# Patient Record
Sex: Female | Born: 1955 | Race: Black or African American | Hispanic: No | Marital: Single | Smoking: Never smoker
Health system: Southern US, Community
[De-identification: ages and names within clinical notes are randomized; demographics above are authoritative.]

## PROBLEM LIST (undated history)

## (undated) DIAGNOSIS — J189 Pneumonia, unspecified organism: Secondary | ICD-10-CM

## (undated) DIAGNOSIS — D869 Sarcoidosis, unspecified: Secondary | ICD-10-CM

## (undated) DIAGNOSIS — F329 Major depressive disorder, single episode, unspecified: Secondary | ICD-10-CM

## (undated) DIAGNOSIS — G43709 Chronic migraine without aura, not intractable, without status migrainosus: Secondary | ICD-10-CM

## (undated) DIAGNOSIS — IMO0002 Reserved for concepts with insufficient information to code with codable children: Secondary | ICD-10-CM

## (undated) DIAGNOSIS — R569 Unspecified convulsions: Secondary | ICD-10-CM

## (undated) DIAGNOSIS — G4730 Sleep apnea, unspecified: Secondary | ICD-10-CM

## (undated) DIAGNOSIS — G8918 Other acute postprocedural pain: Secondary | ICD-10-CM

## (undated) DIAGNOSIS — M1990 Unspecified osteoarthritis, unspecified site: Secondary | ICD-10-CM

## (undated) DIAGNOSIS — T7840XA Allergy, unspecified, initial encounter: Secondary | ICD-10-CM

## (undated) DIAGNOSIS — G894 Chronic pain syndrome: Secondary | ICD-10-CM

## (undated) DIAGNOSIS — E7800 Pure hypercholesterolemia, unspecified: Secondary | ICD-10-CM

## (undated) DIAGNOSIS — J45909 Unspecified asthma, uncomplicated: Secondary | ICD-10-CM

## (undated) DIAGNOSIS — K5900 Constipation, unspecified: Secondary | ICD-10-CM

## (undated) DIAGNOSIS — I10 Essential (primary) hypertension: Secondary | ICD-10-CM

## (undated) DIAGNOSIS — J9610 Chronic respiratory failure, unspecified whether with hypoxia or hypercapnia: Secondary | ICD-10-CM

## (undated) DIAGNOSIS — J449 Chronic obstructive pulmonary disease, unspecified: Secondary | ICD-10-CM

## (undated) DIAGNOSIS — F32A Depression, unspecified: Secondary | ICD-10-CM

## (undated) DIAGNOSIS — I671 Cerebral aneurysm, nonruptured: Secondary | ICD-10-CM

## (undated) DIAGNOSIS — R079 Chest pain, unspecified: Secondary | ICD-10-CM

## (undated) HISTORY — DX: Unspecified osteoarthritis, unspecified site: M19.90

## (undated) HISTORY — DX: Pneumonia, unspecified organism: J18.9

## (undated) HISTORY — PX: BACK SURGERY: SHX140

## (undated) HISTORY — PX: CHOLECYSTECTOMY: SHX55

## (undated) HISTORY — DX: Chronic migraine without aura, not intractable, without status migrainosus: G43.709

## (undated) HISTORY — DX: Unspecified convulsions: R56.9

## (undated) HISTORY — DX: Chest pain, unspecified: R07.9

## (undated) HISTORY — PX: SPINAL FUSION: SHX223

## (undated) HISTORY — DX: Allergy, unspecified, initial encounter: T78.40XA

## (undated) HISTORY — DX: Essential (primary) hypertension: I10

## (undated) HISTORY — DX: Sarcoidosis, unspecified: D86.9

## (undated) HISTORY — DX: Chronic respiratory failure, unspecified whether with hypoxia or hypercapnia: J96.10

## (undated) HISTORY — DX: Reserved for concepts with insufficient information to code with codable children: IMO0002

## (undated) HISTORY — DX: Chronic obstructive pulmonary disease, unspecified: J44.9

## (undated) HISTORY — DX: Cerebral aneurysm, nonruptured: I67.1

## (undated) HISTORY — PX: ABDOMINAL HYSTERECTOMY: SHX81

---
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
## 2015-06-24 DIAGNOSIS — M170 Bilateral primary osteoarthritis of knee: Secondary | ICD-10-CM | POA: Insufficient documentation

## 2015-07-14 DIAGNOSIS — G4700 Insomnia, unspecified: Secondary | ICD-10-CM | POA: Insufficient documentation

## 2015-07-14 DIAGNOSIS — G40909 Epilepsy, unspecified, not intractable, without status epilepticus: Secondary | ICD-10-CM | POA: Insufficient documentation

## 2015-09-17 DIAGNOSIS — M25511 Pain in right shoulder: Secondary | ICD-10-CM

## 2015-09-17 DIAGNOSIS — S46009A Unspecified injury of muscle(s) and tendon(s) of the rotator cuff of unspecified shoulder, initial encounter: Secondary | ICD-10-CM | POA: Insufficient documentation

## 2015-09-17 DIAGNOSIS — G8929 Other chronic pain: Secondary | ICD-10-CM | POA: Insufficient documentation

## 2015-10-05 DIAGNOSIS — M62838 Other muscle spasm: Secondary | ICD-10-CM | POA: Insufficient documentation

## 2015-10-05 DIAGNOSIS — M47817 Spondylosis without myelopathy or radiculopathy, lumbosacral region: Secondary | ICD-10-CM | POA: Insufficient documentation

## 2016-07-19 DIAGNOSIS — G250 Essential tremor: Secondary | ICD-10-CM | POA: Insufficient documentation

## 2016-08-02 DIAGNOSIS — K449 Diaphragmatic hernia without obstruction or gangrene: Secondary | ICD-10-CM | POA: Insufficient documentation

## 2016-08-02 DIAGNOSIS — K219 Gastro-esophageal reflux disease without esophagitis: Secondary | ICD-10-CM | POA: Insufficient documentation

## 2016-11-01 DIAGNOSIS — R079 Chest pain, unspecified: Secondary | ICD-10-CM | POA: Insufficient documentation

## 2016-11-01 HISTORY — DX: Chest pain, unspecified: R07.9

## 2017-01-26 DIAGNOSIS — J439 Emphysema, unspecified: Secondary | ICD-10-CM | POA: Insufficient documentation

## 2017-01-26 DIAGNOSIS — F411 Generalized anxiety disorder: Secondary | ICD-10-CM | POA: Insufficient documentation

## 2017-01-26 DIAGNOSIS — K589 Irritable bowel syndrome without diarrhea: Secondary | ICD-10-CM | POA: Insufficient documentation

## 2017-09-11 DIAGNOSIS — Z9989 Dependence on other enabling machines and devices: Secondary | ICD-10-CM

## 2017-09-11 DIAGNOSIS — G4733 Obstructive sleep apnea (adult) (pediatric): Secondary | ICD-10-CM | POA: Insufficient documentation

## 2017-10-22 DIAGNOSIS — I671 Cerebral aneurysm, nonruptured: Secondary | ICD-10-CM | POA: Insufficient documentation

## 2018-01-17 DIAGNOSIS — D869 Sarcoidosis, unspecified: Secondary | ICD-10-CM | POA: Insufficient documentation

## 2018-01-17 DIAGNOSIS — I671 Cerebral aneurysm, nonruptured: Secondary | ICD-10-CM | POA: Insufficient documentation

## 2018-01-17 DIAGNOSIS — J449 Chronic obstructive pulmonary disease, unspecified: Secondary | ICD-10-CM | POA: Insufficient documentation

## 2018-01-17 DIAGNOSIS — G43709 Chronic migraine without aura, not intractable, without status migrainosus: Secondary | ICD-10-CM | POA: Insufficient documentation

## 2018-01-17 DIAGNOSIS — I10 Essential (primary) hypertension: Secondary | ICD-10-CM | POA: Insufficient documentation

## 2018-01-17 DIAGNOSIS — G40909 Epilepsy, unspecified, not intractable, without status epilepticus: Secondary | ICD-10-CM | POA: Insufficient documentation

## 2018-01-17 DIAGNOSIS — F325 Major depressive disorder, single episode, in full remission: Secondary | ICD-10-CM | POA: Insufficient documentation

## 2018-01-17 DIAGNOSIS — E7800 Pure hypercholesterolemia, unspecified: Secondary | ICD-10-CM | POA: Insufficient documentation

## 2018-01-17 DIAGNOSIS — J9611 Chronic respiratory failure with hypoxia: Secondary | ICD-10-CM | POA: Insufficient documentation

## 2018-01-17 DIAGNOSIS — K5900 Constipation, unspecified: Secondary | ICD-10-CM | POA: Insufficient documentation

## 2018-01-30 DIAGNOSIS — Z8679 Personal history of other diseases of the circulatory system: Secondary | ICD-10-CM | POA: Insufficient documentation

## 2018-01-30 DIAGNOSIS — Z8669 Personal history of other diseases of the nervous system and sense organs: Secondary | ICD-10-CM | POA: Insufficient documentation

## 2018-01-30 DIAGNOSIS — Z8659 Personal history of other mental and behavioral disorders: Secondary | ICD-10-CM | POA: Insufficient documentation

## 2018-01-30 DIAGNOSIS — R519 Headache, unspecified: Secondary | ICD-10-CM | POA: Insufficient documentation

## 2018-01-30 DIAGNOSIS — R51 Headache: Secondary | ICD-10-CM

## 2018-02-01 ENCOUNTER — Other Ambulatory Visit: Payer: Self-pay | Admitting: Acute Care

## 2018-02-01 DIAGNOSIS — I2541 Coronary artery aneurysm: Secondary | ICD-10-CM

## 2018-02-01 DIAGNOSIS — G379 Demyelinating disease of central nervous system, unspecified: Secondary | ICD-10-CM

## 2018-02-04 ENCOUNTER — Ambulatory Visit: Payer: Self-pay

## 2018-02-05 ENCOUNTER — Other Ambulatory Visit: Payer: Self-pay | Admitting: Specialist

## 2018-02-05 DIAGNOSIS — D869 Sarcoidosis, unspecified: Secondary | ICD-10-CM

## 2018-02-07 ENCOUNTER — Ambulatory Visit
Admission: RE | Admit: 2018-02-07 | Discharge: 2018-02-07 | Disposition: A | Discharge status: Home / Self Care | Payer: Medicare Other | Source: Ambulatory Visit | Attending: Nurse Practitioner | Admitting: Nurse Practitioner

## 2018-02-07 ENCOUNTER — Ambulatory Visit: Payer: Medicare Other | Attending: Nurse Practitioner | Admitting: Nurse Practitioner

## 2018-02-07 ENCOUNTER — Other Ambulatory Visit: Payer: Self-pay | Admitting: Nurse Practitioner

## 2018-02-07 ENCOUNTER — Encounter: Payer: Self-pay | Admitting: Nurse Practitioner

## 2018-02-07 ENCOUNTER — Other Ambulatory Visit
Admission: RE | Admit: 2018-02-07 | Discharge: 2018-02-07 | Disposition: A | Discharge status: Home / Self Care | Payer: Medicare Other | Source: Ambulatory Visit | Attending: Nurse Practitioner | Admitting: Nurse Practitioner

## 2018-02-07 ENCOUNTER — Other Ambulatory Visit: Payer: Self-pay

## 2018-02-07 VITALS — BP 147/95 | HR 106 | Temp 98.9°F | Resp 18 | Ht 64.0 in | Wt 172.0 lb

## 2018-02-07 DIAGNOSIS — M5441 Lumbago with sciatica, right side: Principal | ICD-10-CM

## 2018-02-07 DIAGNOSIS — Z789 Other specified health status: Secondary | ICD-10-CM

## 2018-02-07 DIAGNOSIS — M899 Disorder of bone, unspecified: Secondary | ICD-10-CM

## 2018-02-07 DIAGNOSIS — M47896 Other spondylosis, lumbar region: Secondary | ICD-10-CM | POA: Insufficient documentation

## 2018-02-07 DIAGNOSIS — G40909 Epilepsy, unspecified, not intractable, without status epilepticus: Secondary | ICD-10-CM | POA: Insufficient documentation

## 2018-02-07 DIAGNOSIS — Z79899 Other long term (current) drug therapy: Secondary | ICD-10-CM

## 2018-02-07 DIAGNOSIS — D869 Sarcoidosis, unspecified: Secondary | ICD-10-CM | POA: Diagnosis not present

## 2018-02-07 DIAGNOSIS — G43709 Chronic migraine without aura, not intractable, without status migrainosus: Secondary | ICD-10-CM | POA: Insufficient documentation

## 2018-02-07 DIAGNOSIS — J449 Chronic obstructive pulmonary disease, unspecified: Secondary | ICD-10-CM | POA: Diagnosis not present

## 2018-02-07 DIAGNOSIS — K219 Gastro-esophageal reflux disease without esophagitis: Secondary | ICD-10-CM | POA: Insufficient documentation

## 2018-02-07 DIAGNOSIS — M79605 Pain in left leg: Secondary | ICD-10-CM | POA: Diagnosis not present

## 2018-02-07 DIAGNOSIS — M25562 Pain in left knee: Secondary | ICD-10-CM | POA: Diagnosis not present

## 2018-02-07 DIAGNOSIS — M5481 Occipital neuralgia: Secondary | ICD-10-CM | POA: Diagnosis not present

## 2018-02-07 DIAGNOSIS — M47816 Spondylosis without myelopathy or radiculopathy, lumbar region: Secondary | ICD-10-CM | POA: Diagnosis not present

## 2018-02-07 DIAGNOSIS — Z9981 Dependence on supplemental oxygen: Secondary | ICD-10-CM | POA: Diagnosis not present

## 2018-02-07 DIAGNOSIS — E7800 Pure hypercholesterolemia, unspecified: Secondary | ICD-10-CM | POA: Diagnosis not present

## 2018-02-07 DIAGNOSIS — M79604 Pain in right leg: Secondary | ICD-10-CM | POA: Diagnosis not present

## 2018-02-07 DIAGNOSIS — M25511 Pain in right shoulder: Secondary | ICD-10-CM

## 2018-02-07 DIAGNOSIS — G8929 Other chronic pain: Secondary | ICD-10-CM | POA: Insufficient documentation

## 2018-02-07 DIAGNOSIS — M533 Sacrococcygeal disorders, not elsewhere classified: Secondary | ICD-10-CM | POA: Insufficient documentation

## 2018-02-07 DIAGNOSIS — M62838 Other muscle spasm: Secondary | ICD-10-CM | POA: Diagnosis not present

## 2018-02-07 DIAGNOSIS — M25512 Pain in left shoulder: Secondary | ICD-10-CM

## 2018-02-07 DIAGNOSIS — G4733 Obstructive sleep apnea (adult) (pediatric): Secondary | ICD-10-CM | POA: Insufficient documentation

## 2018-02-07 DIAGNOSIS — J9611 Chronic respiratory failure with hypoxia: Secondary | ICD-10-CM | POA: Insufficient documentation

## 2018-02-07 DIAGNOSIS — Z885 Allergy status to narcotic agent status: Secondary | ICD-10-CM | POA: Insufficient documentation

## 2018-02-07 DIAGNOSIS — G894 Chronic pain syndrome: Secondary | ICD-10-CM | POA: Insufficient documentation

## 2018-02-07 DIAGNOSIS — M7592 Shoulder lesion, unspecified, left shoulder: Secondary | ICD-10-CM | POA: Insufficient documentation

## 2018-02-07 DIAGNOSIS — M542 Cervicalgia: Secondary | ICD-10-CM | POA: Diagnosis not present

## 2018-02-07 DIAGNOSIS — M5136 Other intervertebral disc degeneration, lumbar region: Secondary | ICD-10-CM | POA: Diagnosis not present

## 2018-02-07 DIAGNOSIS — G4700 Insomnia, unspecified: Secondary | ICD-10-CM | POA: Diagnosis not present

## 2018-02-07 DIAGNOSIS — Z79891 Long term (current) use of opiate analgesic: Secondary | ICD-10-CM | POA: Diagnosis not present

## 2018-02-07 DIAGNOSIS — Z7982 Long term (current) use of aspirin: Secondary | ICD-10-CM | POA: Insufficient documentation

## 2018-02-07 DIAGNOSIS — Z5181 Encounter for therapeutic drug level monitoring: Secondary | ICD-10-CM | POA: Insufficient documentation

## 2018-02-07 DIAGNOSIS — G250 Essential tremor: Secondary | ICD-10-CM | POA: Insufficient documentation

## 2018-02-07 DIAGNOSIS — Z880 Allergy status to penicillin: Secondary | ICD-10-CM | POA: Insufficient documentation

## 2018-02-07 DIAGNOSIS — K589 Irritable bowel syndrome without diarrhea: Secondary | ICD-10-CM | POA: Insufficient documentation

## 2018-02-07 DIAGNOSIS — F325 Major depressive disorder, single episode, in full remission: Secondary | ICD-10-CM | POA: Insufficient documentation

## 2018-02-07 DIAGNOSIS — M25561 Pain in right knee: Secondary | ICD-10-CM | POA: Diagnosis not present

## 2018-02-07 DIAGNOSIS — M170 Bilateral primary osteoarthritis of knee: Secondary | ICD-10-CM | POA: Insufficient documentation

## 2018-02-07 DIAGNOSIS — Z7951 Long term (current) use of inhaled steroids: Secondary | ICD-10-CM | POA: Insufficient documentation

## 2018-02-07 DIAGNOSIS — F411 Generalized anxiety disorder: Secondary | ICD-10-CM | POA: Insufficient documentation

## 2018-02-07 DIAGNOSIS — Z888 Allergy status to other drugs, medicaments and biological substances status: Secondary | ICD-10-CM | POA: Insufficient documentation

## 2018-02-07 LAB — COMPREHENSIVE METABOLIC PANEL
ALBUMIN: 4.8 g/dL (ref 3.5–5.0)
ALK PHOS: 63 U/L (ref 38–126)
ALT: 18 U/L (ref 14–54)
AST: 19 U/L (ref 15–41)
Anion gap: 11 (ref 5–15)
BILIRUBIN TOTAL: 0.5 mg/dL (ref 0.3–1.2)
BUN: 15 mg/dL (ref 6–20)
CO2: 25 mmol/L (ref 22–32)
CREATININE: 0.61 mg/dL (ref 0.44–1.00)
Calcium: 9.6 mg/dL (ref 8.9–10.3)
Chloride: 103 mmol/L (ref 101–111)
GFR calc Af Amer: >60 mL/min (ref >60)
GFR calc non Af Amer: >60 mL/min (ref >60)
GLUCOSE: 87 mg/dL (ref 65–99)
Potassium: 3.6 mmol/L (ref 3.5–5.1)
SODIUM: 139 mmol/L (ref 135–145)
Total Protein: 8.8 g/dL — ABNORMAL HIGH (ref 6.5–8.1)

## 2018-02-07 LAB — VITAMIN B12: VITAMIN B 12: 268 pg/mL (ref 180–914)

## 2018-02-07 LAB — C-REACTIVE PROTEIN

## 2018-02-07 LAB — MAGNESIUM: MAGNESIUM: 2.2 mg/dL (ref 1.7–2.4)

## 2018-02-07 LAB — SEDIMENTATION RATE: SED RATE: 53 mm/h — AB (ref 0–30)

## 2018-02-07 NOTE — Progress Notes (Signed)
Patient's Name: Jean Gomez  MRN: [id number]  Referring Provider: Kirk Ruths, MD  DOB: [birth year]  PCP: Kirk Ruths, MD  DOS: 02/07/2018  Note by: Dionisio David NP  Service setting: Ambulatory outpatient  Specialty: Interventional Pain Management  Location: [facility] (AMB) Pain Management Facility    Patient type: New Patient    Primary Reason(s) for Visit: Initial Patient Evaluation CC: Back Pain (lower); Headache; Shoulder Pain (bilateral); and Knee Pain (bilateral)  HPI  [name] is a 62 y.o. year old, female patient, who comes today for an initial evaluation. She has Chronic pain syndrome; Pharmacologic therapy; Disorder of skeletal system; Problems influencing health status; Long term current use of opiate analgesic; Chronic bilateral low back pain with right-sided sciatica (Secondary Area of Pain); Chronic pain of both lower extremities  (R>L); Chronic pain of both knees (Tertiary Area of Pain) (R>L); Chronic pain of both shoulders(Fourth Area of Pain) (R>L); Occipital neuralgia of right side (Primary Area of Pain); Chronic neck pain (Bilateral) (R>L); Chronic right sacroiliac joint pain; Chest pain; Chronic migraine without aura; Chronic respiratory failure with hypoxia ([place]); Constipation; Epilepsy ([place]); HTN, goal below 140/90; Essential tremor; Generalized anxiety disorder; H/O aneurysm; GERD (gastroesophageal reflux disease); History of depression; History of seizure disorder; Hypercholesterolemia; Insomnia; Irritable bowel syndrome; Major depression in remission ([place]); Muscle spasm; Obstructive sleep apnea on CPAP; Osteoarthritis; Primary osteoarthritis of both knees; COPD (chronic obstructive pulmonary disease) ([place]); Rotator cuff injury; Sarcoid; and Spondylosis of lumbar region without myelopathy or radiculopathy on their problem list.. Her primarily concern today is the Back Pain (lower); Headache; Shoulder Pain (bilateral); and Knee Pain (bilateral)  Pain  Assessment: Location: Lower Back(both shoulders, knees, head) Radiating: back pain radiates down both legs Onset: More than a month ago Duration: Chronic pain Quality: Aching, Stabbing(pulling) Severity: 10-Worst pain ever/10 (self-reported pain score)  Note: Reported level is compatible with observation.                         When using our objective Pain Scale, levels between 6 and 10/10 are said to belong in an emergency room, as it progressively worsens from a 6/10, described as severely limiting, requiring emergency care not usually available at an outpatient pain management facility. At a 6/10 level, communication becomes difficult and requires great effort. Assistance to reach the emergency department may be required. Facial flushing and profuse sweating along with potentially dangerous increases in heart rate and blood pressure will be evident. Effect on ADL:   Timing: Constant Modifying factors: not moving  Onset and Duration: Present longer than 3 months Cause of pain: Unknown Severity: Getting worse, NAS-11 at its worse: 10/10, NAS-11 at its best: 10/10, NAS-11 now: 10/10 and NAS-11 on the average: 10/10 Timing: Morning, Afternoon, Night, During activity or exercise, After activity or exercise and After a period of immobility Aggravating Factors: Bending, Climbing, Kneeling, Lifiting, Prolonged sitting, Prolonged standing, Squatting, Stooping , Twisting, Walking, Walking uphill, Walking downhill and Working Alleviating Factors: Medications, Resting, Sitting and Warm showers or baths Associated Problems: Constipation, Day-time cramps, Night-time cramps, Depression, Dizziness, Nausea, Numbness, Sadness, Swelling, Tingling, Weakness, Pain that wakes patient up and Pain that does not allow patient to sleep Quality of Pain: Aching, Agonizing, Annoying, Burning, Constant, Intermittent, Cramping, Deep, Disabling, Distressing, Dreadful, Dull, Exhausting, Getting longer, Heavy, Hot, Itching,  Nagging, Pressure-like, Sharp, Sickening, Stabbing, Tender, Throbbing, Tingling, Tiring and Uncomfortable Previous Examinations or Tests: X-rays and Neurological evaluation Previous Treatments: Physical Therapy, Pool exercises, Relaxation therapy,  Steroid treatments by mouth and Stretching exercises  The patient comes into the clinics today for the first time for a chronic pain management evaluation. According to the patient her primary area of pain is in her head. She has been diagnosed with cerebral medial aneurysm unruptured. She has been diagnosed with seizure disorder. She has chronic headaches. She admits that his right occipital area with occasional left frontal. She denies any previous surgery. She admits that [place]. She has had a recent CT of head and neck.  Her second area of pain in her lower back. She admits that the pain is midline. She does have pain that goes down the leg. She denies any previous surgeries. She admits that she has had interventional therapy [facility] in [place] which was effective. She admits that she has also had acupuncture. She denies any recent images.  Her third area of pain is in her lower extremities. She admits that the pain radiates down the back of her right leg into her foot. She has numbness tingling and weakness.  Her fourth area of pain is in her knees. She admits the right side is greater than the left. He denies any previous surgeries. She has had steroid injections which were effective. She does home physical therapy she denies any recent images. She does wearing bilateral knee braces.  Her third area of pain is in her shoulders. She admits the right is greater than the left she denies any previous surgeries. She has had interventional therapy which is effective last being approximately 6 months ago. She denies any recent images.  Today I took the time to provide the patient with information regarding this pain  practice. The patient was informed that the practice is divided into two sections: an interventional pain management section, as well as a completely separate and distinct medication management section. I explained that there are procedure days for interventional therapies, and evaluation days for follow-ups and medication management. Because of the amount of documentation required during both, they are kept separated. This means that there is the possibility that she may be scheduled for a procedure on one day, and medication management the next. I have also informed her that because of staffing and facility limitations, this practice will no longer take patients for medication management only. To illustrate the reasons for this, I gave the patient the example of surgeons, and how inappropriate it would be to refer a patient to his/her care, just to write for the post-surgical antibiotics on a surgery done by a different surgeon.   Because interventional pain management is part of the board-certified specialty for the doctors, the patient was informed that joining this practice means that they are open to any and all interventional therapies. I made it clear that this does not mean that they will be forced to have any procedures done. What this means is that I believe interventional therapies to be essential part of the diagnosis and proper management of chronic pain conditions. Therefore, patients not interested in these interventional alternatives will be better served under the care of a different practitioner.  The patient was also made aware of my Comprehensive Pain Management Safety Guidelines where by joining this practice, they limit all of their nerve blocks and joint injections to those done by our practice, for as long as we are retained to manage their care. Historic Controlled Substance Pharmacotherapy Review  PMP and historical list of controlled substances: Hydrocodone/acetaminophen 10/325,  clonazepam 1 mg, Fioricet 50 mg Highest opioid analgesic regimen found: Hydrocodone/acetaminophen 10/325 4 times a day (fill date 05/12/2015) hydrocodone 40 mg Most recent opioid analgesic: Hydrocodone/acetaminophen 10/325 3 times daily (fill date 11/19/2017) hydrocodone 30 mg per day Current opioid analgesics: Hydrocodone/acetaminophen 10/325 3 times daily (fill date 11/19/2017) hydrocodone 30 mg per day Highest recorded MME/day: 40 mg/day MME/day: 30 mg/day Medications: The patient did not bring the medication(s) to the appointment, as requested in our "New Patient Package" Pharmacodynamics: Desired effects: Analgesia: The patient reports >50% benefit. Reported improvement in function: The patient reports medication allows her to accomplish basic ADLs. Clinically meaningful improvement in function (CMIF): Sustained CMIF goals met Perceived effectiveness: Described as relatively effective, allowing for increase in activities of daily living (ADL) Undesirable effects: Side-effects or Adverse reactions: None reported Historical Monitoring: The patient  reports that she does not use drugs. List of all UDS Test(s): No results found for: MDMA, COCAINSCRNUR, PCPSCRNUR, PCPQUANT, CANNABQUANT, THCU, [place] List of all Serum Drug Screening Test(s):  No results found for: AMPHSCRSER, BARBSCRSER, BENZOSCRSER, COCAINSCRSER, PCPSCRSER, PCPQUANT, THCSCRSER, CANNABQUANT, OPIATESCRSER, OXYSCRSER, PROPOXSCRSER Historical Background Evaluation: [place] PDMP: Six (6) year initial data search conducted.              Department of public safety, offender search: Editor, commissioning Information) Non-contributory Risk Assessment Profile: Aberrant behavior: None observed or detected today Risk factors for fatal opioid overdose: None identified today Fatal overdose hazard ratio (HR): Calculation deferred Non-fatal overdose hazard ratio (HR): Calculation deferred Risk of opioid abuse or dependence: 0.7-3.0% with doses ? 36  MME/day and 6.1-26% with doses ? 120 MME/day. Substance use disorder (SUD) risk level: Pending results of Medical Psychology Evaluation for SUD Opioid risk tool (ORT) (Total Score): 1  ORT Scoring interpretation table:  Score <3 = Low Risk for SUD  Score between 4-7 = Moderate Risk for SUD  Score >8 = High Risk for Opioid Abuse   PHQ-2 Depression Scale:  Total score: 0  PHQ-2 Scoring interpretation table: (Score and probability of major depressive disorder)  Score 0 = No depression  Score 1 = 15.4% Probability  Score 2 = 21.1% Probability  Score 3 = 38.4% Probability  Score 4 = 45.5% Probability  Score 5 = 56.4% Probability  Score 6 = 78.6% Probability   PHQ-9 Depression Scale:  Total score: 0  PHQ-9 Scoring interpretation table:  Score 0-4 = No depression  Score 5-9 = Mild depression  Score 10-14 = Moderate depression  Score 15-19 = Moderately severe depression  Score 20-27 = Severe depression (2.4 times higher risk of SUD and 2.89 times higher risk of overuse)   Pharmacologic Plan: Pending ordered tests and/or consults  Meds  The patient has a current medication list which includes the following prescription(s): albuterol, albuterol sulfate, alum hydroxide-mag trisilicate, amlodipine, aspirin ec, aspirin-calcium carbonate, atorvastatin, betamethasone (augmented), budesonide-formoterol, clotrimazole-betamethasone, diclofenac sodium, escitalopram, hydrochlorothiazide, hydrocodone-acetaminophen, hydroxyzine, ipratropium, ketoconazole, levetiracetam, losartan, lubiprostone, mirtazapine, nitroglycerin, pantoprazole, potassium chloride sa, sucralfate, sumatriptan, tizanidine, topiramate, and trazodone.  Current Outpatient Medications on File Prior to Visit  Medication Sig  . albuterol (PROVENTIL) (2.5 MG/3ML) 0.083% nebulizer solution Take 2.5 mg by nebulization every 4 (four) hours as needed for wheezing or shortness of breath.  . Albuterol Sulfate 108 (90 Base) MCG/ACT AEPB Inhale  2 puffs into the lungs every 6 (six) hours as needed.  Marland Kitchen alum hydroxide-mag trisilicate (GAVISCON) 29-51 MG CHEW chewable tablet Chew by mouth as needed for indigestion or heartburn.  Marland Kitchen amLODipine (NORVASC) 10 MG tablet Take  10 mg by mouth daily.  Marland Kitchen aspirin EC 81 MG tablet Take 81 mg by mouth daily.  . Aspirin-Calcium Carbonate 81-777 MG TABS Take by mouth as needed.  Marland Kitchen atorvastatin (LIPITOR) 10 MG tablet Take 10 mg by mouth daily.  . betamethasone, augmented, (DIPROLENE) 0.05 % lotion Apply topically 2 (two) times daily.  . budesonide-formoterol (SYMBICORT) 160-4.5 MCG/ACT inhaler Inhale 2 puffs into the lungs 2 (two) times daily.  . clotrimazole-betamethasone (LOTRISONE) cream Apply 1 application topically 2 (two) times daily.  . diclofenac sodium (VOLTAREN) 1 % GEL Apply 2 g topically 4 (four) times daily.  Marland Kitchen escitalopram (LEXAPRO) 20 MG tablet Take 20 mg by mouth daily.  . hydrochlorothiazide (HYDRODIURIL) 25 MG tablet Take 25 mg by mouth daily.  Marland Kitchen HYDROcodone-acetaminophen (NORCO) 10-325 MG tablet Take 1 tablet by mouth 3 (three) times daily as needed.  . hydrOXYzine (ATARAX/VISTARIL) 10 MG tablet Take 10 mg by mouth 3 (three) times daily as needed.  Marland Kitchen ipratropium (ATROVENT) 0.02 % nebulizer solution Take 0.5 mg by nebulization every 4 (four) hours as needed for wheezing or shortness of breath.  . ketoconazole (NIZORAL) 2 % shampoo Apply 1 application topically 2 (two) times a week.  . levETIRAcetam (KEPPRA) 750 MG tablet Take 750 mg by mouth daily.  Marland Kitchen losartan (COZAAR) 25 MG tablet Take 25 mg by mouth daily.  Marland Kitchen lubiprostone (AMITIZA) 24 MCG capsule Take 24 mcg by mouth 2 (two) times daily with a meal.  . mirtazapine (REMERON) 15 MG tablet Take 15 mg by mouth at bedtime.  . nitroGLYCERIN (NITROSTAT) 0.4 MG SL tablet Place 0.4 mg under the tongue every 5 (five) minutes as needed for chest pain.  . pantoprazole (PROTONIX) 40 MG tablet Take 40 mg by mouth daily.  . potassium chloride SA  (K-DUR,KLOR-CON) 20 MEQ tablet Take 20 mEq by mouth 2 (two) times daily.  . sucralfate (CARAFATE) 1 g tablet Take 1 g by mouth 4 (four) times daily.  . SUMAtriptan (IMITREX) 25 MG tablet Take 25 mg by mouth every 2 (two) hours as needed for migraine. May repeat in 2 hours if headache persists or recurs.  Marland Kitchen tiZANidine (ZANAFLEX) 4 MG tablet Take 4 mg by mouth 3 (three) times daily.  Marland Kitchen topiramate (TOPAMAX) 50 MG tablet Take 50 mg by mouth 2 (two) times daily.  . traZODone (DESYREL) 50 MG tablet Take 50 mg by mouth at bedtime.   No current facility-administered medications on file prior to visit.    Imaging Review   Note: No new results found.       CT of cervical spine in Care Everywhere  ROS  Cardiovascular History: Heart trouble, Daily Aspirin intake, High blood pressure and Chest pain Pulmonary or Respiratory History: Lung problems, Shortness of breath, Snoring  and Sarcoidosis Neurological History: Seizure disorder and Curved spine Review of Past Neurological Studies: No results found for this or any previous visit. Psychological-Psychiatric History: Anxiousness and Depressed Gastrointestinal History: No reported gastrointestinal signs or symptoms such as vomiting or evacuating blood, reflux, heartburn, alternating episodes of diarrhea and constipation, inflamed or scarred liver, or pancreas or irrregular and/or infrequent bowel movements Genitourinary History: No reported renal or genitourinary signs or symptoms such as difficulty voiding or producing urine, peeing blood, non-functioning kidney, kidney stones, difficulty emptying the bladder, difficulty controlling the flow of urine, or chronic kidney disease Hematological History: No reported hematological signs or symptoms such as prolonged bleeding, low or poor functioning platelets, bruising or bleeding easily, hereditary bleeding problems, low energy  levels due to low hemoglobin or being anemic Endocrine History: No reported endocrine  signs or symptoms such as high or low blood sugar, rapid heart rate due to high thyroid levels, obesity or weight gain due to slow thyroid or thyroid disease Rheumatologic History: Joint aches and or swelling due to excess weight (Osteoarthritis) Musculoskeletal History: Negative for myasthenia gravis, muscular dystrophy, multiple sclerosis or malignant hyperthermia Work History: Disabled  Allergies  Ms. Lundy is allergic to gabapentin; labetalol; sulfabenzamide; fentanyl; and penicillins.  Laboratory Chemistry  Inflammation Markers No results found for: CRP, ESRSEDRATE (CRP: Acute Phase) (ESR: Chronic Phase) Renal Function Markers No results found for: BUN, CREATININE, GFRAA, GFRNONAA Hepatic Function Markers No results found for: AST, ALT, ALBUMIN, ALKPHOS, HCVAB Electrolytes No results found for: NA, K, CL, CALCIUM, MG Neuropathy Markers No results found for: [id number] Bone Pathology Markers No results found for: Hendricks Milo, VD125OH2TOT, [id number], [id number], 25OHVITD1, 25OHVITD2, 25OHVITD3, CALCIUM, TESTOFREE, TESTOSTERONE Coagulation Parameters No results found for: INR, LABPROT, APTT, PLT Cardiovascular Markers No results found for: BNP, HGB, HCT Note: Lab results reviewed.  [place]  Drug: Ms. Sigmund  reports that she does not use drugs. Alcohol:  reports that she drinks alcohol. Tobacco:  reports that  has never smoked. she has never used smokeless tobacco. Medical:  has a past medical history of Cerebral aneurysm, Chronic migraine, Chronic respiratory failure ([place]), COPD (chronic obstructive pulmonary disease) ([place]), Hypertension, Osteoarthritis, Sarcoid, and Seizures ([place]). Family: family history is not on file.   Active Ambulatory Problems    Diagnosis Date Noted  . Chronic pain syndrome 02/07/2018  . Pharmacologic therapy 02/07/2018  . Disorder of skeletal system 02/07/2018  . Problems influencing health status 02/07/2018  . Long term current use of opiate  analgesic 02/07/2018  . Chronic bilateral low back pain with right-sided sciatica (Secondary Area of Pain) 02/07/2018  . Chronic pain of both lower extremities  (R>L) 02/07/2018  . Chronic pain of both knees [facility] Area of Pain) (R>L) 02/07/2018  . Chronic pain of both shoulders(Fourth Area of Pain) (R>L) 02/07/2018  . Occipital neuralgia of right side (Primary Area of Pain) 02/07/2018  . Chronic neck pain (Bilateral) (R>L) 02/07/2018  . Chronic right sacroiliac joint pain 02/07/2018  . Chest pain 11/01/2016  . Chronic migraine without aura 01/17/2018  . Chronic respiratory failure with hypoxia ([place]) 01/17/2018  . Constipation 01/17/2018  . Epilepsy ([place]) 07/14/2015  . HTN, goal below 140/90 01/17/2018  . Essential tremor 07/19/2016  . Generalized anxiety disorder 01/26/2017  . H/O aneurysm 01/30/2018  . GERD (gastroesophageal reflux disease) 08/02/2016  . History of depression 01/30/2018  . History of seizure disorder 01/30/2018  . Hypercholesterolemia 01/17/2018  . Insomnia 07/14/2015  . Irritable bowel syndrome 01/26/2017  . Major depression in remission ([place]) 01/17/2018  . Muscle spasm 10/05/2015  . Obstructive sleep apnea on CPAP 09/11/2017  . Osteoarthritis 05/27/2015  . Primary osteoarthritis of both knees 06/24/2015  . COPD (chronic obstructive pulmonary disease) ([place]) 01/17/2018  . Rotator cuff injury 09/17/2015  . Sarcoid 01/17/2018  . Spondylosis of lumbar region without myelopathy or radiculopathy 10/05/2015   Resolved Ambulatory Problems    Diagnosis Date Noted  . No Resolved Ambulatory Problems   Past Medical History:  Diagnosis Date  . Cerebral aneurysm   . Chronic migraine   . Chronic respiratory failure ([place])   . COPD (chronic obstructive pulmonary disease) ([place])   . Hypertension   . Osteoarthritis   . Sarcoid   . Seizures ([place])  Constitutional Exam  General appearance: Well nourished, well developed, and well hydrated. In no apparent acute  distress Vitals:   02/07/18 1108  BP: (!) 147/95  Pulse: (!) 106  Resp: 18  Temp: 98.9 F (37.2 C)  TempSrc: Oral  SpO2: 100%  Weight: 172 lb (78 kg)  Height: 5' 4" (1.626 m)   BMI Assessment: Estimated body mass index is 29.52 kg/m as calculated from the following:   Height as of this encounter: 5' 4" (1.626 m).   Weight as of this encounter: 172 lb (78 kg).  BMI interpretation table: BMI level Category Range association with higher incidence of chronic pain  <18 kg/m2 Underweight   18.5-24.9 kg/m2 Ideal body weight   25-29.9 kg/m2 Overweight Increased incidence by 20%  30-34.9 kg/m2 Obese (Class I) Increased incidence by 68%  35-39.9 kg/m2 Severe obesity (Class II) Increased incidence by 136%  >40 kg/m2 Extreme obesity (Class III) Increased incidence by 254%   BMI Readings from Last 4 Encounters:  02/07/18 29.52 kg/m   Wt Readings from Last 4 Encounters:  02/07/18 172 lb (78 kg)  Psych/Mental status: Alert, oriented x 3 (person, place, & time)       Eyes: PERLA Respiratory: Oxygen-dependent COPD  Cervical Spine Exam  Inspection: No masses, redness, or swelling Alignment: Symmetrical Functional ROM: Unrestricted ROM      Stability: No instability detected Muscle strength & Tone: Functionally intact Sensory: Unimpaired Palpation: No palpable anomalies              Upper Extremity (UE) Exam    Side: Right upper extremity  Side: Left upper extremity  Inspection: No masses, redness, swelling, or asymmetry. No contractures  Inspection: No masses, redness, swelling, or asymmetry. No contractures  Functional ROM: Decreased ROM          Functional ROM: Decreased ROM          Muscle strength & Tone: Functionally intact  Muscle strength & Tone: Functionally intact  Sensory: Unimpaired  Sensory: Unimpaired  Palpation: Complains of area being tender to palpation              Palpation: Complains of area being tender to palpation              Specialized Test(s): Deferred          Specialized Test(s): Deferred          Thoracic Spine Exam  Inspection: No masses, redness, or swelling Alignment: Symmetrical Functional ROM: Unrestricted ROM Stability: No instability detected Sensory: Unimpaired Muscle strength & Tone: No palpable anomalies  Lumbar Spine Exam  Inspection: No masses, redness, or swelling Alignment: Symmetrical Functional ROM: Restricted ROM      Stability: No instability detected Muscle strength & Tone: Functionally intact Sensory: Unimpaired Palpation: Complains of area being tender to palpation       Provocative Tests: Lumbar Hyperextension and rotation test: Positive bilaterally for facet joint pain. Patrick's Maneuver: Positive for right-sided S-I arthralgia              Gait & Posture Assessment  Ambulation: Patient ambulates using a cane Gait: Relatively normal for age and body habitus Posture: WNL   Lower Extremity Exam    Side: Right lower extremity  Side: Left lower extremity  Inspection: Edema brace worn  Inspection: Edema brace warn  Functional ROM: Decreased ROM for knee joint  Functional ROM: Decreased ROM for knee joint  Muscle strength & Tone: Movement possible against gravity, but not against resistance (3/5)  Muscle strength & Tone: Movement possible against gravity, but not against resistance (3/5)  Sensory: Unimpaired  Sensory: Unimpaired  Palpation: Tender  Palpation: Tender   Assessment  Primary Diagnosis & Pertinent Problem List: The primary encounter diagnosis was Occipital neuralgia of right side (Primary Area of Pain). Diagnoses of Chronic bilateral low back pain with right-sided sciatica (Secondary Area of Pain), Chronic pain of both knees (Fourth Area of Pain) (R>L), Chronic pain of both shoulders (R>L), Chronic pain of both lower extremities, Chronic neck pain (Bilateral) (R>L), Chronic pain syndrome, Disorder of skeletal system, Pharmacologic therapy, Problems influencing health status, Long term current use of  opiate analgesic, and Chronic right sacroiliac joint pain were also pertinent to this visit.  Visit Diagnosis: 1. Occipital neuralgia of right side (Primary Area of Pain)   2. Chronic bilateral low back pain with right-sided sciatica (Secondary Area of Pain)   3. Chronic pain of both knees (Fourth Area of Pain) (R>L)   4. Chronic pain of both shoulders (R>L)   5. Chronic pain of both lower extremities   6. Chronic neck pain (Bilateral) (R>L)   7. Chronic pain syndrome   8. Disorder of skeletal system   9. Pharmacologic therapy   10. Problems influencing health status   11. Long term current use of opiate analgesic   12. Chronic right sacroiliac joint pain    Plan of Care  Initial treatment plan:  Please be advised that as per protocol, today's visit has been an evaluation only. We have not taken over the patient's controlled substance management.  Problem-specific plan: No problem-specific Assessment & Plan notes found for this encounter.  Ordered Lab-work, Procedure(s), Referral(s), & Consult(s): Orders Placed This Encounter  Procedures  . DG Lumbar Spine Complete W/Bend  . DG Shoulder Right  . DG Shoulder Left  . DG Knee 1-2 Views Left  . DG Knee 1-2 Views Right  . DG Si Joints  . Compliance Drug Analysis, Ur  . Comp. Metabolic Panel (12)  . Magnesium  . Vitamin B12  . Sedimentation rate  . 25-Hydroxyvitamin D Lcms D2+D3  . C-reactive protein  . Ambulatory referral to Psychology   Pharmacotherapy: Medications ordered:  No orders of the defined types were placed in this encounter.  Medications administered during this visit: Angie Fava had no medications administered during this visit.   Pharmacotherapy under consideration:  Opioid Analgesics: The patient was informed that there is no guarantee that she would be a candidate for opioid analgesics. The decision will be made following CDC guidelines. This decision will be based on the results of diagnostic studies,  as well as Ms. Decoteau's risk profile.  Membrane stabilizer: To be determined at a later time Muscle relaxant: To be determined at a later time NSAID: To be determined at a later time Other analgesic(s): To be determined at a later time . She would like assistance with a back brace   Interventional therapies under consideration: Ms. Bents was informed that there is no guarantee that she would be a candidate for interventional therapies. The decision will be based on the results of diagnostic studies, as well as Ms. Sebald's risk profile.  Possible procedure(s): Possible diagnostic right sided occipital nerve block Diagnostic bilateral LESI Diagnostic bilateral lumbar facet nerve block Possible bilateral lumbar facet RFA Diagnostic bilateral intra-articular knee injection Diagnostic bilateral intra-articular shoulder injection Diagnostic bilateral suprascapular nerve block Possible bilateral suprascapular RFA Hyalgan series    Provider-requested follow-up: Return for 2nd Visit, w/ Dr. Dossie Arbour, after [facility]  eval.  Future Appointments  Date Time Provider [place]  02/11/2018  2:30 PM [facility]  02/18/2018 10:00 AM [id number]    Primary Care Physician: Kirk Ruths, MD Location: [facility] Facility Note by:  Date: 02/07/2018; Time: 2:26 PM  Pain Score Disclaimer: We use the NRS-11 scale. This is a self-reported, subjective measurement of pain severity with only modest accuracy. It is used primarily to identify changes within a particular patient. It must be understood that outpatient pain scales are significantly less accurate that those used for research, where they can be applied under ideal controlled circumstances with minimal exposure to variables. In reality, the score is likely to be a combination of pain intensity and pain affect, where pain affect describes the degree of emotional arousal or changes in action  readiness caused by the sensory experience of pain. Factors such as social and work situation, setting, emotional state, anxiety levels, expectation, and prior pain experience may influence pain perception and show large inter-individual differences that may also be affected by time variables.  Patient instructions provided during this appointment: Patient Instructions    ____________________________________________________________________________________________  Appointment Policy Summary  It is our goal and responsibility to provide the medical community with assistance in the evaluation and management of patients with chronic pain. Unfortunately our resources are limited. Because we do not have an unlimited amount of time, or available appointments, we are required to closely monitor and manage their use. The following rules exist to maximize their use:  Patient's responsibilities: 1. Punctuality:  At what time should I arrive? You should be physically present in our office 30 minutes before your scheduled appointment. Your scheduled appointment is with your assigned healthcare provider. However, it takes 5-10 minutes to be "checked-in", and another 15 minutes for the nurses to do the admission. If you arrive to our office at the time you were given for your appointment, you will end up being at least 20-25 minutes late to your appointment with the provider. 2. Tardiness:  What happens if I arrive only a few minutes after my scheduled appointment time? You will need to reschedule your appointment. The cutoff is your appointment time. This is why it is so important that you arrive at least 30 minutes before that appointment. If you have an appointment scheduled for 10:00 AM and you arrive at 10:01, you will be required to reschedule your appointment.  3. Plan ahead:  Always assume that you will encounter traffic on your way in. Plan for it. If you are dependent on a driver, make sure they  understand these rules and the need to arrive early. 4. Other appointments and responsibilities:  Avoid scheduling any other appointments before or after your pain clinic appointments.  5. Be prepared:  Write down everything that you need to discuss with your healthcare provider and give this information to the admitting nurse. Write down the medications that you will need refilled. Bring your pills and bottles (even the empty ones), to all of your appointments, except for those where a procedure is scheduled. 6. No children or pets:  Find someone to take care of them. It is not appropriate to bring them in. 7. Scheduling changes:  We request "advanced notification" of any changes or cancellations. 8. Advanced notification:  Defined as a time period of more than 24 hours prior to the originally scheduled appointment. This allows for the appointment to be offered to other patients. 9. Rescheduling:  When a  visit is rescheduled, it will require the cancellation of the original appointment. For this reason they both fall within the category of "Cancellations".  10. Cancellations:  They require advanced notification. Any cancellation less than 24 hours before the  appointment will be recorded as a "No Show". 11. No Show:  Defined as an unkept appointment where the patient failed to notify or declare to the practice their intention or inability to keep the appointment.  Corrective process for repeat offenders:  1. Tardiness: Three (3) episodes of rescheduling due to late arrivals will be recorded as one (1) "No Show". 2. Cancellation or reschedule: Three (3) cancellations or rescheduling will be recorded as one (1) "No Show". 3. "No Shows": Three (3) "No Shows" within a 12 month period will result in discharge from the  practice.  ____________________________________________________________________________________________   ____________________________________________________________________________________________  Pain Scale  Introduction: The pain score used by this practice is the Verbal Numerical Rating Scale (VNRS-11). This is an 11-point scale. It is for adults and children 10 years or older. There are significant differences in how the pain score is reported, used, and applied. Forget everything you learned in the past and learn this scoring system.  General Information: The scale should reflect your current level of pain. Unless you are specifically asked for the level of your worst pain, or your average pain. If you are asked for one of these two, then it should be understood that it is over the past 24 hours.  Basic Activities of Daily Living (ADL): Personal hygiene, dressing, eating, transferring, and using restroom.  Instructions: Most patients tend to report their level of pain as a combination of two factors, their physical pain and their psychosocial pain. This last one is also known as "suffering" and it is reflection of how physical pain affects you socially and psychologically. From now on, report them separately. From this point on, when asked to report your pain level, report only your physical pain. Use the following table for reference.  Pain Clinic Pain Levels (0-5/10)  Pain Level Score  Description  No Pain 0   Mild pain 1 Nagging, annoying, but does not interfere with basic activities of daily living (ADL). Patients are able to eat, bathe, get dressed, toileting (being able to get on and off the toilet and perform personal hygiene functions), transfer (move in and out of bed or a chair without assistance), and maintain continence (able to control bladder and bowel functions). Blood pressure and heart rate are unaffected. A normal heart rate for a healthy adult ranges from 60 to 100 bpm  (beats per minute).   Mild to moderate pain 2 Noticeable and distracting. Impossible to hide from other people. More frequent flare-ups. Still possible to adapt and function close to normal. It can be very annoying and may have occasional stronger flare-ups. With discipline, patients may get used to it and adapt.   Moderate pain 3 Interferes significantly with activities of daily living (ADL). It becomes difficult to feed, bathe, get dressed, get on and off the toilet or to perform personal hygiene functions. Difficult to get in and out of bed or a chair without assistance. Very distracting. With effort, it can be ignored when deeply involved in activities.   Moderately severe pain 4 Impossible to ignore for more than a few minutes. With effort, patients may still be able to manage work or participate in some social activities. Very difficult to concentrate. Signs of autonomic nervous system discharge are evident: dilated pupils (mydriasis);  mild sweating (diaphoresis); sleep interference. Heart rate becomes elevated (>115 bpm). Diastolic blood pressure (lower number) rises above 100 mmHg. Patients find relief in laying down and not moving.   Severe pain 5 Intense and extremely unpleasant. Associated with frowning face and frequent crying. Pain overwhelms the senses.  Ability to do any activity or maintain social relationships becomes significantly limited. Conversation becomes difficult. Pacing back and forth is common, as getting into a comfortable position is nearly impossible. Pain wakes you up from deep sleep. Physical signs will be obvious: pupillary dilation; increased sweating; goosebumps; brisk reflexes; cold, clammy hands and feet; nausea, vomiting or dry heaves; loss of appetite; significant sleep disturbance with inability to fall asleep or to remain asleep. When persistent, significant weight loss is observed due to the complete loss of appetite and sleep deprivation.  Blood pressure and heart  rate becomes significantly elevated. Caution: If elevated blood pressure triggers a pounding headache associated with blurred vision, then the patient should immediately seek attention at an urgent or emergency care unit, as these may be signs of an impending stroke.    Emergency Department Pain Levels (6-10/10)  Emergency Room Pain 6 Severely limiting. Requires emergency care and should not be seen or managed at an outpatient pain management facility. Communication becomes difficult and requires great effort. Assistance to reach the emergency department may be required. Facial flushing and profuse sweating along with potentially dangerous increases in heart rate and blood pressure will be evident.   Distressing pain 7 Self-care is very difficult. Assistance is required to transport, or use restroom. Assistance to reach the emergency department will be required. Tasks requiring coordination, such as bathing and getting dressed become very difficult.   Disabling pain 8 Self-care is no longer possible. At this level, pain is disabling. The individual is unable to do even the most "basic" activities such as walking, eating, bathing, dressing, transferring to a bed, or toileting. Fine motor skills are lost. It is difficult to think clearly.   Incapacitating pain 9 Pain becomes incapacitating. Thought processing is no longer possible. Difficult to remember your own name. Control of movement and coordination are lost.   The worst pain imaginable 10 At this level, most patients pass out from pain. When this level is reached, collapse of the autonomic nervous system occurs, leading to a sudden drop in blood pressure and heart rate. This in turn results in a temporary and dramatic drop in blood flow to the brain, leading to a loss of consciousness. Fainting is one of the body's self defense mechanisms. Passing out puts the brain in a calmed state and causes it to shut down for a while, in order to begin the  healing process.    Summary: 1. Refer to this scale when providing [place] with your pain level. 2. Be accurate and careful when reporting your pain level. This will help with your care. 3. Over-reporting your pain level will lead to loss of credibility. 4. Even a level of 1/10 means that there is pain and will be treated at our facility. 5. High, inaccurate reporting will be documented as "Symptom Exaggeration", leading to loss of credibility and suspicions of possible secondary gains such as obtaining more narcotics, or wanting to appear disabled, for fraudulent reasons. 6. Only pain levels of 5 or below will be seen at our facility. 7. Pain levels of 6 and above will be sent to the Emergency Department and the appointment cancelled. ____________________________________________________________________________________________

## 2018-02-07 NOTE — Patient Instructions (Addendum)

## 2018-02-07 NOTE — Progress Notes (Signed)
Safety precautions to be maintained throughout the outpatient stay will include: orient to surroundings, keep bed in low position, maintain call bell within reach at all times, provide assistance with transfer out of bed and ambulation.  

## 2018-02-08 ENCOUNTER — Other Ambulatory Visit: Payer: Self-pay | Admitting: Specialist

## 2018-02-08 ENCOUNTER — Other Ambulatory Visit: Payer: Self-pay | Admitting: Acute Care

## 2018-02-08 DIAGNOSIS — D869 Sarcoidosis, unspecified: Secondary | ICD-10-CM

## 2018-02-08 DIAGNOSIS — I729 Aneurysm of unspecified site: Secondary | ICD-10-CM

## 2018-02-11 ENCOUNTER — Ambulatory Visit
Admission: RE | Admit: 2018-02-11 | Discharge: 2018-02-11 | Disposition: A | Discharge status: Home / Self Care | Payer: Medicare Other | Source: Ambulatory Visit | Attending: Acute Care | Admitting: Acute Care

## 2018-02-11 DIAGNOSIS — I700 Atherosclerosis of aorta: Secondary | ICD-10-CM | POA: Insufficient documentation

## 2018-02-11 DIAGNOSIS — I729 Aneurysm of unspecified site: Secondary | ICD-10-CM

## 2018-02-11 DIAGNOSIS — D869 Sarcoidosis, unspecified: Secondary | ICD-10-CM | POA: Insufficient documentation

## 2018-02-11 DIAGNOSIS — I2510 Atherosclerotic heart disease of native coronary artery without angina pectoris: Secondary | ICD-10-CM | POA: Diagnosis not present

## 2018-02-11 DIAGNOSIS — I2721 Secondary pulmonary arterial hypertension: Secondary | ICD-10-CM | POA: Diagnosis not present

## 2018-02-11 HISTORY — DX: Unspecified asthma, uncomplicated: J45.909

## 2018-02-11 MED ORDER — IOPAMIDOL (ISOVUE-370) INJECTION 76%
75.0000 mL | Freq: Once | INTRAVENOUS | Status: AC | PRN
  Administered 2018-02-11: 75 mL via INTRAVENOUS

## 2018-02-11 NOTE — Progress Notes (Signed)
Results were reviewed and found to be: mildly abnormal  No acute injury or pathology identified  Review would suggest interventional pain management techniques may be of benefit 

## 2018-02-11 NOTE — Progress Notes (Signed)
Results were reviewed and found to be: abnormal, but not significant  No acute injury or pathology identified  Review would suggest interventional pain management techniques may be of benefit

## 2018-02-13 LAB — 25-HYDROXYVITAMIN D LCMS D2+D3: 25-HYDROXY, VITAMIN D: 14 ng/mL — AB

## 2018-02-13 LAB — 25-HYDROXY VITAMIN D LCMS D2+D3: 25-Hydroxy, Vitamin D-3: 14 ng/mL

## 2018-02-13 LAB — COMPLIANCE DRUG ANALYSIS, UR

## 2018-02-14 ENCOUNTER — Encounter: Payer: Self-pay | Admitting: Emergency Medicine

## 2018-02-14 ENCOUNTER — Encounter: Payer: Self-pay | Admitting: Nurse Practitioner

## 2018-02-14 ENCOUNTER — Emergency Department
Admission: EM | Admit: 2018-02-14 | Discharge: 2018-02-14 | Disposition: A | Discharge status: Home / Self Care | Payer: Medicare Other | Attending: Emergency Medicine | Admitting: Emergency Medicine

## 2018-02-14 ENCOUNTER — Other Ambulatory Visit: Payer: Self-pay

## 2018-02-14 DIAGNOSIS — M25552 Pain in left hip: Secondary | ICD-10-CM | POA: Insufficient documentation

## 2018-02-14 DIAGNOSIS — M5440 Lumbago with sciatica, unspecified side: Secondary | ICD-10-CM | POA: Diagnosis not present

## 2018-02-14 DIAGNOSIS — M549 Dorsalgia, unspecified: Secondary | ICD-10-CM | POA: Diagnosis present

## 2018-02-14 DIAGNOSIS — M25561 Pain in right knee: Secondary | ICD-10-CM | POA: Diagnosis not present

## 2018-02-14 DIAGNOSIS — Z79891 Long term (current) use of opiate analgesic: Secondary | ICD-10-CM | POA: Diagnosis not present

## 2018-02-14 DIAGNOSIS — R262 Difficulty in walking, not elsewhere classified: Secondary | ICD-10-CM | POA: Insufficient documentation

## 2018-02-14 DIAGNOSIS — M25562 Pain in left knee: Secondary | ICD-10-CM | POA: Insufficient documentation

## 2018-02-14 DIAGNOSIS — I10 Essential (primary) hypertension: Secondary | ICD-10-CM | POA: Diagnosis not present

## 2018-02-14 DIAGNOSIS — M25551 Pain in right hip: Secondary | ICD-10-CM | POA: Diagnosis not present

## 2018-02-14 DIAGNOSIS — E559 Vitamin D deficiency, unspecified: Secondary | ICD-10-CM | POA: Insufficient documentation

## 2018-02-14 DIAGNOSIS — F410 Panic disorder [episodic paroxysmal anxiety] without agoraphobia: Secondary | ICD-10-CM | POA: Diagnosis not present

## 2018-02-14 DIAGNOSIS — G8929 Other chronic pain: Secondary | ICD-10-CM

## 2018-02-14 DIAGNOSIS — J449 Chronic obstructive pulmonary disease, unspecified: Secondary | ICD-10-CM | POA: Insufficient documentation

## 2018-02-14 MED ORDER — HYDROMORPHONE HCL 1 MG/ML IJ SOLN
1.0000 mg | Freq: Once | INTRAMUSCULAR | Status: AC
  Administered 2018-02-14: 1 mg via INTRAMUSCULAR

## 2018-02-14 MED ORDER — LORAZEPAM 0.5 MG PO TABS
0.5000 mg | ORAL_TABLET | Freq: Once | ORAL | Status: AC
  Administered 2018-02-14: 0.5 mg via ORAL

## 2018-02-14 MED ORDER — LORAZEPAM 0.5 MG PO TABS
ORAL_TABLET | ORAL | Status: AC
  Administered 2018-02-14: 0.5 mg via ORAL
  Filled 2018-02-14: qty 1

## 2018-02-14 MED ORDER — HYDROMORPHONE HCL 1 MG/ML IJ SOLN
INTRAMUSCULAR | Status: AC
  Administered 2018-02-14: 1 mg via INTRAMUSCULAR
  Filled 2018-02-14: qty 1

## 2018-02-14 MED ORDER — HYDROCODONE-ACETAMINOPHEN 10-325 MG PO TABS: 1.0000 | ORAL_TABLET | Freq: Three times a day (TID) | ORAL | 0 refills | Status: DC | PRN

## 2018-02-14 NOTE — ED Notes (Signed)
Pt verbalizes d.c understanding and follow up. Pt in NAD at time of deaprture, PT in [place] in lobby waiting on daughter for ride

## 2018-02-14 NOTE — ED Notes (Addendum)
Pt ambulated safely with cane with this RN assist stand by.

## 2018-02-14 NOTE — ED Triage Notes (Signed)
Pt to ED via POV with c/o multiple mechanical falls at home, pt wears bilat leg braces. PT c/o bilat hip pain with pain radiating down legs. Pt hx of COPD, wears 2L [place] chronically. Pt here for pain relief for hips and legs.

## 2018-02-14 NOTE — ED Notes (Signed)
PT unable to sign due to topaz malfnx

## 2018-02-14 NOTE — ED Provider Notes (Signed)
[facility] Emergency Department Provider Note  ____________________________________________  Time seen: Approximately 3:25 PM  I have reviewed the triage vital signs and the nursing notes.   HISTORY  Chief Complaint Fall and Hip Pain    HPI Jean Gomez is a 62 y.o. 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  The patient reports that she recently moved here from [place] in November.  She has reestablished care with most of her specialist but has not yet been seen in the pain clinic.  In [place], her pain was well controlled with 10 mg Norco.  Here, her pulmonologist has given her 7.5 mg hydrocodone, but this has not helped her pain.  She has also tried a topical "pain patch" that is over-the-counter which has not helped.  Over the last 2 or 3 days, she feels that her chronic pain has worsened but is not new in character.  She describes low back and bilateral hip pain that radiates into the legs and is so severe that occasionally her legs will buckle and she has been falling at home.  She uses a cane and a walker at home for ambulation.  She has not had any injury where she has hit her head, or has lost consciousness.  She also states that she has chronic daily headaches, and today has a headache which is unchanged compared to prior.  She has no associated visual changes, speech changes, numbness tingling or weakness.  She is severely anxious and tearful on my examination but denies feeling anxious and states "the reason I am here is because of my pain."  Past Medical History:  Diagnosis Date  . Asthma   . Cerebral aneurysm   . Chronic migraine   . Chronic respiratory failure (HCC)   . COPD (chronic obstructive pulmonary disease) (HCC)   . Hypertension   . Osteoarthritis   . Sarcoid   .  Seizures [facility])     Patient Active Problem List   Diagnosis Date Noted  . Vitamin D deficiency 02/14/2018  . Chronic pain syndrome 02/07/2018  . Pharmacologic therapy 02/07/2018  . Disorder of skeletal system 02/07/2018  . Problems influencing health status 02/07/2018  . Long term current use of opiate analgesic 02/07/2018  . Chronic bilateral low back pain with right-sided sciatica (Secondary Area of Pain) 02/07/2018  . Chronic pain of both lower extremities  (R>L) 02/07/2018  . Chronic pain of both knees [facility] Area of Pain) (R>L) 02/07/2018  . Chronic pain of both shoulders(Fourth Area of Pain) (R>L) 02/07/2018  . Occipital neuralgia of right side (Primary Area of Pain) 02/07/2018  . Chronic neck pain (Bilateral) (R>L) 02/07/2018  . Chronic right sacroiliac joint pain 02/07/2018  . H/O aneurysm 01/30/2018  . History of depression 01/30/2018  . History of seizure disorder 01/30/2018  . Chronic migraine without aura 01/17/2018  . Chronic respiratory failure with hypoxia (HCC) 01/17/2018  . Constipation 01/17/2018  . HTN, goal below 140/90 01/17/2018  . Hypercholesterolemia 01/17/2018  . Major depression in remission (HCC) 01/17/2018  . COPD (chronic obstructive pulmonary disease) (HCC) 01/17/2018  . Sarcoid 01/17/2018  . Obstructive sleep apnea on CPAP 09/11/2017  . Generalized anxiety disorder 01/26/2017  . Irritable bowel syndrome 01/26/2017  . Chest pain 11/01/2016  . GERD (gastroesophageal reflux disease) 08/02/2016  . Essential tremor 07/19/2016  . Muscle spasm 10/05/2015  .  Spondylosis of lumbar region without myelopathy or radiculopathy 10/05/2015  . Rotator cuff injury 09/17/2015  . Epilepsy (HCC) 07/14/2015  . Insomnia 07/14/2015  . Primary osteoarthritis of both knees 06/24/2015  . Osteoarthritis 05/27/2015    Past Surgical History:  Procedure Laterality Date  . ABDOMINAL HYSTERECTOMY    . CHOLECYSTECTOMY    . SPINAL FUSION      Current Outpatient Rx   . Order #: [id number] Class: Historical Med  . Order #: [id number] Class: Historical Med  . Order #: [id number] Class: Historical Med  . Order #: [id number] Class: Historical Med  . Order #: [id number] Class: Historical Med  . Order #: [id number] Class: Historical Med  . Order #: [id number] Class: Historical Med  . Order #: [id number] Class: Historical Med  . Order #: [id number] Class: Historical Med  . Order #: [id number] Class: Historical Med  . Order #: [id number] Class: Historical Med  . Order #: [id number] Class: Historical Med  . Order #: [id number] Class: Historical Med  . Order #: [id number] Class: Print  . Order #: [id number] Class: Historical Med  . Order #: [id number] Class: Historical Med  . Order #: [id number] Class: Historical Med  . Order #: [id number] Class: Historical Med  . Order #: [id number] Class: Historical Med  . Order #: [id number] Class: Historical Med  . Order #: [id number] Class: Historical Med  . Order #: [id number] Class: Historical Med  . Order #: [id number] Class: Historical Med  . Order #: [id number] Class: Historical Med  . Order #: [id number] Class: Historical Med  . Order #: [id number] Class: Historical Med  . Order #: [id number] Class: Historical Med  . Order #: [id number] Class: Historical Med  . Order #: [id number] Class: Historical Med    Allergies Gabapentin; Labetalol; Sulfabenzamide; Fentanyl; and Penicillins  No family history on file.  Social History Social History   Tobacco Use  . Smoking status: Never Smoker  . Smokeless tobacco: Never Used  Substance Use Topics  . Alcohol use: Yes    Comment: occasional  . Drug use: No    Review of Systems Constitutional: No fever/chills.  Positive recurrent falls without loss of consciousness or injury.  Uses a cane and walker to ambulate. Eyes: No visual changes.  No blurred or double vision. ENT: No sore throat. No congestion or rhinorrhea. Cardiovascular: Denies chest pain. Denies palpitations. Respiratory: Denies shortness of breath.   No cough. Gastrointestinal: No abdominal pain.  No nausea, no vomiting.  No diarrhea.  No constipation. Genitourinary: Negative for dysuria. Musculoskeletal: Positive for diffuse chronic pain.  She has pain in the lower back, bilateral hips, bilateral knees.  Her knees were last injected with corticosteroids in January by a local orthopedist. Skin: Negative for rash. Neurological: Positive for chronic unchanged headaches. No focal numbness, tingling or weakness.  Positive difficulty walking due to pain but not imbalance or numbness. Psychiatric:Positive anxiety.  ____________________________________________   PHYSICAL EXAM:  VITAL SIGNS: ED Triage Vitals  Enc Vitals Group     BP 02/14/18 1104 (!) 184/85     Pulse Rate 02/14/18 1104 (!) 106     Resp 02/14/18 1104 16     Temp 02/14/18 1104 99.3 F (37.4 C)     Temp Source 02/14/18 1104 Oral     SpO2 02/14/18 1104 100 %     Weight --      Height --      Head Circumference --      Peak Flow --      Pain Score 02/14/18 1105 10     Pain Loc --      Pain Edu? --  Excl. in GC? --     Constitutional: The patient is alert, oriented and answering questions appropriately.  She is chronically ill-appearing but nontoxic.  She is tearful, tremulous, and has some mild pressured speech.  She is very anxious. Eyes: Conjunctivae are normal.  EOMI. No scleral icterus. Head: Atraumatic. Nose: No congestion/rhinnorhea. Mouth/Throat: Mucous membranes are moist.  Neck: No stridor.  Supple.  No midline C-spine tenderness to palpation, step-offs or deformities.  Full range of motion without pain. Cardiovascular: Normal rate, regular rhythm. No murmurs, rubs or gallops.  Respiratory: Normal respiratory effort.  No accessory muscle use or retractions. Lungs CTAB.  No wheezes, rales or ronchi. Gastrointestinal: Soft, nontender and nondistended.  No guarding or rebound.  No peritoneal signs. Musculoskeletal: No thoracic tenderness to palpation,  step-offs or deformities.  Diffuse lower back pain both in the midline and laterally without any focality, no palpable step-offs or deformities.  The patient has full range of motion of the bilateral hips, knees and ankles, and states that this is painful for her.  5 out of 5 hip flexion, quadricep and hamstring strength, dorsiflexion and plantar flexion.  Normal sensation to light touch in the bilateral lower extremities.  Normal DP and PT pulses bilaterally. Neurologic:  A&Ox3.  Speech is clear.  Face and smile are symmetric.  EOMI.  Moves all extremities well. Skin:  Skin is warm, dry.  Small skin tear over the left hip that is 1 x 0.5 cm and superficial that the patient reports is from the pain patch; no evidence of surrounding erythema, swelling or purulent discharge. Psychiatric: Severe anxiety with some pressured speech, tremulousness. ____________________________________________   LABS (all labs ordered are listed, but only abnormal results are displayed)  Labs Reviewed - No data to display ____________________________________________  EKG  Not indicated ____________________________________________  RADIOLOGY  No results found.  ____________________________________________   PROCEDURES  Procedure(s) performed: None  Procedures  Critical Care performed: No ____________________________________________   INITIAL IMPRESSION / ASSESSMENT AND PLAN / ED COURSE  Pertinent labs & imaging results that were available during my care of the patient were reviewed by me and considered in my medical decision making (see chart for details).  62 y.o. female with a history of sarcoidosis, COPD on 2 L nasal cannula, chronic pain syndrome, who was recently moved from [place] and has not been on her regular pain regimen which has previously well controlled her pain in the past.  Overall, the patient is significantly anxious and has vital signs of hypertension and tachycardia which are  consistent with her anxiety and panic.  I do not think she is having any ACS or MI or other acute cause for these symptoms.  Her exam is reassuring for nontraumatic pain; x-ray imaging is not indicated today.  The patient has no evidence of septic arthritis, or acute epidural abscess.  There is no history or physical exam findings that would be suggestive of spine compression, cauda equina syndrome.  At this time, we will give the patient and intramuscular shot of Dilaudid; she has deferred any oral medication and states "I have taken too many pills, I need a shot."  I will also give her oral Ativan for her anxiety.  I have talked to the patient about follow-up with Dr. Dareen Piano, her PCP, and the pain clinic for long-term pain management plan.  I will plan to make sure the patient can ambulate safely with a walker or cane, and recommend that she get assistance from her daughter with whom  she lives.  The patient will go home with 20 tablets of Norco for her pain, and follow-up closely with her PMD.  The patient does not have any evidence of an acute emergency condition today, and will be discharged safely.  Return precautions as well as follow-up instructions were discussed.  ____________________________________________  FINAL CLINICAL IMPRESSION(S) / ED DIAGNOSES  Final diagnoses:  Chronic midline low back pain with sciatica, sciatica laterality unspecified  Chronic pain of both hips  Chronic pain of both knees  Difficulty walking  Panic attack         NEW MEDICATIONS STARTED DURING THIS VISIT:  Current Discharge Medication List        Rockne MenghiniNorman, Anne-Caroline, MD 02/14/18 1537

## 2018-02-14 NOTE — Discharge Instructions (Signed)
Please make an appoint with your primary care physician for long-term pain management plan.  Please see if you are able to get into the pain clinic sooner.  Return to the emergency department if you develop severe pain, lightheadedness or fainting, inability to walk, new numbness tingling or weakness, fever, or any other symptoms concerning to you.

## 2018-02-14 NOTE — ED Notes (Signed)
Pt states she has issues with both her knees and wears a splint on both and has a lot of falls, states in the past 24hrs she has fallen about 7-8 times and is having a lot of lower back pain that radiates into both legs intermittently. Denies other injuries. Pt uses a cane at home to ambulate..Marland Kitchen

## 2018-02-18 ENCOUNTER — Ambulatory Visit
Admission: RE | Admit: 2018-02-18 | Discharge: 2018-02-18 | Disposition: A | Discharge status: Home / Self Care | Payer: Medicare Other | Source: Ambulatory Visit | Attending: Acute Care | Admitting: Acute Care

## 2018-02-18 DIAGNOSIS — G379 Demyelinating disease of central nervous system, unspecified: Secondary | ICD-10-CM | POA: Insufficient documentation

## 2018-02-18 LAB — CSF CELL COUNT WITH DIFFERENTIAL
EOS CSF: 0 %
Eosinophils, CSF: 0 %
LYMPHS CSF: 70 %
Lymphs, CSF: 83 %
MONOCYTE-MACROPHAGE-SPINAL FLUID: 30 %
MONOCYTE-MACROPHAGE-SPINAL FLUID: 17 %
OTHER CELLS CSF: 0
Other Cells, CSF: 0
RBC COUNT CSF: 0 /mm3 (ref 0–3)
RBC Count, CSF: 2 /mm3 (ref 0–3)
SEGMENTED NEUTROPHILS-CSF: 0 %
Segmented Neutrophils-CSF: 0 %
Tube #: 1
Tube #: 4
WBC CSF: 5 /mm3 (ref 0–5)
WBC, CSF: 5 /mm3 (ref 0–5)

## 2018-02-18 LAB — ALBUMIN: Albumin: 4.2 g/dL (ref 3.5–5.0)

## 2018-02-18 LAB — GLUCOSE, CSF: GLUCOSE CSF: 67 mg/dL (ref 40–70)

## 2018-02-18 LAB — PROTEIN, CSF: Total  Protein, CSF: 40 mg/dL (ref 15–45)

## 2018-02-18 MED ORDER — LIDOCAINE HCL (PF) 1 % IJ SOLN
5.0000 mL | Freq: Once | INTRAMUSCULAR | Status: AC
  Administered 2018-02-18: 5 mL
  Filled 2018-02-18: qty 5

## 2018-02-18 MED ORDER — HYDROCODONE-ACETAMINOPHEN 10-325 MG PO TABS
1.0000 | ORAL_TABLET | Freq: Once | ORAL | Status: AC
  Administered 2018-02-18: 1 via ORAL
  Filled 2018-02-18: qty 1

## 2018-02-18 NOTE — Progress Notes (Addendum)
Dr. Allena KatzPatel at bedside, [facility]. Discharge instructions reviewed with Pt and Dtr via phone.

## 2018-02-20 LAB — IGG CSF INDEX
Albumin CSF-mCnc: 27 mg/dL (ref 11–48)
Albumin: 4.5 g/dL (ref 3.6–4.8)
CSF IgG Index: 0.4 (ref 0.0–0.7)
IgG (Immunoglobin G), Serum: 983 mg/dL (ref 700–1600)
IgG, CSF: 2.6 mg/dL (ref 0.0–8.6)
IgG/Alb Ratio, CSF: 0.1 (ref 0.00–0.25)

## 2018-02-21 LAB — CSF CULTURE W GRAM STAIN: Culture: NO GROWTH

## 2018-02-21 LAB — CSF CULTURE

## 2018-02-25 ENCOUNTER — Other Ambulatory Visit: Payer: Self-pay | Admitting: Pain Medicine

## 2018-02-25 ENCOUNTER — Other Ambulatory Visit: Payer: Self-pay

## 2018-02-25 ENCOUNTER — Encounter: Payer: Self-pay | Admitting: Pain Medicine

## 2018-02-25 ENCOUNTER — Ambulatory Visit
Admission: RE | Admit: 2018-02-25 | Discharge: 2018-02-25 | Disposition: A | Discharge status: Home / Self Care | Payer: Medicare Other | Source: Ambulatory Visit | Attending: Pain Medicine | Admitting: Pain Medicine

## 2018-02-25 ENCOUNTER — Ambulatory Visit: Payer: Medicare Other | Attending: Pain Medicine | Admitting: Pain Medicine

## 2018-02-25 VITALS — BP 140/85 | HR 105 | Temp 98.6°F | Resp 18 | Ht 64.0 in | Wt 170.0 lb

## 2018-02-25 DIAGNOSIS — Z888 Allergy status to other drugs, medicaments and biological substances status: Secondary | ICD-10-CM | POA: Diagnosis not present

## 2018-02-25 DIAGNOSIS — M4802 Spinal stenosis, cervical region: Secondary | ICD-10-CM | POA: Diagnosis not present

## 2018-02-25 DIAGNOSIS — M47816 Spondylosis without myelopathy or radiculopathy, lumbar region: Secondary | ICD-10-CM | POA: Diagnosis not present

## 2018-02-25 DIAGNOSIS — E559 Vitamin D deficiency, unspecified: Secondary | ICD-10-CM

## 2018-02-25 DIAGNOSIS — M5030 Other cervical disc degeneration, unspecified cervical region: Secondary | ICD-10-CM

## 2018-02-25 DIAGNOSIS — J9611 Chronic respiratory failure with hypoxia: Secondary | ICD-10-CM | POA: Insufficient documentation

## 2018-02-25 DIAGNOSIS — G894 Chronic pain syndrome: Secondary | ICD-10-CM | POA: Diagnosis not present

## 2018-02-25 DIAGNOSIS — Z7982 Long term (current) use of aspirin: Secondary | ICD-10-CM | POA: Diagnosis not present

## 2018-02-25 DIAGNOSIS — M5441 Lumbago with sciatica, right side: Secondary | ICD-10-CM

## 2018-02-25 DIAGNOSIS — Z9889 Other specified postprocedural states: Secondary | ICD-10-CM | POA: Insufficient documentation

## 2018-02-25 DIAGNOSIS — Z880 Allergy status to penicillin: Secondary | ICD-10-CM | POA: Diagnosis not present

## 2018-02-25 DIAGNOSIS — M25511 Pain in right shoulder: Secondary | ICD-10-CM | POA: Diagnosis present

## 2018-02-25 DIAGNOSIS — R519 Headache, unspecified: Secondary | ICD-10-CM

## 2018-02-25 DIAGNOSIS — M4310 Spondylolisthesis, site unspecified: Secondary | ICD-10-CM

## 2018-02-25 DIAGNOSIS — Z79891 Long term (current) use of opiate analgesic: Secondary | ICD-10-CM

## 2018-02-25 DIAGNOSIS — M25552 Pain in left hip: Secondary | ICD-10-CM

## 2018-02-25 DIAGNOSIS — Z789 Other specified health status: Secondary | ICD-10-CM

## 2018-02-25 DIAGNOSIS — R51 Headache: Secondary | ICD-10-CM | POA: Diagnosis not present

## 2018-02-25 DIAGNOSIS — M47812 Spondylosis without myelopathy or radiculopathy, cervical region: Secondary | ICD-10-CM | POA: Diagnosis not present

## 2018-02-25 DIAGNOSIS — I671 Cerebral aneurysm, nonruptured: Secondary | ICD-10-CM

## 2018-02-25 DIAGNOSIS — M542 Cervicalgia: Secondary | ICD-10-CM | POA: Diagnosis present

## 2018-02-25 DIAGNOSIS — M9981 Other biomechanical lesions of cervical region: Secondary | ICD-10-CM | POA: Diagnosis not present

## 2018-02-25 DIAGNOSIS — G8929 Other chronic pain: Secondary | ICD-10-CM

## 2018-02-25 DIAGNOSIS — I10 Essential (primary) hypertension: Secondary | ICD-10-CM | POA: Insufficient documentation

## 2018-02-25 DIAGNOSIS — M5136 Other intervertebral disc degeneration, lumbar region: Secondary | ICD-10-CM | POA: Diagnosis not present

## 2018-02-25 DIAGNOSIS — J449 Chronic obstructive pulmonary disease, unspecified: Secondary | ICD-10-CM | POA: Insufficient documentation

## 2018-02-25 DIAGNOSIS — M25512 Pain in left shoulder: Secondary | ICD-10-CM

## 2018-02-25 DIAGNOSIS — M25562 Pain in left knee: Secondary | ICD-10-CM

## 2018-02-25 DIAGNOSIS — M5031 Other cervical disc degeneration,  high cervical region: Secondary | ICD-10-CM | POA: Diagnosis not present

## 2018-02-25 DIAGNOSIS — M7918 Myalgia, other site: Secondary | ICD-10-CM

## 2018-02-25 DIAGNOSIS — K219 Gastro-esophageal reflux disease without esophagitis: Secondary | ICD-10-CM | POA: Insufficient documentation

## 2018-02-25 DIAGNOSIS — M5116 Intervertebral disc disorders with radiculopathy, lumbar region: Secondary | ICD-10-CM | POA: Insufficient documentation

## 2018-02-25 DIAGNOSIS — M47813 Spondylosis without myelopathy or radiculopathy, cervicothoracic region: Secondary | ICD-10-CM

## 2018-02-25 DIAGNOSIS — Z79899 Other long term (current) drug therapy: Secondary | ICD-10-CM

## 2018-02-25 DIAGNOSIS — M5481 Occipital neuralgia: Secondary | ICD-10-CM

## 2018-02-25 DIAGNOSIS — M5416 Radiculopathy, lumbar region: Secondary | ICD-10-CM

## 2018-02-25 DIAGNOSIS — M170 Bilateral primary osteoarthritis of knee: Secondary | ICD-10-CM

## 2018-02-25 DIAGNOSIS — M79605 Pain in left leg: Secondary | ICD-10-CM

## 2018-02-25 DIAGNOSIS — M4726 Other spondylosis with radiculopathy, lumbar region: Secondary | ICD-10-CM | POA: Diagnosis not present

## 2018-02-25 DIAGNOSIS — M19012 Primary osteoarthritis, left shoulder: Secondary | ICD-10-CM | POA: Diagnosis not present

## 2018-02-25 DIAGNOSIS — Z90710 Acquired absence of both cervix and uterus: Secondary | ICD-10-CM | POA: Insufficient documentation

## 2018-02-25 DIAGNOSIS — M545 Low back pain: Secondary | ICD-10-CM | POA: Diagnosis present

## 2018-02-25 DIAGNOSIS — M19011 Primary osteoarthritis, right shoulder: Secondary | ICD-10-CM | POA: Diagnosis not present

## 2018-02-25 DIAGNOSIS — Z9049 Acquired absence of other specified parts of digestive tract: Secondary | ICD-10-CM | POA: Insufficient documentation

## 2018-02-25 DIAGNOSIS — M899 Disorder of bone, unspecified: Secondary | ICD-10-CM

## 2018-02-25 DIAGNOSIS — M150 Primary generalized (osteo)arthritis: Secondary | ICD-10-CM

## 2018-02-25 DIAGNOSIS — M79604 Pain in right leg: Secondary | ICD-10-CM

## 2018-02-25 DIAGNOSIS — M25561 Pain in right knee: Secondary | ICD-10-CM

## 2018-02-25 DIAGNOSIS — M25551 Pain in right hip: Secondary | ICD-10-CM

## 2018-02-25 DIAGNOSIS — M51369 Other intervertebral disc degeneration, lumbar region without mention of lumbar back pain or lower extremity pain: Secondary | ICD-10-CM

## 2018-02-25 DIAGNOSIS — M159 Polyosteoarthritis, unspecified: Secondary | ICD-10-CM | POA: Insufficient documentation

## 2018-02-25 DIAGNOSIS — Z885 Allergy status to narcotic agent status: Secondary | ICD-10-CM | POA: Insufficient documentation

## 2018-02-25 DIAGNOSIS — M533 Sacrococcygeal disorders, not elsewhere classified: Secondary | ICD-10-CM

## 2018-02-25 DIAGNOSIS — D869 Sarcoidosis, unspecified: Secondary | ICD-10-CM

## 2018-02-25 LAB — JC VIRUS, PCR CSF: JC Virus PCR, CSF: NEGATIVE

## 2018-02-25 LAB — OLIGOCLONAL BANDS, CSF + SERM

## 2018-02-25 MED ORDER — TIZANIDINE HCL 4 MG PO TABS: 4.0000 mg | ORAL_TABLET | Freq: Three times a day (TID) | ORAL | 0 refills | Status: DC

## 2018-02-25 MED ORDER — HYDROCODONE-ACETAMINOPHEN 10-325 MG PO TABS: 1.0000 | ORAL_TABLET | Freq: Three times a day (TID) | ORAL | 0 refills | Status: DC | PRN

## 2018-02-25 MED ORDER — ERGOCALCIFEROL 1.25 MG (50000 UT) PO CAPS: 50000.0000 [IU] | ORAL_CAPSULE | ORAL | 0 refills | Status: DC

## 2018-02-25 NOTE — Patient Instructions (Addendum)
____________________________________________________________________________________________  Pain Scale  Introduction: The pain score used by this practice is the Verbal Numerical Rating Scale (VNRS-11). This is an 11-point scale. It is for adults and children 10 years or older. There are significant differences in how the pain score is reported, used, and applied. Forget everything you learned in the past and learn this scoring system.  General Information: The scale should reflect your current level of pain. Unless you are specifically asked for the level of your worst pain, or your average pain. If you are asked for one of these two, then it should be understood that it is over the past 24 hours.  Basic Activities of Daily Living (ADL): Personal hygiene, dressing, eating, transferring, and using restroom.  Instructions: Most patients tend to report their level of pain as a combination of two factors, their physical pain and their psychosocial pain. This last one is also known as "suffering" and it is reflection of how physical pain affects you socially and psychologically. From now on, report them separately. From this point on, when asked to report your pain level, report only your physical pain. Use the following table for reference.  Pain Clinic Pain Levels (0-5/10)  Pain Level Score  Description  No Pain 0   Mild pain 1 Nagging, annoying, but does not interfere with basic activities of daily living (ADL). Patients are able to eat, bathe, get dressed, toileting (being able to get on and off the toilet and perform personal hygiene functions), transfer (move in and out of bed or a chair without assistance), and maintain continence (able to control bladder and bowel functions). Blood pressure and heart rate are unaffected. A normal heart rate for a healthy adult ranges from 60 to 100 bpm (beats per minute).   Mild to moderate pain 2 Noticeable and distracting. Impossible to hide from other  people. More frequent flare-ups. Still possible to adapt and function close to normal. It can be very annoying and may have occasional stronger flare-ups. With discipline, patients may get used to it and adapt.   Moderate pain 3 Interferes significantly with activities of daily living (ADL). It becomes difficult to feed, bathe, get dressed, get on and off the toilet or to perform personal hygiene functions. Difficult to get in and out of bed or a chair without assistance. Very distracting. With effort, it can be ignored when deeply involved in activities.   Moderately severe pain 4 Impossible to ignore for more than a few minutes. With effort, patients may still be able to manage work or participate in some social activities. Very difficult to concentrate. Signs of autonomic nervous system discharge are evident: dilated pupils (mydriasis); mild sweating (diaphoresis); sleep interference. Heart rate becomes elevated (>115 bpm). Diastolic blood pressure (lower number) rises above 100 mmHg. Patients find relief in laying down and not moving.   Severe pain 5 Intense and extremely unpleasant. Associated with frowning face and frequent crying. Pain overwhelms the senses.  Ability to do any activity or maintain social relationships becomes significantly limited. Conversation becomes difficult. Pacing back and forth is common, as getting into a comfortable position is nearly impossible. Pain wakes you up from deep sleep. Physical signs will be obvious: pupillary dilation; increased sweating; goosebumps; brisk reflexes; cold, clammy hands and feet; nausea, vomiting or dry heaves; loss of appetite; significant sleep disturbance with inability to fall asleep or to remain asleep. When persistent, significant weight loss is observed due to the complete loss of appetite and sleep deprivation.  Blood   pressure and heart rate becomes significantly elevated. Caution: If elevated blood pressure triggers a pounding headache  associated with blurred vision, then the patient should immediately seek attention at an urgent or emergency care unit, as these may be signs of an impending stroke.    Emergency Department Pain Levels (6-10/10)  Emergency Room Pain 6 Severely limiting. Requires emergency care and should not be seen or managed at an outpatient pain management facility. Communication becomes difficult and requires great effort. Assistance to reach the emergency department may be required. Facial flushing and profuse sweating along with potentially dangerous increases in heart rate and blood pressure will be evident.   Distressing pain 7 Self-care is very difficult. Assistance is required to transport, or use restroom. Assistance to reach the emergency department will be required. Tasks requiring coordination, such as bathing and getting dressed become very difficult.   Disabling pain 8 Self-care is no longer possible. At this level, pain is disabling. The individual is unable to do even the most "basic" activities such as walking, eating, bathing, dressing, transferring to a bed, or toileting. Fine motor skills are lost. It is difficult to think clearly.   Incapacitating pain 9 Pain becomes incapacitating. Thought processing is no longer possible. Difficult to remember your own name. Control of movement and coordination are lost.   The worst pain imaginable 10 At this level, most patients pass out from pain. When this level is reached, collapse of the autonomic nervous system occurs, leading to a sudden drop in blood pressure and heart rate. This in turn results in a temporary and dramatic drop in blood flow to the brain, leading to a loss of consciousness. Fainting is one of the body's self defense mechanisms. Passing out puts the brain in a calmed state and causes it to shut down for a while, in order to begin the healing process.    Summary: 1. Refer to this scale when providing us with your pain level. 2. Be  accurate and careful when reporting your pain level. This will help with your care. 3. Over-reporting your pain level will lead to loss of credibility. 4. Even a level of 1/10 means that there is pain and will be treated at our facility. 5. High, inaccurate reporting will be documented as "Symptom Exaggeration", leading to loss of credibility and suspicions of possible secondary gains such as obtaining more narcotics, or wanting to appear disabled, for fraudulent reasons. 6. Only pain levels of 5 or below will be seen at our facility. 7. Pain levels of 6 and above will be sent to the Emergency Department and the appointment cancelled. ____________________________________________________________________________________________   ____________________________________________________________________________________________  Preparing for Procedure with Sedation  Instructions: . Oral Intake: Do not eat or drink anything for at least 8 hours prior to your procedure. . Transportation: Public transportation is not allowed. Bring an adult driver. The driver must be physically present in our waiting room before any procedure can be started. . Physical Assistance: Bring an adult physically capable of assisting you, in the event you need help. This adult should keep you company at home for at least 6 hours after the procedure. . Blood Pressure Medicine: Take your blood pressure medicine with a sip of water the morning of the procedure. . Blood thinners:  . Diabetics on insulin: Notify the staff so that you can be scheduled 1st case in the morning. If your diabetes requires high dose insulin, take only  of your normal insulin dose the morning of the procedure and   notify the staff that you have done so. . Preventing infections: Shower with an antibacterial soap the morning of your procedure. . Build-up your immune system: Take 1000 mg of Vitamin C with every meal (3 times a day) the day prior to your  procedure. Marland Kitchen Antibiotics: Inform the staff if you have a condition or reason that requires you to take antibiotics before dental procedures. . Pregnancy: If you are pregnant, call and cancel the procedure. . Sickness: If you have a cold, fever, or any active infections, call and cancel the procedure. . Arrival: You must be in the facility at least 30 minutes prior to your scheduled procedure. . Children: Do not bring children with you. . Dress appropriately: Bring dark clothing that you would not mind if they get stained. . Valuables: Do not bring any jewelry or valuables.  Procedure appointments are reserved for interventional treatments only. Marland Kitchen No Prescription Refills. . No medication changes will be discussed during procedure appointments. . No disability issues will be discussed.  Remember:  Regular Business hours are:  Monday to Thursday 8:00 AM to 4:00 PM  Provider's Schedule: Milinda Pointer, MD:  Procedure days: Tuesday and Thursday 7:30 AM to 4:00 PM  Gillis Santa, MD:  Procedure days: Monday and Wednesday 7:30 AM to 4:00 PM ____________________________________________________________________________________________   ____________________________________________________________________________________________  Medication Rules  Applies to: All patients receiving prescriptions (written or electronic).  Pharmacy of record: Pharmacy where electronic prescriptions will be sent. If written prescriptions are taken to a different pharmacy, please inform the nursing staff. The pharmacy listed in the electronic medical record should be the one where you would like electronic prescriptions to be sent.  Prescription refills: Only during scheduled appointments. Applies to both, written and electronic prescriptions.  NOTE: The following applies primarily to controlled substances (Opioid* Pain Medications).   Patient's responsibilities: 1. Pain Pills: Bring all pain pills to  every appointment (except for procedure appointments). 2. Pill Bottles: Bring pills in original pharmacy bottle. Always bring newest bottle. Bring bottle, even if empty. 3. Medication refills: You are responsible for knowing and keeping track of what medications you need refilled. The day before your appointment, write a list of all prescriptions that need to be refilled. Bring that list to your appointment and give it to the admitting nurse. Prescriptions will be written only during appointments. If you forget a medication, it will not be "Called in", "Faxed", or "electronically sent". You will need to get another appointment to get these prescribed. 4. Prescription Accuracy: You are responsible for carefully inspecting your prescriptions before leaving our office. Have the discharge nurse carefully go over each prescription with you, before taking them home. Make sure that your name is accurately spelled, that your address is correct. Check the name and dose of your medication to make sure it is accurate. Check the number of pills, and the written instructions to make sure they are clear and accurate. Make sure that you are given enough medication to last until your next medication refill appointment. 5. Taking Medication: Take medication as prescribed. Never take more pills than instructed. Never take medication more frequently than prescribed. Taking less pills or less frequently is permitted and encouraged, when it comes to controlled substances (written prescriptions).  6. Inform other Doctors: Always inform, all of your healthcare providers, of all the medications you take. 7. Pain Medication from other Providers: You are not allowed to accept any additional pain medication from any other Doctor or Healthcare provider. There are two exceptions  to this rule. (see below) In the event that you require additional pain medication, you are responsible for notifying us, as stated below. 8. Medication  Agreement: You are responsible for carefully reading and following our Medication Agreement. This must be signed before receiving any prescriptions from our practice. Safely store a copy of your signed Agreement. Violations to the Agreement will result in no further prescriptions. (Additional copies of our Medication Agreement are available upon request.) 9. Laws, Rules, & Regulations: All patients are expected to follow all [address] and [employer], Rules, [place] [employer]. Ignorance of the Laws does not constitute a valid excuse. The use of any illegal substances is prohibited. 10. Adopted CDC guidelines & recommendations: Target dosing levels will be at or below 60 MME/day. Use of benzodiazepines** is not recommended.  Exceptions: There are only two exceptions to the rule of not receiving pain medications from other Healthcare Providers. 1. Exception #1 (Emergencies): In the event of an emergency (i.e.: accident requiring emergency care), you are allowed to receive additional pain medication. However, you are responsible for: As soon as you are able, call our office [phone], at any time of the day or night, and leave a message stating your name, the date and nature of the emergency, and the name and dose of the medication prescribed. In the event that your call is answered by a member of our staff, make sure to document and save the date, time, and the name of the person that took your information.  2. Exception #2 (Planned Surgery): In the event that you are scheduled by another doctor or dentist to have any type of surgery or procedure, you are allowed (for a period no longer than 30 days), to receive additional pain medication, for the acute post-op pain. However, in this case, you are responsible for picking up a copy of our "Post-op Pain Management for Surgeons" handout, and giving it to your surgeon or dentist. This document is available at our office, and does not require an appointment  to obtain it. Simply go to our office during business hours (Monday-Thursday from 8:00 AM to 4:00 PM) (Friday 8:00 AM to 12:00 Noon) or if you have a scheduled appointment with [place], prior to your surgery, and ask for it by name. In addition, you will need to provide [place] with your name, name of your surgeon, type of surgery, and date of procedure or surgery.  *Opioid medications include: morphine, codeine, oxycodone, oxymorphone, hydrocodone, hydromorphone, meperidine, tramadol, tapentadol, buprenorphine, fentanyl, methadone. **Benzodiazepine medications include: diazepam (Valium), alprazolam (Xanax), clonazepam (Klonopine), lorazepam (Ativan), clorazepate (Tranxene), chlordiazepoxide (Librium), estazolam (Prosom), oxazepam (Serax), temazepam (Restoril), triazolam (Halcion) (Last updated: 02/14/2018) ____________________________________________________________________________________________  ____________________________________________________________________________________________  Medication Recommendations and Reminders  Applies to: All patients receiving prescriptions (written and/or electronic).  Medication Rules & Regulations: These rules and regulations exist for your safety and that of others. They are not flexible and neither are we. Dismissing or ignoring them will be considered "non-compliance" with medication therapy, resulting in complete and irreversible termination of such therapy. (See document titled "Medication Rules" for more details.) In all conscience, because of safety reasons, we cannot continue providing a therapy where the patient does not follow instructions.  Pharmacy of record:   Definition: This is the pharmacy where your electronic prescriptions will be sent.   We do not endorse any particular pharmacy.  You are not restricted in your choice of pharmacy.  The pharmacy listed in the electronic medical record should be the one where you  want electronic prescriptions  to be sent.  If you choose to change pharmacy, simply notify our nursing staff of your choice of new pharmacy.  Recommendations:  Keep all of your pain medications in a safe place, under lock and key, even if you live alone.   After you fill your prescription, take 1 week's worth of pills and put them away in a safe place. You should keep a separate, properly labeled bottle for this purpose. The remainder should be kept in the original bottle. Use this as your primary supply, until it runs out. Once it's gone, then you know that you have 1 week's worth of medicine, and it is time to come in for a prescription refill. If you do this correctly, it is unlikely that you will ever run out of medicine.  To make sure that the above recommendation works, it is very important that you make sure your medication refill appointments are scheduled at least 1 week before you run out of medicine. To do this in an effective manner, make sure that you do not leave the office without scheduling your next medication management appointment. Always ask the nursing staff to show you in your prescription , when your medication will be running out. Then arrange for the receptionist to get you a return appointment, at least 7 days before you run out of medicine. Do not wait until you have 1 or 2 pills left, to come in. This is very poor planning and does not take into consideration that we may need to cancel appointments due to bad weather, sickness, or emergencies affecting our staff.  Prescription refills and/or changes in medication(s):   Prescription refills, and/or changes in dose or medication, will be conducted only during scheduled medication management appointments. (Applies to both, written and electronic prescriptions.)  No refills on procedure days. No medication will be changed or started on procedure days. No changes, adjustments, and/or refills will be conducted on a procedure day. Doing so will interfere with  the diagnostic portion of the procedure.  No phone refills. No medications will be "called into the pharmacy".  No Fax refills.  No weekend refills.  No Holliday refills.  No after hours refills.  Remember:  Business hours are:  Monday to Thursday 8:00 AM to 4:00 PM Provider's Schedule: Thad Ranger, NP - Appointments are:  Medication management: Monday to Thursday 8:00 AM to 4:00 PM Delano Metz, MD - Appointments are:  Medication management: Monday and Wednesday 8:00 AM to 4:00 PM Procedure day: Tuesday and Thursday 7:30 AM to 4:00 PM Edward Jolly, MD - Appointments are:  Medication management: Tuesday and Thursday 8:00 AM to 4:00 PM Procedure day: Monday and Wednesday 7:30 AM to 4:00 PM (Last update: 02/14/2018) ____________________________________________________________________________________________  Occipital Nerve Block Patient Information  Description: The occipital nerves originate in the cervical (neck) spinal cord and travel upward through muscle and tissue to supply sensation to the back of the head and top of the scalp.  In addition, the nerves control some of the muscles of the scalp.  Occipital neuralgia is an irritation of these nerves which can cause headaches, numbness of the scalp, and neck discomfort.     The occipital nerve block will interrupt nerve transmission through these nerves and can relieve pain and spasm.  The block consists of insertion of a small needle under the skin in the back of the head to deposit local anesthetic (numbing medicine) and/or steroids around the nerve.  The entire block usually lasts  less than 5 minutes.  Conditions which may be treated by occipital blocks:   Muscular pain and spasm of the scalp  Nerve irritation, back of the head  Headaches  Upper neck pain  Preparation for the injection:  11. Do not eat any solid food or dairy products within 8 hours of your appointment. 12. You may drink clear liquids up to 3  hours before appointment.  Clear liquids include water, black coffee, juice or soda.  No milk or cream please. 13. You may take your regular medication, including pain medications, with a sip of water before you appointment.  Diabetics should hold regular insulin (if taken separately) and take 1/2 normal NPH dose the morning of the procedure.  Carry some sugar containing items with you to your appointment. 14. A driver must accompany you and be prepared to drive you home after your procedure. 15. Bring all your current medications with you. 16. An IV may be inserted and sedation may be given at the discretion of the physician. 17. A blood pressure cuff, EKG, and other monitors will often be applied during the procedure.  Some patients may need to have extra oxygen administered for a short period. 18. You will be asked to provide medical information, including your allergies and medications, prior to the procedure.  We must know immediately if you are taking blood thinners (like Coumadin/Warfarin) or if you are allergic to IV iodine contrast (dye).  We must know if you could possible be pregnant.  19. Do not wear a high collared shirt or turtleneck.  Tie long hair up in the back if possible.  Possible side-effects:   Bleeding from needle site  Infection (rare, may require surgery)  Nerve injury (rare)  Hair on back of neck can be tinged with iodine scrub (this will wash out)  Light-headedness (temporary)  Pain at injection site (several days)  Decreased blood pressure (rare, temporary)  Seizure (very rare)  Call if you experience:   Hives or difficulty breathing ( go to the emergency room)  Inflammation or drainage at the injection site(s)  Please note:  Although the local anesthetic injected can often make your painful muscles or headache feel good for several hours after the injection, the pain may return.  It takes 3-7 days for steroids to work.  You may not notice any pain  relief for at least one week.  If effective, we will often do a series of injections spaced 3-6 weeks apart to maximally decrease your pain.  If you have any questions, please call [phone] [facility] GENERAL RISKS AND COMPLICATIONS  What are the risk, side effects and possible complications? Generally speaking, most procedures are safe.  However, with any procedure there are risks, side effects, and the possibility of complications.  The risks and complications are dependent upon the sites that are lesioned, or the type of nerve block to be performed.  The closer the procedure is to the spine, the more serious the risks are.  Great care is taken when placing the radio frequency needles, block needles or lesioning probes, but sometimes complications can occur. 1. Infection: Any time there is an injection through the skin, there is a risk of infection.  This is why sterile conditions are used for these blocks.  There are four possible types of infection. 1. Localized skin infection. 2. Central Nervous System Infection-This can be in the form of Meningitis, which can be deadly. 3. Epidural Infections-This can be  in the form of an epidural abscess, which can cause pressure inside of the spine, causing compression of the spinal cord with subsequent paralysis. This would require an emergency surgery to decompress, and there are no guarantees that the patient would recover from the paralysis. 4. Discitis-This is an infection of the intervertebral discs.  It occurs in about 1% of discography procedures.  It is difficult to treat and it may lead to surgery.        2. Pain: the needles have to go through skin and soft tissues, will cause soreness.       3. Damage to internal structures:  The nerves to be lesioned may be near blood vessels or    other nerves which can be potentially damaged.       4. Bleeding: Bleeding is more common if the patient is taking blood  thinners such as  aspirin, Coumadin, Ticiid, Plavix, etc., or if he/she have some genetic predisposition  such as hemophilia. Bleeding into the spinal canal can cause compression of the spinal  cord with subsequent paralysis.  This would require an emergency surgery to  decompress and there are no guarantees that the patient would recover from the  paralysis.       5. Pneumothorax:  Puncturing of a lung is a possibility, every time a needle is introduced in  the area of the chest or upper back.  Pneumothorax refers to free air around the  collapsed lung(s), inside of the thoracic cavity (chest cavity).  Another two possible  complications related to a similar event would include: Hemothorax and Chylothorax.   These are variations of the Pneumothorax, where instead of air around the collapsed  lung(s), you may have blood or chyle, respectively.       6. Spinal headaches: They may occur with any procedures in the area of the spine.       7. Persistent CSF (Cerebro-Spinal Fluid) leakage: This is a rare problem, but may occur  with prolonged intrathecal or epidural catheters either due to the formation of a fistulous  track or a dural tear.       8. Nerve damage: By working so close to the spinal cord, there is always a possibility of  nerve damage, which could be as serious as a permanent spinal cord injury with  paralysis.       9. Death:  Although rare, severe deadly allergic reactions known as "Anaphylactic  reaction" can occur to any of the medications used.      10. Worsening of the symptoms:  We can always make thing worse.  What are the chances of something like this happening? Chances of any of this occuring are extremely low.  By statistics, you have more of a chance of getting killed in a motor vehicle accident: while driving to the hospital than any of the above occurring .  Nevertheless, you should be aware that they are possibilities.  In general, it is similar to taking a shower.  Everybody knows  that you can slip, hit your head and get killed.  Does that mean that you should not shower again?  Nevertheless always keep in mind that statistics do not mean anything if you happen to be on the wrong side of them.  Even if a procedure has a 1 (one) in a 1,000,000 (million) chance of going wrong, it you happen to be that one..Also, keep in mind that by statistics, you have more of a chance of having something  go wrong when taking medications.  Who should not have this procedure? If you are on a blood thinning medication (e.g. Coumadin, Plavix, see list of "Blood Thinners"), or if you have an active infection going on, you should not have the procedure.  If you are taking any blood thinners, please inform your physician.  How should I prepare for this procedure?  Do not eat or drink anything at least six hours prior to the procedure.  Bring a driver with you .  It cannot be a taxi.  Come accompanied by an adult that can drive you back, and that is strong enough to help you if your legs get weak or numb from the local anesthetic.  Take all of your medicines the morning of the procedure with just enough water to swallow them.  If you have diabetes, make sure that you are scheduled to have your procedure done first thing in the morning, whenever possible.  If you have diabetes, take only half of your insulin dose and notify our nurse that you have done so as soon as you arrive at the clinic.  If you are diabetic, but only take blood sugar pills (oral hypoglycemic), then do not take them on the morning of your procedure.  You may take them after you have had the procedure.  Do not take aspirin or any aspirin-containing medications, at least eleven (11) days prior to the procedure.  They may prolong bleeding.  Wear loose fitting clothing that may be easy to take off and that you would not mind if it got stained with Betadine or blood.  Do not wear any jewelry or perfume  Remove any nail  coloring.  It will interfere with some of our monitoring equipment.  NOTE: Remember that this is not meant to be interpreted as a complete list of all possible complications.  Unforeseen problems may occur.  BLOOD THINNERS The following drugs contain aspirin or other products, which can cause increased bleeding during surgery and should not be taken for 2 weeks prior to and 1 week after surgery.  If you should need take something for relief of minor pain, you may take acetaminophen which is found in Tylenol,m Datril, Anacin-3 and Panadol. It is not blood thinner. The products listed below are.  Do not take any of the products listed below in addition to any listed on your instruction sheet.  A.P.C or A.P.C with Codeine Codeine Phosphate Capsules #3 Ibuprofen Ridaura  ABC compound Congesprin Imuran rimadil  Advil Cope Indocin Robaxisal  Alka-Seltzer Effervescent Pain Reliever and Antacid Coricidin or Coricidin-D  Indomethacin Rufen  Alka-Seltzer plus Cold Medicine Cosprin Ketoprofen S-A-C Tablets  Anacin Analgesic Tablets or Capsules Coumadin Korlgesic Salflex  Anacin Extra Strength Analgesic tablets or capsules CP-2 Tablets Lanoril Salicylate  Anaprox Cuprimine Capsules Levenox Salocol  Anexsia-D Dalteparin Magan Salsalate  Anodynos Darvon compound Magnesium Salicylate Sine-off  Ansaid Dasin Capsules Magsal Sodium Salicylate  Anturane Depen Capsules Marnal Soma  APF Arthritis pain formula Dewitt's Pills Measurin Stanback  Argesic Dia-Gesic Meclofenamic Sulfinpyrazone  Arthritis Bayer Timed Release Aspirin Diclofenac Meclomen Sulindac  Arthritis pain formula Anacin Dicumarol Medipren Supac  Analgesic (Safety coated) Arthralgen Diffunasal Mefanamic Suprofen  Arthritis Strength Bufferin Dihydrocodeine Mepro Compound Suprol  Arthropan liquid Dopirydamole Methcarbomol with Aspirin Synalgos  ASA tablets/Enseals Disalcid Micrainin Tagament  Ascriptin Doan's Midol Talwin  Ascriptin A/D Dolene  Mobidin Tanderil  Ascriptin Extra Strength Dolobid Moblgesic Ticlid  Ascriptin with Codeine Doloprin or Doloprin with Codeine Momentum Tolectin  Asperbuf  Duoprin Mono-gesic Trendar  Aspergum Duradyne Motrin or Motrin IB Triminicin  Aspirin plain, buffered or enteric coated Durasal Myochrisine Trigesic  Aspirin Suppositories Easprin Nalfon Trillsate  Aspirin with Codeine Ecotrin Regular or Extra Strength Naprosyn Uracel  Atromid-S Efficin Naproxen Ursinus  Auranofin Capsules Elmiron Neocylate Vanquish  Axotal Emagrin Norgesic Verin  Azathioprine Empirin or Empirin with Codeine Normiflo Vitamin E  Azolid Emprazil Nuprin Voltaren  Bayer Aspirin plain, buffered or children's or timed BC Tablets or powders Encaprin Orgaran Warfarin Sodium  Buff-a-Comp Enoxaparin Orudis Zorpin  Buff-a-Comp with Codeine Equegesic Os-Cal-Gesic   Buffaprin Excedrin plain, buffered or Extra Strength Oxalid   Bufferin Arthritis Strength Feldene Oxphenbutazone   Bufferin plain or Extra Strength Feldene Capsules Oxycodone with Aspirin   Bufferin with Codeine Fenoprofen Fenoprofen Pabalate or Pabalate-SF   Buffets II Flogesic Panagesic   Buffinol plain or Extra Strength Florinal or Florinal with Codeine Panwarfarin   Buf-Tabs Flurbiprofen Penicillamine   Butalbital Compound Four-way cold tablets Penicillin   Butazolidin Fragmin Pepto-Bismol   Carbenicillin Geminisyn Percodan   Carna Arthritis Reliever Geopen Persantine   Carprofen Gold's salt Persistin   Chloramphenicol Goody's Phenylbutazone   Chloromycetin Haltrain Piroxlcam   Clmetidine heparin Plaquenil   Cllnoril Hyco-pap Ponstel   Clofibrate Hydroxy chloroquine Propoxyphen         Before stopping any of these medications, be sure to consult the physician who ordered them.  Some, such as Coumadin (Warfarin) are ordered to prevent or treat serious conditions such as "deep thrombosis", "pumonary embolisms", and other heart problems.  The amount of time that  you may need off of the medication may also vary with the medication and the reason for which you were taking it.  If you are taking any of these medications, please make sure you notify your pain physician before you undergo any procedures.

## 2018-02-25 NOTE — Progress Notes (Signed)
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  diclofenac sodium (VOLTAREN) 1 % GEL, Apply 2 g topically 4 (four) times daily., Disp: , Rfl:  .  escitalopram (LEXAPRO) 20 MG tablet, Take 20 mg by mouth daily., Disp: , Rfl:  .  hydrochlorothiazide (HYDRODIURIL) 25 MG tablet, Take 25 mg by mouth daily., Disp: , Rfl:  .  HYDROcodone-acetaminophen (NORCO) 10-325 MG tablet, Take 1 tablet by mouth every 8 (eight) hours as needed for severe pain., Disp: 90 tablet, Rfl: 0 .  hydrOXYzine (ATARAX/VISTARIL) 10 MG tablet, Take 10 mg by mouth 3 (three) times daily as needed., Disp: , Rfl:  .  ipratropium (ATROVENT) 0.02 % nebulizer solution, Take 0.5 mg by nebulization every 4 (four) hours as needed for wheezing or shortness of breath., Disp: , Rfl:  .  ketoconazole (NIZORAL) 2 % shampoo, Apply 1 application topically 2 (two) times a week., Disp: , Rfl:  .  levETIRAcetam (KEPPRA) 750 MG tablet, Take  750 mg by mouth daily., Disp: , Rfl:  .  losartan (COZAAR) 25 MG tablet, Take 25 mg by mouth daily., Disp: , Rfl:  .  lubiprostone (AMITIZA) 24 MCG capsule, Take 24 mcg by mouth 2 (two) times daily with a meal., Disp: , Rfl:  .  mirtazapine (REMERON) 15 MG tablet, Take 15 mg by mouth at bedtime., Disp: , Rfl:  .  nitroGLYCERIN (NITROSTAT) 0.4 MG SL tablet, Place 0.4 mg under the tongue every 5 (five) minutes as needed for chest pain., Disp: , Rfl:  .  pantoprazole (PROTONIX) 40 MG tablet, Take 40 mg by mouth daily., Disp: , Rfl:  .  potassium chloride SA (K-DUR,KLOR-CON) 20 MEQ tablet, Take 20 mEq by mouth 2 (two) times daily., Disp: , Rfl:  .  sucralfate (CARAFATE) 1 g tablet, Take 1 g by mouth 4 (four) times daily., Disp: , Rfl:  .  SUMAtriptan (IMITREX) 25 MG tablet, Take 25 mg by mouth every 2 (two) hours as needed for migraine. May repeat in 2 hours if headache persists or recurs., Disp: , Rfl:  .  tiZANidine (ZANAFLEX) 4 MG tablet, Take 1 tablet (4 mg total) by mouth 3 (three) times daily., Disp: 90 tablet, Rfl: 0 .  topiramate (TOPAMAX) 50 MG tablet, Take 50 mg by mouth 2 (two) times daily., Disp: , Rfl:  .  traZODone (  DESYREL) 50 MG tablet, Take 50 mg by mouth at bedtime., Disp: , Rfl:  .  ergocalciferol (VITAMIN D2) 50000 units capsule, Take 1 capsule (50,000 Units total) by mouth 2 (two) times a week. X 6 weeks. Take with 600 mg of OTC Calcium, Disp: 12 capsule, Rfl: 0  ROS  Constitutional: Denies any fever or chills Gastrointestinal: No reported hemesis, hematochezia, vomiting, or acute GI distress Musculoskeletal: Denies any acute onset joint swelling, redness, loss of ROM, or weakness Neurological: No reported episodes of acute onset apraxia, aphasia, dysarthria, agnosia, amnesia, paralysis, loss of coordination, or loss of consciousness  Allergies  Jean Gomez is allergic to gabapentin; labetalol; sulfabenzamide; fentanyl; and penicillins.  [place]  Drug: Jean Gomez  reports that she  does not use drugs. Alcohol:  reports that she drinks alcohol. Tobacco:  reports that  has never smoked. she has never used smokeless tobacco. Medical:  has a past medical history of Asthma, Cerebral aneurysm, Chest pain (11/01/2016), Chronic migraine, Chronic respiratory failure ([place]), COPD (chronic obstructive pulmonary disease) ([place]), Hypertension, Osteoarthritis, Sarcoid, and Seizures ([place]). Surgical: Ms. Rusk  has a past surgical history that includes Cholecystectomy; Abdominal hysterectomy; and Spinal fusion. Family: family history is not on file.  Constitutional Exam  General appearance: Well nourished, well developed, and well hydrated. In no apparent acute distress Vitals:   02/25/18 0927  BP: 140/85  Pulse: (!) 105  Resp: 18  Temp: 98.6 F (37 C)  TempSrc: Oral  SpO2: 98%  Weight: 170 lb (77.1 kg)  Height: _0  (1.626 m)   BMI Assessment: Estimated body mass index is 29.18 kg/m as calculated from the following:   Height as of this encounter: _1  (1.626 m).   Weight as of this encounter: 170 lb (77.1 kg).  BMI interpretation table: BMI level Category Range association with higher incidence of chronic pain  <18 kg/m2 Underweight   18.5-24.9 kg/m2 Ideal body weight   25-29.9 kg/m2 Overweight Increased incidence by 20%  30-34.9 kg/m2 Obese (Class I) Increased incidence by 68%  35-39.9 kg/m2 Severe obesity (Class II) Increased incidence by 136%  >40 kg/m2 Extreme obesity (Class III) Increased incidence by 254%   BMI Readings from Last 4 Encounters:  02/25/18 29.18 kg/m  02/18/18 29.52 kg/m  02/07/18 29.52 kg/m   Wt Readings from Last 4 Encounters:  02/25/18 170 lb (77.1 kg)  02/18/18 172 lb (78 kg)  02/07/18 172 lb (78 kg)  Psych/Mental status: Alert, oriented x 3 (person, place, & time)       Eyes: PERLA Respiratory: Oxygen-dependent COPD  Cervical Spine Area Exam  Skin & Axial Inspection: No masses, redness, edema, swelling, or associated skin  lesions Alignment: Symmetrical Functional ROM: Decreased ROM      Stability: No instability detected Muscle Tone/Strength: Functionally intact. No obvious neuro-muscular anomalies detected. Sensory (Neurological): Movement-associated pain Palpation: Tender to palpation over both greater occipital nerves, with reproduction of headaches (Bilateral) (R>L)              Upper Extremity (UE) Exam    Side: Right upper extremity  Side: Left upper extremity  Skin & Extremity Inspection: Skin color, temperature, and hair growth are WNL. No peripheral edema or cyanosis. No masses, redness, swelling, asymmetry, or associated skin lesions. No contractures.  Skin & Extremity Inspection: Skin color, temperature, and hair growth are WNL. No peripheral edema or cyanosis. No masses, redness, swelling, asymmetry, or associated skin lesions. No contractures.  Functional ROM: Unrestricted ROM  Functional ROM: Unrestricted ROM          Muscle Tone/Strength: Functionally intact. No obvious neuro-muscular anomalies detected.  Muscle Tone/Strength: Functionally intact. No obvious neuro-muscular anomalies detected.  Sensory (Neurological): Unimpaired          Sensory (Neurological): Unimpaired          Palpation: No palpable anomalies              Palpation: No palpable anomalies              Specialized Test(s): Deferred         Specialized Test(s): Deferred          Thoracic Spine Area Exam  Skin & Axial Inspection: No masses, redness, or swelling Alignment: Symmetrical Functional ROM: Unrestricted ROM Stability: No instability detected Muscle Tone/Strength: Functionally intact. No obvious neuro-muscular anomalies detected. Sensory (Neurological): Unimpaired Muscle strength & Tone: No palpable anomalies  Lumbar Spine Area Exam  Skin & Axial Inspection: No masses, redness, or swelling Alignment: Symmetrical Functional ROM: Decreased ROM      Stability: No instability detected Muscle Tone/Strength:  Functionally intact. No obvious neuro-muscular anomalies detected. Sensory (Neurological): Movement-associated pain Palpation: Complains of area being tender to palpation       Provocative Tests: Lumbar Hyperextension and rotation test: Positive bilaterally for facet joint pain. Lumbar Lateral bending test: evaluation deferred today       Patrick's Maneuver: Positive             for bilateral hip arthralgia  Gait & Posture Assessment  Ambulation: Patient ambulates using a cane Gait: Limited. Using assistive device to ambulate Posture: Antalgic   Lower Extremity Exam    Side: Right lower extremity  Side: Left lower extremity  Skin & Extremity Inspection: Skin color, temperature, and hair growth are WNL. No peripheral edema or cyanosis. No masses, redness, swelling, asymmetry, or associated skin lesions. No contractures.  Skin & Extremity Inspection: Skin color, temperature, and hair growth are WNL. No peripheral edema or cyanosis. No masses, redness, swelling, asymmetry, or associated skin lesions. No contractures.  Functional ROM: Unrestricted ROM          Functional ROM: Unrestricted ROM          Muscle Tone/Strength: Functionally intact. No obvious neuro-muscular anomalies detected.  Muscle Tone/Strength: Functionally intact. No obvious neuro-muscular anomalies detected.  Sensory (Neurological): Unimpaired  Sensory (Neurological): Unimpaired  Palpation: No palpable anomalies  Palpation: No palpable anomalies   Assessment & Plan  Primary Diagnosis & Pertinent Problem List: The primary encounter diagnosis was Chronic pain syndrome. 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, Cervical Grade 1 Retrolisthesis of C5 over C6, Cervical facet syndrome (Bilateral) (R>L), Cervical facet arthropathy (Bilateral), Cervical foraminal stenosis (C3-4, C4-5, and C5-6) (Bilateral), Chronic low back pain (Midline) (Secondary Area of Pain), Chronic low back pain (Bilateral) with right-sided sciatica (Secondary Area of Pain) (R>L), DDD (degenerative disc disease), lumbar, Osteoarthritis of lumbar spine, Spondylosis of lumbar region without myelopathy or radiculopathy, Osteoarthritis of facet joint of lumbar spine, Chronic sacroiliac joint pain (Right), Lumbar facet joint syndrome (Bilateral) (R>L), Chronic lower extremity pain (Tertiary Area of Pain) (Bilateral) (R>L), Chronic lumbar radiculopathy (Right), Chronic hip pain (Tertiary Area of Pain) (Bilateral) (R>L), Chronic  knee pain (Fourth Area of Pain) (Bilateral) (R>L), Osteoarthritis of knee (Bilateral) (R>L), Chronic shoulder pain (Fifth Area of Pain) (Bilateral) (R>L), Osteoarthritis of shoulder (Bilateral), Chronic shoulder pain (Right), Chronic musculoskeletal pain, Disorder of skeletal system, Pharmacologic therapy, Long term current use of opiate analgesic, Problems influencing health status, Vitamin D deficiency, and Sarcoidosis were also pertinent to this visit.  Visit Diagnosis: 1. Chronic pain syndrome   2. Chronic headache disorder (Primary Area of Pain)   3. Nonruptured cerebral aneurysm   4. Occipital headache (Bilateral) (Right)   5. Occipital neuralgia (Primary Area of Pain) (Bilateral) (R>L)   6. Frontal headache (Left)   7. Chronic neck pain (Bilateral) (R>L)   8. DDD (degenerative disc disease), cervical   9. Spondylosis without myelopathy or radiculopathy, cervicothoracic region   10. 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 Cervical Grade 1 Retrolisthesis of C5 over C6   12. Cervical facet syndrome (Bilateral) (R>L)   13.  Cervical facet arthropathy (Bilateral)   14. Cervical foraminal stenosis (C3-4, C4-5, and C5-6) (Bilateral)   15. Chronic low back pain (Midline) (Secondary Area of Pain)   16. Chronic low back pain (Bilateral) with right-sided sciatica (Secondary Area of Pain) (R>L)   17. DDD (degenerative disc disease), lumbar   18. Osteoarthritis of lumbar spine   19. Spondylosis of lumbar region without myelopathy or radiculopathy   20. Osteoarthritis of facet joint of lumbar spine   21. Chronic sacroiliac joint pain (Right)   22. Lumbar facet joint syndrome (Bilateral) (R>L)   23. Chronic lower extremity pain (Tertiary Area of Pain) (Bilateral) (R>L)   24. Chronic lumbar radiculopathy (Right)   25. Chronic hip pain (Tertiary Area of Pain) (Bilateral) (R>L)   26. Chronic knee pain (Fourth Area of Pain) (Bilateral) (R>L)   27. Osteoarthritis of knee (Bilateral) (R>L)   28. Chronic shoulder pain (Fifth Area of Pain) (Bilateral) (R>L)   29. Osteoarthritis of shoulder (Bilateral)   30. Chronic shoulder pain (Right)   31. Chronic musculoskeletal pain   32. Disorder of skeletal system   33. Pharmacologic therapy   34. Long term current use of opiate analgesic   35. Problems influencing health status   36. Vitamin D deficiency   4. Sarcoidosis    Problems updated and reviewed during this visit: Problem  Chronic hip pain (Tertiary Area of Pain) (Bilateral) (R>L)  Ddd (Degenerative Disc Disease), Cervical  Cervical facet arthropathy (Bilateral)  Cervical facet syndrome (Bilateral) (R>L)  Cervical Grade 1 Anterolisthesis of C4 over C5 (Degenerative)  Cervical Grade 1 Retrolisthesis of C5 over C6  Cervical foraminal stenosis (C3-4, C4-5, and C5-6) (Bilateral)  Ddd (Degenerative Disc Disease), Lumbar  Osteoarthritis of lumbar spine  Osteoarthritis of Facet Joint of Lumbar Spine  Spondylosis Without Myelopathy Or Radiculopathy, Cervicothoracic Region   Time Note: Greater than 50% of the 40 minute(s)  of face-to-face time spent with Jean Gomez, was spent in counseling/coordination of care regarding: the appropriate use of the pain scale, Jean Gomez's primary cause of pain, the results of her recent test(s), the significance of each one oth the test(s) anomalies and it's corresponding characteristic pain pattern(s), the treatment plan, treatment alternatives, the risks and possible complications of proposed treatment, medication side effects, realistic expectations, the goals of pain management (increased in functionality), the medication agreement and the need to collect and read the AVS material.  Plan of Care  Pharmacotherapy (Medications Ordered): Meds ordered this encounter  Medications  . HYDROcodone-acetaminophen (NORCO) 10-325 MG  tablet    Sig: Take 1 tablet by mouth every 8 (eight) hours as needed for severe pain.    Dispense:  90 tablet    Refill:  0    Fill one day early if pharmacy is closed on scheduled refill date. Do not fill until: 02/25/18 To last until: 03/27/18  . tiZANidine (ZANAFLEX) 4 MG tablet    Sig: Take 1 tablet (4 mg total) by mouth 3 (three) times daily.    Dispense:  90 tablet    Refill:  0    Do not place medication on "Automatic Refill". Fill one day early if pharmacy is closed on scheduled refill date.  . ergocalciferol (VITAMIN D2) 50000 units capsule    Sig: Take 1 capsule (50,000 Units total) by mouth 2 (two) times a week. X 6 weeks. Take with 600 mg of OTC Calcium    Dispense:  12 capsule    Refill:  0    Do not add this medication to the electronic "Automatic Refill" notification system. Patient may have prescription filled one day early if pharmacy is closed on scheduled refill date.    Procedure Orders     GREATER OCCIPITAL NERVE BLOCK Lab Orders  No laboratory test(s) ordered today    Imaging Orders     DG Cervical Spine Complete  Referral Orders     Ambulatory referral to Neurosurgery     Ambulatory referral to Physical  Therapy  Pharmacological management options:  Opioid Analgesics: We'll take over management today. See above orders Membrane stabilizer: We have discussed the possibility of optimizing this mode of therapy, if tolerated Muscle relaxant: We have discussed the possibility of a trial NSAID: We have discussed the possibility of a trial Other analgesic(s): To be determined at a later time   Interventional management options: Planned, scheduled, and/or pending:    Diagnostic bilateral greater occipital nerve block #1 under fluoroscopic guidance and IV sedation (no steroids) vs. diagnostic right-sided CESI #1, under fluoroscopic guidance and IV sedation   Considering:   Diagnostic bilateral greater occipital nerve block  Possible bilateral occipital nerve RFA  Possible bilateral occipital nerve peripheral nerves stimulator trial  Diagnostic bilateral cervical facet block  Possible bilateral cervical facet RFA  Diagnostic right-sided cervical epidural steroid injection  Diagnostic bilateral intra-articular shoulder joint injection  Diagnostic bilateral suprascapular nerve block  Possible bilateral suprascapular nerve RFA  Diagnostic bilateral lumbar facet block  Possible bilateral lumbar facet RFA  Diagnostic right-sided lumbar epidural steroid injection  Diagnostic bilateral transforaminal epidural steroid injection  Diagnostic bilateral intra-articular hip joint injection  Diagnostic bilateral femoral nerve + obturator nerve block  Possible bilateral femoral nerve + obturator nerve RFA  Diagnostic bilateral intra-articular knee joint injection with local anesthetic and steroid  Possible series of 5 bilateral intra-articular Hyalgan knee injections  Diagnostic bilateral genicular nerve block  Possible bilateral genicular nerve RFA    PRN Procedures:   None at this time   Provider-requested follow-up: Return for Procedure (w/ sedation): (B) GONB (NO STEROID).  Future Appointments   Date Time Provider [place]  03/05/2018  8:30 AM Milinda Pointer, MD [facility] None  03/27/2018  8:45 AM Vevelyn Francois, NP [facility] None    Primary Care Physician: Kirk Ruths, MD Location: [facility] Note by: Gaspar Cola, MD Date: 02/25/2018; Time: 11:08 AM

## 2018-02-26 DIAGNOSIS — M25551 Pain in right hip: Secondary | ICD-10-CM

## 2018-02-26 DIAGNOSIS — M25552 Pain in left hip: Secondary | ICD-10-CM

## 2018-02-26 DIAGNOSIS — M5136 Other intervertebral disc degeneration, lumbar region: Secondary | ICD-10-CM

## 2018-02-26 DIAGNOSIS — M47813 Spondylosis without myelopathy or radiculopathy, cervicothoracic region: Secondary | ICD-10-CM | POA: Insufficient documentation

## 2018-02-26 DIAGNOSIS — M47816 Spondylosis without myelopathy or radiculopathy, lumbar region: Secondary | ICD-10-CM | POA: Insufficient documentation

## 2018-02-26 DIAGNOSIS — G8929 Other chronic pain: Secondary | ICD-10-CM | POA: Insufficient documentation

## 2018-02-26 DIAGNOSIS — M5030 Other cervical disc degeneration, unspecified cervical region: Secondary | ICD-10-CM | POA: Insufficient documentation

## 2018-02-26 DIAGNOSIS — M4802 Spinal stenosis, cervical region: Secondary | ICD-10-CM | POA: Insufficient documentation

## 2018-02-26 DIAGNOSIS — M5137 Other intervertebral disc degeneration, lumbosacral region: Secondary | ICD-10-CM | POA: Insufficient documentation

## 2018-02-26 DIAGNOSIS — M4310 Spondylolisthesis, site unspecified: Secondary | ICD-10-CM | POA: Insufficient documentation

## 2018-02-26 DIAGNOSIS — M47812 Spondylosis without myelopathy or radiculopathy, cervical region: Secondary | ICD-10-CM | POA: Insufficient documentation

## 2018-03-05 ENCOUNTER — Encounter: Payer: Self-pay | Admitting: Pain Medicine

## 2018-03-05 ENCOUNTER — Ambulatory Visit (HOSPITAL_BASED_OUTPATIENT_CLINIC_OR_DEPARTMENT_OTHER): Payer: Medicare Other | Admitting: Pain Medicine

## 2018-03-05 ENCOUNTER — Other Ambulatory Visit: Payer: Self-pay

## 2018-03-05 ENCOUNTER — Ambulatory Visit
Admission: RE | Admit: 2018-03-05 | Discharge: 2018-03-05 | Disposition: A | Discharge status: Home / Self Care | Payer: Medicare Other | Source: Ambulatory Visit | Attending: Pain Medicine | Admitting: Pain Medicine

## 2018-03-05 VITALS — BP 150/82 | HR 95 | Temp 98.8°F | Resp 18 | Ht 64.0 in | Wt 170.0 lb

## 2018-03-05 DIAGNOSIS — M5481 Occipital neuralgia: Secondary | ICD-10-CM | POA: Diagnosis present

## 2018-03-05 DIAGNOSIS — M47813 Spondylosis without myelopathy or radiculopathy, cervicothoracic region: Secondary | ICD-10-CM

## 2018-03-05 DIAGNOSIS — R51 Headache: Secondary | ICD-10-CM | POA: Insufficient documentation

## 2018-03-05 DIAGNOSIS — M5030 Other cervical disc degeneration, unspecified cervical region: Secondary | ICD-10-CM

## 2018-03-05 DIAGNOSIS — M9981 Other biomechanical lesions of cervical region: Secondary | ICD-10-CM | POA: Insufficient documentation

## 2018-03-05 DIAGNOSIS — R519 Headache, unspecified: Secondary | ICD-10-CM

## 2018-03-05 DIAGNOSIS — M4802 Spinal stenosis, cervical region: Secondary | ICD-10-CM

## 2018-03-05 MED ORDER — MIDAZOLAM HCL 5 MG/5ML IJ SOLN
1.0000 mg | INTRAMUSCULAR | Status: DC | PRN
  Administered 2018-03-05: 2 mg via INTRAVENOUS
  Filled 2018-03-05: qty 5

## 2018-03-05 MED ORDER — LACTATED RINGERS IV SOLN
1000.0000 mL | Freq: Once | INTRAVENOUS | Status: DC
  Administered 2018-03-05: 1000 mL via INTRAVENOUS

## 2018-03-05 MED ORDER — FENTANYL CITRATE (PF) 100 MCG/2ML IJ SOLN
25.0000 ug | INTRAMUSCULAR | Status: DC | PRN
  Administered 2018-03-05: 75 ug via INTRAVENOUS
  Filled 2018-03-05: qty 2

## 2018-03-05 MED ORDER — DEXAMETHASONE SODIUM PHOSPHATE 10 MG/ML IJ SOLN
10.0000 mg | Freq: Once | INTRAMUSCULAR | Status: DC
  Administered 2018-03-05: 10 mg

## 2018-03-05 MED ORDER — LIDOCAINE HCL 2 % IJ SOLN
20.0000 mL | Freq: Once | INTRAMUSCULAR | Status: DC
  Administered 2018-03-05: 400 mg
  Filled 2018-03-05: qty 20

## 2018-03-05 MED ORDER — ROPIVACAINE HCL 2 MG/ML IJ SOLN
10.0000 mL | Freq: Once | INTRAMUSCULAR | Status: DC
  Administered 2018-03-05: 10 mL
  Filled 2018-03-05: qty 10

## 2018-03-05 MED ORDER — DEXAMETHASONE SODIUM PHOSPHATE 10 MG/ML IJ SOLN
INTRAMUSCULAR | Status: AC
  Filled 2018-03-05: qty 1

## 2018-03-05 NOTE — Addendum Note (Signed)
Addended by: Delano MetzNAVEIRA, Evia Goldsmith A on: 03/05/2018 11:34 AM   Modules accepted: Orders

## 2018-03-05 NOTE — Progress Notes (Signed)
Safety precautions to be maintained throughout the outpatient stay will include: orient to surroundings, keep bed in low position, maintain call bell within reach at all times, provide assistance with transfer out of bed and ambulation.  

## 2018-03-05 NOTE — Patient Instructions (Signed)

## 2018-03-05 NOTE — Progress Notes (Addendum)
Patient's Name: Jean Gomez  MRN: [id number]  Referring Provider: Lauro Regulus, MD  DOB: 08/18/1956  PCP: Lauro Regulus, MD  DOS: 03/05/2018  Note by: Oswaldo Done, MD  Service setting: Ambulatory outpatient  Specialty: Interventional Pain Management  Patient type: Established  Location: [facility] (AMB) Pain Management Facility  Visit type: Interventional Procedure   Primary Reason for Visit: Interventional Pain Management Treatment. CC: Migraine and Headache  Procedure:       Anesthesia, Analgesia, Anxiolysis:  Type: Diagnostic, Greater, Occipital Nerve Block  Region: Posterolateral Cervical Level: Occipital Ridge   Laterality: Bilateral  Type: Moderate (Conscious) Sedation combined with Local Anesthesia Indication(s): Analgesia and Anxiety Route: Intravenous (IV) IV Access: Secured Sedation: Meaningful verbal contact was maintained at all times during the procedure  Local Anesthetic: Lidocaine 1-2%   Indications: 1. Occipital neuralgia (Primary Area of Pain) (Bilateral) (R>L)   2. Chronic headache disorder (Primary Area of Pain)   3. DDD (degenerative disc disease), cervical   4. Spondylosis without myelopathy or radiculopathy, cervicothoracic region   5. Cervical foraminal stenosis (C3-4, C4-5, and C5-6) (Bilateral)    Pain Score: Pre-procedure: 5 /10 Post-procedure: 2 /10  Pre-op Assessment:  Jean Gomez is a 62 y.o. (year old), female patient, seen today for interventional treatment. She  has a past surgical history that includes Cholecystectomy; Abdominal hysterectomy; and Spinal fusion. Jean Gomez has a current medication list which includes the following prescription(s): albuterol, albuterol sulfate, alum hydroxide-mag trisilicate, amlodipine, aspirin ec, aspirin-calcium carbonate, atorvastatin, betamethasone (augmented), budesonide-formoterol, clotrimazole-betamethasone, diclofenac sodium, ergocalciferol, escitalopram, hydrochlorothiazide,  hydrocodone-acetaminophen, hydroxyzine, ipratropium, ketoconazole, levetiracetam, losartan, lubiprostone, mirtazapine, nitroglycerin, pantoprazole, potassium chloride sa, sucralfate, sumatriptan, tizanidine, topiramate, venlafaxine, and trazodone, and the following Facility-Administered Medications: dexamethasone, fentanyl, lactated ringers, lidocaine, midazolam, and ropivacaine (pf) 2 mg/ml (0.2%). Her primarily concern today is the Migraine and Headache  Today the patient decided to have the injection done with the steroid.  Her fear was that she would be gaining weight with that but apparently she has decided not to worry about this.  Initial Vital Signs:  Pulse Rate: 95 Temp: 98.8 F (37.1 C) Resp: 18 BP: 135/85 SpO2: 95 %  BMI: Estimated body mass index is 29.18 kg/m as calculated from the following:   Height as of this encounter: 5\' 4"  (1.626 m).   Weight as of this encounter: 170 lb (77.1 kg).  Risk Assessment: Allergies: Reviewed. She is allergic to gabapentin; labetalol; sulfabenzamide; fentanyl; and penicillins.  Allergy Precautions: None required Coagulopathies: Reviewed. None identified.  Blood-thinner therapy: None at this time Active Infection(s): Reviewed. None identified. Jean Gomez is afebrile  Site Confirmation: Jean Gomez was asked to confirm the procedure and laterality before marking the site Procedure checklist: Completed Consent: Before the procedure and under the influence of no sedative(s), amnesic(s), or anxiolytics, the patient was informed of the treatment options, risks and possible complications. To fulfill our ethical and legal obligations, as recommended by the American Medical Association's Code of Ethics, I have informed the patient of my clinical impression; the nature and purpose of the treatment or procedure; the risks, benefits, and possible complications of the intervention; the alternatives, including doing nothing; the risk(s) and benefit(s) of the  alternative treatment(s) or procedure(s); and the risk(s) and benefit(s) of doing nothing. The patient was provided information about the general risks and possible complications associated with the procedure. These may include, but are not limited to: failure to achieve desired goals, infection, bleeding, organ or nerve damage, allergic reactions, paralysis, and death. In  addition, the patient was informed of those risks and complications associated to the procedure, such as failure to decrease pain; infection; bleeding; organ or nerve damage with subsequent damage to sensory, motor, and/or autonomic systems, resulting in permanent pain, numbness, and/or weakness of one or several areas of the body; allergic reactions; (i.e.: anaphylactic reaction); and/or death. Furthermore, the patient was informed of those risks and complications associated with the medications. These include, but are not limited to: allergic reactions (i.e.: anaphylactic or anaphylactoid reaction(s)); adrenal axis suppression; blood sugar elevation that in diabetics may result in ketoacidosis or comma; water retention that in patients with history of congestive heart failure may result in shortness of breath, pulmonary edema, and decompensation with resultant heart failure; weight gain; swelling or edema; medication-induced neural toxicity; particulate matter embolism and blood vessel occlusion with resultant organ, and/or nervous system infarction; and/or aseptic necrosis of one or more joints. Finally, the patient was informed that Medicine is not an exact science; therefore, there is also the possibility of unforeseen or unpredictable risks and/or possible complications that may result in a catastrophic outcome. The patient indicated having understood very clearly. We have given the patient no guarantees and we have made no promises. Enough time was given to the patient to ask questions, all of which were answered to the patient's  satisfaction. Jean Gomez has indicated that she wanted to continue with the procedure. Attestation: I, the ordering provider, attest that I have discussed with the patient the benefits, risks, side-effects, alternatives, likelihood of achieving goals, and potential problems during recovery for the procedure that I have provided informed consent. Date  Time: 03/05/2018  8:58 AM  Pre-Procedure Preparation:  Monitoring: As per clinic protocol. Respiration, ETCO2, SpO2, BP, heart rate and rhythm monitor placed and checked for adequate function Safety Precautions: Patient was assessed for positional comfort and pressure points before starting the procedure. Time-out: I initiated and conducted the "Time-out" before starting the procedure, as per protocol. The patient was asked to participate by confirming the accuracy of the "Time Out" information. Verification of the correct person, site, and procedure were performed and confirmed by me, the nursing staff, and the patient. "Time-out" conducted as per Joint Commission's Universal Protocol (UP.01.01.01). Time: 1016  Description of Procedure Process:   Position: Prone Target Area: Area medial to the occipital artery at the level of the superior nuchal ridge Approach: Posterior approach Area Prepped: Entire Posterior Occipital Region Prepping solution: ChloraPrep (2% chlorhexidine gluconate and 70% isopropyl alcohol) Safety Precautions: Aspiration looking for blood return was conducted prior to all injections. At no point did we inject any substances, as a needle was being advanced. No attempts were made at seeking any paresthesias. Safe injection practices and needle disposal techniques used. Medications properly checked for expiration dates. SDV (single dose vial) medications used. Description of the Procedure: Protocol guidelines were followed. The target area was identified and the area prepped in the usual manner. Skin & deeper tissues infiltrated with  local anesthetic. Appropriate amount of time allowed to pass for local anesthetics to take effect. The procedure needles were then advanced to the target area. Proper needle placement secured. Negative aspiration confirmed. Solution injected in intermittent fashion, asking for systemic symptoms every 0.5cc of injectate. The needles were then removed and the area cleansed, making sure to leave some of the prepping solution back to take advantage of its long term bactericidal properties.  Vitals:   03/05/18 1026 03/05/18 1036 03/05/18 1046 03/05/18 1055  BP: (!) 143/94 140/88 Marland Kitchen(!)  163/86 (!) 150/82  Pulse:      Resp: 11 15 16 18   Temp:      TempSrc:      SpO2: 100% 100% 100% 100%  Weight:      Height:        Start Time: 1016 hrs. End Time: 1025 hrs. Materials:  Needle(s) Type: Regular needle Gauge: 22G Length: 3.5-in Medication(s): Please see orders for medications and dosing details.  Imaging Guidance (Non-Spinal):  Type of Imaging Technique: Fluoroscopy Guidance (Non-Spinal) Indication(s): Assistance in needle guidance and placement for procedures requiring needle placement in or near specific anatomical locations not easily accessible without such assistance. Exposure Time: Please see nurses notes. Contrast: None used. Fluoroscopic Guidance: I was personally present during the use of fluoroscopy. "Tunnel Vision Technique" used to obtain the best possible view of the target area. Parallax error corrected before commencing the procedure. "Direction-depth-direction" technique used to introduce the needle under continuous pulsed fluoroscopy. Once target was reached, antero-posterior, oblique, and lateral fluoroscopic projection used confirm needle placement in all planes. Images permanently stored in EMR. Interpretation: No contrast injected. I personally interpreted the imaging intraoperatively. Adequate needle placement confirmed in multiple planes. Permanent images saved into the patient's  record.  Antibiotic Prophylaxis:   Anti-infectives (From admission, onward)   None     Indication(s): None identified  Post-operative Assessment:  Post-procedure Vital Signs:  Pulse Rate: 95 Temp: 98.8 F (37.1 C) Resp: 18 BP: (!) 150/82 SpO2: 100 %  EBL: None  Complications: No immediate post-treatment complications observed by team, or reported by patient.  Note: The patient tolerated the entire procedure well. A repeat set of vitals were taken after the procedure and the patient was kept under observation following institutional policy, for this type of procedure. Post-procedural neurological assessment was performed, showing return to baseline, prior to discharge. The patient was provided with post-procedure discharge instructions, including a section on how to identify potential problems. Should any problems arise concerning this procedure, the patient was given instructions to immediately contact us, at any time, without hesitation. In any case, we plan to contact the patient by telephone for a follow-up status report regarding this interventional procedure.  Comments:  No additional relevant information.  Plan of Care    Imaging Orders     DG C-Arm 1-60 Min-No Report  Procedure Orders     GREATER OCCIPITAL NERVE BLOCK  Medications ordered for procedure: Meds ordered this encounter  Medications  . lidocaine (XYLOCAINE) 2 % (with pres) injection 400 mg  . midazolam (VERSED) 5 MG/5ML injection 1-2 mg    Make sure Flumazenil is available in the pyxis when using this medication. If oversedation occurs, administer 0.2 mg IV over 15 sec. If after 45 sec no response, administer 0.2 mg again over 1 min; may repeat at 1 min intervals; not to exceed 4 doses (1 mg)  . fentaNYL (SUBLIMAZE) injection 25-50 mcg    Make sure Narcan is available in the pyxis when using this medication. In the event of respiratory depression (RR< 8/min): Titrate NARCAN (naloxone) in increments of 0.1  to 0.2 mg IV at 2-3 minute intervals, until desired degree of reversal.  . lactated ringers infusion 1,000 mL  . ropivacaine (PF) 2 mg/mL (0.2%) (NAROPIN) injection 10 mL  . dexamethasone (DECADRON) injection 10 mg   Medications administered: We administered midazolam and fentaNYL.  See the medical record for exact dosing, route, and time of administration.  New Prescriptions   No medications on file  Disposition: Discharge home  Discharge Date & Time: 03/05/2018; 1100 hrs.   Physician-requested Follow-up: Return for post-procedure eval (2 wks), w/ Dr. Laban Emperor.  Future Appointments  Date Time Provider Department Center  03/27/2018  8:45 AM Barbette Merino, NP [facility] None   Primary Care Physician: Lauro Regulus, MD Location: [facility] Outpatient Pain Management Facility Note by: Oswaldo Done, MD Date: 03/05/2018; Time: 11:33 AM  Disclaimer:  Medicine is not an exact science. The only guarantee in medicine is that nothing is guaranteed. It is important to note that the decision to proceed with this intervention was based on the information collected from the patient. The Data and conclusions were drawn from the patient's questionnaire, the interview, and the physical examination. Because the information was provided in large part by the patient, it cannot be guaranteed that it has not been purposely or unconsciously manipulated. Every effort has been made to obtain as Gomez relevant data as possible for this evaluation. It is important to note that the conclusions that lead to this procedure are derived in large part from the available data. Always take into account that the treatment will also be dependent on availability of resources and existing treatment guidelines, considered by other Pain Management Practitioners as being common knowledge and practice, at the time of the intervention. For Medico-Legal purposes, it is also important to point out that variation in procedural  techniques and pharmacological choices are the acceptable norm. The indications, contraindications, technique, and results of the above procedure should only be interpreted and judged by a Board-Certified Interventional Pain Specialist with extensive familiarity and expertise in the same exact procedure and technique.

## 2018-03-06 ENCOUNTER — Telehealth: Payer: Self-pay

## 2018-03-06 NOTE — Telephone Encounter (Signed)
Procedure phone call.  Patient states she is doing OK.

## 2018-03-27 ENCOUNTER — Other Ambulatory Visit: Payer: Self-pay

## 2018-03-27 ENCOUNTER — Other Ambulatory Visit: Payer: Self-pay | Admitting: Nurse Practitioner

## 2018-03-27 ENCOUNTER — Encounter: Payer: Self-pay | Admitting: Nurse Practitioner

## 2018-03-27 ENCOUNTER — Ambulatory Visit: Payer: Medicare Other | Attending: Nurse Practitioner | Admitting: Nurse Practitioner

## 2018-03-27 VITALS — BP 142/79 | HR 109 | Temp 98.0°F | Resp 16 | Ht 64.0 in | Wt 169.0 lb

## 2018-03-27 DIAGNOSIS — M545 Low back pain: Secondary | ICD-10-CM | POA: Diagnosis not present

## 2018-03-27 DIAGNOSIS — G894 Chronic pain syndrome: Secondary | ICD-10-CM | POA: Diagnosis not present

## 2018-03-27 DIAGNOSIS — R519 Headache, unspecified: Secondary | ICD-10-CM

## 2018-03-27 DIAGNOSIS — M5030 Other cervical disc degeneration, unspecified cervical region: Secondary | ICD-10-CM

## 2018-03-27 DIAGNOSIS — M170 Bilateral primary osteoarthritis of knee: Secondary | ICD-10-CM | POA: Diagnosis not present

## 2018-03-27 DIAGNOSIS — Z79899 Other long term (current) drug therapy: Secondary | ICD-10-CM | POA: Diagnosis not present

## 2018-03-27 DIAGNOSIS — G8929 Other chronic pain: Secondary | ICD-10-CM

## 2018-03-27 DIAGNOSIS — M7918 Myalgia, other site: Secondary | ICD-10-CM

## 2018-03-27 DIAGNOSIS — R51 Headache: Secondary | ICD-10-CM | POA: Diagnosis not present

## 2018-03-27 DIAGNOSIS — M4802 Spinal stenosis, cervical region: Secondary | ICD-10-CM | POA: Diagnosis not present

## 2018-03-27 DIAGNOSIS — M5441 Lumbago with sciatica, right side: Principal | ICD-10-CM

## 2018-03-27 DIAGNOSIS — Z7982 Long term (current) use of aspirin: Secondary | ICD-10-CM | POA: Diagnosis not present

## 2018-03-27 DIAGNOSIS — M5481 Occipital neuralgia: Secondary | ICD-10-CM | POA: Diagnosis not present

## 2018-03-27 DIAGNOSIS — M47813 Spondylosis without myelopathy or radiculopathy, cervicothoracic region: Secondary | ICD-10-CM | POA: Diagnosis not present

## 2018-03-27 DIAGNOSIS — Z5181 Encounter for therapeutic drug level monitoring: Secondary | ICD-10-CM | POA: Insufficient documentation

## 2018-03-27 DIAGNOSIS — Z79891 Long term (current) use of opiate analgesic: Secondary | ICD-10-CM

## 2018-03-27 DIAGNOSIS — M9981 Other biomechanical lesions of cervical region: Secondary | ICD-10-CM

## 2018-03-27 MED ORDER — TIZANIDINE HCL 4 MG PO TABS: 4.0000 mg | ORAL_TABLET | Freq: Three times a day (TID) | ORAL | 0 refills | Status: DC

## 2018-03-27 MED ORDER — HYDROCODONE-ACETAMINOPHEN 10-325 MG PO TABS: 1.0000 | ORAL_TABLET | Freq: Three times a day (TID) | ORAL | 0 refills | Status: DC | PRN

## 2018-03-27 NOTE — Patient Instructions (Addendum)
Patient given 1 Rx for Hydrocodone. Has a Rx to be picked up at the pharmacy. You have been given pre-procedure instructions.  ____________________________________________________________________________________________  Medication Rules  Applies to: All patients receiving prescriptions (written or electronic).  Pharmacy of record: Pharmacy where electronic prescriptions will be sent. If written prescriptions are taken to a different pharmacy, please inform the nursing staff. The pharmacy listed in the electronic medical record should be the one where you would like electronic prescriptions to be sent.  Prescription refills: Only during scheduled appointments. Applies to both, written and electronic prescriptions.  NOTE: The following applies primarily to controlled substances (Opioid* Pain Medications).   Patient's responsibilities: 1. Pain Pills: Bring all pain pills to every appointment (except for procedure appointments). 2. Pill Bottles: Bring pills in original pharmacy bottle. Always bring newest bottle. Bring bottle, even if empty. 3. Medication refills: You are responsible for knowing and keeping track of what medications you need refilled. The day before your appointment, write a list of all prescriptions that need to be refilled. Bring that list to your appointment and give it to the admitting nurse. Prescriptions will be written only during appointments. If you forget a medication, it will not be "Called in", "Faxed", or "electronically sent". You will need to get another appointment to get these prescribed. 4. Prescription Accuracy: You are responsible for carefully inspecting your prescriptions before leaving our office. Have the discharge nurse carefully go over each prescription with you, before taking them home. Make sure that your name is accurately spelled, that your address is correct. Check the name and dose of your medication to make sure it is accurate. Check the number of  pills, and the written instructions to make sure they are clear and accurate. Make sure that you are given enough medication to last until your next medication refill appointment. 5. Taking Medication: Take medication as prescribed. Never take more pills than instructed. Never take medication more frequently than prescribed. Taking less pills or less frequently is permitted and encouraged, when it comes to controlled substances (written prescriptions).  6. Inform other Doctors: Always inform, all of your healthcare providers, of all the medications you take. 7. Pain Medication from other Providers: You are not allowed to accept any additional pain medication from any other Doctor or Healthcare provider. There are two exceptions to this rule. (see below) In the event that you require additional pain medication, you are responsible for notifying us, as stated below. 8. Medication Agreement: You are responsible for carefully reading and following our Medication Agreement. This must be signed before receiving any prescriptions from our practice. Safely store a copy of your signed Agreement. Violations to the Agreement will result in no further prescriptions. (Additional copies of our Medication Agreement are available upon request.) 9. Laws, Rules, & Regulations: All patients are expected to follow all [address] and [employer], Rules, [place] [employer] [place] [employer]. Ignorance of the Laws does not constitute a valid excuse. The use of any illegal substances is prohibited. 10. Adopted CDC guidelines & recommendations: Target dosing levels will be at or below 60 MME/day. Use of benzodiazepines** is not recommended.  Exceptions: There are only two exceptions to the rule of not receiving pain medications from other Healthcare Providers. 1. Exception #1 (Emergencies): In the event of an emergency (i.e.: accident requiring emergency care), you are allowed to receive additional pain medication. However, you are responsible for:  As soon as you are able, call our office [phone], at any time of the day or  night, and leave a message stating your name, the date and nature of the emergency, and the name and dose of the medication prescribed. In the event that your call is answered by a member of our staff, make sure to document and save the date, time, and the name of the person that took your information.  2. Exception #2 (Planned Surgery): In the event that you are scheduled by another doctor or dentist to have any type of surgery or procedure, you are allowed (for a period no longer than 30 days), to receive additional pain medication, for the acute post-op pain. However, in this case, you are responsible for picking up a copy of our "Post-op Pain Management for Surgeons" handout, and giving it to your surgeon or dentist. This document is available at our office, and does not require an appointment to obtain it. Simply go to our office during business hours (Monday-Thursday from 8:00 AM to 4:00 PM) (Friday 8:00 AM to 12:00 Noon) or if you have a scheduled appointment with us, prior to your surgery, and ask for it by name. In addition, you will need to provide us with your name, name of your surgeon, type of surgery, and date of procedure or surgery.  *Opioid medications include: morphine, codeine, oxycodone, oxymorphone, hydrocodone, hydromorphone, meperidine, tramadol, tapentadol, buprenorphine, fentanyl, methadone. **Benzodiazepine medications include: diazepam (Valium), alprazolam (Xanax), clonazepam (Klonopine), lorazepam (Ativan), clorazepate (Tranxene), chlordiazepoxide (Librium), estazolam (Prosom), oxazepam (Serax), temazepam (Restoril), triazolam (Halcion) (Last updated: 02/14/2018) ____________________________________________________________________________________________    BMI Assessment: Estimated body mass index is 29.01 kg/m as calculated from the following:   Height as of this encounter: 5\' 4"  (1.626 m).    Weight as of this encounter: 169 lb (76.7 kg).  BMI interpretation table: BMI level Category Range association with higher incidence of chronic pain  <18 kg/m2 Underweight   18.5-24.9 kg/m2 Ideal body weight   25-29.9 kg/m2 Overweight Increased incidence by 20%  30-34.9 kg/m2 Obese (Class I) Increased incidence by 68%  35-39.9 kg/m2 Severe obesity (Class II) Increased incidence by 136%  >40 kg/m2 Extreme obesity (Class III) Increased incidence by 254%   BMI Readings from Last 4 Encounters:  03/27/18 29.01 kg/m  03/05/18 29.18 kg/m  02/25/18 29.18 kg/m  02/18/18 29.52 kg/m   Wt Readings from Last 4 Encounters:  03/27/18 169 lb (76.7 kg)  03/05/18 170 lb (77.1 kg)  02/25/18 170 lb (77.1 kg)  02/18/18 172 lb (78 kg)  Preparing for your procedure (without sedation) Instructions: . Oral Intake: Do not eat or drink anything for at least 3 hours prior to your procedure. . Transportation: Unless otherwise stated by your physician, you may drive yourself after the procedure. . Blood Pressure Medicine: Take your blood pressure medicine with a sip of water the morning of the procedure. . Insulin: Take only  of your normal insulin dose. . Preventing infections: Shower with an antibacterial soap the morning of your procedure. . Build-up your immune system: Take 1000 mg of Vitamin C with every meal (3 times a day) the day prior to your procedure. . Pregnancy: If you are pregnant, call and cancel the procedure. . Sickness: If you have a cold, fever, or any active infections, call and cancel the procedure. . Arrival: You must be in the facility at least 30 minutes prior to your scheduled procedure. . Children: Do not bring any children with you. . Dress appropriately: Bring dark clothing that you would not mind if they get stained. . Valuables: Do not bring any  jewelry or valuables. Procedure appointments are reserved for interventional treatments only. Marland Kitchen No Prescription Refills. . No  medication changes will be discussed during procedure appointments. . No disability issues will be discussed.

## 2018-03-27 NOTE — Progress Notes (Signed)
Nursing Pain Medication Assessment:  Safety precautions to be maintained throughout the outpatient stay will include: orient to surroundings, keep bed in low position, maintain call bell within reach at all times, provide assistance with transfer out of bed and ambulation.  Medication Inspection Compliance: Pill count conducted under aseptic conditions, in front of the patient. Neither the pills nor the bottle was removed from the patient's sight at any time. Once count was completed pills were immediately returned to the patient in their original bottle.  Medication: Hydrocodone/APAP Pill/Patch Count: 1 of 90 pills remain Pill/Patch Appearance: Markings consistent with prescribed medication Bottle Appearance: Standard pharmacy container. Clearly labeled. Filled Date:03 /11/ 2019 Last Medication intake:  Today

## 2018-03-27 NOTE — Progress Notes (Signed)
Patient's Name: Jean Gomez  MRN: [id number]  Referring Provider: Kirk Ruths, MD  DOB: February 01, 1956  PCP: Kirk Ruths, MD  DOS: 03/27/2018  Note by: Vevelyn Francois NP  Service setting: Ambulatory outpatient  Specialty: Interventional Pain Management  Location: [facility] (AMB) Pain Management Facility    Patient type: Established    Primary Reason(s) for Visit: Encounter for prescription drug management & post-procedure evaluation of chronic illness with mild to moderate exacerbation(Level of risk: moderate) CC: Headache (back of head)  HPI  Jean Gomez is a 62 y.o. year old, female patient, who comes today for a post-procedure evaluation and medication management. She has Chronic pain syndrome; Pharmacologic therapy; Disorder of skeletal system; Problems influencing health status; Long term current use of opiate analgesic; Chronic low back pain (Bilateral) with right-sided sciatica (Secondary Area of Pain) (R>L); Chronic lower extremity pain (Tertiary Area of Pain) (Bilateral) (R>L); Chronic knee pain (Fourth Area of Pain) (Bilateral) (R>L); Chronic shoulder pain (Fifth Area of Pain) (Bilateral) (R>L); Occipital neuralgia (Primary Area of Pain) (Bilateral) (R>L); Chronic neck pain (Bilateral) (R>L); Chronic sacroiliac joint pain (Right); Chronic migraine without aura; Chronic respiratory failure with hypoxia ([place]); Constipation; Epilepsy ([place]); HTN, goal below 140/90; Essential tremor; Generalized anxiety disorder; H/O aneurysm; GERD (gastroesophageal reflux disease); History of depression; History of seizure disorder; Hypercholesterolemia; Insomnia; Irritable bowel syndrome; Major depression in remission ([place]); Muscle spasm; Obstructive sleep apnea on CPAP; Osteoarthritis of knee (Bilateral) (R>L); COPD (chronic obstructive pulmonary disease) ([place]); Rotator cuff injury; Sarcoidosis; Spondylosis of lumbar region without myelopathy or radiculopathy; Vitamin D deficiency; Chronic shoulder pain  (Right); Nonruptured cerebral aneurysm; Chronic headache disorder (Primary Area of Pain); Pulmonary emphysema ([place]); Seizure disorder ([place]); Hiatal hernia; Occipital headache (Right); Frontal headache (Left); Chronic low back pain (Midline) (Secondary Area of Pain); Chronic lumbar radiculopathy (Right); Osteoarthritis involving multiple joints; Osteoarthritis of shoulder (Bilateral); Chronic musculoskeletal pain; Lumbar facet joint syndrome (Bilateral) (R>L); Chronic hip pain (Tertiary Area of Pain) (Bilateral) (R>L); DDD (degenerative disc disease), cervical; Cervical facet arthropathy (Bilateral); Cervical facet syndrome (Bilateral) (R>L); Cervical Grade 1 Anterolisthesis of C4 over C5 (Degenerative); Cervical Grade 1 Retrolisthesis of C5 over C6; Cervical foraminal stenosis (C3-4, C4-5, and C5-6) (Bilateral); DDD (degenerative disc disease), lumbar; Osteoarthritis of lumbar spine; Osteoarthritis of facet joint of lumbar spine; and Spondylosis without myelopathy or radiculopathy, cervicothoracic region on their problem list. Her primarily concern today is the Headache (back of head)  Pain Assessment: Location: Posterior Head Radiating: radiates from back of head to front of head Onset: More than a month ago Duration: Chronic pain Quality: Squeezing, Pressure, Sharp Severity: 4 /10 (self-reported pain score)  Note: Reported level is compatible with observation.                          Timing: Constant Modifying factors: resting  Jean Gomez was last seen on 02/07/2018 for a procedure. During today's appointment we reviewed Jean Gomez's post-procedure results, as well as her outpatient medication regimen. She admits that she continues to have headaches. She is doing a little better than she was. She does feel like she needs this repeated. She also admits that she feels like it is time for her knee injections. She admits that she has had gel injections in the past. She has had to change her PCP to    Further details on both, my assessment(s), as well as the proposed treatment plan, please see below.  Controlled Substance Pharmacotherapy Assessment REMS (Risk Evaluation and  Mitigation Strategy)  Analgesic: Hydrocodone/acetaminophen 10/325 3 times daily (fill date 11/19/2017) hydrocodone 30 mg per day. MME/day:68m/day  TDewayne Shorter RN  03/27/2018  9:11 AM  Signed Nursing Pain Medication Assessment:  Safety precautions to be maintained throughout the outpatient stay will include: orient to surroundings, keep bed in low position, maintain call bell within reach at all times, provide assistance with transfer out of bed and ambulation.  Medication Inspection Compliance: Pill count conducted under aseptic conditions, in front of the patient. Neither the pills nor the bottle was removed from the patient's sight at any time. Once count was completed pills were immediately returned to the patient in their original bottle.  Medication: Hydrocodone/APAP Pill/Patch Count: 1 of 90 pills remain Pill/Patch Appearance: Markings consistent with prescribed medication Bottle Appearance: Standard pharmacy container. Clearly labeled. Filled Date:03 /11/ 2019 Last Medication intake:  Today   Pharmacokinetics: Liberation and absorption (onset of action): WNL Distribution (time to peak effect): WNL Metabolism and excretion (duration of action): WNL         Pharmacodynamics: Desired effects: Analgesia: Ms. BDelanceyreports >50% benefit. Functional ability: Patient reports that medication allows her to accomplish basic ADLs Clinically meaningful improvement in function (CMIF): Sustained CMIF goals met Perceived effectiveness: Described as relatively effective, allowing for increase in activities of daily living (ADL) Undesirable effects: Side-effects or Adverse reactions: None reported Monitoring: [place] PMP: Online review of the past 160-montheriod conducted. Compliant with practice rules and  regulations Last UDS on record: Summary  Date Value Ref Range Status  02/07/2018 FINAL  Final    Comment:    ==================================================================== TOXASSURE COMP DRUG ANALYSIS,UR ==================================================================== Test                             Result       Flag       Units Drug Present and Declared for Prescription Verification   Levetiracetam                  PRESENT      EXPECTED   Topiramate                     PRESENT      EXPECTED   Tizanidine                     PRESENT      EXPECTED   Citalopram                     PRESENT      EXPECTED   Desmethylcitalopram            PRESENT      EXPECTED    Desmethylcitalopram is an expected metabolite of citalopram or    the enantiomeric form, escitalopram.   Mirtazapine                    PRESENT      EXPECTED   Trazodone                      PRESENT      EXPECTED   1,3 chlorophenyl piperazine    PRESENT      EXPECTED    1,3-chlorophenyl piperazine is an expected metabolite of    trazodone.   Acetaminophen                  PRESENT  EXPECTED Drug Present not Declared for Prescription Verification   Fluoxetine                     PRESENT      UNEXPECTED   Norfluoxetine                  PRESENT      UNEXPECTED    Norfluoxetine is an expected metabolite of fluoxetine.   Venlafaxine                    PRESENT      UNEXPECTED   Desmethylvenlafaxine           PRESENT      UNEXPECTED    Desmethylvenlafaxine is an expected metabolite of venlafaxine.   Diphenhydramine                PRESENT      UNEXPECTED Drug Absent but Declared for Prescription Verification   Hydrocodone                    Not Detected UNEXPECTED ng/mg creat   Diclofenac                     Not Detected UNEXPECTED    Diclofenac, as indicated in the declared medication list, is not    always detected even when used as directed.   Salicylate                     Not Detected UNEXPECTED    Aspirin, as  indicated in the declared medication list, is not    always detected even when used as directed.   Hydroxyzine                    Not Detected UNEXPECTED ==================================================================== Test                      Result    Flag   Units      Ref Range   Creatinine              77               mg/dL      >=20 ==================================================================== Declared Medications:  The flagging and interpretation on this report are based on the  following declared medications.  Unexpected results may arise from  inaccuracies in the declared medications.  **Note: The testing scope of this panel includes these medications:  Escitalopram (Lexapro)  Hydrocodone (Norco)  Hydroxyzine (Atarax)  Levetiracetam (Keppra)  Mirtazapine (Remeron)  Topiramate (Topamax)  Trazodone (Desyrel)  **Note: The testing scope of this panel does not include small to  moderate amounts of these reported medications:  Acetaminophen (Norco)  Aspirin  Diclofenac (Voltaren)  Tizanidine (Zanaflex)  **Note: The testing scope of this panel does not include following  reported medications:  Albuterol  Albuterol (Proventil)  Aluminum (Gaviscon)  Amlodipine (Norvasc)  Atorvastatin (Lipitor)  Betamethasone (Diprolene)  Budenoside (Symbicort)  Calcium carbonate  Clotrimazole (Lotrisone)  Formoterol (Symbicort)  Hydrochlorothiazide (Hydrodiuril)  Ipratropium (Atrovent)  Ketoconazole (Nizoral)  Losartan (Cozaar)  Lubiprostone (Amitiza)  Magnesium (Gaviscon)  Nitroglycerin (Nitrostat)  Pantoprazole (Protonix)  Potassium (K-Dur)  Sucralfate (Carafate)  Sumatriptan (Imitrex) ==================================================================== For clinical consultation, please call [phone]. ====================================================================    UDS interpretation: Compliant          Medication Assessment Form: Reviewed. Patient  indicates being compliant with therapy Treatment  compliance: Compliant Risk Assessment Profile: Aberrant behavior: See prior evaluations. None observed or detected today Comorbid factors increasing risk of overdose: See prior notes. No additional risks detected today Risk of substance use disorder (SUD): Low Opioid Risk Tool - 02/07/18 1144      Family History of Substance Abuse   Alcohol  Negative    Illegal Drugs  Negative    Rx Drugs  Negative      Personal History of Substance Abuse   Alcohol  Negative    Illegal Drugs  Negative    Rx Drugs  Negative      Age   Age between 61-45 years   No      History of Preadolescent Sexual Abuse   History of Preadolescent Sexual Abuse  Negative or Female      Psychological Disease   Psychological Disease  Negative    Depression  Positive      Total Score   Opioid Risk Tool Scoring  1    Opioid Risk Interpretation  Low Risk      ORT Scoring interpretation table:  Score <3 = Low Risk for SUD  Score between 4-7 = Moderate Risk for SUD  Score >8 = High Risk for Opioid Abuse   Risk Mitigation Strategies:  Patient Counseling: Covered Patient-Prescriber Agreement (PPA): Present and active  Notification to other healthcare providers: Done  Pharmacologic Plan: No change in therapy, at this time.             Post-Procedure Assessment  03/05/2018 Procedure: Diagnostic, Greater, Occipital Nerve Block  Pre-procedure pain score:  5/10 Post-procedure pain score: 2/10         Influential Factors: BMI: 29.01 kg/m Intra-procedural challenges: None observed.         Assessment challenges: None detected.              Reported side-effects: None.        Post-procedural adverse reactions or complications: None reported         Sedation: Please see nurses note. When no sedatives are used, the analgesic levels obtained are directly associated to the effectiveness of the local anesthetics. However, when sedation is provided, the level of analgesia  obtained during the initial 1 hour following the intervention, is believed to be the result of a combination of factors. These factors may include, but are not limited to: 1. The effectiveness of the local anesthetics used. 2. The effects of the analgesic(s) and/or anxiolytic(s) used. 3. The degree of discomfort experienced by the patient at the time of the procedure. 4. The patients ability and reliability in recalling and recording the events. 5. The presence and influence of possible secondary gains and/or psychosocial factors. Reported result: Relief experienced during the 1st hour after the procedure: 100 % (Ultra-Short Term Relief)            Interpretative annotation: Clinically appropriate result. Analgesia during this period is likely to be Local Anesthetic and/or IV Sedative (Analgesic/Anxiolytic) related.          Effects of local anesthetic: The analgesic effects attained during this period are directly associated to the localized infiltration of local anesthetics and therefore cary significant diagnostic value as to the etiological location, or anatomical origin, of the pain. Expected duration of relief is directly dependent on the pharmacodynamics of the local anesthetic used. Long-acting (4-6 hours) anesthetics used.  Reported result: Relief during the next 4 to 6 hour after the procedure: 0 % (Short-Term Relief)  Interpretative annotation: Clinically appropriate result. Analgesia during this period is likely to be Local Anesthetic-related.          Long-term benefit: Defined as the period of time past the expected duration of local anesthetics (1 hour for short-acting and 4-6 hours for long-acting). With the possible exception of prolonged sympathetic blockade from the local anesthetics, benefits during this period are typically attributed to, or associated with, other factors such as analgesic sensory neuropraxia, antiinflammatory effects, or beneficial biochemical changes  provided by agents other than the local anesthetics.  Reported result: Extended relief following procedure: 25 % (Long-Term Relief)            Interpretative annotation: Clinically appropriate result. Good relief. No permanent benefit expected. Inflammation plays a part in the etiology to the pain.          Current benefits: Defined as reported results that persistent at this point in time.   Analgesia: 25-50 %            Function: Somewhat improved ROM: Somewhat improved Interpretative annotation: Recurrence of symptoms. No permanent benefit expected. Effective diagnostic intervention.          Interpretation: Results would suggest a successful diagnostic intervention.                  Plan:  Please see "Plan of Care" for details.                Laboratory Chemistry  Inflammation Markers (CRP: Acute Phase) (ESR: Chronic Phase) Lab Results  Component Value Date   CRP <0.8 02/07/2018   ESRSEDRATE 53 (H) 02/07/2018                         Rheumatology Markers No results found for: Elayne Guerin, [facility]                      Renal Function Markers Lab Results  Component Value Date   BUN 15 02/07/2018   CREATININE 0.61 02/07/2018   GFRAA >60 02/07/2018   GFRNONAA >60 02/07/2018                              Hepatic Function Markers Lab Results  Component Value Date   AST 19 02/07/2018   ALT 18 02/07/2018   ALBUMIN 4.5 02/18/2018   ALBUMIN 4.2 02/18/2018   ALKPHOS 63 02/07/2018                        Electrolytes Lab Results  Component Value Date   NA 139 02/07/2018   K 3.6 02/07/2018   CL 103 02/07/2018   CALCIUM 9.6 02/07/2018   MG 2.2 02/07/2018                        Neuropathy Markers Lab Results  Component Value Date   VITAMINB12 268 02/07/2018                        Bone Pathology Markers Lab Results  Component Value Date   25OHVITD1 14 (L) 02/07/2018   25OHVITD2 <1.0 02/07/2018   25OHVITD3 14 02/07/2018                          Coagulation Parameters No results found for: INR, LABPROT, APTT,  PLT, DDIMER                      Cardiovascular Markers No results found for: BNP, CKTOTAL, CKMB, TROPONINI, HGB, HCT                       CA Markers No results found for: CEA, CA125, LABCA2                      Note: Lab results reviewed.  Recent Diagnostic Imaging Results  DG C-Arm 1-60 Min-No Report Fluoroscopy was utilized by the requesting physician.  No radiographic  interpretation.   Complexity Note: Imaging results reviewed. Results shared with Jean Gomez, using Layman's terms.                         Meds   Current Outpatient Medications:  .  albuterol (PROVENTIL) (2.5 MG/3ML) 0.083% nebulizer solution, Take 2.5 mg by nebulization every 4 (four) hours as needed for wheezing or shortness of breath., Disp: , Rfl:  .  Albuterol Sulfate 108 (90 Base) MCG/ACT AEPB, Inhale 2 puffs into the lungs every 6 (six) hours as needed., Disp: , Rfl:  .  amLODipine (NORVASC) 10 MG tablet, Take 10 mg by mouth daily., Disp: , Rfl:  .  aspirin EC 81 MG tablet, Take 81 mg by mouth daily., Disp: , Rfl:  .  atorvastatin (LIPITOR) 10 MG tablet, Take 10 mg by mouth daily., Disp: , Rfl:  .  betamethasone, augmented, (DIPROLENE) 0.05 % lotion, Apply topically 2 (two) times daily., Disp: , Rfl:  .  budesonide-formoterol (SYMBICORT) 160-4.5 MCG/ACT inhaler, Inhale 2 puffs into the lungs 2 (two) times daily., Disp: , Rfl:  .  clotrimazole-betamethasone (LOTRISONE) cream, Apply 1 application topically 2 (two) times daily., Disp: , Rfl:  .  diclofenac sodium (VOLTAREN) 1 % GEL, Apply 2 g topically 4 (four) times daily., Disp: , Rfl:  .  ergocalciferol (VITAMIN D2) 50000 units capsule, Take 1 capsule (50,000 Units total) by mouth 2 (two) times a week. X 6 weeks. Take with 600 mg of OTC Calcium, Disp: 12 capsule, Rfl: 0 .  escitalopram (LEXAPRO) 20 MG tablet, Take 20 mg by mouth daily., Disp: , Rfl:  .  hydrochlorothiazide  (HYDRODIURIL) 25 MG tablet, Take 25 mg by mouth daily., Disp: , Rfl:  .  HYDROcodone-acetaminophen (NORCO) 10-325 MG tablet, Take 1 tablet by mouth every 8 (eight) hours as needed for severe pain., Disp: 90 tablet, Rfl: 0 .  ipratropium (ATROVENT) 0.02 % nebulizer solution, Take 0.5 mg by nebulization every 4 (four) hours as needed for wheezing or shortness of breath., Disp: , Rfl:  .  ketoconazole (NIZORAL) 2 % shampoo, Apply 1 application topically 2 (two) times a week., Disp: , Rfl:  .  lactulose (CHRONULAC) 10 GM/15ML solution, TK 15 ML PO BID PRN, Disp: , Rfl: 5 .  levETIRAcetam (KEPPRA) 750 MG tablet, Take 750 mg by mouth daily., Disp: , Rfl:  .  losartan (COZAAR) 25 MG tablet, Take 25 mg by mouth daily., Disp: , Rfl:  .  mirtazapine (REMERON) 15 MG tablet, Take 15 mg by mouth at bedtime., Disp: , Rfl:  .  nitroGLYCERIN (NITROSTAT) 0.4 MG SL tablet, Place 0.4 mg under the tongue every 5 (five) minutes as needed for chest pain., Disp: , Rfl:  .  ondansetron (ZOFRAN) 4 MG tablet, , Disp: , Rfl: 1 .  pantoprazole (PROTONIX)  40 MG tablet, Take 40 mg by mouth daily., Disp: , Rfl:  .  potassium chloride SA (K-DUR,KLOR-CON) 20 MEQ tablet, Take 20 mEq by mouth 2 (two) times daily., Disp: , Rfl:  .  sucralfate (CARAFATE) 1 g tablet, Take 1 g by mouth 4 (four) times daily., Disp: , Rfl:  .  tiZANidine (ZANAFLEX) 4 MG tablet, Take 1 tablet (4 mg total) by mouth 3 (three) times daily., Disp: 90 tablet, Rfl: 0 .  torsemide (DEMADEX) 5 MG tablet, , Disp: , Rfl: 11 .  venlafaxine (EFFEXOR) 75 MG tablet, Take 75 mg by mouth daily., Disp: , Rfl:   ROS  Constitutional: Denies any fever or chills Gastrointestinal: No reported hemesis, hematochezia, vomiting, or acute GI distress Musculoskeletal: Denies any acute onset joint swelling, redness, loss of ROM, or weakness Neurological: No reported episodes of acute onset apraxia, aphasia, dysarthria, agnosia, amnesia, paralysis, loss of coordination, or loss of  consciousness  Allergies  Jean Gomez is allergic to gabapentin; labetalol; sulfabenzamide; fentanyl; and penicillins.  [place]  Drug: Jean Gomez  reports that she does not use drugs. Alcohol:  reports that she drinks alcohol. Tobacco:  reports that she has never smoked. She has never used smokeless tobacco. Medical:  has a past medical history of Asthma, Cerebral aneurysm, Chest pain (11/01/2016), Chronic migraine, Chronic respiratory failure ([place]), COPD (chronic obstructive pulmonary disease) ([place]), Hypertension, Osteoarthritis, Sarcoid, and Seizures ([place]). Surgical: Jean Gomez  has a past surgical history that includes Cholecystectomy; Abdominal hysterectomy; and Spinal fusion. Family: family history is not on file.  Constitutional Exam  General appearance: Well nourished, well developed, and well hydrated. In no apparent acute distress Vitals:   03/27/18 0857  BP: (!) 142/79  Pulse: (!) 109  Resp: 16  Temp: 98 F (36.7 C)  SpO2: 98%  Weight: 169 lb (76.7 kg)  Height: 5' 4" (1.626 m)  Psych/Mental status: Alert, oriented x 3 (person, place, & time)       Eyes: PERLA Respiratory: No evidence of acute respiratory distress  Cervical Spine Area Exam  Skin & Axial Inspection: No masses, redness, edema, swelling, or associated skin lesions Alignment: Symmetrical Functional ROM: Unrestricted ROM      Stability: No instability detected Muscle Tone/Strength: Functionally intact. No obvious neuro-muscular anomalies detected. Sensory (Neurological): Unimpaired Palpation: No palpable anomalies              Upper Extremity (UE) Exam    Side: Right upper extremity  Side: Left upper extremity  Skin & Extremity Inspection: Skin color, temperature, and hair growth are WNL. No peripheral edema or cyanosis. No masses, redness, swelling, asymmetry, or associated skin lesions. No contractures.  Skin & Extremity Inspection: Skin color, temperature, and hair growth are WNL. No peripheral edema or  cyanosis. No masses, redness, swelling, asymmetry, or associated skin lesions. No contractures.  Functional ROM: Unrestricted ROM          Functional ROM: Unrestricted ROM          Muscle Tone/Strength: Functionally intact. No obvious neuro-muscular anomalies detected.  Muscle Tone/Strength: Functionally intact. No obvious neuro-muscular anomalies detected.  Sensory (Neurological): Unimpaired          Sensory (Neurological): Unimpaired          Palpation: No palpable anomalies              Palpation: No palpable anomalies              Specialized Test(s): Deferred  Specialized Test(s): Deferred          Thoracic Spine Area Exam  Skin & Axial Inspection: No masses, redness, or swelling Alignment: Symmetrical Functional ROM: Unrestricted ROM Stability: No instability detected Muscle Tone/Strength: Functionally intact. No obvious neuro-muscular anomalies detected. Sensory (Neurological): Unimpaired Muscle strength & Tone: No palpable anomalies  Lumbar Spine Area Exam  Skin & Axial Inspection: No masses, redness, or swelling Alignment: Symmetrical Functional ROM: Unrestricted ROM      Stability: No instability detected Muscle Tone/Strength: Functionally intact. No obvious neuro-muscular anomalies detected. Sensory (Neurological): Unimpaired Palpation: No palpable anomalies       Provocative Tests: Lumbar Hyperextension and rotation test: evaluation deferred today       Lumbar Lateral bending test: evaluation deferred today       Patrick's Maneuver: evaluation deferred today                    Gait & Posture Assessment  Ambulation: Patient ambulates using a cane Gait: Relatively normal for age and body habitus Posture: WNL   Lower Extremity Exam    Side: Right lower extremity  Side: Left lower extremity  Skin & Extremity Inspection: Skin color, temperature, and hair growth are WNL. No peripheral edema or cyanosis. No masses, redness, swelling, asymmetry, or associated skin  lesions. No contractures.  Skin & Extremity Inspection: Skin color, temperature, and hair growth are WNL. No peripheral edema or cyanosis. No masses, redness, swelling, asymmetry, or associated skin lesions. No contractures.  Functional ROM: Decreased ROM for knee joint  Functional ROM: Decreased ROM for knee joint  Muscle Tone/Strength: Functionally intact. No obvious neuro-muscular anomalies detected.  Muscle Tone/Strength: Functionally intact. No obvious neuro-muscular anomalies detected.  Sensory (Neurological): Unimpaired  Sensory (Neurological): Unimpaired  Palpation: No palpable anomalies  Palpation: No palpable anomalies   Assessment  Primary Diagnosis & Pertinent Problem List: The primary encounter diagnosis was Occipital neuralgia (Primary Area of Pain) (Bilateral) (R>L). Diagnoses of Chronic headache disorder (Primary Area of Pain), Osteoarthritis of knee (Bilateral) (R>L), Spondylosis without myelopathy or radiculopathy, cervicothoracic region, Chronic musculoskeletal pain, Chronic low back pain (Midline) (Secondary Area of Pain), Chronic pain syndrome, Long term prescription opiate use, DDD (degenerative disc disease), cervical, and Cervical foraminal stenosis (C3-4, C4-5, and C5-6) (Bilateral) were also pertinent to this visit.  Status Diagnosis  Persistent Persistent Persistent 1. Occipital neuralgia (Primary Area of Pain) (Bilateral) (R>L)   2. Chronic headache disorder (Primary Area of Pain)   3. Osteoarthritis of knee (Bilateral) (R>L)   4. Spondylosis without myelopathy or radiculopathy, cervicothoracic region   5. Chronic musculoskeletal pain   6. Chronic low back pain (Midline) (Secondary Area of Pain)   7. Chronic pain syndrome   8. Long term prescription opiate use   9. DDD (degenerative disc disease), cervical   10. Cervical foraminal stenosis (C3-4, C4-5, and C5-6) (Bilateral)     Problems updated and reviewed during this visit: Problem  Chronic Pain Syndrome    Pain clinic is seeing  Last Assessment & Plan:  See pain clinic notes. She expressed frustration that I have not prescribed her pain meds   Chronic Respiratory Failure With Hypoxia (Hcc)   Overview:  O2 [place]  Last Assessment & Plan:  On O2  O2 [place]  Last Assessment & Plan:  On O2, seeing pulm   Htn, Goal Below 140/90   Last Assessment & Plan:  Bp up and down, swelling is noted   Major Depression in Remission (Hcc)   Overview:  lexapro  lexapro  Last Assessment & Plan:  Mood is up and down, on lexapro   Seizure Disorder (Hcc)   Overview:  topamax  Last Assessment & Plan:  Controlled on topamax  topamax  Last Assessment & Plan:  Eeg is scheduled    Plan of Care  Pharmacotherapy (Medications Ordered): Meds ordered this encounter  Medications  . tiZANidine (ZANAFLEX) 4 MG tablet    Sig: Take 1 tablet (4 mg total) by mouth 3 (three) times daily.    Dispense:  90 tablet    Refill:  0    Do not place medication on "Automatic Refill". Fill one day early if pharmacy is closed on scheduled refill date.    Order Specific Question:   Supervising Provider    Answer:   Milinda Pointer [phone]  . HYDROcodone-acetaminophen (NORCO) 10-325 MG tablet    Sig: Take 1 tablet by mouth every 8 (eight) hours as needed for severe pain.    Dispense:  90 tablet    Refill:  0    Fill one day early if pharmacy is closed on scheduled refill date. Do not fill until: 03/27/2018 To last until: 04/26/2018    Order Specific Question:   Supervising Provider    Answer:   Milinda Pointer [phone]   New Prescriptions   No medications on file   Medications administered today: Angie Fava had no medications administered during this visit. Lab-work, procedure(s), and/or referral(s): Orders Placed This Encounter  Procedures  . KNEE INJECTION  . ToxASSURE Select 13 (MW), Urine   Imaging and/or referral(s): None  Interventional management options: Planned, scheduled, and/or  pending:    series of 5 bilateral intra-articular Hyalgan knee injections  Diagnostic bilateral greater occipital nerve block    Considering:   Diagnostic bilateral greater occipital nerve block  Possible bilateral occipital nerve RFA  Possible bilateral occipital nerve peripheral nerves stimulator trial  Diagnostic bilateral cervical facet block  Possible bilateral cervical facet RFA  Diagnostic right-sided cervical epidural steroid injection  Diagnostic bilateral intra-articular shoulder joint injection  Diagnostic bilateral suprascapular nerve block  Possible bilateral suprascapular nerve RFA  Diagnostic bilateral lumbar facet block  Possible bilateral lumbar facet RFA  Diagnostic right-sided lumbar epidural steroid injection  Diagnostic bilateral transforaminal epidural steroid injection  Diagnostic bilateral intra-articular hip joint injection  Diagnostic bilateral femoral nerve + obturator nerve block  Possible bilateral femoral nerve + obturator nerve RFA  Diagnostic bilateral intra-articular knee joint injection with local anesthetic and steroid  Possible series of 5 bilateral intra-articular Hyalgan knee injections  Diagnostic bilateral genicular nerve block  Possible bilateral genicular nerve RFA    PRN Procedures:   None at this time      Provider-requested follow-up: Return in about 1 month (around 04/24/2018) for MedMgmt with Me Dionisio David), in addition, Procedure(w/Sedation), w/ Dr. Dossie Arbour.  Future Appointments  Date Time Provider [place]  04/04/2018  8:00 AM Milinda Pointer, MD [facility] None  04/24/2018  8:30 AM Vevelyn Francois, NP [facility] None   Primary Care Physician: Kirk Ruths, MD Location: [facility] Note by: Vevelyn Francois NP Date: 03/27/2018; Time: 3:28 PM  Pain Score Disclaimer: We use the NRS-11 scale. This is a self-reported, subjective measurement of pain severity with only modest accuracy.  It is used primarily to identify changes within a particular patient. It must be understood that outpatient pain scales are significantly less accurate that those used for research, where they can be applied under ideal controlled  circumstances with minimal exposure to variables. In reality, the score is likely to be a combination of pain intensity and pain affect, where pain affect describes the degree of emotional arousal or changes in action readiness caused by the sensory experience of pain. Factors such as social and work situation, setting, emotional state, anxiety levels, expectation, and prior pain experience may influence pain perception and show large inter-individual differences that may also be affected by time variables.  Patient instructions provided during this appointment: Patient Instructions   Patient given 1 Rx for Hydrocodone. Has a Rx to be picked up at the pharmacy. You have been given pre-procedure instructions.  ____________________________________________________________________________________________  Medication Rules  Applies to: All patients receiving prescriptions (written or electronic).  Pharmacy of record: Pharmacy where electronic prescriptions will be sent. If written prescriptions are taken to a different pharmacy, please inform the nursing staff. The pharmacy listed in the electronic medical record should be the one where you would like electronic prescriptions to be sent.  Prescription refills: Only during scheduled appointments. Applies to both, written and electronic prescriptions.  NOTE: The following applies primarily to controlled substances (Opioid* Pain Medications).   Patient's responsibilities: 1. Pain Pills: Bring all pain pills to every appointment (except for procedure appointments). 2. Pill Bottles: Bring pills in original pharmacy bottle. Always bring newest bottle. Bring bottle, even if empty. 3. Medication refills: You are responsible for  knowing and keeping track of what medications you need refilled. The day before your appointment, write a list of all prescriptions that need to be refilled. Bring that list to your appointment and give it to the admitting nurse. Prescriptions will be written only during appointments. If you forget a medication, it will not be "Called in", "Faxed", or "electronically sent". You will need to get another appointment to get these prescribed. 4. Prescription Accuracy: You are responsible for carefully inspecting your prescriptions before leaving our office. Have the discharge nurse carefully go over each prescription with you, before taking them home. Make sure that your name is accurately spelled, that your address is correct. Check the name and dose of your medication to make sure it is accurate. Check the number of pills, and the written instructions to make sure they are clear and accurate. Make sure that you are given enough medication to last until your next medication refill appointment. 5. Taking Medication: Take medication as prescribed. Never take more pills than instructed. Never take medication more frequently than prescribed. Taking less pills or less frequently is permitted and encouraged, when it comes to controlled substances (written prescriptions).  6. Inform other Doctors: Always inform, all of your healthcare providers, of all the medications you take. 7. Pain Medication from other Providers: You are not allowed to accept any additional pain medication from any other Doctor or Healthcare provider. There are two exceptions to this rule. (see below) In the event that you require additional pain medication, you are responsible for notifying us, as stated below. 8. Medication Agreement: You are responsible for carefully reading and following our Medication Agreement. This must be signed before receiving any prescriptions from our practice. Safely store a copy of your signed Agreement. Violations to  the Agreement will result in no further prescriptions. (Additional copies of our Medication Agreement are available upon request.) 9. Laws, Rules, & Regulations: All patients are expected to follow all Federal and [employer], [employer], Rules, [employer]. Ignorance of the Laws does not constitute a valid excuse. The use of any illegal substances is  prohibited. 10. Adopted CDC guidelines & recommendations: Target dosing levels will be at or below 60 MME/day. Use of benzodiazepines** is not recommended.  Exceptions: There are only two exceptions to the rule of not receiving pain medications from other Healthcare Providers. 1. Exception #1 (Emergencies): In the event of an emergency (i.e.: accident requiring emergency care), you are allowed to receive additional pain medication. However, you are responsible for: As soon as you are able, call our office [phone], at any time of the day or night, and leave a message stating your name, the date and nature of the emergency, and the name and dose of the medication prescribed. In the event that your call is answered by a member of our staff, make sure to document and save the date, time, and the name of the person that took your information.  2. Exception #2 (Planned Surgery): In the event that you are scheduled by another doctor or dentist to have any type of surgery or procedure, you are allowed (for a period no longer than 30 days), to receive additional pain medication, for the acute post-op pain. However, in this case, you are responsible for picking up a copy of our "Post-op Pain Management for Surgeons" handout, and giving it to your surgeon or dentist. This document is available at our office, and does not require an appointment to obtain it. Simply go to our office during business hours (Monday-Thursday from 8:00 AM to 4:00 PM) (Friday 8:00 AM to 12:00 Noon) or if you have a scheduled appointment with [place], prior to your surgery, and ask for it by name.  In addition, you will need to provide [place] with your name, name of your surgeon, type of surgery, and date of procedure or surgery.  *Opioid medications include: morphine, codeine, oxycodone, oxymorphone, hydrocodone, hydromorphone, meperidine, tramadol, tapentadol, buprenorphine, fentanyl, methadone. **Benzodiazepine medications include: diazepam (Valium), alprazolam (Xanax), clonazepam (Klonopine), lorazepam (Ativan), clorazepate (Tranxene), chlordiazepoxide (Librium), estazolam (Prosom), oxazepam (Serax), temazepam (Restoril), triazolam (Halcion) (Last updated: 02/14/2018) ____________________________________________________________________________________________    BMI Assessment: Estimated body mass index is 29.01 kg/m as calculated from the following:   Height as of this encounter: 5' 4" (1.626 m).   Weight as of this encounter: 169 lb (76.7 kg).  BMI interpretation table: BMI level Category Range association with higher incidence of chronic pain  <18 kg/m2 Underweight   18.5-24.9 kg/m2 Ideal body weight   25-29.9 kg/m2 Overweight Increased incidence by 20%  30-34.9 kg/m2 Obese (Class I) Increased incidence by 68%  35-39.9 kg/m2 Severe obesity (Class II) Increased incidence by 136%  >40 kg/m2 Extreme obesity (Class III) Increased incidence by 254%   BMI Readings from Last 4 Encounters:  03/27/18 29.01 kg/m  03/05/18 29.18 kg/m  02/25/18 29.18 kg/m  02/18/18 29.52 kg/m   Wt Readings from Last 4 Encounters:  03/27/18 169 lb (76.7 kg)  03/05/18 170 lb (77.1 kg)  02/25/18 170 lb (77.1 kg)  02/18/18 172 lb (78 kg)  Preparing for your procedure (without sedation) Instructions: . Oral Intake: Do not eat or drink anything for at least 3 hours prior to your procedure. . Transportation: Unless otherwise stated by your physician, you may drive yourself after the procedure. . Blood Pressure Medicine: Take your blood pressure medicine with a sip of water the morning of the  procedure. . Insulin: Take only  of your normal insulin dose. . Preventing infections: Shower with an antibacterial soap the morning of your procedure. . Build-up your immune system: Take 1000 mg of Vitamin  C with every meal (3 times a day) the day prior to your procedure. . Pregnancy: If you are pregnant, call and cancel the procedure. . Sickness: If you have a cold, fever, or any active infections, call and cancel the procedure. . Arrival: You must be in the facility at least 30 minutes prior to your scheduled procedure. . Children: Do not bring any children with you. . Dress appropriately: Bring dark clothing that you would not mind if they get stained. . Valuables: Do not bring any jewelry or valuables. Procedure appointments are reserved for interventional treatments only. Marland Kitchen No Prescription Refills. . No medication changes will be discussed during procedure appointments. . No disability issues will be discussed.

## 2018-04-02 LAB — TOXASSURE SELECT 13 (MW), URINE

## 2018-04-04 ENCOUNTER — Other Ambulatory Visit: Payer: Self-pay

## 2018-04-04 ENCOUNTER — Encounter: Payer: Self-pay | Admitting: Pain Medicine

## 2018-04-04 ENCOUNTER — Ambulatory Visit: Payer: Medicare Other | Attending: Pain Medicine | Admitting: Pain Medicine

## 2018-04-04 VITALS — BP 141/91 | HR 94 | Temp 98.4°F | Resp 16 | Ht 65.0 in | Wt 175.0 lb

## 2018-04-04 DIAGNOSIS — M25562 Pain in left knee: Secondary | ICD-10-CM

## 2018-04-04 DIAGNOSIS — M170 Bilateral primary osteoarthritis of knee: Secondary | ICD-10-CM

## 2018-04-04 DIAGNOSIS — G8929 Other chronic pain: Secondary | ICD-10-CM | POA: Diagnosis present

## 2018-04-04 DIAGNOSIS — M25561 Pain in right knee: Secondary | ICD-10-CM

## 2018-04-04 MED ORDER — SODIUM HYALURONATE (VISCOSUP) 20 MG/2ML IX SOSY
2.0000 mL | PREFILLED_SYRINGE | Freq: Once | INTRA_ARTICULAR | Status: AC
  Administered 2018-04-04: 2 mL via INTRA_ARTICULAR

## 2018-04-04 MED ORDER — LIDOCAINE HCL (PF) 1 % IJ SOLN
4.0000 mL | Freq: Once | INTRAMUSCULAR | Status: AC
  Administered 2018-04-04: 5 mL
  Filled 2018-04-04: qty 5

## 2018-04-04 MED ORDER — ROPIVACAINE HCL 2 MG/ML IJ SOLN
4.0000 mL | Freq: Once | INTRAMUSCULAR | Status: AC
  Administered 2018-04-04: 10 mL via INTRA_ARTICULAR
  Filled 2018-04-04: qty 10

## 2018-04-04 NOTE — Progress Notes (Addendum)
Patient's Name: Jean Gomez  MRN: [id number]  Referring Provider: Lauro Regulus, MD  DOB: Aug 11, 1956  PCP: Lauro Regulus, MD  DOS: 04/04/2018  Note by: Oswaldo Done, MD  Service setting: Ambulatory outpatient  Specialty: Interventional Pain Management  Patient type: Established  Location: [facility] (AMB) Pain Management Facility  Visit type: Interventional Procedure   Primary Reason for Visit: Interventional Pain Management Treatment. CC: Knee Pain (bilateral)  Procedure:  Anesthesia, Analgesia, Anxiolysis:  Type: Therapeutic Intra-Articular Hyalgan Knee Injection #1  Region: Lateral infrapatellar Knee Region Level: Knee Joint Laterality: Bilateral  Type: Local Anesthesia Indication(s): Analgesia         Local Anesthetic: Lidocaine 1-2% Route: Infiltration ([place]/IM) IV Access: Declined Sedation: Declined    Indications: 1. Osteoarthritis of knee (Bilateral) (R>L)   2. Chronic knee pain (Fourth Area of Pain) (Bilateral) (R>L)    Pain Score: Pre-procedure: 10-Worst pain ever/10 Post-procedure: 5 /10  Pre-op Assessment:  Jean Gomez is a 62 y.o. (year old), female patient, seen today for interventional treatment. She  has a past surgical history that includes Cholecystectomy; Abdominal hysterectomy; and Spinal fusion. Jean Gomez has a current medication list which includes the following prescription(s): albuterol, albuterol sulfate, amlodipine, aspirin ec, atorvastatin, betamethasone (augmented), budesonide-formoterol, clotrimazole-betamethasone, diclofenac sodium, ergocalciferol, escitalopram, hydrochlorothiazide, hydrocodone-acetaminophen, ipratropium, ketoconazole, lactulose, levetiracetam, losartan, mirtazapine, nitroglycerin, ondansetron, pantoprazole, potassium chloride sa, sucralfate, tizanidine, torsemide, and venlafaxine. Her primarily concern today is the Knee Pain (bilateral)  Initial Vital Signs:  Pulse/HCG Rate: 82  Temp: 98.4 F (36.9 C) Resp: 14(oxygen at 2L  per bnc) BP: 140/84 SpO2: 100 %  BMI: Estimated body mass index is 29.12 kg/m as calculated from the following:   Height as of this encounter: 5\' 5"  (1.651 m).   Weight as of this encounter: 175 lb (79.4 kg).  Risk Assessment: Allergies: Reviewed. She is allergic to gabapentin; labetalol; sulfabenzamide; fentanyl; and penicillins.  Allergy Precautions: No Fentanyl used. Coagulopathies: Reviewed. None identified.  Blood-thinner therapy: None at this time Active Infection(s): Reviewed. None identified. Jean Gomez is afebrile  Site Confirmation: Jean Gomez was asked to confirm the procedure and laterality before marking the site Procedure checklist: Completed Consent: Before the procedure and under the influence of no sedative(s), amnesic(s), or anxiolytics, the patient was informed of the treatment options, risks and possible complications. To fulfill our ethical and legal obligations, as recommended by the American Medical Association's Code of Ethics, I have informed the patient of my clinical impression; the nature and purpose of the treatment or procedure; the risks, benefits, and possible complications of the intervention; the alternatives, including doing nothing; the risk(s) and benefit(s) of the alternative treatment(s) or procedure(s); and the risk(s) and benefit(s) of doing nothing. The patient was provided information about the general risks and possible complications associated with the procedure. These may include, but are not limited to: failure to achieve desired goals, infection, bleeding, organ or nerve damage, allergic reactions, paralysis, and death. In addition, the patient was informed of those risks and complications associated to the procedure, such as failure to decrease pain; infection; bleeding; organ or nerve damage with subsequent damage to sensory, motor, and/or autonomic systems, resulting in permanent pain, numbness, and/or weakness of one or several areas of the body;  allergic reactions; (i.e.: anaphylactic reaction); and/or death. Furthermore, the patient was informed of those risks and complications associated with the medications. These include, but are not limited to: allergic reactions (i.e.: anaphylactic or anaphylactoid reaction(s)); adrenal axis suppression; blood sugar elevation that in diabetics may  result in ketoacidosis or comma; water retention that in patients with history of congestive heart failure may result in shortness of breath, pulmonary edema, and decompensation with resultant heart failure; weight gain; swelling or edema; medication-induced neural toxicity; particulate matter embolism and blood vessel occlusion with resultant organ, and/or nervous system infarction; and/or aseptic necrosis of one or more joints. Finally, the patient was informed that Medicine is not an exact science; therefore, there is also the possibility of unforeseen or unpredictable risks and/or possible complications that may result in a catastrophic outcome. The patient indicated having understood very clearly. We have given the patient no guarantees and we have made no promises. Enough time was given to the patient to ask questions, all of which were answered to the patient's satisfaction. Jean Gomez has indicated that she wanted to continue with the procedure. Attestation: I, the ordering provider, attest that I have discussed with the patient the benefits, risks, side-effects, alternatives, likelihood of achieving goals, and potential problems during recovery for the procedure that I have provided informed consent. Date  Time: 04/04/2018  8:27 AM  Pre-Procedure Preparation:  Monitoring: As per clinic protocol. Respiration, ETCO2, SpO2, BP, heart rate and rhythm monitor placed and checked for adequate function Safety Precautions: Patient was assessed for positional comfort and pressure points before starting the procedure. Time-out: I initiated and conducted the "Time-out"  before starting the procedure, as per protocol. The patient was asked to participate by confirming the accuracy of the "Time Out" information. Verification of the correct person, site, and procedure were performed and confirmed by me, the nursing staff, and the patient. "Time-out" conducted as per Joint Commission's Universal Protocol (UP.01.01.01). Time: 0852  Description of Procedure:       Position: Sitting Target Area: Knee Joint Approach: Just above the Lateral tibial plateau, lateral to the infrapatellar tendon. Area Prepped: Entire knee area, from the mid-thigh to the mid-shin. Prepping solution: ChloraPrep (2% chlorhexidine gluconate and 70% isopropyl alcohol) Safety Precautions: Aspiration looking for blood return was conducted prior to all injections. At no point did we inject any substances, as a needle was being advanced. No attempts were made at seeking any paresthesias. Safe injection practices and needle disposal techniques used. Medications properly checked for expiration dates. SDV (single dose vial) medications used. Description of the Procedure: Protocol guidelines were followed. The patient was placed in position over the fluoroscopy table. The target area was identified and the area prepped in the usual manner. Skin desensitized using vapocoolant spray. Skin & deeper tissues infiltrated with local anesthetic. Appropriate amount of time allowed to pass for local anesthetics to take effect. The procedure needles were then advanced to the target area. Proper needle placement secured. Negative aspiration confirmed. Solution injected in intermittent fashion, asking for systemic symptoms every 0.5cc of injectate. The needles were then removed and the area cleansed, making sure to leave some of the prepping solution back to take advantage of its long term bactericidal properties. Vitals:   04/04/18 0825 04/04/18 0856  BP: 140/84 (!) 141/91  Pulse: 82 94  Resp: 14 16  Temp: 98.4 F (36.9  C)   TempSrc: Oral   SpO2: 100% 98%  Weight: 175 lb (79.4 kg)   Height: 5\' 5"  (1.651 m)     Start Time: 0852 hrs. End Time: 0855 hrs. Materials:  Needle(s) Type: Regular needle Gauge: 22G Length: 3.5-in Medication(s): Please see orders for medications and dosing details.  Imaging Guidance:  Type of Imaging Technique: None used Indication(s): N/A Exposure Time:  No patient exposure Contrast: None used. Fluoroscopic Guidance: N/A Ultrasound Guidance: N/A Interpretation: N/A  Antibiotic Prophylaxis:   Anti-infectives (From admission, onward)   None     Indication(s): None identified  Post-operative Assessment:  Post-procedure Vital Signs:  Pulse/HCG Rate: 94  Temp: 98.4 F (36.9 C) Resp: 16 BP: (!) 141/91 SpO2: 98 %  EBL: None  Complications: No immediate post-treatment complications observed by team, or reported by patient.  Note: The patient tolerated the entire procedure well. A repeat set of vitals were taken after the procedure and the patient was kept under observation following institutional policy, for this type of procedure. Post-procedural neurological assessment was performed, showing return to baseline, prior to discharge. The patient was provided with post-procedure discharge instructions, including a section on how to identify potential problems. Should any problems arise concerning this procedure, the patient was given instructions to immediately contact us, at any time, without hesitation. In any case, we plan to contact the patient by telephone for a follow-up status report regarding this interventional procedure.  Comments:  No additional relevant information.  Plan of Care   Imaging Orders  No imaging studies ordered today    Procedure Orders     KNEE INJECTION     KNEE INJECTION  Medications ordered for procedure: Meds ordered this encounter  Medications  . lidocaine (PF) (XYLOCAINE) 1 % injection 4 mL  . ropivacaine (PF) 2 mg/mL (0.2%)  (NAROPIN) injection 4 mL  . Sodium Hyaluronate SOSY 2 mL  . Sodium Hyaluronate SOSY 2 mL   Medications administered: We administered lidocaine (PF), ropivacaine (PF) 2 mg/mL (0.2%), Sodium Hyaluronate, and Sodium Hyaluronate.  See the medical record for exact dosing, route, and time of administration.  New Prescriptions   No medications on file   Disposition: Discharge home  Discharge Date & Time: 04/04/2018; 0900 hrs.   Physician-requested Follow-up: Return for PPE (2 wks) + Procedure (no sedation): (B) IA Hyalgan #2.  Future Appointments  Date Time Provider Department Center  04/24/2018  8:30 AM Barbette MerinoKing, Crystal M, NP [facility] None  04/30/2018  9:00 AM Delano MetzNaveira, Trevor Duty[facility]   Primary Care Physician: Lauro RegulusAnderson, Marshall W, MD [facility] Note by: Oswaldo DoneFrancisco A Adrick Kestler, MD Date: 04/04/2018; Time: 9:20 AM  Disclaimer:  Medicine is not an Visual merchandiserexact science. The only guarantee in medicine is that nothing is guaranteed. It is important to note that the decision to proceed with this intervention was based on the information collected from the patient. The Data and conclusions were drawn from the patient's questionnaire, the interview, and the physical examination. Because the information was provided in large part by the patient, it cannot be guaranteed that it has not been purposely or unconsciously manipulated. Every effort has been made to obtain as much relevant data as possible for this evaluation. It is important to note that the conclusions that lead to this procedure are derived in large part from the available data. Always take into account that the treatment will also be dependent on availability of resources and existing treatment guidelines, considered by other Pain Management Practitioners as being common knowledge and practice, at the time of the intervention. For Medico-Legal purposes, it is also important to point out that variation in  procedural techniques and pharmacological choices are the acceptable norm. The indications, contraindications, technique, and results of the above procedure should only be interpreted and judged by a Board-Certified Interventional Pain Specialist with extensive familiarity and expertise in the same exact procedure  and technique.

## 2018-04-04 NOTE — Patient Instructions (Addendum)
____________________________________________________________________________________________  Post-Procedure Discharge Instructions  Instructions:  Apply ice: Fill a plastic sandwich bag with crushed ice. Cover it with a small towel and apply to injection site. Apply for 15 minutes then remove x 15 minutes. Repeat sequence on day of procedure, until you go to bed. The purpose is to minimize swelling and discomfort after procedure.  Apply heat: Apply heat to procedure site starting the day following the procedure. The purpose is to treat any soreness and discomfort from the procedure.  Food intake: Start with clear liquids (like water) and advance to regular food, as tolerated.   Physical activities: Keep activities to a minimum for the first 8 hours after the procedure.   Driving: If you have received any sedation, you are not allowed to drive for 24 hours after your procedure.  Blood thinner: Restart your blood thinner 6 hours after your procedure. (Only for those taking blood thinners)  Insulin: As soon as you can eat, you may resume your normal dosing schedule. (Only for those taking insulin)  Infection prevention: Keep procedure site clean and dry.  Post-procedure Pain Diary: Extremely important that this be done correctly and accurately. Recorded information will be used to determine the next step in treatment.  Pain evaluated is that of treated area only. Do not include pain from an untreated area.  Complete every hour, on the hour, for the initial 8 hours. Set an alarm to help you do this part accurately.  Do not go to sleep and have it completed later. It will not be accurate.  Follow-up appointment: Keep your follow-up appointment after the procedure. Usually 2 weeks for most procedures. (6 weeks in the case of radiofrequency.) Bring you pain diary.   Expect:  From numbing medicine (AKA: Local Anesthetics): Numbness or decrease in pain.  Onset: Full effect within 15  minutes of injected.  Duration: It will depend on the type of local anesthetic used. On the average, 1 to 8 hours.   From steroids: Decrease in swelling or inflammation. Once inflammation is improved, relief of the pain will follow.  Onset of benefits: Depends on the amount of swelling present. The more swelling, the longer it will take for the benefits to be seen. In some cases, up to 10 days.  Duration: Steroids will stay in the system x 2 weeks. Duration of benefits will depend on multiple posibilities including persistent irritating factors.  From procedure: Some discomfort is to be expected once the numbing medicine wears off. This should be minimal if ice and heat are applied as instructed.  Call if:  You experience numbness and weakness that gets worse with time, as opposed to wearing off.  New onset bowel or bladder incontinence. (This applies to Spinal procedures only)  Emergency Numbers:  Durning business hours (Monday - Thursday, 8:00 AM - 4:00 PM) (Friday, 9:00 AM - 12:00 Noon): [phone]  After hours: [phone] ____________________________________________________________________________________________   ____________________________________________________________________________________________  Preparing for your procedure (without sedation)  Instructions: . Oral Intake: Do not eat or drink anything for at least 3 hours prior to your procedure. . Transportation: Unless otherwise stated by your physician, you may drive yourself after the procedure. . Blood Pressure Medicine: Take your blood pressure medicine with a sip of water the morning of the procedure. . Blood thinners:  . Diabetics on insulin: Notify the staff so that you can be scheduled 1st case in the morning. If your diabetes requires high dose insulin, take only  of your normal insulin dose   the morning of the procedure and notify the staff that you have done so. . Preventing infections: Shower  with an antibacterial soap the morning of your procedure.  . Build-up your immune system: Take 1000 mg of Vitamin C with every meal (3 times a day) the day prior to your procedure. Marland Kitchen. Antibiotics: Inform the staff if you have a condition or reason that requires you to take antibiotics before dental procedures. . Pregnancy: If you are pregnant, call and cancel the procedure. . Sickness: If you have a cold, fever, or any active infections, call and cancel the procedure. . Arrival: You must be in the facility at least 30 minutes prior to your scheduled procedure. . Children: Do not bring any children with you. . Dress appropriately: Bring dark clothing that you would not mind if they get stained. . Valuables: Do not bring any jewelry or valuables.  Procedure appointments are reserved for interventional treatments only. Marland Kitchen. No Prescription Refills. . No medication changes will be discussed during procedure appointments. . No disability issues will be discussed.  Remember:  Regular Business hours are:  Monday to Thursday 8:00 AM to 4:00 PM  Provider's Schedule: Delano MetzFrancisco Pastor Sgro, MD:  Procedure days: Tuesday and Thursday 7:30 AM to 4:00 PM  Edward JollyBilal Lateef, MD:  Procedure days: Monday and Wednesday 7:30 AM to 4:00 PM ____________________________________________________________________________________________   ____________________________________________________________________________________________  Pain Scale  Introduction: The pain score used by this practice is the Verbal Numerical Rating Scale (VNRS-11). This is an 11-point scale. It is for adults and children 10 years or older. There are significant differences in how the pain score is reported, used, and applied. Forget everything you learned in the past and learn this scoring system.  General Information: The scale should reflect your current level of pain. Unless you are specifically asked for the level of your worst pain, or your  average pain. If you are asked for one of these two, then it should be understood that it is over the past 24 hours.  Basic Activities of Daily Living (ADL): Personal hygiene, dressing, eating, transferring, and using restroom.  Instructions: Most patients tend to report their level of pain as a combination of two factors, their physical pain and their psychosocial pain. This last one is also known as "suffering" and it is reflection of how physical pain affects you socially and psychologically. From now on, report them separately. From this point on, when asked to report your pain level, report only your physical pain. Use the following table for reference.  Pain Clinic Pain Levels (0-5/10)  Pain Level Score  Description  No Pain 0   Mild pain 1 Nagging, annoying, but does not interfere with basic activities of daily living (ADL). Patients are able to eat, bathe, get dressed, toileting (being able to get on and off the toilet and perform personal hygiene functions), transfer (move in and out of bed or a chair without assistance), and maintain continence (able to control bladder and bowel functions). Blood pressure and heart rate are unaffected. A normal heart rate for a healthy adult ranges from 60 to 100 bpm (beats per minute).   Mild to moderate pain 2 Noticeable and distracting. Impossible to hide from other people. More frequent flare-ups. Still possible to adapt and function close to normal. It can be very annoying and may have occasional stronger flare-ups. With discipline, patients may get used to it and adapt.   Moderate pain 3 Interferes significantly with activities of daily living (ADL). It becomes  difficult to feed, bathe, get dressed, get on and off the toilet or to perform personal hygiene functions. Difficult to get in and out of bed or a chair without assistance. Very distracting. With effort, it can be ignored when deeply involved in activities.   Moderately severe pain 4 Impossible  to ignore for more than a few minutes. With effort, patients may still be able to manage work or participate in some social activities. Very difficult to concentrate. Signs of autonomic nervous system discharge are evident: dilated pupils (mydriasis); mild sweating (diaphoresis); sleep interference. Heart rate becomes elevated (>115 bpm). Diastolic blood pressure (lower number) rises above 100 mmHg. Patients find relief in laying down and not moving.   Severe pain 5 Intense and extremely unpleasant. Associated with frowning face and frequent crying. Pain overwhelms the senses.  Ability to do any activity or maintain social relationships becomes significantly limited. Conversation becomes difficult. Pacing back and forth is common, as getting into a comfortable position is nearly impossible. Pain wakes you up from deep sleep. Physical signs will be obvious: pupillary dilation; increased sweating; goosebumps; brisk reflexes; cold, clammy hands and feet; nausea, vomiting or dry heaves; loss of appetite; significant sleep disturbance with inability to fall asleep or to remain asleep. When persistent, significant weight loss is observed due to the complete loss of appetite and sleep deprivation.  Blood pressure and heart rate becomes significantly elevated. Caution: If elevated blood pressure triggers a pounding headache associated with blurred vision, then the patient should immediately seek attention at an urgent or emergency care unit, as these may be signs of an impending stroke.    Emergency Department Pain Levels (6-10/10)  Emergency Room Pain 6 Severely limiting. Requires emergency care and should not be seen or managed at an outpatient pain management facility. Communication becomes difficult and requires great effort. Assistance to reach the emergency department may be required. Facial flushing and profuse sweating along with potentially dangerous increases in heart rate and blood pressure will be  evident.   Distressing pain 7 Self-care is very difficult. Assistance is required to transport, or use restroom. Assistance to reach the emergency department will be required. Tasks requiring coordination, such as bathing and getting dressed become very difficult.   Disabling pain 8 Self-care is no longer possible. At this level, pain is disabling. The individual is unable to do even the most "basic" activities such as walking, eating, bathing, dressing, transferring to a bed, or toileting. Fine motor skills are lost. It is difficult to think clearly.   Incapacitating pain 9 Pain becomes incapacitating. Thought processing is no longer possible. Difficult to remember your own name. Control of movement and coordination are lost.   The worst pain imaginable 10 At this level, most patients pass out from pain. When this level is reached, collapse of the autonomic nervous system occurs, leading to a sudden drop in blood pressure and heart rate. This in turn results in a temporary and dramatic drop in blood flow to the brain, leading to a loss of consciousness. Fainting is one of the body's self defense mechanisms. Passing out puts the brain in a calmed state and causes it to shut down for a while, in order to begin the healing process.    Summary: 1. Refer to this scale when providing [place] with your pain level. 2. Be accurate and careful when reporting your pain level. This will help with your care. 3. Over-reporting your pain level will lead to loss of credibility. 4. Even a  level of 1/10 means that there is pain and will be treated at our facility. 5. High, inaccurate reporting will be documented as "Symptom Exaggeration", leading to loss of credibility and suspicions of possible secondary gains such as obtaining more narcotics, or wanting to appear disabled, for fraudulent reasons. 6. Only pain levels of 5 or below will be seen at our facility. 7. Pain levels of 6 and above will be sent to the Emergency  Department and the appointment cancelled. ____________________________________________________________________________________________

## 2018-04-04 NOTE — Progress Notes (Signed)
Safety precautions to be maintained throughout the outpatient stay will include: orient to surroundings, keep bed in low position, maintain call bell within reach at all times, provide assistance with transfer out of bed and ambulation.  

## 2018-04-05 ENCOUNTER — Telehealth: Payer: Self-pay

## 2018-04-05 NOTE — Telephone Encounter (Signed)
Post procedure phone call.  Left message.  

## 2018-04-09 ENCOUNTER — Other Ambulatory Visit: Payer: Self-pay | Admitting: Nurse Practitioner

## 2018-04-09 DIAGNOSIS — M5441 Lumbago with sciatica, right side: Principal | ICD-10-CM

## 2018-04-09 DIAGNOSIS — M7918 Myalgia, other site: Secondary | ICD-10-CM

## 2018-04-09 DIAGNOSIS — G8929 Other chronic pain: Secondary | ICD-10-CM

## 2018-04-17 ENCOUNTER — Other Ambulatory Visit: Payer: Self-pay

## 2018-04-17 ENCOUNTER — Emergency Department
Admission: EM | Admit: 2018-04-17 | Discharge: 2018-04-17 | Disposition: A | Discharge status: Other Acute Inpatient Hospital | Payer: Medicare Other | Attending: Emergency Medicine | Admitting: Emergency Medicine

## 2018-04-17 ENCOUNTER — Inpatient Hospital Stay
Admission: AD | Admit: 2018-04-17 | Discharge: 2018-04-19 | DRG: 885 | Disposition: A | Discharge status: Home / Self Care | Payer: Medicare Other | Attending: Psychiatry | Admitting: Psychiatry

## 2018-04-17 ENCOUNTER — Encounter: Payer: Self-pay | Admitting: *Deleted

## 2018-04-17 DIAGNOSIS — Z888 Allergy status to other drugs, medicaments and biological substances status: Secondary | ICD-10-CM | POA: Diagnosis not present

## 2018-04-17 DIAGNOSIS — F312 Bipolar disorder, current episode manic severe with psychotic features: Secondary | ICD-10-CM | POA: Diagnosis not present

## 2018-04-17 DIAGNOSIS — Z79899 Other long term (current) drug therapy: Secondary | ICD-10-CM | POA: Insufficient documentation

## 2018-04-17 DIAGNOSIS — Z882 Allergy status to sulfonamides status: Secondary | ICD-10-CM

## 2018-04-17 DIAGNOSIS — I10 Essential (primary) hypertension: Secondary | ICD-10-CM | POA: Diagnosis present

## 2018-04-17 DIAGNOSIS — J449 Chronic obstructive pulmonary disease, unspecified: Secondary | ICD-10-CM | POA: Diagnosis present

## 2018-04-17 DIAGNOSIS — E7800 Pure hypercholesterolemia, unspecified: Secondary | ICD-10-CM | POA: Diagnosis present

## 2018-04-17 DIAGNOSIS — E876 Hypokalemia: Secondary | ICD-10-CM | POA: Insufficient documentation

## 2018-04-17 DIAGNOSIS — M5030 Other cervical disc degeneration, unspecified cervical region: Secondary | ICD-10-CM | POA: Diagnosis present

## 2018-04-17 DIAGNOSIS — M5136 Other intervertebral disc degeneration, lumbar region: Secondary | ICD-10-CM | POA: Diagnosis present

## 2018-04-17 DIAGNOSIS — F3113 Bipolar disorder, current episode manic without psychotic features, severe: Secondary | ICD-10-CM | POA: Diagnosis not present

## 2018-04-17 DIAGNOSIS — K589 Irritable bowel syndrome without diarrhea: Secondary | ICD-10-CM | POA: Diagnosis present

## 2018-04-17 DIAGNOSIS — F419 Anxiety disorder, unspecified: Secondary | ICD-10-CM | POA: Diagnosis present

## 2018-04-17 DIAGNOSIS — M25559 Pain in unspecified hip: Secondary | ICD-10-CM | POA: Diagnosis present

## 2018-04-17 DIAGNOSIS — D869 Sarcoidosis, unspecified: Secondary | ICD-10-CM | POA: Diagnosis present

## 2018-04-17 DIAGNOSIS — F3110 Bipolar disorder, current episode manic without psychotic features, unspecified: Secondary | ICD-10-CM | POA: Diagnosis present

## 2018-04-17 DIAGNOSIS — R4689 Other symptoms and signs involving appearance and behavior: Secondary | ICD-10-CM

## 2018-04-17 DIAGNOSIS — M170 Bilateral primary osteoarthritis of knee: Secondary | ICD-10-CM | POA: Diagnosis present

## 2018-04-17 DIAGNOSIS — K219 Gastro-esophageal reflux disease without esophagitis: Secondary | ICD-10-CM | POA: Diagnosis present

## 2018-04-17 DIAGNOSIS — G4733 Obstructive sleep apnea (adult) (pediatric): Secondary | ICD-10-CM | POA: Diagnosis present

## 2018-04-17 DIAGNOSIS — M19012 Primary osteoarthritis, left shoulder: Secondary | ICD-10-CM | POA: Diagnosis present

## 2018-04-17 DIAGNOSIS — M19011 Primary osteoarthritis, right shoulder: Secondary | ICD-10-CM | POA: Diagnosis present

## 2018-04-17 DIAGNOSIS — M25552 Pain in left hip: Secondary | ICD-10-CM

## 2018-04-17 DIAGNOSIS — G894 Chronic pain syndrome: Secondary | ICD-10-CM | POA: Diagnosis present

## 2018-04-17 DIAGNOSIS — F319 Bipolar disorder, unspecified: Secondary | ICD-10-CM | POA: Diagnosis present

## 2018-04-17 DIAGNOSIS — Z7982 Long term (current) use of aspirin: Secondary | ICD-10-CM | POA: Insufficient documentation

## 2018-04-17 DIAGNOSIS — M25551 Pain in right hip: Secondary | ICD-10-CM

## 2018-04-17 DIAGNOSIS — Z880 Allergy status to penicillin: Secondary | ICD-10-CM

## 2018-04-17 DIAGNOSIS — G40909 Epilepsy, unspecified, not intractable, without status epilepticus: Secondary | ICD-10-CM | POA: Diagnosis present

## 2018-04-17 DIAGNOSIS — Z9989 Dependence on other enabling machines and devices: Secondary | ICD-10-CM

## 2018-04-17 DIAGNOSIS — G250 Essential tremor: Secondary | ICD-10-CM | POA: Diagnosis present

## 2018-04-17 DIAGNOSIS — M5441 Lumbago with sciatica, right side: Secondary | ICD-10-CM

## 2018-04-17 DIAGNOSIS — G8929 Other chronic pain: Secondary | ICD-10-CM | POA: Diagnosis present

## 2018-04-17 DIAGNOSIS — F411 Generalized anxiety disorder: Secondary | ICD-10-CM | POA: Diagnosis present

## 2018-04-17 DIAGNOSIS — F329 Major depressive disorder, single episode, unspecified: Secondary | ICD-10-CM | POA: Insufficient documentation

## 2018-04-17 DIAGNOSIS — Z981 Arthrodesis status: Secondary | ICD-10-CM

## 2018-04-17 DIAGNOSIS — Z7951 Long term (current) use of inhaled steroids: Secondary | ICD-10-CM

## 2018-04-17 DIAGNOSIS — J9610 Chronic respiratory failure, unspecified whether with hypoxia or hypercapnia: Secondary | ICD-10-CM | POA: Diagnosis present

## 2018-04-17 DIAGNOSIS — Z8669 Personal history of other diseases of the nervous system and sense organs: Secondary | ICD-10-CM

## 2018-04-17 LAB — BASIC METABOLIC PANEL
Anion gap: 10 (ref 5–15)
BUN: 8 mg/dL (ref 6–20)
CALCIUM: 9.6 mg/dL (ref 8.9–10.3)
CO2: 31 mmol/L (ref 22–32)
Chloride: 97 mmol/L — ABNORMAL LOW (ref 101–111)
Creatinine, Ser: 0.62 mg/dL (ref 0.44–1.00)
GFR calc non Af Amer: >60 mL/min (ref >60)
GLUCOSE: 100 mg/dL — AB (ref 65–99)
Potassium: 3.1 mmol/L — ABNORMAL LOW (ref 3.5–5.1)
Sodium: 138 mmol/L (ref 135–145)

## 2018-04-17 LAB — ACETAMINOPHEN LEVEL: Acetaminophen (Tylenol), Serum: <10 ug/mL — ABNORMAL LOW (ref 10–30)

## 2018-04-17 LAB — SALICYLATE LEVEL

## 2018-04-17 LAB — COMPREHENSIVE METABOLIC PANEL
ALBUMIN: 4.9 g/dL (ref 3.5–5.0)
ALT: 22 U/L (ref 14–54)
ANION GAP: 11 (ref 5–15)
AST: 31 U/L (ref 15–41)
Alkaline Phosphatase: 65 U/L (ref 38–126)
BUN: 10 mg/dL (ref 6–20)
CHLORIDE: 96 mmol/L — AB (ref 101–111)
CO2: 31 mmol/L (ref 22–32)
Calcium: 9.9 mg/dL (ref 8.9–10.3)
Creatinine, Ser: 0.73 mg/dL (ref 0.44–1.00)
GFR calc non Af Amer: >60 mL/min (ref >60)
GLUCOSE: 146 mg/dL — AB (ref 65–99)
POTASSIUM: 2.6 mmol/L — AB (ref 3.5–5.1)
SODIUM: 138 mmol/L (ref 135–145)
Total Bilirubin: 0.3 mg/dL (ref 0.3–1.2)
Total Protein: 8.6 g/dL — ABNORMAL HIGH (ref 6.5–8.1)

## 2018-04-17 LAB — CBC
HEMATOCRIT: 33.1 % — AB (ref 35.0–47.0)
Hemoglobin: 10.8 g/dL — ABNORMAL LOW (ref 12.0–16.0)
MCH: 24.6 pg — ABNORMAL LOW (ref 26.0–34.0)
MCHC: 32.7 g/dL (ref 32.0–36.0)
MCV: 75.3 fL — ABNORMAL LOW (ref 80.0–100.0)
Platelets: 423 10*3/uL (ref 150–440)
RBC: 4.4 MIL/uL (ref 3.80–5.20)
RDW: 18.8 % — ABNORMAL HIGH (ref 11.5–14.5)
WBC: 8.1 10*3/uL (ref 3.6–11.0)

## 2018-04-17 LAB — MAGNESIUM
Magnesium: 1.3 mg/dL — ABNORMAL LOW (ref 1.7–2.4)
Magnesium: 1.8 mg/dL (ref 1.7–2.4)

## 2018-04-17 LAB — URINE DRUG SCREEN, QUALITATIVE (ARMC ONLY)
AMPHETAMINES, UR SCREEN: NOT DETECTED
BARBITURATES, UR SCREEN: NOT DETECTED
Benzodiazepine, Ur Scrn: NOT DETECTED
COCAINE METABOLITE, UR ~~LOC~~: NOT DETECTED
Cannabinoid 50 Ng, Ur ~~LOC~~: NOT DETECTED
MDMA (ECSTASY) UR SCREEN: NOT DETECTED
METHADONE SCREEN, URINE: NOT DETECTED
OPIATE, UR SCREEN: POSITIVE — AB
Phencyclidine (PCP) Ur S: NOT DETECTED
TRICYCLIC, UR SCREEN: POSITIVE — AB

## 2018-04-17 LAB — ETHANOL: Alcohol, Ethyl (B): <10 mg/dL (ref <10)

## 2018-04-17 MED ORDER — QUETIAPINE FUMARATE 25 MG PO TABS
50.0000 mg | ORAL_TABLET | Freq: Every day | ORAL | Status: DC
  Administered 2018-04-17: 50 mg via ORAL
  Filled 2018-04-17: qty 2

## 2018-04-17 MED ORDER — LEVETIRACETAM 750 MG PO TABS
750.0000 mg | ORAL_TABLET | Freq: Every day | ORAL | Status: DC
  Administered 2018-04-18 – 2018-04-19 (×2): 750 mg via ORAL
  Filled 2018-04-17 (×2): qty 1

## 2018-04-17 MED ORDER — BETAMETHASONE DIPROPIONATE AUG 0.05 % EX LOTN: TOPICAL_LOTION | Freq: Two times a day (BID) | CUTANEOUS | Status: DC

## 2018-04-17 MED ORDER — HYDROCODONE-ACETAMINOPHEN 10-325 MG PO TABS
1.0000 | ORAL_TABLET | Freq: Three times a day (TID) | ORAL | Status: DC | PRN
  Administered 2018-04-17: 1 via ORAL
  Filled 2018-04-17: qty 1

## 2018-04-17 MED ORDER — DICLOFENAC SODIUM 1 % TD GEL
2.0000 g | Freq: Four times a day (QID) | TRANSDERMAL | Status: DC
  Administered 2018-04-17 (×2): 2 g via TOPICAL
  Filled 2018-04-17: qty 100

## 2018-04-17 MED ORDER — TIZANIDINE HCL 4 MG PO TABS
4.0000 mg | ORAL_TABLET | Freq: Three times a day (TID) | ORAL | Status: DC | PRN
  Filled 2018-04-17: qty 1

## 2018-04-17 MED ORDER — AMLODIPINE BESYLATE 5 MG PO TABS
10.0000 mg | ORAL_TABLET | Freq: Every day | ORAL | Status: DC
  Administered 2018-04-17: 10 mg via ORAL
  Filled 2018-04-17: qty 2

## 2018-04-17 MED ORDER — MAGNESIUM HYDROXIDE 400 MG/5ML PO SUSP: 30.0000 mL | Freq: Every day | ORAL | Status: DC | PRN

## 2018-04-17 MED ORDER — POTASSIUM CHLORIDE CRYS ER 20 MEQ PO TBCR
40.0000 meq | EXTENDED_RELEASE_TABLET | Freq: Once | ORAL | Status: DC
  Administered 2018-04-17: 40 meq via ORAL

## 2018-04-17 MED ORDER — VENLAFAXINE HCL 37.5 MG PO TABS
75.0000 mg | ORAL_TABLET | Freq: Every day | ORAL | Status: DC
  Administered 2018-04-18 – 2018-04-19 (×2): 75 mg via ORAL
  Filled 2018-04-17 (×2): qty 2

## 2018-04-17 MED ORDER — PANTOPRAZOLE SODIUM 40 MG PO TBEC
40.0000 mg | DELAYED_RELEASE_TABLET | Freq: Every day | ORAL | Status: DC
  Administered 2018-04-18 – 2018-04-19 (×2): 40 mg via ORAL
  Filled 2018-04-17 (×2): qty 1

## 2018-04-17 MED ORDER — POTASSIUM CHLORIDE CRYS ER 20 MEQ PO TBCR
40.0000 meq | EXTENDED_RELEASE_TABLET | Freq: Two times a day (BID) | ORAL | Status: DC
  Administered 2018-04-17: 40 meq via ORAL
  Filled 2018-04-17 (×2): qty 2

## 2018-04-17 MED ORDER — FLUTICASONE FUROATE-VILANTEROL 100-25 MCG/INH IN AEPB
1.0000 | INHALATION_SPRAY | Freq: Every day | RESPIRATORY_TRACT | Status: DC
  Administered 2018-04-18 – 2018-04-19 (×2): 1 via RESPIRATORY_TRACT
  Filled 2018-04-17: qty 28

## 2018-04-17 MED ORDER — LEVETIRACETAM 750 MG PO TABS
750.0000 mg | ORAL_TABLET | Freq: Every day | ORAL | Status: DC
  Administered 2018-04-17: 750 mg via ORAL
  Filled 2018-04-17: qty 1

## 2018-04-17 MED ORDER — MIRTAZAPINE 15 MG PO TABS: 15.0000 mg | ORAL_TABLET | Freq: Every day | ORAL | Status: DC

## 2018-04-17 MED ORDER — MAGNESIUM OXIDE 400 (241.3 MG) MG PO TABS
400.0000 mg | ORAL_TABLET | Freq: Two times a day (BID) | ORAL | Status: DC
  Administered 2018-04-17: 400 mg via ORAL
  Filled 2018-04-17 (×2): qty 1

## 2018-04-17 MED ORDER — ALBUTEROL SULFATE (2.5 MG/3ML) 0.083% IN NEBU
2.5000 mg | INHALATION_SOLUTION | RESPIRATORY_TRACT | Status: DC | PRN
  Administered 2018-04-17: 2.5 mg via RESPIRATORY_TRACT
  Filled 2018-04-17: qty 3

## 2018-04-17 MED ORDER — ESCITALOPRAM OXALATE 10 MG PO TABS
20.0000 mg | ORAL_TABLET | Freq: Every day | ORAL | Status: DC
  Administered 2018-04-18 – 2018-04-19 (×2): 20 mg via ORAL
  Filled 2018-04-17 (×2): qty 2

## 2018-04-17 MED ORDER — CLOTRIMAZOLE 1 % EX CREA
TOPICAL_CREAM | Freq: Two times a day (BID) | CUTANEOUS | Status: DC
  Administered 2018-04-17: 14:00:00 via TOPICAL
  Filled 2018-04-17: qty 15

## 2018-04-17 MED ORDER — LORAZEPAM 2 MG/ML IJ SOLN: 0.0000 mg | Freq: Four times a day (QID) | INTRAMUSCULAR | Status: DC

## 2018-04-17 MED ORDER — ESCITALOPRAM OXALATE 10 MG PO TABS
20.0000 mg | ORAL_TABLET | Freq: Every day | ORAL | Status: DC
  Administered 2018-04-17: 20 mg via ORAL
  Filled 2018-04-17: qty 2

## 2018-04-17 MED ORDER — QUETIAPINE FUMARATE 25 MG PO TABS: 50.0000 mg | ORAL_TABLET | Freq: Every day | ORAL | Status: DC

## 2018-04-17 MED ORDER — IPRATROPIUM BROMIDE 0.02 % IN SOLN: 0.5000 mg | RESPIRATORY_TRACT | Status: DC | PRN

## 2018-04-17 MED ORDER — VITAMIN B-1 100 MG PO TABS
100.0000 mg | ORAL_TABLET | Freq: Every day | ORAL | Status: DC
  Administered 2018-04-17: 100 mg via ORAL
  Filled 2018-04-17: qty 1

## 2018-04-17 MED ORDER — CLOTRIMAZOLE 1 % EX CREA
1.0000 "application " | TOPICAL_CREAM | Freq: Two times a day (BID) | CUTANEOUS | Status: DC
  Administered 2018-04-17 – 2018-04-19 (×4): 1 via TOPICAL
  Filled 2018-04-17: qty 15

## 2018-04-17 MED ORDER — TIZANIDINE HCL 4 MG PO TABS
4.0000 mg | ORAL_TABLET | Freq: Once | ORAL | Status: AC
  Administered 2018-04-17: 4 mg via ORAL
  Filled 2018-04-17: qty 1

## 2018-04-17 MED ORDER — POTASSIUM CHLORIDE CRYS ER 20 MEQ PO TBCR
40.0000 meq | EXTENDED_RELEASE_TABLET | Freq: Two times a day (BID) | ORAL | Status: DC
  Administered 2018-04-17 – 2018-04-19 (×5): 40 meq via ORAL
  Filled 2018-04-17 (×5): qty 2

## 2018-04-17 MED ORDER — PANTOPRAZOLE SODIUM 40 MG PO TBEC
40.0000 mg | DELAYED_RELEASE_TABLET | Freq: Every day | ORAL | Status: DC
  Administered 2018-04-17: 40 mg via ORAL
  Filled 2018-04-17: qty 1

## 2018-04-17 MED ORDER — AMLODIPINE BESYLATE 5 MG PO TABS
10.0000 mg | ORAL_TABLET | Freq: Every day | ORAL | Status: DC
  Administered 2018-04-18 – 2018-04-19 (×2): 10 mg via ORAL
  Filled 2018-04-17 (×2): qty 2

## 2018-04-17 MED ORDER — LORAZEPAM 2 MG/ML IJ SOLN: 0.0000 mg | Freq: Two times a day (BID) | INTRAMUSCULAR | Status: DC

## 2018-04-17 MED ORDER — TORSEMIDE 10 MG PO TABS
5.0000 mg | ORAL_TABLET | Freq: Every day | ORAL | Status: DC
  Administered 2018-04-18 – 2018-04-19 (×2): 5 mg via ORAL
  Filled 2018-04-17 (×2): qty 0.5

## 2018-04-17 MED ORDER — IPRATROPIUM BROMIDE 0.02 % IN SOLN
0.5000 mg | RESPIRATORY_TRACT | Status: DC | PRN
  Filled 2018-04-17: qty 2.5

## 2018-04-17 MED ORDER — ALBUTEROL SULFATE (2.5 MG/3ML) 0.083% IN NEBU: 2.5000 mg | INHALATION_SOLUTION | RESPIRATORY_TRACT | Status: DC | PRN

## 2018-04-17 MED ORDER — VENLAFAXINE HCL 75 MG PO TABS
75.0000 mg | ORAL_TABLET | Freq: Every day | ORAL | Status: DC
  Administered 2018-04-17: 75 mg via ORAL
  Filled 2018-04-17: qty 1

## 2018-04-17 MED ORDER — SUCRALFATE 1 G PO TABS
1.0000 g | ORAL_TABLET | Freq: Four times a day (QID) | ORAL | Status: DC
  Administered 2018-04-17 – 2018-04-19 (×8): 1 g via ORAL
  Filled 2018-04-17 (×9): qty 1

## 2018-04-17 MED ORDER — THIAMINE HCL 100 MG/ML IJ SOLN: 100.0000 mg | Freq: Every day | INTRAMUSCULAR | Status: DC

## 2018-04-17 MED ORDER — TIZANIDINE HCL 4 MG PO TABS
4.0000 mg | ORAL_TABLET | Freq: Three times a day (TID) | ORAL | Status: DC | PRN
  Administered 2018-04-17: 4 mg via ORAL
  Filled 2018-04-17: qty 1

## 2018-04-17 MED ORDER — ASPIRIN EC 81 MG PO TBEC
81.0000 mg | DELAYED_RELEASE_TABLET | Freq: Every day | ORAL | Status: DC
  Administered 2018-04-17: 81 mg via ORAL
  Filled 2018-04-17: qty 1

## 2018-04-17 MED ORDER — LOSARTAN POTASSIUM 50 MG PO TABS
25.0000 mg | ORAL_TABLET | Freq: Every day | ORAL | Status: DC
  Administered 2018-04-17: 25 mg via ORAL
  Filled 2018-04-17: qty 1

## 2018-04-17 MED ORDER — ATORVASTATIN CALCIUM 20 MG PO TABS
10.0000 mg | ORAL_TABLET | Freq: Every day | ORAL | Status: DC
  Administered 2018-04-17: 10 mg via ORAL
  Filled 2018-04-17: qty 1

## 2018-04-17 MED ORDER — LOSARTAN POTASSIUM 25 MG PO TABS
25.0000 mg | ORAL_TABLET | Freq: Every day | ORAL | Status: DC
  Administered 2018-04-18 – 2018-04-19 (×2): 25 mg via ORAL
  Filled 2018-04-17 (×2): qty 1

## 2018-04-17 MED ORDER — FLUTICASONE FUROATE-VILANTEROL 100-25 MCG/INH IN AEPB
1.0000 | INHALATION_SPRAY | Freq: Every day | RESPIRATORY_TRACT | Status: DC
  Filled 2018-04-17 (×2): qty 28

## 2018-04-17 MED ORDER — LORAZEPAM 2 MG PO TABS: 0.0000 mg | ORAL_TABLET | Freq: Two times a day (BID) | ORAL | Status: DC

## 2018-04-17 MED ORDER — HYDROCHLOROTHIAZIDE 25 MG PO TABS
25.0000 mg | ORAL_TABLET | Freq: Every day | ORAL | Status: DC
  Administered 2018-04-18 – 2018-04-19 (×2): 25 mg via ORAL
  Filled 2018-04-17 (×2): qty 1

## 2018-04-17 MED ORDER — ASPIRIN EC 81 MG PO TBEC
81.0000 mg | DELAYED_RELEASE_TABLET | Freq: Every day | ORAL | Status: DC
  Administered 2018-04-18 – 2018-04-19 (×2): 81 mg via ORAL
  Filled 2018-04-17 (×2): qty 1

## 2018-04-17 MED ORDER — MAGNESIUM OXIDE 400 (241.3 MG) MG PO TABS
400.0000 mg | ORAL_TABLET | Freq: Two times a day (BID) | ORAL | Status: DC
  Administered 2018-04-17 – 2018-04-19 (×5): 400 mg via ORAL
  Filled 2018-04-17 (×5): qty 1

## 2018-04-17 MED ORDER — ATORVASTATIN CALCIUM 20 MG PO TABS
10.0000 mg | ORAL_TABLET | Freq: Every day | ORAL | Status: DC
  Administered 2018-04-18 – 2018-04-19 (×2): 10 mg via ORAL
  Filled 2018-04-17 (×2): qty 1

## 2018-04-17 MED ORDER — HYDROCODONE-ACETAMINOPHEN 5-325 MG PO TABS
2.0000 | ORAL_TABLET | Freq: Three times a day (TID) | ORAL | Status: DC | PRN
Start: 2018-04-17 — End: 2018-04-17
  Administered 2018-04-17: 2 via ORAL
  Filled 2018-04-17: qty 2

## 2018-04-17 MED ORDER — LORAZEPAM 2 MG PO TABS
0.0000 mg | ORAL_TABLET | Freq: Four times a day (QID) | ORAL | Status: DC
  Administered 2018-04-17: 1 mg via ORAL
  Filled 2018-04-17: qty 1

## 2018-04-17 MED ORDER — MOMETASONE FURO-FORMOTEROL FUM 200-5 MCG/ACT IN AERO: 2.0000 | INHALATION_SPRAY | Freq: Two times a day (BID) | RESPIRATORY_TRACT | Status: DC

## 2018-04-17 MED ORDER — TORSEMIDE 5 MG PO TABS
5.0000 mg | ORAL_TABLET | Freq: Every day | ORAL | Status: DC
  Administered 2018-04-17: 5 mg via ORAL
  Filled 2018-04-17: qty 1

## 2018-04-17 MED ORDER — ACETAMINOPHEN 325 MG PO TABS: 650.0000 mg | ORAL_TABLET | Freq: Four times a day (QID) | ORAL | Status: DC | PRN

## 2018-04-17 MED ORDER — DICLOFENAC SODIUM 1 % TD GEL
2.0000 g | Freq: Four times a day (QID) | TRANSDERMAL | Status: DC
  Administered 2018-04-17 – 2018-04-19 (×6): 2 g via TOPICAL
  Filled 2018-04-17: qty 100

## 2018-04-17 MED ORDER — ALBUTEROL SULFATE (2.5 MG/3ML) 0.083% IN NEBU
2.5000 mg | INHALATION_SOLUTION | Freq: Three times a day (TID) | RESPIRATORY_TRACT | Status: DC
  Administered 2018-04-18 (×2): 2.5 mg via RESPIRATORY_TRACT
  Filled 2018-04-17 (×2): qty 3

## 2018-04-17 MED ORDER — ALUM & MAG HYDROXIDE-SIMETH 200-200-20 MG/5ML PO SUSP: 30.0000 mL | ORAL | Status: DC | PRN

## 2018-04-17 MED ORDER — TIZANIDINE HCL 4 MG PO TABS
4.0000 mg | ORAL_TABLET | Freq: Three times a day (TID) | ORAL | Status: DC
  Administered 2018-04-17: 4 mg via ORAL
  Filled 2018-04-17 (×3): qty 1

## 2018-04-17 MED ORDER — SUCRALFATE 1 G PO TABS
1.0000 g | ORAL_TABLET | Freq: Four times a day (QID) | ORAL | Status: DC
  Administered 2018-04-17: 1 g via ORAL
  Filled 2018-04-17: qty 1

## 2018-04-17 MED ORDER — HYDROCODONE-ACETAMINOPHEN 5-325 MG PO TABS
2.0000 | ORAL_TABLET | Freq: Three times a day (TID) | ORAL | Status: DC | PRN
  Administered 2018-04-18 – 2018-04-19 (×3): 2 via ORAL
  Filled 2018-04-17 (×3): qty 2

## 2018-04-17 MED ORDER — HYDROCHLOROTHIAZIDE 25 MG PO TABS
25.0000 mg | ORAL_TABLET | Freq: Every day | ORAL | Status: DC
  Administered 2018-04-17: 25 mg via ORAL
  Filled 2018-04-17: qty 1

## 2018-04-17 NOTE — ED Notes (Signed)
BEHAVIORAL HEALTH ROUNDING Patient sleeping: Yes.   Patient alert and oriented: eyes closed  Appears to be asleep Behavior appropriate: Yes.  ; If no, describe:  Nutrition and fluids offered: Yes  Toileting and hygiene offered: sleeping Sitter present: q 15 minute observations and security monitoring Law enforcement present: yes   

## 2018-04-17 NOTE — BH Assessment (Signed)
Per Charge Nurse Gwen, patient can't come to unit until 8pm.

## 2018-04-17 NOTE — ED Notes (Signed)
BEHAVIORAL HEALTH ROUNDING Patient sleeping: No. Patient alert and oriented: yes Behavior appropriate: Yes.  ; If no, describe:  Nutrition and fluids offered: yes Toileting and hygiene offered: Yes  Sitter present: q15 minute observations and security monitoring Law enforcement present: Yes    

## 2018-04-17 NOTE — BH Assessment (Signed)
Patient is to be admitted to [facility] by Dr. Toni Amend.  Attending Physician will be Dr. Toni Amend.   Patient has been assigned to room 323, by [facility] Charge Nurse Gwen.   Intake Paper Work has been signed and placed on patient chart.  ER staff is aware of the admission:  Luann: ER Sectary   Dr. Alphonzo Lemmings: ER MD   WUX:[id number] Nurse   Mertie Clause: Patient Access.

## 2018-04-17 NOTE — ED Notes (Signed)
Jean Gomez is currently consulting with her  

## 2018-04-17 NOTE — ED Notes (Signed)
Pt. Alert and oriented, warm and dry, in no distress. Pt. Denies SI, HI, and AVH. Pt C-pap from home was set up in room with O2 attached with two liters. Pt. Encouraged to let nursing staff know of any concerns or needs.

## 2018-04-17 NOTE — ED Notes (Signed)
Patient observed lying in bed with eyes closed and her CPAP in use   Even, unlabored respirations observed   NAD pt appears to be sleeping  I will continue to monitor along with every 15 minute visual observations and ongoing monitoring

## 2018-04-17 NOTE — Progress Notes (Signed)
Patient is alert and oriented x 4 with some confusion. Patient is suspicious of everyone but herself. Very talkative and tangential. Patient warned this RN not to let anyone know she's admitted here. Patient also stated she uses 2L/[place] at all times and if she doesn't get it, she will develop chest pain. Respiratory Trey Paula) notified and he set up 02 at 2L/[place] with her CPAP with portable oxygen. Patient's medications sent to pharmacy. Requested for Zanaflex 4 mg oral PRN for muscle spasm. Will continue to monitor every 15 minutes.

## 2018-04-17 NOTE — ED Provider Notes (Signed)
-----------------------------------------   2:54 PM on 04/17/2018 -----------------------------------------  Patient has been evaluated by psychiatry, they are concerned about her, she was otherwise medically cleared prior to my arrival with the exception of letter light abnormalities which were addressed by the prior physician.  I am reassured that her potassium is much closer to the normal range and her magnesium has corrected.  We will continue supplementation, she is on CIWA protocol as there is some concern for alcohol abuse, and she will be dispositioned according to psychiatry. Signed out at the end of my shift.   Jeanmarie Plant, MD 04/17/18 1455

## 2018-04-17 NOTE — ED Notes (Signed)
Pt exhibiting paranoid behavior towards her daughter and her daughter boyfriend - stating to me   "I have some dressing from easter Sunday that I know he poisoned so I cut it up and put it in the freezer - I want my primary doctor to go get the dressing and have it tested for poison."

## 2018-04-17 NOTE — BH Assessment (Signed)
Assessment Note  Jean Gomez is an 62 y.o. female. Jean Gomez arrived to the ED by way of [facility] police department.  She reports that she called the [facility] police and stated that she needed to leave. She states that her daughter told her to get out.  She reports that she has been feeling depressed.  She states that her living situation is stressful.  She denied symptoms of anxiety.  She denied having auditory or visual hallucinations.  She denied suicidal or homicidal ideation or intent.  She reports that she has a Child psychotherapist - [facility].  She states that she sought their assistance to find housing.  She reports that family is mistreating her.  She denied the use of alcohol or drugs.   IVC paperwork reports, "Respondent is displaying irate behavior. Attempting to move out of daughter's house around midnight at 19 years of age.  Been diagnosed with depression and takes several medications including narcotics while also consuming alcohol. Previously under the care of a psychiatrist. Respondent also has clothing on backwards seemingly unaware."  IVC paperwork was taken out by the patients' daughter.  The patient stated that she did not want anyone to speak with her daughter.  Diagnosis: Depression  Past Medical History:  Past Medical History:  Diagnosis Date  . Asthma   . Cerebral aneurysm   . Chest pain 11/01/2016  . Chronic migraine   . Chronic respiratory failure (HCC)   . COPD (chronic obstructive pulmonary disease) (HCC)   . Hypertension   . Osteoarthritis   . Sarcoid   . Seizures (HCC)     Past Surgical History:  Procedure Laterality Date  . ABDOMINAL HYSTERECTOMY    . CHOLECYSTECTOMY    . SPINAL FUSION      Family History: No family history on file.  Social History:  reports that she has never smoked. She has never used smokeless tobacco. She reports that she drinks alcohol. She reports that she does not use drugs.  Additional Social History:   Alcohol / Drug Use History of alcohol / drug use?: No history of alcohol / drug abuse  CIWA: CIWA-Ar BP: (!) 162/89 Pulse Rate: (!) 117 COWS:    Allergies:  Allergies  Allergen Reactions  . Gabapentin   . Labetalol Other (See Comments)    Made hair fall out  . Sulfabenzamide Nausea Only  . Fentanyl Rash  . Penicillins Rash    Home Medications:  (Not in a hospital admission)  OB/GYN Status:  No LMP recorded. Patient is postmenopausal.  General Assessment Data Location of Assessment: [facility] ED TTS Assessment: In system Is this a Tele or Face-to-Face Assessment?: Face-to-Face Is this an Initial Assessment or a Re-assessment for this encounter?: Initial Assessment Marital status: Divorced Henriette name: Jean Gomez Is patient pregnant?: No Pregnancy Status: No Living Arrangements: Children Can pt return to current living arrangement?: Yes Admission Status: Involuntary Is patient capable of signing voluntary admission?: No Referral Source: Self/Family/Friend Insurance type: Medicare/Medicaid  Medical Screening Exam [facility] Walk-in ONLY) Medical Exam completed: Yes  Crisis Care Plan Living Arrangements: Children Legal Guardian: Other:(Self) Name of Psychiatrist: None Name of Therapist: None  Education Status Is patient currently in school?: No Is the patient employed, unemployed or receiving disability?: Unemployed  Risk to self with the past 6 months Suicidal Ideation: No Has patient been a risk to self within the past 6 months prior to admission? : No Suicidal Intent: No Has patient had any suicidal intent within the  past 6 months prior to admission? : No Is patient at risk for suicide?: No Suicidal Plan?: No Has patient had any suicidal plan within the past 6 months prior to admission? : No Access to Means: No What has been your use of drugs/alcohol within the last 12 months?: denied Previous Attempts/Gestures: No How many times?: 0 Other Self Harm Risks:  denied Triggers for Past Attempts: None known Intentional Self Injurious Behavior: None Family Suicide History: No Recent stressful life event(s): Conflict (Comment)(Family issues) Persecutory voices/beliefs?: No Depression: Yes Depression Symptoms: Fatigue Substance abuse history and/or treatment for substance abuse?: No Suicide prevention information given to non-admitted patients: Not applicable  Risk to Others within the past 6 months Homicidal Ideation: No Does patient have any lifetime risk of violence toward others beyond the six months prior to admission? : No Thoughts of Harm to Others: No Current Homicidal Intent: No Current Homicidal Plan: No Access to Homicidal Means: No Identified Victim: None identified History of harm to others?: No Assessment of Violence: None Noted Violent Behavior Description: denied Does patient have access to weapons?: No Criminal Charges Pending?: No Does patient have a court date: No Is patient on probation?: No  Psychosis Hallucinations: None noted Delusions: (UTA)  Mental Status Report Appearance/Hygiene: In scrubs Eye Contact: Good Motor Activity: Freedom of movement Speech: Tangential Level of Consciousness: Alert Mood: Pleasant Affect: Appropriate to circumstance Anxiety Level: None Thought Processes: Tangential Judgement: Partial Orientation: Appropriate for developmental age Obsessive Compulsive Thoughts/Behaviors: None  Cognitive Functioning Concentration: Fair Memory: Recent Intact Is patient IDD: No Is patient DD?: No Insight: Fair Impulse Control: Fair Appetite: Poor Have you had any weight changes? : No Change Sleep: Decreased Vegetative Symptoms: None  ADLScreening [facility] Assessment Services) Patient's cognitive ability adequate to safely complete daily activities?: Yes Patient able to express need for assistance with ADLs?: Yes Independently performs ADLs?: Yes (appropriate for developmental age)  Prior  Inpatient Therapy Prior Inpatient Therapy: No  Prior Outpatient Therapy Prior Outpatient Therapy: No Does patient have an ACCT team?: No Does patient have Intensive In-House Services?  : No Does patient have [facility] services? : No Does patient have P4CC services?: No  ADL Screening (condition at time of admission) Patient's cognitive ability adequate to safely complete daily activities?: Yes Is the patient deaf or have difficulty hearing?: No Does the patient have difficulty seeing, even when wearing glasses/contacts?: No Does the patient have difficulty concentrating, remembering, or making decisions?: No Patient able to express need for assistance with ADLs?: Yes Does the patient have difficulty dressing or bathing?: No Independently performs ADLs?: Yes (appropriate for developmental age) Weakness of Legs: Both Weakness of Arms/Hands: Both  Home Assistive Devices/Equipment Home Assistive Devices/Equipment: Cane (specify quad or straight), Brace (specify type), CPAP    Abuse/Neglect Assessment (Assessment to be complete while patient is alone) Abuse/Neglect Assessment Can Be Completed: (Patient denied history of abuse)                Disposition:  Disposition Initial Assessment Completed for this Encounter: Yes  On Site Evaluation by:   Reviewed with Physician:    Justice Deeds 04/17/2018 4:35 AM

## 2018-04-17 NOTE — ED Notes (Signed)
Pt requested no more phone calls, or information given out to any callers, with exception to primary care physicians

## 2018-04-17 NOTE — Progress Notes (Signed)
Body search and skin assessment performed.  No contraband found.  No broken areas noted to skin.

## 2018-04-17 NOTE — ED Provider Notes (Signed)
[facility] Emergency Department Provider Note  ____________________________________________   First MD Initiated Contact with Patient 04/17/18 [phone]     (approximate)  I have reviewed the triage vital signs and the nursing notes.   HISTORY  Chief Complaint Psychiatric Evaluation  Level 5 caveat:  history/ROS may limited by active psychosis / mental illness / altered mental status   HPI Jean Gomez is a 62 y.o. female with extensive chronic medical history as listed below who presents under involuntary commitment by her daughter in the custody of [facility] [employer].  Reportedly she has been acting bizarrely and aggressively at home and was trying to leave in the middle of the night.  She has been acting paranoid and her daughter also expresses a concern due to the patient's alcohol use and the fact that she takes narcotic medication chronically prescribed by a chronic pain doctor.  The patient is ambulatory with a cane and very conversant.  She is a bit tangential but complains that she is not being adequately cared for by her daughter and her daughter's boyfriend.  She seems to be perseverating about how her phone is being monitored and they are saying things that are not true.  It is difficult to get any additional history from her but she is not complaining of any acute medical issues except for chronic pain primarily in her back which is chronic.  Past Medical History:  Diagnosis Date  . Asthma   . Cerebral aneurysm   . Chest pain 11/01/2016  . Chronic migraine   . Chronic respiratory failure (HCC)   . COPD (chronic obstructive pulmonary disease) (HCC)   . Hypertension   . Osteoarthritis   . Sarcoid   . Seizures [facility])     Patient Active Problem List   Diagnosis Date Noted  . Chronic hip pain [facility] Area of Pain) (Bilateral) (R>L) 02/26/2018  . DDD (degenerative disc disease), cervical 02/26/2018  . Cervical facet arthropathy  (Bilateral) 02/26/2018  . Cervical facet syndrome (Bilateral) (R>L) 02/26/2018  . Cervical Grade 1 Anterolisthesis of C4 over C5 (Degenerative) 02/26/2018  . Cervical Grade 1 Retrolisthesis of C5 over C6 02/26/2018  . Cervical foraminal stenosis (C3-4, C4-5, and C5-6) (Bilateral) 02/26/2018  . DDD (degenerative disc disease), lumbar 02/26/2018  . Osteoarthritis of lumbar spine 02/26/2018  . Osteoarthritis of facet joint of lumbar spine 02/26/2018  . Spondylosis without myelopathy or radiculopathy, cervicothoracic region 02/26/2018  . Occipital headache (Right) 02/25/2018  . Frontal headache (Left) 02/25/2018  . Chronic low back pain (Midline) (Secondary Area of Pain) 02/25/2018  . Chronic lumbar radiculopathy (Right) 02/25/2018  . Osteoarthritis involving multiple joints 02/25/2018  . Osteoarthritis of shoulder (Bilateral) 02/25/2018  . Chronic musculoskeletal pain 02/25/2018  . Lumbar facet joint syndrome (Bilateral) (R>L) 02/25/2018  . Vitamin D deficiency 02/14/2018  . Chronic pain syndrome 02/07/2018  . Pharmacologic therapy 02/07/2018  . Disorder of skeletal system 02/07/2018  . Problems influencing health status 02/07/2018  . Long term current use of opiate analgesic 02/07/2018  . Chronic low back pain (Bilateral) with right-sided sciatica (Secondary Area of Pain) (R>L) 02/07/2018  . Chronic lower extremity pain [facility] Area of Pain) (Bilateral) (R>L) 02/07/2018  . Chronic knee pain (Fourth Area of Pain) (Bilateral) (R>L) 02/07/2018  . Chronic shoulder pain (Fifth Area of Pain) (Bilateral) (R>L) 02/07/2018  . Occipital neuralgia (Primary Area of Pain) (Bilateral) (R>L) 02/07/2018  . Chronic neck pain (Bilateral) (R>L) 02/07/2018  . Chronic sacroiliac joint pain (Right) 02/07/2018  .  H/O aneurysm 01/30/2018  . History of depression 01/30/2018  . History of seizure disorder 01/30/2018  . Chronic headache disorder (Primary Area of Pain) 01/30/2018  . Chronic migraine without  aura 01/17/2018  . Chronic respiratory failure with hypoxia (HCC) 01/17/2018  . Constipation 01/17/2018  . HTN, goal below 140/90 01/17/2018  . Hypercholesterolemia 01/17/2018  . Major depression in remission (HCC) 01/17/2018  . COPD (chronic obstructive pulmonary disease) (HCC) 01/17/2018  . Sarcoidosis 01/17/2018  . Seizure disorder (HCC) 01/17/2018  . Nonruptured cerebral aneurysm 10/22/2017  . Obstructive sleep apnea on CPAP 09/11/2017  . Generalized anxiety disorder 01/26/2017  . Irritable bowel syndrome 01/26/2017  . Pulmonary emphysema (HCC) 01/26/2017  . GERD (gastroesophageal reflux disease) 08/02/2016  . Hiatal hernia 08/02/2016  . Essential tremor 07/19/2016  . Muscle spasm 10/05/2015  . Spondylosis of lumbar region without myelopathy or radiculopathy 10/05/2015  . Rotator cuff injury 09/17/2015  . Chronic shoulder pain (Right) 09/17/2015  . Epilepsy (HCC) 07/14/2015  . Insomnia 07/14/2015  . Osteoarthritis of knee (Bilateral) (R>L) 06/24/2015    Past Surgical History:  Procedure Laterality Date  . ABDOMINAL HYSTERECTOMY    . CHOLECYSTECTOMY    . SPINAL FUSION      Prior to Admission medications   Medication Sig Start Date End Date Taking? Authorizing Provider  albuterol (PROVENTIL) (2.5 MG/3ML) 0.083% nebulizer solution Take 2.5 mg by nebulization every 4 (four) hours as needed for wheezing or shortness of breath.    [provider]  Albuterol Sulfate 108 (90 Base) MCG/ACT AEPB Inhale 2 puffs into the lungs every 6 (six) hours as needed.    [provider]  amLODipine (NORVASC) 10 MG tablet Take 10 mg by mouth daily.    [provider]  aspirin EC 81 MG tablet Take 81 mg by mouth daily.    [provider]  atorvastatin (LIPITOR) 10 MG tablet Take 10 mg by mouth daily.    [provider]  betamethasone, augmented, (DIPROLENE) 0.05 % lotion Apply topically 2 (two) times daily.    [provider]    budesonide-formoterol (SYMBICORT) 160-4.5 MCG/ACT inhaler Inhale 2 puffs into the lungs 2 (two) times daily.    [provider]  clotrimazole-betamethasone (LOTRISONE) cream Apply 1 application topically 2 (two) times daily.    [provider]  diclofenac sodium (VOLTAREN) 1 % GEL Apply 2 g topically 4 (four) times daily.    [provider]  ergocalciferol (VITAMIN D2) 50000 units capsule Take 1 capsule (50,000 Units total) by mouth 2 (two) times a week. X 6 weeks. Take with 600 mg of OTC Calcium 02/25/18 04/08/18  Delano Metz, MD  escitalopram (LEXAPRO) 20 MG tablet Take 20 mg by mouth daily.    [provider]  hydrochlorothiazide (HYDRODIURIL) 25 MG tablet Take 25 mg by mouth daily.    [provider]  HYDROcodone-acetaminophen (NORCO) 10-325 MG tablet Take 1 tablet by mouth every 8 (eight) hours as needed for severe pain. 03/27/18 04/26/18  Barbette Merino, NP  ipratropium (ATROVENT) 0.02 % nebulizer solution Take 0.5 mg by nebulization every 4 (four) hours as needed for wheezing or shortness of breath.    [provider]  ketoconazole (NIZORAL) 2 % shampoo Apply 1 application topically 2 (two) times a week.    [provider]  lactulose (CHRONULAC) 10 GM/15ML solution TK 15 ML PO BID PRN 03/12/18   [provider]  levETIRAcetam (KEPPRA) 750 MG tablet Take 750 mg by mouth daily.  [provider]  losartan (COZAAR) 25 MG tablet Take 25 mg by mouth daily.    [provider]  mirtazapine (REMERON) 15 MG tablet Take 15 mg by mouth at bedtime.    [provider]  nitroGLYCERIN (NITROSTAT) 0.4 MG SL tablet Place 0.4 mg under the tongue every 5 (five) minutes as needed for chest pain.    [provider]  ondansetron (ZOFRAN) 4 MG tablet  03/20/18   [provider]  pantoprazole (PROTONIX) 40 MG tablet Take 40 mg by mouth daily.    [provider]  potassium chloride SA  (K-DUR,KLOR-CON) 20 MEQ tablet Take 20 mEq by mouth 2 (two) times daily.    [provider]  sucralfate (CARAFATE) 1 g tablet Take 1 g by mouth 4 (four) times daily.    [provider]  tiZANidine (ZANAFLEX) 4 MG tablet Take 1 tablet (4 mg total) by mouth 3 (three) times daily. 03/27/18 04/26/18  Barbette Merino, NP  torsemide (DEMADEX) 5 MG tablet  03/14/18   [provider]  venlafaxine (EFFEXOR) 75 MG tablet Take 75 mg by mouth daily.    [provider]    Allergies Gabapentin; Labetalol; Sulfabenzamide; Fentanyl; and Penicillins  No family history on file.  Social History Social History   Tobacco Use  . Smoking status: Never Smoker  . Smokeless tobacco: Never Used  Substance Use Topics  . Alcohol use: Yes    Comment: occasional  . Drug use: No    Review of Systems Level 5 caveat:  history/ROS may limited by active psychosis / mental illness / altered mental status  Constitutional: No fever/chills Eyes: No visual changes. ENT: No sore throat. Cardiovascular: Denies chest pain. Respiratory: Denies shortness of breath. Gastrointestinal: No abdominal pain.  No nausea, no vomiting.  No diarrhea.  No constipation. Genitourinary: Negative for dysuria. Musculoskeletal: Chronic back and joint pain. Integumentary: Negative for rash. Neurological: Negative for headaches, focal weakness or numbness. Psychiatric:Reportedly with agitated behavior and paranoia  ____________________________________________   PHYSICAL EXAM:  VITAL SIGNS: ED Triage Vitals  Enc Vitals Group     BP 04/17/18 0224 (!) 162/89     Pulse Rate 04/17/18 0224 (!) 117     Resp 04/17/18 0224 16     Temp 04/17/18 0224 99.3 F (37.4 C)     Temp Source 04/17/18 0224 Oral     SpO2 04/17/18 0224 97 %     Weight --      Height --      Head Circumference --      Peak Flow --      Pain Score 04/17/18 0225 8     Pain Loc --      Pain Edu? --      Excl. in GC? --      Constitutional: Alert and oriented. Well appearing and in no acute distress. Eyes: Conjunctivae are normal.  Head: Atraumatic. Nose: No congestion/rhinnorhea. Mouth/Throat: Mucous membranes are moist. Neck: No stridor.  No meningeal signs.   Cardiovascular: Normal rate, regular rhythm. Good peripheral circulation. Grossly normal heart sounds. Respiratory: Normal respiratory effort.  No retractions. Lungs CTAB. Gastrointestinal: Soft and nontender. No distention.  Musculoskeletal: No lower extremity tenderness nor edema. No gross deformities of extremities. Neurologic:  Normal speech and language. No gross focal neurologic deficits are appreciated.  Skin:  Skin is warm, dry and intact. No rash noted. Psychiatric: Mood and affect seem somewhat paranoid and her conversation is tangential.  she seems to be  perseverating on how her daughter and her boyfriend are monitoring her phone and blocking calls from other people.  She denies SI/HI. ____________________________________________   LABS (all labs ordered are listed, but only abnormal results are displayed)  Labs Reviewed  COMPREHENSIVE METABOLIC PANEL - Abnormal; Notable for the following components:      Result Value   Potassium 2.6 (*)    Chloride 96 (*)    Glucose, Bld 146 (*)    Total Protein 8.6 (*)    All other components within normal limits  ACETAMINOPHEN LEVEL - Abnormal; Notable for the following components:   Acetaminophen (Tylenol), Serum <10 (*)    All other components within normal limits  CBC - Abnormal; Notable for the following components:   Hemoglobin 10.8 (*)    HCT 33.1 (*)    MCV 75.3 (*)    MCH 24.6 (*)    RDW 18.8 (*)    All other components within normal limits  URINE DRUG SCREEN, QUALITATIVE ([facility] ONLY) - Abnormal; Notable for the following components:   Tricyclic, Ur Screen POSITIVE (*)    Opiate, Ur Screen POSITIVE (*)    All other components within normal limits  MAGNESIUM - Abnormal; Notable  for the following components:   Magnesium 1.3 (*)    All other components within normal limits  ETHANOL  SALICYLATE LEVEL   ____________________________________________  EKG  ED ECG REPORT I, Loleta Rose, the attending physician, personally viewed and interpreted this ECG.  Date: 04/17/2018 EKG Time: 04:37 Rate: 103 Rhythm: mild sinus tachycardia QRS Axis: normal Intervals: normal ST/T Wave abnormalities: Non-specific ST segment / T-wave changes, but no evidence of acute ischemia. Narrative Interpretation: no evidence of acute ischemia   ____________________________________________  RADIOLOGY   ED MD interpretation: No indication for imaging  Official radiology report(s): No results found.  ____________________________________________   PROCEDURES  Critical Care performed: No   Procedure(s) performed:   Procedures   ____________________________________________   INITIAL IMPRESSION / ASSESSMENT AND PLAN / ED COURSE  As part of my medical decision making, I reviewed the following data within the electronic MEDICAL RECORD NUMBER Nursing notes reviewed and incorporated, Labs reviewed , EKG interpreted  and Patient signed out to Dr. Don Perking.    Differential diagnosis includes, but is not limited to, substance-induced mood disorder, bipolar disorder, schizophrenia, adjustment disorder.  The patient does seem paranoid right now but it is unclear what is actually happening at her home.  Her tangential speech patterns and habits may simply be her baseline or could represent an acute mental health issue, it is hard to tell, and given that she came in under involuntary commitment I will uphold the commitment at this time until she can be evaluated by psychiatry.  I reviewed her medication list and home meds including a CPAP.  I see that she is on a potassium supplement but her lab work is notable for a significantly low potassium at 2.6.  I have ordered 40 mEq of potassium  by mouth twice daily starting now (approximately 3:45 AM).  I have ordered an add on magnesium level as well since this is likely low to.  She is having no medical symptoms at this time other than her chronic back pain.  I see no indication for admission based solely upon the low potassium currently as this can be repleted as an outpatient.  She is under involuntary commitment currently and TTS and psych consult orders have been placed.  Clinical Course as of Apr 17 725  Wed Apr 17, 2018  0408 In addition to the hypokalemia, the patient also has hypomagnesemia.  I have ordered magnesium oxide 400 twice daily including first dose now.  I also ordered repeat lab work to be drawn on Thursday morning at 5:00 AM, for a metabolic panel as well as a magnesium level.   [CF]    Clinical Course User Index [CF] Loleta Rose, MD    ____________________________________________  FINAL CLINICAL IMPRESSION(S) / ED DIAGNOSES  Final diagnoses:  Aggressive behavior  Hypokalemia  Hypomagnesemia     MEDICATIONS GIVEN DURING THIS VISIT:  Medications  albuterol (PROVENTIL) (2.5 MG/3ML) 0.083% nebulizer solution 2.5 mg (has no administration in time range)  amLODipine (NORVASC) tablet 10 mg (has no administration in time range)  aspirin EC tablet 81 mg (has no administration in time range)  atorvastatin (LIPITOR) tablet 10 mg (has no administration in time range)  betamethasone (augmented) (DIPROLENE) 0.05 % lotion (has no administration in time range)  mometasone-formoterol (DULERA) 200-5 MCG/ACT inhaler 2 puff (has no administration in time range)  clotrimazole (LOTRIMIN) 1 % cream (has no administration in time range)  diclofenac sodium (VOLTAREN) 1 % transdermal gel 2 g (has no administration in time range)  escitalopram (LEXAPRO) tablet 20 mg (has no administration in time range)  hydrochlorothiazide (HYDRODIURIL) tablet 25 mg (has no administration in time range)  HYDROcodone-acetaminophen (NORCO)  10-325 MG per tablet 1 tablet (1 tablet Oral Given 04/17/18 0442)  ipratropium (ATROVENT) nebulizer solution 0.5 mg (has no administration in time range)  levETIRAcetam (KEPPRA) tablet 750 mg (has no administration in time range)  losartan (COZAAR) tablet 25 mg (has no administration in time range)  mirtazapine (REMERON) tablet 15 mg (has no administration in time range)  pantoprazole (PROTONIX) EC tablet 40 mg (has no administration in time range)  potassium chloride SA (K-DUR,KLOR-CON) CR tablet 40 mEq (40 mEq Oral Not Given 04/17/18 0503)  sucralfate (CARAFATE) tablet 1 g (has no administration in time range)  tiZANidine (ZANAFLEX) tablet 4 mg (has no administration in time range)  torsemide (DEMADEX) tablet 5 mg (has no administration in time range)  venlafaxine (EFFEXOR) tablet 75 mg (has no administration in time range)  magnesium oxide (MAG-OX) tablet 400 mg (400 mg Oral Given 04/17/18 0441)  LORazepam (ATIVAN) injection 0-4 mg (has no administration in time range)    Or  LORazepam (ATIVAN) tablet 0-4 mg (has no administration in time range)  LORazepam (ATIVAN) injection 0-4 mg (has no administration in time range)    Or  LORazepam (ATIVAN) tablet 0-4 mg (has no administration in time range)  thiamine (VITAMIN B-1) tablet 100 mg (has no administration in time range)    Or  thiamine (B-1) injection 100 mg (has no administration in time range)  tiZANidine (ZANAFLEX) tablet 4 mg (4 mg Oral Given 04/17/18 0442)     ED Discharge Orders    None       Note:  This document was prepared using Dragon voice recognition software and may include unintentional dictation errors.    Loleta Rose, MD 04/17/18 [phone]

## 2018-04-17 NOTE — Progress Notes (Signed)
Patient ID: Jean Gomez, female   DOB: 1956-12-18, 62 y.o.   MRN: [id number] Per State regulations 482.30 this chart was reviewed for medical necessity with respect to the patient's admission/duration of stay.    Next review date: 04/21/18  Thurman Coyer, BSN, RN-BC  Case Manager

## 2018-04-17 NOTE — ED Notes (Signed)
PT BROUGHT IN BY BPD UNDER IVC/MD FORBACH, RN KIM AND ODS SECURITY MADE AWARE.

## 2018-04-17 NOTE — ED Notes (Signed)
Key number [id number] sent with her chart to [facility]  Pt has valuable in the safe  Jean So RN aware

## 2018-04-17 NOTE — ED Notes (Signed)
Patient had $2400 dollars and a coin on a key ring. Money and key ring placed in Patient valuables Envelope. Officer Mc[employer]o placed in locker 9. Key in pixis.

## 2018-04-17 NOTE — BH Assessment (Signed)
Per Dr. Clapac's patient meets criteria for inpatient psychiatric treatment.  

## 2018-04-17 NOTE — ED Notes (Signed)
PT IVC PENDING PSYCH CONSULT. 

## 2018-04-17 NOTE — ED Notes (Signed)
ED BHU PLACEMENT JUSTIFICATION Is the patient under IVC or is there intent for IVC: Yes.   Is the patient medically cleared: Yes.   Is there vacancy in the ED BHU: Yes.   Is the population mix appropriate for patient: Yes.   Is the patient awaiting placement in inpatient or outpatient setting: Yes.   Has the patient had a psychiatric consult: Yes.   Survey of unit performed for contraband, proper placement and condition of furniture, tampering with fixtures in bathroom, shower, and each patient room: Yes.  ; Findings:  APPEARANCE/BEHAVIOR Calm and cooperative NEURO ASSESSMENT Orientation: oriented x3  Denies pain Hallucinations: No.None noted (Hallucinations) Speech: Normal Gait: normal RESPIRATORY ASSESSMENT Even  Unlabored respirations  CARDIOVASCULAR ASSESSMENT Pulses equal   regular rate  Skin warm and dry   GASTROINTESTINAL ASSESSMENT no GI complaint EXTREMITIES Full ROM  PLAN OF CARE Provide calm/safe environment. Vital signs assessed twice daily. ED BHU Assessment once each 12-hour shift. Collaborate with TTS  daily or as condition indicates. Assure the ED provider has rounded once each shift. Provide and encourage hygiene. Provide redirection as needed. Assess for escalating behavior; address immediately and inform ED provider.  Assess family dynamic and appropriateness for visitation as needed: Yes.  ; If necessary, describe findings:  Educate the patient/family about BHU procedures/visitation: Yes.  ; If necessary, describe findings:   

## 2018-04-17 NOTE — Tx Team (Signed)
PsychosisInitial Treatment Plan 04/17/2018 10:56 PM Daphnie Manson Passey JYN:[id number]    PATIENT STRESSORS: Health problems Traumatic event   PATIENT STRENGTHS: Ability for insight Average or above average intelligence Communication skills General fund of knowledge Physical Health   PATIENT IDENTIFIED PROBLEMS:   Psychosis  Anxiety  Problems with her daughter               DISCHARGE CRITERIA:  Ability to meet basic life and health needs Adequate post-discharge living arrangements Improved stabilization in mood, thinking, and/or behavior Medical problems require only outpatient monitoring Motivation to continue treatment in a less acute level of care Need for constant or close observation no longer present  PRELIMINARY DISCHARGE PLAN: Attend aftercare/continuing care group Outpatient therapy Placement in alternative living arrangements  PATIENT/FAMILY INVOLVEMENT: This treatment plan has been presented to and reviewed with the patient, Lavera Vandermeer, and/or family member, The patient and family have been given the opportunity to ask questions and make suggestions.  Crisoforo Oxford, RN 04/17/2018, 10:56 PM

## 2018-04-17 NOTE — Consult Note (Signed)
[place] [facility] Consult   Reason for Consult: Consult for 62 year old woman brought in under petition initiated by her daughter for bizarre behavior Referring Physician: Burlene Arnt Patient Identification: Jean Gomez MRN:  [id number] Principal Diagnosis: Bipolar affective disorder, current episode manic [facility]) Diagnosis:   Patient Active Problem List   Diagnosis Date Noted  . Bipolar affective disorder, current episode manic ([place]) [F31.9] 04/17/2018  . Chronic hip pain [facility] Area of Pain) (Bilateral) (R>L) [M25.551, M25.552, G89.29] 02/26/2018  . DDD (degenerative disc disease), cervical [M50.30] 02/26/2018  . Cervical facet arthropathy (Bilateral) [D62.229] 02/26/2018  . Cervical facet syndrome (Bilateral) (R>L) [N98.921] 02/26/2018  . Cervical Grade 1 Anterolisthesis of C4 over C5 (Degenerative) [M43.10] 02/26/2018  . Cervical Grade 1 Retrolisthesis of C5 over C6 [M43.10] 02/26/2018  . Cervical foraminal stenosis (C3-4, C4-5, and C5-6) (Bilateral) [M99.81] 02/26/2018  . DDD (degenerative disc disease), lumbar [M51.36] 02/26/2018  . 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 Osteoarthritis of facet joint of lumbar spine [M47.816] 02/26/2018  . Spondylosis without myelopathy or radiculopathy, cervicothoracic region [M47.813] 02/26/2018  . Occipital headache (Right) [R51] 02/25/2018  . Frontal headache (Left) [R51] 02/25/2018  . Chronic low back pain (Midline) (Secondary Area of Pain) [M54.41, G89.29] 02/25/2018  . Chronic lumbar radiculopathy (Right) [M54.16] 02/25/2018  . Osteoarthritis involving multiple joints [M15.0] 02/25/2018  . 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 Chronic musculoskeletal pain [M79.18, G89.29] 02/25/2018  . Lumbar facet joint syndrome (Bilateral) (R>L) [M47.816] 02/25/2018  . Vitamin D deficiency [E55.9] 02/14/2018  . Chronic pain syndrome [G89.4] 02/07/2018  . Pharmacologic therapy [Z79.899]  02/07/2018  . Disorder of skeletal system [M89.9] 02/07/2018  . Problems influencing health status [Z78.9] 02/07/2018  . Long term current use of opiate analgesic [Z79.891] 02/07/2018  . Chronic low back pain (Bilateral) with right-sided sciatica (Secondary Area of Pain) (R>L) [M54.41, G89.29] 02/07/2018  . Chronic lower extremity pain [facility] Area of Pain) (Bilateral) (R>L) [id number], M79.605, G89.29] 02/07/2018  . Chronic knee pain (Fourth Area of Pain) (Bilateral) (R>L) [M25.561, M25.562, G89.29] 02/07/2018  . Chronic shoulder pain (Fifth Area of Pain) (Bilateral) (R>L) [M25.511, G89.29, M25.512] 02/07/2018  . Occipital neuralgia (Primary Area of Pain) (Bilateral) (R>L) [M54.81] 02/07/2018  . Chronic neck pain (Bilateral) (R>L) [M54.2, G89.29] 02/07/2018  . Chronic sacroiliac joint pain (Right) [M53.3, G89.29] 02/07/2018  . H/O aneurysm [Z86.79] 01/30/2018  . History of depression [Z86.59] 01/30/2018  . History of seizure disorder [Z86.69] 01/30/2018  . Chronic headache disorder (Primary Area of Pain) [R51] 01/30/2018  . Chronic migraine without aura [G43.709] 01/17/2018  . Chronic respiratory failure with hypoxia ([place]) [J96.11] 01/17/2018  . Constipation [K59.00] 01/17/2018  . HTN, goal below 140/90 [I10] 01/17/2018  . Hypercholesterolemia [E78.00] 01/17/2018  . Major depression in remission ([place]) [F32.5] 01/17/2018  . COPD (chronic obstructive pulmonary disease) ([place]) [J44.9] 01/17/2018  . Sarcoidosis [D86.9] 01/17/2018  . Seizure disorder ([place]) [J94.174] 01/17/2018  . Nonruptured cerebral aneurysm [I67.1] 10/22/2017  . Obstructive sleep apnea on CPAP [G47.33, Z99.89] 09/11/2017  . Generalized anxiety disorder [F41.1] 01/26/2017  . Irritable bowel syndrome [K58.9] 01/26/2017  . Pulmonary emphysema ([place]) [J43.9] 01/26/2017  . GERD (gastroesophageal reflux disease) [K21.9] 08/02/2016  . Hiatal hernia [K44.9] 08/02/2016  . Essential tremor [G25.0] 07/19/2016  . Muscle spasm  [M62.838] 10/05/2015  . Spondylosis of lumbar region without myelopathy or radiculopathy [M47.816] 10/05/2015  . Rotator cuff injury [S46.009A] 09/17/2015  . Chronic shoulder pain (Right) [M25.511, G89.29] 09/17/2015  . Epilepsy ([place]) [Y81.448] 07/14/2015  . Insomnia [G47.00]  07/14/2015  . Osteoarthritis of knee (Bilateral) (R>L) [M17.0] 06/24/2015    Total Time spent with patient: 1 hour  Subjective:   Jean Gomez is a 62 y.o. female patient admitted with "it was all her fault".  HPI: Patient seen chart reviewed.  Also reviewed multiple old notes including from her home community in [place].  62 year old woman brought here under petition papers initiated by her daughter.  The petition papers alleged that the patient has been having bizarre behavior, has been displaying paranoia, that she tried to leave the daughter's house inappropriately late at night without a clear objective and that she has been showing bizarre behavior such as wearing her clothing backwards.  The patient gives a rambling and disorganized history that implies a belief that her daughter and her son-in-law have been spying on her.  She suggests that perhaps they were trying to poison her.  She says that she was planning to leave there.  Her description of where she was going to go is a little disorganized.  Patient denies suicidal or homicidal thoughts.  In parts of her story she says things that suggest very much that she has been having auditory hallucinations but she does not have insight into it.  Patient has not been according to her abusing drugs although the petition reports that she had been abusing alcohol.  There is no alcohol in the blood and the patient says she just had a small drink of wine a couple days ago.  Social history: Patient moved from [place] to [place] in November.  Came up here to stay with her daughter.  This appears to be the first time that her behavior has drawn attention  and that seemed inappropriate.  Patient also gives rambling information about other family members and other parts of the country but it is a little hard to follow.  Substance abuse history: Patient insists that she only had a tiny amount of wine a few days ago for her birthday and does not drink regularly.  Denies drug abuse.  Denies misuse of her medicines for pain.  Medical history: Patient has multiple medical problems.  She goes to the pain clinic and is treated for chronic pain problems.  She has hypertension, COPD, possible seizure disorder although it seems a little vaguely described.  Most of the problems listed however are related to arthritic pain.  Past Psychiatric History: Patient indicates that she saw a psychiatrist in [place] and was diagnosed with depression.  She cannot remember any names of any medicine she took other than Xanax but does not think she is probably taking anything for mental health conditions now.  He denies ever being told she had bipolar disorder denies any psychiatric hospitalization denies any suicidal or violent behavior.  Risk to Self: Suicidal Ideation: No Suicidal Intent: No Is patient at risk for suicide?: No Suicidal Plan?: No Access to Means: No What has been your use of drugs/alcohol within the last 12 months?: denied How many times?: 0 Other Self Harm Risks: denied Triggers for Past Attempts: None known Intentional Self Injurious Behavior: None Risk to Others: Homicidal Ideation: No Thoughts of Harm to Others: No Current Homicidal Intent: No Current Homicidal Plan: No Access to Homicidal Means: No Identified Victim: None identified History of harm to others?: No Assessment of Violence: None Noted Violent Behavior Description: denied Does patient have access to weapons?: No Criminal Charges Pending?: No Does patient have a court date: No Prior Inpatient Therapy: Prior Inpatient Therapy: No  Prior Outpatient Therapy: Prior Outpatient  Therapy: No Does patient have an ACCT team?: No Does patient have Intensive In-House Services?  : No Does patient have [facility] services? : No Does patient have P4CC services?: No  Past Medical History:  Past Medical History:  Diagnosis Date  . Asthma   . Cerebral aneurysm   . Chest pain 11/01/2016  . Chronic migraine   . Chronic respiratory failure ([place])   . COPD (chronic obstructive pulmonary disease) ([place])   . Hypertension   . Osteoarthritis   . Sarcoid   . Seizures ([place])     Past Surgical History:  Procedure Laterality Date  . ABDOMINAL HYSTERECTOMY    . CHOLECYSTECTOMY    . SPINAL FUSION     Family History: No family history on file. Family Psychiatric  History: She had a son with substance abuse problems Social History:  Social History   Substance and Sexual Activity  Alcohol Use Yes   Comment: occasional     Social History   Substance and Sexual Activity  Drug Use No    Social History   Socioeconomic History  . Marital status: Single    Spouse name: Not on file  . Number of children: Not on file  . Years of education: Not on file  . Highest education level: Not on file  Occupational History  . Not on file  Social Needs  . Financial resource strain: Not very hard  . Food insecurity:    Worry: Sometimes true    Inability: Sometimes true  . Transportation needs:    Medical: Yes    Non-medical: Yes  Tobacco Use  . Smoking status: Never Smoker  . Smokeless tobacco: Never Used  Substance and Sexual Activity  . Alcohol use: Yes    Comment: occasional  . Drug use: No  . Sexual activity: Not on file  Lifestyle  . Physical activity:    Days per week: Patient refused    Minutes per session: Patient refused  . Stress: Only a little  Relationships  . Social connections:    Talks on phone: Patient refused    Gets together: Patient refused    Attends religious service: Patient refused    Active member of club or organization: Patient refused     Attends meetings of clubs or organizations: Patient refused    Relationship status: Patient refused  Other Topics Concern  . Not on file  Social History Narrative  . Not on file   Additional Social History:    Allergies:   Allergies  Allergen Reactions  . Gabapentin   . Labetalol Other (See Comments)    Made hair fall out  . Sulfabenzamide Nausea Only  . Fentanyl Rash  . Penicillins Rash    Labs:  Results for orders placed or performed during the hospital encounter of 04/17/18 (from the past 48 hour(s))  Comprehensive metabolic panel     Status: Abnormal   Collection Time: 04/17/18  2:36 AM  Result Value Ref Range   Sodium 138 135 - 145 mmol/L   Potassium 2.6 (LL) 3.5 - 5.1 mmol/L    Comment: CRITICAL RESULT CALLED TO, READ BACK BY AND VERIFIED WITH KIMBERLY SUMMERS AT 0311 ON 04/17/18 RWW    Chloride 96 (L) 101 - 111 mmol/L   CO2 31 22 - 32 mmol/L   Glucose, Bld 146 (H) 65 - 99 mg/dL   BUN 10 6 - 20 mg/dL   Creatinine, Ser 0.73 0.44 - 1.00 mg/dL  Calcium 9.9 8.9 - 10.3 mg/dL   Total Protein 8.6 (H) 6.5 - 8.1 g/dL   Albumin 4.9 3.5 - 5.0 g/dL   AST 31 15 - 41 U/L   ALT 22 14 - 54 U/L   Alkaline Phosphatase 65 38 - 126 U/L   Total Bilirubin 0.3 0.3 - 1.2 mg/dL   GFR calc non Af Amer >60 >60 mL/min   GFR calc Af Amer >60 >60 mL/min    Comment: (NOTE) The eGFR has been calculated using the CKD EPI equation. This calculation has not been validated in all clinical situations. eGFR's persistently <60 mL/min signify possible Chronic Kidney Disease.    Anion gap 11 5 - 15    Comment: Performed at [facility], [address] [place], [place] [address]  Ethanol     Status: None   Collection Time: 04/17/18  2:36 AM  Result Value Ref Range   Alcohol, Ethyl (B) <10 <10 mg/dL    Comment:        LOWEST DETECTABLE LIMIT FOR SERUM ALCOHOL IS 10 mg/dL FOR MEDICAL PURPOSES ONLY Performed at [facility], [address] [place], [place] [address]    Salicylate level     Status: None   Collection Time: 04/17/18  2:36 AM  Result Value Ref Range   Salicylate Lvl <5.7 2.8 - 30.0 mg/dL    Comment: Performed at [facility], [address] [place], [place] [address]  Acetaminophen level     Status: Abnormal   Collection Time: 04/17/18  2:36 AM  Result Value Ref Range   Acetaminophen (Tylenol), Serum <10 (L) 10 - 30 ug/mL    Comment:        THERAPEUTIC CONCENTRATIONS VARY SIGNIFICANTLY. A RANGE OF 10-30 ug/mL MAY BE AN EFFECTIVE CONCENTRATION FOR MANY PATIENTS. HOWEVER, SOME ARE BEST TREATED AT CONCENTRATIONS OUTSIDE THIS RANGE. ACETAMINOPHEN CONCENTRATIONS >150 ug/mL AT 4 HOURS AFTER INGESTION AND >50 ug/mL AT 12 HOURS AFTER INGESTION ARE OFTEN ASSOCIATED WITH TOXIC REACTIONS. Performed at [facility], [address] [place], [place] [address]   cbc     Status: Abnormal   Collection Time: 04/17/18  2:36 AM  Result Value Ref Range   WBC 8.1 3.6 - 11.0 K/uL   RBC 4.40 3.80 - 5.20 MIL/uL   Hemoglobin 10.8 (L) 12.0 - 16.0 g/dL   HCT 33.1 (L) 35.0 - 47.0 %   MCV 75.3 (L) 80.0 - 100.0 fL   MCH 24.6 (L) 26.0 - 34.0 pg   MCHC 32.7 32.0 - 36.0 g/dL   RDW 18.8 (H) 11.5 - 14.5 %   Platelets 423 150 - 440 K/uL    Comment: Performed at [facility], [address] [place], [place] [address]  Urine Drug Screen, Qualitative     Status: Abnormal   Collection Time: 04/17/18  2:36 AM  Result Value Ref Range   Tricyclic, Ur Screen POSITIVE (A) NONE DETECTED   Amphetamines, Ur Screen NONE DETECTED NONE DETECTED   MDMA (Ecstasy)Ur Screen NONE DETECTED NONE DETECTED   Cocaine Metabolite,Ur [place] NONE DETECTED NONE DETECTED   Opiate, Ur Screen POSITIVE (A) NONE DETECTED   Phencyclidine (PCP) Ur S NONE DETECTED NONE DETECTED   Cannabinoid 50 Ng, Ur [place] NONE DETECTED NONE DETECTED   Barbiturates, Ur Screen NONE DETECTED NONE DETECTED   Benzodiazepine, Ur Scrn NONE DETECTED NONE DETECTED   Methadone Scn, Ur NONE  DETECTED NONE DETECTED    Comment: (NOTE) Tricyclics + metabolites, urine  Cutoff 1000 ng/mL Amphetamines + metabolites, urine  Cutoff 1000 ng/mL MDMA (Ecstasy), urine              Cutoff 500 ng/mL Cocaine Metabolite, urine          Cutoff 300 ng/mL Opiate + metabolites, urine        Cutoff 300 ng/mL Phencyclidine (PCP), urine         Cutoff 25 ng/mL Cannabinoid, urine                 Cutoff 50 ng/mL Barbiturates + metabolites, urine  Cutoff 200 ng/mL Benzodiazepine, urine              Cutoff 200 ng/mL Methadone, urine                   Cutoff 300 ng/mL The urine drug screen provides only a preliminary, unconfirmed analytical test result and should not be used for non-medical purposes. Clinical consideration and professional judgment should be applied to any positive drug screen result due to possible interfering substances. A more specific alternate chemical method must be used in order to obtain a confirmed analytical result. Gas chromatography / mass spectrometry (GC/MS) is the preferred confirmat ory method. Performed at [facility], [address] [place], [place] [address]   Magnesium     Status: Abnormal   Collection Time: 04/17/18  2:36 AM  Result Value Ref Range   Magnesium 1.3 (L) 1.7 - 2.4 mg/dL    Comment: Performed at [facility], [address] [place], [place] [address]  Basic metabolic panel     Status: Abnormal   Collection Time: 04/17/18 12:41 PM  Result Value Ref Range   Sodium 138 135 - 145 mmol/L   Potassium 3.1 (L) 3.5 - 5.1 mmol/L   Chloride 97 (L) 101 - 111 mmol/L   CO2 31 22 - 32 mmol/L   Glucose, Bld 100 (H) 65 - 99 mg/dL   BUN 8 6 - 20 mg/dL   Creatinine, Ser 0.62 0.44 - 1.00 mg/dL   Calcium 9.6 8.9 - 10.3 mg/dL   GFR calc non Af Amer >60 >60 mL/min   GFR calc Af Amer >60 >60 mL/min    Comment: (NOTE) The eGFR has been calculated using the CKD EPI equation. This calculation has not been validated in all clinical  situations. eGFR's persistently <60 mL/min signify possible Chronic Kidney Disease.    Anion gap 10 5 - 15    Comment: Performed at [facility], [address] [place], [place] [address]  Magnesium     Status: None   Collection Time: 04/17/18 12:41 PM  Result Value Ref Range   Magnesium 1.8 1.7 - 2.4 mg/dL    Comment: Performed at [facility], [address] [place], [place] [address]    Current Facility-Administered Medications  Medication Dose Route Frequency Provider Last Rate Last Dose  . albuterol (PROVENTIL) (2.5 MG/3ML) 0.083% nebulizer solution 2.5 mg  2.5 mg Nebulization Q4H PRN Hinda Kehr, MD      . amLODipine (NORVASC) tablet 10 mg  10 mg Oral Daily Hinda Kehr, MD   10 mg at 04/17/18 1109  . aspirin EC tablet 81 mg  81 mg Oral Daily Hinda Kehr, MD   81 mg at 04/17/18 1111  . atorvastatin (LIPITOR) tablet 10 mg  10 mg Oral Daily Hinda Kehr, MD   10 mg at 04/17/18 1112  . betamethasone (augmented) (DIPROLENE) 0.05 % lotion  Topical BID Hinda Kehr, MD      . clotrimazole (LOTRIMIN) 1 % cream   Topical BID Hinda Kehr, MD      . diclofenac sodium (VOLTAREN) 1 % transdermal gel 2 g  2 g Topical QID Hinda Kehr, MD   2 g at 04/17/18 1340  . escitalopram (LEXAPRO) tablet 20 mg  20 mg Oral Daily Hinda Kehr, MD   20 mg at 04/17/18 1109  . fluticasone furoate-vilanterol (BREO ELLIPTA) 100-25 MCG/INH 1 puff  1 puff Inhalation Daily McShane, James A, MD      . hydrochlorothiazide (HYDRODIURIL) tablet 25 mg  25 mg Oral Daily Hinda Kehr, MD   25 mg at 04/17/18 1111  . HYDROcodone-acetaminophen (NORCO/VICODIN) 5-325 MG per tablet 2 tablet  2 tablet Oral Q8H PRN Schuyler Amor, MD   2 tablet at 04/17/18 1339  . ipratropium (ATROVENT) nebulizer solution 0.5 mg  0.5 mg Nebulization Q4H PRN Hinda Kehr, MD      . levETIRAcetam (KEPPRA) tablet 750 mg  750 mg Oral Daily Hinda Kehr, MD   750 mg at 04/17/18 1340  . LORazepam (ATIVAN) injection  0-4 mg  0-4 mg Intravenous Q6H Schuyler Amor, MD       Or  . LORazepam (ATIVAN) tablet 0-4 mg  0-4 mg Oral Q6H Schuyler Amor, MD   1 mg at 04/17/18 1113  . [START ON 04/19/2018] LORazepam (ATIVAN) injection 0-4 mg  0-4 mg Intravenous Q12H Schuyler Amor, MD       Or  . Derrill Memo ON 04/19/2018] LORazepam (ATIVAN) tablet 0-4 mg  0-4 mg Oral Q12H Schuyler Amor, MD      . losartan (COZAAR) tablet 25 mg  25 mg Oral Daily Hinda Kehr, MD   25 mg at 04/17/18 1115  . magnesium oxide (MAG-OX) tablet 400 mg  400 mg Oral BID Hinda Kehr, MD   400 mg at 04/17/18 0441  . mirtazapine (REMERON) tablet 15 mg  15 mg Oral QHS Hinda Kehr, MD      . pantoprazole (PROTONIX) EC tablet 40 mg  40 mg Oral Daily Hinda Kehr, MD   40 mg at 04/17/18 1110  . potassium chloride SA (K-DUR,KLOR-CON) CR tablet 40 mEq  40 mEq Oral BID Hinda Kehr, MD   40 mEq at 04/17/18 1111  . sucralfate (CARAFATE) tablet 1 g  1 g Oral QID Hinda Kehr, MD   1 g at 04/17/18 1112  . thiamine (VITAMIN B-1) tablet 100 mg  100 mg Oral Daily Schuyler Amor, MD   100 mg at 04/17/18 1113   Or  . thiamine (B-1) injection 100 mg  100 mg Intravenous Daily Schuyler Amor, MD      . tiZANidine (ZANAFLEX) tablet 4 mg  4 mg Oral TID Hinda Kehr, MD   4 mg at 04/17/18 1222  . torsemide (DEMADEX) tablet 5 mg  5 mg Oral Daily Hinda Kehr, MD   5 mg at 04/17/18 1222  . venlafaxine (EFFEXOR) tablet 75 mg  75 mg Oral Daily Hinda Kehr, MD   75 mg at 04/17/18 1227   Current Outpatient Medications  Medication Sig Dispense Refill  . albuterol (PROVENTIL) (2.5 MG/3ML) 0.083% nebulizer solution Take 2.5 mg by nebulization every 4 (four) hours as needed for wheezing or shortness of breath.    . Albuterol Sulfate 108 (90 Base) MCG/ACT AEPB Inhale 2 puffs into the lungs every 6 (six) hours as needed.    Marland Kitchen amLODipine (NORVASC) 10 MG  tablet Take 10 mg by mouth daily.    Marland Kitchen aspirin EC 81 MG tablet Take 81 mg by mouth daily.    Marland Kitchen atorvastatin  (LIPITOR) 10 MG tablet Take 10 mg by mouth daily.    . betamethasone, augmented, (DIPROLENE) 0.05 % lotion Apply topically 2 (two) times daily.    . budesonide-formoterol (SYMBICORT) 160-4.5 MCG/ACT inhaler Inhale 2 puffs into the lungs 2 (two) times daily.    . clotrimazole-betamethasone (LOTRISONE) cream Apply 1 application topically 2 (two) times daily.    . diclofenac sodium (VOLTAREN) 1 % GEL Apply 2 g topically 4 (four) times daily.    . ergocalciferol (VITAMIN D2) 50000 units capsule Take 1 capsule (50,000 Units total) by mouth 2 (two) times a week. X 6 weeks. Take with 600 mg of OTC Calcium 12 capsule 0  . escitalopram (LEXAPRO) 20 MG tablet Take 20 mg by mouth daily.    . hydrochlorothiazide (HYDRODIURIL) 25 MG tablet Take 25 mg by mouth daily.    Marland Kitchen HYDROcodone-acetaminophen (NORCO) 10-325 MG tablet Take 1 tablet by mouth every 8 (eight) hours as needed for severe pain. 90 tablet 0  . ipratropium (ATROVENT) 0.02 % nebulizer solution Take 0.5 mg by nebulization every 4 (four) hours as needed for wheezing or shortness of breath.    . ketoconazole (NIZORAL) 2 % shampoo Apply 1 application topically 2 (two) times a week.    . lactulose (CHRONULAC) 10 GM/15ML solution TK 15 ML PO BID PRN  5  . levETIRAcetam (KEPPRA) 750 MG tablet Take 750 mg by mouth daily.    Marland Kitchen losartan (COZAAR) 25 MG tablet Take 25 mg by mouth daily.    . mirtazapine (REMERON) 15 MG tablet Take 15 mg by mouth at bedtime.    . nitroGLYCERIN (NITROSTAT) 0.4 MG SL tablet Place 0.4 mg under the tongue every 5 (five) minutes as needed for chest pain.    Marland Kitchen ondansetron (ZOFRAN) 4 MG tablet   1  . pantoprazole (PROTONIX) 40 MG tablet Take 40 mg by mouth daily.    . potassium chloride SA (K-DUR,KLOR-CON) 20 MEQ tablet Take 20 mEq by mouth 2 (two) times daily.    . sucralfate (CARAFATE) 1 g tablet Take 1 g by mouth 4 (four) times daily.    Marland Kitchen tiZANidine (ZANAFLEX) 4 MG tablet Take 1 tablet (4 mg total) by mouth 3 (three) times daily.  90 tablet 0  . torsemide (DEMADEX) 5 MG tablet   11  . venlafaxine (EFFEXOR) 75 MG tablet Take 75 mg by mouth daily.      Musculoskeletal: Strength & Muscle Tone: within normal limits Gait & Station: normal Patient leans: N/A  Psychiatric Specialty Exam: Physical Exam  Nursing note and vitals reviewed. Constitutional: She appears well-developed and well-nourished.  HENT:  Head: Normocephalic and atraumatic.  Eyes: Pupils are equal, round, and reactive to light. Conjunctivae are normal.  Neck: Normal range of motion.  Cardiovascular: Regular rhythm and normal heart sounds.  Respiratory: Effort normal. No respiratory distress.  GI: Soft.  Musculoskeletal: Normal range of motion.  Neurological: She is alert.  Skin: Skin is warm and dry.  Psychiatric: Her mood appears anxious. Her affect is labile and inappropriate. Her speech is rapid and/or pressured and tangential. She is agitated. She is not aggressive. Thought content is paranoid. Cognition and memory are impaired. She expresses impulsivity and inappropriate judgment. She expresses no homicidal and no suicidal ideation.    Review of Systems  Constitutional: Negative.   HENT: Negative.  Eyes: Negative.   Respiratory: Negative.   Cardiovascular: Negative.   Gastrointestinal: Negative.   Musculoskeletal: Negative.   Skin: Negative.   Neurological: Negative.   Psychiatric/Behavioral: Negative for depression, hallucinations, memory loss, substance abuse and suicidal ideas. The patient is nervous/anxious and has insomnia.     Blood pressure (!) 162/89, pulse (!) 117, temperature 99.3 F (37.4 C), temperature source Oral, resp. rate 16, SpO2 97 %.There is no height or weight on file to calculate BMI.  General Appearance: Casual  Eye Contact:  Good  Speech:  Garbled and Pressured  Volume:  Increased  Mood:  Irritable  Affect:  Inappropriate and Labile  Thought Process:  Disorganized  Orientation:  Full (Time, Place, and  Person)  Thought Content:  Illogical, Hallucinations: Auditory and Paranoid Ideation  Suicidal Thoughts:  No  Homicidal Thoughts:  No  Memory:  Immediate;   Fair Recent;   Poor Remote;   Fair  Judgement:  Impaired  Insight:  Shallow  Psychomotor Activity:  Restlessness  Concentration:  Concentration: Fair  Recall:  [employer] of Knowledge:  Fair  Language:  Fair  Akathisia:  No  Handed:  Right  AIMS (if indicated):     Assets:  Communication Skills Desire for Improvement Housing Resilience  ADL's:  Intact  Cognition:  Impaired,  Mild  Sleep:        Treatment Plan Summary: Daily contact with patient to assess and evaluate symptoms and progress in treatment, Medication management and Plan 62 year old woman with only a vaguely described past psychiatric history of depression who presents with an acute history and mental status exam that would be most consistent with mania.  During the conversation she appeared to be able to stay oriented to the situation but her thoughts are rambling with flight of ideas.  A lot of what she talks about happening at her daughter's house suggest auditory hallucinations and paranoia.  I looked through her medicines and while she is on narcotic pain medicine she is not on any steroids.  Not sure if there could be any medical cause for the psychosis.  She is not significantly demented although she had some trouble with short-term memory.  Overall patient probably would do better for safety to be admitted to the hospital.  She forbade me up front to try to call her daughter which puts me in a tough situation with clarifying the history.  We will go ahead and plan for admission here.  Patient had abnormal electrolytes in the emergency room those have been corrected but we will recheck those tomorrow as well.  Disposition: Recommend psychiatric Inpatient admission when medically cleared. Supportive therapy provided about ongoing stressors.  Alethia Berthold,  MD 04/17/2018 3:11 PM

## 2018-04-17 NOTE — ED Notes (Addendum)

## 2018-04-17 NOTE — ED Triage Notes (Signed)
Pt arrives via [facility] under IVC. Pt denies SI or HI. Pt says that she has anxiety and takes medications for the same.

## 2018-04-17 NOTE — ED Notes (Signed)
Per deputy, the patient was at her daughter's home tonight, called 911 to call her an uber or taxi to pick her up from her daughters house. Reports on arrival to the scene, the pt had all her belongings on the porch and wanted to leave. The daughter has concerns about the patients mental state and has gone to magistrate office to take out IVC papers. Pt denies SI or HI.

## 2018-04-18 DIAGNOSIS — F312 Bipolar disorder, current episode manic severe with psychotic features: Secondary | ICD-10-CM

## 2018-04-18 LAB — HEMOGLOBIN A1C
HEMOGLOBIN A1C: 5.9 % — AB (ref 4.8–5.6)
MEAN PLASMA GLUCOSE: 122.63 mg/dL

## 2018-04-18 LAB — LIPID PANEL
Cholesterol: 154 mg/dL (ref 0–200)
HDL: 64 mg/dL (ref >40)
LDL CALC: 81 mg/dL (ref 0–99)
TRIGLYCERIDES: 44 mg/dL (ref <150)
Total CHOL/HDL Ratio: 2.4 RATIO
VLDL: 9 mg/dL (ref 0–40)

## 2018-04-18 LAB — BASIC METABOLIC PANEL
Anion gap: 9 (ref 5–15)
BUN: 10 mg/dL (ref 6–20)
CHLORIDE: 98 mmol/L — AB (ref 101–111)
CO2: 29 mmol/L (ref 22–32)
CREATININE: 0.6 mg/dL (ref 0.44–1.00)
Calcium: 9.2 mg/dL (ref 8.9–10.3)
GFR calc non Af Amer: >60 mL/min (ref >60)
Glucose, Bld: 97 mg/dL (ref 65–99)
Potassium: 3.7 mmol/L (ref 3.5–5.1)
SODIUM: 136 mmol/L (ref 135–145)

## 2018-04-18 LAB — MAGNESIUM: MAGNESIUM: 1.8 mg/dL (ref 1.7–2.4)

## 2018-04-18 LAB — TSH: TSH: 0.958 u[IU]/mL (ref 0.350–4.500)

## 2018-04-18 MED ORDER — QUETIAPINE FUMARATE 100 MG PO TABS
100.0000 mg | ORAL_TABLET | Freq: Every day | ORAL | Status: DC
  Administered 2018-04-18: 100 mg via ORAL
  Filled 2018-04-18: qty 1

## 2018-04-18 MED ORDER — ADULT MULTIVITAMIN W/MINERALS CH
1.0000 | ORAL_TABLET | Freq: Every day | ORAL | Status: DC
  Administered 2018-04-18 – 2018-04-19 (×2): 1 via ORAL
  Filled 2018-04-18 (×2): qty 1

## 2018-04-18 MED ORDER — ALBUTEROL SULFATE (2.5 MG/3ML) 0.083% IN NEBU
2.5000 mg | INHALATION_SOLUTION | Freq: Two times a day (BID) | RESPIRATORY_TRACT | Status: DC
  Administered 2018-04-18 – 2018-04-19 (×2): 2.5 mg via RESPIRATORY_TRACT
  Filled 2018-04-18 (×2): qty 3

## 2018-04-18 MED ORDER — ENSURE ENLIVE PO LIQD
237.0000 mL | Freq: Two times a day (BID) | ORAL | Status: DC
  Administered 2018-04-18 – 2018-04-19 (×3): 237 mL via ORAL

## 2018-04-18 NOTE — Progress Notes (Signed)
Recreation Therapy Notes  Date: 04/18/2018  Time: 9:30 am   Location: Craft Room   Behavioral response: N/A   Intervention Topic: Creative Expressions  Discussion/Intervention: Patient did not attend group.   Clinical Observations/Feedback:  Patient did not attend group.   Marlon Vonruden LRT/CTRS        Michalla Ringer 04/18/2018 10:17 AM
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
## 2018-04-18 NOTE — H&P (Signed)
Psychiatric Admission Assessment Adult  Patient Identification: Jean Gomez MRN:  [id number] Date of Evaluation:  04/18/2018 Chief Complaint:  Depression Principal Diagnosis: Bipolar affective disorder, current episode manic ([place]) Diagnosis:   Patient Active Problem List   Diagnosis Date Noted  . Bipolar affective disorder, current episode manic ([place]) [F31.9] 04/17/2018  . Chronic hip pain [facility] Area of Pain) (Bilateral) (R>L) [M25.551, M25.552, G89.29] 02/26/2018  . DDD (degenerative disc disease), cervical [M50.30] 02/26/2018  . Cervical facet arthropathy (Bilateral) [I01.655] 02/26/2018  . Cervical facet syndrome (Bilateral) (R>L) [V74.827] 02/26/2018  . Cervical Grade 1 Anterolisthesis of C4 over C5 (Degenerative) [M43.10] 02/26/2018  . Cervical Grade 1 Retrolisthesis of C5 over C6 [M43.10] 02/26/2018  . Cervical foraminal stenosis (C3-4, C4-5, and C5-6) (Bilateral) [M99.81] 02/26/2018  . DDD (degenerative disc disease), lumbar [M51.36] 02/26/2018  . 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 Osteoarthritis of facet joint of lumbar spine [M47.816] 02/26/2018  . Spondylosis without myelopathy or radiculopathy, cervicothoracic region [M47.813] 02/26/2018  . Occipital headache (Right) [R51] 02/25/2018  . Frontal headache (Left) [R51] 02/25/2018  . Chronic low back pain (Midline) (Secondary Area of Pain) [M54.41, G89.29] 02/25/2018  . Chronic lumbar radiculopathy (Right) [M54.16] 02/25/2018  . Osteoarthritis involving multiple joints [M15.0] 02/25/2018  . 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 Chronic musculoskeletal pain [M79.18, G89.29] 02/25/2018  . Lumbar facet joint syndrome (Bilateral) (R>L) [M47.816] 02/25/2018  . Vitamin D deficiency [E55.9] 02/14/2018  . Chronic pain syndrome [G89.4] 02/07/2018  . Pharmacologic therapy [Z79.899] 02/07/2018  . Disorder of skeletal system [M89.9] 02/07/2018  . Problems influencing health  status [Z78.9] 02/07/2018  . Long term current use of opiate analgesic [Z79.891] 02/07/2018  . Chronic low back pain (Bilateral) with right-sided sciatica (Secondary Area of Pain) (R>L) [M54.41, G89.29] 02/07/2018  . Chronic lower extremity pain [facility] Area of Pain) (Bilateral) (R>L) [id number], M79.605, G89.29] 02/07/2018  . Chronic knee pain (Fourth Area of Pain) (Bilateral) (R>L) [M25.561, M25.562, G89.29] 02/07/2018  . Chronic shoulder pain (Fifth Area of Pain) (Bilateral) (R>L) [M25.511, G89.29, M25.512] 02/07/2018  . Occipital neuralgia (Primary Area of Pain) (Bilateral) (R>L) [M54.81] 02/07/2018  . Chronic neck pain (Bilateral) (R>L) [M54.2, G89.29] 02/07/2018  . Chronic sacroiliac joint pain (Right) [M53.3, G89.29] 02/07/2018  . H/O aneurysm [Z86.79] 01/30/2018  . History of depression [Z86.59] 01/30/2018  . History of seizure disorder [Z86.69] 01/30/2018  . Chronic headache disorder (Primary Area of Pain) [R51] 01/30/2018  . Chronic migraine without aura [G43.709] 01/17/2018  . Chronic respiratory failure with hypoxia ([place]) [J96.11] 01/17/2018  . Constipation [K59.00] 01/17/2018  . HTN, goal below 140/90 [I10] 01/17/2018  . Hypercholesterolemia [E78.00] 01/17/2018  . Major depression in remission ([place]) [F32.5] 01/17/2018  . COPD (chronic obstructive pulmonary disease) ([place]) [J44.9] 01/17/2018  . Sarcoidosis [D86.9] 01/17/2018  . Seizure disorder ([place]) [M78.675] 01/17/2018  . Nonruptured cerebral aneurysm [I67.1] 10/22/2017  . Obstructive sleep apnea on CPAP [G47.33, Z99.89] 09/11/2017  . Generalized anxiety disorder [F41.1] 01/26/2017  . Irritable bowel syndrome [K58.9] 01/26/2017  . Pulmonary emphysema ([place]) [J43.9] 01/26/2017  . GERD (gastroesophageal reflux disease) [K21.9] 08/02/2016  . Hiatal hernia [K44.9] 08/02/2016  . Essential tremor [G25.0] 07/19/2016  . Muscle spasm [M62.838] 10/05/2015  . Spondylosis of lumbar region without myelopathy or radiculopathy [M47.816]  10/05/2015  . Rotator cuff injury [S46.009A] 09/17/2015  . Chronic shoulder pain (Right) [M25.511, G89.29] 09/17/2015  . Epilepsy ([place]) [Q49.201] 07/14/2015  . Insomnia [G47.00] 07/14/2015  . Osteoarthritis of knee (Bilateral) (R>L) [M17.0] 06/24/2015   History  of Present Illness: 62 year old woman admitted through the emergency room on papers that alleged that she has been inappropriate and bizarre in her behavior.  Patient presented with racing thoughts disorganized speech paranoid ideation and inability to articulate an appropriate plan for herself. Associated Signs/Symptoms: Depression Symptoms:  psychomotor agitation, (Hypo) Manic Symptoms:  Elevated Mood, Flight of Ideas, Grandiosity, Impulsivity, Irritable Mood, Anxiety Symptoms:  Social Anxiety, Psychotic Symptoms:  Delusions, Paranoia, PTSD Symptoms: Negative Total Time spent with patient: 1 hour  Past Psychiatric History: Patient has a past history of no known chronic psychiatric problems  Is the patient at risk to self? Yes.    Has the patient been a risk to self in the past 6 months? No.  Has the patient been a risk to self within the distant past? No.  Is the patient a risk to others? No.  Has the patient been a risk to others in the past 6 months? No.  Has the patient been a risk to others within the distant past? No.   Prior Inpatient Therapy:   Prior Outpatient Therapy:    Alcohol Screening: Patient refused Alcohol Screening Tool: Yes(Pt. stated she doesn't drink) 1. How often do you have a drink containing alcohol?: Never 2. How many drinks containing alcohol do you have on a typical day when you are drinking?: 1 or 2 3. How often do you have six or more drinks on one occasion?: Never AUDIT-C Score: 0 4. How often during the last year have you found that you were not able to stop drinking once you had started?: Never 5. How often during the last year have you failed to do what was normally expected from you  becasue of drinking?: Never 6. How often during the last year have you needed a first drink in the morning to get yourself going after a heavy drinking session?: Never 7. How often during the last year have you had a feeling of guilt of remorse after drinking?: Never 8. How often during the last year have you been unable to remember what happened the night before because you had been drinking?: Never 9. Have you or someone else been injured as a result of your drinking?: No 10. Has a relative or friend or a doctor or another health worker been concerned about your drinking or suggested you cut down?: No Alcohol Use Disorder Identification Test Final Score (AUDIT): 0 Intervention/Follow-up: AUDIT Score <7 follow-up not indicated Substance Abuse History in the last 12 months:  No. Consequences of Substance Abuse: Negative Previous Psychotropic Medications: No  Psychological Evaluations: No  Past Medical History:  Past Medical History:  Diagnosis Date  . Asthma   . Cerebral aneurysm   . Chest pain 11/01/2016  . Chronic migraine   . Chronic respiratory failure ([place])   . COPD (chronic obstructive pulmonary disease) ([place])   . Hypertension   . Osteoarthritis   . Sarcoid   . Seizures ([place])     Past Surgical History:  Procedure Laterality Date  . ABDOMINAL HYSTERECTOMY    . CHOLECYSTECTOMY    . SPINAL FUSION     Family History: History reviewed. No pertinent family history. Family Psychiatric  History: None known Tobacco Screening: Have you used any form of tobacco in the last 30 days? (Cigarettes, Smokeless Tobacco, Cigars, and/or Pipes): No Social History:  Social History   Substance and Sexual Activity  Alcohol Use Yes   Comment: occasional     Social History   Substance and Sexual Activity  Drug Use No    Additional Social History: Marital status: Divorced Divorced, when?: 2002 What types of issues is patient dealing with in the relationship?: none reported Additional  relationship information: None reported.  Are you sexually active?: No What is your sexual orientation?: Heterosexual  Has your sexual activity been affected by drugs, alcohol, medication, or emotional stress?: Pt denies.  Does patient have children?: Yes How many children?: 2 How is patient's relationship with their children?: Pt reports she has a great relationship with her son and her daughter.                          Allergies:   Allergies  Allergen Reactions  . Gabapentin   . Labetalol Other (See Comments)    Made hair fall out  . Sulfabenzamide Nausea Only  . Fentanyl Rash  . Penicillins Rash   Lab Results:  Results for orders placed or performed during the hospital encounter of 04/17/18 (from the past 48 hour(s))  Basic metabolic panel     Status: Abnormal   Collection Time: 04/18/18  6:34 AM  Result Value Ref Range   Sodium 136 135 - 145 mmol/L   Potassium 3.7 3.5 - 5.1 mmol/L   Chloride 98 (L) 101 - 111 mmol/L   CO2 29 22 - 32 mmol/L   Glucose, Bld 97 65 - 99 mg/dL   BUN 10 6 - 20 mg/dL   Creatinine, Ser 0.60 0.44 - 1.00 mg/dL   Calcium 9.2 8.9 - 10.3 mg/dL   GFR calc non Af Amer >60 >60 mL/min   GFR calc Af Amer >60 >60 mL/min    Comment: (NOTE) The eGFR has been calculated using the CKD EPI equation. This calculation has not been validated in all clinical situations. eGFR's persistently <60 mL/min signify possible Chronic Kidney Disease.    Anion gap 9 5 - 15    Comment: Performed at [facility], [address] [place], [place] [address]  Magnesium     Status: None   Collection Time: 04/18/18  6:34 AM  Result Value Ref Range   Magnesium 1.8 1.7 - 2.4 mg/dL    Comment: Performed at [facility], [address] [place], [place] [address]  Lipid panel     Status: None   Collection Time: 04/18/18  6:34 AM  Result Value Ref Range   Cholesterol 154 0 - 200 mg/dL   Triglycerides 44 <150 mg/dL   HDL 64 >40 mg/dL   Total  CHOL/HDL Ratio 2.4 RATIO   VLDL 9 0 - 40 mg/dL   LDL Cholesterol 81 0 - 99 mg/dL    Comment:        Total Cholesterol/HDL:CHD Risk Coronary Heart Disease Risk Table                     Men   Women  1/2 Average Risk   3.4   3.3  Average Risk       5.0   4.4  2 X Average Risk   9.6   7.1  3 X Average Risk  23.4   11.0        Use the calculated Patient Ratio above and the CHD Risk Table to determine the patient's CHD Risk.        ATP III CLASSIFICATION (LDL):  <100     mg/dL   Optimal  100-129  mg/dL   Near or Above  Optimal  130-159  mg/dL   Borderline  160-189  mg/dL   High  >190     mg/dL   Very High Performed at [facility], [address] [place], [place] [address]   TSH     Status: None   Collection Time: 04/18/18  6:34 AM  Result Value Ref Range   TSH 0.958 0.350 - 4.500 uIU/mL    Comment: Performed by a 3rd Generation assay with a functional sensitivity of <=0.01 uIU/mL. Performed at [facility], [address] [place], [place] [address]     Blood Alcohol level:  Lab Results  Component Value Date   [facility] <10 62/83/6629    Metabolic Disorder Labs:  No results found for: HGBA1C, MPG No results found for: PROLACTIN Lab Results  Component Value Date   CHOL 154 04/18/2018   TRIG 44 04/18/2018   HDL 64 04/18/2018   CHOLHDL 2.4 04/18/2018   VLDL 9 04/18/2018   LDLCALC 81 04/18/2018    Current Medications: Current Facility-Administered Medications  Medication Dose Route Frequency Provider Last Rate Last Dose  . acetaminophen (TYLENOL) tablet 650 mg  650 mg Oral Q6H PRN Clapacs, John T, MD      . albuterol (PROVENTIL) (2.5 MG/3ML) 0.083% nebulizer solution 2.5 mg  2.5 mg Nebulization BID Clapacs, John T, MD      . alum & mag hydroxide-simeth (MAALOX/MYLANTA) 200-200-20 MG/5ML suspension 30 mL  30 mL Oral Q4H PRN Clapacs, John T, MD      . amLODipine (NORVASC) tablet 10 mg  10 mg Oral Daily Clapacs, Madie Reno, MD   10 mg at  04/18/18 4765  . aspirin EC tablet 81 mg  81 mg Oral Daily Clapacs, Madie Reno, MD   81 mg at 04/18/18 0827  . atorvastatin (LIPITOR) tablet 10 mg  10 mg Oral Daily Clapacs, Madie Reno, MD   10 mg at 04/18/18 0814  . clotrimazole (LOTRIMIN) 1 % cream 1 application  1 application Topical BID Clapacs, Madie Reno, MD   1 application at 46/50/35 1731  . diclofenac sodium (VOLTAREN) 1 % transdermal gel 2 g  2 g Topical QID Clapacs, Madie Reno, MD   2 g at 04/18/18 1731  . escitalopram (LEXAPRO) tablet 20 mg  20 mg Oral Daily Clapacs, Madie Reno, MD   20 mg at 04/18/18 0819  . feeding supplement (ENSURE ENLIVE) (ENSURE ENLIVE) liquid 237 mL  237 mL Oral BID BM Clapacs, John T, MD   237 mL at 04/18/18 1352  . fluticasone furoate-vilanterol (BREO ELLIPTA) 100-25 MCG/INH 1 puff  1 puff Inhalation Daily Clapacs, Madie Reno, MD   1 puff at 04/18/18 0829  . hydrochlorothiazide (HYDRODIURIL) tablet 25 mg  25 mg Oral Daily Clapacs, Madie Reno, MD   25 mg at 04/18/18 4656  . HYDROcodone-acetaminophen (NORCO/VICODIN) 5-325 MG per tablet 2 tablet  2 tablet Oral Q8H PRN Clapacs, Madie Reno, MD   2 tablet at 04/18/18 1127  . levETIRAcetam (KEPPRA) tablet 750 mg  750 mg Oral Daily Clapacs, Madie Reno, MD   750 mg at 04/18/18 0819  . losartan (COZAAR) tablet 25 mg  25 mg Oral Daily Clapacs, Madie Reno, MD   25 mg at 04/18/18 0824  . magnesium hydroxide (MILK OF MAGNESIA) suspension 30 mL  30 mL Oral Daily PRN Clapacs, John T, MD      . magnesium oxide (MAG-OX) tablet 400 mg  400 mg Oral BID Clapacs, Madie Reno, MD   400 mg at  04/18/18 1731  . multivitamin with minerals tablet 1 tablet  1 tablet Oral Daily Clapacs, Madie Reno, MD   1 tablet at 04/18/18 1124  . pantoprazole (PROTONIX) EC tablet 40 mg  40 mg Oral Daily Clapacs, Madie Reno, MD   40 mg at 04/18/18 0820  . potassium chloride SA (K-DUR,KLOR-CON) CR tablet 40 mEq  40 mEq Oral BID Clapacs, Madie Reno, MD   40 mEq at 04/18/18 1731  . QUEtiapine (SEROQUEL) tablet 100 mg  100 mg Oral QHS Clapacs, John T, MD      .  sucralfate (CARAFATE) tablet 1 g  1 g Oral QID Clapacs, Madie Reno, MD   1 g at 04/18/18 1731  . tiZANidine (ZANAFLEX) tablet 4 mg  4 mg Oral Q8H PRN Clapacs, Madie Reno, MD   4 mg at 04/17/18 2114  . torsemide (DEMADEX) tablet 5 mg  5 mg Oral Daily Clapacs, Madie Reno, MD   5 mg at 04/18/18 0820  . venlafaxine [facility]) tablet 75 mg  75 mg Oral Daily Clapacs, Madie Reno, MD   75 mg at 04/18/18 2122   PTA Medications: Medications Prior to Admission  Medication Sig Dispense Refill Last Dose  . amLODipine (NORVASC) 10 MG tablet Take 10 mg by mouth daily.   04/17/2018 at Unknown time  . aspirin EC 81 MG tablet Take 81 mg by mouth daily.   04/17/2018 at Unknown time  . atorvastatin (LIPITOR) 10 MG tablet Take 10 mg by mouth daily.   04/17/2018 at Unknown time  . betamethasone, augmented, (DIPROLENE) 0.05 % lotion Apply topically 2 (two) times daily.   04/17/2018 at Unknown time  . budesonide-formoterol (SYMBICORT) 160-4.5 MCG/ACT inhaler Inhale 2 puffs into the lungs 2 (two) times daily.   04/17/2018 at Unknown time  . clotrimazole-betamethasone (LOTRISONE) cream Apply 1 application topically 2 (two) times daily.   04/17/2018 at Unknown time  . diclofenac sodium (VOLTAREN) 1 % GEL Apply 2 g topically 4 (four) times daily.   04/17/2018 at Unknown time  . ondansetron (ZOFRAN) 4 MG tablet   1 04/17/2018 at Unknown time  . pantoprazole (PROTONIX) 40 MG tablet Take 40 mg by mouth daily.   04/17/2018 at Unknown time  . potassium chloride SA (K-DUR,KLOR-CON) 20 MEQ tablet Take 20 mEq by mouth 2 (two) times daily.   04/17/2018 at Unknown time  . sucralfate (CARAFATE) 1 g tablet Take 1 g by mouth 4 (four) times daily.   04/17/2018 at Unknown time  . tiZANidine (ZANAFLEX) 4 MG tablet Take 1 tablet (4 mg total) by mouth 3 (three) times daily. 90 tablet 0 04/17/2018 at Unknown time  . torsemide (DEMADEX) 5 MG tablet   11 04/17/2018 at Unknown time  . venlafaxine (EFFEXOR) 75 MG tablet Take 75 mg by mouth daily.   04/17/2018 at Unknown time  . albuterol  (PROVENTIL) (2.5 MG/3ML) 0.083% nebulizer solution Take 2.5 mg by nebulization every 4 (four) hours as needed for wheezing or shortness of breath.   04/16/2018  . Albuterol Sulfate 108 (90 Base) MCG/ACT AEPB Inhale 2 puffs into the lungs every 6 (six) hours as needed.   04/16/2018  . ergocalciferol (VITAMIN D2) 50000 units capsule Take 1 capsule (50,000 Units total) by mouth 2 (two) times a week. X 6 weeks. Take with 600 mg of OTC Calcium 12 capsule 0 Taking  . escitalopram (LEXAPRO) 20 MG tablet Take 20 mg by mouth daily.   04/16/2018  . hydrochlorothiazide (HYDRODIURIL) 25 MG tablet Take 25 mg by  mouth daily.   04/16/2018  . HYDROcodone-acetaminophen (NORCO) 10-325 MG tablet Take 1 tablet by mouth every 8 (eight) hours as needed for severe pain. 90 tablet 0 04/16/2018  . ipratropium (ATROVENT) 0.02 % nebulizer solution Take 0.5 mg by nebulization every 4 (four) hours as needed for wheezing or shortness of breath.   04/16/2018  . ketoconazole (NIZORAL) 2 % shampoo Apply 1 application topically 2 (two) times a week.   04/16/2018  . lactulose (CHRONULAC) 10 GM/15ML solution TK 15 ML PO BID PRN  5 04/16/2018  . levETIRAcetam (KEPPRA) 750 MG tablet Take 750 mg by mouth daily.   04/17/2018  . losartan (COZAAR) 25 MG tablet Take 25 mg by mouth daily.   04/17/2018  . mirtazapine (REMERON) 15 MG tablet Take 15 mg by mouth at bedtime.   04/16/2018  . nitroGLYCERIN (NITROSTAT) 0.4 MG SL tablet Place 0.4 mg under the tongue every 5 (five) minutes as needed for chest pain.   Not Taking at Unknown time    Musculoskeletal: Strength & Muscle Tone: within normal limits Gait & Station: normal Patient leans: N/A  Psychiatric Specialty Exam: Physical Exam  Nursing note and vitals reviewed. Constitutional: She appears well-developed and well-nourished.  HENT:  Head: Normocephalic and atraumatic.  Eyes: Pupils are equal, round, and reactive to light. Conjunctivae are normal.  Neck: Normal range of motion.   Cardiovascular: Regular rhythm and normal heart sounds.  Respiratory: Effort normal. No respiratory distress.  GI: Soft.  Musculoskeletal: Normal range of motion.  Neurological: She is alert.  Skin: Skin is warm and dry.  Psychiatric: Her mood appears anxious. Her speech is rapid and/or pressured. She is agitated. Thought content is paranoid. Cognition and memory are normal. She expresses impulsivity.    Review of Systems  Constitutional: Negative.   HENT: Negative.   Eyes: Negative.   Respiratory: Negative.   Cardiovascular: Negative.   Gastrointestinal: Negative.   Musculoskeletal: Negative.   Skin: Negative.   Neurological: Negative.   Psychiatric/Behavioral: Negative for depression, hallucinations, memory loss, substance abuse and suicidal ideas. The patient is nervous/anxious. The patient does not have insomnia.     Blood pressure (!) 134/105, pulse (!) 108, temperature 99.2 F (37.3 C), temperature source Oral, resp. rate 18, height 5' 4" (1.626 m), weight 74.4 kg (164 lb), SpO2 100 %.Body mass index is 28.15 kg/m.  General Appearance: Fairly Groomed  Eye Contact:  Good  Speech:  Pressured  Volume:  Increased  Mood:  Euphoric  Affect:  Inappropriate and Labile  Thought Process:  Disorganized  Orientation:  Full (Time, Place, and Person)  Thought Content:  Illogical, Delusions and Paranoid Ideation  Suicidal Thoughts:  No  Homicidal Thoughts:  No  Memory:  Immediate;   Fair Recent;   Fair Remote;   Fair  Judgement:  Impaired  Insight:  Shallow  Psychomotor Activity:  Decreased  Concentration:  Concentration: Fair  Recall:  [employer] of Knowledge:  Fair  Language:  Fair  Akathisia:  No  Handed:  Right  AIMS (if indicated):     Assets:  Resilience  ADL's:  Intact  Cognition:  WNL  Sleep:       Treatment Plan Summary: Daily contact with patient to assess and evaluate symptoms and progress in treatment, Medication management and Plan Starting low-dose  Seroquel for agitation psychosis and mood instability.  Trying to work with the patient on some kind of safe discharge plan.  Social work involved in trying to locate where she is  going to stay after she leaves the hospital.  Patient insists that she needs oxygen during the day and that is being continued although I have requested a respiratory therapy consult  Observation Level/Precautions:  15 minute checks  Laboratory:  HbAIC  Psychotherapy:    Medications:    Consultations:    Discharge Concerns:    Estimated LOS:  Other:     Physician Treatment Plan for Primary Diagnosis: Bipolar affective disorder, current episode manic ([place]) Long Term Goal(s): Improvement in symptoms so as ready for discharge  Short Term Goals: Ability to demonstrate self-control will improve and Ability to identify and develop effective coping behaviors will improve  Physician Treatment Plan for Secondary Diagnosis: Principal Problem:   Bipolar affective disorder, current episode manic ([place]) Active Problems:   COPD (chronic obstructive pulmonary disease) ([place])  Long Term Goal(s): Improvement in symptoms so as ready for discharge  Short Term Goals: Ability to maintain clinical measurements within normal limits will improve and Compliance with prescribed medications will improve  I certify that inpatient services furnished can reasonably be expected to improve the patient's condition.    Alethia Berthold, MD 5/2/20198:02 PM

## 2018-04-18 NOTE — Progress Notes (Signed)
Patient slept for 5 hours and 15 minutes.

## 2018-04-18 NOTE — [facility] Suicide Risk Assessment (Signed)
[facility] INPATIENT:  Family/Significant Other Suicide Prevention Education  Suicide Prevention Education:  Patient Refusal for Family/Significant Other Suicide Prevention Education: The patient Jean Gomez has refused to provide written consent for family/significant other to be provided Family/Significant Other Suicide Prevention Education during admission and/or prior to discharge.  Physician notified.  Heidi Dach, LCSW 04/18/2018, 9:46 AM

## 2018-04-18 NOTE — Progress Notes (Signed)
Nursing 1:1 Hourly  Safety Notes:  0745    Pt up and ambulated to dining area with staff at side. Assistive device used/ O2 in use Denies SI/HI or pain at this time.  Pt continues to remain safe on the unit. Sitter at pt side. RN will continue to monitor. 0800  Pt up and ambulated to med room with staff at side. Took medications without issue. 0900 Pt sitting up in bed talking with social worker, no s/sx of distress noted. 1000  Pt up sitting at side of the bed. Taking with 1:1 sitter. No signs or symptoms of pain or distress noted. RN will  continue to monitor. 1100 Pt meeting with Dr. Toni Amend at this time. No distress noted 1200 Pt in day room, with sitter at side. Upset regarding current admission, tearful stating "Im so upset my daughter  got me in here" 1300  Pt up and ambulating to groups. With 1:1 no s/sx of pain or distress noted.  1400 Pt on fresh air break. 1:1 at pt side. No s/sx of distress noted. 1430 pt in with respiratory therapy, 1:1 remains at  pt side. 1500 Pt outside with 1:1 staff. No signs or symptoms of pain or distress noted. 1600 Pt resting in bed with 1:1 @ bedside 1700 Pt in day room with 1:1 at side. Interacting with staff and peers. Up and ambulated to med room at 1730.  Medications administered without issue. 1800 Pt in dayroom watching television with 1:1 staff and peers. 1900 Pt is interacting with staff and peers in dayroom without issue. No s/sx of pain or distress noted. Pt denies need  at this time. RN will continue to monitor for safety.

## 2018-04-18 NOTE — Plan of Care (Signed)
Pt continues to progress towards goals and d/c. RN will continue to monitor.  

## 2018-04-18 NOTE — Progress Notes (Signed)
Initial Nutrition Assessment  DOCUMENTATION CODES:   Not applicable  INTERVENTION:   Ensure Enlive po BID, each supplement provides 350 kcal and 20 grams of protein  MVI daily  Recommend thiamine  po daily  Recommend folic acid  po daily   Recommend cholecalciferol 1000 IUs po daily   Recommend check iron, folate, ferritin, and TIBC labs to rule out deficiency   NUTRITION DIAGNOSIS:   Increased nutrient needs related to chronic illness(COPD, etoh abuse ) as evidenced by icnreased estimated needs.  GOAL:   Patient will meet greater than or equal to 90% of their needs  MONITOR:   PO intake, Supplement acceptance  REASON FOR ASSESSMENT:   Malnutrition Screening Tool    ASSESSMENT:   62 year old woman brought in under petition initiated by her daughter for bizarre behavior    Pt with good appetite and oral intake; eating 100% of meals. Pt with reported etoh abuse that she denies. Per chart, pt appears to be fairly wt stable pta; however, majority of weights appear to be reported. Pt noted to be on home oxygen secondary to COPD. Recommend daily thiamine and folic acid supplementation in setting of possible etoh abuse. Pt with noted vitamin D deficiency and microcytic anemia. Recommend check iron/anemia labs to rule out deficiency (can be done outpatient). Recommend vitamin D supplementation to promote bone health. RD will order supplements and MVI to help pt meet her estimated needs.   Medications reviewed and include: aspirin, hydrochlorothiazide, Mg oxide, protonix, KCl, carafate, torsemide  Labs reviewed: 25- Hydroxy, vitamin D- 14(L)- 01/2018 Hgb 10.8(L), Hct 33.1(L), MCV 75.3(L), MCH 24.6(L), RDW 18.8(H)- 5/1  Diet Order:   Diet Order           Diet Carb Modified Fluid consistency: Thin; Room service appropriate? Yes  Diet effective now         EDUCATION NEEDS:   No education needs have been identified at this time  Skin:  Skin Assessment: Reviewed RN  Assessment  Last BM:  4/30  Height:   Ht Readings from Last 1 Encounters:  04/17/18  (1.626 m)    Weight:   Wt Readings from Last 1 Encounters:  04/17/18 164 lb (74.4 kg)    Ideal Body Weight:  54.5 kg  BMI:  Body mass index is 28.15 kg/m.  Estimated Nutritional Needs:   Kcal:  1600-1800kcal/day   Protein:  75-82g/day   Fluid:  >1.5L/day   Betsey Holiday MS, RD, LDN Pager #[phone] After Hours Pager: [phone]

## 2018-04-18 NOTE — Tx Team (Signed)
Interdisciplinary Treatment and Diagnostic Plan Update  04/18/2018 Time of Session: 3:45PM  Jean Gomez MRN: [id number]  Principal Diagnosis: <principal problem not specified>  Secondary Diagnoses: Active Problems:   Bipolar affective disorder, current episode manic (HCC)   Current Medications:  Current Facility-Administered Medications  Medication Dose Route Frequency Provider Last Rate Last Dose  . acetaminophen (TYLENOL) tablet 650 mg  650 mg Oral Q6H PRN Clapacs, John T, MD      . albuterol (PROVENTIL) (2.5 MG/3ML) 0.083% nebulizer solution 2.5 mg  2.5 mg Nebulization TID Clapacs, John T, MD   2.5 mg at 04/18/18 1428  . alum & mag hydroxide-simeth (MAALOX/MYLANTA) 200-200-20 MG/5ML suspension 30 mL  30 mL Oral Q4H PRN Clapacs, John T, MD      . amLODipine (NORVASC) tablet 10 mg  10 mg Oral Daily Clapacs, Jackquline Denmark, MD   10 mg at 04/18/18 4098  . aspirin EC tablet 81 mg  81 mg Oral Daily Clapacs, Jackquline Denmark, MD   81 mg at 04/18/18 0827  . atorvastatin (LIPITOR) tablet 10 mg  10 mg Oral Daily Clapacs, Jackquline Denmark, MD   10 mg at 04/18/18 0814  . clotrimazole (LOTRIMIN) 1 % cream 1 application  1 application Topical BID Clapacs, Jackquline Denmark, MD   1 application at 04/18/18 [phone]  . diclofenac sodium (VOLTAREN) 1 % transdermal gel 2 g  2 g Topical QID Clapacs, John T, MD   2 g at 04/18/18 1124  . escitalopram (LEXAPRO) tablet 20 mg  20 mg Oral Daily Clapacs, Jackquline Denmark, MD   20 mg at 04/18/18 0819  . feeding supplement (ENSURE ENLIVE) (ENSURE ENLIVE) liquid 237 mL  237 mL Oral BID BM Clapacs, John T, MD   237 mL at 04/18/18 1352  . fluticasone furoate-vilanterol (BREO ELLIPTA) 100-25 MCG/INH 1 puff  1 puff Inhalation Daily Clapacs, Jackquline Denmark, MD   1 puff at 04/18/18 0829  . hydrochlorothiazide (HYDRODIURIL) tablet 25 mg  25 mg Oral Daily Clapacs, Jackquline Denmark, MD   25 mg at 04/18/18 4782  . HYDROcodone-acetaminophen (NORCO/VICODIN) 5-325 MG per tablet 2 tablet  2 tablet Oral Q8H PRN Clapacs, Jackquline Denmark, MD   2 tablet at  04/18/18 1127  . levETIRAcetam (KEPPRA) tablet 750 mg  750 mg Oral Daily Clapacs, Jackquline Denmark, MD   750 mg at 04/18/18 0819  . losartan (COZAAR) tablet 25 mg  25 mg Oral Daily Clapacs, Jackquline Denmark, MD   25 mg at 04/18/18 0824  . magnesium hydroxide (MILK OF MAGNESIA) suspension 30 mL  30 mL Oral Daily PRN Clapacs, John T, MD      . magnesium oxide (MAG-OX) tablet 400 mg  400 mg Oral BID Clapacs, Jackquline Denmark, MD   400 mg at 04/18/18 9562  . multivitamin with minerals tablet 1 tablet  1 tablet Oral Daily Clapacs, Jackquline Denmark, MD   1 tablet at 04/18/18 1124  . pantoprazole (PROTONIX) EC tablet 40 mg  40 mg Oral Daily Clapacs, Jackquline Denmark, MD   40 mg at 04/18/18 0820  . potassium chloride SA (K-DUR,KLOR-CON) CR tablet 40 mEq  40 mEq Oral BID Clapacs, Jackquline Denmark, MD   40 mEq at 04/18/18 0823  . QUEtiapine (SEROQUEL) tablet 50 mg  50 mg Oral QHS Clapacs, Jackquline Denmark, MD   50 mg at 04/17/18 2122  . sucralfate (CARAFATE) tablet 1 g  1 g Oral QID Clapacs, John T, MD   1 g at 04/18/18 1124  . tiZANidine (ZANAFLEX) tablet 4 mg  4 mg Oral Q8H PRN Clapacs, Jackquline Denmark, MD   4 mg at 04/17/18 2114  . torsemide (DEMADEX) tablet 5 mg  5 mg Oral Daily Clapacs, Jackquline Denmark, MD   5 mg at 04/18/18 0820  . venlafaxine [facility]) tablet 75 mg  75 mg Oral Daily Clapacs, Jackquline Denmark, MD   75 mg at 04/18/18 8295   PTA Medications: Medications Prior to Admission  Medication Sig Dispense Refill Last Dose  . amLODipine (NORVASC) 10 MG tablet Take 10 mg by mouth daily.   04/17/2018 at Unknown time  . aspirin EC 81 MG tablet Take 81 mg by mouth daily.   04/17/2018 at Unknown time  . atorvastatin (LIPITOR) 10 MG tablet Take 10 mg by mouth daily.   04/17/2018 at Unknown time  . betamethasone, augmented, (DIPROLENE) 0.05 % lotion Apply topically 2 (two) times daily.   04/17/2018 at Unknown time  . budesonide-formoterol (SYMBICORT) 160-4.5 MCG/ACT inhaler Inhale 2 puffs into the lungs 2 (two) times daily.   04/17/2018 at Unknown time  . clotrimazole-betamethasone (LOTRISONE) cream Apply  1 application topically 2 (two) times daily.   04/17/2018 at Unknown time  . diclofenac sodium (VOLTAREN) 1 % GEL Apply 2 g topically 4 (four) times daily.   04/17/2018 at Unknown time  . ondansetron (ZOFRAN) 4 MG tablet   1 04/17/2018 at Unknown time  . pantoprazole (PROTONIX) 40 MG tablet Take 40 mg by mouth daily.   04/17/2018 at Unknown time  . potassium chloride SA (K-DUR,KLOR-CON) 20 MEQ tablet Take 20 mEq by mouth 2 (two) times daily.   04/17/2018 at Unknown time  . sucralfate (CARAFATE) 1 g tablet Take 1 g by mouth 4 (four) times daily.   04/17/2018 at Unknown time  . tiZANidine (ZANAFLEX) 4 MG tablet Take 1 tablet (4 mg total) by mouth 3 (three) times daily. 90 tablet 0 04/17/2018 at Unknown time  . torsemide (DEMADEX) 5 MG tablet   11 04/17/2018 at Unknown time  . venlafaxine (EFFEXOR) 75 MG tablet Take 75 mg by mouth daily.   04/17/2018 at Unknown time  . albuterol (PROVENTIL) (2.5 MG/3ML) 0.083% nebulizer solution Take 2.5 mg by nebulization every 4 (four) hours as needed for wheezing or shortness of breath.   04/16/2018  . Albuterol Sulfate 108 (90 Base) MCG/ACT AEPB Inhale 2 puffs into the lungs every 6 (six) hours as needed.   04/16/2018  . ergocalciferol (VITAMIN D2) 50000 units capsule Take 1 capsule (50,000 Units total) by mouth 2 (two) times a week. X 6 weeks. Take with 600 mg of OTC Calcium 12 capsule 0 Taking  . escitalopram (LEXAPRO) 20 MG tablet Take 20 mg by mouth daily.   04/16/2018  . hydrochlorothiazide (HYDRODIURIL) 25 MG tablet Take 25 mg by mouth daily.   04/16/2018  . HYDROcodone-acetaminophen (NORCO) 10-325 MG tablet Take 1 tablet by mouth every 8 (eight) hours as needed for severe pain. 90 tablet 0 04/16/2018  . ipratropium (ATROVENT) 0.02 % nebulizer solution Take 0.5 mg by nebulization every 4 (four) hours as needed for wheezing or shortness of breath.   04/16/2018  . ketoconazole (NIZORAL) 2 % shampoo Apply 1 application topically 2 (two) times a week.   04/16/2018  . lactulose  (CHRONULAC) 10 GM/15ML solution TK 15 ML PO BID PRN  5 04/16/2018  . levETIRAcetam (KEPPRA) 750 MG tablet Take 750 mg by mouth daily.   04/17/2018  . losartan (COZAAR) 25 MG tablet Take 25 mg by mouth daily.   04/17/2018  .  mirtazapine (REMERON) 15 MG tablet Take 15 mg by mouth at bedtime.   04/16/2018  . nitroGLYCERIN (NITROSTAT) 0.4 MG SL tablet Place 0.4 mg under the tongue every 5 (five) minutes as needed for chest pain.   Not Taking at Unknown time    Patient Stressors: Health problems Marital or family conflict  Patient Strengths: Capable of independent living Motivation for treatment/growth Religious Affiliation  Treatment Modalities: Medication Management, Group therapy, Case management,  1 to 1 session with clinician, Psychoeducation, Recreational therapy.   Physician Treatment Plan for Primary Diagnosis: <principal problem not specified> Long Term Goal(s):     Short Term Goals:    Medication Management: Evaluate patient's response, side effects, and tolerance of medication regimen.  Therapeutic Interventions: 1 to 1 sessions, Unit Group sessions and Medication administration.  Evaluation of Outcomes: Not Progressing  Physician Treatment Plan for Secondary Diagnosis: Active Problems:   Bipolar affective disorder, current episode manic (HCC)  Long Term Goal(s):     Short Term Goals:       Medication Management: Evaluate patient's response, side effects, and tolerance of medication regimen.  Therapeutic Interventions: 1 to 1 sessions, Unit Group sessions and Medication administration.  Evaluation of Outcomes: Not Progressing   RN Treatment Plan for Primary Diagnosis: <principal problem not specified> Long Term Goal(s): Knowledge of disease and therapeutic regimen to maintain health will improve  Short Term Goals: Ability to participate in decision making will improve, Ability to identify and develop effective coping behaviors will improve and Compliance with prescribed  medications will improve  Medication Management: RN will administer medications as ordered by provider, will assess and evaluate patient's response and provide education to patient for prescribed medication. RN will report any adverse and/or side effects to prescribing provider.  Therapeutic Interventions: 1 on 1 counseling sessions, Psychoeducation, Medication administration, Evaluate responses to treatment, Monitor vital signs and CBGs as ordered, Perform/monitor CIWA, COWS, AIMS and Fall Risk screenings as ordered, Perform wound care treatments as ordered.  Evaluation of Outcomes: Not Progressing   LCSW Treatment Plan for Primary Diagnosis: <principal problem not specified> Long Term Goal(s): Safe transition to appropriate next level of care at discharge, Engage patient in therapeutic group addressing interpersonal concerns.  Short Term Goals: Engage patient in aftercare planning with referrals and resources, Increase emotional regulation and Increase skills for wellness and recovery  Therapeutic Interventions: Assess for all discharge needs, 1 to 1 time with Social worker, Explore available resources and support systems, Assess for adequacy in community support network, Educate family and significant other(s) on suicide prevention, Complete Psychosocial Assessment, Interpersonal group therapy.  Evaluation of Outcomes: Not Progressing   Progress in Treatment: Attending groups: Yes. Participating in groups: Yes. Taking medication as prescribed: Yes. Toleration medication: Yes. Family/Significant other contact made: No, will contact:  a support if pt provides consent.  Patient understands diagnosis: Yes. Discussing patient identified problems/goals with staff: Yes. Medical problems stabilized or resolved: Yes. Denies suicidal/homicidal ideation: Yes. Issues/concerns per patient self-inventory: No. Other: None at this time.   New problem(s) identified: No, Describe:  none at this  time.   New Short Term/Long Term Goal(s): Pt reported her goal for tx is to, "relax and get out of here."   Discharge Plan or Barriers: CSW will continue to assess for an appropriate discharge plan.   Reason for Continuation of Hospitalization: Delusions  Medication stabilization  Estimated Length of Stay: 2-4 days  Attendees: Patient: Jean Gomez 04/18/2018 4:02 PM  Physician: Dr. Toni Amend, MD 04/18/2018 4:02 PM  Nursing: Leonia Reader, RN 04/18/2018 4:02 PM  RN Care Manager: 04/18/2018 4:02 PM  Social Worker: Heidi Dach, LCSW  04/18/2018 4:02 PM  Recreational Therapist:  04/18/2018 4:02 PM  Other:  04/18/2018 4:02 PM  Other:  04/18/2018 4:02 PM  Other: 04/18/2018 4:02 PM    Scribe for Treatment Team: Heidi Dach, LCSW 04/18/2018 4:02 PM

## 2018-04-18 NOTE — Progress Notes (Signed)
Nursing note 7a-7p  Pt observed interacting with peers on unit this shift. Displayed a bright affect , upon interaction with this Clinical research associate. Pt c/o   Pain in knees  10/10 Norco administered per MD order at 1127, and it was effective. Pt denies SI/HI, as well as audio or  visual hallucinations at this time. Pt continues to remain safe on the unit, and is continuously monitored 1:1 with sitter at side. No signs or symptoms of pain or distress noted at this time. Pt able to contract for safety and denies further need at this time. RN will continue to monitor.

## 2018-04-18 NOTE — [facility] Group Notes (Signed)
  04/18/2018  Time: 1PM  Type of Therapy/Topic:  Group Therapy:  Balance in Life  Participation Level:  Active  Description of Group:   This group will address the concept of balance and how it feels and looks when one is unbalanced. Patients will be encouraged to process areas in their lives that are out of balance and identify reasons for remaining unbalanced. Facilitators will guide patients in utilizing problem-solving interventions to address and correct the stressor making their life unbalanced. Understanding and applying boundaries will be explored and addressed for obtaining and maintaining a balanced life. Patients will be encouraged to explore ways to assertively make their unbalanced needs known to significant others in their lives, using other group members and facilitator for support and feedback.  Therapeutic Goals: 1. Patient will identify two or more emotions or situations they have that consume much of in their lives. 2. Patient will identify signs/triggers that life has become out of balance:  3. Patient will identify two ways to set boundaries in order to achieve balance in their lives:  4. Patient will demonstrate ability to communicate their needs through discussion and/or role plays  Summary of Patient Progress: Pt continues to work towards their tx goals but has not yet reached them. Pt was able to appropriately participate in group discussion, and was able to offer support/validation to other group members. Pt reported one area of her life she would like to devote more attention to is, "starting my own life," and one area of her life she would like to devote less attention to is, "stop trying to make other people happy."   Therapeutic Modalities:   Cognitive Behavioral Therapy Solution-Focused Therapy Assertiveness Training  Heidi Dach, MSW, LCSW Clinical Social Worker 04/18/2018 1:54 PM

## 2018-04-18 NOTE — Tx Team (Signed)
Initial Treatment Plan 04/18/2018 8:58 AM Jean Gomez ZOX:[id number]    PATIENT STRESSORS: Health problems Marital or family conflict   PATIENT STRENGTHS: Capable of independent living Motivation for treatment/growth Religious Affiliation   PATIENT IDENTIFIED PROBLEMS: Depression  "to get my own place"                   DISCHARGE CRITERIA:  Ability to meet basic life and health needs Adequate post-discharge living arrangements Improved stabilization in mood, thinking, and/or behavior  PRELIMINARY DISCHARGE PLAN: Outpatient therapy Participate in family therapy  PATIENT/FAMILY INVOLVEMENT: This treatment plan has been presented to and reviewed with the patient, Jean Gomez, and/or family member.  The patient and family have been given the opportunity to ask questions and make suggestions.  Janne Lab, RN 04/18/2018, 8:58 AM

## 2018-04-18 NOTE — [facility] Group Notes (Signed)
LCSW Group Therapy Note 04/18/2018 9:00 AM  Type of Therapy and Topic:  Group Therapy:  Setting Goals  Participation Level:  Did Not Attend  Description of Group: In this process group, patients discussed using strengths to work toward goals and address challenges.  Patients identified two positive things about themselves and one goal they were working on.  Patients were given the opportunity to share openly and support each other's plan for self-empowerment.  The group discussed the value of gratitude and were encouraged to have a daily reflection of positive characteristics or circumstances.  Patients were encouraged to identify a plan to utilize their strengths to work on current challenges and goals.  Therapeutic Goals 1. Patient will verbalize personal strengths/positive qualities and relate how these can assist with achieving desired personal goals 2. Patients will verbalize affirmation of peers plans for personal change and goal setting 3. Patients will explore the value of gratitude and positive focus as related to successful achievement of goals 4. Patients will verbalize a plan for regular reinforcement of personal positive qualities and circumstances.  Summary of Patient Progress:  Jean Gomez was invited to today's group, but chose not to attend.     Therapeutic Modalities Cognitive Behavioral Therapy Motivational Interviewing    Alease Frame, [place] 04/18/2018 2:03 PM

## 2018-04-18 NOTE — [facility] Suicide Risk Assessment (Signed)
[facility] Admission Suicide Risk Assessment   Nursing information obtained from:    Demographic factors:    Current Mental Status:    Loss Factors:    Historical Factors:    Risk Reduction Factors:     Total Time spent with patient: 30 minutes Principal Problem: <principal problem not specified> Diagnosis:   Patient Active Problem List   Diagnosis Date Noted  . Bipolar affective disorder, current episode manic (HCC) [F31.9] 04/17/2018  . Chronic hip pain [facility] Area of Pain) (Bilateral) (R>L) [M25.551, M25.552, G89.29] 02/26/2018  . DDD (degenerative disc disease), cervical [M50.30] 02/26/2018  . Cervical facet arthropathy (Bilateral) [U98.119] 02/26/2018  . Cervical facet syndrome (Bilateral) (R>L) [J47.829] 02/26/2018  . Cervical Grade 1 Anterolisthesis of C4 over C5 (Degenerative) [M43.10] 02/26/2018  . Cervical Grade 1 Retrolisthesis of C5 over C6 [M43.10] 02/26/2018  . Cervical foraminal stenosis (C3-4, C4-5, and C5-6) (Bilateral) [M99.81] 02/26/2018  . DDD (degenerative disc disease), lumbar [M51.36] 02/26/2018  . 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 Osteoarthritis of facet joint of lumbar spine [M47.816] 02/26/2018  . Spondylosis without myelopathy or radiculopathy, cervicothoracic region [M47.813] 02/26/2018  . Occipital headache (Right) [R51] 02/25/2018  . Frontal headache (Left) [R51] 02/25/2018  . Chronic low back pain (Midline) (Secondary Area of Pain) [M54.41, G89.29] 02/25/2018  . Chronic lumbar radiculopathy (Right) [M54.16] 02/25/2018  . Osteoarthritis involving multiple joints [M15.0] 02/25/2018  . 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 Chronic musculoskeletal pain [M79.18, G89.29] 02/25/2018  . Lumbar facet joint syndrome (Bilateral) (R>L) [M47.816] 02/25/2018  . Vitamin D deficiency [E55.9] 02/14/2018  . Chronic pain syndrome [G89.4] 02/07/2018  . Pharmacologic therapy [Z79.899] 02/07/2018  . Disorder of skeletal  system [M89.9] 02/07/2018  . Problems influencing health status [Z78.9] 02/07/2018  . Long term current use of opiate analgesic [Z79.891] 02/07/2018  . Chronic low back pain (Bilateral) with right-sided sciatica (Secondary Area of Pain) (R>L) [M54.41, G89.29] 02/07/2018  . Chronic lower extremity pain [facility] Area of Pain) (Bilateral) (R>L) [id number], M79.605, G89.29] 02/07/2018  . Chronic knee pain (Fourth Area of Pain) (Bilateral) (R>L) [M25.561, M25.562, G89.29] 02/07/2018  . Chronic shoulder pain (Fifth Area of Pain) (Bilateral) (R>L) [M25.511, G89.29, M25.512] 02/07/2018  . Occipital neuralgia (Primary Area of Pain) (Bilateral) (R>L) [M54.81] 02/07/2018  . Chronic neck pain (Bilateral) (R>L) [M54.2, G89.29] 02/07/2018  . Chronic sacroiliac joint pain (Right) [M53.3, G89.29] 02/07/2018  . H/O aneurysm [Z86.79] 01/30/2018  . History of depression [Z86.59] 01/30/2018  . History of seizure disorder [Z86.69] 01/30/2018  . Chronic headache disorder (Primary Area of Pain) [R51] 01/30/2018  . Chronic migraine without aura [G43.709] 01/17/2018  . Chronic respiratory failure with hypoxia (HCC) [J96.11] 01/17/2018  . Constipation [K59.00] 01/17/2018  . HTN, goal below 140/90 [I10] 01/17/2018  . Hypercholesterolemia [E78.00] 01/17/2018  . Major depression in remission (HCC) [F32.5] 01/17/2018  . COPD (chronic obstructive pulmonary disease) (HCC) [J44.9] 01/17/2018  . Sarcoidosis [D86.9] 01/17/2018  . Seizure disorder (HCC) [G40.909] 01/17/2018  . Nonruptured cerebral aneurysm [I67.1] 10/22/2017  . Obstructive sleep apnea on CPAP [G47.33, Z99.89] 09/11/2017  . Generalized anxiety disorder [F41.1] 01/26/2017  . Irritable bowel syndrome [K58.9] 01/26/2017  . Pulmonary emphysema (HCC) [J43.9] 01/26/2017  . GERD (gastroesophageal reflux disease) [K21.9] 08/02/2016  . Hiatal hernia [K44.9] 08/02/2016  . Essential tremor [G25.0] 07/19/2016  . Muscle spasm [M62.838] 10/05/2015  . Spondylosis of  lumbar region without myelopathy or radiculopathy [M47.816] 10/05/2015  . Rotator cuff injury [S46.009A] 09/17/2015  . Chronic shoulder pain (Right) [M25.511,  G89.29] 09/17/2015  . Epilepsy (HCC) [G40.909] 07/14/2015  . Insomnia [G47.00] 07/14/2015  . Osteoarthritis of knee (Bilateral) (R>L) [M17.0] 06/24/2015   Subjective Data: 62 year old woman brought to the emergency room under IVC by her daughter.  Reports that she has been acting bizarrely and impulsively and was trying to leave the home in an impulsive manner without proper regard for her safety.  Patient completely denies any suicidal ideation does not appear depressed or hopeless at all.  Continued Clinical Symptoms:  Alcohol Use Disorder Identification Test Final Score (AUDIT): 0 The "Alcohol Use Disorders Identification Test", Guidelines for Use in Primary Care, Second Edition.  World Science writer [facility]). Score between 0-7:  no or low risk or alcohol related problems. Score between 8-15:  moderate risk of alcohol related problems. Score between 16-19:  high risk of alcohol related problems. Score 20 or above:  warrants further diagnostic evaluation for alcohol dependence and treatment.   CLINICAL FACTORS:   Bipolar Disorder:   Mixed State   Musculoskeletal: Strength & Muscle Tone: within normal limits Gait & Station: normal Patient leans: N/A  Psychiatric Specialty Exam: Physical Exam  Nursing note and vitals reviewed. Constitutional: She appears well-developed and well-nourished.  HENT:  Head: Normocephalic and atraumatic.  Eyes: Pupils are equal, round, and reactive to light. Conjunctivae are normal.  Neck: Normal range of motion.  Cardiovascular: Regular rhythm and normal heart sounds.  Respiratory: Effort normal. No respiratory distress.  GI: Soft.  Musculoskeletal: Normal range of motion.  Neurological: She is alert.  Skin: Skin is warm and dry.  Psychiatric: Her affect is angry, labile and  inappropriate. Her speech is rapid and/or pressured. She is agitated. She is not aggressive. Thought content is paranoid. Cognition and memory are impaired. She expresses impulsivity. She expresses no homicidal and no suicidal ideation.    Review of Systems  Constitutional: Negative.   HENT: Negative.   Eyes: Negative.   Respiratory: Negative.   Cardiovascular: Negative.   Gastrointestinal: Negative.   Musculoskeletal: Negative.   Skin: Negative.   Neurological: Negative.   Psychiatric/Behavioral: Negative.     Blood pressure (!) 134/105, pulse (!) 108, temperature 99.2 F (37.3 C), temperature source Oral, resp. rate 18, height  (1.626 m), weight 74.4 kg (164 lb), SpO2 100 %.Body mass index is 28.15 kg/m.  General Appearance: Fairly Groomed  Eye Contact:  Good  Speech:  Pressured  Volume:  Increased  Mood:  Irritable  Affect:  Inappropriate  Thought Process:  Disorganized  Orientation:  Full (Time, Place, and Person)  Thought Content:  Illogical, Delusions and Paranoid Ideation  Suicidal Thoughts:  No  Homicidal Thoughts:  No  Memory:  Immediate;   Fair Recent;   Fair Remote;   Fair  Judgement:  Impaired  Insight:  Shallow  Psychomotor Activity:  Normal  Concentration:  Concentration: Fair  Recall:  [employer] of Knowledge:  Fair  Language:  Fair  Akathisia:  No  Handed:  Right  AIMS (if indicated):     Assets:  Resilience  ADL's:  Intact  Cognition:  WNL  Sleep:         COGNITIVE FEATURES THAT CONTRIBUTE TO RISK:  Closed-mindedness    SUICIDE RISK:   Minimal: No identifiable suicidal ideation.  Patients presenting with no risk factors but with morbid ruminations; may be classified as minimal risk based on the severity of the depressive symptoms  PLAN OF CARE: Patient does not have any suicidal ideation.  No history of suicide.  Affect is more manic than anything.  Patient will nevertheless have continuous reevaluation and assessment prior to discharge  while we try to manage the current mental illness.  I certify that inpatient services furnished can reasonably be expected to improve the patient's condition.   Mordecai Rasmussen, MD 04/18/2018, 7:57 PM

## 2018-04-18 NOTE — Plan of Care (Signed)
Pt alert and oriented. Pt stated she was sitting out in dayroom because she knows that's what she needs to do. Pt compliant with medications. Pt denies SI/HI. Pt said she constantly has back pain, given some norco 5-325 mg at 2225. Pt stated she was only here because she told her daughter she was leaving. Pt also indicated she likes to do as much for herself as possible. Sitter with patient and she is in her room at this time. No other complaints at this time. Will continue to monitor.

## 2018-04-18 NOTE — Progress Notes (Signed)
Spoke with Dr Toni Amend via telephone. RN requesting that Pt be put on 1:1 due to need of O2 and cannula/ CPAP. T.O. Received at this time pt to be placed on 1:1 monitoring. Staff at bedside. RN will continue to monitor.

## 2018-04-19 DIAGNOSIS — F3113 Bipolar disorder, current episode manic without psychotic features, severe: Secondary | ICD-10-CM

## 2018-04-19 MED ORDER — POTASSIUM CHLORIDE CRYS ER 20 MEQ PO TBCR: 20.0000 meq | EXTENDED_RELEASE_TABLET | Freq: Two times a day (BID) | ORAL | 0 refills | Status: DC

## 2018-04-19 MED ORDER — VENLAFAXINE HCL 75 MG PO TABS: 75.0000 mg | ORAL_TABLET | Freq: Every day | ORAL | 0 refills | Status: DC

## 2018-04-19 MED ORDER — ATORVASTATIN CALCIUM 10 MG PO TABS: 10.0000 mg | ORAL_TABLET | Freq: Every day | ORAL | 0 refills | Status: DC

## 2018-04-19 MED ORDER — AMLODIPINE BESYLATE 10 MG PO TABS: 10.0000 mg | ORAL_TABLET | Freq: Every day | ORAL | 0 refills | Status: DC

## 2018-04-19 MED ORDER — LOSARTAN POTASSIUM 25 MG PO TABS: 25.0000 mg | ORAL_TABLET | Freq: Every day | ORAL | 0 refills | Status: DC

## 2018-04-19 MED ORDER — LEVETIRACETAM 750 MG PO TABS: 750.0000 mg | ORAL_TABLET | Freq: Every day | ORAL | 0 refills | Status: DC

## 2018-04-19 MED ORDER — ESCITALOPRAM OXALATE 20 MG PO TABS: 20.0000 mg | ORAL_TABLET | Freq: Every day | ORAL | 0 refills | Status: DC

## 2018-04-19 MED ORDER — PANTOPRAZOLE SODIUM 40 MG PO TBEC: 40.0000 mg | DELAYED_RELEASE_TABLET | Freq: Every day | ORAL | 0 refills | Status: DC

## 2018-04-19 MED ORDER — HYDROCHLOROTHIAZIDE 25 MG PO TABS: 25.0000 mg | ORAL_TABLET | Freq: Every day | ORAL | 0 refills | Status: DC

## 2018-04-19 MED ORDER — SUCRALFATE 1 G PO TABS: 1.0000 g | ORAL_TABLET | Freq: Four times a day (QID) | ORAL | 0 refills | Status: DC

## 2018-04-19 MED ORDER — QUETIAPINE FUMARATE 100 MG PO TABS: 100.0000 mg | ORAL_TABLET | Freq: Every day | ORAL | 0 refills | Status: DC

## 2018-04-19 MED ORDER — ASPIRIN EC 81 MG PO TBEC: 81.0000 mg | DELAYED_RELEASE_TABLET | Freq: Every day | ORAL | 0 refills | Status: AC

## 2018-04-19 MED ORDER — CLOTRIMAZOLE-BETAMETHASONE 1-0.05 % EX CREA: 1.0000 "application " | TOPICAL_CREAM | Freq: Two times a day (BID) | CUTANEOUS | 0 refills | Status: DC

## 2018-04-19 NOTE — [facility] Suicide Risk Assessment (Signed)
[facility] Discharge Suicide Risk Assessment   Principal Problem: Bipolar affective disorder, current episode manic [facility]) Discharge Diagnoses:  Patient Active Problem List   Diagnosis Date Noted  . Bipolar affective disorder, current episode manic (HCC) [F31.9] 04/17/2018  . Chronic hip pain [facility] Area of Pain) (Bilateral) (R>L) [M25.551, M25.552, G89.29] 02/26/2018  . DDD (degenerative disc disease), cervical [M50.30] 02/26/2018  . Cervical facet arthropathy (Bilateral) [Z61.096] 02/26/2018  . Cervical facet syndrome (Bilateral) (R>L) [E45.409] 02/26/2018  . Cervical Grade 1 Anterolisthesis of C4 over C5 (Degenerative) [M43.10] 02/26/2018  . Cervical Grade 1 Retrolisthesis of C5 over C6 [M43.10] 02/26/2018  . Cervical foraminal stenosis (C3-4, C4-5, and C5-6) (Bilateral) [M99.81] 02/26/2018  . DDD (degenerative disc disease), lumbar [M51.36] 02/26/2018  . 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 Osteoarthritis of facet joint of lumbar spine [M47.816] 02/26/2018  . Spondylosis without myelopathy or radiculopathy, cervicothoracic region [M47.813] 02/26/2018  . Occipital headache (Right) [R51] 02/25/2018  . Frontal headache (Left) [R51] 02/25/2018  . Chronic low back pain (Midline) (Secondary Area of Pain) [M54.41, G89.29] 02/25/2018  . Chronic lumbar radiculopathy (Right) [M54.16] 02/25/2018  . Osteoarthritis involving multiple joints [M15.0] 02/25/2018  . 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 Chronic musculoskeletal pain [M79.18, G89.29] 02/25/2018  . Lumbar facet joint syndrome (Bilateral) (R>L) [M47.816] 02/25/2018  . Vitamin D deficiency [E55.9] 02/14/2018  . Chronic pain syndrome [G89.4] 02/07/2018  . Pharmacologic therapy [Z79.899] 02/07/2018  . Disorder of skeletal system [M89.9] 02/07/2018  . Problems influencing health status [Z78.9] 02/07/2018  . Long term current use of opiate analgesic [Z79.891] 02/07/2018  . Chronic low back  pain (Bilateral) with right-sided sciatica (Secondary Area of Pain) (R>L) [M54.41, G89.29] 02/07/2018  . Chronic lower extremity pain [facility] Area of Pain) (Bilateral) (R>L) [id number], M79.605, G89.29] 02/07/2018  . Chronic knee pain (Fourth Area of Pain) (Bilateral) (R>L) [M25.561, M25.562, G89.29] 02/07/2018  . Chronic shoulder pain (Fifth Area of Pain) (Bilateral) (R>L) [M25.511, G89.29, M25.512] 02/07/2018  . Occipital neuralgia (Primary Area of Pain) (Bilateral) (R>L) [M54.81] 02/07/2018  . Chronic neck pain (Bilateral) (R>L) [M54.2, G89.29] 02/07/2018  . Chronic sacroiliac joint pain (Right) [M53.3, G89.29] 02/07/2018  . H/O aneurysm [Z86.79] 01/30/2018  . History of depression [Z86.59] 01/30/2018  . History of seizure disorder [Z86.69] 01/30/2018  . Chronic headache disorder (Primary Area of Pain) [R51] 01/30/2018  . Chronic migraine without aura [G43.709] 01/17/2018  . Chronic respiratory failure with hypoxia (HCC) [J96.11] 01/17/2018  . Constipation [K59.00] 01/17/2018  . HTN, goal below 140/90 [I10] 01/17/2018  . Hypercholesterolemia [E78.00] 01/17/2018  . Major depression in remission (HCC) [F32.5] 01/17/2018  . COPD (chronic obstructive pulmonary disease) (HCC) [J44.9] 01/17/2018  . Sarcoidosis [D86.9] 01/17/2018  . Seizure disorder (HCC) [G40.909] 01/17/2018  . Nonruptured cerebral aneurysm [I67.1] 10/22/2017  . Obstructive sleep apnea on CPAP [G47.33, Z99.89] 09/11/2017  . Generalized anxiety disorder [F41.1] 01/26/2017  . Irritable bowel syndrome [K58.9] 01/26/2017  . Pulmonary emphysema (HCC) [J43.9] 01/26/2017  . GERD (gastroesophageal reflux disease) [K21.9] 08/02/2016  . Hiatal hernia [K44.9] 08/02/2016  . Essential tremor [G25.0] 07/19/2016  . Muscle spasm [M62.838] 10/05/2015  . Spondylosis of lumbar region without myelopathy or radiculopathy [M47.816] 10/05/2015  . Rotator cuff injury [S46.009A] 09/17/2015  . Chronic shoulder pain (Right) [M25.511, G89.29]  09/17/2015  . Epilepsy (HCC) [G40.909] 07/14/2015  . Insomnia [G47.00] 07/14/2015  . Osteoarthritis of knee (Bilateral) (R>L) [M17.0] 06/24/2015    Total Time spent with patient: 45 minutes  Musculoskeletal: Strength & Muscle Tone: within  normal limits Gait & Station: normal Patient leans: N/A  Psychiatric Specialty Exam: Review of Systems  Constitutional: Negative.   HENT: Negative.   Eyes: Negative.   Respiratory: Negative.   Cardiovascular: Negative.   Gastrointestinal: Negative.   Musculoskeletal: Negative.   Skin: Negative.   Neurological: Negative.   Psychiatric/Behavioral: Negative.     Blood pressure (!) 131/95, pulse (!) 116, temperature 98.8 F (37.1 C), temperature source Oral, resp. rate 18, height  (1.626 m), weight 74.4 kg (164 lb), SpO2 100 %.Body mass index is 28.15 kg/m.  General Appearance: Fairly Groomed  Patent attorney::  Good  Speech:  Clear and Coherent409  Volume:  Normal  Mood:  Euthymic  Affect:  Congruent  Thought Process:  Goal Directed  Orientation:  Full (Time, Place, and Person)  Thought Content:  Logical  Suicidal Thoughts:  No  Homicidal Thoughts:  No  Memory:  Immediate;   Fair Recent;   Fair Remote;   Fair  Judgement:  Fair  Insight:  Fair  Psychomotor Activity:  Decreased  Concentration:  Fair  Recall:  [employer] of Knowledge:Fair  Language: Fair  Akathisia:  No  Handed:  Right  AIMS (if indicated):     Assets:  Desire for Improvement Resilience  Sleep:  Number of Hours: 4.3  Cognition: WNL  ADL's:  Intact   Mental Status Per Nursing Assessment::   On Admission:     Demographic Factors:  Living alone  Loss Factors: Financial problems/change in socioeconomic status  Historical Factors: NA  Risk Reduction Factors:   Religious beliefs about death and Positive coping skills or problem solving skills  Continued Clinical Symptoms:  Bipolar Disorder:   Bipolar II  Cognitive Features That Contribute To Risk:   Closed-mindedness    Suicide Risk:  Minimal: No identifiable suicidal ideation.  Patients presenting with no risk factors but with morbid ruminations; may be classified as minimal risk based on the severity of the depressive symptoms  Follow-up Information    [facility], [employer]. Go on 04/22/2018.   Why:  Please go to your hospital follow up appointment with [facility] on Monday, 04/22/18 at 8AM. Thank you! Contact information: [address] [place] [address] [phone]           Plan Of Care/Follow-up recommendations:  Activity:  Activity as tolerated Diet:  Heart healthy diet Other:  Recommend follow-up with local mental health or primary provider  Mordecai Rasmussen, MD 04/19/2018, 12:48 PM

## 2018-04-19 NOTE — Progress Notes (Signed)
CSW received a call back from Mr. Chestine Spore at [phone]. Mr. Chestine Spore confirmed pt will be renting a room in his home upon discharge, and that he will pick pt up when she is ready to be discharged. CSW explained that, at this time, we do not have a projected discharge date. Mr. Chestine Spore expressed understanding with this information. Pt's friend was not able to provide any collateral information regarding pt's baseline or previous mental health encounters. CSW will continue to coordinate with Mr. Chestine Spore as needed for further updates and discharge planning.   Heidi Dach, MSW, LCSW Clinical Social Worker 04/19/2018 8:47 AM

## 2018-04-19 NOTE — Progress Notes (Signed)
Received Jean Gomez this AM after breakfast, she was compliant with her medications. She denied all of the psychiatric symptoms and stated her home situation  Is related to her stress. She is on continuous O2  Via nasal cannula and a sitter is at the bedside. Later she received her discharge order, the AVS was reviewed and her questions answered. She received her medications and prescriptions. Her personal items were returned and money. She was discharged without incident with her friend who will be renting her a room.

## 2018-04-19 NOTE — Progress Notes (Signed)
Recreation Therapy Notes  Date: 04/19/2018  Time: 9:30 am   Location: Craft Room   Behavioral response: N/A   Intervention Topic: Leisure  Discussion/Intervention: Patient did not attend group.   Clinical Observations/Feedback:  Patient did not attend group.   Capricia Serda LRT/CTRS         Rashae Rother 04/19/2018 10:26 AM

## 2018-04-19 NOTE — [facility] Counselor (Signed)
04/19/2018 04/19/2018 1PM  Type of Therapy and Topic:  Group Therapy:  Feelings around Relapse and Recovery  Participation Level:  Did Not Attend   Description of Group:    Patients in this group will discuss emotions they experience before and after a relapse. They will process how experiencing these feelings, or avoidance of experiencing them, relates to having a relapse. Facilitator will guide patients to explore emotions they have related to recovery. Patients will be encouraged to process which emotions are more powerful. They will be guided to discuss the emotional reaction significant others in their lives may have to patients' relapse or recovery. Patients will be assisted in exploring ways to respond to the emotions of others without this contributing to a relapse.  Therapeutic Goals: 1. Patient will identify two or more emotions that lead to a relapse for them 2. Patient will identify two emotions that result when they relapse 3. Patient will identify two emotions related to recovery 4. Patient will demonstrate ability to communicate their needs through discussion and/or role plays   Summary of Patient Progress:     Therapeutic Modalities:   Cognitive Behavioral Therapy Solution-Focused Therapy Assertiveness Training Relapse Prevention Therapy   Johny Shears, LCSW 04/19/2018 1:27 PM     Type of Therapy/Topic:  Group Therapy:  Feelings about Diagnosis  Participation Level:  Did Not Attend   Description of Group:   This group will allow patients to explore their thoughts and feelings about diagnoses they have received. Patients will be guided to explore their level of understanding and acceptance of these diagnoses. Facilitator will encourage patients to process their thoughts and feelings about the reactions of others to their diagnosis and will guide patients in identifying ways to discuss their diagnosis with significant others in their lives. This group will be  process-oriented, with patients participating in exploration of their own experiences, giving and receiving support, and processing challenge from other group members.   Therapeutic Goals: 1. Patient will demonstrate understanding of diagnosis as evidenced by identifying two or more symptoms of the disorder 2. Patient will be able to express two feelings regarding the diagnosis 3. Patient will demonstrate their ability to communicate their needs through discussion and/or role play  Summary of Patient Progress: Patient was encouraged and invited to attend group. Patient did not attend group. Social worker will continue to encourage group participation in the future.        Therapeutic Modalities:   Cognitive Behavioral Therapy Brief Therapy Feelings Identification    Johny Shears, LCSW 04/19/2018 1:27 PM

## 2018-04-19 NOTE — Progress Notes (Signed)
CSW attempted to contact pt's friend, Mr. Chestine Spore at [phone], whom pt states is going to allow her to rent a room upon discharge. Mr. Chestine Spore did not answer, so CSW left a voicemail asking him to call back ASAP. CSW will reattempt contact with Mr. Chestine Spore again today.   Heidi Dach, MSW, LCSW Clinical Social Worker 04/19/2018 8:06 AM

## 2018-04-19 NOTE — Progress Notes (Signed)
[facility] MD Progress Note  04/19/2018 12:45 PM Jean Gomez  MRN:  [id number] Subjective: Follow-up note for this 62 year old woman with possible bipolar disorder.  Patient has no new complaints today.  Her behavior has been generally calm and compliant.  Does not appear to be acting out or psychotic on the unit.  Does not appear to be acutely dangerous at this time.  Medically she appears to be stable with her current treatment.  The social workers spoke with the person she intends to live with and confirmed that that is an appropriate disposition. Principal Problem: Bipolar affective disorder, current episode manic ([place]) Diagnosis:   Patient Active Problem List   Diagnosis Date Noted  . Bipolar affective disorder, current episode manic ([place]) [F31.9] 04/17/2018  . Chronic hip pain [facility] Area of Pain) (Bilateral) (R>L) [M25.551, M25.552, G89.29] 02/26/2018  . DDD (degenerative disc disease), cervical [M50.30] 02/26/2018  . Cervical facet arthropathy (Bilateral) [Z36.644] 02/26/2018  . Cervical facet syndrome (Bilateral) (R>L) [I34.742] 02/26/2018  . Cervical Grade 1 Anterolisthesis of C4 over C5 (Degenerative) [M43.10] 02/26/2018  . Cervical Grade 1 Retrolisthesis of C5 over C6 [M43.10] 02/26/2018  . Cervical foraminal stenosis (C3-4, C4-5, and C5-6) (Bilateral) [M99.81] 02/26/2018  . DDD (degenerative disc disease), lumbar [M51.36] 02/26/2018  . 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 Osteoarthritis of facet joint of lumbar spine [M47.816] 02/26/2018  . Spondylosis without myelopathy or radiculopathy, cervicothoracic region [M47.813] 02/26/2018  . Occipital headache (Right) [R51] 02/25/2018  . Frontal headache (Left) [R51] 02/25/2018  . Chronic low back pain (Midline) (Secondary Area of Pain) [M54.41, G89.29] 02/25/2018  . Chronic lumbar radiculopathy (Right) [M54.16] 02/25/2018  . Osteoarthritis involving multiple joints [M15.0] 02/25/2018  . Osteoarthritis of shoulder  (Bilateral) [M19.011, M19.012] 02/25/2018  . Chronic musculoskeletal pain [M79.18, G89.29] 02/25/2018  . Lumbar facet joint syndrome (Bilateral) (R>L) [M47.816] 02/25/2018  . Vitamin D deficiency [E55.9] 02/14/2018  . Chronic pain syndrome [G89.4] 02/07/2018  . Pharmacologic therapy [Z79.899] 02/07/2018  . Disorder of skeletal system [M89.9] 02/07/2018  . Problems influencing health status [Z78.9] 02/07/2018  . Long term current use of opiate analgesic [Z79.891] 02/07/2018  . Chronic low back pain (Bilateral) with right-sided sciatica (Secondary Area of Pain) (R>L) [M54.41, G89.29] 02/07/2018  . Chronic lower extremity pain [facility] Area of Pain) (Bilateral) (R>L) [id number], M79.605, G89.29] 02/07/2018  . Chronic knee pain (Fourth Area of Pain) (Bilateral) (R>L) [M25.561, M25.562, G89.29] 02/07/2018  . Chronic shoulder pain (Fifth Area of Pain) (Bilateral) (R>L) [M25.511, G89.29, M25.512] 02/07/2018  . Occipital neuralgia (Primary Area of Pain) (Bilateral) (R>L) [M54.81] 02/07/2018  . Chronic neck pain (Bilateral) (R>L) [M54.2, G89.29] 02/07/2018  . Chronic sacroiliac joint pain (Right) [M53.3, G89.29] 02/07/2018  . H/O aneurysm [Z86.79] 01/30/2018  . History of depression [Z86.59] 01/30/2018  . History of seizure disorder [Z86.69] 01/30/2018  . Chronic headache disorder (Primary Area of Pain) [R51] 01/30/2018  . Chronic migraine without aura [G43.709] 01/17/2018  . Chronic respiratory failure with hypoxia ([place]) [J96.11] 01/17/2018  . Constipation [K59.00] 01/17/2018  . HTN, goal below 140/90 [I10] 01/17/2018  . Hypercholesterolemia [E78.00] 01/17/2018  . Major depression in remission ([place]) [F32.5] 01/17/2018  . COPD (chronic obstructive pulmonary disease) ([place]) [J44.9] 01/17/2018  . Sarcoidosis [D86.9] 01/17/2018  . Seizure disorder ([place]) [V95.638] 01/17/2018  . Nonruptured cerebral aneurysm [I67.1] 10/22/2017  . Obstructive sleep apnea on CPAP [G47.33, Z99.89] 09/11/2017  .  Generalized anxiety disorder [F41.1] 01/26/2017  . Irritable bowel syndrome [K58.9] 01/26/2017  . Pulmonary emphysema ([place]) [J43.9] 01/26/2017  .  GERD (gastroesophageal reflux disease) [K21.9] 08/02/2016  . Hiatal hernia [K44.9] 08/02/2016  . Essential tremor [G25.0] 07/19/2016  . Muscle spasm [M62.838] 10/05/2015  . Spondylosis of lumbar region without myelopathy or radiculopathy [M47.816] 10/05/2015  . Rotator cuff injury [S46.009A] 09/17/2015  . Chronic shoulder pain (Right) [M25.511, G89.29] 09/17/2015  . Epilepsy ([place]) [I09.735] 07/14/2015  . Insomnia [G47.00] 07/14/2015  . Osteoarthritis of knee (Bilateral) (R>L) [M17.0] 06/24/2015   Total Time spent with patient: 20 minutes  Past Psychiatric History: No known past psychiatric history other than perhaps mild outpatient anxiety  Past Medical History:  Past Medical History:  Diagnosis Date  . Asthma   . Cerebral aneurysm   . Chest pain 11/01/2016  . Chronic migraine   . Chronic respiratory failure ([place])   . COPD (chronic obstructive pulmonary disease) ([place])   . Hypertension   . Osteoarthritis   . Sarcoid   . Seizures ([place])     Past Surgical History:  Procedure Laterality Date  . ABDOMINAL HYSTERECTOMY    . CHOLECYSTECTOMY    . SPINAL FUSION     Family History: History reviewed. No pertinent family history. Family Psychiatric  History: Unknown Social History:  Social History   Substance and Sexual Activity  Alcohol Use Yes   Comment: occasional     Social History   Substance and Sexual Activity  Drug Use No    Social History   Socioeconomic History  . Marital status: Single    Spouse name: Not on file  . Number of children: Not on file  . Years of education: Not on file  . Highest education level: Not on file  Occupational History  . Not on file  Social Needs  . Financial resource strain: Not very hard  . Food insecurity:    Worry: Sometimes true    Inability: Sometimes true  . Transportation  needs:    Medical: Yes    Non-medical: Yes  Tobacco Use  . Smoking status: Never Smoker  . Smokeless tobacco: Never Used  Substance and Sexual Activity  . Alcohol use: Yes    Comment: occasional  . Drug use: No  . Sexual activity: Not on file  Lifestyle  . Physical activity:    Days per week: Patient refused    Minutes per session: Patient refused  . Stress: Only a little  Relationships  . Social connections:    Talks on phone: Patient refused    Gets together: Patient refused    Attends religious service: Patient refused    Active member of club or organization: Patient refused    Attends meetings of clubs or organizations: Patient refused    Relationship status: Patient refused  Other Topics Concern  . Not on file  Social History Narrative  . Not on file   Additional Social History:  Specify valuables returned: See valuable form                      Sleep: Fair  Appetite:  Fair  Current Medications: Current Facility-Administered Medications  Medication Dose Route Frequency Provider Last Rate Last Dose  . acetaminophen (TYLENOL) tablet 650 mg  650 mg Oral Q6H PRN Trystan Akhtar T, MD      . albuterol (PROVENTIL) (2.5 MG/3ML) 0.083% nebulizer solution 2.5 mg  2.5 mg Nebulization BID Branch Pacitti, Madie Reno, MD   2.5 mg at 04/19/18 0939  . alum & mag hydroxide-simeth (MAALOX/MYLANTA) 200-200-20 MG/5ML suspension 30 mL  30 mL Oral Q4H PRN Ruthetta Koopmann,  Madie Reno, MD      . amLODipine (NORVASC) tablet 10 mg  10 mg Oral Daily Tycho Cheramie, Madie Reno, MD   10 mg at 04/19/18 0909  . aspirin EC tablet 81 mg  81 mg Oral Daily Sunny Aguon, Madie Reno, MD   81 mg at 04/19/18 0912  . atorvastatin (LIPITOR) tablet 10 mg  10 mg Oral Daily Jillana Selph, Madie Reno, MD   10 mg at 04/19/18 0912  . clotrimazole (LOTRIMIN) 1 % cream 1 application  1 application Topical BID Soha Thorup, Madie Reno, MD   1 application at 97/35/32 [phone]  . diclofenac sodium (VOLTAREN) 1 % transdermal gel 2 g  2 g Topical QID Johnita Palleschi, Madie Reno, MD   2  g at 04/19/18 0935  . escitalopram (LEXAPRO) tablet 20 mg  20 mg Oral Daily Ladene Allocca, Madie Reno, MD   20 mg at 04/19/18 0914  . feeding supplement (ENSURE ENLIVE) (ENSURE ENLIVE) liquid 237 mL  237 mL Oral BID BM Shaquasha Gerstel T, MD   237 mL at 04/18/18 1352  . fluticasone furoate-vilanterol (BREO ELLIPTA) 100-25 MCG/INH 1 puff  1 puff Inhalation Daily Shikara Mcauliffe, Madie Reno, MD   1 puff at 04/19/18 0926  . hydrochlorothiazide (HYDRODIURIL) tablet 25 mg  25 mg Oral Daily Amanda Steuart, Madie Reno, MD   25 mg at 04/19/18 0911  . HYDROcodone-acetaminophen (NORCO/VICODIN) 5-325 MG per tablet 2 tablet  2 tablet Oral Q8H PRN Kyan Yurkovich, Madie Reno, MD   2 tablet at 04/19/18 1157  . levETIRAcetam (KEPPRA) tablet 750 mg  750 mg Oral Daily Aydon Swamy, Madie Reno, MD   750 mg at 04/19/18 0918  . losartan (COZAAR) tablet 25 mg  25 mg Oral Daily Shruthi Northrup, Madie Reno, MD   25 mg at 04/19/18 0920  . magnesium hydroxide (MILK OF MAGNESIA) suspension 30 mL  30 mL Oral Daily PRN Krystian Ferrentino T, MD      . magnesium oxide (MAG-OX) tablet 400 mg  400 mg Oral BID Jesly Hartmann, Madie Reno, MD   400 mg at 04/19/18 0919  . multivitamin with minerals tablet 1 tablet  1 tablet Oral Daily Brandis Wixted, Madie Reno, MD   1 tablet at 04/19/18 0914  . pantoprazole (PROTONIX) EC tablet 40 mg  40 mg Oral Daily Athleen Feltner, Madie Reno, MD   40 mg at 04/19/18 0914  . potassium chloride SA (K-DUR,KLOR-CON) CR tablet 40 mEq  40 mEq Oral BID Shellene Sweigert, Madie Reno, MD   40 mEq at 04/19/18 0910  . QUEtiapine (SEROQUEL) tablet 100 mg  100 mg Oral QHS Naven Giambalvo T, MD   100 mg at 04/18/18 2223  . sucralfate (CARAFATE) tablet 1 g  1 g Oral QID Mishawn Hemann, Madie Reno, MD   1 g at 04/19/18 1157  . tiZANidine (ZANAFLEX) tablet 4 mg  4 mg Oral Q8H PRN Gittel Mccamish, Madie Reno, MD   4 mg at 04/17/18 2114  . torsemide (DEMADEX) tablet 5 mg  5 mg Oral Daily Trystin Terhune, Madie Reno, MD   5 mg at 04/19/18 0916  . venlafaxine [facility]) tablet 75 mg  75 mg Oral Daily Yohann Curl, Madie Reno, MD   75 mg at 04/19/18 2683    Lab Results:  Results  for orders placed or performed during the hospital encounter of 04/17/18 (from the past 48 hour(s))  Basic metabolic panel     Status: Abnormal   Collection Time: 04/18/18  6:34 AM  Result Value Ref Range   Sodium 136 135 - 145 mmol/L   Potassium 3.7 3.5 -  5.1 mmol/L   Chloride 98 (L) 101 - 111 mmol/L   CO2 29 22 - 32 mmol/L   Glucose, Bld 97 65 - 99 mg/dL   BUN 10 6 - 20 mg/dL   Creatinine, Ser 0.60 0.44 - 1.00 mg/dL   Calcium 9.2 8.9 - 10.3 mg/dL   GFR calc non Af Amer >60 >60 mL/min   GFR calc Af Amer >60 >60 mL/min    Comment: (NOTE) The eGFR has been calculated using the CKD EPI equation. This calculation has not been validated in all clinical situations. eGFR's persistently <60 mL/min signify possible Chronic Kidney Disease.    Anion gap 9 5 - 15    Comment: Performed at [facility], [address] [place], [place] [address]  Magnesium     Status: None   Collection Time: 04/18/18  6:34 AM  Result Value Ref Range   Magnesium 1.8 1.7 - 2.4 mg/dL    Comment: Performed at [facility], [address] [place], [place] [address]  Hemoglobin A1c     Status: Abnormal   Collection Time: 04/18/18  6:34 AM  Result Value Ref Range   Hgb A1c MFr Bld 5.9 (H) 4.8 - 5.6 %    Comment: (NOTE) Pre diabetes:          5.7%-6.4% Diabetes:              >6.4% Glycemic control for   <7.0% adults with diabetes    Mean Plasma Glucose 122.63 mg/dL    Comment: Performed at [facility], [place] [address] [place], [place] [address]  Lipid panel     Status: None   Collection Time: 04/18/18  6:34 AM  Result Value Ref Range   Cholesterol 154 0 - 200 mg/dL   Triglycerides 44 <150 mg/dL   HDL 64 >40 mg/dL   Total CHOL/HDL Ratio 2.4 RATIO   VLDL 9 0 - 40 mg/dL   LDL Cholesterol 81 0 - 99 mg/dL    Comment:        Total Cholesterol/HDL:CHD Risk Coronary Heart Disease Risk Table                     Men   Women  1/2 Average Risk   3.4   3.3  Average Risk       5.0    4.4  2 X Average Risk   9.6   7.1  3 X Average Risk  23.4   11.0        Use the calculated Patient Ratio above and the CHD Risk Table to determine the patient's CHD Risk.        ATP III CLASSIFICATION (LDL):  <100     mg/dL   Optimal  100-129  mg/dL   Near or Above                    Optimal  130-159  mg/dL   Borderline  160-189  mg/dL   High  >190     mg/dL   Very High Performed at [facility], [address] [place], [place] [address]   TSH     Status: None   Collection Time: 04/18/18  6:34 AM  Result Value Ref Range   TSH 0.958 0.350 - 4.500 uIU/mL    Comment: Performed by a 3rd Generation assay with a functional sensitivity of <=0.01 uIU/mL. Performed at [facility], [address] [place], [place] [address]  Blood Alcohol level:  Lab Results  Component Value Date   ETH <10 58/30/9407    Metabolic Disorder Labs: Lab Results  Component Value Date   HGBA1C 5.9 (H) 04/18/2018   MPG 122.63 04/18/2018   No results found for: PROLACTIN Lab Results  Component Value Date   CHOL 154 04/18/2018   TRIG 44 04/18/2018   HDL 64 04/18/2018   CHOLHDL 2.4 04/18/2018   VLDL 9 04/18/2018   LDLCALC 81 04/18/2018    Physical Findings: AIMS: Facial and Oral Movements Muscles of Facial Expression: None, normal Lips and Perioral Area: None, normal Jaw: None, normal Tongue: None, normal,Extremity Movements Upper (arms, wrists, hands, fingers): None, normal Lower (legs, knees, ankles, toes): None, normal, Trunk Movements Neck, shoulders, hips: None, normal, Overall Severity Severity of abnormal movements (highest score from questions above): None, normal Incapacitation due to abnormal movements: None, normal Patient's awareness of abnormal movements (rate only patient's report): No Awareness, Dental Status Current problems with teeth and/or dentures?: Yes(dentures ) Does patient usually wear dentures?: Yes  CIWA:    COWS:      Musculoskeletal: Strength & Muscle Tone: within normal limits Gait & Station: normal Patient leans: N/A  Psychiatric Specialty Exam: Physical Exam  Nursing note and vitals reviewed. Constitutional: She appears well-developed and well-nourished.  HENT:  Head: Normocephalic and atraumatic.  Eyes: Pupils are equal, round, and reactive to light. Conjunctivae are normal.  Neck: Normal range of motion.  Cardiovascular: Regular rhythm and normal heart sounds.  Respiratory: Effort normal. No respiratory distress.  GI: Soft.  Musculoskeletal: Normal range of motion.  Neurological: She is alert.  Skin: Skin is warm and dry.  Psychiatric: She has a normal mood and affect. Her speech is normal and behavior is normal. Thought content is paranoid. Cognition and memory are normal. She expresses impulsivity. She expresses no homicidal and no suicidal ideation.    Review of Systems  Constitutional: Negative.   HENT: Negative.   Eyes: Negative.   Respiratory: Negative.   Cardiovascular: Negative.   Gastrointestinal: Negative.   Musculoskeletal: Negative.   Skin: Negative.   Neurological: Negative.   Psychiatric/Behavioral: Negative.     Blood pressure (!) 131/95, pulse (!) 116, temperature 98.8 F (37.1 C), temperature source Oral, resp. rate 18, height '5\' 4"'$  (1.626 m), weight 74.4 kg (164 lb), SpO2 100 %.Body mass index is 28.15 kg/m.  General Appearance: Fairly Groomed  Eye Contact:  Good  Speech:  Clear and Coherent  Volume:  Normal  Mood:  Euthymic  Affect:  Congruent  Thought Process:  Goal Directed  Orientation:  Full (Time, Place, and Person)  Thought Content:  Logical  Suicidal Thoughts:  No  Homicidal Thoughts:  No  Memory:  Immediate;   Fair Recent;   Fair Remote;   Fair  Judgement:  Fair  Insight:  Shallow  Psychomotor Activity:  Normal  Concentration:  Concentration: Fair  Recall:  [employer] of Knowledge:  Fair  Language:  Fair  Akathisia:  No  Handed:  Right   AIMS (if indicated):     Assets:  Desire for Improvement Resilience  ADL's:  Intact  Cognition:  WNL  Sleep:  Number of Hours: 4.3     Treatment Plan Summary: Daily contact with patient to assess and evaluate symptoms and progress in treatment, Medication management and Plan Patient appears to have stabilized and no longer meet commitment criteria.  Patient will be discharged from the hospital with current medication in place and referred for follow-up  with her local medical or mental health provider.  Psychoeducation about mood stability completed.  Case reviewed with treatment team.  Alethia Berthold, MD 04/19/2018, 12:45 PM

## 2018-04-19 NOTE — Progress Notes (Signed)
  [facility] Adult Case Management Discharge Plan :  Will you be returning to the same living situation after discharge:  No. At discharge, do you have transportation home?: Yes,  Mr. Chestine Spore Do you have the ability to pay for your medications: Yes,  insurance  Release of information consent forms completed and in the chart;  Patient's signature needed at discharge.  Patient to Follow up at: Follow-up Information    [facility], [employer]. Go on 04/22/2018.   Why:  Please go to your hospital follow up appointment with [facility] on Monday, 04/22/18 at 8AM. Thank you! Contact information: [address] [place] [address] [phone]           Next level of care provider has access to [facility] Link:no  Safety Planning and Suicide Prevention discussed: Yes,  with pt. Pt declined to involve other supports in her tx.    Have you used any form of tobacco in the last 30 days? (Cigarettes, Smokeless Tobacco, Cigars, and/or Pipes): No  Has patient been referred to the Quitline?: N/A patient is not a smoker  Patient has been referred for addiction treatment: N/A  Heidi Dach, LCSW 04/19/2018, 9:14 AM

## 2018-04-24 ENCOUNTER — Other Ambulatory Visit: Payer: Self-pay | Admitting: Nurse Practitioner

## 2018-04-24 ENCOUNTER — Ambulatory Visit: Payer: Medicare Other | Attending: Nurse Practitioner | Admitting: Nurse Practitioner

## 2018-04-24 ENCOUNTER — Other Ambulatory Visit: Payer: Self-pay

## 2018-04-24 ENCOUNTER — Encounter: Payer: Self-pay | Admitting: Nurse Practitioner

## 2018-04-24 VITALS — BP 109/99 | HR 106 | Temp 98.0°F | Resp 16 | Ht 64.0 in | Wt 169.0 lb

## 2018-04-24 DIAGNOSIS — M25552 Pain in left hip: Secondary | ICD-10-CM | POA: Diagnosis not present

## 2018-04-24 DIAGNOSIS — M5481 Occipital neuralgia: Secondary | ICD-10-CM | POA: Insufficient documentation

## 2018-04-24 DIAGNOSIS — M19012 Primary osteoarthritis, left shoulder: Secondary | ICD-10-CM | POA: Diagnosis not present

## 2018-04-24 DIAGNOSIS — Z599 Problem related to housing and economic circumstances, unspecified: Secondary | ICD-10-CM

## 2018-04-24 DIAGNOSIS — M170 Bilateral primary osteoarthritis of knee: Secondary | ICD-10-CM | POA: Diagnosis not present

## 2018-04-24 DIAGNOSIS — M25561 Pain in right knee: Secondary | ICD-10-CM | POA: Insufficient documentation

## 2018-04-24 DIAGNOSIS — Z59819 Housing instability, housed unspecified: Secondary | ICD-10-CM

## 2018-04-24 DIAGNOSIS — G8929 Other chronic pain: Secondary | ICD-10-CM | POA: Diagnosis not present

## 2018-04-24 DIAGNOSIS — E7800 Pure hypercholesterolemia, unspecified: Secondary | ICD-10-CM | POA: Insufficient documentation

## 2018-04-24 DIAGNOSIS — F411 Generalized anxiety disorder: Secondary | ICD-10-CM | POA: Insufficient documentation

## 2018-04-24 DIAGNOSIS — Z79891 Long term (current) use of opiate analgesic: Secondary | ICD-10-CM | POA: Diagnosis not present

## 2018-04-24 DIAGNOSIS — M4802 Spinal stenosis, cervical region: Secondary | ICD-10-CM | POA: Insufficient documentation

## 2018-04-24 DIAGNOSIS — M533 Sacrococcygeal disorders, not elsewhere classified: Secondary | ICD-10-CM | POA: Diagnosis not present

## 2018-04-24 DIAGNOSIS — M47896 Other spondylosis, lumbar region: Secondary | ICD-10-CM | POA: Diagnosis not present

## 2018-04-24 DIAGNOSIS — G40909 Epilepsy, unspecified, not intractable, without status epilepticus: Secondary | ICD-10-CM | POA: Insufficient documentation

## 2018-04-24 DIAGNOSIS — G250 Essential tremor: Secondary | ICD-10-CM | POA: Insufficient documentation

## 2018-04-24 DIAGNOSIS — M7918 Myalgia, other site: Secondary | ICD-10-CM | POA: Diagnosis not present

## 2018-04-24 DIAGNOSIS — M5416 Radiculopathy, lumbar region: Secondary | ICD-10-CM | POA: Diagnosis not present

## 2018-04-24 DIAGNOSIS — Z598 Other problems related to housing and economic circumstances: Secondary | ICD-10-CM | POA: Insufficient documentation

## 2018-04-24 DIAGNOSIS — G894 Chronic pain syndrome: Secondary | ICD-10-CM | POA: Insufficient documentation

## 2018-04-24 DIAGNOSIS — I671 Cerebral aneurysm, nonruptured: Secondary | ICD-10-CM | POA: Insufficient documentation

## 2018-04-24 DIAGNOSIS — M5030 Other cervical disc degeneration, unspecified cervical region: Secondary | ICD-10-CM | POA: Diagnosis not present

## 2018-04-24 DIAGNOSIS — D869 Sarcoidosis, unspecified: Secondary | ICD-10-CM | POA: Insufficient documentation

## 2018-04-24 DIAGNOSIS — M79605 Pain in left leg: Secondary | ICD-10-CM | POA: Insufficient documentation

## 2018-04-24 DIAGNOSIS — M19011 Primary osteoarthritis, right shoulder: Secondary | ICD-10-CM | POA: Insufficient documentation

## 2018-04-24 DIAGNOSIS — Z5181 Encounter for therapeutic drug level monitoring: Secondary | ICD-10-CM | POA: Diagnosis not present

## 2018-04-24 DIAGNOSIS — G4733 Obstructive sleep apnea (adult) (pediatric): Secondary | ICD-10-CM | POA: Insufficient documentation

## 2018-04-24 DIAGNOSIS — M25551 Pain in right hip: Secondary | ICD-10-CM | POA: Insufficient documentation

## 2018-04-24 DIAGNOSIS — Z880 Allergy status to penicillin: Secondary | ICD-10-CM | POA: Insufficient documentation

## 2018-04-24 DIAGNOSIS — M25562 Pain in left knee: Secondary | ICD-10-CM | POA: Insufficient documentation

## 2018-04-24 DIAGNOSIS — M47893 Other spondylosis, cervicothoracic region: Secondary | ICD-10-CM | POA: Diagnosis not present

## 2018-04-24 DIAGNOSIS — M5441 Lumbago with sciatica, right side: Principal | ICD-10-CM

## 2018-04-24 DIAGNOSIS — M79604 Pain in right leg: Secondary | ICD-10-CM | POA: Insufficient documentation

## 2018-04-24 DIAGNOSIS — M5136 Other intervertebral disc degeneration, lumbar region: Secondary | ICD-10-CM | POA: Insufficient documentation

## 2018-04-24 DIAGNOSIS — Z9981 Dependence on supplemental oxygen: Secondary | ICD-10-CM | POA: Insufficient documentation

## 2018-04-24 DIAGNOSIS — J9611 Chronic respiratory failure with hypoxia: Secondary | ICD-10-CM | POA: Insufficient documentation

## 2018-04-24 DIAGNOSIS — I10 Essential (primary) hypertension: Secondary | ICD-10-CM | POA: Insufficient documentation

## 2018-04-24 DIAGNOSIS — K219 Gastro-esophageal reflux disease without esophagitis: Secondary | ICD-10-CM | POA: Insufficient documentation

## 2018-04-24 DIAGNOSIS — Z79899 Other long term (current) drug therapy: Secondary | ICD-10-CM | POA: Insufficient documentation

## 2018-04-24 DIAGNOSIS — Z888 Allergy status to other drugs, medicaments and biological substances status: Secondary | ICD-10-CM | POA: Insufficient documentation

## 2018-04-24 DIAGNOSIS — J449 Chronic obstructive pulmonary disease, unspecified: Secondary | ICD-10-CM | POA: Insufficient documentation

## 2018-04-24 DIAGNOSIS — G43909 Migraine, unspecified, not intractable, without status migrainosus: Secondary | ICD-10-CM | POA: Insufficient documentation

## 2018-04-24 DIAGNOSIS — F3110 Bipolar disorder, current episode manic without psychotic features, unspecified: Secondary | ICD-10-CM | POA: Insufficient documentation

## 2018-04-24 DIAGNOSIS — Z7982 Long term (current) use of aspirin: Secondary | ICD-10-CM | POA: Insufficient documentation

## 2018-04-24 MED ORDER — HYDROCODONE-ACETAMINOPHEN 10-325 MG PO TABS: 1.0000 | ORAL_TABLET | Freq: Three times a day (TID) | ORAL | 0 refills | Status: DC | PRN

## 2018-04-24 MED ORDER — TIZANIDINE HCL 4 MG PO TABS: 4.0000 mg | ORAL_TABLET | Freq: Three times a day (TID) | ORAL | 1 refills | Status: DC

## 2018-04-24 NOTE — Progress Notes (Signed)
Nursing Pain Medication Assessment:  Safety precautions to be maintained throughout the outpatient stay will include: orient to surroundings, keep bed in low position, maintain call bell within reach at all times, provide assistance with transfer out of bed and ambulation.  Medication Inspection Compliance: Pill count conducted under aseptic conditions, in front of the patient. Neither the pills nor the bottle was removed from the patient's sight at any time. Once count was completed pills were immediately returned to the patient in their original bottle.  Medication: Hydrocodone/APAP Pill/Patch Count: 6 of 90 pills remain Pill/Patch Appearance: Markings consistent with prescribed medication Bottle Appearance: Standard pharmacy container. Clearly labeled. Filled Date: 4 / 10 / 2019 Last Medication intake:  Today

## 2018-04-24 NOTE — Progress Notes (Addendum)
Patient's Name: Jean Gomez  MRN: [id number]  Referring Provider: Kirk Ruths, MD  DOB: Dec 02, 1956  PCP: Kirk Ruths, MD  DOS: 04/24/2018  Note by: Vevelyn Francois NP  Service setting: Ambulatory outpatient  Specialty: Interventional Pain Management  Location: [facility] (AMB) Pain Management Facility    Patient type: Established    Primary Reason(s) for Visit: Encounter for prescription drug management. (Level of risk: moderate)  CC: Migraine and Knee Pain (bilaterally)  HPI  Jean Gomez is a 62 y.o. year old, female patient, who comes today for a medication management evaluation. She has Chronic pain syndrome; Pharmacologic therapy; Disorder of skeletal system; Problems influencing health status; Long term current use of opiate analgesic; Chronic low back pain (Bilateral) with right-sided sciatica (Secondary Area of Pain) (R>L); Chronic lower extremity pain (Tertiary Area of Pain) (Bilateral) (R>L); Chronic knee pain (Fourth Area of Pain) (Bilateral) (R>L); Chronic shoulder pain (Fifth Area of Pain) (Bilateral) (R>L); Occipital neuralgia (Primary Area of Pain) (Bilateral) (R>L); Chronic neck pain (Bilateral) (R>L); Chronic sacroiliac joint pain (Right); Chronic migraine without aura; Chronic respiratory failure with hypoxia ([place]); Constipation; Epilepsy ([place]); HTN, goal below 140/90; Essential tremor; Generalized anxiety disorder; H/O aneurysm; GERD (gastroesophageal reflux disease); History of depression; History of seizure disorder; Hypercholesterolemia; Insomnia; Irritable bowel syndrome; Major depression in remission ([place]); Muscle spasm; Obstructive sleep apnea on CPAP; Osteoarthritis of knee (Bilateral) (R>L); COPD (chronic obstructive pulmonary disease) ([place]); Rotator cuff injury; Sarcoidosis; Spondylosis of lumbar region without myelopathy or radiculopathy; Vitamin D deficiency; Chronic shoulder pain (Right); Nonruptured cerebral aneurysm; Chronic headache disorder (Primary Area of  Pain); Pulmonary emphysema ([place]); Seizure disorder ([place]); Hiatal hernia; Occipital headache (Right); Frontal headache (Left); Chronic low back pain (Midline) (Secondary Area of Pain); Chronic lumbar radiculopathy (Right); Osteoarthritis involving multiple joints; Osteoarthritis of shoulder (Bilateral); Chronic musculoskeletal pain; Lumbar facet joint syndrome (Bilateral) (R>L); Chronic hip pain (Tertiary Area of Pain) (Bilateral) (R>L); DDD (degenerative disc disease), cervical; Cervical facet arthropathy (Bilateral); Cervical facet syndrome (Bilateral) (R>L); Cervical Grade 1 Anterolisthesis of C4 over C5 (Degenerative); Cervical Grade 1 Retrolisthesis of C5 over C6; Cervical foraminal stenosis (C3-4, C4-5, and C5-6) (Bilateral); DDD (degenerative disc disease), lumbar; Osteoarthritis of lumbar spine; Osteoarthritis of facet joint of lumbar spine; Spondylosis without myelopathy or radiculopathy, cervicothoracic region; and Bipolar affective disorder, current episode manic ([place]) on their problem list. Her primarily concern today is the Migraine and Knee Pain (bilaterally)  Pain Assessment: Location: (c/o/chronic migraines) Head Radiating: radiates anteriorly Onset: More than a month ago Duration: Chronic pain Quality: Throbbing, Constant Severity: 7 /10 (subjective, self-reported pain score)  Note: Reported level is compatible with observation. Clinically the patient looks like a 3/10 A 3/10 is viewed as "Moderate" and described as significantly interfering with activities of daily living (ADL). It becomes difficult to feed, bathe, get dressed, get on and off the toilet or to perform personal hygiene functions. Difficult to get in and out of bed or a chair without assistance. Very distracting. With effort, it can be ignored when deeply involved in activities. Information on the proper use of the pain scale provided to the patient today. When using our objective Pain Scale, levels between 6 and 10/10 are  said to belong in an emergency room, as it progressively worsens from a 6/10, described as severely limiting, requiring emergency care not usually available at an outpatient pain management facility. At a 6/10 level, communication becomes difficult and requires great effort. Assistance to reach the emergency department may be required. Facial flushing and  profuse sweating along with potentially dangerous increases in heart rate and blood pressure will be evident. Effect on ADL: keeps a dark environment at home - "I cannot stand the light". Timing: Constant Modifying factors: medications BP: (!) 109/99  HR: (!) 106  Jean Gomez was last scheduled for an appointment on 04/09/2018 for medication management. During today's appointment we reviewed Jean Gomez's chronic pain status, as well as her outpatient medication regimen. She admits that she continues to have headaches. This maybe related to her increased stress. She admits that she is no longer living with her duaghter. She is waiting to have her second Occipital nerve block.  She is SP Hylagan injections. She will continue the series.   The patient  reports that she does not use drugs. Her body mass index is 29.01 kg/m.  Further details on both, my assessment(s), as well as the proposed treatment plan, please see below.  Controlled Substance Pharmacotherapy Assessment REMS (Risk Evaluation and Mitigation Strategy)  Analgesic:Hydrocodone/acetaminophen 10/325 3 times daily (fill date 11/19/2017) hydrocodone 30 mg per day. MME/day:97m/day   WRise Patience RN  04/24/2018  8:43 AM  Sign at close encounter Nursing Pain Medication Assessment:  Safety precautions to be maintained throughout the outpatient stay will include: orient to surroundings, keep bed in low position, maintain call bell within reach at all times, provide assistance with transfer out of bed and ambulation.  Medication Inspection Compliance: Pill count conducted under aseptic  conditions, in front of the patient. Neither the pills nor the bottle was removed from the patient's sight at any time. Once count was completed pills were immediately returned to the patient in their original bottle.  Medication: Hydrocodone/APAP Pill/Patch Count: 6 of 90 pills remain Pill/Patch Appearance: Markings consistent with prescribed medication Bottle Appearance: Standard pharmacy container. Clearly labeled. Filled Date: 4 / 10 / 2019 Last Medication intake:  Today   Pharmacokinetics: Liberation and absorption (onset of action): WNL Distribution (time to peak effect): WNL Metabolism and excretion (duration of action): WNL         Pharmacodynamics: Desired effects: Analgesia: Ms. BDurhamreports >50% benefit. Functional ability: Patient reports that medication allows her to accomplish basic ADLs Clinically meaningful improvement in function (CMIF): Sustained CMIF goals met Perceived effectiveness: Described as relatively effective, allowing for increase in activities of daily living (ADL) Undesirable effects: Side-effects or Adverse reactions: None reported Monitoring: [place] PMP: Online review of the past 168-montheriod conducted. Compliant with practice rules and regulations Last UDS on record: Summary  Date Value Ref Range Status  03/27/2018 FINAL  Final    Comment:    ==================================================================== TOXASSURE SELECT 13 (MW) ==================================================================== Test                             Result       Flag       Units Drug Present and Declared for Prescription Verification   Hydrocodone                    646          EXPECTED   ng/mg creat   Hydromorphone                  367          EXPECTED   ng/mg creat   Dihydrocodeine                 317  EXPECTED   ng/mg creat   Norhydrocodone                 2063         EXPECTED   ng/mg creat    Sources of hydrocodone include scheduled  prescription    medications. Hydromorphone, dihydrocodeine and norhydrocodone are    expected metabolites of hydrocodone. Hydromorphone and    dihydrocodeine are also available as scheduled prescription    medications. ==================================================================== Test                      Result    Flag   Units      Ref Range   Creatinine              24               mg/dL      >=20 ==================================================================== Declared Medications:  The flagging and interpretation on this report are based on the  following declared medications.  Unexpected results may arise from  inaccuracies in the declared medications.  **Note: The testing scope of this panel includes these medications:  Hydrocodone (Hydrocodone-Acetaminophen)  **Note: The testing scope of this panel does not include following  reported medications:  Acetaminophen (Hydrocodone-Acetaminophen)  Albuterol  Amlodipine  Aspirin (Aspirin 81)  Atorvastatin  Budenoside (Symbicort)  Clotrimazole (Lotrisone)  Diclofenac  Escitalopram  Formoterol (Symbicort)  Hydrochlorothiazide (HCTZ)  Ipratropium  Ketoconazole  Lactulose  Levetiracetam  Losartan (Losartan Potassium)  Lubiprostone  Mirtazapine  Nitroglycerin  Ondansetron  Pantoprazole  Potassium  Sucralfate  Tizanidine  Topical  Torsemide  Venlafaxine  Vitamin D2 (Ergocalciferol) ==================================================================== For clinical consultation, please call [phone]. ====================================================================    UDS interpretation: Compliant          Medication Assessment Form: Reviewed. Patient indicates being compliant with therapy Treatment compliance: Compliant Risk Assessment Profile: Aberrant behavior: See prior evaluations. None observed or detected today Comorbid factors increasing risk of overdose: See prior notes. No additional risks  detected today Risk of substance use disorder (SUD): Low Opioid Risk Tool - 04/24/18 0833      Family History of Substance Abuse   Alcohol  Negative    Illegal Drugs  Negative    Rx Drugs  Negative      Personal History of Substance Abuse   Alcohol  Negative    Illegal Drugs  Negative    Rx Drugs  Negative      Age   Age between 24-45 years   No      Psychological Disease   Psychological Disease  Negative    Depression  Positive      Total Score   Opioid Risk Tool Scoring  1    Opioid Risk Interpretation  Low Risk      ORT Scoring interpretation table:  Score <3 = Low Risk for SUD  Score between 4-7 = Moderate Risk for SUD  Score >8 = High Risk for Opioid Abuse   Risk Mitigation Strategies:  Patient Counseling: Covered Patient-Prescriber Agreement (PPA): Present and active  Notification to other healthcare providers: Done  Pharmacologic Plan: No change in therapy, at this time.             Laboratory Chemistry  Inflammation Markers (CRP: Acute Phase) (ESR: Chronic Phase) Lab Results  Component Value Date   CRP <0.8 02/07/2018   ESRSEDRATE 53 (H) 02/07/2018  Rheumatology Markers No results found for: RF, ANA, Jean Gomez, [facility]                      Renal Function Markers Lab Results  Component Value Date   BUN 10 04/18/2018   CREATININE 0.60 04/18/2018   GFRAA >60 04/18/2018   GFRNONAA >60 04/18/2018                              Hepatic Function Markers Lab Results  Component Value Date   AST 31 04/17/2018   ALT 22 04/17/2018   ALBUMIN 4.9 04/17/2018   ALKPHOS 65 04/17/2018                        Electrolytes Lab Results  Component Value Date   NA 136 04/18/2018   K 3.7 04/18/2018   CL 98 (L) 04/18/2018   CALCIUM 9.2 04/18/2018   MG 1.8 04/18/2018                        Neuropathy Markers Lab Results  Component Value Date   VITAMINB12 268 02/07/2018   HGBA1C 5.9 (H) 04/18/2018                         Bone Pathology Markers Lab Results  Component Value Date   25OHVITD1 14 (L) 02/07/2018   25OHVITD2 <1.0 02/07/2018   25OHVITD3 14 02/07/2018                         Coagulation Parameters Lab Results  Component Value Date   PLT 423 04/17/2018                        Cardiovascular Markers Lab Results  Component Value Date   HGB 10.8 (L) 04/17/2018   HCT 33.1 (L) 04/17/2018                         CA Markers No results found for: CEA, CA125, LABCA2                      Note: Lab results reviewed.  Recent Diagnostic Imaging Results  DG C-Arm 1-60 Min-No Report Fluoroscopy was utilized by the requesting physician.  No radiographic  interpretation.   Complexity Note: Imaging results reviewed. Results shared with Ms. Owens Shark, using Layman's terms.                         Meds   Current Outpatient Medications:  .  Albuterol Sulfate 108 (90 Base) MCG/ACT AEPB, Inhale 2 puffs into the lungs every 6 (six) hours as needed., Disp: , Rfl:  .  amLODipine (NORVASC) 10 MG tablet, Take 1 tablet (10 mg total) by mouth daily., Disp: 30 tablet, Rfl: 0 .  aspirin EC 81 MG tablet, Take 1 tablet (81 mg total) by mouth daily., Disp: 30 tablet, Rfl: 0 .  atorvastatin (LIPITOR) 10 MG tablet, Take 1 tablet (10 mg total) by mouth daily., Disp: 30 tablet, Rfl: 0 .  betamethasone, augmented, (DIPROLENE) 0.05 % lotion, Apply topically 2 (two) times daily., Disp: , Rfl:  .  clotrimazole-betamethasone (LOTRISONE) cream, Apply 1 application topically 2 (two) times daily., Disp: 30 g, Rfl: 0 .  diclofenac sodium (VOLTAREN) 1 % GEL, Apply 2 g topically 4 (four) times daily., Disp: , Rfl:  .  escitalopram (LEXAPRO) 20 MG tablet, Take 1 tablet (20 mg total) by mouth daily., Disp: 30 tablet, Rfl: 0 .  hydrochlorothiazide (HYDRODIURIL) 25 MG tablet, Take 1 tablet (25 mg total) by mouth daily., Disp: 30 tablet, Rfl: 0 .  [START ON 05/26/2018] HYDROcodone-acetaminophen (NORCO) 10-325 MG tablet, Take 1  tablet by mouth every 8 (eight) hours as needed for severe pain., Disp: 90 tablet, Rfl: 0 .  ipratropium (ATROVENT) 0.02 % nebulizer solution, Take 0.5 mg by nebulization every 4 (four) hours as needed for wheezing or shortness of breath., Disp: , Rfl:  .  ketoconazole (NIZORAL) 2 % shampoo, Apply 1 application topically 2 (two) times a week., Disp: , Rfl:  .  levETIRAcetam (KEPPRA) 750 MG tablet, Take 1 tablet (750 mg total) by mouth daily., Disp: 30 tablet, Rfl: 0 .  losartan (COZAAR) 25 MG tablet, Take 1 tablet (25 mg total) by mouth daily., Disp: 30 tablet, Rfl: 0 .  nitroGLYCERIN (NITROSTAT) 0.4 MG SL tablet, Place 0.4 mg under the tongue every 5 (five) minutes as needed for chest pain., Disp: , Rfl:  .  pantoprazole (PROTONIX) 40 MG tablet, Take 1 tablet (40 mg total) by mouth daily., Disp: 30 tablet, Rfl: 0 .  potassium chloride SA (K-DUR,KLOR-CON) 20 MEQ tablet, Take 1 tablet (20 mEq total) by mouth 2 (two) times daily., Disp: 30 tablet, Rfl: 0 .  QUEtiapine (SEROQUEL) 100 MG tablet, Take 1 tablet (100 mg total) by mouth at bedtime., Disp: 30 tablet, Rfl: 0 .  sucralfate (CARAFATE) 1 g tablet, Take 1 tablet (1 g total) by mouth 4 (four) times daily., Disp: 120 tablet, Rfl: 0 .  tiZANidine (ZANAFLEX) 4 MG tablet, Take 1 tablet (4 mg total) by mouth 3 (three) times daily., Disp: 90 tablet, Rfl: 1 .  torsemide (DEMADEX) 5 MG tablet, , Disp: , Rfl: 11 .  venlafaxine (EFFEXOR) 75 MG tablet, Take 1 tablet (75 mg total) by mouth daily., Disp: 30 tablet, Rfl: 0 .  [START ON 04/26/2018] HYDROcodone-acetaminophen (NORCO) 10-325 MG tablet, Take 1 tablet by mouth every 8 (eight) hours as needed for severe pain., Disp: 90 tablet, Rfl: 0  ROS  Constitutional: Denies any fever or chills Gastrointestinal: No reported hemesis, hematochezia, vomiting, or acute GI distress Musculoskeletal: Denies any acute onset joint swelling, redness, loss of ROM, or weakness Neurological: No reported episodes of acute  onset apraxia, aphasia, dysarthria, agnosia, amnesia, paralysis, loss of coordination, or loss of consciousness  Allergies  Jean Gomez is allergic to gabapentin; labetalol; sulfabenzamide; fentanyl; and penicillins.  [place]  Drug: Jean Gomez  reports that she does not use drugs. Alcohol:  reports that she drinks alcohol. Tobacco:  reports that she has never smoked. She has never used smokeless tobacco. Medical:  has a past medical history of Asthma, Cerebral aneurysm, Chest pain (11/01/2016), Chronic migraine, Chronic respiratory failure ([place]), COPD (chronic obstructive pulmonary disease) ([place]), Hypertension, Osteoarthritis, Sarcoid, and Seizures ([place]). Surgical: Jean Gomez  has a past surgical history that includes Cholecystectomy; Abdominal hysterectomy; and Spinal fusion. Family: family history is not on file.  Constitutional Exam  General appearance: Well nourished, well developed, and well hydrated. In no apparent acute distress Vitals:   04/24/18 0826  BP: (!) 109/99  Pulse: (!) 106  Resp: 16  Temp: 98 F (36.7 C)  TempSrc: Oral  SpO2: 100%  Weight: 169 lb (76.7 kg)  Height: 5'  4" (1.626 m)  Psych/Mental status: Alert, oriented x 3 (person, place, & time)       Eyes: PERLA Respiratory: Oxygen-dependent COPD  Cervical Spine Area Exam  Skin & Axial Inspection: No masses, redness, edema, swelling, or associated skin lesions Alignment: Symmetrical Functional ROM: Unrestricted ROM      Stability: No instability detected Muscle Tone/Strength: Functionally intact. No obvious neuro-muscular anomalies detected. Sensory (Neurological): Unimpaired Palpation: Tender              Upper Extremity (UE) Exam    Side: Right upper extremity  Side: Left upper extremity  Skin & Extremity Inspection: Skin color, temperature, and hair growth are WNL. No peripheral edema or cyanosis. No masses, redness, swelling, asymmetry, or associated skin lesions. No contractures.  Skin & Extremity Inspection:  Skin color, temperature, and hair growth are WNL. No peripheral edema or cyanosis. No masses, redness, swelling, asymmetry, or associated skin lesions. No contractures.  Functional ROM: Unrestricted ROM          Functional ROM: Unrestricted ROM          Muscle Tone/Strength: Functionally intact. No obvious neuro-muscular anomalies detected.  Muscle Tone/Strength: Functionally intact. No obvious neuro-muscular anomalies detected.  Sensory (Neurological): Unimpaired          Sensory (Neurological): Unimpaired          Palpation: No palpable anomalies              Palpation: No palpable anomalies              Specialized Test(s): Deferred         Specialized Test(s): Deferred          Thoracic Spine Area Exam  Skin & Axial Inspection: No masses, redness, or swelling Alignment: Symmetrical Functional ROM: Unrestricted ROM Stability: No instability detected Muscle Tone/Strength: Functionally intact. No obvious neuro-muscular anomalies detected. Sensory (Neurological): Unimpaired Muscle strength & Tone: No palpable anomalies  Lumbar Spine Area Exam  Skin & Axial Inspection: No masses, redness, or swelling Alignment: Symmetrical Functional ROM: Unrestricted ROM       Stability: No instability detected Muscle Tone/Strength: Functionally intact. No obvious neuro-muscular anomalies detected. Sensory (Neurological): Unimpaired Palpation: No palpable anomalies       Provocative Tests: Lumbar Hyperextension and rotation test: evaluation deferred today       Lumbar Lateral bending test: evaluation deferred today       Patrick's Maneuver: evaluation deferred today                    Gait & Posture Assessment  Ambulation: Patient ambulates using a cane Gait: Relatively normal for age and body habitus Posture: WNL   Lower Extremity Exam    Side: Right lower extremity  Side: Left lower extremity  Stability: No instability observed          Stability: No instability observed          Skin &  Extremity Inspection: brace worn  Skin & Extremity Inspection: brace worn  Functional ROM: Decreased ROM                  Functional ROM: Decreased ROM                  Muscle Tone/Strength: Functionally intact. No obvious neuro-muscular anomalies detected.  Muscle Tone/Strength: Functionally intact. No obvious neuro-muscular anomalies detected.  Sensory (Neurological): Unimpaired  Sensory (Neurological): Unimpaired  Palpation: No palpable anomalies  Palpation: No palpable anomalies   Assessment  Primary Diagnosis & Pertinent Problem List: The primary encounter diagnosis was Occipital neuralgia (Primary Area of Pain) (Bilateral) (R>L). Diagnoses of Chronic knee pain (Fourth Area of Pain) (Bilateral) (R>L), Chronic low back pain (Midline) (Secondary Area of Pain), Chronic musculoskeletal pain, Chronic pain syndrome, and Housing situation unstable were also pertinent to this visit.  Status Diagnosis  Persistent Persistent Persistent 1. Occipital neuralgia (Primary Area of Pain) (Bilateral) (R>L)   2. Chronic knee pain (Fourth Area of Pain) (Bilateral) (R>L)   3. Chronic low back pain (Midline) (Secondary Area of Pain)   4. Chronic musculoskeletal pain   5. Chronic pain syndrome   6. Housing situation unstable     Problems updated and reviewed during this visit: No problems updated. Plan of Care  Pharmacotherapy (Medications Ordered): Meds ordered this encounter  Medications  . tiZANidine (ZANAFLEX) 4 MG tablet    Sig: Take 1 tablet (4 mg total) by mouth 3 (three) times daily.    Dispense:  90 tablet    Refill:  1    Do not place medication on "Automatic Refill". Fill one day early if pharmacy is closed on scheduled refill date.    Order Specific Question:   Supervising Provider    Answer:   Milinda Pointer [phone]  . HYDROcodone-acetaminophen (NORCO) 10-325 MG tablet    Sig: Take 1 tablet by mouth every 8 (eight) hours as needed for severe pain.    Dispense:  90 tablet     Refill:  0    Do not place this medication, or any other prescription from our practice, on "Automatic Refill"Fill one day early if pharmacy is closed on scheduled refill date. Do not fill until: 05/26/2018 To last until: 06/25/2018    Order Specific Question:   Supervising Provider    Answer:   Milinda Pointer [phone]  . HYDROcodone-acetaminophen (NORCO) 10-325 MG tablet    Sig: Take 1 tablet by mouth every 8 (eight) hours as needed for severe pain.    Dispense:  90 tablet    Refill:  0    Do not place this medication, or any other prescription from our practice, on "Automatic Refill". Patient may have prescription filled one day early if pharmacy is closed on scheduled refill date. Do not fill until: 04/26/2018 To last until:05/26/2018    Order Specific Question:   Supervising Provider    Answer:   Milinda Pointer [phone]   New Prescriptions   HYDROCODONE-ACETAMINOPHEN (NORCO) 10-325 MG TABLET    Take 1 tablet by mouth every 8 (eight) hours as needed for severe pain.   Medications administered today: Peri F. Fiscus had no medications administered during this visit. Lab-work, procedure(s), and/or referral(s): Orders Placed This Encounter  Procedures  . Ambulatory referral to Social Work   Imaging and/or referral(s): AMB REFERRAL TO SOCIAL WORK  Interventional management options: Planned, scheduled, and/or pending: bilateral intra-articular Hyalgan knee injections#2 Diagnostic bilateral greater occipital nerve blockpending   Considering: Diagnostic bilateral greater occipital nerve block Possible bilateral occipital nerve RFA Possible bilateral occipital nerve peripheral nerves stimulator trial Diagnostic bilateral cervical facet block Possible bilateral cervical facet RFA Diagnostic right-sided cervical epidural steroid injection Diagnostic bilateral intra-articular shoulder joint injection Diagnostic bilateral suprascapular nerve block Possible  bilateral suprascapular nerve RFA Diagnostic bilateral lumbar facet block Possible bilateral lumbar facet RFA Diagnostic right-sided lumbar epidural steroid injection Diagnostic bilateral transforaminal epidural steroid injection Diagnostic bilateral intra-articular hip joint injection Diagnostic bilateral femoral nerve + obturator nerve block Possible bilateral femoral nerve + obturator nerve RFA  Diagnostic bilateral intra-articular knee joint injection with local anesthetic and steroid Possible series of 5 bilateral intra-articular Hyalgan knee injections Diagnostic bilateral genicular nerve block Possible bilateral genicular nerve RFA   PRN Procedures: None at this time   Provider-requested follow-up: Return in about 2 months (around 06/24/2018) for MedMgmt with Me Dionisio David), Appointment As Scheduled, in addition, Procedure(w/Sedation).  Future Appointments  Date Time Provider [place]  04/30/2018  9:00 AM Milinda Pointer, MD [facility] None  05/07/2018  9:15 AM Milinda Pointer, MD [facility] None  06/18/2018  8:45 AM Vevelyn Francois, NP [facility] None   Primary Care Physician: Kirk Ruths, MD Location: [facility] Note by: Vevelyn Francois NP Date: 04/24/2018; Time: 10:07 AM  Pain Score Disclaimer: We use the NRS-11 scale. This is a self-reported, subjective measurement of pain severity with only modest accuracy. It is used primarily to identify changes within a particular patient. It must be understood that outpatient pain scales are significantly less accurate that those used for research, where they can be applied under ideal controlled circumstances with minimal exposure to variables. In reality, the score is likely to be a combination of pain intensity and pain affect, where pain affect describes the degree of emotional arousal or changes in action readiness caused by the sensory experience of pain. Factors such as  social and work situation, setting, emotional state, anxiety levels, expectation, and prior pain experience may influence pain perception and show large inter-individual differences that may also be affected by time variables.  Patient instructions provided during this appointment: Patient Instructions   Tizanidine has been escribed to your pharmacy.  You have been given 2 Rx for Hydrocodone to last until 06/25/2018.  Occipital Nerve Block Patient Information  Description: The occipital nerves originate in the cervical (neck) spinal cord and travel upward through muscle and tissue to supply sensation to the back of the head and top of the scalp.  In addition, the nerves control some of the muscles of the scalp.  Occipital neuralgia is an irritation of these nerves which can cause headaches, numbness of the scalp, and neck discomfort.     The occipital nerve block will interrupt nerve transmission through these nerves and can relieve pain and spasm.  The block consists of insertion of a small needle under the skin in the back of the head to deposit local anesthetic (numbing medicine) and/or steroids around the nerve.  The entire block usually lasts less than 5 minutes.  Conditions which may be treated by occipital blocks:   Muscular pain and spasm of the scalp  Nerve irritation, back of the head  Headaches  Upper neck pain  Preparation for the injection:  1. Do not eat any solid food or dairy products within 8 hours of your appointment. 2. You may drink clear liquids up to 3 hours before appointment.  Clear liquids include water, black coffee, juice or soda.  No milk or cream please. 3. You may take your regular medication, including pain medications, with a sip of water before you appointment.  Diabetics should hold regular insulin (if taken separately) and take 1/2 normal NPH dose the morning of the procedure.  Carry some sugar containing items with you to your appointment. 4. A  driver must accompany you and be prepared to drive you home after your procedure. 5. Bring all your current medications with you. 6. An IV may be inserted and sedation may be given at the discretion of the physician. 7.  A blood pressure cuff, EKG, and other monitors will often be applied during the procedure.  Some patients may need to have extra oxygen administered for a short period. 8. You will be asked to provide medical information, including your allergies and medications, prior to the procedure.  We must know immediately if you are taking blood thinners (like Coumadin/Warfarin) or if you are allergic to IV iodine contrast (dye).  We must know if you could possible be pregnant.  9. Do not wear a high collared shirt or turtleneck.  Tie long hair up in the back if possible.  Possible side-effects:   Bleeding from needle site  Infection (rare, may require surgery)  Nerve injury (rare)  Hair on back of neck can be tinged with iodine scrub (this will wash out)  Light-headedness (temporary)  Pain at injection site (several days)  Decreased blood pressure (rare, temporary)  Seizure (very rare)  Call if you experience:   Hives or difficulty breathing ( go to the emergency room)  Inflammation or drainage at the injection site(s)  Please note:  Although the local anesthetic injected can often make your painful muscles or headache feel good for several hours after the injection, the pain may return.  It takes 3-7 days for steroids to work.  You may not notice any pain relief for at least one week.  If effective, we will often do a series of injections spaced 3-6 weeks apart to maximally decrease your pain.  GENERAL RISKS AND COMPLICATIONS  What are the risk, side effects and possible complications? Generally speaking, most procedures are safe.  However, with any procedure there are risks, side effects, and the possibility of complications.  The risks and complications are  dependent upon the sites that are lesioned, or the type of nerve block to be performed.  The closer the procedure is to the spine, the more serious the risks are.  Great care is taken when placing the radio frequency needles, block needles or lesioning probes, but sometimes complications can occur. 1. Infection: Any time there is an injection through the skin, there is a risk of infection.  This is why sterile conditions are used for these blocks.  There are four possible types of infection. 1. Localized skin infection. 2. Central Nervous System Infection-This can be in the form of Meningitis, which can be deadly. 3. Epidural Infections-This can be in the form of an epidural abscess, which can cause pressure inside of the spine, causing compression of the spinal cord with subsequent paralysis. This would require an emergency surgery to decompress, and there are no guarantees that the patient would recover from the paralysis. 4. Discitis-This is an infection of the intervertebral discs.  It occurs in about 1% of discography procedures.  It is difficult to treat and it may lead to surgery.        2. Pain: the needles have to go through skin and soft tissues, will cause soreness.       3. Damage to internal structures:  The nerves to be lesioned may be near blood vessels or    other nerves which can be potentially damaged.       4. Bleeding: Bleeding is more common if the patient is taking blood thinners such as  aspirin, Coumadin, Ticiid, Plavix, etc., or if he/she have some genetic predisposition  such as hemophilia. Bleeding into the spinal canal can cause compression of the spinal  cord with subsequent paralysis.  This would require an emergency surgery to  decompress and there are no guarantees that the patient would recover from the  paralysis.       5. Pneumothorax:  Puncturing of a lung is a possibility, every time a needle is introduced in  the area of the chest or upper back.  Pneumothorax refers  to free air around the  collapsed lung(s), inside of the thoracic cavity (chest cavity).  Another two possible  complications related to a similar event would include: Hemothorax and Chylothorax.   These are variations of the Pneumothorax, where instead of air around the collapsed  lung(s), you may have blood or chyle, respectively.       6. Spinal headaches: They may occur with any procedures in the area of the spine.       7. Persistent CSF (Cerebro-Spinal Fluid) leakage: This is a rare problem, but may occur  with prolonged intrathecal or epidural catheters either due to the formation of a fistulous  track or a dural tear.       8. Nerve damage: By working so close to the spinal cord, there is always a possibility of  nerve damage, which could be as serious as a permanent spinal cord injury with  paralysis.       9. Death:  Although rare, severe deadly allergic reactions known as "Anaphylactic  reaction" can occur to any of the medications used.      10. Worsening of the symptoms:  We can always make thing worse.  What are the chances of something like this happening? Chances of any of this occuring are extremely low.  By statistics, you have more of a chance of getting killed in a motor vehicle accident: while driving to the hospital than any of the above occurring .  Nevertheless, you should be aware that they are possibilities.  In general, it is similar to taking a shower.  Everybody knows that you can slip, hit your head and get killed.  Does that mean that you should not shower again?  Nevertheless always keep in mind that statistics do not mean anything if you happen to be on the wrong side of them.  Even if a procedure has a 1 (one) in a 1,000,000 (million) chance of going wrong, it you happen to be that one..Also, keep in mind that by statistics, you have more of a chance of having something go wrong when taking medications.  Who should not have this procedure? If you are on a blood thinning  medication (e.g. Coumadin, Plavix, see list of "Blood Thinners"), or if you have an active infection going on, you should not have the procedure.  If you are taking any blood thinners, please inform your physician.  How should I prepare for this procedure?  Do not eat or drink anything at least six hours prior to the procedure.  Bring a driver with you .  It cannot be a taxi.  Come accompanied by an adult that can drive you back, and that is strong enough to help you if your legs get weak or numb from the local anesthetic.  Take all of your medicines the morning of the procedure with just enough water to swallow them.  If you have diabetes, make sure that you are scheduled to have your procedure done first thing in the morning, whenever possible.  If you have diabetes, take only half of your insulin dose and notify our nurse that you have done so as soon as you arrive at the clinic.  If  you are diabetic, but only take blood sugar pills (oral hypoglycemic), then do not take them on the morning of your procedure.  You may take them after you have had the procedure.  Do not take aspirin or any aspirin-containing medications, at least eleven (11) days prior to the procedure.  They may prolong bleeding.  Wear loose fitting clothing that may be easy to take off and that you would not mind if it got stained with Betadine or blood.  Do not wear any jewelry or perfume  Remove any nail coloring.  It will interfere with some of our monitoring equipment.  NOTE: Remember that this is not meant to be interpreted as a complete list of all possible complications.  Unforeseen problems may occur.  BLOOD THINNERS The following drugs contain aspirin or other products, which can cause increased bleeding during surgery and should not be taken for 2 weeks prior to and 1 week after surgery.  If you should need take something for relief of minor pain, you may take acetaminophen which is found in Tylenol,m  Datril, Anacin-3 and Panadol. It is not blood thinner. The products listed below are.  Do not take any of the products listed below in addition to any listed on your instruction sheet.  A.P.C or A.P.C with Codeine Codeine Phosphate Capsules #3 Ibuprofen Ridaura  ABC compound Congesprin Imuran rimadil  Advil Cope Indocin Robaxisal  Alka-Seltzer Effervescent Pain Reliever and Antacid Coricidin or Coricidin-D  Indomethacin Rufen  Alka-Seltzer plus Cold Medicine Cosprin Ketoprofen S-A-C Tablets  Anacin Analgesic Tablets or Capsules Coumadin Korlgesic Salflex  Anacin Extra Strength Analgesic tablets or capsules CP-2 Tablets Lanoril Salicylate  Anaprox Cuprimine Capsules Levenox Salocol  Anexsia-D Dalteparin Magan Salsalate  Anodynos Darvon compound Magnesium Salicylate Sine-off  Ansaid Dasin Capsules Magsal Sodium Salicylate  Anturane Depen Capsules Marnal Soma  APF Arthritis pain formula Dewitt's Pills Measurin Stanback  Argesic Dia-Gesic Meclofenamic Sulfinpyrazone  Arthritis Bayer Timed Release Aspirin Diclofenac Meclomen Sulindac  Arthritis pain formula Anacin Dicumarol Medipren Supac  Analgesic (Safety coated) Arthralgen Diffunasal Mefanamic Suprofen  Arthritis Strength Bufferin Dihydrocodeine Mepro Compound Suprol  Arthropan liquid Dopirydamole Methcarbomol with Aspirin Synalgos  ASA tablets/Enseals Disalcid Micrainin Tagament  Ascriptin Doan's Midol Talwin  Ascriptin A/D Dolene Mobidin Tanderil  Ascriptin Extra Strength Dolobid Moblgesic Ticlid  Ascriptin with Codeine Doloprin or Doloprin with Codeine Momentum Tolectin  Asperbuf Duoprin Mono-gesic Trendar  Aspergum Duradyne Motrin or Motrin IB Triminicin  Aspirin plain, buffered or enteric coated Durasal Myochrisine Trigesic  Aspirin Suppositories Easprin Nalfon Trillsate  Aspirin with Codeine Ecotrin Regular or Extra Strength Naprosyn Uracel  Atromid-S Efficin Naproxen Ursinus  Auranofin Capsules Elmiron Neocylate Vanquish   Axotal Emagrin Norgesic Verin  Azathioprine Empirin or Empirin with Codeine Normiflo Vitamin E  Azolid Emprazil Nuprin Voltaren  Bayer Aspirin plain, buffered or children's or timed BC Tablets or powders Encaprin Orgaran Warfarin Sodium  Buff-a-Comp Enoxaparin Orudis Zorpin  Buff-a-Comp with Codeine Equegesic Os-Cal-Gesic   Buffaprin Excedrin plain, buffered or Extra Strength Oxalid   Bufferin Arthritis Strength Feldene Oxphenbutazone   Bufferin plain or Extra Strength Feldene Capsules Oxycodone with Aspirin   Bufferin with Codeine Fenoprofen Fenoprofen Pabalate or Pabalate-SF   Buffets II Flogesic Panagesic   Buffinol plain or Extra Strength Florinal or Florinal with Codeine Panwarfarin   Buf-Tabs Flurbiprofen Penicillamine   Butalbital Compound Four-way cold tablets Penicillin   Butazolidin Fragmin Pepto-Bismol   Carbenicillin Geminisyn Percodan   Carna Arthritis Reliever Geopen Persantine   Carprofen Gold's salt Persistin  Chloramphenicol Goody's Phenylbutazone   Chloromycetin Haltrain Piroxlcam   Clmetidine heparin Plaquenil   Cllnoril Hyco-pap Ponstel   Clofibrate Hydroxy chloroquine Propoxyphen         Before stopping any of these medications, be sure to consult the physician who ordered them.  Some, such as Coumadin (Warfarin) are ordered to prevent or treat serious conditions such as "deep thrombosis", "pumonary embolisms", and other heart problems.  The amount of time that you may need off of the medication may also vary with the medication and the reason for which you were taking it.  If you are taking any of these medications, please make sure you notify your pain physician before you undergo any procedures.         Sodium Hyaluronate intra-articular injection What is this medicine? SODIUM HYALURONATE (SOE dee um hye al yoor ON ate) is used to treat pain in the knee due to osteoarthritis. This medicine may be used for other purposes; ask your health care provider or  pharmacist if you have questions. COMMON BRAND NAME(S): Amvisc, DUROLANE, Euflexxa, GELSYN-3, Hyalgan, Monovisc, Orthovisc, Supartz, Supartz FX What should I tell my health care provider before I take this medicine? They need to know if you have any of these conditions: -bleeding disorders -glaucoma -infection in the knee joint -skin conditions or sensitivity -skin infection -an unusual allergic reaction to sodium hyaluronate, other medicines, foods, dyes, or preservatives. Different brands of sodium hyaluronate contain different allergens. Some may contain egg. Talk to your doctor about your allergies to make sure that you get the right product. -pregnant or trying to get pregnant -breast-feeding How should I use this medicine? This medicine is for injection into the knee joint. It is given by a health care professional in a hospital or clinic setting. Talk to your pediatrician regarding the use of this medicine in children. Special care may be needed. Overdosage: If you think you have taken too much of this medicine contact a poison control center or emergency room at once. NOTE: This medicine is only for you. Do not share this medicine with others. What if I miss a dose? This does not apply. What may interact with this medicine? Interactions are not expected. This list may not describe all possible interactions. Give your health care provider a list of all the medicines, herbs, non-prescription drugs, or dietary supplements you use. Also tell them if you smoke, drink alcohol, or use illegal drugs. Some items may interact with your medicine. What should I watch for while using this medicine? Tell your doctor or healthcare professional if your symptoms do not start to get better or if they get worse. If receiving this medicine for osteoarthritis, limit your activity after you receive your injection. Avoid physical activity for 48 hours following your injection to keep your knee from  swelling. Do not stand on your feet for more than 1 hour at a time during the first 48 hours following your injection. Ask your doctor or healthcare professional about when you can begin major physical activity again. What side effects may I notice from receiving this medicine? Side effects that you should report to your doctor or health care professional as soon as possible: -allergic reactions like skin rash, itching or hives, swelling of the face, lips, or tongue -dizziness -facial flushing -pain, tingling, numbness in the hands or feet -vision changes if received this medicine during eye surgery Side effects that usually do not require medical attention (report to your doctor or health care professional  if they continue or are bothersome): -back pain -bruising at site where injected -chills -diarrhea -fever -headache -joint pain -joint stiffness -joint swelling -muscle cramps -muscle pain -nausea, vomiting -pain, redness, or irritation at site where injected -weak or tired This list may not describe all possible side effects. Call your doctor for medical advice about side effects. You may report side effects to FDA at 1-800-FDA-1088. Where should I keep my medicine? This drug is given in a hospital or clinic and will not be stored at home. NOTE: This sheet is a summary. It may not cover all possible information. If you have questions about this medicine, talk to your doctor, pharmacist, or health care provider.  2018 Elsevier/Gold Standard (2016-01-06 08:34:51)  If you have any questions, please call [phone] [facility]   ____________________________________________________________________________________________  Medication Rules  Applies to: All patients receiving prescriptions (written or electronic).  Pharmacy of record: Pharmacy where electronic prescriptions will be sent. If written prescriptions are taken to a different  pharmacy, please inform the nursing staff. The pharmacy listed in the electronic medical record should be the one where you would like electronic prescriptions to be sent.  Prescription refills: Only during scheduled appointments. Applies to both, written and electronic prescriptions.  NOTE: The following applies primarily to controlled substances (Opioid* Pain Medications).   Patient's responsibilities: 12. Pain Pills: Bring all pain pills to every appointment (except for procedure appointments). 13. Pill Bottles: Bring pills in original pharmacy bottle. Always bring newest bottle. Bring bottle, even if empty. 14. Medication refills: You are responsible for knowing and keeping track of what medications you need refilled. The day before your appointment, write a list of all prescriptions that need to be refilled. Bring that list to your appointment and give it to the admitting nurse. Prescriptions will be written only during appointments. If you forget a medication, it will not be "Called in", "Faxed", or "electronically sent". You will need to get another appointment to get these prescribed. 15. Prescription Accuracy: You are responsible for carefully inspecting your prescriptions before leaving our office. Have the discharge nurse carefully go over each prescription with you, before taking them home. Make sure that your name is accurately spelled, that your address is correct. Check the name and dose of your medication to make sure it is accurate. Check the number of pills, and the written instructions to make sure they are clear and accurate. Make sure that you are given enough medication to last until your next medication refill appointment. 16. Taking Medication: Take medication as prescribed. Never take more pills than instructed. Never take medication more frequently than prescribed. Taking less pills or less frequently is permitted and encouraged, when it comes to controlled substances (written  prescriptions).  17. Inform other Doctors: Always inform, all of your healthcare providers, of all the medications you take. 18. Pain Medication from other Providers: You are not allowed to accept any additional pain medication from any other Doctor or Healthcare provider. There are two exceptions to this rule. (see below) In the event that you require additional pain medication, you are responsible for notifying us, as stated below. 19. Medication Agreement: You are responsible for carefully reading and following our Medication Agreement. This must be signed before receiving any prescriptions from our practice. Safely store a copy of your signed Agreement. Violations to the Agreement will result in no further prescriptions. (Additional copies of our Medication Agreement are available upon request.) 20. Laws, Rules, & Regulations: All  patients are expected to follow all Federal and [employer], [employer], Rules, [employer]. Ignorance of the Laws does not constitute a valid excuse. The use of any illegal substances is prohibited. 21. Adopted CDC guidelines & recommendations: Target dosing levels will be at or below 60 MME/day. Use of benzodiazepines** is not recommended.  Exceptions: There are only two exceptions to the rule of not receiving pain medications from other Healthcare Providers. 2. Exception #1 (Emergencies): In the event of an emergency (i.e.: accident requiring emergency care), you are allowed to receive additional pain medication. However, you are responsible for: As soon as you are able, call our office [phone], at any time of the day or night, and leave a message stating your name, the date and nature of the emergency, and the name and dose of the medication prescribed. In the event that your call is answered by a member of our staff, make sure to document and save the date, time, and the name of the person that took your information.  3. Exception #2 (Planned Surgery): In the event  that you are scheduled by another doctor or dentist to have any type of surgery or procedure, you are allowed (for a period no longer than 30 days), to receive additional pain medication, for the acute post-op pain. However, in this case, you are responsible for picking up a copy of our "Post-op Pain Management for Surgeons" handout, and giving it to your surgeon or dentist. This document is available at our office, and does not require an appointment to obtain it. Simply go to our office during business hours (Monday-Thursday from 8:00 AM to 4:00 PM) (Friday 8:00 AM to 12:00 Noon) or if you have a scheduled appointment with [place], prior to your surgery, and ask for it by name. In addition, you will need to provide [place] with your name, name of your surgeon, type of surgery, and date of procedure or surgery.  *Opioid medications include: morphine, codeine, oxycodone, oxymorphone, hydrocodone, hydromorphone, meperidine, tramadol, tapentadol, buprenorphine, fentanyl, methadone. **Benzodiazepine medications include: diazepam (Valium), alprazolam (Xanax), clonazepam (Klonopine), lorazepam (Ativan), clorazepate (Tranxene), chlordiazepoxide (Librium), estazolam (Prosom), oxazepam (Serax), temazepam (Restoril), triazolam (Halcion) (Last updated: 02/14/2018) ____________________________________________________________________________________________   ____________________________________________________________________________________________  Pain Scale  Introduction: The pain score used by this practice is the Verbal Numerical Rating Scale (VNRS-11). This is an 11-point scale. It is for adults and children 10 years or older. There are significant differences in how the pain score is reported, used, and applied. Forget everything you learned in the past and learn this scoring system.  General Information: The scale should reflect your current level of pain. Unless you are specifically asked for the level of your  worst pain, or your average pain. If you are asked for one of these two, then it should be understood that it is over the past 24 hours.  Basic Activities of Daily Living (ADL): Personal hygiene, dressing, eating, transferring, and using restroom.  Instructions: Most patients tend to report their level of pain as a combination of two factors, their physical pain and their psychosocial pain. This last one is also known as "suffering" and it is reflection of how physical pain affects you socially and psychologically. From now on, report them separately. From this point on, when asked to report your pain level, report only your physical pain. Use the following table for reference.  Pain Clinic Pain Levels (0-5/10)  Pain Level Score  Description  No Pain 0   Mild pain 1 Nagging, annoying,  but does not interfere with basic activities of daily living (ADL). Patients are able to eat, bathe, get dressed, toileting (being able to get on and off the toilet and perform personal hygiene functions), transfer (move in and out of bed or a chair without assistance), and maintain continence (able to control bladder and bowel functions). Blood pressure and heart rate are unaffected. A normal heart rate for a healthy adult ranges from 60 to 100 bpm (beats per minute).   Mild to moderate pain 2 Noticeable and distracting. Impossible to hide from other people. More frequent flare-ups. Still possible to adapt and function close to normal. It can be very annoying and may have occasional stronger flare-ups. With discipline, patients may get used to it and adapt.   Moderate pain 3 Interferes significantly with activities of daily living (ADL). It becomes difficult to feed, bathe, get dressed, get on and off the toilet or to perform personal hygiene functions. Difficult to get in and out of bed or a chair without assistance. Very distracting. With effort, it can be ignored when deeply involved in activities.   Moderately  severe pain 4 Impossible to ignore for more than a few minutes. With effort, patients may still be able to manage work or participate in some social activities. Very difficult to concentrate. Signs of autonomic nervous system discharge are evident: dilated pupils (mydriasis); mild sweating (diaphoresis); sleep interference. Heart rate becomes elevated (>115 bpm). Diastolic blood pressure (lower number) rises above 100 mmHg. Patients find relief in laying down and not moving.   Severe pain 5 Intense and extremely unpleasant. Associated with frowning face and frequent crying. Pain overwhelms the senses.  Ability to do any activity or maintain social relationships becomes significantly limited. Conversation becomes difficult. Pacing back and forth is common, as getting into a comfortable position is nearly impossible. Pain wakes you up from deep sleep. Physical signs will be obvious: pupillary dilation; increased sweating; goosebumps; brisk reflexes; cold, clammy hands and feet; nausea, vomiting or dry heaves; loss of appetite; significant sleep disturbance with inability to fall asleep or to remain asleep. When persistent, significant weight loss is observed due to the complete loss of appetite and sleep deprivation.  Blood pressure and heart rate becomes significantly elevated. Caution: If elevated blood pressure triggers a pounding headache associated with blurred vision, then the patient should immediately seek attention at an urgent or emergency care unit, as these may be signs of an impending stroke.    Emergency Department Pain Levels (6-10/10)  Emergency Room Pain 6 Severely limiting. Requires emergency care and should not be seen or managed at an outpatient pain management facility. Communication becomes difficult and requires great effort. Assistance to reach the emergency department may be required. Facial flushing and profuse sweating along with potentially dangerous increases in heart rate and  blood pressure will be evident.   Distressing pain 7 Self-care is very difficult. Assistance is required to transport, or use restroom. Assistance to reach the emergency department will be required. Tasks requiring coordination, such as bathing and getting dressed become very difficult.   Disabling pain 8 Self-care is no longer possible. At this level, pain is disabling. The individual is unable to do even the most "basic" activities such as walking, eating, bathing, dressing, transferring to a bed, or toileting. Fine motor skills are lost. It is difficult to think clearly.   Incapacitating pain 9 Pain becomes incapacitating. Thought processing is no longer possible. Difficult to remember your own name. Control of movement and  coordination are lost.   The worst pain imaginable 10 At this level, most patients pass out from pain. When this level is reached, collapse of the autonomic nervous system occurs, leading to a sudden drop in blood pressure and heart rate. This in turn results in a temporary and dramatic drop in blood flow to the brain, leading to a loss of consciousness. Fainting is one of the body's self defense mechanisms. Passing out puts the brain in a calmed state and causes it to shut down for a while, in order to begin the healing process.    Summary: 1. Refer to this scale when providing [place] with your pain level. 2. Be accurate and careful when reporting your pain level. This will help with your care. 3. Over-reporting your pain level will lead to loss of credibility. 4. Even a level of 1/10 means that there is pain and will be treated at our facility. 5. High, inaccurate reporting will be documented as "Symptom Exaggeration", leading to loss of credibility and suspicions of possible secondary gains such as obtaining more narcotics, or wanting to appear disabled, for fraudulent reasons. 6. Only pain levels of 5 or below will be seen at our facility. 7. Pain levels of 6 and above will  be sent to the Emergency Department and the appointment cancelled. ____________________________________________________________________________________________   BMI Assessment: Estimated body mass index is 29.01 kg/m as calculated from the following:   Height as of this encounter: _0  (1.626 m).   Weight as of this encounter: 169 lb (76.7 kg).  BMI interpretation table: BMI level Category Range association with higher incidence of chronic pain  <18 kg/m2 Underweight   18.5-24.9 kg/m2 Ideal body weight   25-29.9 kg/m2 Overweight Increased incidence by 20%  30-34.9 kg/m2 Obese (Class I) Increased incidence by 68%  35-39.9 kg/m2 Severe obesity (Class II) Increased incidence by 136%  >40 kg/m2 Extreme obesity (Class III) Increased incidence by 254%   Patient's current BMI Ideal Body weight  Body mass index is 29.01 kg/m. Ideal body weight: 54.7 kg (120 lb 9.5 oz) Adjusted ideal body weight: 63.5 kg (139 lb 15.3 oz)   BMI Readings from Last 4 Encounters:  04/24/18 29.01 kg/m  04/04/18 29.12 kg/m  03/27/18 29.01 kg/m  03/05/18 29.18 kg/m   Wt Readings from Last 4 Encounters:  04/24/18 169 lb (76.7 kg)  04/04/18 175 lb (79.4 kg)  03/27/18 169 lb (76.7 kg)  03/05/18 170 lb (77.1 kg)

## 2018-04-24 NOTE — Patient Instructions (Addendum)
Tizanidine has been escribed to your pharmacy.  You have been given 2 Rx for Hydrocodone to last until 06/25/2018.  Occipital Nerve Block Patient Information  Description: The occipital nerves originate in the cervical (neck) spinal cord and travel upward through muscle and tissue to supply sensation to the back of the head and top of the scalp.  In addition, the nerves control some of the muscles of the scalp.  Occipital neuralgia is an irritation of these nerves which can cause headaches, numbness of the scalp, and neck discomfort.     The occipital nerve block will interrupt nerve transmission through these nerves and can relieve pain and spasm.  The block consists of insertion of a small needle under the skin in the back of the head to deposit local anesthetic (numbing medicine) and/or steroids around the nerve.  The entire block usually lasts less than 5 minutes.  Conditions which may be treated by occipital blocks:   Muscular pain and spasm of the scalp  Nerve irritation, back of the head  Headaches  Upper neck pain  Preparation for the injection:  1. Do not eat any solid food or dairy products within 8 hours of your appointment. 2. You may drink clear liquids up to 3 hours before appointment.  Clear liquids include water, black coffee, juice or soda.  No milk or cream please. 3. You may take your regular medication, including pain medications, with a sip of water before you appointment.  Diabetics should hold regular insulin (if taken separately) and take 1/2 normal NPH dose the morning of the procedure.  Carry some sugar containing items with you to your appointment. 4. A driver must accompany you and be prepared to drive you home after your procedure. 5. Bring all your current medications with you. 6. An IV may be inserted and sedation may be given at the discretion of the physician. 7. A blood pressure cuff, EKG, and other monitors will often be applied during the procedure.   Some patients may need to have extra oxygen administered for a short period. 8. You will be asked to provide medical information, including your allergies and medications, prior to the procedure.  We must know immediately if you are taking blood thinners (like Coumadin/Warfarin) or if you are allergic to IV iodine contrast (dye).  We must know if you could possible be pregnant.  9. Do not wear a high collared shirt or turtleneck.  Tie long hair up in the back if possible.  Possible side-effects:   Bleeding from needle site  Infection (rare, may require surgery)  Nerve injury (rare)  Hair on back of neck can be tinged with iodine scrub (this will wash out)  Light-headedness (temporary)  Pain at injection site (several days)  Decreased blood pressure (rare, temporary)  Seizure (very rare)  Call if you experience:   Hives or difficulty breathing ( go to the emergency room)  Inflammation or drainage at the injection site(s)  Please note:  Although the local anesthetic injected can often make your painful muscles or headache feel good for several hours after the injection, the pain may return.  It takes 3-7 days for steroids to work.  You may not notice any pain relief for at least one week.  If effective, we will often do a series of injections spaced 3-6 weeks apart to maximally decrease your pain.  GENERAL RISKS AND COMPLICATIONS  What are the risk, side effects and possible complications? Generally speaking, most procedures are safe.  However, with any procedure there are risks, side effects, and the possibility of complications.  The risks and complications are dependent upon the sites that are lesioned, or the type of nerve block to be performed.  The closer the procedure is to the spine, the more serious the risks are.  Great care is taken when placing the radio frequency needles, block needles or lesioning probes, but sometimes complications can occur. 1. Infection: Any  time there is an injection through the skin, there is a risk of infection.  This is why sterile conditions are used for these blocks.  There are four possible types of infection. 1. Localized skin infection. 2. Central Nervous System Infection-This can be in the form of Meningitis, which can be deadly. 3. Epidural Infections-This can be in the form of an epidural abscess, which can cause pressure inside of the spine, causing compression of the spinal cord with subsequent paralysis. This would require an emergency surgery to decompress, and there are no guarantees that the patient would recover from the paralysis. 4. Discitis-This is an infection of the intervertebral discs.  It occurs in about 1% of discography procedures.  It is difficult to treat and it may lead to surgery.        2. Pain: the needles have to go through skin and soft tissues, will cause soreness.       3. Damage to internal structures:  The nerves to be lesioned may be near blood vessels or    other nerves which can be potentially damaged.       4. Bleeding: Bleeding is more common if the patient is taking blood thinners such as  aspirin, Coumadin, Ticiid, Plavix, etc., or if he/she have some genetic predisposition  such as hemophilia. Bleeding into the spinal canal can cause compression of the spinal  cord with subsequent paralysis.  This would require an emergency surgery to  decompress and there are no guarantees that the patient would recover from the  paralysis.       5. Pneumothorax:  Puncturing of a lung is a possibility, every time a needle is introduced in  the area of the chest or upper back.  Pneumothorax refers to free air around the  collapsed lung(s), inside of the thoracic cavity (chest cavity).  Another two possible  complications related to a similar event would include: Hemothorax and Chylothorax.   These are variations of the Pneumothorax, where instead of air around the collapsed  lung(s), you may have blood or chyle,  respectively.       6. Spinal headaches: They may occur with any procedures in the area of the spine.       7. Persistent CSF (Cerebro-Spinal Fluid) leakage: This is a rare problem, but may occur  with prolonged intrathecal or epidural catheters either due to the formation of a fistulous  track or a dural tear.       8. Nerve damage: By working so close to the spinal cord, there is always a possibility of  nerve damage, which could be as serious as a permanent spinal cord injury with  paralysis.       9. Death:  Although rare, severe deadly allergic reactions known as "Anaphylactic  reaction" can occur to any of the medications used.      10. Worsening of the symptoms:  We can always make thing worse.  What are the chances of something like this happening? Chances of any of this occuring are extremely low.  By statistics, you have more of a chance of getting killed in a motor vehicle accident: while driving to the hospital than any of the above occurring .  Nevertheless, you should be aware that they are possibilities.  In general, it is similar to taking a shower.  Everybody knows that you can slip, hit your head and get killed.  Does that mean that you should not shower again?  Nevertheless always keep in mind that statistics do not mean anything if you happen to be on the wrong side of them.  Even if a procedure has a 1 (one) in a 1,000,000 (million) chance of going wrong, it you happen to be that one..Also, keep in mind that by statistics, you have more of a chance of having something go wrong when taking medications.  Who should not have this procedure? If you are on a blood thinning medication (e.g. Coumadin, Plavix, see list of "Blood Thinners"), or if you have an active infection going on, you should not have the procedure.  If you are taking any blood thinners, please inform your physician.  How should I prepare for this procedure?  Do not eat or drink anything at least six hours prior to the  procedure.  Bring a driver with you .  It cannot be a taxi.  Come accompanied by an adult that can drive you back, and that is strong enough to help you if your legs get weak or numb from the local anesthetic.  Take all of your medicines the morning of the procedure with just enough water to swallow them.  If you have diabetes, make sure that you are scheduled to have your procedure done first thing in the morning, whenever possible.  If you have diabetes, take only half of your insulin dose and notify our nurse that you have done so as soon as you arrive at the clinic.  If you are diabetic, but only take blood sugar pills (oral hypoglycemic), then do not take them on the morning of your procedure.  You may take them after you have had the procedure.  Do not take aspirin or any aspirin-containing medications, at least eleven (11) days prior to the procedure.  They may prolong bleeding.  Wear loose fitting clothing that may be easy to take off and that you would not mind if it got stained with Betadine or blood.  Do not wear any jewelry or perfume  Remove any nail coloring.  It will interfere with some of our monitoring equipment.  NOTE: Remember that this is not meant to be interpreted as a complete list of all possible complications.  Unforeseen problems may occur.  BLOOD THINNERS The following drugs contain aspirin or other products, which can cause increased bleeding during surgery and should not be taken for 2 weeks prior to and 1 week after surgery.  If you should need take something for relief of minor pain, you may take acetaminophen which is found in Tylenol,m Datril, Anacin-3 and Panadol. It is not blood thinner. The products listed below are.  Do not take any of the products listed below in addition to any listed on your instruction sheet.  A.P.C or A.P.C with Codeine Codeine Phosphate Capsules #3 Ibuprofen Ridaura  ABC compound Congesprin Imuran rimadil  Advil Cope Indocin  Robaxisal  Alka-Seltzer Effervescent Pain Reliever and Antacid Coricidin or Coricidin-D  Indomethacin Rufen  Alka-Seltzer plus Cold Medicine Cosprin Ketoprofen S-A-C Tablets  Anacin Analgesic Tablets or Capsules Coumadin Korlgesic Salflex  Anacin  Extra Strength Analgesic tablets or capsules CP-2 Tablets Lanoril Salicylate  Anaprox Cuprimine Capsules Levenox Salocol  Anexsia-D Dalteparin Magan Salsalate  Anodynos Darvon compound Magnesium Salicylate Sine-off  Ansaid Dasin Capsules Magsal Sodium Salicylate  Anturane Depen Capsules Marnal Soma  APF Arthritis pain formula Dewitt's Pills Measurin Stanback  Argesic Dia-Gesic Meclofenamic Sulfinpyrazone  Arthritis Bayer Timed Release Aspirin Diclofenac Meclomen Sulindac  Arthritis pain formula Anacin Dicumarol Medipren Supac  Analgesic (Safety coated) Arthralgen Diffunasal Mefanamic Suprofen  Arthritis Strength Bufferin Dihydrocodeine Mepro Compound Suprol  Arthropan liquid Dopirydamole Methcarbomol with Aspirin Synalgos  ASA tablets/Enseals Disalcid Micrainin Tagament  Ascriptin Doan's Midol Talwin  Ascriptin A/D Dolene Mobidin Tanderil  Ascriptin Extra Strength Dolobid Moblgesic Ticlid  Ascriptin with Codeine Doloprin or Doloprin with Codeine Momentum Tolectin  Asperbuf Duoprin Mono-gesic Trendar  Aspergum Duradyne Motrin or Motrin IB Triminicin  Aspirin plain, buffered or enteric coated Durasal Myochrisine Trigesic  Aspirin Suppositories Easprin Nalfon Trillsate  Aspirin with Codeine Ecotrin Regular or Extra Strength Naprosyn Uracel  Atromid-S Efficin Naproxen Ursinus  Auranofin Capsules Elmiron Neocylate Vanquish  Axotal Emagrin Norgesic Verin  Azathioprine Empirin or Empirin with Codeine Normiflo Vitamin E  Azolid Emprazil Nuprin Voltaren  Bayer Aspirin plain, buffered or children's or timed BC Tablets or powders Encaprin Orgaran Warfarin Sodium  Buff-a-Comp Enoxaparin Orudis Zorpin  Buff-a-Comp with Codeine Equegesic Os-Cal-Gesic    Buffaprin Excedrin plain, buffered or Extra Strength Oxalid   Bufferin Arthritis Strength Feldene Oxphenbutazone   Bufferin plain or Extra Strength Feldene Capsules Oxycodone with Aspirin   Bufferin with Codeine Fenoprofen Fenoprofen Pabalate or Pabalate-SF   Buffets II Flogesic Panagesic   Buffinol plain or Extra Strength Florinal or Florinal with Codeine Panwarfarin   Buf-Tabs Flurbiprofen Penicillamine   Butalbital Compound Four-way cold tablets Penicillin   Butazolidin Fragmin Pepto-Bismol   Carbenicillin Geminisyn Percodan   Carna Arthritis Reliever Geopen Persantine   Carprofen Gold's salt Persistin   Chloramphenicol Goody's Phenylbutazone   Chloromycetin Haltrain Piroxlcam   Clmetidine heparin Plaquenil   Cllnoril Hyco-pap Ponstel   Clofibrate Hydroxy chloroquine Propoxyphen         Before stopping any of these medications, be sure to consult the physician who ordered them.  Some, such as Coumadin (Warfarin) are ordered to prevent or treat serious conditions such as "deep thrombosis", "pumonary embolisms", and other heart problems.  The amount of time that you may need off of the medication may also vary with the medication and the reason for which you were taking it.  If you are taking any of these medications, please make sure you notify your pain physician before you undergo any procedures.         Sodium Hyaluronate intra-articular injection What is this medicine? SODIUM HYALURONATE (SOE dee um hye al yoor ON ate) is used to treat pain in the knee due to osteoarthritis. This medicine may be used for other purposes; ask your health care provider or pharmacist if you have questions. COMMON BRAND NAME(S): Amvisc, DUROLANE, Euflexxa, GELSYN-3, Hyalgan, Monovisc, Orthovisc, Supartz, Supartz FX What should I tell my health care provider before I take this medicine? They need to know if you have any of these conditions: -bleeding disorders -glaucoma -infection in the knee  joint -skin conditions or sensitivity -skin infection -an unusual allergic reaction to sodium hyaluronate, other medicines, foods, dyes, or preservatives. Different brands of sodium hyaluronate contain different allergens. Some may contain egg. Talk to your doctor about your allergies to make sure that you get the right product. -pregnant or trying  to get pregnant -breast-feeding How should I use this medicine? This medicine is for injection into the knee joint. It is given by a health care professional in a hospital or clinic setting. Talk to your pediatrician regarding the use of this medicine in children. Special care may be needed. Overdosage: If you think you have taken too much of this medicine contact a poison control center or emergency room at once. NOTE: This medicine is only for you. Do not share this medicine with others. What if I miss a dose? This does not apply. What may interact with this medicine? Interactions are not expected. This list may not describe all possible interactions. Give your health care provider a list of all the medicines, herbs, non-prescription drugs, or dietary supplements you use. Also tell them if you smoke, drink alcohol, or use illegal drugs. Some items may interact with your medicine. What should I watch for while using this medicine? Tell your doctor or healthcare professional if your symptoms do not start to get better or if they get worse. If receiving this medicine for osteoarthritis, limit your activity after you receive your injection. Avoid physical activity for 48 hours following your injection to keep your knee from swelling. Do not stand on your feet for more than 1 hour at a time during the first 48 hours following your injection. Ask your doctor or healthcare professional about when you can begin major physical activity again. What side effects may I notice from receiving this medicine? Side effects that you should report to your doctor or  health care professional as soon as possible: -allergic reactions like skin rash, itching or hives, swelling of the face, lips, or tongue -dizziness -facial flushing -pain, tingling, numbness in the hands or feet -vision changes if received this medicine during eye surgery Side effects that usually do not require medical attention (report to your doctor or health care professional if they continue or are bothersome): -back pain -bruising at site where injected -chills -diarrhea -fever -headache -joint pain -joint stiffness -joint swelling -muscle cramps -muscle pain -nausea, vomiting -pain, redness, or irritation at site where injected -weak or tired This list may not describe all possible side effects. Call your doctor for medical advice about side effects. You may report side effects to FDA at 1-800-FDA-1088. Where should I keep my medicine? This drug is given in a hospital or clinic and will not be stored at home. NOTE: This sheet is a summary. It may not cover all possible information. If you have questions about this medicine, talk to your doctor, pharmacist, or health care provider.  2018 Elsevier/Gold Standard (2016-01-06 08:34:51)  If you have any questions, please call [phone] [facility]   ____________________________________________________________________________________________  Medication Rules  Applies to: All patients receiving prescriptions (written or electronic).  Pharmacy of record: Pharmacy where electronic prescriptions will be sent. If written prescriptions are taken to a different pharmacy, please inform the nursing staff. The pharmacy listed in the electronic medical record should be the one where you would like electronic prescriptions to be sent.  Prescription refills: Only during scheduled appointments. Applies to both, written and electronic prescriptions.  NOTE: The following applies primarily to  controlled substances (Opioid* Pain Medications).   Patient's responsibilities: 12. Pain Pills: Bring all pain pills to every appointment (except for procedure appointments). 13. Pill Bottles: Bring pills in original pharmacy bottle. Always bring newest bottle. Bring bottle, even if empty. 14. Medication refills: You are responsible for knowing  and keeping track of what medications you need refilled. The day before your appointment, write a list of all prescriptions that need to be refilled. Bring that list to your appointment and give it to the admitting nurse. Prescriptions will be written only during appointments. If you forget a medication, it will not be "Called in", "Faxed", or "electronically sent". You will need to get another appointment to get these prescribed. 15. Prescription Accuracy: You are responsible for carefully inspecting your prescriptions before leaving our office. Have the discharge nurse carefully go over each prescription with you, before taking them home. Make sure that your name is accurately spelled, that your address is correct. Check the name and dose of your medication to make sure it is accurate. Check the number of pills, and the written instructions to make sure they are clear and accurate. Make sure that you are given enough medication to last until your next medication refill appointment. 16. Taking Medication: Take medication as prescribed. Never take more pills than instructed. Never take medication more frequently than prescribed. Taking less pills or less frequently is permitted and encouraged, when it comes to controlled substances (written prescriptions).  17. Inform other Doctors: Always inform, all of your healthcare providers, of all the medications you take. 18. Pain Medication from other Providers: You are not allowed to accept any additional pain medication from any other Doctor or Healthcare provider. There are two exceptions to this rule. (see below) In the  event that you require additional pain medication, you are responsible for notifying us, as stated below. 19. Medication Agreement: You are responsible for carefully reading and following our Medication Agreement. This must be signed before receiving any prescriptions from our practice. Safely store a copy of your signed Agreement. Violations to the Agreement will result in no further prescriptions. (Additional copies of our Medication Agreement are available upon request.) 20. Laws, Rules, & Regulations: All patients are expected to follow all [address] and [employer], [employer], Rules, [place] [employer]. Ignorance of the Laws does not constitute a valid excuse. The use of any illegal substances is prohibited. 21. Adopted CDC guidelines & recommendations: Target dosing levels will be at or below 60 MME/day. Use of benzodiazepines** is not recommended.  Exceptions: There are only two exceptions to the rule of not receiving pain medications from other Healthcare Providers. 2. Exception #1 (Emergencies): In the event of an emergency (i.e.: accident requiring emergency care), you are allowed to receive additional pain medication. However, you are responsible for: As soon as you are able, call our office [phone], at any time of the day or night, and leave a message stating your name, the date and nature of the emergency, and the name and dose of the medication prescribed. In the event that your call is answered by a member of our staff, make sure to document and save the date, time, and the name of the person that took your information.  3. Exception #2 (Planned Surgery): In the event that you are scheduled by another doctor or dentist to have any type of surgery or procedure, you are allowed (for a period no longer than 30 days), to receive additional pain medication, for the acute post-op pain. However, in this case, you are responsible for picking up a copy of our "Post-op Pain Management for Surgeons"  handout, and giving it to your surgeon or dentist. This document is available at our office, and does not require an appointment to obtain it. Simply go to our office  during business hours (Monday-Thursday from 8:00 AM to 4:00 PM) (Friday 8:00 AM to 12:00 Noon) or if you have a scheduled appointment with [place], prior to your surgery, and ask for it by name. In addition, you will need to provide [place] with your name, name of your surgeon, type of surgery, and date of procedure or surgery.  *Opioid medications include: morphine, codeine, oxycodone, oxymorphone, hydrocodone, hydromorphone, meperidine, tramadol, tapentadol, buprenorphine, fentanyl, methadone. **Benzodiazepine medications include: diazepam (Valium), alprazolam (Xanax), clonazepam (Klonopine), lorazepam (Ativan), clorazepate (Tranxene), chlordiazepoxide (Librium), estazolam (Prosom), oxazepam (Serax), temazepam (Restoril), triazolam (Halcion) (Last updated: 02/14/2018) ____________________________________________________________________________________________   ____________________________________________________________________________________________  Pain Scale  Introduction: The pain score used by this practice is the Verbal Numerical Rating Scale (VNRS-11). This is an 11-point scale. It is for adults and children 10 years or older. There are significant differences in how the pain score is reported, used, and applied. Forget everything you learned in the past and learn this scoring system.  General Information: The scale should reflect your current level of pain. Unless you are specifically asked for the level of your worst pain, or your average pain. If you are asked for one of these two, then it should be understood that it is over the past 24 hours.  Basic Activities of Daily Living (ADL): Personal hygiene, dressing, eating, transferring, and using restroom.  Instructions: Most patients tend to report their level of pain as a  combination of two factors, their physical pain and their psychosocial pain. This last one is also known as "suffering" and it is reflection of how physical pain affects you socially and psychologically. From now on, report them separately. From this point on, when asked to report your pain level, report only your physical pain. Use the following table for reference.  Pain Clinic Pain Levels (0-5/10)  Pain Level Score  Description  No Pain 0   Mild pain 1 Nagging, annoying, but does not interfere with basic activities of daily living (ADL). Patients are able to eat, bathe, get dressed, toileting (being able to get on and off the toilet and perform personal hygiene functions), transfer (move in and out of bed or a chair without assistance), and maintain continence (able to control bladder and bowel functions). Blood pressure and heart rate are unaffected. A normal heart rate for a healthy adult ranges from 60 to 100 bpm (beats per minute).   Mild to moderate pain 2 Noticeable and distracting. Impossible to hide from other people. More frequent flare-ups. Still possible to adapt and function close to normal. It can be very annoying and may have occasional stronger flare-ups. With discipline, patients may get used to it and adapt.   Moderate pain 3 Interferes significantly with activities of daily living (ADL). It becomes difficult to feed, bathe, get dressed, get on and off the toilet or to perform personal hygiene functions. Difficult to get in and out of bed or a chair without assistance. Very distracting. With effort, it can be ignored when deeply involved in activities.   Moderately severe pain 4 Impossible to ignore for more than a few minutes. With effort, patients may still be able to manage work or participate in some social activities. Very difficult to concentrate. Signs of autonomic nervous system discharge are evident: dilated pupils (mydriasis); mild sweating (diaphoresis); sleep interference.  Heart rate becomes elevated (>115 bpm). Diastolic blood pressure (lower number) rises above 100 mmHg. Patients find relief in laying down and not moving.   Severe pain 5 Intense and extremely unpleasant.  Associated with frowning face and frequent crying. Pain overwhelms the senses.  Ability to do any activity or maintain social relationships becomes significantly limited. Conversation becomes difficult. Pacing back and forth is common, as getting into a comfortable position is nearly impossible. Pain wakes you up from deep sleep. Physical signs will be obvious: pupillary dilation; increased sweating; goosebumps; brisk reflexes; cold, clammy hands and feet; nausea, vomiting or dry heaves; loss of appetite; significant sleep disturbance with inability to fall asleep or to remain asleep. When persistent, significant weight loss is observed due to the complete loss of appetite and sleep deprivation.  Blood pressure and heart rate becomes significantly elevated. Caution: If elevated blood pressure triggers a pounding headache associated with blurred vision, then the patient should immediately seek attention at an urgent or emergency care unit, as these may be signs of an impending stroke.    Emergency Department Pain Levels (6-10/10)  Emergency Room Pain 6 Severely limiting. Requires emergency care and should not be seen or managed at an outpatient pain management facility. Communication becomes difficult and requires great effort. Assistance to reach the emergency department may be required. Facial flushing and profuse sweating along with potentially dangerous increases in heart rate and blood pressure will be evident.   Distressing pain 7 Self-care is very difficult. Assistance is required to transport, or use restroom. Assistance to reach the emergency department will be required. Tasks requiring coordination, such as bathing and getting dressed become very difficult.   Disabling pain 8 Self-care is no  longer possible. At this level, pain is disabling. The individual is unable to do even the most "basic" activities such as walking, eating, bathing, dressing, transferring to a bed, or toileting. Fine motor skills are lost. It is difficult to think clearly.   Incapacitating pain 9 Pain becomes incapacitating. Thought processing is no longer possible. Difficult to remember your own name. Control of movement and coordination are lost.   The worst pain imaginable 10 At this level, most patients pass out from pain. When this level is reached, collapse of the autonomic nervous system occurs, leading to a sudden drop in blood pressure and heart rate. This in turn results in a temporary and dramatic drop in blood flow to the brain, leading to a loss of consciousness. Fainting is one of the body's self defense mechanisms. Passing out puts the brain in a calmed state and causes it to shut down for a while, in order to begin the healing process.    Summary: 1. Refer to this scale when providing [place] with your pain level. 2. Be accurate and careful when reporting your pain level. This will help with your care. 3. Over-reporting your pain level will lead to loss of credibility. 4. Even a level of 1/10 means that there is pain and will be treated at our facility. 5. High, inaccurate reporting will be documented as "Symptom Exaggeration", leading to loss of credibility and suspicions of possible secondary gains such as obtaining more narcotics, or wanting to appear disabled, for fraudulent reasons. 6. Only pain levels of 5 or below will be seen at our facility. 7. Pain levels of 6 and above will be sent to the Emergency Department and the appointment cancelled. ____________________________________________________________________________________________   BMI Assessment: Estimated body mass index is 29.01 kg/m as calculated from the following:   Height as of this encounter:  (1.626 m).   Weight as of this  encounter: 169 lb (76.7 kg).  BMI interpretation table: BMI level Category Range  association with higher incidence of chronic pain  <18 kg/m2 Underweight   18.5-24.9 kg/m2 Ideal body weight   25-29.9 kg/m2 Overweight Increased incidence by 20%  30-34.9 kg/m2 Obese (Class I) Increased incidence by 68%  35-39.9 kg/m2 Severe obesity (Class II) Increased incidence by 136%  >40 kg/m2 Extreme obesity (Class III) Increased incidence by 254%   Patient's current BMI Ideal Body weight  Body mass index is 29.01 kg/m. Ideal body weight: 54.7 kg (120 lb 9.5 oz) Adjusted ideal body weight: 63.5 kg (139 lb 15.3 oz)   BMI Readings from Last 4 Encounters:  04/24/18 29.01 kg/m  04/04/18 29.12 kg/m  03/27/18 29.01 kg/m  03/05/18 29.18 kg/m   Wt Readings from Last 4 Encounters:  04/24/18 169 lb (76.7 kg)  04/04/18 175 lb (79.4 kg)  03/27/18 169 lb (76.7 kg)  03/05/18 170 lb (77.1 kg)

## 2018-04-25 ENCOUNTER — Other Ambulatory Visit: Payer: Self-pay | Admitting: Psychiatry

## 2018-04-25 ENCOUNTER — Ambulatory Visit: Payer: Medicare Other | Admitting: Pain Medicine

## 2018-04-30 ENCOUNTER — Ambulatory Visit: Payer: Medicare Other | Admitting: Pain Medicine

## 2018-05-02 ENCOUNTER — Ambulatory Visit: Payer: Medicare Other | Attending: Pain Medicine | Admitting: Pain Medicine

## 2018-05-02 ENCOUNTER — Ambulatory Visit: Payer: Medicare Other | Admitting: Pain Medicine

## 2018-05-02 ENCOUNTER — Encounter: Payer: Self-pay | Admitting: Pain Medicine

## 2018-05-02 ENCOUNTER — Other Ambulatory Visit: Payer: Self-pay

## 2018-05-02 VITALS — BP 147/99 | HR 97 | Temp 98.3°F | Resp 16 | Ht 64.0 in | Wt 165.0 lb

## 2018-05-02 DIAGNOSIS — M25562 Pain in left knee: Secondary | ICD-10-CM | POA: Diagnosis not present

## 2018-05-02 DIAGNOSIS — M25561 Pain in right knee: Secondary | ICD-10-CM | POA: Insufficient documentation

## 2018-05-02 DIAGNOSIS — Z981 Arthrodesis status: Secondary | ICD-10-CM | POA: Insufficient documentation

## 2018-05-02 DIAGNOSIS — M170 Bilateral primary osteoarthritis of knee: Secondary | ICD-10-CM | POA: Insufficient documentation

## 2018-05-02 DIAGNOSIS — G8929 Other chronic pain: Secondary | ICD-10-CM

## 2018-05-02 DIAGNOSIS — Z79899 Other long term (current) drug therapy: Secondary | ICD-10-CM | POA: Diagnosis not present

## 2018-05-02 DIAGNOSIS — Z7982 Long term (current) use of aspirin: Secondary | ICD-10-CM | POA: Insufficient documentation

## 2018-05-02 MED ORDER — ROPIVACAINE HCL 2 MG/ML IJ SOLN
4.0000 mL | Freq: Once | INTRAMUSCULAR | Status: AC
  Administered 2018-05-02: 10 mL via INTRA_ARTICULAR
  Filled 2018-05-02: qty 10

## 2018-05-02 MED ORDER — SODIUM HYALURONATE (VISCOSUP) 20 MG/2ML IX SOSY
2.0000 mL | PREFILLED_SYRINGE | Freq: Once | INTRA_ARTICULAR | Status: AC
  Administered 2018-05-02: 2 mL via INTRA_ARTICULAR

## 2018-05-02 MED ORDER — LIDOCAINE HCL (PF) 1 % IJ SOLN
4.0000 mL | Freq: Once | INTRAMUSCULAR | Status: AC
  Administered 2018-05-02: 5 mL
  Filled 2018-05-02: qty 5

## 2018-05-02 NOTE — Progress Notes (Signed)
Safety precautions to be maintained throughout the outpatient stay will include: orient to surroundings, keep bed in low position, maintain call bell within reach at all times, provide assistance with transfer out of bed and ambulation.  

## 2018-05-02 NOTE — Patient Instructions (Signed)
____________________________________________________________________________________________  Post-Procedure Discharge Instructions  Instructions:  Apply ice: Fill a plastic sandwich bag with crushed ice. Cover it with a small towel and apply to injection site. Apply for 15 minutes then remove x 15 minutes. Repeat sequence on day of procedure, until you go to bed. The purpose is to minimize swelling and discomfort after procedure.  Apply heat: Apply heat to procedure site starting the day following the procedure. The purpose is to treat any soreness and discomfort from the procedure.  Food intake: Start with clear liquids (like water) and advance to regular food, as tolerated.   Physical activities: Keep activities to a minimum for the first 8 hours after the procedure.   Driving: If you have received any sedation, you are not allowed to drive for 24 hours after your procedure.  Blood thinner: Restart your blood thinner 6 hours after your procedure. (Only for those taking blood thinners)  Insulin: As soon as you can eat, you may resume your normal dosing schedule. (Only for those taking insulin)  Infection prevention: Keep procedure site clean and dry.  Post-procedure Pain Diary: Extremely important that this be done correctly and accurately. Recorded information will be used to determine the next step in treatment.  Pain evaluated is that of treated area only. Do not include pain from an untreated area.  Complete every hour, on the hour, for the initial 8 hours. Set an alarm to help you do this part accurately.  Do not go to sleep and have it completed later. It will not be accurate.  Follow-up appointment: Keep your follow-up appointment after the procedure. Usually 2 weeks for most procedures. (6 weeks in the case of radiofrequency.) Bring you pain diary.   Expect:  From numbing medicine (AKA: Local Anesthetics): Numbness or decrease in pain.  Onset: Full effect within 15  minutes of injected.  Duration: It will depend on the type of local anesthetic used. On the average, 1 to 8 hours.   From steroids: Decrease in swelling or inflammation. Once inflammation is improved, relief of the pain will follow.  Onset of benefits: Depends on the amount of swelling present. The more swelling, the longer it will take for the benefits to be seen. In some cases, up to 10 days.  Duration: Steroids will stay in the system x 2 weeks. Duration of benefits will depend on multiple posibilities including persistent irritating factors.  From procedure: Some discomfort is to be expected once the numbing medicine wears off. This should be minimal if ice and heat are applied as instructed.  Call if:  You experience numbness and weakness that gets worse with time, as opposed to wearing off.  New onset bowel or bladder incontinence. (This applies to Spinal procedures only)  Emergency Numbers:  Durning business hours (Monday - Thursday, 8:00 AM - 4:00 PM) (Friday, 9:00 AM - 12:00 Noon): [phone]  After hours: [phone] ____________________________________________________________________________________________   ____________________________________________________________________________________________  Preparing for your procedure (without sedation)  Instructions: . Oral Intake: Do not eat or drink anything for at least 3 hours prior to your procedure. . Transportation: Unless otherwise stated by your physician, you may drive yourself after the procedure. . Blood Pressure Medicine: Take your blood pressure medicine with a sip of water the morning of the procedure. . Blood thinners:  . Diabetics on insulin: Notify the staff so that you can be scheduled 1st case in the morning. If your diabetes requires high dose insulin, take only  of your normal insulin dose   the morning of the procedure and notify the staff that you have done so. . Preventing infections: Shower  with an antibacterial soap the morning of your procedure.  . Build-up your immune system: Take 1000 mg of Vitamin C with every meal (3 times a day) the day prior to your procedure. . Antibiotics: Inform the staff if you have a condition or reason that requires you to take antibiotics before dental procedures. . Pregnancy: If you are pregnant, call and cancel the procedure. . Sickness: If you have a cold, fever, or any active infections, call and cancel the procedure. . Arrival: You must be in the facility at least 30 minutes prior to your scheduled procedure. . Children: Do not bring any children with you. . Dress appropriately: Bring dark clothing that you would not mind if they get stained. . Valuables: Do not bring any jewelry or valuables.  Procedure appointments are reserved for interventional treatments only. . No Prescription Refills. . No medication changes will be discussed during procedure appointments. . No disability issues will be discussed.  Remember:  Regular Business hours are:  Monday to Thursday 8:00 AM to 4:00 PM  Provider's Schedule: Reesa Gotschall, MD:  Procedure days: Tuesday and Thursday 7:30 AM to 4:00 PM  Bilal Lateef, MD:  Procedure days: Monday and Wednesday 7:30 AM to 4:00 PM ____________________________________________________________________________________________    

## 2018-05-02 NOTE — Progress Notes (Signed)
Patient's Name: Jean Gomez  MRN: [id number]  Referring Provider: Lauro Regulus, MD  DOB: 1956/02/03  PCP: Lauro Regulus, MD  DOS: 05/02/2018  Note by: Oswaldo Done, MD  Service setting: Ambulatory outpatient  Specialty: Interventional Pain Management  Patient type: Established  Location: [facility] (AMB) Pain Management Facility  Visit type: Interventional Procedure   Primary Reason for Visit: Interventional Pain Management Treatment. CC: Knee Pain (bilateral)  Procedure:  Anesthesia, Analgesia, Anxiolysis:  Type: Therapeutic Intra-Articular Hyalgan Knee Injection #2  Region: Lateral infrapatellar Knee Region Level: Knee Joint Laterality: Bilateral  Type: Local Anesthesia Indication(s): Analgesia         Local Anesthetic: Lidocaine 1-2% Route: Infiltration ([place]/IM) IV Access: Declined Sedation: Declined    Indications: 1. Osteoarthritis of knee (Bilateral) (R>L)   2. Chronic knee pain (Fourth Area of Pain) (Bilateral) (R>L)    Pain Score: Pre-procedure: 5 /10 Post-procedure: 0-No pain/10  Pre-op Assessment:  Jean Gomez is a 62 y.o. (year old), female patient, seen today for interventional treatment. She  has a past surgical history that includes Cholecystectomy; Abdominal hysterectomy; and Spinal fusion. Jean Gomez has a current medication list which includes the following prescription(s): albuterol sulfate, amlodipine, aspirin ec, atorvastatin, betamethasone (augmented), clotrimazole-betamethasone, diclofenac sodium, escitalopram, hydrochlorothiazide, hydrocodone-acetaminophen, hydrocodone-acetaminophen, ipratropium, ketoconazole, levetiracetam, losartan, nitroglycerin, pantoprazole, potassium chloride sa, quetiapine, sucralfate, tizanidine, torsemide, and venlafaxine. Her primarily concern today is the Knee Pain (bilateral)  Initial Vital Signs:  Pulse/HCG Rate: (!) 102  Temp: 98.3 F (36.8 C) Resp: 16 BP: (!) 142/94 SpO2: 100 %  BMI: Estimated body mass  index is 28.32 kg/m as calculated from the following:   Height as of this encounter:  (1.626 m).   Weight as of this encounter: 165 lb (74.8 kg).  Risk Assessment: Allergies: Reviewed. She is allergic to gabapentin; labetalol; sulfabenzamide; fentanyl; and penicillins.  Allergy Precautions: None required Coagulopathies: Reviewed. None identified.  Blood-thinner therapy: None at this time Active Infection(s): Reviewed. None identified. Jean Gomez is afebrile  Site Confirmation: Jean Gomez was asked to confirm the procedure and laterality before marking the site Procedure checklist: Completed Consent: Before the procedure and under the influence of no sedative(s), amnesic(s), or anxiolytics, the patient was informed of the treatment options, risks and possible complications. To fulfill our ethical and legal obligations, as recommended by the American Medical Association's Code of Ethics, I have informed the patient of my clinical impression; the nature and purpose of the treatment or procedure; the risks, benefits, and possible complications of the intervention; the alternatives, including doing nothing; the risk(s) and benefit(s) of the alternative treatment(s) or procedure(s); and the risk(s) and benefit(s) of doing nothing. The patient was provided information about the general risks and possible complications associated with the procedure. These may include, but are not limited to: failure to achieve desired goals, infection, bleeding, organ or nerve damage, allergic reactions, paralysis, and death. In addition, the patient was informed of those risks and complications associated to the procedure, such as failure to decrease pain; infection; bleeding; organ or nerve damage with subsequent damage to sensory, motor, and/or autonomic systems, resulting in permanent pain, numbness, and/or weakness of one or several areas of the body; allergic reactions; (i.e.: anaphylactic reaction); and/or  death. Furthermore, the patient was informed of those risks and complications associated with the medications. These include, but are not limited to: allergic reactions (i.e.: anaphylactic or anaphylactoid reaction(s)); adrenal axis suppression; blood sugar elevation that in diabetics may result in ketoacidosis or comma; water retention  that in patients with history of congestive heart failure may result in shortness of breath, pulmonary edema, and decompensation with resultant heart failure; weight gain; swelling or edema; medication-induced neural toxicity; particulate matter embolism and blood vessel occlusion with resultant organ, and/or nervous system infarction; and/or aseptic necrosis of one or more joints. Finally, the patient was informed that Medicine is not an exact science; therefore, there is also the possibility of unforeseen or unpredictable risks and/or possible complications that may result in a catastrophic outcome. The patient indicated having understood very clearly. We have given the patient no guarantees and we have made no promises. Enough time was given to the patient to ask questions, all of which were answered to the patient's satisfaction. Jean Gomez has indicated that she wanted to continue with the procedure. Attestation: I, the ordering provider, attest that I have discussed with the patient the benefits, risks, side-effects, alternatives, likelihood of achieving goals, and potential problems during recovery for the procedure that I have provided informed consent. Date  Time: 05/02/2018  9:07 AM  Pre-Procedure Preparation:  Monitoring: As per clinic protocol. Respiration, ETCO2, SpO2, BP, heart rate and rhythm monitor placed and checked for adequate function Safety Precautions: Patient was assessed for positional comfort and pressure points before starting the procedure. Time-out: I initiated and conducted the "Time-out" before starting the procedure, as per protocol. The  patient was asked to participate by confirming the accuracy of the "Time Out" information. Verification of the correct person, site, and procedure were performed and confirmed by me, the nursing staff, and the patient. "Time-out" conducted as per Joint Commission's Universal Protocol (UP.01.01.01). Time: 0938  Description of Procedure:       Position: Sitting Target Area: Knee Joint Approach: Just above the Lateral tibial plateau, lateral to the infrapatellar tendon. Area Prepped: Entire knee area, from the mid-thigh to the mid-shin. Prepping solution: ChloraPrep (2% chlorhexidine gluconate and 70% isopropyl alcohol) Safety Precautions: Aspiration looking for blood return was conducted prior to all injections. At no point did we inject any substances, as a needle was being advanced. No attempts were made at seeking any paresthesias. Safe injection practices and needle disposal techniques used. Medications properly checked for expiration dates. SDV (single dose vial) medications used. Description of the Procedure: Protocol guidelines were followed. The patient was placed in position over the fluoroscopy table. The target area was identified and the area prepped in the usual manner. Skin desensitized using vapocoolant spray. Skin & deeper tissues infiltrated with local anesthetic. Appropriate amount of time allowed to pass for local anesthetics to take effect. The procedure needles were then advanced to the target area. Proper needle placement secured. Negative aspiration confirmed. Solution injected in intermittent fashion, asking for systemic symptoms every 0.5cc of injectate. The needles were then removed and the area cleansed, making sure to leave some of the prepping solution back to take advantage of its long term bactericidal properties. Vitals:   05/02/18 0905 05/02/18 0941  BP: (!) 142/94 (!) 147/99  Pulse: (!) 102 97  Resp: 16 16  Temp: 98.3 F (36.8 C)   TempSrc: Oral   SpO2: 100% 100%   Weight: 165 lb (74.8 kg)   Height:  (1.626 m)     Start Time: 0938 hrs. End Time: 0940 hrs. Materials:  Needle(s) Type: Regular needle Gauge: 22G Length: 3.5-in Medication(s): Please see orders for medications and dosing details.  Imaging Guidance:  Type of Imaging Technique: None used Indication(s): N/A Exposure Time: No patient exposure Contrast: None  used. Fluoroscopic Guidance: N/A Ultrasound Guidance: N/A Interpretation: N/A  Antibiotic Prophylaxis:   Anti-infectives (From admission, onward)   None     Indication(s): None identified  Post-operative Assessment:  Post-procedure Vital Signs:  Pulse/HCG Rate: 97  Temp: 98.3 F (36.8 C) Resp: 16 BP: (!) 147/99 SpO2: 100 %  EBL: None  Complications: No immediate post-treatment complications observed by team, or reported by patient.  Note: The patient tolerated the entire procedure well. A repeat set of vitals were taken after the procedure and the patient was kept under observation following institutional policy, for this type of procedure. Post-procedural neurological assessment was performed, showing return to baseline, prior to discharge. The patient was provided with post-procedure discharge instructions, including a section on how to identify potential problems. Should any problems arise concerning this procedure, the patient was given instructions to immediately contact us, at any time, without hesitation. In any case, we plan to contact the patient by telephone for a follow-up status report regarding this interventional procedure.  Comments:  No additional relevant information.  Plan of Care   Imaging Orders  No imaging studies ordered today    Procedure Orders     KNEE INJECTION     KNEE INJECTION  Medications ordered for procedure: Meds ordered this encounter  Medications  . lidocaine (PF) (XYLOCAINE) 1 % injection 4 mL  . ropivacaine (PF) 2 mg/mL (0.2%) (NAROPIN) injection 4 mL  . Sodium  Hyaluronate SOSY 2 mL  . Sodium Hyaluronate SOSY 2 mL   Medications administered: We administered lidocaine (PF), ropivacaine (PF) 2 mg/mL (0.2%), Sodium Hyaluronate, and Sodium Hyaluronate.  See the medical record for exact dosing, route, and time of administration.  New Prescriptions   No medications on file   Disposition: Discharge home  Discharge Date & Time: 05/02/2018; 0942 hrs.   Physician-requested Follow-up: Return for PPE (2 wks) + Procedure (no sedation): (B) Hyalgan #3.  Future Appointments  Date Time Provider Department Center  05/07/2018  9:15 AM Delano Metz, MD [facility] None  05/23/2018  9:00 AM Delano Metz, MD [facility] None  06/18/2018  8:45 AM Barbette Merino, NP [facility] None   Primary Care Physician: Lauro Regulus, MD Location: [facility] Note by: Oswaldo Done, MD Date: 05/02/2018; Time: 10:10 AM  Disclaimer:  Medicine is not an Visual merchandiser. The only guarantee in medicine is that nothing is guaranteed. It is important to note that the decision to proceed with this intervention was based on the information collected from the patient. The Data and conclusions were drawn from the patient's questionnaire, the interview, and the physical examination. Because the information was provided in large part by the patient, it cannot be guaranteed that it has not been purposely or unconsciously manipulated. Every effort has been made to obtain as much relevant data as possible for this evaluation. It is important to note that the conclusions that lead to this procedure are derived in large part from the available data. Always take into account that the treatment will also be dependent on availability of resources and existing treatment guidelines, considered by other Pain Management Practitioners as being common knowledge and practice, at the time of the intervention. For Medico-Legal purposes, it is also important to point out  that variation in procedural techniques and pharmacological choices are the acceptable norm. The indications, contraindications, technique, and results of the above procedure should only be interpreted and judged by a Board-Certified Interventional Pain Specialist with extensive familiarity and expertise in  the same exact procedure and technique.

## 2018-05-03 ENCOUNTER — Telehealth: Payer: Self-pay

## 2018-05-03 NOTE — Telephone Encounter (Signed)
POst procedure phone call.  Patient states she is doing good.  

## 2018-05-07 ENCOUNTER — Ambulatory Visit (HOSPITAL_BASED_OUTPATIENT_CLINIC_OR_DEPARTMENT_OTHER): Payer: Medicare Other | Admitting: Pain Medicine

## 2018-05-07 ENCOUNTER — Encounter: Payer: Self-pay | Admitting: Pain Medicine

## 2018-05-07 ENCOUNTER — Other Ambulatory Visit: Payer: Self-pay

## 2018-05-07 ENCOUNTER — Ambulatory Visit
Admission: RE | Admit: 2018-05-07 | Discharge: 2018-05-07 | Disposition: A | Discharge status: Home / Self Care | Payer: Medicare Other | Source: Ambulatory Visit | Attending: Pain Medicine | Admitting: Pain Medicine

## 2018-05-07 VITALS — BP 122/78 | HR 85 | Temp 98.2°F | Resp 16

## 2018-05-07 DIAGNOSIS — M5030 Other cervical disc degeneration, unspecified cervical region: Secondary | ICD-10-CM | POA: Insufficient documentation

## 2018-05-07 DIAGNOSIS — M542 Cervicalgia: Secondary | ICD-10-CM

## 2018-05-07 DIAGNOSIS — Z79899 Other long term (current) drug therapy: Secondary | ICD-10-CM | POA: Diagnosis not present

## 2018-05-07 DIAGNOSIS — Z9049 Acquired absence of other specified parts of digestive tract: Secondary | ICD-10-CM | POA: Diagnosis not present

## 2018-05-07 DIAGNOSIS — M5481 Occipital neuralgia: Secondary | ICD-10-CM

## 2018-05-07 DIAGNOSIS — R51 Headache: Secondary | ICD-10-CM | POA: Diagnosis present

## 2018-05-07 DIAGNOSIS — M25561 Pain in right knee: Secondary | ICD-10-CM | POA: Diagnosis present

## 2018-05-07 DIAGNOSIS — M47812 Spondylosis without myelopathy or radiculopathy, cervical region: Secondary | ICD-10-CM

## 2018-05-07 DIAGNOSIS — M170 Bilateral primary osteoarthritis of knee: Secondary | ICD-10-CM | POA: Diagnosis not present

## 2018-05-07 DIAGNOSIS — Z981 Arthrodesis status: Secondary | ICD-10-CM | POA: Diagnosis not present

## 2018-05-07 DIAGNOSIS — R519 Headache, unspecified: Secondary | ICD-10-CM

## 2018-05-07 DIAGNOSIS — M25562 Pain in left knee: Secondary | ICD-10-CM

## 2018-05-07 DIAGNOSIS — J449 Chronic obstructive pulmonary disease, unspecified: Secondary | ICD-10-CM

## 2018-05-07 DIAGNOSIS — G8929 Other chronic pain: Secondary | ICD-10-CM

## 2018-05-07 MED ORDER — LORAZEPAM 2 MG/ML IJ SOLN: 0.0000 mg | Freq: Two times a day (BID) | INTRAMUSCULAR | Status: DC

## 2018-05-07 MED ORDER — ROPIVACAINE HCL 2 MG/ML IJ SOLN
9.0000 mL | Freq: Once | INTRAMUSCULAR | Status: AC
  Administered 2018-05-07: 10 mL
  Filled 2018-05-07: qty 10

## 2018-05-07 MED ORDER — SODIUM HYALURONATE (VISCOSUP) 20 MG/2ML IX SOSY
2.0000 mL | PREFILLED_SYRINGE | Freq: Once | INTRA_ARTICULAR | Status: AC
  Administered 2018-05-07: 11:00:00 via INTRA_ARTICULAR

## 2018-05-07 MED ORDER — VITAMIN B-1 100 MG PO TABS: 100.0000 mg | ORAL_TABLET | Freq: Every day | ORAL | Status: DC

## 2018-05-07 MED ORDER — MIDAZOLAM HCL 5 MG/5ML IJ SOLN
1.0000 mg | INTRAMUSCULAR | Status: DC | PRN
  Administered 2018-05-07: 2 mg via INTRAVENOUS
  Filled 2018-05-07: qty 5

## 2018-05-07 MED ORDER — DEXAMETHASONE SODIUM PHOSPHATE 10 MG/ML IJ SOLN
10.0000 mg | Freq: Once | INTRAMUSCULAR | Status: AC
  Administered 2018-05-07: 10 mg
  Filled 2018-05-07: qty 1

## 2018-05-07 MED ORDER — THIAMINE HCL 100 MG/ML IJ SOLN
100.0000 mg | Freq: Every day | INTRAMUSCULAR | Status: DC
  Filled 2018-05-07: qty 1

## 2018-05-07 MED ORDER — LIDOCAINE HCL 2 % IJ SOLN
20.0000 mL | Freq: Once | INTRAMUSCULAR | Status: AC
  Administered 2018-05-07: 400 mg
  Filled 2018-05-07: qty 100

## 2018-05-07 MED ORDER — LORAZEPAM 2 MG PO TABS: 0.0000 mg | ORAL_TABLET | Freq: Two times a day (BID) | ORAL | Status: DC

## 2018-05-07 MED ORDER — FENTANYL CITRATE (PF) 100 MCG/2ML IJ SOLN
25.0000 ug | INTRAMUSCULAR | Status: DC | PRN
  Administered 2018-05-07: 100 ug via INTRAVENOUS
  Filled 2018-05-07: qty 2

## 2018-05-07 MED ORDER — LORAZEPAM 2 MG PO TABS: 0.0000 mg | ORAL_TABLET | Freq: Four times a day (QID) | ORAL | Status: DC

## 2018-05-07 MED ORDER — LORAZEPAM 2 MG/ML IJ SOLN: 0.0000 mg | Freq: Four times a day (QID) | INTRAMUSCULAR | Status: DC

## 2018-05-07 MED ORDER — LACTATED RINGERS IV SOLN
1000.0000 mL | Freq: Once | INTRAVENOUS | Status: AC
  Administered 2018-05-07: 1000 mL via INTRAVENOUS

## 2018-05-07 MED ORDER — LIDOCAINE HCL (PF) 1 % IJ SOLN
4.0000 mL | Freq: Once | INTRAMUSCULAR | Status: AC
  Administered 2018-05-07: 5 mL
  Filled 2018-05-07: qty 5

## 2018-05-07 MED ORDER — DEXAMETHASONE SODIUM PHOSPHATE 10 MG/ML IJ SOLN
INTRAMUSCULAR | Status: AC
  Filled 2018-05-07: qty 1

## 2018-05-07 MED ORDER — ROPIVACAINE HCL 2 MG/ML IJ SOLN
4.0000 mL | Freq: Once | INTRAMUSCULAR | Status: AC
  Administered 2018-05-07: 10 mL via INTRA_ARTICULAR
  Filled 2018-05-07: qty 10

## 2018-05-07 NOTE — Patient Instructions (Signed)
____________________________________________________________________________________________  Post-Procedure Discharge Instructions  Instructions:  Apply ice: Fill a plastic sandwich bag with crushed ice. Cover it with a small towel and apply to injection site. Apply for 15 minutes then remove x 15 minutes. Repeat sequence on day of procedure, until you go to bed. The purpose is to minimize swelling and discomfort after procedure.  Apply heat: Apply heat to procedure site starting the day following the procedure. The purpose is to treat any soreness and discomfort from the procedure.  Food intake: Start with clear liquids (like water) and advance to regular food, as tolerated.   Physical activities: Keep activities to a minimum for the first 8 hours after the procedure.   Driving: If you have received any sedation, you are not allowed to drive for 24 hours after your procedure.  Blood thinner: Restart your blood thinner 6 hours after your procedure. (Only for those taking blood thinners)  Insulin: As soon as you can eat, you may resume your normal dosing schedule. (Only for those taking insulin)  Infection prevention: Keep procedure site clean and dry.  Post-procedure Pain Diary: Extremely important that this be done correctly and accurately. Recorded information will be used to determine the next step in treatment.  Pain evaluated is that of treated area only. Do not include pain from an untreated area.  Complete every hour, on the hour, for the initial 8 hours. Set an alarm to help you do this part accurately.  Do not go to sleep and have it completed later. It will not be accurate.  Follow-up appointment: Keep your follow-up appointment after the procedure. Usually 2 weeks for most procedures. (6 weeks in the case of radiofrequency.) Bring you pain diary.   Expect:  From numbing medicine (AKA: Local Anesthetics): Numbness or decrease in pain.  Onset: Full effect within 15  minutes of injected.  Duration: It will depend on the type of local anesthetic used. On the average, 1 to 8 hours.   From steroids: Decrease in swelling or inflammation. Once inflammation is improved, relief of the pain will follow.  Onset of benefits: Depends on the amount of swelling present. The more swelling, the longer it will take for the benefits to be seen. In some cases, up to 10 days.  Duration: Steroids will stay in the system x 2 weeks. Duration of benefits will depend on multiple posibilities including persistent irritating factors.  From procedure: Some discomfort is to be expected once the numbing medicine wears off. This should be minimal if ice and heat are applied as instructed.  Call if:  You experience numbness and weakness that gets worse with time, as opposed to wearing off.  New onset bowel or bladder incontinence. (This applies to Spinal procedures only)  Emergency Numbers:  Durning business hours (Monday - Thursday, 8:00 AM - 4:00 PM) (Friday, 9:00 AM - 12:00 Noon): [phone]  After hours: [phone] ____________________________________________________________________________________________   ____________________________________________________________________________________________  Preparing for your procedure (without sedation)  Instructions: . Oral Intake: Do not eat or drink anything for at least 3 hours prior to your procedure. . Transportation: Unless otherwise stated by your physician, you may drive yourself after the procedure. . Blood Pressure Medicine: Take your blood pressure medicine with a sip of water the morning of the procedure. . Blood thinners:  . Diabetics on insulin: Notify the staff so that you can be scheduled 1st case in the morning. If your diabetes requires high dose insulin, take only  of your normal insulin dose   the morning of the procedure and notify the staff that you have done so. . Preventing infections: Shower  with an antibacterial soap the morning of your procedure.  . Build-up your immune system: Take 1000 mg of Vitamin C with every meal (3 times a day) the day prior to your procedure. . Antibiotics: Inform the staff if you have a condition or reason that requires you to take antibiotics before dental procedures. . Pregnancy: If you are pregnant, call and cancel the procedure. . Sickness: If you have a cold, fever, or any active infections, call and cancel the procedure. . Arrival: You must be in the facility at least 30 minutes prior to your scheduled procedure. . Children: Do not bring any children with you. . Dress appropriately: Bring dark clothing that you would not mind if they get stained. . Valuables: Do not bring any jewelry or valuables.  Procedure appointments are reserved for interventional treatments only. . No Prescription Refills. . No medication changes will be discussed during procedure appointments. . No disability issues will be discussed.  Remember:  Regular Business hours are:  Monday to Thursday 8:00 AM to 4:00 PM  Provider's Schedule: Toren Tucholski, MD:  Procedure days: Tuesday and Thursday 7:30 AM to 4:00 PM  Bilal Lateef, MD:  Procedure days: Monday and Wednesday 7:30 AM to 4:00 PM ____________________________________________________________________________________________    

## 2018-05-07 NOTE — Progress Notes (Addendum)
Patient's Name: Jean Gomez  MRN: [id number]  Referring Provider: Lauro Regulus, MD  DOB: 1956/07/30  PCP: Lauro Regulus, MD  DOS: 05/07/2018  Note by: Oswaldo Done, MD  Service setting: Ambulatory outpatient  Specialty: Interventional Pain Management  Patient type: Established  Location: [facility] (AMB) Pain Management Facility  Visit type: Interventional Procedure   Primary Reason for Visit: Interventional Pain Management Treatment. CC: Headache and Knee Pain (bilateral)  Procedure #1:  Anesthesia, Analgesia, Anxiolysis:  Type: Therapeutic Intra-Articular Hyalgan Knee Injection #3  Region: Lateral infrapatellar Knee Region Level: Knee Joint Laterality: Bilateral  Type: Local Anesthesia Indication(s): Analgesia         Local Anesthetic: Lidocaine 1-2% Route: Infiltration ([place]/IM) IV Access: Declined Sedation: Declined    Indications: 1. Osteoarthritis of knee (Bilateral) (R>L)   2. Chronic knee pain (Fourth Area of Pain) (Bilateral) (R>L)    Procedure #2:  Anesthesia, Analgesia, Anxiolysis:  Type: Diagnostic, Greater, Occipital Nerve Block #2  Region: Posterolateral Cervical Level: Occipital Ridge   Laterality: Bilateral  Type: Moderate (Conscious) Sedation combined with Local Anesthesia Indication(s): Analgesia and Anxiety Route: Intravenous (IV) IV Access: Secured Sedation: Meaningful verbal contact was maintained at all times during the procedure  Local Anesthetic: Lidocaine 1-2%   Indications: 1. Occipital neuralgia (Primary Area of Pain) (Bilateral) (R>L)   2. Occipital headache (Right)   3. DDD (degenerative disc disease), cervical   4. Chronic neck pain (Bilateral) (R>L)   5. Cervical facet arthropathy (Bilateral)    Pain Score: Pre-procedure: 5 /10 Post-procedure: 0-No pain/10  Pre-op Assessment:  Jean Gomez is a 62 y.o. (year old), female patient, seen today for interventional treatment. She  has a past surgical history that includes  Cholecystectomy; Abdominal hysterectomy; and Spinal fusion. Ms. Rease has a current medication list which includes the following prescription(s): albuterol sulfate, amlodipine, aspirin ec, atorvastatin, betamethasone (augmented), clotrimazole-betamethasone, diclofenac sodium, escitalopram, hydrochlorothiazide, hydrocodone-acetaminophen, hydrocodone-acetaminophen, ipratropium, ketoconazole, levetiracetam, losartan, nitroglycerin, pantoprazole, potassium chloride sa, quetiapine, sucralfate, tizanidine, torsemide, and venlafaxine, and the following Facility-Administered Medications: fentanyl, lorazepam **OR** lorazepam, lorazepam **OR** lorazepam, midazolam, and thiamine **OR** thiamine. Her primarily concern today is the Headache and Knee Pain (bilateral)  Initial Vital Signs:  Pulse/HCG Rate: 85ECG Heart Rate: 88 Temp: 98.4 F (36.9 C) BP: 124/88 SpO2: 100 %  BMI: Estimated body mass index is 28.32 kg/m as calculated from the following:   Height as of 05/02/18:  (1.626 m).   Weight as of 05/02/18: 165 lb (74.8 kg).  Risk Assessment: Allergies: Reviewed. She is allergic to gabapentin; labetalol; sulfabenzamide; fentanyl; and penicillins.  Allergy Precautions: None required Coagulopathies: Reviewed. None identified.  Blood-thinner therapy: None at this time Active Infection(s): Reviewed. None identified. Jean Gomez is afebrile  Site Confirmation: Jean Gomez was asked to confirm the procedure and laterality before marking the site Procedure checklist: Completed Consent: Before the procedure and under the influence of no sedative(s), amnesic(s), or anxiolytics, the patient was informed of the treatment options, risks and possible complications. To fulfill our ethical and legal obligations, as recommended by the American Medical Association's Code of Ethics, I have informed the patient of my clinical impression; the nature and purpose of the treatment or procedure; the risks, benefits, and possible  complications of the intervention; the alternatives, including doing nothing; the risk(s) and benefit(s) of the alternative treatment(s) or procedure(s); and the risk(s) and benefit(s) of doing nothing. The patient was provided information about the general risks and possible complications associated with the procedure. These may include, but  are not limited to: failure to achieve desired goals, infection, bleeding, organ or nerve damage, allergic reactions, paralysis, and death. In addition, the patient was informed of those risks and complications associated to the procedure, such as failure to decrease pain; infection; bleeding; organ or nerve damage with subsequent damage to sensory, motor, and/or autonomic systems, resulting in permanent pain, numbness, and/or weakness of one or several areas of the body; allergic reactions; (i.e.: anaphylactic reaction); and/or death. Furthermore, the patient was informed of those risks and complications associated with the medications. These include, but are not limited to: allergic reactions (i.e.: anaphylactic or anaphylactoid reaction(s)); adrenal axis suppression; blood sugar elevation that in diabetics may result in ketoacidosis or comma; water retention that in patients with history of congestive heart failure may result in shortness of breath, pulmonary edema, and decompensation with resultant heart failure; weight gain; swelling or edema; medication-induced neural toxicity; particulate matter embolism and blood vessel occlusion with resultant organ, and/or nervous system infarction; and/or aseptic necrosis of one or more joints. Finally, the patient was informed that Medicine is not an exact science; therefore, there is also the possibility of unforeseen or unpredictable risks and/or possible complications that may result in a catastrophic outcome. The patient indicated having understood very clearly. We have given the patient no guarantees and we have made no  promises. Enough time was given to the patient to ask questions, all of which were answered to the patient's satisfaction. Jean Gomez has indicated that she wanted to continue with the procedure. Attestation: I, the ordering provider, attest that I have discussed with the patient the benefits, risks, side-effects, alternatives, likelihood of achieving goals, and potential problems during recovery for the procedure that I have provided informed consent. Date  Time: 05/07/2018  9:13 AM  Pre-Procedure Preparation:  Monitoring: As per clinic protocol. Respiration, ETCO2, SpO2, BP, heart rate and rhythm monitor placed and checked for adequate function Safety Precautions: Patient was assessed for positional comfort and pressure points before starting the procedure. Time-out: I initiated and conducted the "Time-out" before starting the procedure, as per protocol. The patient was asked to participate by confirming the accuracy of the "Time Out" information. Verification of the correct person, site, and procedure were performed and confirmed by me, the nursing staff, and the patient. "Time-out" conducted as per Joint Commission's Universal Protocol (UP.01.01.01). Time: 1610(9604 for knees)  Description of Procedure #1:  Position: Sitting Target Area: Knee Joint Approach: Just above the Lateral tibial plateau, lateral to the infrapatellar tendon. Area Prepped: Entire knee area, from the mid-thigh to the mid-shin. Prepping solution: ChloraPrep (2% chlorhexidine gluconate and 70% isopropyl alcohol) Safety Precautions: Aspiration looking for blood return was conducted prior to all injections. At no point did we inject any substances, as a needle was being advanced. No attempts were made at seeking any paresthesias. Safe injection practices and needle disposal techniques used. Medications properly checked for expiration dates. SDV (single dose vial) medications used. Description of the Procedure: Protocol  guidelines were followed. The patient was placed in position over the fluoroscopy table. The target area was identified and the area prepped in the usual manner. Skin desensitized using vapocoolant spray. Skin & deeper tissues infiltrated with local anesthetic. Appropriate amount of time allowed to pass for local anesthetics to take effect. The procedure needles were then advanced to the target area. Proper needle placement secured. Negative aspiration confirmed. Solution injected in intermittent fashion, asking for systemic symptoms every 0.5cc of injectate. The needles were then removed and the  area cleansed, making sure to leave some of the prepping solution back to take advantage of its long term bactericidal properties.  Start Time: 1610(9604 ) hrs.  Materials:  Needle(s) Type: Regular needle Gauge: 25G Length: 1.5-in Medication(s): Please see orders for medications and dosing details.  Imaging Guidance for procedure #1:  Type of Imaging Technique: None used Indication(s): N/A Exposure Time: No patient exposure Contrast: None used. Fluoroscopic Guidance: N/A Ultrasound Guidance: N/A Interpretation: N/A  Description of Procedure #2:   Position: Prone Target Area: Area medial to the occipital artery at the level of the superior nuchal ridge Approach: Posterior approach Area Prepped: Entire Posterior Occipital Region Prepping solution: ChloraPrep (2% chlorhexidine gluconate and 70% isopropyl alcohol) Safety Precautions: Aspiration looking for blood return was conducted prior to all injections. At no point did we inject any substances, as a needle was being advanced. No attempts were made at seeking any paresthesias. Safe injection practices and needle disposal techniques used. Medications properly checked for expiration dates. SDV (single dose vial) medications used. Description of the Procedure: Protocol guidelines were followed. The target area was identified and the area prepped in the  usual manner. Skin & deeper tissues infiltrated with local anesthetic. Appropriate amount of time allowed to pass for local anesthetics to take effect. The procedure needles were then advanced to the target area. Proper needle placement secured. Negative aspiration confirmed. Solution injected in intermittent fashion, asking for systemic symptoms every 0.5cc of injectate. The needles were then removed and the area cleansed, making sure to leave some of the prepping solution back to take advantage of its long term bactericidal properties.  Vitals:   05/07/18 1052 05/07/18 1100 05/07/18 1110 05/07/18 1118  BP: 120/87 111/90 120/70 122/78  Pulse:      Resp: Temp:  98 F (36.7 C)  98.2 F (36.8 C)  SpO2: (!) 88% (!) 83% 90% 96%   End Time: 5409(8119) hrs. Materials:  Needle(s) Type: Regular needle Gauge: 22G Length: 3.5-in Medication(s): Please see orders for medications and dosing details.  Imaging Guidance for procedure #2 (Non-Spinal):  Type of Imaging Technique: Fluoroscopy Guidance (Non-Spinal) Indication(s): Assistance in needle guidance and placement for procedures requiring needle placement in or near specific anatomical locations not easily accessible without such assistance. Exposure Time: Please see nurses notes. Contrast: None used. Fluoroscopic Guidance: I was personally present during the use of fluoroscopy. "Tunnel Vision Technique" used to obtain the best possible view of the target area. Parallax error corrected before commencing the procedure. "Direction-depth-direction" technique used to introduce the needle under continuous pulsed fluoroscopy. Once target was reached, antero-posterior, oblique, and lateral fluoroscopic projection used confirm needle placement in all planes. Images permanently stored in EMR. Interpretation: No contrast injected. I personally interpreted the imaging intraoperatively. Adequate needle placement confirmed in multiple planes. Permanent  images saved into the patient's record.  Antibiotic Prophylaxis:   Anti-infectives (From admission, onward)   None     Indication(s): None identified  Post-operative Assessment:  Post-procedure Vital Signs:  Pulse/HCG Rate: 8580 Temp: 98.2 F (36.8 C) Resp: 16 BP: 122/78 SpO2: 96 %  EBL: None  Complications: No immediate post-treatment complications observed by team, or reported by patient.  Note: The patient tolerated the entire procedure well. A repeat set of vitals were taken after the procedure and the patient was kept under observation following institutional policy, for this type of procedure. Post-procedural neurological assessment was performed, showing return to baseline, prior to discharge. The patient was provided with  post-procedure discharge instructions, including a section on how to identify potential problems. Should any problems arise concerning this procedure, the patient was given instructions to immediately contact us, at any time, without hesitation. In any case, we plan to contact the patient by telephone for a follow-up status report regarding this interventional procedure.  Comments:  No additional relevant information.  Plan of Care   Possible POC:  Therapeutic bilateral intra-articular Hyalgan knee injection #4    Imaging Orders     DG C-Arm 1-60 Min-No Report  Procedure Orders     KNEE INJECTION     KNEE INJECTION     GREATER OCCIPITAL NERVE BLOCK  Medications ordered for procedure: Meds ordered this encounter  Medications  . lidocaine (XYLOCAINE) 2 % (with pres) injection 400 mg  . midazolam (VERSED) 5 MG/5ML injection 1-2 mg    Make sure Flumazenil is available in the pyxis when using this medication. If oversedation occurs, administer 0.2 mg IV over 15 sec. If after 45 sec no response, administer 0.2 mg again over 1 min; may repeat at 1 min intervals; not to exceed 4 doses (1 mg)  . fentaNYL (SUBLIMAZE) injection 25-50 mcg    Make sure  Narcan is available in the pyxis when using this medication. In the event of respiratory depression (RR< 8/min): Titrate NARCAN (naloxone) in increments of 0.1 to 0.2 mg IV at 2-3 minute intervals, until desired degree of reversal.  . lactated ringers infusion 1,000 mL  . lidocaine (PF) (XYLOCAINE) 1 % injection 4 mL  . ropivacaine (PF) 2 mg/mL (0.2%) (NAROPIN) injection 4 mL  . Sodium Hyaluronate SOSY 2 mL  . Sodium Hyaluronate SOSY 2 mL  . ropivacaine (PF) 2 mg/mL (0.2%) (NAROPIN) injection 9 mL  . dexamethasone (DECADRON) injection 10 mg   Medications administered: We administered lidocaine, midazolam, fentaNYL, lactated ringers, lidocaine (PF), ropivacaine (PF) 2 mg/mL (0.2%), Sodium Hyaluronate, Sodium Hyaluronate, ropivacaine (PF) 2 mg/mL (0.2%), and dexamethasone.  See the medical record for exact dosing, route, and time of administration.  New Prescriptions   No medications on file   Disposition: Discharge home  Discharge Date & Time: 05/07/2018; 1119 hrs.   Physician-requested Follow-up: Return for PPE (2 wks) + Procedure (no sedation):  (B) Hyalgan #4.  Future Appointments  Date Time Provider Department Center  05/23/2018  9:00 AM Delano Metz, MD [facility] None  06/18/2018  8:45 AM Barbette Merino, NP [facility] None   Primary Care Physician: Lauro Regulus, MD Location: [facility] Note by: Oswaldo Done, MD Date: 05/07/2018; Time: 12:24 PM  Disclaimer:  Medicine is not an Visual merchandiser. The only guarantee in medicine is that nothing is guaranteed. It is important to note that the decision to proceed with this intervention was based on the information collected from the patient. The Data and conclusions were drawn from the patient's questionnaire, the interview, and the physical examination. Because the information was provided in large part by the patient, it cannot be guaranteed that it has not been purposely or unconsciously  manipulated. Every effort has been made to obtain as much relevant data as possible for this evaluation. It is important to note that the conclusions that lead to this procedure are derived in large part from the available data. Always take into account that the treatment will also be dependent on availability of resources and existing treatment guidelines, considered by other Pain Management Practitioners as being common knowledge and practice, at the time of the intervention.  For Medico-Legal purposes, it is also important to point out that variation in procedural techniques and pharmacological choices are the acceptable norm. The indications, contraindications, technique, and results of the above procedure should only be interpreted and judged by a Board-Certified Interventional Pain Specialist with extensive familiarity and expertise in the same exact procedure and technique.

## 2018-05-07 NOTE — Progress Notes (Signed)
25Safety precautions to be maintained throughout the outpatient stay will include: orient to surroundings, keep bed in low position, maintain call bell within reach at all times, provide assistance with transfer out of bed and ambulation.

## 2018-05-08 ENCOUNTER — Telehealth: Payer: Self-pay

## 2018-05-08 NOTE — Telephone Encounter (Signed)
Post procedure phone call.  Left message.  

## 2018-05-10 NOTE — Discharge Summary (Signed)
Physician Discharge Summary Note  Patient:  Jean Gomez is an 62 y.o., female MRN:  [id number] DOB:  12-01-1956 Patient phone:  [phone] (home)  Patient address:   [place] [address],  Total Time spent with patient: 45 minutes  Date of Admission:  04/17/2018 Date of Discharge: Apr 19, 2018  Reason for Admission: Admitted through the emergency room after being petitioned by family with reports of agitated bizarre potentially unsafe behavior.  Patient had labile inappropriate mood.  Principal Problem: Bipolar affective disorder, current episode manic [facility]) Discharge Diagnoses: Patient Active Problem List   Diagnosis Date Noted  . Bipolar affective disorder, current episode manic ([place]) [F31.9] 04/17/2018  . Chronic hip pain [facility] Area of Pain) (Bilateral) (R>L) [M25.551, M25.552, G89.29] 02/26/2018  . DDD (degenerative disc disease), cervical [M50.30] 02/26/2018  . Cervical facet arthropathy (Bilateral) [M38.466] 02/26/2018  . Cervical facet syndrome (Bilateral) (R>L) [Z99.357] 02/26/2018  . Cervical Grade 1 Anterolisthesis of C4 over C5 (Degenerative) [M43.10] 02/26/2018  . Cervical Grade 1 Retrolisthesis of C5 over C6 [M43.10] 02/26/2018  . Cervical foraminal stenosis (C3-4, C4-5, and C5-6) (Bilateral) [M99.81] 02/26/2018  . DDD (degenerative disc disease), lumbar [M51.36] 02/26/2018  . 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 Osteoarthritis of facet joint of lumbar spine [M47.816] 02/26/2018  . Spondylosis without myelopathy or radiculopathy, cervicothoracic region [M47.813] 02/26/2018  . Occipital headache (Right) [R51] 02/25/2018  . Frontal headache (Left) [R51] 02/25/2018  . Chronic low back pain (Midline) (Secondary Area of Pain) [M54.41, G89.29] 02/25/2018  . Chronic lumbar radiculopathy (Right) [M54.16] 02/25/2018  . Osteoarthritis involving multiple joints [M15.0] 02/25/2018  . Osteoarthritis of shoulder (Bilateral) [M19.011,  M19.012] 02/25/2018  . Chronic musculoskeletal pain [M79.18, G89.29] 02/25/2018  . Lumbar facet joint syndrome (Bilateral) (R>L) [M47.816] 02/25/2018  . Vitamin D deficiency [E55.9] 02/14/2018  . Chronic pain syndrome [G89.4] 02/07/2018  . Pharmacologic therapy [Z79.899] 02/07/2018  . Disorder of skeletal system [M89.9] 02/07/2018  . Problems influencing health status [Z78.9] 02/07/2018  . Long term current use of opiate analgesic [Z79.891] 02/07/2018  . Chronic low back pain (Bilateral) with right-sided sciatica (Secondary Area of Pain) (R>L) [M54.41, G89.29] 02/07/2018  . Chronic lower extremity pain [facility] Area of Pain) (Bilateral) (R>L) [id number], M79.605, G89.29] 02/07/2018  . Chronic knee pain (Fourth Area of Pain) (Bilateral) (R>L) [M25.561, M25.562, G89.29] 02/07/2018  . Chronic shoulder pain (Fifth Area of Pain) (Bilateral) (R>L) [M25.511, G89.29, M25.512] 02/07/2018  . Occipital neuralgia (Primary Area of Pain) (Bilateral) (R>L) [M54.81] 02/07/2018  . Chronic neck pain (Bilateral) (R>L) [M54.2, G89.29] 02/07/2018  . Chronic sacroiliac joint pain (Right) [M53.3, G89.29] 02/07/2018  . H/O aneurysm [Z86.79] 01/30/2018  . History of depression [Z86.59] 01/30/2018  . History of seizure disorder [Z86.69] 01/30/2018  . Chronic headache disorder (Primary Area of Pain) [R51] 01/30/2018  . Chronic migraine without aura [G43.709] 01/17/2018  . Chronic respiratory failure with hypoxia ([place]) [J96.11] 01/17/2018  . Constipation [K59.00] 01/17/2018  . HTN, goal below 140/90 [I10] 01/17/2018  . Hypercholesterolemia [E78.00] 01/17/2018  . Major depression in remission ([place]) [F32.5] 01/17/2018  . COPD (chronic obstructive pulmonary disease) ([place]) [J44.9] 01/17/2018  . Sarcoidosis [D86.9] 01/17/2018  . Seizure disorder ([place]) [S17.793] 01/17/2018  . Nonruptured cerebral aneurysm [I67.1] 10/22/2017  . Obstructive sleep apnea on CPAP [G47.33, Z99.89] 09/11/2017  . Generalized anxiety disorder  [F41.1] 01/26/2017  . Irritable bowel syndrome [K58.9] 01/26/2017  . Pulmonary emphysema ([place]) [J43.9] 01/26/2017  . GERD (gastroesophageal reflux disease) [K21.9] 08/02/2016  . Hiatal hernia [id number]]  08/02/2016  . Essential tremor [G25.0] 07/19/2016  . Muscle spasm [M62.838] 10/05/2015  . Spondylosis of lumbar region without myelopathy or radiculopathy [M47.816] 10/05/2015  . Rotator cuff injury [S46.009A] 09/17/2015  . Chronic shoulder pain (Right) [M25.511, G89.29] 09/17/2015  . Epilepsy ([place]) [D66.440] 07/14/2015  . Insomnia [G47.00] 07/14/2015  . Osteoarthritis of knee (Bilateral) (R>L) [M17.0] 06/24/2015    Past Psychiatric History: Uncertain.  Seems to have been on antidepressants in the past no clear psychotic symptoms.  Patient does not have much chart locally.  Past Medical History:  Past Medical History:  Diagnosis Date  . Asthma   . Cerebral aneurysm   . Chest pain 11/01/2016  . Chronic migraine   . Chronic respiratory failure ([place])   . COPD (chronic obstructive pulmonary disease) ([place])   . Hypertension   . Osteoarthritis   . Sarcoid   . Seizures ([place])     Past Surgical History:  Procedure Laterality Date  . ABDOMINAL HYSTERECTOMY    . CHOLECYSTECTOMY    . SPINAL FUSION     Family History: History reviewed. No pertinent family history. Family Psychiatric  History: Bipolar Social History:  Social History   Substance and Sexual Activity  Alcohol Use Yes   Comment: occasional     Social History   Substance and Sexual Activity  Drug Use No    Social History   Socioeconomic History  . Marital status: Single    Spouse name: Not on file  . Number of children: Not on file  . Years of education: Not on file  . Highest education level: Not on file  Occupational History  . Not on file  Social Needs  . Financial resource strain: Not very hard  . Food insecurity:    Worry: Sometimes true    Inability: Sometimes true  . Transportation needs:    Medical:  Yes    Non-medical: Yes  Tobacco Use  . Smoking status: Never Smoker  . Smokeless tobacco: Never Used  Substance and Sexual Activity  . Alcohol use: Yes    Comment: occasional  . Drug use: No  . Sexual activity: Not on file  Lifestyle  . Physical activity:    Days per week: Patient refused    Minutes per session: Patient refused  . Stress: Only a little  Relationships  . Social connections:    Talks on phone: Patient refused    Gets together: Patient refused    Attends religious service: Patient refused    Active member of club or organization: Patient refused    Attends meetings of clubs or organizations: Patient refused    Relationship status: Patient refused  Other Topics Concern  . Not on file  Social History Narrative  . Not on file    Hospital Course: Patient was admitted through the emergency room.  On the unit the patient engaged only superficially in groups and therapy.  Insisted that she did not have any acute problems other than her chronic medical issues.  Patient calm down and did not show any psychotic bizarre or dangerous behavior on the unit.  She was accepting of low dose of Seroquel and continuing her Effexor.  Patient was requesting discharge and did not appear to meet commitment criteria any longer.  She was discharged after it was ascertained that she did have a place that she could go to live safely.  Follow-up with her primary care doctor  Physical Findings: AIMS: Facial and Oral Movements Muscles of Facial Expression: None, normal  Lips and Perioral Area: None, normal Jaw: None, normal Tongue: None, normal,Extremity Movements Upper (arms, wrists, hands, fingers): None, normal Lower (legs, knees, ankles, toes): None, normal, Trunk Movements Neck, shoulders, hips: None, normal, Overall Severity Severity of abnormal movements (highest score from questions above): None, normal Incapacitation due to abnormal movements: None, normal Patient's awareness of  abnormal movements (rate only patient's report): No Awareness, Dental Status Current problems with teeth and/or dentures?: Yes(dentures ) Does patient usually wear dentures?: Yes  CIWA:    COWS:     Musculoskeletal: Strength & Muscle Tone: within normal limits Gait & Station: normal Patient leans: N/A  Psychiatric Specialty Exam: Physical Exam  Nursing note and vitals reviewed. Constitutional: She appears well-developed and well-nourished.  HENT:  Head: Normocephalic and atraumatic.  Eyes: Pupils are equal, round, and reactive to light. Conjunctivae are normal.  Neck: Normal range of motion.  Cardiovascular: Regular rhythm and normal heart sounds.  Respiratory: Effort normal. No respiratory distress.  GI: Soft.  Musculoskeletal: Normal range of motion.  Neurological: She is alert.  Skin: Skin is warm and dry.  Psychiatric: She has a normal mood and affect. Her speech is normal and behavior is normal. Judgment and thought content normal. Cognition and memory are normal.    Review of Systems  Constitutional: Negative.   HENT: Negative.   Eyes: Negative.   Respiratory: Negative.   Cardiovascular: Negative.   Gastrointestinal: Negative.   Musculoskeletal: Positive for joint pain.  Skin: Negative.   Neurological: Negative.   Psychiatric/Behavioral: Negative.     Blood pressure 131/86, pulse (!) 113, temperature 98.8 F (37.1 C), temperature source Oral, resp. rate 18, height '5\' 4"'$  (1.626 m), weight 74.4 kg (164 lb), SpO2 100 %.Body mass index is 28.15 kg/m.  General Appearance: Fairly Groomed  Eye Contact:  Good  Speech:  Clear and Coherent  Volume:  Normal  Mood:  Euthymic  Affect:  Congruent  Thought Process:  Goal Directed  Orientation:  Full (Time, Place, and Person)  Thought Content:  Logical  Suicidal Thoughts:  No  Homicidal Thoughts:  No  Memory:  Immediate;   Fair Recent;   Fair Remote;   Fair  Judgement:  Fair  Insight:  Fair  Psychomotor Activity:   Decreased  Concentration:  Concentration: Fair  Recall:  [place] of Knowledge:  Fair  Language:  Fair  Akathisia:  No  Handed:  Right  AIMS (if indicated):     Assets:  Communication Skills Resilience  ADL's:  Intact  Cognition:  WNL  Sleep:  Number of Hours: 4.3     Have you used any form of tobacco in the last 30 days? (Cigarettes, Smokeless Tobacco, Cigars, and/or Pipes): No  Has this patient used any form of tobacco in the last 30 days? (Cigarettes, Smokeless Tobacco, Cigars, and/or Pipes) Yes, No  Blood Alcohol level:  Lab Results  Component Value Date   ETH <10 75/64/3329    Metabolic Disorder Labs:  Lab Results  Component Value Date   HGBA1C 5.9 (H) 04/18/2018   MPG 122.63 04/18/2018   No results found for: PROLACTIN Lab Results  Component Value Date   CHOL 154 04/18/2018   TRIG 44 04/18/2018   HDL 64 04/18/2018   CHOLHDL 2.4 04/18/2018   VLDL 9 04/18/2018   LDLCALC 81 04/18/2018    See Psychiatric Specialty Exam and Suicide Risk Assessment completed by Attending Physician prior to discharge.  Discharge destination:  Home  Is patient on multiple antipsychotic therapies at  discharge:  No   Has Patient had three or more failed trials of antipsychotic monotherapy by history:  No  Recommended Plan for Multiple Antipsychotic Therapies: NA  Discharge Instructions    Diet - low sodium heart healthy   Complete by:  As directed    Increase activity slowly   Complete by:  As directed      Allergies as of 04/19/2018      Reactions   Gabapentin    Labetalol Other (See Comments)   Made hair fall out   Sulfabenzamide Nausea Only   Fentanyl Rash   Penicillins Rash      Medication List    STOP taking these medications   budesonide-formoterol 160-4.5 MCG/ACT inhaler Commonly known as:  SYMBICORT   ergocalciferol 50000 units capsule Commonly known as:  VITAMIN D2   lactulose 10 GM/15ML solution Commonly known as:  CHRONULAC   mirtazapine 15 MG  tablet Commonly known as:  REMERON   ondansetron 4 MG tablet Commonly known as:  ZOFRAN     TAKE these medications     Indication  Albuterol Sulfate 108 (90 Base) MCG/ACT Aepb Inhale 2 puffs into the lungs every 6 (six) hours as needed. What changed:  Another medication with the same name was removed. Continue taking this medication, and follow the directions you see here.  Indication:  Exercise-Induced Bronchospastic Disease   amLODipine 10 MG tablet Commonly known as:  NORVASC Take 1 tablet (10 mg total) by mouth daily.  Indication:  High Blood Pressure Disorder   aspirin EC 81 MG tablet Take 1 tablet (81 mg total) by mouth daily.  Indication:  Stable Angina Pectoris   atorvastatin 10 MG tablet Commonly known as:  LIPITOR Take 1 tablet (10 mg total) by mouth daily.  Indication:  High Amount of Triglycerides in the Blood   betamethasone (augmented) 0.05 % lotion Commonly known as:  DIPROLENE Apply topically 2 (two) times daily.  Indication:  Atopic Dermatitis   clotrimazole-betamethasone cream Commonly known as:  LOTRISONE Apply 1 application topically 2 (two) times daily.  Indication:  Ringworm of Groin Area   diclofenac sodium 1 % Gel Commonly known as:  VOLTAREN Apply 2 g topically 4 (four) times daily.  Indication:  Joint Damage causing Pain and Loss of Function   escitalopram 20 MG tablet Commonly known as:  LEXAPRO Take 1 tablet (20 mg total) by mouth daily.  Indication:  Major Depressive Disorder   hydrochlorothiazide 25 MG tablet Commonly known as:  HYDRODIURIL Take 1 tablet (25 mg total) by mouth daily.  Indication:  High Blood Pressure Disorder   ipratropium 0.02 % nebulizer solution Commonly known as:  ATROVENT Take 0.5 mg by nebulization every 4 (four) hours as needed for wheezing or shortness of breath.  Indication:  Chronic Obstructive Lung Disease   ketoconazole 2 % shampoo Commonly known as:  NIZORAL Apply 1 application topically 2 (two)  times a week.  Indication:  Tinea Versicolor   levETIRAcetam 750 MG tablet Commonly known as:  KEPPRA Take 1 tablet (750 mg total) by mouth daily.  Indication:  Seizure   losartan 25 MG tablet Commonly known as:  COZAAR Take 1 tablet (25 mg total) by mouth daily.  Indication:  High Blood Pressure Disorder   nitroGLYCERIN 0.4 MG SL tablet Commonly known as:  NITROSTAT Place 0.4 mg under the tongue every 5 (five) minutes as needed for chest pain.  Indication:  Acute Angina Pectoris   pantoprazole 40 MG tablet Commonly known as:  PROTONIX Take 1 tablet (40 mg total) by mouth daily.  Indication:  Gastroesophageal Reflux Disease   potassium chloride SA 20 MEQ tablet Commonly known as:  K-DUR,KLOR-CON Take 1 tablet (20 mEq total) by mouth 2 (two) times daily.  Indication:  Low Amount of Potassium in the Blood   QUEtiapine 100 MG tablet Commonly known as:  SEROQUEL Take 1 tablet (100 mg total) by mouth at bedtime.  Indication:  Manic Phase of Manic-Depression   sucralfate 1 g tablet Commonly known as:  CARAFATE Take 1 tablet (1 g total) by mouth 4 (four) times daily.  Indication:  Ulcer of the Duodenum   torsemide 5 MG tablet Commonly known as:  DEMADEX  Indication:  Edema   venlafaxine 75 MG tablet Commonly known as:  EFFEXOR Take 1 tablet (75 mg total) by mouth daily.  Indication:  Major Depressive Disorder      Follow-up Information    Pc, [employer]. Go on 04/22/2018.   Why:  Please go to your hospital follow up appointment with [facility] on Monday, 04/22/18 at [place]. Thank you! Contact information: [address] [place] [address] [phone]           Follow-up recommendations:  Activity:  Activity as tolerated Diet:  Heart healthy diet Other:  Follow-up medication management and taking care of her ADLs follow-up with primary care doctor  Comments: Patient seemed to calm down and did not show psychotic or dangerous  behaviors and was requesting discharge.  No longer met commitment criteria.  Signed: Alethia Berthold, MD 05/10/2018, 5:48 PM

## 2018-05-23 ENCOUNTER — Ambulatory Visit: Payer: Self-pay | Admitting: Pain Medicine

## 2018-05-30 ENCOUNTER — Ambulatory Visit: Payer: Medicare Other | Attending: Pain Medicine | Admitting: Pain Medicine

## 2018-05-30 ENCOUNTER — Other Ambulatory Visit: Payer: Self-pay

## 2018-05-30 ENCOUNTER — Encounter: Payer: Self-pay | Admitting: Pain Medicine

## 2018-05-30 VITALS — BP 158/103 | HR 110 | Temp 99.1°F | Resp 18 | Ht 64.0 in | Wt 169.0 lb

## 2018-05-30 DIAGNOSIS — Z888 Allergy status to other drugs, medicaments and biological substances status: Secondary | ICD-10-CM | POA: Diagnosis not present

## 2018-05-30 DIAGNOSIS — M5441 Lumbago with sciatica, right side: Secondary | ICD-10-CM | POA: Insufficient documentation

## 2018-05-30 DIAGNOSIS — M5416 Radiculopathy, lumbar region: Secondary | ICD-10-CM

## 2018-05-30 DIAGNOSIS — Z79891 Long term (current) use of opiate analgesic: Secondary | ICD-10-CM | POA: Insufficient documentation

## 2018-05-30 DIAGNOSIS — Z7982 Long term (current) use of aspirin: Secondary | ICD-10-CM | POA: Diagnosis not present

## 2018-05-30 DIAGNOSIS — M25562 Pain in left knee: Secondary | ICD-10-CM | POA: Insufficient documentation

## 2018-05-30 DIAGNOSIS — Z90710 Acquired absence of both cervix and uterus: Secondary | ICD-10-CM | POA: Diagnosis not present

## 2018-05-30 DIAGNOSIS — M79605 Pain in left leg: Secondary | ICD-10-CM | POA: Insufficient documentation

## 2018-05-30 DIAGNOSIS — Z79899 Other long term (current) drug therapy: Secondary | ICD-10-CM | POA: Insufficient documentation

## 2018-05-30 DIAGNOSIS — M25561 Pain in right knee: Secondary | ICD-10-CM | POA: Insufficient documentation

## 2018-05-30 DIAGNOSIS — Z885 Allergy status to narcotic agent status: Secondary | ICD-10-CM | POA: Insufficient documentation

## 2018-05-30 DIAGNOSIS — Z981 Arthrodesis status: Secondary | ICD-10-CM | POA: Diagnosis not present

## 2018-05-30 DIAGNOSIS — Z91041 Radiographic dye allergy status: Secondary | ICD-10-CM | POA: Insufficient documentation

## 2018-05-30 DIAGNOSIS — Z9889 Other specified postprocedural states: Secondary | ICD-10-CM | POA: Diagnosis not present

## 2018-05-30 DIAGNOSIS — M79604 Pain in right leg: Secondary | ICD-10-CM | POA: Insufficient documentation

## 2018-05-30 DIAGNOSIS — G8929 Other chronic pain: Secondary | ICD-10-CM

## 2018-05-30 DIAGNOSIS — Z880 Allergy status to penicillin: Secondary | ICD-10-CM | POA: Insufficient documentation

## 2018-05-30 DIAGNOSIS — M170 Bilateral primary osteoarthritis of knee: Secondary | ICD-10-CM | POA: Diagnosis present

## 2018-05-30 DIAGNOSIS — M549 Dorsalgia, unspecified: Secondary | ICD-10-CM | POA: Diagnosis not present

## 2018-05-30 DIAGNOSIS — M5136 Other intervertebral disc degeneration, lumbar region: Secondary | ICD-10-CM | POA: Diagnosis not present

## 2018-05-30 DIAGNOSIS — Z9049 Acquired absence of other specified parts of digestive tract: Secondary | ICD-10-CM | POA: Diagnosis not present

## 2018-05-30 DIAGNOSIS — M51369 Other intervertebral disc degeneration, lumbar region without mention of lumbar back pain or lower extremity pain: Secondary | ICD-10-CM

## 2018-05-30 MED ORDER — SODIUM HYALURONATE (VISCOSUP) 20 MG/2ML IX SOSY
2.0000 mL | PREFILLED_SYRINGE | Freq: Once | INTRA_ARTICULAR | Status: AC
  Administered 2018-05-30: 2 mL via INTRA_ARTICULAR

## 2018-05-30 MED ORDER — LIDOCAINE HCL (PF) 1 % IJ SOLN
4.0000 mL | Freq: Once | INTRAMUSCULAR | Status: AC
  Administered 2018-05-30: 5 mL
  Filled 2018-05-30: qty 5

## 2018-05-30 MED ORDER — ROPIVACAINE HCL 2 MG/ML IJ SOLN
4.0000 mL | Freq: Once | INTRAMUSCULAR | Status: AC
  Administered 2018-05-30: 10 mL via INTRA_ARTICULAR
  Filled 2018-05-30: qty 10

## 2018-05-30 NOTE — Patient Instructions (Signed)
____________________________________________________________________________________________  Post-Procedure Discharge Instructions  Instructions:  Apply ice: Fill a plastic sandwich bag with crushed ice. Cover it with a small towel and apply to injection site. Apply for 15 minutes then remove x 15 minutes. Repeat sequence on day of procedure, until you go to bed. The purpose is to minimize swelling and discomfort after procedure.  Apply heat: Apply heat to procedure site starting the day following the procedure. The purpose is to treat any soreness and discomfort from the procedure.  Food intake: Start with clear liquids (like water) and advance to regular food, as tolerated.   Physical activities: Keep activities to a minimum for the first 8 hours after the procedure.   Driving: If you have received any sedation, you are not allowed to drive for 24 hours after your procedure.  Blood thinner: Restart your blood thinner 6 hours after your procedure. (Only for those taking blood thinners)  Insulin: As soon as you can eat, you may resume your normal dosing schedule. (Only for those taking insulin)  Infection prevention: Keep procedure site clean and dry.  Post-procedure Pain Diary: Extremely important that this be done correctly and accurately. Recorded information will be used to determine the next step in treatment.  Pain evaluated is that of treated area only. Do not include pain from an untreated area.  Complete every hour, on the hour, for the initial 8 hours. Set an alarm to help you do this part accurately.  Do not go to sleep and have it completed later. It will not be accurate.  Follow-up appointment: Keep your follow-up appointment after the procedure. Usually 2 weeks for most procedures. (6 weeks in the case of radiofrequency.) Bring you pain diary.   Expect:  From numbing medicine (AKA: Local Anesthetics): Numbness or decrease in pain.  Onset: Full effect within 15  minutes of injected.  Duration: It will depend on the type of local anesthetic used. On the average, 1 to 8 hours.   From steroids: Decrease in swelling or inflammation. Once inflammation is improved, relief of the pain will follow.  Onset of benefits: Depends on the amount of swelling present. The more swelling, the longer it will take for the benefits to be seen. In some cases, up to 10 days.  Duration: Steroids will stay in the system x 2 weeks. Duration of benefits will depend on multiple posibilities including persistent irritating factors.  From procedure: Some discomfort is to be expected once the numbing medicine wears off. This should be minimal if ice and heat are applied as instructed.  Call if:  You experience numbness and weakness that gets worse with time, as opposed to wearing off.  New onset bowel or bladder incontinence. (This applies to Spinal procedures only)  Emergency Numbers:  Durning business hours (Monday - Thursday, 8:00 AM - 4:00 PM) (Friday, 9:00 AM - 12:00 Noon): [phone]  After hours: [phone] ____________________________________________________________________________________________   ____________________________________________________________________________________________  Preparing for your procedure (without sedation)  Instructions: . Oral Intake: Do not eat or drink anything for at least 3 hours prior to your procedure. . Transportation: Unless otherwise stated by your physician, you may drive yourself after the procedure. . Blood Pressure Medicine: Take your blood pressure medicine with a sip of water the morning of the procedure. . Blood thinners:  . Diabetics on insulin: Notify the staff so that you can be scheduled 1st case in the morning. If your diabetes requires high dose insulin, take only  of your normal insulin dose   the morning of the procedure and notify the staff that you have done so. . Preventing infections: Shower  with an antibacterial soap the morning of your procedure.  . Build-up your immune system: Take 1000 mg of Vitamin C with every meal (3 times a day) the day prior to your procedure. . Antibiotics: Inform the staff if you have a condition or reason that requires you to take antibiotics before dental procedures. . Pregnancy: If you are pregnant, call and cancel the procedure. . Sickness: If you have a cold, fever, or any active infections, call and cancel the procedure. . Arrival: You must be in the facility at least 30 minutes prior to your scheduled procedure. . Children: Do not bring any children with you. . Dress appropriately: Bring dark clothing that you would not mind if they get stained. . Valuables: Do not bring any jewelry or valuables.  Procedure appointments are reserved for interventional treatments only. . No Prescription Refills. . No medication changes will be discussed during procedure appointments. . No disability issues will be discussed.  Remember:  Regular Business hours are:  Monday to Thursday 8:00 AM to 4:00 PM  Provider's Schedule: Monty Mccarrell, MD:  Procedure days: Tuesday and Thursday 7:30 AM to 4:00 PM  Bilal Lateef, MD:  Procedure days: Monday and Wednesday 7:30 AM to 4:00 PM ____________________________________________________________________________________________    

## 2018-05-30 NOTE — Progress Notes (Signed)
Safety precautions to be maintained throughout the outpatient stay will include: orient to surroundings, keep bed in low position, maintain call bell within reach at all times, provide assistance with transfer out of bed and ambulation.  

## 2018-05-30 NOTE — Progress Notes (Addendum)
Patient's Name: Jean Gomez  MRN: [id number]  Referring Provider: Lauro RegulusAnderson, Marshall W, MD  DOB: [birth year]  PCP: Lauro RegulusAnderson, Marshall W, MD  DOS: 05/30/2018  Note by: Oswaldo DoneFrancisco A Blanche Gallien, MD  Service setting: Ambulatory outpatient  Specialty: Interventional Pain Management  Patient type: Established  Location: [facility] (AMB) Pain Management Facility  Visit type: Interventional Procedure   Primary Reason for Visit: Interventional Pain Management Treatment. CC: Knee Pain  Procedure:  Anesthesia, Analgesia, Anxiolysis:  Type: Therapeutic Intra-Articular Hyalgan Knee Injection #4  Region: Lateral infrapatellar Knee Region Level: Knee Joint Laterality: Bilateral  Type: Local Anesthesia Indication(s): Analgesia         Local Anesthetic: Lidocaine 1-2% Route: Infiltration ([place]/IM) IV Access: Declined Sedation: Declined    Indications: 1. Osteoarthritis of knee (Bilateral) (R>L)   2. Chronic knee pain (Fourth Area of Pain) (Bilateral) (R>L)   3. History of allergy to radiographic contrast media    Pain Score: Pre-procedure: 4 /10 Post-procedure: 4 /10  Pre-op Assessment:  Jean Gomez is a 62 y.o. (year old), female patient, seen today for interventional treatment. She  has a past surgical history that includes Cholecystectomy; Abdominal hysterectomy; and Spinal fusion. Jean Gomez has a current medication list which includes the following prescription(s): albuterol sulfate, amlodipine, aspirin ec, atorvastatin, betamethasone (augmented), clotrimazole-betamethasone, diclofenac sodium, escitalopram, hydrochlorothiazide, hydrocodone-acetaminophen, ipratropium, ketoconazole, levetiracetam, losartan, nitroglycerin, pantoprazole, potassium chloride sa, quetiapine, sucralfate, tizanidine, torsemide, venlafaxine, and hydrocodone-acetaminophen. Her primarily concern today is the Knee Pain  Today I noticed that the patient had an involuntary twitching of her toes on the right foot, except for the big toe.  She indicates that this is the leg where she has the pain that goes all the way down into her foot. In the past, we have done bilateral greater occipital nerve blocks and with today's injection, the patient would have had a series of 4 intra-articular Hyalgan injections, bilaterally. I will have the patient return in 2 weeks for her bilateral intra-articular Hyalgan knee injection #5, after which we will then address her back pain and right lower extremity pain.  Initial Vital Signs:  Pulse/HCG Rate: (!) 114  Temp: 99.1 F (37.3 C) Resp: 16 BP: (!) 162/102 SpO2: 100 %  BMI: Estimated body mass index is 29.01 kg/m as calculated from the following:   Height as of this encounter: 5\' 4"  (1.626 m).   Weight as of this encounter: 169 lb (76.7 kg).  Risk Assessment: Allergies: Reviewed. She is allergic to gabapentin; labetalol; sulfabenzamide; fentanyl; and penicillins.  Allergy Precautions: No iodine containing solutions or radiological contrast used. Coagulopathies: Reviewed. None identified.  Blood-thinner therapy: None at this time Active Infection(s): Reviewed. None identified. Jean Gomez is afebrile  Site Confirmation: Jean Gomez was asked to confirm the procedure and laterality before marking the site Procedure checklist: Completed Consent: Before the procedure and under the influence of no sedative(s), amnesic(s), or anxiolytics, the patient was informed of the treatment options, risks and possible complications. To fulfill our ethical and legal obligations, as recommended by the American Medical Association's Code of Ethics, I have informed the patient of my clinical impression; the nature and purpose of the treatment or procedure; the risks, benefits, and possible complications of the intervention; the alternatives, including doing nothing; the risk(s) and benefit(s) of the alternative treatment(s) or procedure(s); and the risk(s) and benefit(s) of doing nothing. The patient was provided  information about the general risks and possible complications associated with the procedure. These may include, but are not limited to: failure to  achieve desired goals, infection, bleeding, organ or nerve damage, allergic reactions, paralysis, and death. In addition, the patient was informed of those risks and complications associated to the procedure, such as failure to decrease pain; infection; bleeding; organ or nerve damage with subsequent damage to sensory, motor, and/or autonomic systems, resulting in permanent pain, numbness, and/or weakness of one or several areas of the body; allergic reactions; (i.e.: anaphylactic reaction); and/or death. Furthermore, the patient was informed of those risks and complications associated with the medications. These include, but are not limited to: allergic reactions (i.e.: anaphylactic or anaphylactoid reaction(s)); adrenal axis suppression; blood sugar elevation that in diabetics may result in ketoacidosis or comma; water retention that in patients with history of congestive heart failure may result in shortness of breath, pulmonary edema, and decompensation with resultant heart failure; weight gain; swelling or edema; medication-induced neural toxicity; particulate matter embolism and blood vessel occlusion with resultant organ, and/or nervous system infarction; and/or aseptic necrosis of one or more joints. Finally, the patient was informed that Medicine is not an exact science; therefore, there is also the possibility of unforeseen or unpredictable risks and/or possible complications that may result in a catastrophic outcome. The patient indicated having understood very clearly. We have given the patient no guarantees and we have made no promises. Enough time was given to the patient to ask questions, all of which were answered to the patient's satisfaction. Jean Gomez has indicated that she wanted to continue with the procedure. Attestation: I, the ordering  provider, attest that I have discussed with the patient the benefits, risks, side-effects, alternatives, likelihood of achieving goals, and potential problems during recovery for the procedure that I have provided informed consent. Date  Time: 05/30/2018 10:07 AM  Pre-Procedure Preparation:  Monitoring: As per clinic protocol. Respiration, ETCO2, SpO2, BP, heart rate and rhythm monitor placed and checked for adequate function Safety Precautions: Patient was assessed for positional comfort and pressure points before starting the procedure. Time-out: I initiated and conducted the "Time-out" before starting the procedure, as per protocol. The patient was asked to participate by confirming the accuracy of the "Time Out" information. Verification of the correct person, site, and procedure were performed and confirmed by me, the nursing staff, and the patient. "Time-out" conducted as per Joint Commission's Universal Protocol (UP.01.01.01). Time: 1018  Description of Procedure:       Position: Sitting Target Area: Knee Joint Approach: Just above the Lateral tibial plateau, lateral to the infrapatellar tendon. Area Prepped: Entire knee area, from the mid-thigh to the mid-shin. Prepping solution: ChloraPrep (2% chlorhexidine gluconate and 70% isopropyl alcohol) Safety Precautions: Aspiration looking for blood return was conducted prior to all injections. At no point did we inject any substances, as a needle was being advanced. No attempts were made at seeking any paresthesias. Safe injection practices and needle disposal techniques used. Medications properly checked for expiration dates. SDV (single dose vial) medications used. Description of the Procedure: Protocol guidelines were followed. The patient was placed in position over the fluoroscopy table. The target area was identified and the area prepped in the usual manner. Skin desensitized using vapocoolant spray. Skin & deeper tissues infiltrated with  local anesthetic. Appropriate amount of time allowed to pass for local anesthetics to take effect. The procedure needles were then advanced to the target area. Proper needle placement secured. Negative aspiration confirmed. Solution injected in intermittent fashion, asking for systemic symptoms every 0.5cc of injectate. The needles were then removed and the area cleansed, making sure to  leave some of the prepping solution back to take advantage of its long term bactericidal properties. Vitals:   05/30/18 1005 05/30/18 1007  BP: (!) 162/102 (!) 158/103  Pulse: (!) 114 (!) 110  Resp: 16 18  Temp: 99.1 F (37.3 C)   TempSrc: Oral   SpO2: 100% 100%  Weight: 169 lb (76.7 kg)   Height: 5\' 4"  (1.626 m)     Start Time: 1018 hrs. End Time: 1020 hrs. Materials:  Needle(s) Type: Regular needle Gauge: 22G Length: 3.5-in Medication(s): Please see orders for medications and dosing details.  Imaging Guidance:  Type of Imaging Technique: None used Indication(s): N/A Exposure Time: No patient exposure Contrast: None used. Fluoroscopic Guidance: N/A Ultrasound Guidance: N/A Interpretation: N/A  Antibiotic Prophylaxis:   Anti-infectives (From admission, onward)   None     Indication(s): None identified  Post-operative Assessment:  Post-procedure Vital Signs:  Pulse/HCG Rate: (!) 110  Temp: 99.1 F (37.3 C) Resp: 18 BP: (!) 158/103(took BP medication 15 minutes before arriving) SpO2: 100 %  EBL: None  Complications: No immediate post-treatment complications observed by team, or reported by patient.  Note: The patient tolerated the entire procedure well. A repeat set of vitals were taken after the procedure and the patient was kept under observation following institutional policy, for this type of procedure. Post-procedural neurological assessment was performed, showing return to baseline, prior to discharge. The patient was provided with post-procedure discharge instructions, including  a section on how to identify potential problems. Should any problems arise concerning this procedure, the patient was given instructions to immediately contact us, at any time, without hesitation. In any case, we plan to contact the patient by telephone for a follow-up status report regarding this interventional procedure.  Comments:  No additional relevant information.  Plan of Care   Possible POC:  I will have the patient return in 2 weeks for her bilateral intra-articular Hyalgan knee injection #5, after which we will then address her back pain and right lower extremity pain. Most likely will proceed with a right-sided L4-5 interlaminar lumbar epidural steroid injection #1 under fluoroscopic guidance. Because the patient's lumbar x-rays show degenerative disc disease but no clear pathology that would explain the patient's right sided radiculitis, we will consider doing an MRI to further investigate the patient's symptoms.    Imaging Orders  No imaging studies ordered today    Procedure Orders     KNEE INJECTION     KNEE INJECTION     Lumbar Epidural Injection  Medications ordered for procedure: Meds ordered this encounter  Medications  . lidocaine (PF) (XYLOCAINE) 1 % injection 4 mL  . ropivacaine (PF) 2 mg/mL (0.2%) (NAROPIN) injection 4 mL  . Sodium Hyaluronate SOSY 2 mL  . Sodium Hyaluronate SOSY 2 mL   Medications administered: We administered lidocaine (PF), ropivacaine (PF) 2 mg/mL (0.2%), Sodium Hyaluronate, and Sodium Hyaluronate.  See the medical record for exact dosing, route, and time of administration.  New Prescriptions   No medications on file   Disposition: Discharge home  Discharge Date & Time: 05/30/2018; 1023 hrs.   Physician-requested Follow-up: Return for PPE (2 wks) + Procedure (no sedation): (B) Hyalgan Knee in #5.  Future Appointments  Date Time Provider Department Center  06/18/2018  8:45 AM Barbette Merino, NP [facility] None  06/27/2018  9:00 AM  Delano Metz, MD [facility] None   Primary Care Physician: Lauro Regulus, MD Location: [facility] Note by: Oswaldo Done, MD Date:  05/30/2018; Time: 10:57 AM  Disclaimer:  Medicine is not an Visual merchandiser. The only guarantee in medicine is that nothing is guaranteed. It is important to note that the decision to proceed with this intervention was based on the information collected from the patient. The Data and conclusions were drawn from the patient's questionnaire, the interview, and the physical examination. Because the information was provided in large part by the patient, it cannot be guaranteed that it has not been purposely or unconsciously manipulated. Every effort has been made to obtain as much relevant data as possible for this evaluation. It is important to note that the conclusions that lead to this procedure are derived in large part from the available data. Always take into account that the treatment will also be dependent on availability of resources and existing treatment guidelines, considered by other Pain Management Practitioners as being common knowledge and practice, at the time of the intervention. For Medico-Legal purposes, it is also important to point out that variation in procedural techniques and pharmacological choices are the acceptable norm. The indications, contraindications, technique, and results of the above procedure should only be interpreted and judged by a Board-Certified Interventional Pain Specialist with extensive familiarity and expertise in the same exact procedure and technique.

## 2018-05-31 ENCOUNTER — Telehealth: Payer: Self-pay

## 2018-05-31 NOTE — Telephone Encounter (Signed)
Denies any needs at this time. Instructed to call if needed. 

## 2018-06-18 ENCOUNTER — Encounter: Payer: Self-pay | Admitting: Nurse Practitioner

## 2018-06-25 ENCOUNTER — Ambulatory Visit: Payer: Medicare Other | Attending: Nurse Practitioner | Admitting: Nurse Practitioner

## 2018-06-25 ENCOUNTER — Other Ambulatory Visit: Payer: Self-pay

## 2018-06-25 ENCOUNTER — Encounter: Payer: Self-pay | Admitting: Nurse Practitioner

## 2018-06-25 ENCOUNTER — Other Ambulatory Visit: Payer: Self-pay | Admitting: Nurse Practitioner

## 2018-06-25 VITALS — BP 150/87 | HR 92 | Temp 98.9°F | Ht 64.0 in | Wt 165.0 lb

## 2018-06-25 DIAGNOSIS — G894 Chronic pain syndrome: Secondary | ICD-10-CM

## 2018-06-25 DIAGNOSIS — Z5181 Encounter for therapeutic drug level monitoring: Secondary | ICD-10-CM | POA: Diagnosis not present

## 2018-06-25 DIAGNOSIS — M170 Bilateral primary osteoarthritis of knee: Secondary | ICD-10-CM | POA: Diagnosis not present

## 2018-06-25 DIAGNOSIS — M79604 Pain in right leg: Secondary | ICD-10-CM | POA: Insufficient documentation

## 2018-06-25 DIAGNOSIS — M7918 Myalgia, other site: Secondary | ICD-10-CM

## 2018-06-25 DIAGNOSIS — I671 Cerebral aneurysm, nonruptured: Secondary | ICD-10-CM | POA: Insufficient documentation

## 2018-06-25 DIAGNOSIS — Z885 Allergy status to narcotic agent status: Secondary | ICD-10-CM | POA: Insufficient documentation

## 2018-06-25 DIAGNOSIS — Z888 Allergy status to other drugs, medicaments and biological substances status: Secondary | ICD-10-CM | POA: Insufficient documentation

## 2018-06-25 DIAGNOSIS — M5481 Occipital neuralgia: Secondary | ICD-10-CM

## 2018-06-25 DIAGNOSIS — D869 Sarcoidosis, unspecified: Secondary | ICD-10-CM | POA: Diagnosis not present

## 2018-06-25 DIAGNOSIS — J449 Chronic obstructive pulmonary disease, unspecified: Secondary | ICD-10-CM | POA: Diagnosis not present

## 2018-06-25 DIAGNOSIS — G43709 Chronic migraine without aura, not intractable, without status migrainosus: Secondary | ICD-10-CM | POA: Insufficient documentation

## 2018-06-25 DIAGNOSIS — Z79891 Long term (current) use of opiate analgesic: Secondary | ICD-10-CM | POA: Insufficient documentation

## 2018-06-25 DIAGNOSIS — M25561 Pain in right knee: Secondary | ICD-10-CM

## 2018-06-25 DIAGNOSIS — Z79899 Other long term (current) drug therapy: Secondary | ICD-10-CM | POA: Insufficient documentation

## 2018-06-25 DIAGNOSIS — M25551 Pain in right hip: Secondary | ICD-10-CM | POA: Insufficient documentation

## 2018-06-25 DIAGNOSIS — M4802 Spinal stenosis, cervical region: Secondary | ICD-10-CM | POA: Insufficient documentation

## 2018-06-25 DIAGNOSIS — G8929 Other chronic pain: Secondary | ICD-10-CM

## 2018-06-25 DIAGNOSIS — E559 Vitamin D deficiency, unspecified: Secondary | ICD-10-CM | POA: Insufficient documentation

## 2018-06-25 DIAGNOSIS — G40909 Epilepsy, unspecified, not intractable, without status epilepticus: Secondary | ICD-10-CM | POA: Insufficient documentation

## 2018-06-25 DIAGNOSIS — F319 Bipolar disorder, unspecified: Secondary | ICD-10-CM | POA: Diagnosis not present

## 2018-06-25 DIAGNOSIS — M79605 Pain in left leg: Secondary | ICD-10-CM | POA: Diagnosis not present

## 2018-06-25 DIAGNOSIS — M5441 Lumbago with sciatica, right side: Secondary | ICD-10-CM | POA: Diagnosis not present

## 2018-06-25 DIAGNOSIS — M47896 Other spondylosis, lumbar region: Secondary | ICD-10-CM | POA: Diagnosis not present

## 2018-06-25 DIAGNOSIS — Z880 Allergy status to penicillin: Secondary | ICD-10-CM | POA: Insufficient documentation

## 2018-06-25 DIAGNOSIS — M19011 Primary osteoarthritis, right shoulder: Secondary | ICD-10-CM | POA: Diagnosis not present

## 2018-06-25 DIAGNOSIS — M47816 Spondylosis without myelopathy or radiculopathy, lumbar region: Secondary | ICD-10-CM | POA: Diagnosis not present

## 2018-06-25 DIAGNOSIS — M19012 Primary osteoarthritis, left shoulder: Secondary | ICD-10-CM | POA: Insufficient documentation

## 2018-06-25 DIAGNOSIS — Z7982 Long term (current) use of aspirin: Secondary | ICD-10-CM | POA: Insufficient documentation

## 2018-06-25 DIAGNOSIS — M5116 Intervertebral disc disorders with radiculopathy, lumbar region: Secondary | ICD-10-CM | POA: Diagnosis not present

## 2018-06-25 DIAGNOSIS — M533 Sacrococcygeal disorders, not elsewhere classified: Secondary | ICD-10-CM | POA: Insufficient documentation

## 2018-06-25 DIAGNOSIS — M25562 Pain in left knee: Secondary | ICD-10-CM

## 2018-06-25 DIAGNOSIS — M47893 Other spondylosis, cervicothoracic region: Secondary | ICD-10-CM | POA: Insufficient documentation

## 2018-06-25 DIAGNOSIS — K219 Gastro-esophageal reflux disease without esophagitis: Secondary | ICD-10-CM | POA: Insufficient documentation

## 2018-06-25 DIAGNOSIS — F411 Generalized anxiety disorder: Secondary | ICD-10-CM | POA: Insufficient documentation

## 2018-06-25 DIAGNOSIS — M47813 Spondylosis without myelopathy or radiculopathy, cervicothoracic region: Secondary | ICD-10-CM

## 2018-06-25 DIAGNOSIS — M25552 Pain in left hip: Secondary | ICD-10-CM | POA: Insufficient documentation

## 2018-06-25 DIAGNOSIS — K589 Irritable bowel syndrome without diarrhea: Secondary | ICD-10-CM | POA: Insufficient documentation

## 2018-06-25 DIAGNOSIS — E7800 Pure hypercholesterolemia, unspecified: Secondary | ICD-10-CM | POA: Insufficient documentation

## 2018-06-25 DIAGNOSIS — G4700 Insomnia, unspecified: Secondary | ICD-10-CM | POA: Insufficient documentation

## 2018-06-25 DIAGNOSIS — J9611 Chronic respiratory failure with hypoxia: Secondary | ICD-10-CM | POA: Insufficient documentation

## 2018-06-25 DIAGNOSIS — I10 Essential (primary) hypertension: Secondary | ICD-10-CM | POA: Insufficient documentation

## 2018-06-25 DIAGNOSIS — G4733 Obstructive sleep apnea (adult) (pediatric): Secondary | ICD-10-CM | POA: Insufficient documentation

## 2018-06-25 MED ORDER — TIZANIDINE HCL 4 MG PO TABS: 4.0000 mg | ORAL_TABLET | Freq: Three times a day (TID) | ORAL | 1 refills | Status: DC

## 2018-06-25 MED ORDER — DICLOFENAC SODIUM 1 % TD GEL: 2.0000 g | Freq: Four times a day (QID) | TRANSDERMAL | 1 refills | Status: DC

## 2018-06-25 MED ORDER — HYDROCODONE-ACETAMINOPHEN 10-325 MG PO TABS: 1.0000 | ORAL_TABLET | Freq: Three times a day (TID) | ORAL | 0 refills | Status: DC | PRN

## 2018-06-25 NOTE — Patient Instructions (Addendum)
____________________________________________________________________________________________  Medication Rules  Applies to: All patients receiving prescriptions (written or electronic).  Pharmacy of record: Pharmacy where electronic prescriptions will be sent. If written prescriptions are taken to a different pharmacy, please inform the nursing staff. The pharmacy listed in the electronic medical record should be the one where you would like electronic prescriptions to be sent.  Prescription refills: Only during scheduled appointments. Applies to both, written and electronic prescriptions.  NOTE: The following applies primarily to controlled substances (Opioid* Pain Medications).   Patient's responsibilities: 1. Pain Pills: Bring all pain pills to every appointment (except for procedure appointments). 2. Pill Bottles: Bring pills in original pharmacy bottle. Always bring newest bottle. Bring bottle, even if empty. 3. Medication refills: You are responsible for knowing and keeping track of what medications you need refilled. The day before your appointment, write a list of all prescriptions that need to be refilled. Bring that list to your appointment and give it to the admitting nurse. Prescriptions will be written only during appointments. If you forget a medication, it will not be "Called in", "Faxed", or "electronically sent". You will need to get another appointment to get these prescribed. 4. Prescription Accuracy: You are responsible for carefully inspecting your prescriptions before leaving our office. Have the discharge nurse carefully go over each prescription with you, before taking them home. Make sure that your name is accurately spelled, that your address is correct. Check the name and dose of your medication to make sure it is accurate. Check the number of pills, and the written instructions to make sure they are clear and accurate. Make sure that you are given enough medication to last  until your next medication refill appointment. 5. Taking Medication: Take medication as prescribed. Never take more pills than instructed. Never take medication more frequently than prescribed. Taking less pills or less frequently is permitted and encouraged, when it comes to controlled substances (written prescriptions).  6. Inform other Doctors: Always inform, all of your healthcare providers, of all the medications you take. 7. Pain Medication from other Providers: You are not allowed to accept any additional pain medication from any other Doctor or Healthcare provider. There are two exceptions to this rule. (see below) In the event that you require additional pain medication, you are responsible for notifying us, as stated below. 8. Medication Agreement: You are responsible for carefully reading and following our Medication Agreement. This must be signed before receiving any prescriptions from our practice. Safely store a copy of your signed Agreement. Violations to the Agreement will result in no further prescriptions. (Additional copies of our Medication Agreement are available upon request.) 9. Laws, Rules, & Regulations: All patients are expected to follow all Federal and State Laws, Statutes, Rules, & Regulations. Ignorance of the Laws does not constitute a valid excuse. The use of any illegal substances is prohibited. 10. Adopted CDC guidelines & recommendations: Target dosing levels will be at or below 60 MME/day. Use of benzodiazepines** is not recommended.  Exceptions: There are only two exceptions to the rule of not receiving pain medications from other Healthcare Providers. 1. Exception #1 (Emergencies): In the event of an emergency (i.e.: accident requiring emergency care), you are allowed to receive additional pain medication. However, you are responsible for: As soon as you are able, call our office [phone], at any time of the day or night, and leave a message stating your name, the  date and nature of the emergency, and the name and dose of the medication   prescribed. In the event that your call is answered by a member of our staff, make sure to document and save the date, time, and the name of the person that took your information.  2. Exception #2 (Planned Surgery): In the event that you are scheduled by another doctor or dentist to have any type of surgery or procedure, you are allowed (for a period no longer than 30 days), to receive additional pain medication, for the acute post-op pain. However, in this case, you are responsible for picking up a copy of our "Post-op Pain Management for Surgeons" handout, and giving it to your surgeon or dentist. This document is available at our office, and does not require an appointment to obtain it. Simply go to our office during business hours (Monday-Thursday from 8:00 AM to 4:00 PM) (Friday 8:00 AM to 12:00 Noon) or if you have a scheduled appointment with us, prior to your surgery, and ask for it by name. In addition, you will need to provide us with your name, name of your surgeon, type of surgery, and date of procedure or surgery.  *Opioid medications include: morphine, codeine, oxycodone, oxymorphone, hydrocodone, hydromorphone, meperidine, tramadol, tapentadol, buprenorphine, fentanyl, methadone. **Benzodiazepine medications include: diazepam (Valium), alprazolam (Xanax), clonazepam (Klonopine), lorazepam (Ativan), clorazepate (Tranxene), chlordiazepoxide (Librium), estazolam (Prosom), oxazepam (Serax), temazepam (Restoril), triazolam (Halcion) (Last updated: 02/14/2018) ____________________________________________________________________________________________   ____________________________________________________________________________________________  Preparing for Procedure with Sedation  Instructions: . Oral Intake: Do not eat or drink anything for at least 8 hours prior to your procedure. . Transportation: Public  transportation is not allowed. Bring an adult driver. The driver must be physically present in our waiting room before any procedure can be started. . Physical Assistance: Bring an adult physically capable of assisting you, in the event you need help. This adult should keep you company at home for at least 6 hours after the procedure. . Blood Pressure Medicine: Take your blood pressure medicine with a sip of water the morning of the procedure. . Blood thinners:  . Diabetics on insulin: Notify the staff so that you can be scheduled 1st case in the morning. If your diabetes requires high dose insulin, take only  of your normal insulin dose the morning of the procedure and notify the staff that you have done so. . Preventing infections: Shower with an antibacterial soap the morning of your procedure. . Build-up your immune system: Take 1000 mg of Vitamin C with every meal (3 times a day) the day prior to your procedure. . Antibiotics: Inform the staff if you have a condition or reason that requires you to take antibiotics before dental procedures. . Pregnancy: If you are pregnant, call and cancel the procedure. . Sickness: If you have a cold, fever, or any active infections, call and cancel the procedure. . Arrival: You must be in the facility at least 30 minutes prior to your scheduled procedure. . Children: Do not bring children with you. . Dress appropriately: Bring dark clothing that you would not mind if they get stained. . Valuables: Do not bring any jewelry or valuables.  Procedure appointments are reserved for interventional treatments only. . No Prescription Refills. . No medication changes will be discussed during procedure appointments. . No disability issues will be discussed.  Remember:  Regular Business hours are:  Monday to Thursday 8:00 AM to 4:00 PM  Provider's Schedule: Francisco Naveira, MD:  Procedure days: Tuesday and Thursday 7:30 AM to 4:00 PM  Bilal Lateef, MD:   Procedure days: Monday and   Wednesday 7:30 AM to 4:00 PM ____________________________________________________________________________________________ Jean QuinYou were given 2 prescriptions for Hydrocodone today. Scripts for Tizanidine and Diclofenac were sent to your pharmacy.

## 2018-06-25 NOTE — Progress Notes (Signed)
Patient's Name: Jean Gomez  MRN: [id number]  Referring Provider: Kirk Ruths, MD  DOB: 25-Nov-1956  PCP: Kirk Ruths, MD  DOS: 06/25/2018  Note by: Vevelyn Francois NP  Service setting: Ambulatory outpatient  Specialty: Interventional Pain Management  Location: [facility] (AMB) Pain Management Facility    Patient type: Established    Primary Reason(s) for Visit: Encounter for prescription drug management. (Level of risk: moderate)  CC: Back Pain (lower)  HPI  Jean Gomez is a 62 y.o. year old, female patient, who comes today for a medication management evaluation. She has Chronic pain syndrome; Pharmacologic therapy; Disorder of skeletal system; Problems influencing health status; Long term current use of opiate analgesic; Chronic low back pain (Bilateral) with right-sided sciatica (Secondary Area of Pain) (R>L); Chronic lower extremity pain (Tertiary Area of Pain) (Bilateral) (R>L); Chronic knee pain (Fourth Area of Pain) (Bilateral) (R>L); Chronic shoulder pain (Fifth Area of Pain) (Bilateral) (R>L); Occipital neuralgia (Primary Area of Pain) (Bilateral) (R>L); Chronic neck pain (Bilateral) (R>L); Chronic sacroiliac joint pain (Right); Chronic migraine without aura; Chronic respiratory failure with hypoxia ([place]); Constipation; Epilepsy ([place]); HTN, goal below 140/90; Essential tremor; Generalized anxiety disorder; H/O aneurysm; GERD (gastroesophageal reflux disease); History of depression; History of seizure disorder; Hypercholesterolemia; Insomnia; Irritable bowel syndrome; Major depression in remission ([place]); Muscle spasm; Obstructive sleep apnea on CPAP; Osteoarthritis of knee (Bilateral) (R>L); COPD (chronic obstructive pulmonary disease) ([place]); Rotator cuff injury; Sarcoidosis; Spondylosis of lumbar region without myelopathy or radiculopathy; Vitamin D deficiency; Chronic shoulder pain (Right); Nonruptured cerebral aneurysm; Chronic headache disorder (Primary Area of Pain); Pulmonary  emphysema ([place]); Seizure disorder ([place]); Hiatal hernia; Occipital headache (Right); Frontal headache (Left); Chronic low back pain (Midline) (Secondary Area of Pain); Chronic lumbar radiculopathy (Right); Osteoarthritis involving multiple joints; Osteoarthritis of shoulder (Bilateral); Chronic musculoskeletal pain; Lumbar facet joint syndrome (Bilateral) (R>L); Chronic hip pain (Tertiary Area of Pain) (Bilateral) (R>L); DDD (degenerative disc disease), cervical; Cervical facet arthropathy (Bilateral); Cervical facet syndrome (Bilateral) (R>L); Cervical Grade 1 Anterolisthesis of C4 over C5 (Degenerative); Cervical Grade 1 Retrolisthesis of C5 over C6; Cervical foraminal stenosis (C3-4, C4-5, and C5-6) (Bilateral); DDD (degenerative disc disease), lumbar; Osteoarthritis of lumbar spine; Osteoarthritis of facet joint of lumbar spine; Spondylosis without myelopathy or radiculopathy, cervicothoracic region; Bipolar affective disorder, current episode manic ([place]); History of allergy to radiographic contrast media; and Lumbar spondylosis on their problem list. Her primarily concern today is the Back Pain (lower)  Pain Assessment: Location: Right, Left, Lower Back Radiating: Radiates down to knee Onset: More than a month ago Duration: Chronic pain Quality: Throbbing Severity: 6 /10 (subjective, self-reported pain score)  Note: Reported level is compatible with observation.                          Effect on ADL: limits my activities Timing: Constant Modifying factors: lay down elevate feet BP: (!) 150/87  HR: 92  Jean Gomez was last scheduled for an appointment on 04/24/2018 for medication management. During today's appointment we reviewed Jean Gomez's chronic pain status, as well as her outpatient medication regimen. She admits that her pain has progressed secondary to her moving into her new apartment which has required more activity. She will continue with her 5th Hyalgan injection as scheduled. She feels  like the back pain is worsening. She has not had any interventional treatment for her back pain. She admits that the pain goes down into the knees. She denies any current numbness and tingling.  She continues to have her neck pain with headaches. She admits that she can hold off on future treatment for this until her back pain is treated.    The patient  reports that she does not use drugs. Her body mass index is 28.32 kg/m.  Further details on both, my assessment(s), as well as the proposed treatment plan, please see below.  Controlled Substance Pharmacotherapy Assessment REMS (Risk Evaluation and Mitigation Strategy)  Analgesic:Hydrocodone/acetaminophen 10/325 3 times daily (fill date 11/19/2017) hydrocodone 30 mg per day. MME/day:71m/day   BChauncey Fischer RN  06/25/2018 10:11 AM  Sign at close encounter Nursing Pain Medication Assessment:  Safety precautions to be maintained throughout the outpatient stay will include: orient to surroundings, keep bed in low position, maintain call bell within reach at all times, provide assistance with transfer out of bed and ambulation.  Medication Inspection Compliance: Ms. BKamadid not comply with our request to bring her pills to be counted. She was reminded that bringing the medication bottles, even when empty, is a requirement.  Medication: None brought in. Pill/Patch Count: None available to be counted. Bottle Appearance: No container available. Did not bring bottle(s) to appointment. Filled Date: N/A Last Medication intake:  Today   Pharmacokinetics: Liberation and absorption (onset of action): WNL Distribution (time to peak effect): WNL Metabolism and excretion (duration of action): WNL         Pharmacodynamics: Desired effects: Analgesia: Ms. BElizondoreports >50% benefit. Functional ability: Patient reports that medication allows her to accomplish basic ADLs Clinically meaningful improvement in function (CMIF): Sustained CMIF goals  met Perceived effectiveness: Described as relatively effective, allowing for increase in activities of daily living (ADL) Undesirable effects: Side-effects or Adverse reactions: None reported Monitoring: [place] PMP: Online review of the past 171-montheriod conducted. Compliant with practice rules and regulations Last UDS on record: Summary  Date Value Ref Range Status  03/27/2018 FINAL  Final    Comment:    ==================================================================== TOXASSURE SELECT 13 (MW) ==================================================================== Test                             Result       Flag       Units Drug Present and Declared for Prescription Verification   Hydrocodone                    646          EXPECTED   ng/mg creat   Hydromorphone                  367          EXPECTED   ng/mg creat   Dihydrocodeine                 317          EXPECTED   ng/mg creat   Norhydrocodone                 2063         EXPECTED   ng/mg creat    Sources of hydrocodone include scheduled prescription    medications. Hydromorphone, dihydrocodeine and norhydrocodone are    expected metabolites of hydrocodone. Hydromorphone and    dihydrocodeine are also available as scheduled prescription    medications. ==================================================================== Test                      Result  Flag   Units      Ref Range   Creatinine              24               mg/dL      >=20 ==================================================================== Declared Medications:  The flagging and interpretation on this report are based on the  following declared medications.  Unexpected results may arise from  inaccuracies in the declared medications.  **Note: The testing scope of this panel includes these medications:  Hydrocodone (Hydrocodone-Acetaminophen)  **Note: The testing scope of this panel does not include following  reported medications:  Acetaminophen  (Hydrocodone-Acetaminophen)  Albuterol  Amlodipine  Aspirin (Aspirin 81)  Atorvastatin  Budenoside (Symbicort)  Clotrimazole (Lotrisone)  Diclofenac  Escitalopram  Formoterol (Symbicort)  Hydrochlorothiazide (HCTZ)  Ipratropium  Ketoconazole  Lactulose  Levetiracetam  Losartan (Losartan Potassium)  Lubiprostone  Mirtazapine  Nitroglycerin  Ondansetron  Pantoprazole  Potassium  Sucralfate  Tizanidine  Topical  Torsemide  Venlafaxine  Vitamin D2 (Ergocalciferol) ==================================================================== For clinical consultation, please call [phone]. ====================================================================    UDS interpretation: Compliant          Medication Assessment Form: Reviewed. Patient indicates being compliant with therapy Treatment compliance: Compliant Risk Assessment Profile: Aberrant behavior: See prior evaluations. None observed or detected today Comorbid factors increasing risk of overdose: See prior notes. No additional risks detected today Risk of substance use disorder (SUD): Low  ORT Scoring interpretation table:  Score <3 = Low Risk for SUD  Score between 4-7 = Moderate Risk for SUD  Score >8 = High Risk for Opioid Abuse   Risk Mitigation Strategies:  Patient Counseling: Covered Patient-Prescriber Agreement (PPA): Present and active  Notification to other healthcare providers: Done  Pharmacologic Plan: No change in therapy, at this time.             Laboratory Chemistry  Inflammation Markers (CRP: Acute Phase) (ESR: Chronic Phase) Lab Results  Component Value Date   CRP <0.8 02/07/2018   ESRSEDRATE 53 (H) 02/07/2018                         Rheumatology Markers No results found for: RF, ANA, LABURIC, URICUR, LYMEIGGIGMAB, LYMEABIGMQN, HLAB27                      Renal Function Markers Lab Results  Component Value Date   BUN 10 04/18/2018   CREATININE 0.60 04/18/2018   GFRAA >60  04/18/2018   GFRNONAA >60 04/18/2018                             Hepatic Function Markers Lab Results  Component Value Date   AST 31 04/17/2018   ALT 22 04/17/2018   ALBUMIN 4.9 04/17/2018   ALKPHOS 65 04/17/2018                        Electrolytes Lab Results  Component Value Date   NA 136 04/18/2018   K 3.7 04/18/2018   CL 98 (L) 04/18/2018   CALCIUM 9.2 04/18/2018   MG 1.8 04/18/2018                        Neuropathy Markers Lab Results  Component Value Date   VITAMINB12 268 02/07/2018   HGBA1C 5.9 (H) 04/18/2018  Bone Pathology Markers Lab Results  Component Value Date   25OHVITD1 14 (L) 02/07/2018   25OHVITD2 <1.0 02/07/2018   25OHVITD3 14 02/07/2018                         Coagulation Parameters Lab Results  Component Value Date   PLT 423 04/17/2018                        Cardiovascular Markers Lab Results  Component Value Date   HGB 10.8 (L) 04/17/2018   HCT 33.1 (L) 04/17/2018                         CA Markers No results found for: CEA, CA125, LABCA2                      Note: Lab results reviewed.  Recent Diagnostic Imaging Results  DG C-Arm 1-60 Min-No Report Fluoroscopy was utilized by the requesting physician.  No radiographic  interpretation.   Complexity Note: Imaging results reviewed. Results shared with Ms. Owens Shark, using Layman's terms.                         Meds   Current Outpatient Medications:  .  Albuterol Sulfate 108 (90 Base) MCG/ACT AEPB, Inhale 2 puffs into the lungs every 6 (six) hours as needed., Disp: , Rfl:  .  amLODipine (NORVASC) 10 MG tablet, Take 1 tablet (10 mg total) by mouth daily., Disp: 30 tablet, Rfl: 0 .  aspirin EC 81 MG tablet, Take 1 tablet (81 mg total) by mouth daily., Disp: 30 tablet, Rfl: 0 .  atorvastatin (LIPITOR) 10 MG tablet, Take 1 tablet (10 mg total) by mouth daily., Disp: 30 tablet, Rfl: 0 .  betamethasone, augmented, (DIPROLENE) 0.05 % lotion, Apply topically 2 (two)  times daily., Disp: , Rfl:  .  clotrimazole-betamethasone (LOTRISONE) cream, Apply 1 application topically 2 (two) times daily., Disp: 30 g, Rfl: 0 .  diclofenac sodium (VOLTAREN) 1 % GEL, Apply 2 g topically 4 (four) times daily., Disp: 3 Tube, Rfl: 1 .  escitalopram (LEXAPRO) 20 MG tablet, Take 1 tablet (20 mg total) by mouth daily., Disp: 30 tablet, Rfl: 0 .  hydrochlorothiazide (HYDRODIURIL) 25 MG tablet, Take 1 tablet (25 mg total) by mouth daily., Disp: 30 tablet, Rfl: 0 .  [START ON 07/25/2018] HYDROcodone-acetaminophen (NORCO) 10-325 MG tablet, Take 1 tablet by mouth every 8 (eight) hours as needed for severe pain., Disp: 90 tablet, Rfl: 0 .  ipratropium (ATROVENT) 0.02 % nebulizer solution, Take 0.5 mg by nebulization every 4 (four) hours as needed for wheezing or shortness of breath., Disp: , Rfl:  .  ketoconazole (NIZORAL) 2 % shampoo, Apply 1 application topically 2 (two) times a week., Disp: , Rfl:  .  levETIRAcetam (KEPPRA) 750 MG tablet, Take 1 tablet (750 mg total) by mouth daily., Disp: 30 tablet, Rfl: 0 .  losartan (COZAAR) 25 MG tablet, Take 1 tablet (25 mg total) by mouth daily., Disp: 30 tablet, Rfl: 0 .  nitroGLYCERIN (NITROSTAT) 0.4 MG SL tablet, Place 0.4 mg under the tongue every 5 (five) minutes as needed for chest pain., Disp: , Rfl:  .  pantoprazole (PROTONIX) 40 MG tablet, Take 1 tablet (40 mg total) by mouth daily., Disp: 30 tablet, Rfl: 0 .  potassium chloride SA (K-DUR,KLOR-CON) 20 MEQ tablet, Take 1 tablet (  20 mEq total) by mouth 2 (two) times daily., Disp: 30 tablet, Rfl: 0 .  QUEtiapine (SEROQUEL) 100 MG tablet, Take 1 tablet (100 mg total) by mouth at bedtime., Disp: 30 tablet, Rfl: 0 .  sucralfate (CARAFATE) 1 g tablet, Take 1 tablet (1 g total) by mouth 4 (four) times daily., Disp: 120 tablet, Rfl: 0 .  torsemide (DEMADEX) 5 MG tablet, , Disp: , Rfl: 11 .  venlafaxine (EFFEXOR) 75 MG tablet, Take 1 tablet (75 mg total) by mouth daily., Disp: 30 tablet, Rfl: 0 .   HYDROcodone-acetaminophen (NORCO) 10-325 MG tablet, Take 1 tablet by mouth every 8 (eight) hours as needed for severe pain., Disp: 90 tablet, Rfl: 0 .  tiZANidine (ZANAFLEX) 4 MG tablet, Take 1 tablet (4 mg total) by mouth 3 (three) times daily., Disp: 90 tablet, Rfl: 1  ROS  Constitutional: Denies any fever or chills Gastrointestinal: No reported hemesis, hematochezia, vomiting, or acute GI distress Musculoskeletal: Denies any acute onset joint swelling, redness, loss of ROM, or weakness Neurological: No reported episodes of acute onset apraxia, aphasia, dysarthria, agnosia, amnesia, paralysis, loss of coordination, or loss of consciousness  Allergies  Ms. Pilger is allergic to gabapentin; labetalol; sulfabenzamide; fentanyl; and penicillins.  [place]  Drug: Ms. Kuehnel  reports that she does not use drugs. Alcohol:  reports that she drinks alcohol. Tobacco:  reports that she has never smoked. She has never used smokeless tobacco. Medical:  has a past medical history of Asthma, Cerebral aneurysm, Chest pain (11/01/2016), Chronic migraine, Chronic respiratory failure ([place]), COPD (chronic obstructive pulmonary disease) ([place]), Hypertension, Osteoarthritis, Sarcoid, and Seizures ([place]). Surgical: Ms. Sambrano  has a past surgical history that includes Cholecystectomy; Abdominal hysterectomy; and Spinal fusion. Family: family history is not on file.  Constitutional Exam  General appearance: Well nourished, well developed, and well hydrated. In no apparent acute distress Vitals:   06/25/18 0941  BP: (!) 150/87  Pulse: 92  Temp: 98.9 F (37.2 C)  SpO2: 100%  Weight: 165 lb (74.8 kg)  Height: 5' 4" (1.626 m)  Psych/Mental status: Alert, oriented x 3 (person, place, & time)       Eyes: PERLA Respiratory: No evidence of acute respiratory distress  Cervical Spine Area Exam  Skin & Axial Inspection: No masses, redness, edema, swelling, or associated skin lesions Alignment: Symmetrical Functional  ROM: Unrestricted ROM      Stability: No instability detected Muscle Tone/Strength: Functionally intact. No obvious neuro-muscular anomalies detected. Sensory (Neurological): Unimpaired Palpation: Complains of area being tender to palpation              Upper Extremity (UE) Exam    Side: Right upper extremity  Side: Left upper extremity  Skin & Extremity Inspection: Skin color, temperature, and hair growth are WNL. No peripheral edema or cyanosis. No masses, redness, swelling, asymmetry, or associated skin lesions. No contractures.  Skin & Extremity Inspection: Skin color, temperature, and hair growth are WNL. No peripheral edema or cyanosis. No masses, redness, swelling, asymmetry, or associated skin lesions. No contractures.  Functional ROM: Unrestricted ROM          Functional ROM: Unrestricted ROM          Muscle Tone/Strength: Functionally intact. No obvious neuro-muscular anomalies detected.  Muscle Tone/Strength: Functionally intact. No obvious neuro-muscular anomalies detected.  Sensory (Neurological): Unimpaired          Sensory (Neurological): Unimpaired          Palpation: No palpable anomalies  Palpation: No palpable anomalies              Provocative Test(s):  Phalen's test: deferred Tinel's test: deferred Apley's scratch test (touch opposite shoulder):  Action 1 (Across chest): deferred Action 2 (Overhead): deferred Action 3 (LB reach): deferred   Provocative Test(s):  Phalen's test: deferred Tinel's test: deferred Apley's scratch test (touch opposite shoulder):  Action 1 (Across chest): deferred Action 2 (Overhead): deferred Action 3 (LB reach): deferred    Thoracic Spine Area Exam  Skin & Axial Inspection: No masses, redness, or swelling Alignment: Symmetrical Functional ROM: Unrestricted ROM Stability: No instability detected Muscle Tone/Strength: Functionally intact. No obvious neuro-muscular anomalies detected. Sensory (Neurological):  Unimpaired Muscle strength & Tone: No palpable anomalies  Lumbar Spine Area Exam  Skin & Axial Inspection: No masses, redness, or swelling Alignment: Symmetrical Functional ROM: Unrestricted ROM       Stability: No instability detected Muscle Tone/Strength: Functionally intact. No obvious neuro-muscular anomalies detected. Sensory (Neurological): Unimpaired Palpation: Complains of area being tender to palpation       Provocative Tests: Lumbar Hyperextension/rotation test: (+) bilaterally for facet joint pain. Lumbar quadrant test (Kemp's test): deferred today       Lumbar Lateral bending test: deferred today       Patrick's Maneuver: deferred today                   FABER test: deferred today       Thigh-thrust test: deferred today       S-I compression test: deferred today       S-I distraction test: deferred today        Gait & Posture Assessment  Ambulation: Patient ambulates using a cane Gait: Antalgic Posture: WNL   Lower Extremity Exam    Side: Right lower extremity  Side: Left lower extremity  Stability: No instability observed          Stability: No instability observed          Skin & Extremity Inspection:Brace worn  Skin & Extremity Inspection: brace worn  Functional ROM: Unrestricted ROM                  Functional ROM: Unrestricted ROM                  Muscle Tone/Strength: Functionally intact. No obvious neuro-muscular anomalies detected.  Muscle Tone/Strength: Functionally intact. No obvious neuro-muscular anomalies detected.  Sensory (Neurological): Unimpaired  Sensory (Neurological): Unimpaired  Palpation: No palpable anomalies  Palpation: No palpable anomalies   Assessment  Primary Diagnosis & Pertinent Problem List: The primary encounter diagnosis was Occipital neuralgia (Primary Area of Pain) (Bilateral) (R>L). Diagnoses of Chronic knee pain (Fourth Area of Pain) (Bilateral) (R>L), Lumbar spondylosis, Spondylosis without myelopathy or radiculopathy,  cervicothoracic region, Chronic musculoskeletal pain, Chronic low back pain (Midline) (Secondary Area of Pain), and Chronic pain syndrome were also pertinent to this visit.  Status Diagnosis  Persistent Persistent Worsening 1. Occipital neuralgia (Primary Area of Pain) (Bilateral) (R>L)   2. Chronic knee pain (Fourth Area of Pain) (Bilateral) (R>L)   3. Lumbar spondylosis   4. Spondylosis without myelopathy or radiculopathy, cervicothoracic region   5. Chronic musculoskeletal pain   6. Chronic low back pain (Midline) (Secondary Area of Pain)   7. Chronic pain syndrome     Problems updated and reviewed during this visit: Problem  Lumbar Spondylosis   Plan of Care  Pharmacotherapy (Medications Ordered): Meds ordered this encounter  Medications  .  HYDROcodone-acetaminophen (NORCO) 10-325 MG tablet    Sig: Take 1 tablet by mouth every 8 (eight) hours as needed for severe pain.    Dispense:  90 tablet    Refill:  0    Do not place this medication, or any other prescription from our practice, on "Automatic Refill"Fill one day early if pharmacy is closed on scheduled refill date. Do not fill until: 07/25/2018 To last until: 08/24/2018    Order Specific Question:   Supervising Provider    Answer:   Milinda Pointer [phone]  . HYDROcodone-acetaminophen (NORCO) 10-325 MG tablet    Sig: Take 1 tablet by mouth every 8 (eight) hours as needed for severe pain.    Dispense:  90 tablet    Refill:  0    Do not place this medication, or any other prescription from our practice, on "Automatic Refill". Patient may have prescription filled one day early if pharmacy is closed on scheduled refill date. Do not fill until: 06/25/2018 To last until:07/25/2018    Order Specific Question:   Supervising Provider    Answer:   Milinda Pointer [phone]  . tiZANidine (ZANAFLEX) 4 MG tablet    Sig: Take 1 tablet (4 mg total) by mouth 3 (three) times daily.    Dispense:  90 tablet    Refill:  1    Do not place  medication on "Automatic Refill". Fill one day early if pharmacy is closed on scheduled refill date.    Order Specific Question:   Supervising Provider    Answer:   Milinda Pointer [phone]  . diclofenac sodium (VOLTAREN) 1 % GEL    Sig: Apply 2 g topically 4 (four) times daily.    Dispense:  3 Tube    Refill:  1    Order Specific Question:   Supervising Provider    Answer:   Milinda Pointer [phone]   New Prescriptions   No medications on file   Medications administered today: Leasha F. Glaeser had no medications administered during this visit. Lab-work, procedure(s), and/or referral(s): Orders Placed This Encounter  Procedures  . LUMBAR FACET(MEDIAL BRANCH NERVE BLOCK) MBNB   Imaging and/or referral(s): None  Interventional management options: Planned, scheduled, and/or pending: bilateral intra-articular Hyalgan knee injections #5 Diagnostic bilateral lumbar facet block   Considering: Diagnostic bilateral greater occipital nerve block Possible bilateral occipital nerve RFA Possible bilateral occipital nerve peripheral nerves stimulator trial Diagnostic bilateral cervical facet block Possible bilateral cervical facet RFA Diagnostic right-sided cervical epidural steroid injection Diagnostic bilateral intra-articular shoulder joint injection Diagnostic bilateral suprascapular nerve block Possible bilateral suprascapular nerve RFA Diagnostic bilateral lumbar facet block Possible bilateral lumbar facet RFA Diagnostic right-sided lumbar epidural steroid injection Diagnostic bilateral transforaminal epidural steroid injection Diagnostic bilateral intra-articular hip joint injection Diagnostic bilateral femoral nerve + obturator nerve block Possible bilateral femoral nerve + obturator nerve RFA Diagnostic bilateral intra-articular knee joint injection with local anesthetic and steroid Possible series of 5 bilateral intra-articular Hyalgan knee  injections Diagnostic bilateral genicular nerve block Possible bilateral genicular nerve RFA   PRN Procedures: None at this time     Provider-requested follow-up: Return in about 2 months (around 08/26/2018) for MedMgmt with Me Dionisio David), in addition, w/ Dr. Dossie Arbour, Procedure(w/Sedation).  Future Appointments  Date Time Provider [place]  07/04/2018  9:15 AM Milinda Pointer, MD [facility] None  08/26/2018  8:30 AM Vevelyn Francois, NP [facility] None   Primary Care Physician: Kirk Ruths, MD Location: [facility] Outpatient Pain Management Facility Note by: Donella Stade  Dorrene German NP Date: 06/25/2018; Time: 11:55 AM  Pain Score Disclaimer: We use the NRS-11 scale. This is a self-reported, subjective measurement of pain severity with only modest accuracy. It is used primarily to identify changes within a particular patient. It must be understood that outpatient pain scales are significantly less accurate that those used for research, where they can be applied under ideal controlled circumstances with minimal exposure to variables. In reality, the score is likely to be a combination of pain intensity and pain affect, where pain affect describes the degree of emotional arousal or changes in action readiness caused by the sensory experience of pain. Factors such as social and work situation, setting, emotional state, anxiety levels, expectation, and prior pain experience may influence pain perception and show large inter-individual differences that may also be affected by time variables.  Patient instructions provided during this appointment: Patient Instructions  ____________________________________________________________________________________________  Medication Rules  Applies to: All patients receiving prescriptions (written or electronic).  Pharmacy of record: Pharmacy where electronic prescriptions will be sent. If written prescriptions are taken to a different pharmacy,  please inform the nursing staff. The pharmacy listed in the electronic medical record should be the one where you would like electronic prescriptions to be sent.  Prescription refills: Only during scheduled appointments. Applies to both, written and electronic prescriptions.  NOTE: The following applies primarily to controlled substances (Opioid* Pain Medications).   Patient's responsibilities: 1. Pain Pills: Bring all pain pills to every appointment (except for procedure appointments). 2. Pill Bottles: Bring pills in original pharmacy bottle. Always bring newest bottle. Bring bottle, even if empty. 3. Medication refills: You are responsible for knowing and keeping track of what medications you need refilled. The day before your appointment, write a list of all prescriptions that need to be refilled. Bring that list to your appointment and give it to the admitting nurse. Prescriptions will be written only during appointments. If you forget a medication, it will not be "Called in", "Faxed", or "electronically sent". You will need to get another appointment to get these prescribed. 4. Prescription Accuracy: You are responsible for carefully inspecting your prescriptions before leaving our office. Have the discharge nurse carefully go over each prescription with you, before taking them home. Make sure that your name is accurately spelled, that your address is correct. Check the name and dose of your medication to make sure it is accurate. Check the number of pills, and the written instructions to make sure they are clear and accurate. Make sure that you are given enough medication to last until your next medication refill appointment. 5. Taking Medication: Take medication as prescribed. Never take more pills than instructed. Never take medication more frequently than prescribed. Taking less pills or less frequently is permitted and encouraged, when it comes to controlled substances (written prescriptions).   6. Inform other Doctors: Always inform, all of your healthcare providers, of all the medications you take. 7. Pain Medication from other Providers: You are not allowed to accept any additional pain medication from any other Doctor or Healthcare provider. There are two exceptions to this rule. (see below) In the event that you require additional pain medication, you are responsible for notifying us, as stated below. 8. Medication Agreement: You are responsible for carefully reading and following our Medication Agreement. This must be signed before receiving any prescriptions from our practice. Safely store a copy of your signed Agreement. Violations to the Agreement will result in no further prescriptions. (Additional copies of our Medication  Agreement are available upon request.) 9. Laws, Rules, & Regulations: All patients are expected to follow all Federal and [employer], [employer], Rules, [employer]. Ignorance of the Laws does not constitute a valid excuse. The use of any illegal substances is prohibited. 10. Adopted CDC guidelines & recommendations: Target dosing levels will be at or below 60 MME/day. Use of benzodiazepines** is not recommended.  Exceptions: There are only two exceptions to the rule of not receiving pain medications from other Healthcare Providers. 1. Exception #1 (Emergencies): In the event of an emergency (i.e.: accident requiring emergency care), you are allowed to receive additional pain medication. However, you are responsible for: As soon as you are able, call our office [phone], at any time of the day or night, and leave a message stating your name, the date and nature of the emergency, and the name and dose of the medication prescribed. In the event that your call is answered by a member of our staff, make sure to document and save the date, time, and the name of the person that took your information.  2. Exception #2 (Planned Surgery): In the event that you are  scheduled by another doctor or dentist to have any type of surgery or procedure, you are allowed (for a period no longer than 30 days), to receive additional pain medication, for the acute post-op pain. However, in this case, you are responsible for picking up a copy of our "Post-op Pain Management for Surgeons" handout, and giving it to your surgeon or dentist. This document is available at our office, and does not require an appointment to obtain it. Simply go to our office during business hours (Monday-Thursday from 8:00 AM to 4:00 PM) (Friday 8:00 AM to 12:00 Noon) or if you have a scheduled appointment with [place], prior to your surgery, and ask for it by name. In addition, you will need to provide [place] with your name, name of your surgeon, type of surgery, and date of procedure or surgery.  *Opioid medications include: morphine, codeine, oxycodone, oxymorphone, hydrocodone, hydromorphone, meperidine, tramadol, tapentadol, buprenorphine, fentanyl, methadone. **Benzodiazepine medications include: diazepam (Valium), alprazolam (Xanax), clonazepam (Klonopine), lorazepam (Ativan), clorazepate (Tranxene), chlordiazepoxide (Librium), estazolam (Prosom), oxazepam (Serax), temazepam (Restoril), triazolam (Halcion) (Last updated: 02/14/2018) ____________________________________________________________________________________________   ____________________________________________________________________________________________  Preparing for Procedure with Sedation  Instructions: . Oral Intake: Do not eat or drink anything for at least 8 hours prior to your procedure. . Transportation: Public transportation is not allowed. Bring an adult driver. The driver must be physically present in our waiting room before any procedure can be started. Marland Kitchen Physical Assistance: Bring an adult physically capable of assisting you, in the event you need help. This adult should keep you company at home for at least 6 hours after the  procedure. . Blood Pressure Medicine: Take your blood pressure medicine with a sip of water the morning of the procedure. . Blood thinners:  . Diabetics on insulin: Notify the staff so that you can be scheduled 1st case in the morning. If your diabetes requires high dose insulin, take only  of your normal insulin dose the morning of the procedure and notify the staff that you have done so. . Preventing infections: Shower with an antibacterial soap the morning of your procedure. . Build-up your immune system: Take 1000 mg of Vitamin C with every meal (3 times a day) the day prior to your procedure. Marland Kitchen Antibiotics: Inform the staff if you have a condition or reason that requires you to take  antibiotics before dental procedures. . Pregnancy: If you are pregnant, call and cancel the procedure. . Sickness: If you have a cold, fever, or any active infections, call and cancel the procedure. . Arrival: You must be in the facility at least 30 minutes prior to your scheduled procedure. . Children: Do not bring children with you. . Dress appropriately: Bring dark clothing that you would not mind if they get stained. . Valuables: Do not bring any jewelry or valuables.  Procedure appointments are reserved for interventional treatments only. Marland Kitchen No Prescription Refills. . No medication changes will be discussed during procedure appointments. . No disability issues will be discussed.  Remember:  Regular Business hours are:  Monday to Thursday 8:00 AM to 4:00 PM  Provider's Schedule: Milinda Pointer, MD:  Procedure days: Tuesday and Thursday 7:30 AM to 4:00 PM  Gillis Santa, MD:  Procedure days: Monday and Wednesday 7:30 AM to 4:00 PM ____________________________________________________________________________________________ Dennis Bast were given 2 prescriptions for Hydrocodone today. Scripts for Tizanidine and Diclofenac were sent to your pharmacy.

## 2018-06-25 NOTE — Progress Notes (Signed)
Nursing Pain Medication Assessment:  Safety precautions to be maintained throughout the outpatient stay will include: orient to surroundings, keep bed in low position, maintain call bell within reach at all times, provide assistance with transfer out of bed and ambulation.  Medication Inspection Compliance: [name] did not comply with our request to bring her pills to be counted. She was reminded that bringing the medication bottles, even when empty, is a requirement.  Medication: None brought in. Pill/Patch Count: None available to be counted. Bottle Appearance: No container available. Did not bring bottle(s) to appointment. Filled Date: N/A Last Medication intake:  Today

## 2018-06-27 ENCOUNTER — Ambulatory Visit: Payer: Self-pay | Admitting: Pain Medicine

## 2018-07-04 ENCOUNTER — Other Ambulatory Visit: Payer: Self-pay

## 2018-07-04 ENCOUNTER — Ambulatory Visit
Admission: RE | Admit: 2018-07-04 | Discharge: 2018-07-04 | Disposition: A | Discharge status: Home / Self Care | Payer: Medicare Other | Source: Ambulatory Visit | Attending: Pain Medicine | Admitting: Pain Medicine

## 2018-07-04 ENCOUNTER — Encounter: Payer: Self-pay | Admitting: Pain Medicine

## 2018-07-04 ENCOUNTER — Ambulatory Visit (HOSPITAL_BASED_OUTPATIENT_CLINIC_OR_DEPARTMENT_OTHER): Payer: Medicare Other | Admitting: Pain Medicine

## 2018-07-04 VITALS — BP 152/93 | HR 95 | Temp 98.1°F | Resp 23 | Ht 64.0 in | Wt 170.0 lb

## 2018-07-04 DIAGNOSIS — M25562 Pain in left knee: Secondary | ICD-10-CM

## 2018-07-04 DIAGNOSIS — Z885 Allergy status to narcotic agent status: Secondary | ICD-10-CM | POA: Insufficient documentation

## 2018-07-04 DIAGNOSIS — M47816 Spondylosis without myelopathy or radiculopathy, lumbar region: Secondary | ICD-10-CM | POA: Diagnosis present

## 2018-07-04 DIAGNOSIS — M545 Low back pain: Secondary | ICD-10-CM | POA: Diagnosis present

## 2018-07-04 DIAGNOSIS — Z888 Allergy status to other drugs, medicaments and biological substances status: Secondary | ICD-10-CM | POA: Diagnosis not present

## 2018-07-04 DIAGNOSIS — M170 Bilateral primary osteoarthritis of knee: Secondary | ICD-10-CM | POA: Diagnosis not present

## 2018-07-04 DIAGNOSIS — Z880 Allergy status to penicillin: Secondary | ICD-10-CM | POA: Insufficient documentation

## 2018-07-04 DIAGNOSIS — G8929 Other chronic pain: Secondary | ICD-10-CM

## 2018-07-04 DIAGNOSIS — M47813 Spondylosis without myelopathy or radiculopathy, cervicothoracic region: Secondary | ICD-10-CM

## 2018-07-04 DIAGNOSIS — M5441 Lumbago with sciatica, right side: Secondary | ICD-10-CM

## 2018-07-04 DIAGNOSIS — M25561 Pain in right knee: Secondary | ICD-10-CM | POA: Diagnosis not present

## 2018-07-04 MED ORDER — ROPIVACAINE HCL 2 MG/ML IJ SOLN
18.0000 mL | Freq: Once | INTRAMUSCULAR | Status: AC
  Administered 2018-07-04: 18 mL via PERINEURAL
  Filled 2018-07-04: qty 20

## 2018-07-04 MED ORDER — LIDOCAINE HCL (PF) 1 % IJ SOLN
4.0000 mL | Freq: Once | INTRAMUSCULAR | Status: AC
  Administered 2018-07-04: 4 mL
  Filled 2018-07-04: qty 5

## 2018-07-04 MED ORDER — LACTATED RINGERS IV SOLN
1000.0000 mL | Freq: Once | INTRAVENOUS | Status: AC
  Administered 2018-07-04: 1000 mL via INTRAVENOUS

## 2018-07-04 MED ORDER — SODIUM HYALURONATE (VISCOSUP) 20 MG/2ML IX SOSY
2.0000 mL | PREFILLED_SYRINGE | Freq: Once | INTRA_ARTICULAR | Status: AC
  Administered 2018-07-04: 11:00:00 via INTRA_ARTICULAR

## 2018-07-04 MED ORDER — ROPIVACAINE HCL 2 MG/ML IJ SOLN
4.0000 mL | Freq: Once | INTRAMUSCULAR | Status: AC
  Administered 2018-07-04: 10 mL via INTRA_ARTICULAR
  Filled 2018-07-04: qty 10

## 2018-07-04 MED ORDER — TRIAMCINOLONE ACETONIDE 40 MG/ML IJ SUSP
INTRAMUSCULAR | Status: AC
  Filled 2018-07-04: qty 2

## 2018-07-04 MED ORDER — MIDAZOLAM HCL 5 MG/5ML IJ SOLN
1.0000 mg | INTRAMUSCULAR | Status: DC | PRN
  Administered 2018-07-04: 3 mg via INTRAVENOUS
  Filled 2018-07-04: qty 5

## 2018-07-04 MED ORDER — LIDOCAINE HCL 2 % IJ SOLN
20.0000 mL | Freq: Once | INTRAMUSCULAR | Status: AC
  Administered 2018-07-04: 400 mg
  Filled 2018-07-04: qty 40

## 2018-07-04 MED ORDER — TRIAMCINOLONE ACETONIDE 40 MG/ML IJ SUSP
80.0000 mg | Freq: Once | INTRAMUSCULAR | Status: AC
  Administered 2018-07-04: 80 mg

## 2018-07-04 NOTE — Patient Instructions (Signed)

## 2018-07-04 NOTE — Progress Notes (Signed)
Patient's Name: Jean Gomez  MRN: [id number]  Referring Provider: Barbette Merino, NP  DOB: [birth year]  PCP: Lauro Regulus, MD  DOS: 07/04/2018  Note by: Oswaldo Done, MD  Service setting: Ambulatory outpatient  Specialty: Interventional Pain Management  Patient type: Established  Location: [facility] (AMB) Pain Management Facility  Visit type: Interventional Procedure   Primary Reason for Visit: Interventional Pain Management Treatment. CC: Back Pain (low) and Knee Pain (bilateral)  Procedure #1:  Anesthesia, Analgesia, Anxiolysis:  Type: Therapeutic Intra-Articular Hyalgan Knee Injection #5  Region: Lateral infrapatellar Knee Region Level: Knee Joint Laterality: Bilateral  Type: Local Anesthesia Indication(s): Analgesia         Local Anesthetic: Lidocaine 1-2% Route: Infiltration ([address]/IM) IV Access: Declined Sedation: Declined    Indications: 1. Osteoarthritis of knee (Bilateral) (R>L)   2. Chronic knee pain (Fourth Area of Pain) (Bilateral) (R>L)    Procedure #2:  Anesthesia, Analgesia, Anxiolysis:  Type: Lumbar Facet, Medial Branch Block(s) #1  Primary Purpose: Diagnostic Region: Posterolateral Lumbosacral Spine Level: L2, L3, L4, L5, & S1 Medial Branch Level(s). Injecting these levels blocks the L3-4, L4-5, and L5-S1 lumbar facet joints. Laterality: Bilateral  Type: Moderate (Conscious) Sedation combined with Local Anesthesia Indication(s): Analgesia and Anxiety Route: Intravenous (IV) IV Access: Secured Sedation: Meaningful verbal contact was maintained at all times during the procedure  Local Anesthetic: Lidocaine 1-2%   Indications: 1. Spondylosis of lumbar region without myelopathy or radiculopathy   2. 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 Osteoarthritis of facet joint of lumbar spine   4. Chronic low back pain (Midline) (Secondary Area of Pain)    Pain Score: Pre-procedure: 6 /10 Post-procedure: 0-No pain/10  Pre-op Assessment:  Ms.  Gomez is a 62 y.o. (year old), female patient, seen today for interventional treatment. She  has a past surgical history that includes Cholecystectomy; Abdominal hysterectomy; and Spinal fusion. Jean Gomez has a current medication list which includes the following prescription(s): albuterol sulfate, amlodipine, aspirin ec, atorvastatin, betamethasone (augmented), clotrimazole-betamethasone, diclofenac sodium, escitalopram, hydrochlorothiazide, hydrocodone-acetaminophen, hydrocodone-acetaminophen, ipratropium, levetiracetam, losartan, nitroglycerin, pantoprazole, potassium chloride sa, sucralfate, tizanidine, and torsemide, and the following Facility-Administered Medications: midazolam and triamcinolone acetonide. Her primarily concern today is the Back Pain (low) and Knee Pain (bilateral)  Initial Vital Signs:  Pulse/HCG Rate: 95ECG Heart Rate: (!) 103 Temp: 98.4 F (36.9 C) Resp: 18 BP: (!) 162/114 SpO2: 98 %  BMI: Estimated body mass index is 29.18 kg/m as calculated from the following:   Height as of this encounter: 5\' 4"  (1.626 m).   Weight as of this encounter: 170 lb (77.1 kg).  Risk Assessment: Allergies: Reviewed. She is allergic to contrast media [iodinated diagnostic agents]; gabapentin; labetalol; sulfabenzamide; fentanyl; and penicillins.  Allergy Precautions: No iodine containing solutions or radiological contrast used. Coagulopathies: Reviewed. None identified.  Blood-thinner therapy: None at this time Active Infection(s): Reviewed. None identified. Jean Gomez is afebrile  Site Confirmation: Jean Gomez was asked to confirm the procedure and laterality before marking the site Procedure checklist: Completed Consent: Before the procedure and under the influence of no sedative(s), amnesic(s), or anxiolytics, the patient was informed of the treatment options, risks and possible complications. To fulfill our ethical and legal obligations, as recommended by the American Medical  Association's Code of Ethics, I have informed the patient of my clinical impression; the nature and purpose of the treatment or procedure; the risks, benefits, and possible complications of the intervention; the alternatives, including doing nothing; the risk(s) and benefit(s) of  the alternative treatment(s) or procedure(s); and the risk(s) and benefit(s) of doing nothing. The patient was provided information about the general risks and possible complications associated with the procedure. These may include, but are not limited to: failure to achieve desired goals, infection, bleeding, organ or nerve damage, allergic reactions, paralysis, and death. In addition, the patient was informed of those risks and complications associated to the procedure, such as failure to decrease pain; infection; bleeding; organ or nerve damage with subsequent damage to sensory, motor, and/or autonomic systems, resulting in permanent pain, numbness, and/or weakness of one or several areas of the body; allergic reactions; (i.e.: anaphylactic reaction); and/or death. Furthermore, the patient was informed of those risks and complications associated with the medications. These include, but are not limited to: allergic reactions (i.e.: anaphylactic or anaphylactoid reaction(s)); adrenal axis suppression; blood sugar elevation that in diabetics may result in ketoacidosis or comma; water retention that in patients with history of congestive heart failure may result in shortness of breath, pulmonary edema, and decompensation with resultant heart failure; weight gain; swelling or edema; medication-induced neural toxicity; particulate matter embolism and blood vessel occlusion with resultant organ, and/or nervous system infarction; and/or aseptic necrosis of one or more joints. Finally, the patient was informed that Medicine is not an exact science; therefore, there is also the possibility of unforeseen or unpredictable risks and/or possible  complications that may result in a catastrophic outcome. The patient indicated having understood very clearly. We have given the patient no guarantees and we have made no promises. Enough time was given to the patient to ask questions, all of which were answered to the patient's satisfaction. [name] has indicated that she wanted to continue with the procedure. Attestation: I, the ordering provider, attest that I have discussed with the patient the benefits, risks, side-effects, alternatives, likelihood of achieving goals, and potential problems during recovery for the procedure that I have provided informed consent. Date  Time: 07/04/2018  9:06 AM  Pre-Procedure Preparation:  Monitoring: As per clinic protocol. Respiration, ETCO2, SpO2, BP, heart rate and rhythm monitor placed and checked for adequate function Safety Precautions: Patient was assessed for positional comfort and pressure points before starting the procedure. Time-out: I initiated and conducted the "Time-out" before starting the procedure, as per protocol. The patient was asked to participate by confirming the accuracy of the "Time Out" information. Verification of the correct person, site, and procedure were performed and confirmed by me, the nursing staff, and the patient. "Time-out" conducted as per Joint Commission's Universal Protocol (UP.01.01.01). Time: 1128  1008 hrs  Description of Procedure #1:  Position: Sitting Target Area: Knee Joint Approach: Just above the Lateral tibial plateau, lateral to the infrapatellar tendon. Area Prepped: Entire knee area, from the mid-thigh to the mid-shin. Prepping solution: ChloraPrep (2% chlorhexidine gluconate and 70% isopropyl alcohol) Safety Precautions: Aspiration looking for blood return was conducted prior to all injections. At no point did we inject any substances, as a needle was being advanced. No attempts were made at seeking any paresthesias. Safe injection practices and needle  disposal techniques used. Medications properly checked for expiration dates. SDV (single dose vial) medications used. Description of the Procedure: Protocol guidelines were followed. The patient was placed in position over the fluoroscopy table. The target area was identified and the area prepped in the usual manner. Skin & deeper tissues infiltrated with local anesthetic. Appropriate amount of time allowed to pass for local anesthetics to take effect. The procedure needles were then advanced to the  target area. Proper needle placement secured. Negative aspiration confirmed. Solution injected in intermittent fashion, asking for systemic symptoms every 0.5cc of injectate. The needles were then removed and the area cleansed, making sure to leave some of the prepping solution back to take advantage of its long term bactericidal properties. Vitals:   07/04/18 1142 07/04/18 1149 07/04/18 1159 07/04/18 1208  BP: (!) 147/74 (!) 191/100 (!) 166/95 (!) 152/93  Pulse:      Resp: 19 16 20  (!) 23  Temp:  98.3 F (36.8 C)  98.1 F (36.7 C)  SpO2: 100% 97% 100% 100%  Weight:      Height:        Start Time: 1010 hrs.  Materials:  Needle(s) Type: Regular needle Gauge: 25G Length: 1.5-in Medication(s): Please see orders for medications and dosing details.  Imaging Guidance for procedure #1:  Type of Imaging Technique: None used Indication(s): N/A Exposure Time: No patient exposure Contrast: None used. Fluoroscopic Guidance: N/A Ultrasound Guidance: N/A Interpretation: N/A  Description of Procedure #2:  Position: Prone Laterality: Bilateral. The procedure was performed in identical fashion on both sides. Levels:  L2, L3, L4, L5, & S1 Medial Branch Level(s) Area Prepped: Posterior Lumbosacral Region Prepping solution: ChloraPrep (2% chlorhexidine gluconate and 70% isopropyl alcohol) Safety Precautions: Aspiration looking for blood return was conducted prior to all injections. At no point did we  inject any substances, as a needle was being advanced. Before injecting, the patient was told to immediately notify me if she was experiencing any new onset of "ringing in the ears, or metallic taste in the mouth". No attempts were made at seeking any paresthesias. Safe injection practices and needle disposal techniques used. Medications properly checked for expiration dates. SDV (single dose vial) medications used. After the completion of the procedure, all disposable equipment used was discarded in the proper designated medical waste containers. Local Anesthesia: Protocol guidelines were followed. The patient was positioned over the fluoroscopy table. The area was prepped in the usual manner. The time-out was completed. The target area was identified using fluoroscopy. A 12-in long, straight, sterile hemostat was used with fluoroscopic guidance to locate the targets for each level blocked. Once located, the skin was marked with an approved surgical skin marker. Once all sites were marked, the skin (epidermis, dermis, and hypodermis), as well as deeper tissues (fat, connective tissue and muscle) were infiltrated with a small amount of a short-acting local anesthetic, loaded on a 10cc syringe with a 25G, 1.5-in  Needle. An appropriate amount of time was allowed for local anesthetics to take effect before proceeding to the next step. Local Anesthetic: Lidocaine 2.0% The unused portion of the local anesthetic was discarded in the proper designated containers. Technical explanation of process:  L2 Medial Branch Nerve Block (MBB): The target area for the L2 medial branch is at the junction of the postero-lateral aspect of the superior articular process and the superior, posterior, and medial edge of the transverse process of L3. Under fluoroscopic guidance, a Quincke needle was inserted until contact was made with os over the superior postero-lateral aspect of the pedicular shadow (target area). After negative  aspiration for blood, 0.5 mL of the nerve block solution was injected without difficulty or complication. The needle was removed intact. L3 Medial Branch Nerve Block (MBB): The target area for the L3 medial branch is at the junction of the postero-lateral aspect of the superior articular process and the superior, posterior, and medial edge of the transverse process of L4. Under  fluoroscopic guidance, a Quincke needle was inserted until contact was made with os over the superior postero-lateral aspect of the pedicular shadow (target area). After negative aspiration for blood, 0.5 mL of the nerve block solution was injected without difficulty or complication. The needle was removed intact. L4 Medial Branch Nerve Block (MBB): The target area for the L4 medial branch is at the junction of the postero-lateral aspect of the superior articular process and the superior, posterior, and medial edge of the transverse process of L5. Under fluoroscopic guidance, a Quincke needle was inserted until contact was made with os over the superior postero-lateral aspect of the pedicular shadow (target area). After negative aspiration for blood, 0.5 mL of the nerve block solution was injected without difficulty or complication. The needle was removed intact. L5 Medial Branch Nerve Block (MBB): The target area for the L5 medial branch is at the junction of the postero-lateral aspect of the superior articular process and the superior, posterior, and medial edge of the sacral ala. Under fluoroscopic guidance, a Quincke needle was inserted until contact was made with os over the superior postero-lateral aspect of the pedicular shadow (target area). After negative aspiration for blood, 0.5 mL of the nerve block solution was injected without difficulty or complication. The needle was removed intact. S1 Medial Branch Nerve Block (MBB): The target area for the S1 medial branch is at the posterior and inferior 6 o'clock position of the L5-S1  facet joint. Under fluoroscopic guidance, the Quincke needle inserted for the L5 MBB was redirected until contact was made with os over the inferior and postero aspect of the sacrum, at the 6 o' clock position under the L5-S1 facet joint (Target area). After negative aspiration for blood, 0.5 mL of the nerve block solution was injected without difficulty or complication. The needle was removed intact. Procedural Needles: 22-gauge, 3.5-inch, Quincke needles used for all levels. Nerve block solution: 0.2% PF-Ropivacaine + Triamcinolone (40 mg/mL) diluted to a final concentration of 4 mg of Triamcinolone/mL of Ropivacaine The unused portion of the solution was discarded in the proper designated containers.  Once the entire procedure was completed, the treated area was cleaned, making sure to leave some of the prepping solution back to take advantage of its long term bactericidal properties.   Illustration of the posterior view of the lumbar spine and the posterior neural structures. Laminae of L2 through S1 are labeled. DPRL5, dorsal primary ramus of L5; DPRS1, dorsal primary ramus of S1; DPR3, dorsal primary ramus of L3; FJ, facet (zygapophyseal) joint L3-L4; I, inferior articular process of L4; LB1, lateral branch of dorsal primary ramus of L1; IAB, inferior articular branches from L3 medial branch (supplies L4-L5 facet joint); IBP, intermediate branch plexus; MB3, medial branch of dorsal primary ramus of L3; NR3, third lumbar nerve root; S, superior articular process of L5; SAB, superior articular branches from L4 (supplies L4-5 facet joint also); TP3, transverse process of L3.  Vitals:   07/04/18 1142 07/04/18 1149 07/04/18 1159 07/04/18 1208  BP: (!) 147/74 (!) 191/100 (!) 166/95 (!) 152/93  Pulse:      Resp: 19 16 20  (!) 23  Temp:  98.3 F (36.8 C)  98.1 F (36.7 C)  SpO2: 100% 97% 100% 100%  Weight:      Height:       End Time: 1141 hrs.  Imaging Guidance (Spinal) for procedure #2:  Type  of Imaging Technique: Fluoroscopy Guidance (Spinal) Indication(s): Assistance in needle guidance and placement for procedures requiring needle  placement in or near specific anatomical locations not easily accessible without such assistance. Exposure Time: Please see nurses notes. Contrast: None used. Fluoroscopic Guidance: I was personally present during the use of fluoroscopy. "Tunnel Vision Technique" used to obtain the best possible view of the target area. Parallax error corrected before commencing the procedure. "Direction-depth-direction" technique used to introduce the needle under continuous pulsed fluoroscopy. Once target was reached, antero-posterior, oblique, and lateral fluoroscopic projection used confirm needle placement in all planes. Images permanently stored in EMR. Interpretation: No contrast injected. I personally interpreted the imaging intraoperatively. Adequate needle placement confirmed in multiple planes. Permanent images saved into the patient's record.  Antibiotic Prophylaxis:   Anti-infectives (From admission, onward)   None     Indication(s): None identified  Post-operative Assessment:  Post-procedure Vital Signs:  Pulse/HCG Rate: 95100 Temp: 98.1 F (36.7 C) Resp: (!) 23 BP: (!) 152/93 SpO2: 100 %  EBL: None  Complications: No immediate post-treatment complications observed by team, or reported by patient.  Note: The patient tolerated the entire procedure well. A repeat set of vitals were taken after the procedure and the patient was kept under observation following institutional policy, for this type of procedure. Post-procedural neurological assessment was performed, showing return to baseline, prior to discharge. The patient was provided with post-procedure discharge instructions, including a section on how to identify potential problems. Should any problems arise concerning this procedure, the patient was given instructions to immediately contact us, at  any time, without hesitation. In any case, we plan to contact the patient by telephone for a follow-up status report regarding this interventional procedure.  Comments:  No additional relevant information.  Plan of Care   Imaging Orders     DG C-Arm 1-60 Min-No Report  Procedure Orders     KNEE INJECTION     LUMBAR FACET(MEDIAL BRANCH NERVE BLOCK) MBNB  Medications ordered for procedure: Meds ordered this encounter  Medications  . lidocaine (PF) (XYLOCAINE) 1 % injection 4 mL  . ropivacaine (PF) 2 mg/mL (0.2%) (NAROPIN) injection 4 mL  . Sodium Hyaluronate SOSY 2 mL  . Sodium Hyaluronate SOSY 2 mL  . lidocaine (XYLOCAINE) 2 % (with pres) injection 400 mg  . midazolam (VERSED) 5 MG/5ML injection 1-2 mg    Make sure Flumazenil is available in the pyxis when using this medication. If oversedation occurs, administer 0.2 mg IV over 15 sec. If after 45 sec no response, administer 0.2 mg again over 1 min; may repeat at 1 min intervals; not to exceed 4 doses (1 mg)  . lactated ringers infusion 1,000 mL  . ropivacaine (PF) 2 mg/mL (0.2%) (NAROPIN) injection 18 mL  . triamcinolone acetonide (KENALOG-40) injection 80 mg   Medications administered: We administered lidocaine (PF), ropivacaine (PF) 2 mg/mL (0.2%), Sodium Hyaluronate, Sodium Hyaluronate, lidocaine, midazolam, lactated ringers, and ropivacaine (PF) 2 mg/mL (0.2%).  See the medical record for exact dosing, route, and time of administration.  New Prescriptions   No medications on file   Disposition: Discharge home  Discharge Date & Time: 07/04/2018; 1207 hrs.   Physician-requested Follow-up: Return for post-procedure eval (2 wks).  Future Appointments  Date Time Provider Department Center  07/17/2018  2:00 PM Delano Metz, MD [facility] None  08/26/2018  8:30 AM Barbette Merino, NP [facility] None   Primary Care Physician: Lauro Regulus, MD Location: [facility] Note by: Oswaldo Done, MD Date: 07/04/2018; Time: 12:14 PM  Disclaimer:  Medicine is not an Visual merchandiser.  The only guarantee in medicine is that nothing is guaranteed. It is important to note that the decision to proceed with this intervention was based on the information collected from the patient. The Data and conclusions were drawn from the patient's questionnaire, the interview, and the physical examination. Because the information was provided in large part by the patient, it cannot be guaranteed that it has not been purposely or unconsciously manipulated. Every effort has been made to obtain as much relevant data as possible for this evaluation. It is important to note that the conclusions that lead to this procedure are derived in large part from the available data. Always take into account that the treatment will also be dependent on availability of resources and existing treatment guidelines, considered by other Pain Management Practitioners as being common knowledge and practice, at the time of the intervention. For Medico-Legal purposes, it is also important to point out that variation in procedural techniques and pharmacological choices are the acceptable norm. The indications, contraindications, technique, and results of the above procedure should only be interpreted and judged by a Board-Certified Interventional Pain Specialist with extensive familiarity and expertise in the same exact procedure and technique.

## 2018-07-04 NOTE — Progress Notes (Signed)
Safety precautions to be maintained throughout the outpatient stay will include: orient to surroundings, keep bed in low position, maintain call bell within reach at all times, provide assistance with transfer out of bed and ambulation.  

## 2018-07-05 ENCOUNTER — Telehealth: Payer: Self-pay | Admitting: *Deleted

## 2018-07-05 NOTE — Telephone Encounter (Signed)
No problems post procedure. 

## 2018-07-17 ENCOUNTER — Ambulatory Visit: Payer: Self-pay | Admitting: Pain Medicine

## 2018-08-06 NOTE — Progress Notes (Signed)
Patient's Name: Jean Gomez  MRN: [id number]  Referring Provider: Kirk Ruths, MD  DOB: [birth year]  PCP: Kirk Ruths, MD  DOS: 08/07/2018  Note by: Gaspar Cola, MD  Service setting: Ambulatory outpatient  Specialty: Interventional Pain Management  Location: [facility] (AMB) Pain Management Facility    Patient type: Established   Primary Reason(s) for Visit: Encounter for post-procedure evaluation of chronic illness with mild to moderate exacerbation CC: Back Pain (right lower)  HPI  Jean Gomez is a 62 y.o. year old, female patient, who comes today for a post-procedure evaluation. She has Chronic pain syndrome; Pharmacologic therapy; Disorder of skeletal system; Problems influencing health status; Long term current use of opiate analgesic; Chronic low back pain (Bilateral) with right-sided sciatica (Secondary Area of Pain) (R>L); Chronic lower extremity pain (Tertiary Area of Pain) (Bilateral) (R>L); Chronic knee pain (Fourth Area of Pain) (Bilateral) (R>L); Chronic shoulder pain (Fifth Area of Pain) (Bilateral) (R>L); Occipital neuralgia (Primary Area of Pain) (Bilateral) (R>L); Chronic neck pain (Bilateral) (R>L); Chronic sacroiliac joint pain (Right); Chronic migraine without aura; Chronic respiratory failure with hypoxia ([place]); Constipation; Epilepsy ([place]); HTN, goal below 140/90; Essential tremor; Generalized anxiety disorder; H/O aneurysm; GERD (gastroesophageal reflux disease); History of depression; History of seizure disorder; Hypercholesterolemia; Insomnia; Irritable bowel syndrome; Major depression in remission ([place]); Muscle spasm; Obstructive sleep apnea on CPAP; Osteoarthritis of knee (Bilateral) (R>L); COPD (chronic obstructive pulmonary disease) ([place]); Rotator cuff injury; Sarcoidosis; Spondylosis of lumbar region without myelopathy or radiculopathy; Vitamin D deficiency; Chronic shoulder pain (Right); Nonruptured cerebral aneurysm; Chronic headache disorder (Primary  Area of Pain); Pulmonary emphysema ([place]); Seizure disorder ([place]); Hiatal hernia; Occipital headache (Right); Frontal headache (Left); Chronic low back pain (Midline) (Secondary Area of Pain); Chronic lumbar radiculopathy (Right); Osteoarthritis involving multiple joints; Osteoarthritis of shoulder (Bilateral); Chronic musculoskeletal pain; Lumbar facet joint syndrome (Bilateral) (R>L); Chronic hip pain (Tertiary Area of Pain) (Bilateral) (R>L); DDD (degenerative disc disease), cervical; Cervical facet arthropathy (Bilateral); Cervical facet syndrome (Bilateral) (R>L); Cervical Grade 1 Anterolisthesis of C4 over C5 (Degenerative); Cervical Grade 1 Retrolisthesis of C5 over C6; Cervical foraminal stenosis (C3-4, C4-5, and C5-6) (Bilateral); DDD (degenerative disc disease), lumbar; Osteoarthritis of lumbar spine; Osteoarthritis of facet joint of lumbar spine; Spondylosis without myelopathy or radiculopathy, cervicothoracic region; Bipolar affective disorder, current episode manic ([place]); History of allergy to radiographic contrast media; and Lumbar spondylosis on their problem list. Her primarily concern today is the Back Pain (right lower)  Pain Assessment: Location: Lower, Right Back Radiating: radiates down right leg to bottom of foot in the back   Onset: More than a month ago Duration: Chronic pain Quality: Sharp, Radiating, Constant Severity: 6 /10 (subjective, self-reported pain score)  Note: Reported level is inconsistent with clinical observations. Clinically the patient looks like a 3/10 A 3/10 is viewed as "Moderate" and described as significantly interfering with activities of daily living (ADL). It becomes difficult to feed, bathe, get dressed, get on and off the toilet or to perform personal hygiene functions. Difficult to get in and out of bed or a chair without assistance. Very distracting. With effort, it can be ignored when deeply involved in activities. Information on the proper use of the  pain scale provided to the patient today. When using our objective Pain Scale, levels between 6 and 10/10 are said to belong in an emergency room, as it progressively worsens from a 6/10, described as severely limiting, requiring emergency care not usually available at an outpatient pain management facility. At a 6/10  level, communication becomes difficult and requires great effort. Assistance to reach the emergency department may be required. Facial flushing and profuse sweating along with potentially dangerous increases in heart rate and blood pressure will be evident. Effect on ADL: "I cant do anything" Timing: Constant Modifying factors: lying down , medications BP: (!) 157/114  HR: (!) 110  Jean Gomez comes in today for post-procedure evaluation after the treatment done on 07/05/2018.  Further details on both, my assessment(s), as well as the proposed treatment plan, please see below.  Post-Procedure Assessment  07/04/2018 Procedure: Diagnostic bilateral lumbar facet block #1 under fluoroscopic guidance and IV sedation + therapeutic bilateral Hyalgan knee injection #5  Pre-procedure pain score:  6/10 Post-procedure pain score: 0/10 (100% relief) Influential Factors: BMI: 28.67 kg/m Intra-procedural challenges: None observed.         Assessment challenges: None detected.              Reported side-effects: None.        Post-procedural adverse reactions or complications: None reported         Sedation: Sedation provided. When no sedatives are used, the analgesic levels obtained are directly associated to the effectiveness of the local anesthetics. However, when sedation is provided, the level of analgesia obtained during the initial 1 hour following the intervention, is believed to be the result of a combination of factors. These factors may include, but are not limited to: 1. The effectiveness of the local anesthetics used. 2. The effects of the analgesic(s) and/or anxiolytic(s) used. 3.  The degree of discomfort experienced by the patient at the time of the procedure. 4. The patients ability and reliability in recalling and recording the events. 5. The presence and influence of possible secondary gains and/or psychosocial factors. Reported result: Relief experienced during the 1st hour after the procedure: 100 % (Ultra-Short Term Relief) Ms. Shinsky has indicated area to have been numb during this time. Interpretative annotation: Clinically appropriate result. Analgesia during this period is likely to be Local Anesthetic and/or IV Sedative (Analgesic/Anxiolytic) related.          Effects of local anesthetic: The analgesic effects attained during this period are directly associated to the localized infiltration of local anesthetics and therefore cary significant diagnostic value as to the etiological location, or anatomical origin, of the pain. Expected duration of relief is directly dependent on the pharmacodynamics of the local anesthetic used. Long-acting (4-6 hours) anesthetics used.  Reported result: Relief during the next 4 to 6 hour after the procedure: 100 % (Short-Term Relief)            Interpretative annotation: Clinically appropriate result. Analgesia during this period is likely to be Local Anesthetic-related.          Long-term benefit: Defined as the period of time past the expected duration of local anesthetics (1 hour for short-acting and 4-6 hours for long-acting). With the possible exception of prolonged sympathetic blockade from the local anesthetics, benefits during this period are typically attributed to, or associated with, other factors such as analgesic sensory neuropraxia, antiinflammatory effects, or beneficial biochemical changes provided by agents other than the local anesthetics.  Reported result: Extended relief following procedure: (0% knees, 0% back) (Long-Term Relief)            Interpretative annotation: Clinically possible results. No long-term benefit.  No permanent benefit expected. Limited inflammation. Possible mechanical aggravating factors.          Current benefits: Defined as reported results that persistent at  this point in time.   Analgesia: 0 %            Function: Back to baseline ROM: Back to baseline Interpretative annotation: Recurrence of symptoms. No permanent benefit expected. Effective diagnostic intervention.          Interpretation: Results would suggest a successful diagnostic intervention.                  Plan:  Since the patient appears to have failed a series of 5 intra-articular Hyalgan knee injections, we will go ahead and order bilateral knee MRIs to determine if the condition has can to a point were surgery may be of benefit. In addition, the diagnostic bilateral lumbar facet block demonstrated that the patient's pain appears to originate from the lumbar facet, when he comes to her lower back pain. Unfortunately, she did not seem to have obtained any long-term benefit with the steroids suggesting that the problem is more mechanical. We will proceed with a second diagnostic injection. The patient indicates that she has already had physical therapy and therefore this part has been covert. She indicated that the physical therapy did not help her pain and in fact made it worse.                Laboratory Chemistry  Inflammation Markers (CRP: Acute Phase) (ESR: Chronic Phase) Lab Results  Component Value Date   CRP <0.8 02/07/2018   ESRSEDRATE 53 (H) 02/07/2018                         Renal Markers Lab Results  Component Value Date   BUN 10 04/18/2018   CREATININE 0.60 04/18/2018   GFRAA >60 04/18/2018   GFRNONAA >60 04/18/2018                             Hepatic Markers Lab Results  Component Value Date   AST 31 04/17/2018   ALT 22 04/17/2018   ALBUMIN 4.9 04/17/2018                        Neuropathy Markers Lab Results  Component Value Date   VITAMINB12 268 02/07/2018   HGBA1C 5.9 (H) 04/18/2018                         Hematology Parameters Lab Results  Component Value Date   PLT 423 04/17/2018   HGB 10.8 (L) 04/17/2018   HCT 33.1 (L) 04/17/2018                        CV Markers No results found.  Note: Lab results reviewed.  Recent Imaging Results   Results for orders placed in visit on 07/04/18  DG C-Arm 1-60 Min-No Report   Narrative Fluoroscopy was utilized by the requesting physician.  No radiographic  interpretation.    Interpretation Report: Fluoroscopy was used during the procedure to assist with needle guidance. The images were interpreted intraoperatively by the requesting physician.  Meds   Current Outpatient Medications:  .  Albuterol Sulfate 108 (90 Base) MCG/ACT AEPB, Inhale 2 puffs into the lungs every 6 (six) hours as needed., Disp: , Rfl:  .  amLODipine (NORVASC) 10 MG tablet, Take 1 tablet (10 mg total) by mouth daily., Disp: 30 tablet, Rfl: 0 .  aspirin EC 81 MG tablet, Take 1  tablet (81 mg total) by mouth daily., Disp: 30 tablet, Rfl: 0 .  atorvastatin (LIPITOR) 10 MG tablet, Take 1 tablet (10 mg total) by mouth daily., Disp: 30 tablet, Rfl: 0 .  betamethasone, augmented, (DIPROLENE) 0.05 % lotion, Apply topically 2 (two) times daily., Disp: , Rfl:  .  clotrimazole-betamethasone (LOTRISONE) cream, Apply 1 application topically 2 (two) times daily., Disp: 30 g, Rfl: 0 .  diclofenac sodium (VOLTAREN) 1 % GEL, Apply 2 g topically 4 (four) times daily., Disp: 3 Tube, Rfl: 1 .  escitalopram (LEXAPRO) 20 MG tablet, Take 1 tablet (20 mg total) by mouth daily., Disp: 30 tablet, Rfl: 0 .  hydrochlorothiazide (HYDRODIURIL) 25 MG tablet, Take 1 tablet (25 mg total) by mouth daily., Disp: 30 tablet, Rfl: 0 .  HYDROcodone-acetaminophen (NORCO) 10-325 MG tablet, Take 1 tablet by mouth every 8 (eight) hours as needed for severe pain., Disp: 90 tablet, Rfl: 0 .  ipratropium (ATROVENT) 0.02 % nebulizer solution, Take 0.5 mg by nebulization every 4 (four) hours as needed  for wheezing or shortness of breath., Disp: , Rfl:  .  levETIRAcetam (KEPPRA) 750 MG tablet, Take 1 tablet (750 mg total) by mouth daily., Disp: 30 tablet, Rfl: 0 .  losartan (COZAAR) 25 MG tablet, Take 1 tablet (25 mg total) by mouth daily., Disp: 30 tablet, Rfl: 0 .  nitroGLYCERIN (NITROSTAT) 0.4 MG SL tablet, Place 0.4 mg under the tongue every 5 (five) minutes as needed for chest pain., Disp: , Rfl:  .  pantoprazole (PROTONIX) 40 MG tablet, Take 1 tablet (40 mg total) by mouth daily., Disp: 30 tablet, Rfl: 0 .  potassium chloride SA (K-DUR,KLOR-CON) 20 MEQ tablet, Take 1 tablet (20 mEq total) by mouth 2 (two) times daily., Disp: 30 tablet, Rfl: 0 .  sucralfate (CARAFATE) 1 g tablet, Take 1 tablet (1 g total) by mouth 4 (four) times daily., Disp: 120 tablet, Rfl: 0 .  tiZANidine (ZANAFLEX) 4 MG tablet, Take 1 tablet (4 mg total) by mouth 3 (three) times daily., Disp: 90 tablet, Rfl: 1 .  torsemide (DEMADEX) 5 MG tablet, , Disp: , Rfl: 11 .  diazepam (VALIUM) 5 MG tablet, Take 1 tablet (5 mg total) by mouth as needed for up to 2 doses for anxiety (Take one tab 45 minutes before MRI. Take second tablet just prior to MRI scan). Do not take medication within 4 hours of taking opioid pain medications. Must have a driver. Do not drive or operate machinery x 24 hours after taking this medication., Disp: 2 tablet, Rfl: 0 .  HYDROcodone-acetaminophen (NORCO) 10-325 MG tablet, Take 1 tablet by mouth every 8 (eight) hours as needed for severe pain., Disp: 90 tablet, Rfl: 0  ROS  Constitutional: Denies any fever or chills Gastrointestinal: No reported hemesis, hematochezia, vomiting, or acute GI distress Musculoskeletal: Denies any acute onset joint swelling, redness, loss of ROM, or weakness Neurological: No reported episodes of acute onset apraxia, aphasia, dysarthria, agnosia, amnesia, paralysis, loss of coordination, or loss of consciousness  Allergies  Ms. Mcadams is allergic to contrast media  [iodinated diagnostic agents]; gabapentin; labetalol; sulfabenzamide; fentanyl; and penicillins.  [place]  Drug: Ms. Geffrard  reports that she does not use drugs. Alcohol:  reports that she drinks alcohol. Tobacco:  reports that she has never smoked. She has never used smokeless tobacco. Medical:  has a past medical history of Asthma, Cerebral aneurysm, Chest pain (11/01/2016), Chronic migraine, Chronic respiratory failure ([place]), COPD (chronic obstructive pulmonary disease) ([place]), Hypertension, Osteoarthritis, Sarcoid, and  Seizures ([place]). Surgical: Ms. Quinnell  has a past surgical history that includes Cholecystectomy; Abdominal hysterectomy; and Spinal fusion. Family: family history is not on file.  Constitutional Exam  General appearance: Well nourished, well developed, and well hydrated. In no apparent acute distress Vitals:   08/07/18 0836 08/07/18 0837  BP:  (!) 157/114  Pulse: (!) 110   Resp: 18   SpO2: 100%   Weight: 167 lb (75.8 kg)   Height: '5\' 4"'$  (1.626 m)    BMI Assessment: Estimated body mass index is 28.67 kg/m as calculated from the following:   Height as of this encounter: '5\' 4"'$  (1.626 m).   Weight as of this encounter: 167 lb (75.8 kg).  BMI interpretation table: BMI level Category Range association with higher incidence of chronic pain  <18 kg/m2 Underweight   18.5-24.9 kg/m2 Ideal body weight   25-29.9 kg/m2 Overweight Increased incidence by 20%  30-34.9 kg/m2 Obese (Class I) Increased incidence by 68%  35-39.9 kg/m2 Severe obesity (Class II) Increased incidence by 136%  >40 kg/m2 Extreme obesity (Class III) Increased incidence by 254%   Patient's current BMI Ideal Body weight  Body mass index is 28.67 kg/m. Ideal body weight: 54.7 kg (120 lb 9.5 oz) Adjusted ideal body weight: 63.1 kg (139 lb 2.5 oz)   BMI Readings from Last 4 Encounters:  08/07/18 28.67 kg/m  07/04/18 29.18 kg/m  06/25/18 28.32 kg/m  05/30/18 29.01 kg/m   Wt Readings from Last 4  Encounters:  08/07/18 167 lb (75.8 kg)  07/04/18 170 lb (77.1 kg)  06/25/18 165 lb (74.8 kg)  05/30/18 169 lb (76.7 kg)  Psych/Mental status: Alert, oriented x 3 (person, place, & time)       Eyes: PERLA Respiratory: No evidence of acute respiratory distress  Cervical Spine Area Exam  Skin & Axial Inspection: No masses, redness, edema, swelling, or associated skin lesions Alignment: Symmetrical Functional ROM: Unrestricted ROM      Stability: No instability detected Muscle Tone/Strength: Functionally intact. No obvious neuro-muscular anomalies detected. Sensory (Neurological): Unimpaired Palpation: No palpable anomalies              Upper Extremity (UE) Exam    Side: Right upper extremity  Side: Left upper extremity  Skin & Extremity Inspection: Skin color, temperature, and hair growth are WNL. No peripheral edema or cyanosis. No masses, redness, swelling, asymmetry, or associated skin lesions. No contractures.  Skin & Extremity Inspection: Skin color, temperature, and hair growth are WNL. No peripheral edema or cyanosis. No masses, redness, swelling, asymmetry, or associated skin lesions. No contractures.  Functional ROM: Unrestricted ROM          Functional ROM: Unrestricted ROM          Muscle Tone/Strength: Functionally intact. No obvious neuro-muscular anomalies detected.  Muscle Tone/Strength: Functionally intact. No obvious neuro-muscular anomalies detected.  Sensory (Neurological): Unimpaired          Sensory (Neurological): Unimpaired          Palpation: No palpable anomalies              Palpation: No palpable anomalies              Provocative Test(s):  Phalen's test: deferred Tinel's test: deferred Apley's scratch test (touch opposite shoulder):  Action 1 (Across chest): deferred Action 2 (Overhead): deferred Action 3 (LB reach): deferred   Provocative Test(s):  Phalen's test: deferred Tinel's test: deferred Apley's scratch test (touch opposite shoulder):  Action 1  (Across chest):  deferred Action 2 (Overhead): deferred Action 3 (LB reach): deferred    Thoracic Spine Area Exam  Skin & Axial Inspection: No masses, redness, or swelling Alignment: Symmetrical Functional ROM: Unrestricted ROM Stability: No instability detected Muscle Tone/Strength: Functionally intact. No obvious neuro-muscular anomalies detected. Sensory (Neurological): Unimpaired Muscle strength & Tone: No palpable anomalies  Lumbar Spine Area Exam  Skin & Axial Inspection: No masses, redness, or swelling Alignment: Symmetrical Functional ROM: Minimal ROM       Stability: No instability detected Muscle Tone/Strength: Functionally intact. No obvious neuro-muscular anomalies detected. Sensory (Neurological): Movement-associated pain Palpation: Complains of area being tender to palpation       Provocative Tests: Hyperextension/rotation test: (+) bilaterally for facet joint pain. Lumbar quadrant test (Kemp's test): (+) bilaterally for facet joint pain. Lateral bending test: deferred today       Patrick's Maneuver: deferred today                   FABER test: deferred today                   S-I anterior distraction/compression test: deferred today         S-I lateral compression test: deferred today         S-I Thigh-thrust test: deferred today         S-I Gaenslen's test: deferred today          Gait & Posture Assessment  Ambulation: Patient ambulates using a cane Gait: Limited. Using assistive device to ambulate Posture: Antalgic   Lower Extremity Exam    Side: Right lower extremity  Side: Left lower extremity  Stability: No instability observed          Stability: No instability observed          Skin & Extremity Inspection: Today the patient is wearing braces in both knees  Skin & Extremity Inspection: She is wearing braces in both knees.  Functional ROM: Decreased ROM for knee joint          Functional ROM: Decreased ROM for knee joint          Muscle Tone/Strength:  Functionally intact. No obvious neuro-muscular anomalies detected.  Muscle Tone/Strength: Functionally intact. No obvious neuro-muscular anomalies detected.  Sensory (Neurological): Articular pain pattern  Sensory (Neurological): Arthropathic arthralgia  Palpation: No palpable anomalies  Palpation: No palpable anomalies   Assessment  Primary Diagnosis & Pertinent Problem List: The primary encounter diagnosis was Chronic knee pain (Fourth Area of Pain) (Bilateral) (R>L). Diagnoses of Chronic low back pain (Midline) (Secondary Area of Pain), Lumbar facet joint syndrome (Bilateral) (R>L), Chronic sacroiliac joint pain (Right), Chronic headache disorder (Primary Area of Pain), Chronic lower extremity pain (Tertiary Area of Pain) (Bilateral) (R>L), Chronic hip pain (Tertiary Area of Pain) (Bilateral) (R>L), Chronic shoulder pain (Fifth Area of Pain) (Bilateral) (R>L), Spondylosis of lumbar region without myelopathy or radiculopathy, Osteoarthritis of facet joint of lumbar spine, Osteoarthritis of knee (Bilateral) (R>L), Chronic pain of left knee, Chronic pain of right knee, and Generalized anxiety disorder were also pertinent to this visit.  Status Diagnosis  Persistent Persistent Persistent 1. Chronic knee pain (Fourth Area of Pain) (Bilateral) (R>L)   2. Chronic low back pain (Midline) (Secondary Area of Pain)   3. Lumbar facet joint syndrome (Bilateral) (R>L)   4. Chronic sacroiliac joint pain (Right)   5. Chronic headache disorder (Primary Area of Pain)   6. Chronic lower extremity pain (Tertiary Area of Pain) (Bilateral) (R>L)  7. Chronic hip pain (Tertiary Area of Pain) (Bilateral) (R>L)   8. Chronic shoulder pain (Fifth Area of Pain) (Bilateral) (R>L)   9. Spondylosis of lumbar region without myelopathy or radiculopathy   10. Osteoarthritis of facet joint of lumbar spine   11. Osteoarthritis of knee (Bilateral) (R>L)   12. Chronic pain of left knee   13. Chronic pain of right knee   14.  Generalized anxiety disorder     Problems updated and reviewed during this visit: Problem  Lumbar Spondylosis  Bipolar Affective Disorder, Current Episode Manic ([facility])  Chest Pain (Resolved)   Plan of Care  Pharmacotherapy (Medications Ordered): Meds ordered this encounter  Medications  . diazepam (VALIUM) 5 MG tablet    Sig: Take 1 tablet (5 mg total) by mouth as needed for up to 2 doses for anxiety (Take one tab 45 minutes before MRI. Take second tablet just prior to MRI scan). Do not take medication within 4 hours of taking opioid pain medications. Must have a driver. Do not drive or operate machinery x 24 hours after taking this medication.    Dispense:  2 tablet    Refill:  0    Must have a driver. Do not drive or operate machinery x 24 hours after taking this medication.   Medications administered today: Shaunie F. Custer had no medications administered during this visit.   Procedure Orders     LUMBAR FACET(MEDIAL BRANCH NERVE BLOCK) [facility] Lab Orders  No laboratory test(s) ordered today    Imaging Orders     MR KNEE LEFT WO CONTRAST     MR KNEE RIGHT WO CONTRAST Referral Orders  No referral(s) requested today   Interventional management options: Planned, scheduled, and/or pending:   Diagnostic bilateral lumbar facet block #2 under fluoroscopic guidance and IV sedation  MRI of both knees    Considering:   Diagnostic bilateral greater occipital nerve block #2  Possible bilateral occipital nerve RFA  Possible bilateral occipital nerve peripheral nerves stimulator trial  Diagnostic bilateral cervical facet block  Possible bilateral cervical facet RFA  Diagnostic right-sided cervical epidural steroid injection  Diagnostic bilateral intra-articular shoulder joint injection  Diagnostic bilateral suprascapular nerve block  Possible bilateral suprascapular nerve RFA  Diagnostic bilateral lumbar facet block  Possible bilateral lumbar facet RFA  Diagnostic right-sided  lumbar epidural steroid injection  Diagnostic bilateral transforaminal epidural steroid injection  Diagnostic bilateral intra-articular hip joint injection  Diagnostic bilateral femoral nerve + obturator nerve block  Possible bilateral femoral nerve + obturator nerve RFA  Diagnostic bilateral intra-articular knee joint injection with local anesthetic and steroid  Possible series of 5 bilateral intra-articular Hyalgan knee injections  Diagnostic bilateral genicular nerve block  Possible bilateral genicular nerve RFA    Palliative PRN treatment(s):   Palliative bilateral intra-articular Hyalgan knee injection series #2 (last one completed on 07/04/2018)    Provider-requested follow-up: Return for Procedure (w/ sedation): (B) L-FCT BLK #2.  Future Appointments  Date Time Provider [place]  08/26/2018  8:30 AM Vevelyn Francois, NP [facility] None   Primary Care Physician: Kirk Ruths, MD Location: [facility] Note by: Gaspar Cola, MD Date: 08/07/2018; Time: 9:27 AM

## 2018-08-07 ENCOUNTER — Encounter: Payer: Self-pay | Admitting: Pain Medicine

## 2018-08-07 ENCOUNTER — Ambulatory Visit: Payer: Medicare Other | Attending: Pain Medicine | Admitting: Pain Medicine

## 2018-08-07 ENCOUNTER — Other Ambulatory Visit: Payer: Self-pay

## 2018-08-07 VITALS — BP 157/114 | HR 110 | Resp 18 | Ht 64.0 in | Wt 167.0 lb

## 2018-08-07 DIAGNOSIS — R51 Headache: Secondary | ICD-10-CM

## 2018-08-07 DIAGNOSIS — M47816 Spondylosis without myelopathy or radiculopathy, lumbar region: Secondary | ICD-10-CM

## 2018-08-07 DIAGNOSIS — M25561 Pain in right knee: Secondary | ICD-10-CM | POA: Diagnosis not present

## 2018-08-07 DIAGNOSIS — Z7982 Long term (current) use of aspirin: Secondary | ICD-10-CM | POA: Insufficient documentation

## 2018-08-07 DIAGNOSIS — M4312 Spondylolisthesis, cervical region: Secondary | ICD-10-CM | POA: Diagnosis not present

## 2018-08-07 DIAGNOSIS — M533 Sacrococcygeal disorders, not elsewhere classified: Secondary | ICD-10-CM | POA: Insufficient documentation

## 2018-08-07 DIAGNOSIS — D869 Sarcoidosis, unspecified: Secondary | ICD-10-CM | POA: Insufficient documentation

## 2018-08-07 DIAGNOSIS — G4733 Obstructive sleep apnea (adult) (pediatric): Secondary | ICD-10-CM | POA: Diagnosis not present

## 2018-08-07 DIAGNOSIS — M79605 Pain in left leg: Secondary | ICD-10-CM

## 2018-08-07 DIAGNOSIS — M5481 Occipital neuralgia: Secondary | ICD-10-CM | POA: Diagnosis not present

## 2018-08-07 DIAGNOSIS — K219 Gastro-esophageal reflux disease without esophagitis: Secondary | ICD-10-CM | POA: Diagnosis not present

## 2018-08-07 DIAGNOSIS — Z79899 Other long term (current) drug therapy: Secondary | ICD-10-CM | POA: Insufficient documentation

## 2018-08-07 DIAGNOSIS — Z79891 Long term (current) use of opiate analgesic: Secondary | ICD-10-CM | POA: Diagnosis not present

## 2018-08-07 DIAGNOSIS — G43709 Chronic migraine without aura, not intractable, without status migrainosus: Secondary | ICD-10-CM | POA: Diagnosis not present

## 2018-08-07 DIAGNOSIS — E559 Vitamin D deficiency, unspecified: Secondary | ICD-10-CM | POA: Diagnosis not present

## 2018-08-07 DIAGNOSIS — F319 Bipolar disorder, unspecified: Secondary | ICD-10-CM | POA: Insufficient documentation

## 2018-08-07 DIAGNOSIS — M4802 Spinal stenosis, cervical region: Secondary | ICD-10-CM | POA: Diagnosis not present

## 2018-08-07 DIAGNOSIS — I10 Essential (primary) hypertension: Secondary | ICD-10-CM | POA: Insufficient documentation

## 2018-08-07 DIAGNOSIS — R519 Headache, unspecified: Secondary | ICD-10-CM

## 2018-08-07 DIAGNOSIS — M25562 Pain in left knee: Secondary | ICD-10-CM | POA: Diagnosis not present

## 2018-08-07 DIAGNOSIS — M25512 Pain in left shoulder: Secondary | ICD-10-CM | POA: Diagnosis not present

## 2018-08-07 DIAGNOSIS — G894 Chronic pain syndrome: Secondary | ICD-10-CM | POA: Diagnosis not present

## 2018-08-07 DIAGNOSIS — J45909 Unspecified asthma, uncomplicated: Secondary | ICD-10-CM | POA: Insufficient documentation

## 2018-08-07 DIAGNOSIS — M25511 Pain in right shoulder: Secondary | ICD-10-CM | POA: Insufficient documentation

## 2018-08-07 DIAGNOSIS — F411 Generalized anxiety disorder: Secondary | ICD-10-CM

## 2018-08-07 DIAGNOSIS — M5030 Other cervical disc degeneration, unspecified cervical region: Secondary | ICD-10-CM | POA: Diagnosis not present

## 2018-08-07 DIAGNOSIS — M5441 Lumbago with sciatica, right side: Secondary | ICD-10-CM | POA: Diagnosis not present

## 2018-08-07 DIAGNOSIS — E7800 Pure hypercholesterolemia, unspecified: Secondary | ICD-10-CM | POA: Insufficient documentation

## 2018-08-07 DIAGNOSIS — M25552 Pain in left hip: Secondary | ICD-10-CM

## 2018-08-07 DIAGNOSIS — J449 Chronic obstructive pulmonary disease, unspecified: Secondary | ICD-10-CM | POA: Diagnosis not present

## 2018-08-07 DIAGNOSIS — G40909 Epilepsy, unspecified, not intractable, without status epilepticus: Secondary | ICD-10-CM | POA: Diagnosis not present

## 2018-08-07 DIAGNOSIS — M5116 Intervertebral disc disorders with radiculopathy, lumbar region: Secondary | ICD-10-CM | POA: Insufficient documentation

## 2018-08-07 DIAGNOSIS — G4700 Insomnia, unspecified: Secondary | ICD-10-CM | POA: Insufficient documentation

## 2018-08-07 DIAGNOSIS — M170 Bilateral primary osteoarthritis of knee: Secondary | ICD-10-CM | POA: Diagnosis not present

## 2018-08-07 DIAGNOSIS — J9611 Chronic respiratory failure with hypoxia: Secondary | ICD-10-CM | POA: Diagnosis not present

## 2018-08-07 DIAGNOSIS — M25551 Pain in right hip: Secondary | ICD-10-CM

## 2018-08-07 DIAGNOSIS — K589 Irritable bowel syndrome without diarrhea: Secondary | ICD-10-CM | POA: Diagnosis not present

## 2018-08-07 DIAGNOSIS — M79604 Pain in right leg: Secondary | ICD-10-CM

## 2018-08-07 DIAGNOSIS — G8929 Other chronic pain: Secondary | ICD-10-CM

## 2018-08-07 DIAGNOSIS — Z981 Arthrodesis status: Secondary | ICD-10-CM | POA: Insufficient documentation

## 2018-08-07 DIAGNOSIS — M4726 Other spondylosis with radiculopathy, lumbar region: Secondary | ICD-10-CM | POA: Insufficient documentation

## 2018-08-07 MED ORDER — DIAZEPAM 5 MG PO TABS: 5.0000 mg | ORAL_TABLET | ORAL | 0 refills | Status: DC | PRN

## 2018-08-07 NOTE — Patient Instructions (Signed)
____________________________________________________________________________________________  Preparing for Procedure with Sedation  Instructions: . Oral Intake: Do not eat or drink anything for at least 8 hours prior to your procedure. . Transportation: Public transportation is not allowed. Bring an adult driver. The driver must be physically present in our waiting room before any procedure can be started. . Physical Assistance: Bring an adult physically capable of assisting you, in the event you need help. This adult should keep you company at home for at least 6 hours after the procedure. . Blood Pressure Medicine: Take your blood pressure medicine with a sip of water the morning of the procedure. . Blood thinners: Notify our staff if you are taking any blood thinners. Depending on which one you take, there will be specific instructions on how and when to stop it. . Diabetics on insulin: Notify the staff so that you can be scheduled 1st case in the morning. If your diabetes requires high dose insulin, take only  of your normal insulin dose the morning of the procedure and notify the staff that you have done so. . Preventing infections: Shower with an antibacterial soap the morning of your procedure. . Build-up your immune system: Take 1000 mg of Vitamin C with every meal (3 times a day) the day prior to your procedure. . Antibiotics: Inform the staff if you have a condition or reason that requires you to take antibiotics before dental procedures. . Pregnancy: If you are pregnant, call and cancel the procedure. . Sickness: If you have a cold, fever, or any active infections, call and cancel the procedure. . Arrival: You must be in the facility at least 30 minutes prior to your scheduled procedure. . Children: Do not bring children with you. . Dress appropriately: Bring dark clothing that you would not mind if they get stained. . Valuables: Do not bring any jewelry or valuables.  Procedure  appointments are reserved for interventional treatments only. . No Prescription Refills. . No medication changes will be discussed during procedure appointments. . No disability issues will be discussed.  Reasons to call and reschedule or cancel your procedure: (Following these recommendations will minimize the risk of a serious complication.) . Surgeries: Avoid having procedures within 2 weeks of any surgery. (Avoid for 2 weeks before or after any surgery). . Flu Shots: Avoid having procedures within 2 weeks of a flu shots or . (Avoid for 2 weeks before or after immunizations). . Barium: Avoid having a procedure within 7-10 days after having had a radiological study involving the use of radiological contrast. (Myelograms, Barium swallow or enema study). . Heart attacks: Avoid any elective procedures or surgeries for the initial 6 months after a "Myocardial Infarction" (Heart Attack). . Blood thinners: It is imperative that you stop these medications before procedures. Let us know if you if you take any blood thinner.  . Infection: Avoid procedures during or within two weeks of an infection (including chest colds or gastrointestinal problems). Symptoms associated with infections include: Localized redness, fever, chills, night sweats or profuse sweating, burning sensation when voiding, cough, congestion, stuffiness, runny nose, sore throat, diarrhea, nausea, vomiting, cold or Flu symptoms, recent or current infections. It is specially important if the infection is over the area that we intend to treat. . Heart and lung problems: Symptoms that may suggest an active cardiopulmonary problem include: cough, chest pain, breathing difficulties or shortness of breath, dizziness, ankle swelling, uncontrolled high or unusually low blood pressure, and/or palpitations. If you are experiencing any of these symptoms, cancel   your procedure and contact your primary care physician for an evaluation.  Remember:   Regular Business hours are:  Monday to Thursday 8:00 AM to 4:00 PM  Provider's Schedule: Patric Buckhalter, MD:  Procedure days: Tuesday and Thursday 7:30 AM to 4:00 PM  Bilal Lateef, MD:  Procedure days: Monday and Wednesday 7:30 AM to 4:00 PM ____________________________________________________________________________________________    

## 2018-08-20 ENCOUNTER — Telehealth: Payer: Self-pay

## 2018-08-20 NOTE — Telephone Encounter (Signed)
Pt has procedure scheduled on 09/05 and request's (R) Shoilder inj if Dr Laban Emperor has the time, she's having a lot of shoulder pain.

## 2018-08-20 NOTE — Telephone Encounter (Signed)
Returned patient call.  Informed patient that he typically can only do one area at a time.  Informed to discuss wit with him when she comes in for procedure.

## 2018-08-22 ENCOUNTER — Other Ambulatory Visit: Payer: Self-pay

## 2018-08-22 ENCOUNTER — Ambulatory Visit (HOSPITAL_BASED_OUTPATIENT_CLINIC_OR_DEPARTMENT_OTHER): Payer: Medicare Other | Admitting: Pain Medicine

## 2018-08-22 ENCOUNTER — Encounter: Payer: Self-pay | Admitting: Pain Medicine

## 2018-08-22 ENCOUNTER — Ambulatory Visit
Admission: RE | Admit: 2018-08-22 | Discharge: 2018-08-22 | Disposition: A | Discharge status: Home / Self Care | Payer: Medicare Other | Source: Ambulatory Visit | Attending: Pain Medicine | Admitting: Pain Medicine

## 2018-08-22 VITALS — BP 140/84 | HR 84 | Temp 96.5°F | Resp 20 | Ht 65.0 in | Wt 165.0 lb

## 2018-08-22 DIAGNOSIS — Z9049 Acquired absence of other specified parts of digestive tract: Secondary | ICD-10-CM | POA: Insufficient documentation

## 2018-08-22 DIAGNOSIS — M19011 Primary osteoarthritis, right shoulder: Secondary | ICD-10-CM

## 2018-08-22 DIAGNOSIS — M47817 Spondylosis without myelopathy or radiculopathy, lumbosacral region: Secondary | ICD-10-CM

## 2018-08-22 DIAGNOSIS — M25511 Pain in right shoulder: Secondary | ICD-10-CM | POA: Insufficient documentation

## 2018-08-22 DIAGNOSIS — M19012 Primary osteoarthritis, left shoulder: Secondary | ICD-10-CM

## 2018-08-22 DIAGNOSIS — M25512 Pain in left shoulder: Secondary | ICD-10-CM

## 2018-08-22 DIAGNOSIS — M47816 Spondylosis without myelopathy or radiculopathy, lumbar region: Secondary | ICD-10-CM | POA: Insufficient documentation

## 2018-08-22 DIAGNOSIS — M549 Dorsalgia, unspecified: Secondary | ICD-10-CM | POA: Insufficient documentation

## 2018-08-22 DIAGNOSIS — Z79899 Other long term (current) drug therapy: Secondary | ICD-10-CM | POA: Insufficient documentation

## 2018-08-22 DIAGNOSIS — Z981 Arthrodesis status: Secondary | ICD-10-CM | POA: Insufficient documentation

## 2018-08-22 DIAGNOSIS — M7918 Myalgia, other site: Secondary | ICD-10-CM

## 2018-08-22 DIAGNOSIS — G894 Chronic pain syndrome: Secondary | ICD-10-CM

## 2018-08-22 DIAGNOSIS — G8929 Other chronic pain: Secondary | ICD-10-CM

## 2018-08-22 DIAGNOSIS — Z888 Allergy status to other drugs, medicaments and biological substances status: Secondary | ICD-10-CM | POA: Insufficient documentation

## 2018-08-22 DIAGNOSIS — M5441 Lumbago with sciatica, right side: Secondary | ICD-10-CM

## 2018-08-22 MED ORDER — HYDROCODONE-ACETAMINOPHEN 10-325 MG PO TABS: 1.0000 | ORAL_TABLET | Freq: Three times a day (TID) | ORAL | 0 refills | Status: DC | PRN

## 2018-08-22 MED ORDER — ROPIVACAINE HCL 2 MG/ML IJ SOLN
18.0000 mL | Freq: Once | INTRAMUSCULAR | Status: AC
  Administered 2018-08-22: 18 mL via PERINEURAL
  Filled 2018-08-22: qty 20

## 2018-08-22 MED ORDER — LACTATED RINGERS IV SOLN
1000.0000 mL | Freq: Once | INTRAVENOUS | Status: AC
  Administered 2018-08-22: 1000 mL via INTRAVENOUS

## 2018-08-22 MED ORDER — LIDOCAINE HCL 2 % IJ SOLN
20.0000 mL | Freq: Once | INTRAMUSCULAR | Status: AC
  Administered 2018-08-22: 400 mg
  Filled 2018-08-22: qty 40

## 2018-08-22 MED ORDER — TRIAMCINOLONE ACETONIDE 40 MG/ML IJ SUSP
80.0000 mg | Freq: Once | INTRAMUSCULAR | Status: AC
  Administered 2018-08-22: 80 mg
  Filled 2018-08-22: qty 2

## 2018-08-22 MED ORDER — TIZANIDINE HCL 4 MG PO TABS: 4.0000 mg | ORAL_TABLET | Freq: Three times a day (TID) | ORAL | 1 refills | Status: DC

## 2018-08-22 MED ORDER — MIDAZOLAM HCL 5 MG/5ML IJ SOLN
1.0000 mg | INTRAMUSCULAR | Status: DC | PRN
  Administered 2018-08-22: 3 mg via INTRAVENOUS
  Filled 2018-08-22: qty 5

## 2018-08-22 NOTE — Progress Notes (Signed)
Patient's Name: Jean Gomez  MRN: [id number]  Referring Provider: Lauro Regulus, MD  DOB: 1956/09/22  PCP: Lauro Regulus, MD  DOS: 08/22/2018  Note by: Oswaldo Done, MD  Service setting: Ambulatory outpatient  Specialty: Interventional Pain Management  Patient type: Established  Location: [facility] (AMB) Pain Management Facility  Visit type: Interventional Procedure   Primary Reason for Visit: Interventional Pain Management Treatment. CC: Back Pain and Shoulder Pain (right)  Procedure:          Anesthesia, Analgesia, Anxiolysis:  Type: Lumbar Facet, Medial Branch Block(s) #2  Primary Purpose: Diagnostic Region: Posterolateral Lumbosacral Spine Level: L2, L3, L4, L5, & S1 Medial Branch Level(s). Injecting these levels blocks the L3-4, L4-5, and L5-S1 lumbar facet joints. Laterality: Bilateral  Type: Moderate (Conscious) Sedation combined with Local Anesthesia Indication(s): Analgesia and Anxiety Route: Intravenous (IV) IV Access: Secured Sedation: Meaningful verbal contact was maintained at all times during the procedure  Local Anesthetic: Lidocaine 1-2%   Indications: 1. Spondylosis without myelopathy or radiculopathy, lumbosacral region   2. 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 Osteoarthritis of facet joint of lumbar spine    Pain Score: Pre-procedure: 8 /10 Post-procedure: 0-No pain/10  Pre-op Assessment:  Jean Gomez is a 62 y.o. (year old), female patient, seen today for interventional treatment. She  has a past surgical history that includes Cholecystectomy; Abdominal hysterectomy; and Spinal fusion. Jean Gomez has a current medication list which includes the following prescription(s): albuterol sulfate, amlodipine, aspirin ec, atorvastatin, betamethasone (augmented), clotrimazole-betamethasone, diazepam, diclofenac sodium, escitalopram, hydrochlorothiazide, ipratropium, levetiracetam, losartan, nitroglycerin, pantoprazole, potassium chloride sa,  sucralfate, torsemide, hydrocodone-acetaminophen, hydrocodone-acetaminophen, and tizanidine, and the following Facility-Administered Medications: midazolam. Her primarily concern today is the Back Pain and Shoulder Pain (right)  The patient comes in today clinics today for a diagnostic bilateral lumbar facet block. However, she indicates that her right shoulder is giving her a lot of pain and she would like for [place] to injected today. I have explained to the patient that I rather do that on a separate visits that we can a 100% attention to which: On in the shoulder. Today we will go ahead and put the order to address her shoulder at the next visit.  Initial Vital Signs:  Pulse/HCG Rate: 80ECG Heart Rate: 94 Temp: 98.5 F (36.9 C) Resp: 18 BP: 125/83 SpO2: 99 %  BMI: Estimated body mass index is 27.46 kg/m as calculated from the following:   Height as of this encounter: 5\' 5"  (1.651 m).   Weight as of this encounter: 165 lb (74.8 kg).  Risk Assessment: Allergies: Reviewed. She is allergic to contrast media [iodinated diagnostic agents]; gabapentin; labetalol; sulfabenzamide; fentanyl; and penicillins.  Allergy Precautions: None required Coagulopathies: Reviewed. None identified.  Blood-thinner therapy: None at this time Active Infection(s): Reviewed. None identified. [name] is afebrile  Site Confirmation: Ms. Ketter was asked to confirm the procedure and laterality before marking the site Procedure checklist: Completed Consent: Before the procedure and under the influence of no sedative(s), amnesic(s), or anxiolytics, the patient was informed of the treatment options, risks and possible complications. To fulfill our ethical and legal obligations, as recommended by the American Medical Association's Code of Ethics, I have informed the patient of my clinical impression; the nature and purpose of the treatment or procedure; the risks, benefits, and possible complications of the intervention;  the alternatives, including doing nothing; the risk(s) and benefit(s) of the alternative treatment(s) or procedure(s); and the risk(s) and benefit(s) of doing  nothing. The patient was provided information about the general risks and possible complications associated with the procedure. These may include, but are not limited to: failure to achieve desired goals, infection, bleeding, organ or nerve damage, allergic reactions, paralysis, and death. In addition, the patient was informed of those risks and complications associated to Spine-related procedures, such as failure to decrease pain; infection (i.e.: Meningitis, epidural or intraspinal abscess); bleeding (i.e.: epidural hematoma, subarachnoid hemorrhage, or any other type of intraspinal or peri-dural bleeding); organ or nerve damage (i.e.: Any type of peripheral nerve, nerve root, or spinal cord injury) with subsequent damage to sensory, motor, and/or autonomic systems, resulting in permanent pain, numbness, and/or weakness of one or several areas of the body; allergic reactions; (i.e.: anaphylactic reaction); and/or death. Furthermore, the patient was informed of those risks and complications associated with the medications. These include, but are not limited to: allergic reactions (i.e.: anaphylactic or anaphylactoid reaction(s)); adrenal axis suppression; blood sugar elevation that in diabetics may result in ketoacidosis or comma; water retention that in patients with history of congestive heart failure may result in shortness of breath, pulmonary edema, and decompensation with resultant heart failure; weight gain; swelling or edema; medication-induced neural toxicity; particulate matter embolism and blood vessel occlusion with resultant organ, and/or nervous system infarction; and/or aseptic necrosis of one or more joints. Finally, the patient was informed that Medicine is not an exact science; therefore, there is also the possibility of unforeseen or  unpredictable risks and/or possible complications that may result in a catastrophic outcome. The patient indicated having understood very clearly. We have given the patient no guarantees and we have made no promises. Enough time was given to the patient to ask questions, all of which were answered to the patient's satisfaction. Ms. Wonser has indicated that she wanted to continue with the procedure. Attestation: I, the ordering provider, attest that I have discussed with the patient the benefits, risks, side-effects, alternatives, likelihood of achieving goals, and potential problems during recovery for the procedure that I have provided informed consent. Date  Time: 08/22/2018  9:02 AM  Pre-Procedure Preparation:  Monitoring: As per clinic protocol. Respiration, ETCO2, SpO2, BP, heart rate and rhythm monitor placed and checked for adequate function Safety Precautions: Patient was assessed for positional comfort and pressure points before starting the procedure. Time-out: I initiated and conducted the "Time-out" before starting the procedure, as per protocol. The patient was asked to participate by confirming the accuracy of the "Time Out" information. Verification of the correct person, site, and procedure were performed and confirmed by me, the nursing staff, and the patient. "Time-out" conducted as per Joint Commission's Universal Protocol (UP.01.01.01). Time: 0953  Description of Procedure:          Position: Prone Laterality: Bilateral. The procedure was performed in identical fashion on both sides. Levels:  L2, L3, L4, L5, & S1 Medial Branch Level(s) Area Prepped: Posterior Lumbosacral Region Prepping solution: ChloraPrep (2% chlorhexidine gluconate and 70% isopropyl alcohol) Safety Precautions: Aspiration looking for blood return was conducted prior to all injections. At no point did we inject any substances, as a needle was being advanced. Before injecting, the patient was told to immediately  notify me if she was experiencing any new onset of "ringing in the ears, or metallic taste in the mouth". No attempts were made at seeking any paresthesias. Safe injection practices and needle disposal techniques used. Medications properly checked for expiration dates. SDV (single dose vial) medications used. After the completion of the procedure, all  disposable equipment used was discarded in the proper designated medical waste containers. Local Anesthesia: Protocol guidelines were followed. The patient was positioned over the fluoroscopy table. The area was prepped in the usual manner. The time-out was completed. The target area was identified using fluoroscopy. A 12-in long, straight, sterile hemostat was used with fluoroscopic guidance to locate the targets for each level blocked. Once located, the skin was marked with an approved surgical skin marker. Once all sites were marked, the skin (epidermis, dermis, and hypodermis), as well as deeper tissues (fat, connective tissue and muscle) were infiltrated with a small amount of a short-acting local anesthetic, loaded on a 10cc syringe with a 25G, 1.5-in  Needle. An appropriate amount of time was allowed for local anesthetics to take effect before proceeding to the next step. Local Anesthetic: Lidocaine 2.0% The unused portion of the local anesthetic was discarded in the proper designated containers. Technical explanation of process:  L2 Medial Branch Nerve Block (MBB): The target area for the L2 medial branch is at the junction of the postero-lateral aspect of the superior articular process and the superior, posterior, and medial edge of the transverse process of L3. Under fluoroscopic guidance, a Quincke needle was inserted until contact was made with os over the superior postero-lateral aspect of the pedicular shadow (target area). After negative aspiration for blood, 0.5 mL of the nerve block solution was injected without difficulty or complication. The  needle was removed intact. L3 Medial Branch Nerve Block (MBB): The target area for the L3 medial branch is at the junction of the postero-lateral aspect of the superior articular process and the superior, posterior, and medial edge of the transverse process of L4. Under fluoroscopic guidance, a Quincke needle was inserted until contact was made with os over the superior postero-lateral aspect of the pedicular shadow (target area). After negative aspiration for blood, 0.5 mL of the nerve block solution was injected without difficulty or complication. The needle was removed intact. L4 Medial Branch Nerve Block (MBB): The target area for the L4 medial branch is at the junction of the postero-lateral aspect of the superior articular process and the superior, posterior, and medial edge of the transverse process of L5. Under fluoroscopic guidance, a Quincke needle was inserted until contact was made with os over the superior postero-lateral aspect of the pedicular shadow (target area). After negative aspiration for blood, 0.5 mL of the nerve block solution was injected without difficulty or complication. The needle was removed intact. L5 Medial Branch Nerve Block (MBB): The target area for the L5 medial branch is at the junction of the postero-lateral aspect of the superior articular process and the superior, posterior, and medial edge of the sacral ala. Under fluoroscopic guidance, a Quincke needle was inserted until contact was made with os over the superior postero-lateral aspect of the pedicular shadow (target area). After negative aspiration for blood, 0.5 mL of the nerve block solution was injected without difficulty or complication. The needle was removed intact. S1 Medial Branch Nerve Block (MBB): The target area for the S1 medial branch is at the posterior and inferior 6 o'clock position of the L5-S1 facet joint. Under fluoroscopic guidance, the Quincke needle inserted for the L5 MBB was redirected until  contact was made with os over the inferior and postero aspect of the sacrum, at the 6 o' clock position under the L5-S1 facet joint (Target area). After negative aspiration for blood, 0.5 mL of the nerve block solution was injected without difficulty or complication.  The needle was removed intact. Procedural Needles: 22-gauge, 3.5-inch, Quincke needles used for all levels. Nerve block solution: 0.2% PF-Ropivacaine + Triamcinolone (40 mg/mL) diluted to a final concentration of 4 mg of Triamcinolone/mL of Ropivacaine The unused portion of the solution was discarded in the proper designated containers.  Once the entire procedure was completed, the treated area was cleaned, making sure to leave some of the prepping solution back to take advantage of its long term bactericidal properties.   Illustration of the posterior view of the lumbar spine and the posterior neural structures. Laminae of L2 through S1 are labeled. DPRL5, dorsal primary ramus of L5; DPRS1, dorsal primary ramus of S1; DPR3, dorsal primary ramus of L3; FJ, facet (zygapophyseal) joint L3-L4; I, inferior articular process of L4; LB1, lateral branch of dorsal primary ramus of L1; IAB, inferior articular branches from L3 medial branch (supplies L4-L5 facet joint); IBP, intermediate branch plexus; MB3, medial branch of dorsal primary ramus of L3; NR3, third lumbar nerve root; S, superior articular process of L5; SAB, superior articular branches from L4 (supplies L4-5 facet joint also); TP3, transverse process of L3.  Vitals:   08/22/18 1005 08/22/18 1015 08/22/18 1025 08/22/18 1035  BP: 132/88 (!) 140/103 (!) 152/90 140/84  Pulse: 84     Resp: 18 (!) 23 (!) 25 20  Temp:  (!) 96.8 F (36 C)  (!) 96.5 F (35.8 C)  SpO2:  98% 98% 98%  Weight:      Height:        Start Time: 0953 hrs. End Time: 1005 hrs.  Imaging Guidance (Spinal):          Type of Imaging Technique: Fluoroscopy Guidance (Spinal) Indication(s): Assistance in needle  guidance and placement for procedures requiring needle placement in or near specific anatomical locations not easily accessible without such assistance. Exposure Time: Please see nurses notes. Contrast: None used. Fluoroscopic Guidance: I was personally present during the use of fluoroscopy. "Tunnel Vision Technique" used to obtain the best possible view of the target area. Parallax error corrected before commencing the procedure. "Direction-depth-direction" technique used to introduce the needle under continuous pulsed fluoroscopy. Once target was reached, antero-posterior, oblique, and lateral fluoroscopic projection used confirm needle placement in all planes. Images permanently stored in EMR. Interpretation: No contrast injected. I personally interpreted the imaging intraoperatively. Adequate needle placement confirmed in multiple planes. Permanent images saved into the patient's record.  Antibiotic Prophylaxis:   Anti-infectives (From admission, onward)   None     Indication(s): None identified  Post-operative Assessment:  Post-procedure Vital Signs:  Pulse/HCG Rate: 8498 Temp: (!) 96.5 F (35.8 C) Resp: 20 BP: 140/84 SpO2: 98 %  EBL: None  Complications: No immediate post-treatment complications observed by team, or reported by patient.  Note: The patient tolerated the entire procedure well. A repeat set of vitals were taken after the procedure and the patient was kept under observation following institutional policy, for this type of procedure. Post-procedural neurological assessment was performed, showing return to baseline, prior to discharge. The patient was provided with post-procedure discharge instructions, including a section on how to identify potential problems. Should any problems arise concerning this procedure, the patient was given instructions to immediately contact us, at any time, without hesitation. In any case, we plan to contact the patient by telephone for a  follow-up status report regarding this interventional procedure.  Comments:  No additional relevant information.  Plan of Care   Possible POC:  Diagnostic right-sided intra-articular shoulder joint injection #1  under fluoroscopic guidance, with or without sedation, depending on the patient's preference.   Imaging Orders     DG C-Arm 1-60 Min-No Report  Procedure Orders     LUMBAR FACET(MEDIAL BRANCH NERVE BLOCK) MBNB (Today)     SHOULDER INJECTION (Scheduled in 2 weeks)  Medications ordered for procedure: Meds ordered this encounter  Medications  . tiZANidine (ZANAFLEX) 4 MG tablet    Sig: Take 1 tablet (4 mg total) by mouth 3 (three) times daily.    Dispense:  90 tablet    Refill:  1    Do not place medication on "Automatic Refill". Fill one day early if pharmacy is closed on scheduled refill date.  . lidocaine (XYLOCAINE) 2 % (with pres) injection 400 mg  . midazolam (VERSED) 5 MG/5ML injection 1-2 mg    Make sure Flumazenil is available in the pyxis when using this medication. If oversedation occurs, administer 0.2 mg IV over 15 sec. If after 45 sec no response, administer 0.2 mg again over 1 min; may repeat at 1 min intervals; not to exceed 4 doses (1 mg)  . lactated ringers infusion 1,000 mL  . ropivacaine (PF) 2 mg/mL (0.2%) (NAROPIN) injection 18 mL  . triamcinolone acetonide (KENALOG-40) injection 80 mg  . HYDROcodone-acetaminophen (NORCO) 10-325 MG tablet    Sig: Take 1 tablet by mouth every 8 (eight) hours as needed for severe pain.    Dispense:  90 tablet    Refill:  0    Medication for Chronic Pain (G89.4). [place] STOP ACT - Not applicable. Fill one day early if pharmacy is closed on scheduled refill date.  Do not fill until: 08/22/18 To last until: 09/21/18   Medications administered: We administered lidocaine, midazolam, lactated ringers, ropivacaine (PF) 2 mg/mL (0.2%), and triamcinolone acetonide.  See the medical record for exact dosing, route, and time of  administration.  New Prescriptions   No medications on file   Disposition: Discharge home  Discharge Date & Time: 08/22/2018; 1036 hrs.   Physician-requested Follow-up: Return for PPE (2 wks) + Procedure (w/ sedation): (R) Shoulder inj..  Future Appointments  Date Time Provider Department Center  08/28/2018 10:00 AM [facility]  08/28/2018 11:00 AM [facility]  09/05/2018  9:15 AM Delano Metz, MD [facility] None  09/19/2018 11:00 AM Barbette Merino, NP [facility] None   Primary Care Physician: Lauro Regulus, MD Location: [facility] Note by: Oswaldo Done, MD Date: 08/22/2018; Time: 11:00 AM  Disclaimer:  Medicine is not an Visual merchandiser. The only guarantee in medicine is that nothing is guaranteed. It is important to note that the decision to proceed with this intervention was based on the information collected from the patient. The Data and conclusions were drawn from the patient's questionnaire, the interview, and the physical examination. Because the information was provided in large part by the patient, it cannot be guaranteed that it has not been purposely or unconsciously manipulated. Every effort has been made to obtain as much relevant data as possible for this evaluation. It is important to note that the conclusions that lead to this procedure are derived in large part from the available data. Always take into account that the treatment will also be dependent on availability of resources and existing treatment guidelines, considered by other Pain Management Practitioners as being common knowledge and practice, at the time of the intervention. For Medico-Legal purposes, it is also important to point out that variation in procedural  techniques and pharmacological choices are the acceptable norm. The indications, contraindications, technique, and results of the above procedure should only be interpreted and judged by a  Board-Certified Interventional Pain Specialist with extensive familiarity and expertise in the same exact procedure and technique.

## 2018-08-22 NOTE — Progress Notes (Signed)
Safety precautions to be maintained throughout the outpatient stay will include: orient to surroundings, keep bed in low position, maintain call bell within reach at all times, provide assistance with transfer out of bed and ambulation.  

## 2018-08-22 NOTE — Patient Instructions (Addendum)
____________________________________________________________________________________________  Post-Procedure Discharge Instructions  Instructions:  Apply ice: Fill a plastic sandwich bag with crushed ice. Cover it with a small towel and apply to injection site. Apply for 15 minutes then remove x 15 minutes. Repeat sequence on day of procedure, until you go to bed. The purpose is to minimize swelling and discomfort after procedure.  Apply heat: Apply heat to procedure site starting the day following the procedure. The purpose is to treat any soreness and discomfort from the procedure.  Food intake: Start with clear liquids (like water) and advance to regular food, as tolerated.   Physical activities: Keep activities to a minimum for the first 8 hours after the procedure.   Driving: If you have received any sedation, you are not allowed to drive for 24 hours after your procedure.  Blood thinner: Restart your blood thinner 6 hours after your procedure. (Only for those taking blood thinners)  Insulin: As soon as you can eat, you may resume your normal dosing schedule. (Only for those taking insulin)  Infection prevention: Keep procedure site clean and dry.  Post-procedure Pain Diary: Extremely important that this be done correctly and accurately. Recorded information will be used to determine the next step in treatment.  Pain evaluated is that of treated area only. Do not include pain from an untreated area.  Complete every hour, on the hour, for the initial 8 hours. Set an alarm to help you do this part accurately.  Do not go to sleep and have it completed later. It will not be accurate.  Follow-up appointment: Keep your follow-up appointment after the procedure. Usually 2 weeks for most procedures. (6 weeks in the case of radiofrequency.) Bring you pain diary.   Expect:  From numbing medicine (AKA: Local Anesthetics): Numbness or decrease in pain.  Onset: Full effect within 15  minutes of injected.  Duration: It will depend on the type of local anesthetic used. On the average, 1 to 8 hours.   From steroids: Decrease in swelling or inflammation. Once inflammation is improved, relief of the pain will follow.  Onset of benefits: Depends on the amount of swelling present. The more swelling, the longer it will take for the benefits to be seen. In some cases, up to 10 days.  Duration: Steroids will stay in the system x 2 weeks. Duration of benefits will depend on multiple posibilities including persistent irritating factors.  Occasional side-effects: Facial flushing, cramps (if present, drink Gatorade and take over-the-counter Magnesium 450-500 mg once to twice a day).  From procedure: Some discomfort is to be expected once the numbing medicine wears off. This should be minimal if ice and heat are applied as instructed.  Call if:  You experience numbness and weakness that gets worse with time, as opposed to wearing off.  New onset bowel or bladder incontinence. (This applies to Spinal procedures only)  Emergency Numbers:  Durning business hours (Monday - Thursday, 8:00 AM - 4:00 PM) (Friday, 9:00 AM - 12:00 Noon): [phone]  After hours: [phone] ____________________________________________________________________________________________   ____________________________________________________________________________________________  Preparing for Procedure with Sedation  Instructions: . Oral Intake: Do not eat or drink anything for at least 8 hours prior to your procedure. . Transportation: Public transportation is not allowed. Bring an adult driver. The driver must be physically present in our waiting room before any procedure can be started. Marland Kitchen Physical Assistance: Bring an adult physically capable of assisting you, in the event you need help. This adult should keep you company at  home for at least 6 hours after the procedure. . Blood Pressure  Medicine: Take your blood pressure medicine with a sip of water the morning of the procedure. . Blood thinners: Notify our staff if you are taking any blood thinners. Depending on which one you take, there will be specific instructions on how and when to stop it. . Diabetics on insulin: Notify the staff so that you can be scheduled 1st case in the morning. If your diabetes requires high dose insulin, take only  of your normal insulin dose the morning of the procedure and notify the staff that you have done so. . Preventing infections: Shower with an antibacterial soap the morning of your procedure. . Build-up your immune system: Take 1000 mg of Vitamin C with every meal (3 times a day) the day prior to your procedure. Marland Kitchen Antibiotics: Inform the staff if you have a condition or reason that requires you to take antibiotics before dental procedures. . Pregnancy: If you are pregnant, call and cancel the procedure. . Sickness: If you have a cold, fever, or any active infections, call and cancel the procedure. . Arrival: You must be in the facility at least 30 minutes prior to your scheduled procedure. . Children: Do not bring children with you. . Dress appropriately: Bring dark clothing that you would not mind if they get stained. . Valuables: Do not bring any jewelry or valuables.  Procedure appointments are reserved for interventional treatments only. Marland Kitchen No Prescription Refills. . No medication changes will be discussed during procedure appointments. . No disability issues will be discussed.  Reasons to call and reschedule or cancel your procedure: (Following these recommendations will minimize the risk of a serious complication.) . Surgeries: Avoid having procedures within 2 weeks of any surgery. (Avoid for 2 weeks before or after any surgery). . Flu Shots: Avoid having procedures within 2 weeks of a flu shots or . (Avoid for 2 weeks before or after immunizations). . Barium: Avoid having a  procedure within 7-10 days after having had a radiological study involving the use of radiological contrast. (Myelograms, Barium swallow or enema study). . Heart attacks: Avoid any elective procedures or surgeries for the initial 6 months after a "Myocardial Infarction" (Heart Attack). . Blood thinners: It is imperative that you stop these medications before procedures. Let us know if you if you take any blood thinner.  . Infection: Avoid procedures during or within two weeks of an infection (including chest colds or gastrointestinal problems). Symptoms associated with infections include: Localized redness, fever, chills, night sweats or profuse sweating, burning sensation when voiding, cough, congestion, stuffiness, runny nose, sore throat, diarrhea, nausea, vomiting, cold or Flu symptoms, recent or current infections. It is specially important if the infection is over the area that we intend to treat. Marland Kitchen Heart and lung problems: Symptoms that may suggest an active cardiopulmonary problem include: cough, chest pain, breathing difficulties or shortness of breath, dizziness, ankle swelling, uncontrolled high or unusually low blood pressure, and/or palpitations. If you are experiencing any of these symptoms, cancel your procedure and contact your primary care physician for an evaluation.  Remember:  Regular Business hours are:  Monday to Thursday 8:00 AM to 4:00 PM  Provider's Schedule: Delano Metz, MD:  Procedure days: Tuesday and Thursday 7:30 AM to 4:00 PM  Edward Jolly, MD:  Procedure days: Monday and Wednesday 7:30 AM to 4:00 PM ____________________________________________________________________________________________   ____________________________________________________________________________________________  Pain Scale  Introduction: The pain score used by this practice is the  Verbal Numerical Rating Scale (VNRS-11). This is an 11-point scale. It is for adults and children 10  years or older. There are significant differences in how the pain score is reported, used, and applied. Forget everything you learned in the past and learn this scoring system.  General Information: The scale should reflect your current level of pain. Unless you are specifically asked for the level of your worst pain, or your average pain. If you are asked for one of these two, then it should be understood that it is over the past 24 hours.  Basic Activities of Daily Living (ADL): Personal hygiene, dressing, eating, transferring, and using restroom.  Instructions: Most patients tend to report their level of pain as a combination of two factors, their physical pain and their psychosocial pain. This last one is also known as "suffering" and it is reflection of how physical pain affects you socially and psychologically. From now on, report them separately. From this point on, when asked to report your pain level, report only your physical pain. Use the following table for reference.  Pain Clinic Pain Levels (0-5/10)  Pain Level Score  Description  No Pain 0   Mild pain 1 Nagging, annoying, but does not interfere with basic activities of daily living (ADL). Patients are able to eat, bathe, get dressed, toileting (being able to get on and off the toilet and perform personal hygiene functions), transfer (move in and out of bed or a chair without assistance), and maintain continence (able to control bladder and bowel functions). Blood pressure and heart rate are unaffected. A normal heart rate for a healthy adult ranges from 60 to 100 bpm (beats per minute).   Mild to moderate pain 2 Noticeable and distracting. Impossible to hide from other people. More frequent flare-ups. Still possible to adapt and function close to normal. It can be very annoying and may have occasional stronger flare-ups. With discipline, patients may get used to it and adapt.   Moderate pain 3 Interferes significantly with activities of  daily living (ADL). It becomes difficult to feed, bathe, get dressed, get on and off the toilet or to perform personal hygiene functions. Difficult to get in and out of bed or a chair without assistance. Very distracting. With effort, it can be ignored when deeply involved in activities.   Moderately severe pain 4 Impossible to ignore for more than a few minutes. With effort, patients may still be able to manage work or participate in some social activities. Very difficult to concentrate. Signs of autonomic nervous system discharge are evident: dilated pupils (mydriasis); mild sweating (diaphoresis); sleep interference. Heart rate becomes elevated (>115 bpm). Diastolic blood pressure (lower number) rises above 100 mmHg. Patients find relief in laying down and not moving.   Severe pain 5 Intense and extremely unpleasant. Associated with frowning face and frequent crying. Pain overwhelms the senses.  Ability to do any activity or maintain social relationships becomes significantly limited. Conversation becomes difficult. Pacing back and forth is common, as getting into a comfortable position is nearly impossible. Pain wakes you up from deep sleep. Physical signs will be obvious: pupillary dilation; increased sweating; goosebumps; brisk reflexes; cold, clammy hands and feet; nausea, vomiting or dry heaves; loss of appetite; significant sleep disturbance with inability to fall asleep or to remain asleep. When persistent, significant weight loss is observed due to the complete loss of appetite and sleep deprivation.  Blood pressure and heart rate becomes significantly elevated. Caution: If elevated blood pressure triggers a  pounding headache associated with blurred vision, then the patient should immediately seek attention at an urgent or emergency care unit, as these may be signs of an impending stroke.    Emergency Department Pain Levels (6-10/10)  Emergency Room Pain 6 Severely limiting. Requires emergency  care and should not be seen or managed at an outpatient pain management facility. Communication becomes difficult and requires great effort. Assistance to reach the emergency department may be required. Facial flushing and profuse sweating along with potentially dangerous increases in heart rate and blood pressure will be evident.   Distressing pain 7 Self-care is very difficult. Assistance is required to transport, or use restroom. Assistance to reach the emergency department will be required. Tasks requiring coordination, such as bathing and getting dressed become very difficult.   Disabling pain 8 Self-care is no longer possible. At this level, pain is disabling. The individual is unable to do even the most "basic" activities such as walking, eating, bathing, dressing, transferring to a bed, or toileting. Fine motor skills are lost. It is difficult to think clearly.   Incapacitating pain 9 Pain becomes incapacitating. Thought processing is no longer possible. Difficult to remember your own name. Control of movement and coordination are lost.   The worst pain imaginable 10 At this level, most patients pass out from pain. When this level is reached, collapse of the autonomic nervous system occurs, leading to a sudden drop in blood pressure and heart rate. This in turn results in a temporary and dramatic drop in blood flow to the brain, leading to a loss of consciousness. Fainting is one of the body's self defense mechanisms. Passing out puts the brain in a calmed state and causes it to shut down for a while, in order to begin the healing process.    Summary: 1. Refer to this scale when providing [place] with your pain level. 2. Be accurate and careful when reporting your pain level. This will help with your care. 3. Over-reporting your pain level will lead to loss of credibility. 4. Even a level of 1/10 means that there is pain and will be treated at our facility. 5. High, inaccurate reporting will be  documented as "Symptom Exaggeration", leading to loss of credibility and suspicions of possible secondary gains such as obtaining more narcotics, or wanting to appear disabled, for fraudulent reasons. 6. Only pain levels of 5 or below will be seen at our facility. 7. Pain levels of 6 and above will be sent to the Emergency Department and the appointment cancelled. ____________________________________________________________________________________________

## 2018-08-23 ENCOUNTER — Telehealth: Payer: Self-pay | Admitting: *Deleted

## 2018-08-23 NOTE — Telephone Encounter (Signed)
Attempted to call for post procedure follow-up. Message left. 

## 2018-08-26 ENCOUNTER — Encounter: Payer: Self-pay | Admitting: Nurse Practitioner

## 2018-08-28 ENCOUNTER — Ambulatory Visit
Admission: RE | Admit: 2018-08-28 | Discharge: 2018-08-28 | Disposition: A | Discharge status: Home / Self Care | Payer: Medicare Other | Source: Ambulatory Visit | Attending: Pain Medicine | Admitting: Pain Medicine

## 2018-08-28 DIAGNOSIS — X58XXXA Exposure to other specified factors, initial encounter: Secondary | ICD-10-CM | POA: Diagnosis not present

## 2018-08-28 DIAGNOSIS — M25561 Pain in right knee: Secondary | ICD-10-CM | POA: Diagnosis present

## 2018-08-28 DIAGNOSIS — M170 Bilateral primary osteoarthritis of knee: Secondary | ICD-10-CM

## 2018-08-28 DIAGNOSIS — G8929 Other chronic pain: Secondary | ICD-10-CM | POA: Diagnosis present

## 2018-08-28 DIAGNOSIS — S83282A Other tear of lateral meniscus, current injury, left knee, initial encounter: Secondary | ICD-10-CM | POA: Diagnosis not present

## 2018-08-28 DIAGNOSIS — M25562 Pain in left knee: Secondary | ICD-10-CM

## 2018-08-28 DIAGNOSIS — M659 Synovitis and tenosynovitis, unspecified: Secondary | ICD-10-CM | POA: Insufficient documentation

## 2018-09-05 ENCOUNTER — Ambulatory Visit: Payer: Self-pay | Admitting: Pain Medicine

## 2018-09-12 ENCOUNTER — Encounter: Payer: Self-pay | Admitting: Pain Medicine

## 2018-09-12 ENCOUNTER — Other Ambulatory Visit: Payer: Self-pay

## 2018-09-12 ENCOUNTER — Ambulatory Visit
Admission: RE | Admit: 2018-09-12 | Discharge: 2018-09-12 | Disposition: A | Discharge status: Home / Self Care | Payer: Medicare Other | Source: Ambulatory Visit | Attending: Pain Medicine | Admitting: Pain Medicine

## 2018-09-12 ENCOUNTER — Ambulatory Visit (HOSPITAL_BASED_OUTPATIENT_CLINIC_OR_DEPARTMENT_OTHER): Payer: Medicare Other | Admitting: Pain Medicine

## 2018-09-12 VITALS — BP 121/80 | HR 88 | Temp 97.7°F | Resp 20 | Ht 64.0 in | Wt 164.0 lb

## 2018-09-12 DIAGNOSIS — Z981 Arthrodesis status: Secondary | ICD-10-CM | POA: Diagnosis not present

## 2018-09-12 DIAGNOSIS — Z9889 Other specified postprocedural states: Secondary | ICD-10-CM | POA: Insufficient documentation

## 2018-09-12 DIAGNOSIS — Z9049 Acquired absence of other specified parts of digestive tract: Secondary | ICD-10-CM | POA: Insufficient documentation

## 2018-09-12 DIAGNOSIS — Z885 Allergy status to narcotic agent status: Secondary | ICD-10-CM | POA: Diagnosis not present

## 2018-09-12 DIAGNOSIS — M19011 Primary osteoarthritis, right shoulder: Secondary | ICD-10-CM | POA: Diagnosis present

## 2018-09-12 DIAGNOSIS — Z79891 Long term (current) use of opiate analgesic: Secondary | ICD-10-CM | POA: Diagnosis not present

## 2018-09-12 DIAGNOSIS — Z888 Allergy status to other drugs, medicaments and biological substances status: Secondary | ICD-10-CM | POA: Insufficient documentation

## 2018-09-12 DIAGNOSIS — G8929 Other chronic pain: Secondary | ICD-10-CM

## 2018-09-12 DIAGNOSIS — M25511 Pain in right shoulder: Secondary | ICD-10-CM

## 2018-09-12 DIAGNOSIS — M19012 Primary osteoarthritis, left shoulder: Secondary | ICD-10-CM

## 2018-09-12 DIAGNOSIS — Z79899 Other long term (current) drug therapy: Secondary | ICD-10-CM | POA: Insufficient documentation

## 2018-09-12 DIAGNOSIS — M25512 Pain in left shoulder: Secondary | ICD-10-CM

## 2018-09-12 DIAGNOSIS — Z880 Allergy status to penicillin: Secondary | ICD-10-CM | POA: Insufficient documentation

## 2018-09-12 MED ORDER — MIDAZOLAM HCL 5 MG/5ML IJ SOLN
1.0000 mg | INTRAMUSCULAR | Status: DC | PRN
  Administered 2018-09-12: 2 mg via INTRAVENOUS

## 2018-09-12 MED ORDER — LIDOCAINE HCL 2 % IJ SOLN
INTRAMUSCULAR | Status: AC
  Filled 2018-09-12: qty 20

## 2018-09-12 MED ORDER — METHYLPREDNISOLONE ACETATE 80 MG/ML IJ SUSP
80.0000 mg | Freq: Once | INTRAMUSCULAR | Status: AC
  Administered 2018-09-12: 80 mg via INTRA_ARTICULAR

## 2018-09-12 MED ORDER — LACTATED RINGERS IV SOLN
1000.0000 mL | Freq: Once | INTRAVENOUS | Status: AC
  Administered 2018-09-12: 1000 mL via INTRAVENOUS

## 2018-09-12 MED ORDER — ROPIVACAINE HCL 2 MG/ML IJ SOLN
INTRAMUSCULAR | Status: AC
  Filled 2018-09-12: qty 10

## 2018-09-12 MED ORDER — MIDAZOLAM HCL 5 MG/5ML IJ SOLN
INTRAMUSCULAR | Status: AC
  Filled 2018-09-12: qty 5

## 2018-09-12 MED ORDER — FENTANYL CITRATE (PF) 100 MCG/2ML IJ SOLN
INTRAMUSCULAR | Status: AC
  Filled 2018-09-12: qty 2

## 2018-09-12 MED ORDER — METHYLPREDNISOLONE ACETATE 80 MG/ML IJ SUSP
INTRAMUSCULAR | Status: AC
  Filled 2018-09-12: qty 1

## 2018-09-12 MED ORDER — LIDOCAINE HCL 2 % IJ SOLN
20.0000 mL | Freq: Once | INTRAMUSCULAR | Status: AC
  Administered 2018-09-12: 400 mg

## 2018-09-12 MED ORDER — ROPIVACAINE HCL 2 MG/ML IJ SOLN
9.0000 mL | Freq: Once | INTRAMUSCULAR | Status: AC
  Administered 2018-09-12: 9 mL via INTRA_ARTICULAR

## 2018-09-12 NOTE — Patient Instructions (Addendum)
____________________________________________________________________________________________  Post-Procedure Discharge Instructions  Instructions:  Apply ice: Fill a plastic sandwich bag with crushed ice. Cover it with a small towel and apply to injection site. Apply for 15 minutes then remove x 15 minutes. Repeat sequence on day of procedure, until you go to bed. The purpose is to minimize swelling and discomfort after procedure.  Apply heat: Apply heat to procedure site starting the day following the procedure. The purpose is to treat any soreness and discomfort from the procedure.  Food intake: Start with clear liquids (like water) and advance to regular food, as tolerated.   Physical activities: Keep activities to a minimum for the first 8 hours after the procedure.   Driving: If you have received any sedation, you are not allowed to drive for 24 hours after your procedure.  Blood thinner: Restart your blood thinner 6 hours after your procedure. (Only for those taking blood thinners)  Insulin: As soon as you can eat, you may resume your normal dosing schedule. (Only for those taking insulin)  Infection prevention: Keep procedure site clean and dry.  Post-procedure Pain Diary: Extremely important that this be done correctly and accurately. Recorded information will be used to determine the next step in treatment.  Pain evaluated is that of treated area only. Do not include pain from an untreated area.  Complete every hour, on the hour, for the initial 8 hours. Set an alarm to help you do this part accurately.  Do not go to sleep and have it completed later. It will not be accurate.  Follow-up appointment: Keep your follow-up appointment after the procedure. Usually 2 weeks for most procedures. (6 weeks in the case of radiofrequency.) Bring you pain diary.   Expect:  From numbing medicine (AKA: Local Anesthetics): Numbness or decrease in pain.  Onset: Full effect within 15  minutes of injected.  Duration: It will depend on the type of local anesthetic used. On the average, 1 to 8 hours.   From steroids: Decrease in swelling or inflammation. Once inflammation is improved, relief of the pain will follow.  Onset of benefits: Depends on the amount of swelling present. The more swelling, the longer it will take for the benefits to be seen. In some cases, up to 10 days.  Duration: Steroids will stay in the system x 2 weeks. Duration of benefits will depend on multiple posibilities including persistent irritating factors.  Occasional side-effects: Facial flushing, cramps (if present, drink Gatorade and take over-the-counter Magnesium 450-500 mg once to twice a day).  From procedure: Some discomfort is to be expected once the numbing medicine wears off. This should be minimal if ice and heat are applied as instructed.  Call if:  You experience numbness and weakness that gets worse with time, as opposed to wearing off.  New onset bowel or bladder incontinence. (This applies to Spinal procedures only)  Emergency Numbers:  Durning business hours (Monday - Thursday, 8:00 AM - 4:00 PM) (Friday, 9:00 AM - 12:00 Noon): [phone]  After hours: [phone] ____________________________________________________________________________________________   ____________________________________________________________________________________________  Pain Scale  Introduction: The pain score used by this practice is the Verbal Numerical Rating Scale (VNRS-11). This is an 11-point scale. It is for adults and children 10 years or older. There are significant differences in how the pain score is reported, used, and applied. Forget everything you learned in the past and learn this scoring system.  General Information: The scale should reflect your current level of pain. Unless you are specifically asked   for the level of your worst pain, or your average pain. If you are asked  for one of these two, then it should be understood that it is over the past 24 hours.  Basic Activities of Daily Living (ADL): Personal hygiene, dressing, eating, transferring, and using restroom.  Instructions: Most patients tend to report their level of pain as a combination of two factors, their physical pain and their psychosocial pain. This last one is also known as "suffering" and it is reflection of how physical pain affects you socially and psychologically. From now on, report them separately. From this point on, when asked to report your pain level, report only your physical pain. Use the following table for reference.  Pain Clinic Pain Levels (0-5/10)  Pain Level Score  Description  No Pain 0   Mild pain 1 Nagging, annoying, but does not interfere with basic activities of daily living (ADL). Patients are able to eat, bathe, get dressed, toileting (being able to get on and off the toilet and perform personal hygiene functions), transfer (move in and out of bed or a chair without assistance), and maintain continence (able to control bladder and bowel functions). Blood pressure and heart rate are unaffected. A normal heart rate for a healthy adult ranges from 60 to 100 bpm (beats per minute).   Mild to moderate pain 2 Noticeable and distracting. Impossible to hide from other people. More frequent flare-ups. Still possible to adapt and function close to normal. It can be very annoying and may have occasional stronger flare-ups. With discipline, patients may get used to it and adapt.   Moderate pain 3 Interferes significantly with activities of daily living (ADL). It becomes difficult to feed, bathe, get dressed, get on and off the toilet or to perform personal hygiene functions. Difficult to get in and out of bed or a chair without assistance. Very distracting. With effort, it can be ignored when deeply involved in activities.   Moderately severe pain 4 Impossible to ignore for more than a few  minutes. With effort, patients may still be able to manage work or participate in some social activities. Very difficult to concentrate. Signs of autonomic nervous system discharge are evident: dilated pupils (mydriasis); mild sweating (diaphoresis); sleep interference. Heart rate becomes elevated (>115 bpm). Diastolic blood pressure (lower number) rises above 100 mmHg. Patients find relief in laying down and not moving.   Severe pain 5 Intense and extremely unpleasant. Associated with frowning face and frequent crying. Pain overwhelms the senses.  Ability to do any activity or maintain social relationships becomes significantly limited. Conversation becomes difficult. Pacing back and forth is common, as getting into a comfortable position is nearly impossible. Pain wakes you up from deep sleep. Physical signs will be obvious: pupillary dilation; increased sweating; goosebumps; brisk reflexes; cold, clammy hands and feet; nausea, vomiting or dry heaves; loss of appetite; significant sleep disturbance with inability to fall asleep or to remain asleep. When persistent, significant weight loss is observed due to the complete loss of appetite and sleep deprivation.  Blood pressure and heart rate becomes significantly elevated. Caution: If elevated blood pressure triggers a pounding headache associated with blurred vision, then the patient should immediately seek attention at an urgent or emergency care unit, as these may be signs of an impending stroke.    Emergency Department Pain Levels (6-10/10)  Emergency Room Pain 6 Severely limiting. Requires emergency care and should not be seen or managed at an outpatient pain management facility. Communication becomes difficult   and requires great effort. Assistance to reach the emergency department may be required. Facial flushing and profuse sweating along with potentially dangerous increases in heart rate and blood pressure will be evident.   Distressing pain 7  Self-care is very difficult. Assistance is required to transport, or use restroom. Assistance to reach the emergency department will be required. Tasks requiring coordination, such as bathing and getting dressed become very difficult.   Disabling pain 8 Self-care is no longer possible. At this level, pain is disabling. The individual is unable to do even the most "basic" activities such as walking, eating, bathing, dressing, transferring to a bed, or toileting. Fine motor skills are lost. It is difficult to think clearly.   Incapacitating pain 9 Pain becomes incapacitating. Thought processing is no longer possible. Difficult to remember your own name. Control of movement and coordination are lost.   The worst pain imaginable 10 At this level, most patients pass out from pain. When this level is reached, collapse of the autonomic nervous system occurs, leading to a sudden drop in blood pressure and heart rate. This in turn results in a temporary and dramatic drop in blood flow to the brain, leading to a loss of consciousness. Fainting is one of the body's self defense mechanisms. Passing out puts the brain in a calmed state and causes it to shut down for a while, in order to begin the healing process.    Summary: 1. Refer to this scale when providing us with your pain level. 2. Be accurate and careful when reporting your pain level. This will help with your care. 3. Over-reporting your pain level will lead to loss of credibility. 4. Even a level of 1/10 means that there is pain and will be treated at our facility. 5. High, inaccurate reporting will be documented as "Symptom Exaggeration", leading to loss of credibility and suspicions of possible secondary gains such as obtaining more narcotics, or wanting to appear disabled, for fraudulent reasons. 6. Only pain levels of 5 or below will be seen at our facility. 7. Pain levels of 6 and above will be sent to the Emergency Department and the appointment  cancelled. ____________________________________________________________________________________________    

## 2018-09-12 NOTE — Progress Notes (Signed)
Patient's Name: Jean Gomez  MRN: [id number]  Referring Provider: Lauro Regulus, MD  DOB: 1956/05/16  PCP: Sherron Monday, MD  DOS: 09/12/2018  Note by: Oswaldo Done, MD  Service setting: Ambulatory outpatient  Specialty: Interventional Pain Management  Patient type: Established  Location: [facility] (AMB) Pain Management Facility  Visit type: Interventional Procedure   Primary Reason for Visit: Interventional Pain Management Treatment. CC: Shoulder Pain (bilateral)  Procedure:          Anesthesia, Analgesia, Anxiolysis:  Type: Diagnostic Glenohumeral Joint (shoulder) Injection #1  CPT: 20610      Primary Purpose: Diagnostic Region: Superior Shoulder Area Level:  Shoulder Target Area: Glenohumeral Joint (shoulder) Approach: Anterior approach. Laterality: Bilateral  Type: Moderate (Conscious) Sedation combined with Local Anesthesia Indication(s): Analgesia and Anxiety Route: Intravenous (IV) IV Access: Secured Sedation: Meaningful verbal contact was maintained at all times during the procedure  Local Anesthetic: Lidocaine 1-2%  Position: Supine   Indications: 1. Osteoarthritis of shoulder (Bilateral)   2. Chronic shoulder pain (Fifth Area of Pain) (Bilateral) (R>L)    Pain Score: Pre-procedure: 9 /10 Post-procedure: 0-No pain/10  Pre-op Assessment:  Jean Gomez is a 62 y.o. (year old), female patient, seen today for interventional treatment. She  has a past surgical history that includes Cholecystectomy; Abdominal hysterectomy; and Spinal fusion. Jean Gomez has a current medication list which includes the following prescription(s): albuterol sulfate, amlodipine, aspirin ec, atorvastatin, betamethasone (augmented), clotrimazole-betamethasone, escitalopram, hydrochlorothiazide, hydrocodone-acetaminophen, ipratropium, levetiracetam, losartan, nitroglycerin, pantoprazole, potassium chloride sa, sucralfate, tizanidine, torsemide, diazepam, hydrocodone-acetaminophen,  levetiracetam, trazodone, and venlafaxine, and the following Facility-Administered Medications: lactated ringers and midazolam. Her primarily concern today is the Shoulder Pain (bilateral)  Initial Vital Signs:  Pulse/HCG Rate: 98ECG Heart Rate: 83 Temp: 97.7 F (36.5 C) Resp: 16 BP: (!) 136/102 SpO2: 100 %  BMI: Estimated body mass index is 28.15 kg/m as calculated from the following:   Height as of this encounter: 5\' 4"  (1.626 m).   Weight as of this encounter: 164 lb (74.4 kg).  Risk Assessment: Allergies: Reviewed. She is allergic to contrast media [iodinated diagnostic agents]; gabapentin; labetalol; sulfabenzamide; fentanyl; and penicillins.  Allergy Precautions: No fentanyl ordered.  No contrast ordered. Coagulopathies: Reviewed. None identified.  Blood-thinner therapy: None at this time Active Infection(s): Reviewed. None identified. Ms. Rocks is afebrile  Site Confirmation: Jean Gomez was asked to confirm the procedure and laterality before marking the site Procedure checklist: Completed Consent: Before the procedure and under the influence of no sedative(s), amnesic(s), or anxiolytics, the patient was informed of the treatment options, risks and possible complications. To fulfill our ethical and legal obligations, as recommended by the American Medical Association's Code of Ethics, I have informed the patient of my clinical impression; the nature and purpose of the treatment or procedure; the risks, benefits, and possible complications of the intervention; the alternatives, including doing nothing; the risk(s) and benefit(s) of the alternative treatment(s) or procedure(s); and the risk(s) and benefit(s) of doing nothing. The patient was provided information about the general risks and possible complications associated with the procedure. These may include, but are not limited to: failure to achieve desired goals, infection, bleeding, organ or nerve damage, allergic reactions,  paralysis, and death. In addition, the patient was informed of those risks and complications associated to the procedure, such as failure to decrease pain; infection; bleeding; organ or nerve damage with subsequent damage to sensory, motor, and/or autonomic systems, resulting in permanent pain, numbness, and/or weakness of one or several areas  of the body; allergic reactions; (i.e.: anaphylactic reaction); and/or death. Furthermore, the patient was informed of those risks and complications associated with the medications. These include, but are not limited to: allergic reactions (i.e.: anaphylactic or anaphylactoid reaction(s)); adrenal axis suppression; blood sugar elevation that in diabetics may result in ketoacidosis or comma; water retention that in patients with history of congestive heart failure may result in shortness of breath, pulmonary edema, and decompensation with resultant heart failure; weight gain; swelling or edema; medication-induced neural toxicity; particulate matter embolism and blood vessel occlusion with resultant organ, and/or nervous system infarction; and/or aseptic necrosis of one or more joints. Finally, the patient was informed that Medicine is not an exact science; therefore, there is also the possibility of unforeseen or unpredictable risks and/or possible complications that may result in a catastrophic outcome. The patient indicated having understood very clearly. We have given the patient no guarantees and we have made no promises. Enough time was given to the patient to ask questions, all of which were answered to the patient's satisfaction. Jean Gomez has indicated that she wanted to continue with the procedure. Attestation: I, the ordering provider, attest that I have discussed with the patient the benefits, risks, side-effects, alternatives, likelihood of achieving goals, and potential problems during recovery for the procedure that I have provided informed consent. Date   Time: 09/12/2018  9:02 AM  Pre-Procedure Preparation:  Monitoring: As per clinic protocol. Respiration, ETCO2, SpO2, BP, heart rate and rhythm monitor placed and checked for adequate function Safety Precautions: Patient was assessed for positional comfort and pressure points before starting the procedure. Time-out: I initiated and conducted the "Time-out" before starting the procedure, as per protocol. The patient was asked to participate by confirming the accuracy of the "Time Out" information. Verification of the correct person, site, and procedure were performed and confirmed by me, the nursing staff, and the patient. "Time-out" conducted as per Joint Commission's Universal Protocol (UP.01.01.01). Time: 0940  Description of Procedure:          Area Prepped: Entire shoulder Area Prepping solution: ChloraPrep (2% chlorhexidine gluconate and 70% isopropyl alcohol) Safety Precautions: Aspiration looking for blood return was conducted prior to all injections. At no point did we inject any substances, as a needle was being advanced. No attempts were made at seeking any paresthesias. Safe injection practices and needle disposal techniques used. Medications properly checked for expiration dates. SDV (single dose vial) medications used. Description of the Procedure: Protocol guidelines were followed. The patient was placed in position over the procedure table. The target area was identified and the area prepped in the usual manner. Skin & deeper tissues infiltrated with local anesthetic. Appropriate amount of time allowed to pass for local anesthetics to take effect. The procedure needles were then advanced to the target area. Proper needle placement secured. Negative aspiration confirmed. Solution injected in intermittent fashion, asking for systemic symptoms every 0.5cc of injectate. The needles were then removed and the area cleansed, making sure to leave some of the prepping solution back to take advantage  of its long term bactericidal properties.    Vitals:   09/12/18 0949 09/12/18 0955 09/12/18 1005 09/12/18 1015  BP: 116/82 (!) 112/93 (!) 128/95 121/80  Pulse: 82 80 88 88  Resp: 16 14 18 20   Temp:  97.6 F (36.4 C)  97.7 F (36.5 C)  SpO2: 100% 95% 100% 100%  Weight:      Height:        Start Time: 0940 hrs.  End Time: 0947 hrs. Materials:  Needle(s) Type: Spinal Needle Gauge: 22G Length: 3.5-in Medication(s): Please see orders for medications and dosing details.  Imaging Guidance (Non-Spinal):          Type of Imaging Technique: Fluoroscopy Guidance (Non-Spinal) Indication(s): Assistance in needle guidance and placement for procedures requiring needle placement in or near specific anatomical locations not easily accessible without such assistance. Exposure Time: Please see nurses notes. Contrast: None used. Fluoroscopic Guidance: I was personally present during the use of fluoroscopy. "Tunnel Vision Technique" used to obtain the best possible view of the target area. Parallax error corrected before commencing the procedure. "Direction-depth-direction" technique used to introduce the needle under continuous pulsed fluoroscopy. Once target was reached, antero-posterior, oblique, and lateral fluoroscopic projection used confirm needle placement in all planes. Images permanently stored in EMR. Interpretation: No contrast injected. I personally interpreted the imaging intraoperatively. Adequate needle placement confirmed in multiple planes. Permanent images saved into the patient's record.  Antibiotic Prophylaxis:   Anti-infectives (From admission, onward)   None     Indication(s): None identified  Post-operative Assessment:  Post-procedure Vital Signs:  Pulse/HCG Rate: 8883 Temp: 97.7 F (36.5 C) Resp: 20 BP: 121/80 SpO2: 100 %  EBL: None  Complications: No immediate post-treatment complications observed by team, or reported by patient.  Note: The patient tolerated  the entire procedure well. A repeat set of vitals were taken after the procedure and the patient was kept under observation following institutional policy, for this type of procedure. Post-procedural neurological assessment was performed, showing return to baseline, prior to discharge. The patient was provided with post-procedure discharge instructions, including a section on how to identify potential problems. Should any problems arise concerning this procedure, the patient was given instructions to immediately contact us, at any time, without hesitation. In any case, we plan to contact the patient by telephone for a follow-up status report regarding this interventional procedure.  Comments:  No additional relevant information.  Plan of Care   Imaging Orders     DG C-Arm 1-60 Min-No Report  Procedure Orders     SHOULDER INJECTION  Medications ordered for procedure: Meds ordered this encounter  Medications  . lidocaine (XYLOCAINE) 2 % (with pres) injection 400 mg  . ropivacaine (PF) 2 mg/mL (0.2%) (NAROPIN) injection 9 mL  . methylPREDNISolone acetate (DEPO-MEDROL) injection 80 mg  . midazolam (VERSED) 5 MG/5ML injection 1-2 mg    Make sure Flumazenil is available in the pyxis when using this medication. If oversedation occurs, administer 0.2 mg IV over 15 sec. If after 45 sec no response, administer 0.2 mg again over 1 min; may repeat at 1 min intervals; not to exceed 4 doses (1 mg)  . lactated ringers infusion 1,000 mL   Medications administered: We administered lidocaine, ropivacaine (PF) 2 mg/mL (0.2%), methylPREDNISolone acetate, midazolam, and lactated ringers.  See the medical record for exact dosing, route, and time of administration.  New Prescriptions   No medications on file   Disposition: Discharge home  Discharge Date & Time: 09/12/2018; 1020 hrs.   Physician-requested Follow-up: Return for post-procedure eval (2 wks), w/ Dr. Laban Emperor.  Future Appointments  Date Time  Provider Department Center  09/19/2018 11:00 AM Barbette Merino, NP [facility] None  09/30/2018  9:30 AM Delano Metz, MD [facility] None   Primary Care Physician: Sherron Monday, MD Location: [facility] Note by: Oswaldo Done, MD Date: 09/12/2018; Time: 10:20 AM  Disclaimer:  Medicine is not an Visual merchandiser.  The only guarantee in medicine is that nothing is guaranteed. It is important to note that the decision to proceed with this intervention was based on the information collected from the patient. The Data and conclusions were drawn from the patient's questionnaire, the interview, and the physical examination. Because the information was provided in large part by the patient, it cannot be guaranteed that it has not been purposely or unconsciously manipulated. Every effort has been made to obtain as much relevant data as possible for this evaluation. It is important to note that the conclusions that lead to this procedure are derived in large part from the available data. Always take into account that the treatment will also be dependent on availability of resources and existing treatment guidelines, considered by other Pain Management Practitioners as being common knowledge and practice, at the time of the intervention. For Medico-Legal purposes, it is also important to point out that variation in procedural techniques and pharmacological choices are the acceptable norm. The indications, contraindications, technique, and results of the above procedure should only be interpreted and judged by a Board-Certified Interventional Pain Specialist with extensive familiarity and expertise in the same exact procedure and technique.

## 2018-09-13 ENCOUNTER — Telehealth: Payer: Self-pay

## 2018-09-13 NOTE — Telephone Encounter (Signed)
Post procedure phone call.  LM 

## 2018-09-19 ENCOUNTER — Encounter: Payer: Self-pay | Admitting: Nurse Practitioner

## 2018-09-19 ENCOUNTER — Ambulatory Visit: Payer: Medicare Other | Attending: Nurse Practitioner | Admitting: Nurse Practitioner

## 2018-09-19 ENCOUNTER — Other Ambulatory Visit: Payer: Self-pay

## 2018-09-19 VITALS — BP 149/93 | HR 103 | Temp 98.4°F | Resp 16 | Ht 63.0 in | Wt 152.0 lb

## 2018-09-19 DIAGNOSIS — M19011 Primary osteoarthritis, right shoulder: Secondary | ICD-10-CM | POA: Insufficient documentation

## 2018-09-19 DIAGNOSIS — G4700 Insomnia, unspecified: Secondary | ICD-10-CM | POA: Insufficient documentation

## 2018-09-19 DIAGNOSIS — Z9049 Acquired absence of other specified parts of digestive tract: Secondary | ICD-10-CM | POA: Insufficient documentation

## 2018-09-19 DIAGNOSIS — M4726 Other spondylosis with radiculopathy, lumbar region: Secondary | ICD-10-CM | POA: Insufficient documentation

## 2018-09-19 DIAGNOSIS — D869 Sarcoidosis, unspecified: Secondary | ICD-10-CM | POA: Diagnosis not present

## 2018-09-19 DIAGNOSIS — M25561 Pain in right knee: Secondary | ICD-10-CM | POA: Diagnosis not present

## 2018-09-19 DIAGNOSIS — M4312 Spondylolisthesis, cervical region: Secondary | ICD-10-CM | POA: Insufficient documentation

## 2018-09-19 DIAGNOSIS — K219 Gastro-esophageal reflux disease without esophagitis: Secondary | ICD-10-CM | POA: Insufficient documentation

## 2018-09-19 DIAGNOSIS — M7918 Myalgia, other site: Secondary | ICD-10-CM | POA: Insufficient documentation

## 2018-09-19 DIAGNOSIS — G4733 Obstructive sleep apnea (adult) (pediatric): Secondary | ICD-10-CM | POA: Diagnosis not present

## 2018-09-19 DIAGNOSIS — J9610 Chronic respiratory failure, unspecified whether with hypoxia or hypercapnia: Secondary | ICD-10-CM | POA: Insufficient documentation

## 2018-09-19 DIAGNOSIS — M545 Low back pain: Secondary | ICD-10-CM | POA: Insufficient documentation

## 2018-09-19 DIAGNOSIS — M5441 Lumbago with sciatica, right side: Secondary | ICD-10-CM

## 2018-09-19 DIAGNOSIS — I10 Essential (primary) hypertension: Secondary | ICD-10-CM | POA: Insufficient documentation

## 2018-09-19 DIAGNOSIS — M25512 Pain in left shoulder: Secondary | ICD-10-CM | POA: Diagnosis not present

## 2018-09-19 DIAGNOSIS — M25511 Pain in right shoulder: Secondary | ICD-10-CM | POA: Insufficient documentation

## 2018-09-19 DIAGNOSIS — Z79899 Other long term (current) drug therapy: Secondary | ICD-10-CM | POA: Insufficient documentation

## 2018-09-19 DIAGNOSIS — Z880 Allergy status to penicillin: Secondary | ICD-10-CM | POA: Insufficient documentation

## 2018-09-19 DIAGNOSIS — Z7982 Long term (current) use of aspirin: Secondary | ICD-10-CM | POA: Insufficient documentation

## 2018-09-19 DIAGNOSIS — Z79891 Long term (current) use of opiate analgesic: Secondary | ICD-10-CM | POA: Diagnosis not present

## 2018-09-19 DIAGNOSIS — M5030 Other cervical disc degeneration, unspecified cervical region: Secondary | ICD-10-CM | POA: Insufficient documentation

## 2018-09-19 DIAGNOSIS — E559 Vitamin D deficiency, unspecified: Secondary | ICD-10-CM | POA: Diagnosis not present

## 2018-09-19 DIAGNOSIS — G894 Chronic pain syndrome: Secondary | ICD-10-CM | POA: Diagnosis not present

## 2018-09-19 DIAGNOSIS — K5900 Constipation, unspecified: Secondary | ICD-10-CM | POA: Insufficient documentation

## 2018-09-19 DIAGNOSIS — M19012 Primary osteoarthritis, left shoulder: Secondary | ICD-10-CM | POA: Diagnosis not present

## 2018-09-19 DIAGNOSIS — M47816 Spondylosis without myelopathy or radiculopathy, lumbar region: Secondary | ICD-10-CM | POA: Diagnosis not present

## 2018-09-19 DIAGNOSIS — G43909 Migraine, unspecified, not intractable, without status migrainosus: Secondary | ICD-10-CM | POA: Insufficient documentation

## 2018-09-19 DIAGNOSIS — K589 Irritable bowel syndrome without diarrhea: Secondary | ICD-10-CM | POA: Insufficient documentation

## 2018-09-19 DIAGNOSIS — M170 Bilateral primary osteoarthritis of knee: Secondary | ICD-10-CM | POA: Insufficient documentation

## 2018-09-19 DIAGNOSIS — Z888 Allergy status to other drugs, medicaments and biological substances status: Secondary | ICD-10-CM | POA: Insufficient documentation

## 2018-09-19 DIAGNOSIS — M4802 Spinal stenosis, cervical region: Secondary | ICD-10-CM | POA: Insufficient documentation

## 2018-09-19 DIAGNOSIS — G40909 Epilepsy, unspecified, not intractable, without status epilepticus: Secondary | ICD-10-CM | POA: Diagnosis not present

## 2018-09-19 DIAGNOSIS — G8929 Other chronic pain: Secondary | ICD-10-CM

## 2018-09-19 DIAGNOSIS — Z981 Arthrodesis status: Secondary | ICD-10-CM | POA: Insufficient documentation

## 2018-09-19 DIAGNOSIS — F411 Generalized anxiety disorder: Secondary | ICD-10-CM | POA: Diagnosis not present

## 2018-09-19 DIAGNOSIS — F319 Bipolar disorder, unspecified: Secondary | ICD-10-CM | POA: Insufficient documentation

## 2018-09-19 DIAGNOSIS — Z882 Allergy status to sulfonamides status: Secondary | ICD-10-CM | POA: Insufficient documentation

## 2018-09-19 DIAGNOSIS — E7800 Pure hypercholesterolemia, unspecified: Secondary | ICD-10-CM | POA: Insufficient documentation

## 2018-09-19 DIAGNOSIS — M5116 Intervertebral disc disorders with radiculopathy, lumbar region: Secondary | ICD-10-CM | POA: Insufficient documentation

## 2018-09-19 DIAGNOSIS — Z91041 Radiographic dye allergy status: Secondary | ICD-10-CM | POA: Insufficient documentation

## 2018-09-19 DIAGNOSIS — G250 Essential tremor: Secondary | ICD-10-CM | POA: Insufficient documentation

## 2018-09-19 DIAGNOSIS — Z90710 Acquired absence of both cervix and uterus: Secondary | ICD-10-CM | POA: Insufficient documentation

## 2018-09-19 DIAGNOSIS — J439 Emphysema, unspecified: Secondary | ICD-10-CM | POA: Insufficient documentation

## 2018-09-19 DIAGNOSIS — M25562 Pain in left knee: Secondary | ICD-10-CM | POA: Diagnosis not present

## 2018-09-19 MED ORDER — HYDROCODONE-ACETAMINOPHEN 10-325 MG PO TABS: 1.0000 | ORAL_TABLET | Freq: Three times a day (TID) | ORAL | 0 refills | Status: DC | PRN

## 2018-09-19 MED ORDER — TIZANIDINE HCL 4 MG PO TABS: 4.0000 mg | ORAL_TABLET | Freq: Three times a day (TID) | ORAL | 1 refills | Status: DC

## 2018-09-19 MED ORDER — DICLOFENAC SODIUM 1 % TD GEL: 2.0000 g | Freq: Four times a day (QID) | TRANSDERMAL | 1 refills | Status: DC

## 2018-09-19 MED ORDER — LIDOCAINE 5 % EX OINT: 1.0000 "application " | TOPICAL_OINTMENT | Freq: Four times a day (QID) | CUTANEOUS | 2 refills | Status: DC | PRN

## 2018-09-19 MED ORDER — HYDROCODONE-ACETAMINOPHEN 10-325 MG PO TABS: 1.0000 | ORAL_TABLET | Freq: Four times a day (QID) | ORAL | 0 refills | Status: DC | PRN

## 2018-09-19 NOTE — Patient Instructions (Addendum)
____________________________________________________________________________________________  Medication Rules  Applies to: All patients receiving prescriptions (written or electronic).  Pharmacy of record: Pharmacy where electronic prescriptions will be sent. If written prescriptions are taken to a different pharmacy, please inform the nursing staff. The pharmacy listed in the electronic medical record should be the one where you would like electronic prescriptions to be sent.  Prescription refills: Only during scheduled appointments. Applies to both, written and electronic prescriptions.  NOTE: The following applies primarily to controlled substances (Opioid* Pain Medications).   Patient's responsibilities: 1. Pain Pills: Bring all pain pills to every appointment (except for procedure appointments). 2. Pill Bottles: Bring pills in original pharmacy bottle. Always bring newest bottle. Bring bottle, even if empty. 3. Medication refills: You are responsible for knowing and keeping track of what medications you need refilled. The day before your appointment, write a list of all prescriptions that need to be refilled. Bring that list to your appointment and give it to the admitting nurse. Prescriptions will be written only during appointments. If you forget a medication, it will not be "Called in", "Faxed", or "electronically sent". You will need to get another appointment to get these prescribed. 4. Prescription Accuracy: You are responsible for carefully inspecting your prescriptions before leaving our office. Have the discharge nurse carefully go over each prescription with you, before taking them home. Make sure that your name is accurately spelled, that your address is correct. Check the name and dose of your medication to make sure it is accurate. Check the number of pills, and the written instructions to make sure they are clear and accurate. Make sure that you are given enough medication to last  until your next medication refill appointment. 5. Taking Medication: Take medication as prescribed. Never take more pills than instructed. Never take medication more frequently than prescribed. Taking less pills or less frequently is permitted and encouraged, when it comes to controlled substances (written prescriptions).  6. Inform other Doctors: Always inform, all of your healthcare providers, of all the medications you take. 7. Pain Medication from other Providers: You are not allowed to accept any additional pain medication from any other Doctor or Healthcare provider. There are two exceptions to this rule. (see below) In the event that you require additional pain medication, you are responsible for notifying us, as stated below. 8. Medication Agreement: You are responsible for carefully reading and following our Medication Agreement. This must be signed before receiving any prescriptions from our practice. Safely store a copy of your signed Agreement. Violations to the Agreement will result in no further prescriptions. (Additional copies of our Medication Agreement are available upon request.) 9. Laws, Rules, & Regulations: All patients are expected to follow all Federal and State Laws, Statutes, Rules, & Regulations. Ignorance of the Laws does not constitute a valid excuse. The use of any illegal substances is prohibited. 10. Adopted CDC guidelines & recommendations: Target dosing levels will be at or below 60 MME/day. Use of benzodiazepines** is not recommended.  Exceptions: There are only two exceptions to the rule of not receiving pain medications from other Healthcare Providers. 1. Exception #1 (Emergencies): In the event of an emergency (i.e.: accident requiring emergency care), you are allowed to receive additional pain medication. However, you are responsible for: As soon as you are able, call our office [phone], at any time of the day or night, and leave a message stating your name, the  date and nature of the emergency, and the name and dose of the medication   prescribed. In the event that your call is answered by a member of our staff, make sure to document and save the date, time, and the name of the person that took your information.  2. Exception #2 (Planned Surgery): In the event that you are scheduled by another doctor or dentist to have any type of surgery or procedure, you are allowed (for a period no longer than 30 days), to receive additional pain medication, for the acute post-op pain. However, in this case, you are responsible for picking up a copy of our "Post-op Pain Management for Surgeons" handout, and giving it to your surgeon or dentist. This document is available at our office, and does not require an appointment to obtain it. Simply go to our office during business hours (Monday-Thursday from 8:00 AM to 4:00 PM) (Friday 8:00 AM to 12:00 Noon) or if you have a scheduled appointment with [place], prior to your surgery, and ask for it by name. In addition, you will need to provide [place] with your name, name of your surgeon, type of surgery, and date of procedure or surgery.  *Opioid medications include: morphine, codeine, oxycodone, oxymorphone, hydrocodone, hydromorphone, meperidine, tramadol, tapentadol, buprenorphine, fentanyl, methadone. **Benzodiazepine medications include: diazepam (Valium), alprazolam (Xanax), clonazepam (Klonopine), lorazepam (Ativan), clorazepate (Tranxene), chlordiazepoxide (Librium), estazolam (Prosom), oxazepam (Serax), temazepam (Restoril), triazolam (Halcion) (Last updated: 02/14/2018) ____________________________________________________________________________________________   BMI Assessment: Estimated body mass index is 26.93 kg/m as calculated from the following:   Height as of this encounter: 5\' 3"  (1.6 m).   Weight as of this encounter: 152 lb (68.9 kg).  BMI interpretation table: BMI level Category Range association with higher incidence  of chronic pain  <18 kg/m2 Underweight   18.5-24.9 kg/m2 Ideal body weight   25-29.9 kg/m2 Overweight Increased incidence by 20%  30-34.9 kg/m2 Obese (Class I) Increased incidence by 68%  35-39.9 kg/m2 Severe obesity (Class II) Increased incidence by 136%  >40 kg/m2 Extreme obesity (Class III) Increased incidence by 254%   Patient's current BMI Ideal Body weight  Body mass index is 26.93 kg/m. Ideal body weight: 52.4 kg (115 lb 8.3 oz) Adjusted ideal body weight: 59 kg (130 lb 1.8 oz)   BMI Readings from Last 4 Encounters:  09/19/18 26.93 kg/m  09/12/18 28.15 kg/m  08/22/18 27.46 kg/m  08/07/18 28.67 kg/m   Wt Readings from Last 4 Encounters:  09/19/18 152 lb (68.9 kg)  09/12/18 164 lb (74.4 kg)  08/22/18 165 lb (74.8 kg)  08/07/18 167 lb (75.8 kg)   Scripts were all e-scribed.

## 2018-09-19 NOTE — Progress Notes (Signed)
Patient's Name: Jean Gomez  MRN: [id number]  Referring Provider: Kirk Ruths, MD  DOB: May 10, 1956  PCP: Jodi Marble, MD  DOS: 09/19/2018  Note by: Vevelyn Francois NP  Service setting: Ambulatory outpatient  Specialty: Interventional Pain Management  Location: [facility] (AMB) Pain Management Facility    Patient type: Established    Primary Reason(s) for Visit: Encounter for prescription drug management & post-procedure evaluation of chronic illness with mild to moderate exacerbation(Level of risk: moderate) CC: Back Pain (lower)  HPI  Jean Gomez is a 62 y.o. year old, female patient, who comes today for a post-procedure evaluation and medication management. She has Chronic pain syndrome; Pharmacologic therapy; Disorder of skeletal system; Problems influencing health status; Long term current use of opiate analgesic; Chronic low back pain (Bilateral) with right-sided sciatica (Secondary Area of Pain) (R>L); Chronic lower extremity pain (Tertiary Area of Pain) (Bilateral) (R>L); Chronic knee pain (Fourth Area of Pain) (Bilateral) (R>L); Chronic shoulder pain (Fifth Area of Pain) (Bilateral) (R>L); Occipital neuralgia (Primary Area of Pain) (Bilateral) (R>L); Chronic neck pain (Bilateral) (R>L); Chronic sacroiliac joint pain (Right); Chronic migraine without aura; Chronic respiratory failure with hypoxia ([place]); Constipation; Epilepsy ([place]); HTN, goal below 140/90; Essential tremor; Generalized anxiety disorder; H/O aneurysm; GERD (gastroesophageal reflux disease); History of depression; History of seizure disorder; Hypercholesterolemia; Insomnia; Irritable bowel syndrome; Major depression in remission ([place]); Muscle spasm; Obstructive sleep apnea on CPAP; Osteoarthritis of knee (Bilateral) (R>L); COPD (chronic obstructive pulmonary disease) ([place]); Rotator cuff injury; Sarcoidosis; Spondylosis without myelopathy or radiculopathy, lumbosacral region; Vitamin D deficiency; Chronic shoulder pain  (Right); Nonruptured cerebral aneurysm; Chronic headache disorder (Primary Area of Pain); Pulmonary emphysema ([place]); Seizure disorder ([place]); Hiatal hernia; Occipital headache (Right); Frontal headache (Left); Chronic low back pain (Midline) (Secondary Area of Pain); Chronic lumbar radiculopathy (Right); Osteoarthritis involving multiple joints; Osteoarthritis of shoulder (Bilateral); Chronic musculoskeletal pain; Lumbar facet joint syndrome (Bilateral) (R>L); Chronic hip pain (Tertiary Area of Pain) (Bilateral) (R>L); DDD (degenerative disc disease), cervical; Cervical facet arthropathy (Bilateral); Cervical facet syndrome (Bilateral) (R>L); Cervical Grade 1 Anterolisthesis of C4 over C5 (Degenerative); Cervical Grade 1 Retrolisthesis of C5 over C6; Cervical foraminal stenosis (C3-4, C4-5, and C5-6) (Bilateral); DDD (degenerative disc disease), lumbar; Osteoarthritis of lumbar spine; Osteoarthritis of facet joint of lumbar spine; Spondylosis without myelopathy or radiculopathy, cervicothoracic region; Bipolar affective disorder, current episode manic ([place]); History of allergy to radiographic contrast media; and Lumbar spondylosis on their problem list. Her primarily concern today is the Back Pain (lower)  Pain Assessment: Location: Lower Back Radiating: both legs to the feet Onset: More than a month ago Duration: Chronic pain Quality: Sharp Severity: 9 /10 (subjective, self-reported pain score)  Note: Reported level is compatible with observation. Clinically the patient looks like a 2/10 A 2/10 is viewed as "Mild to Moderate" and described as noticeable and distracting. Impossible to hide from other people. More frequent flare-ups. Still possible to adapt and function close to normal. It can be very annoying and may have occasional stronger flare-ups. With discipline, patients may get used to it and adapt. Jean Gomez does not seem to understand the use of our objective pain scale When using our objective  Pain Scale, levels between 6 and 10/10 are said to belong in an emergency room, as it progressively worsens from a 6/10, described as severely limiting, requiring emergency care not usually available at an outpatient pain management facility. At a 6/10 level, communication becomes difficult and requires great effort. Assistance to reach the emergency department  may be required. Facial flushing and profuse sweating along with potentially dangerous increases in heart rate and blood pressure will be evident. Effect on ADL:   Timing: Constant Modifying factors: medications BP: (!) 149/93  HR: (!) 103  Jean Gomez was last seen on 09/13/2018 for a procedure. During today's appointment we reviewed Jean Gomez's post-procedure results, as well as her outpatient medication regimen.  She admits that she is having good relief with her bilateral shoulder injections.  She has increased range of motion and decreased function.  She does have some unwanted weight loss secondary to stressing about her son.  She admits that overall she is doing okay.  Further details on both, my assessment(s), as well as the proposed treatment plan, please see below.  Controlled Substance Pharmacotherapy Assessment REMS (Risk Evaluation and Mitigation Strategy)  Analgesic:Hydrocodone/acetaminophen 10/325 3 times daily (fill date 11/19/2017) hydrocodone 30 mg per day. MME/day:'30mg'$ /day Landis Martins, RN  09/19/2018  9:51 AM  Sign at close encounter Nursing Pain Medication Assessment:  Safety precautions to be maintained throughout the outpatient stay will include: orient to surroundings, keep bed in low position, maintain call bell within reach at all times, provide assistance with transfer out of bed and ambulation.  Medication Inspection Compliance: Pill count conducted under aseptic conditions, in front of the patient. Neither the pills nor the bottle was removed from the patient's sight at any time. Once count was completed pills  were immediately returned to the patient in their original bottle.  Medication: Hydrocodone/APAP Pill/Patch Count: 0 of 90 pills remain Pill/Patch Appearance: Markings consistent with prescribed medication Bottle Appearance: Standard pharmacy container. Clearly labeled. Filled Date: 09/05 / 2019 Last Medication intake:  Day before yesterday   Pharmacokinetics: Liberation and absorption (onset of action): WNL Distribution (time to peak effect): WNL Metabolism and excretion (duration of action): WNL         Pharmacodynamics: Desired effects: Analgesia: Ms. Rauen reports >50% benefit. Functional ability: Patient reports that medication allows her to accomplish basic ADLs Clinically meaningful improvement in function (CMIF): Sustained CMIF goals met Perceived effectiveness: Described as relatively effective, allowing for increase in activities of daily living (ADL) Undesirable effects: Side-effects or Adverse reactions: None reported Monitoring: [place] PMP: Online review of the past 54-monthperiod conducted. Compliant with practice rules and regulations Last UDS on record: Summary  Date Value Ref Range Status  03/27/2018 FINAL  Final    Comment:    ==================================================================== TOXASSURE SELECT 13 (MW) ==================================================================== Test                             Result       Flag       Units Drug Present and Declared for Prescription Verification   Hydrocodone                    646          EXPECTED   ng/mg creat   Hydromorphone                  367          EXPECTED   ng/mg creat   Dihydrocodeine                 317          EXPECTED   ng/mg creat   Norhydrocodone  2063         EXPECTED   ng/mg creat    Sources of hydrocodone include scheduled prescription    medications. Hydromorphone, dihydrocodeine and norhydrocodone are    expected metabolites of hydrocodone. Hydromorphone and     dihydrocodeine are also available as scheduled prescription    medications. ==================================================================== Test                      Result    Flag   Units      Ref Range   Creatinine              24               mg/dL      >=20 ==================================================================== Declared Medications:  The flagging and interpretation on this report are based on the  following declared medications.  Unexpected results may arise from  inaccuracies in the declared medications.  **Note: The testing scope of this panel includes these medications:  Hydrocodone (Hydrocodone-Acetaminophen)  **Note: The testing scope of this panel does not include following  reported medications:  Acetaminophen (Hydrocodone-Acetaminophen)  Albuterol  Amlodipine  Aspirin (Aspirin 81)  Atorvastatin  Budenoside (Symbicort)  Clotrimazole (Lotrisone)  Diclofenac  Escitalopram  Formoterol (Symbicort)  Hydrochlorothiazide (HCTZ)  Ipratropium  Ketoconazole  Lactulose  Levetiracetam  Losartan (Losartan Potassium)  Lubiprostone  Mirtazapine  Nitroglycerin  Ondansetron  Pantoprazole  Potassium  Sucralfate  Tizanidine  Topical  Torsemide  Venlafaxine  Vitamin D2 (Ergocalciferol) ==================================================================== For clinical consultation, please call [phone]. ====================================================================    UDS interpretation: Compliant          Medication Assessment Form: Reviewed. Patient indicates being compliant with therapy Treatment compliance: Compliant Risk Assessment Profile: Aberrant behavior: See prior evaluations. None observed or detected today Comorbid factors increasing risk of overdose: See prior notes. No additional risks detected today Risk of substance use disorder (SUD): Low  ORT Scoring interpretation table:  Score <3 = Low Risk for SUD  Score between 4-7 =  Moderate Risk for SUD  Score >8 = High Risk for Opioid Abuse   Risk Mitigation Strategies:  Patient Counseling: Covered Patient-Prescriber Agreement (PPA): Present and active  Notification to other healthcare providers: Done  Pharmacologic Plan: No change in therapy, at this time.             Post-Procedure Assessment  09/12/2018 Procedure: Diagnostic bilateral intra-articular shoulder injections Pre-procedure pain score:  9/10 Post-procedure pain score: 0/10         Influential Factors: BMI: 26.93 kg/m Intra-procedural challenges: None observed.         Assessment challenges: None detected.              Reported side-effects: None.        Post-procedural adverse reactions or complications: None reported         Sedation: Please see nurses note. When no sedatives are used, the analgesic levels obtained are directly associated to the effectiveness of the local anesthetics. However, when sedation is provided, the level of analgesia obtained during the initial 1 hour following the intervention, is believed to be the result of a combination of factors. These factors may include, but are not limited to: 1. The effectiveness of the local anesthetics used. 2. The effects of the analgesic(s) and/or anxiolytic(s) used. 3. The degree of discomfort experienced by the patient at the time of the procedure. 4. The patients ability and reliability in recalling and recording the events. 5. The  presence and influence of possible secondary gains and/or psychosocial factors. Reported result: Relief experienced during the 1st hour after the procedure: 100 % (Ultra-Short Term Relief)            Interpretative annotation: Clinically appropriate result. Analgesia during this period is likely to be Local Anesthetic and/or IV Sedative (Analgesic/Anxiolytic) related.          Effects of local anesthetic: The analgesic effects attained during this period are directly associated to the localized infiltration of  local anesthetics and therefore cary significant diagnostic value as to the etiological location, or anatomical origin, of the pain. Expected duration of relief is directly dependent on the pharmacodynamics of the local anesthetic used. Long-acting (4-6 hours) anesthetics used.  Reported result: Relief during the next 4 to 6 hour after the procedure: 100 % (Short-Term Relief)            Interpretative annotation: Clinically appropriate result. Analgesia during this period is likely to be Local Anesthetic-related.          Long-term benefit: Defined as the period of time past the expected duration of local anesthetics (1 hour for short-acting and 4-6 hours for long-acting). With the possible exception of prolonged sympathetic blockade from the local anesthetics, benefits during this period are typically attributed to, or associated with, other factors such as analgesic sensory neuropraxia, antiinflammatory effects, or beneficial biochemical changes provided by agents other than the local anesthetics.  Reported result: Extended relief following procedure: 50 % (Long-Term Relief)            Interpretative annotation: Clinically possible results. Good relief. No permanent benefit expected. Inflammation plays a part in the etiology to the pain.          Current benefits: Defined as reported results that persistent at this point in time.   Analgesia: 50-75 %            Function: Somewhat improved ROM: Somewhat improved Interpretative annotation: Ongoing benefit.    Effective diagnostic intervention.          Interpretation: Results would suggest a successful diagnostic intervention.                  Plan:  Please see "Plan of Care" for details.                Laboratory Chemistry  Inflammation Markers (CRP: Acute Phase) (ESR: Chronic Phase) Lab Results  Component Value Date   CRP <0.8 02/07/2018   ESRSEDRATE 53 (H) 02/07/2018                         Rheumatology Markers No results found for:  RF, ANA, LABURIC, URICUR, LYMEIGGIGMAB, LYMEABIGMQN, HLAB27                      Renal Function Markers Lab Results  Component Value Date   BUN 10 04/18/2018   CREATININE 0.60 04/18/2018   GFRAA >60 04/18/2018   GFRNONAA >60 04/18/2018                             Hepatic Function Markers Lab Results  Component Value Date   AST 31 04/17/2018   ALT 22 04/17/2018   ALBUMIN 4.9 04/17/2018   ALKPHOS 65 04/17/2018                        Electrolytes Lab Results  Component Value Date   NA 136 04/18/2018   K 3.7 04/18/2018   CL 98 (L) 04/18/2018   CALCIUM 9.2 04/18/2018   MG 1.8 04/18/2018                        Neuropathy Markers Lab Results  Component Value Date   VITAMINB12 268 02/07/2018   HGBA1C 5.9 (H) 04/18/2018                        CNS Tests                       Bone Pathology Markers Lab Results  Component Value Date   25OHVITD1 14 (L) 02/07/2018   25OHVITD2 <1.0 02/07/2018   25OHVITD3 14 02/07/2018                         Coagulation Parameters Lab Results  Component Value Date   PLT 423 04/17/2018                        Cardiovascular Markers Lab Results  Component Value Date   HGB 10.8 (L) 04/17/2018   HCT 33.1 (L) 04/17/2018                         CA Markers No results found for: CEA, CA125, LABCA2                      Note: Lab results reviewed.  Recent Diagnostic Imaging Results  DG C-Arm 1-60 Min-No Report Fluoroscopy was utilized by the requesting physician.  No radiographic  interpretation.   Complexity Note: Imaging results reviewed. Results shared with Ms. Owens Shark, using Layman's terms.                         Meds   Current Outpatient Medications:  .  Albuterol Sulfate 108 (90 Base) MCG/ACT AEPB, Inhale 2 puffs into the lungs every 6 (six) hours as needed., Disp: , Rfl:  .  amLODipine (NORVASC) 10 MG tablet, Take 1 tablet (10 mg total) by mouth daily., Disp: 30 tablet, Rfl: 0 .  aspirin EC 81 MG tablet, Take 1 tablet (81 mg  total) by mouth daily., Disp: 30 tablet, Rfl: 0 .  atorvastatin (LIPITOR) 10 MG tablet, Take 1 tablet (10 mg total) by mouth daily., Disp: 30 tablet, Rfl: 0 .  betamethasone, augmented, (DIPROLENE) 0.05 % lotion, Apply topically 2 (two) times daily., Disp: , Rfl:  .  clotrimazole-betamethasone (LOTRISONE) cream, Apply 1 application topically 2 (two) times daily., Disp: 30 g, Rfl: 0 .  escitalopram (LEXAPRO) 20 MG tablet, Take 1 tablet (20 mg total) by mouth daily., Disp: 30 tablet, Rfl: 0 .  hydrochlorothiazide (HYDRODIURIL) 25 MG tablet, Take 1 tablet (25 mg total) by mouth daily., Disp: 30 tablet, Rfl: 0 .  [START ON 10/19/2018] HYDROcodone-acetaminophen (NORCO) 10-325 MG tablet, Take 1 tablet by mouth every 8 (eight) hours as needed for severe pain., Disp: 90 tablet, Rfl: 0 .  ipratropium (ATROVENT) 0.02 % nebulizer solution, Take 0.5 mg by nebulization every 4 (four) hours as needed for wheezing or shortness of breath., Disp: , Rfl:  .  levETIRAcetam (KEPPRA) 750 MG tablet, Take 1 tablet (750 mg total) by mouth daily., Disp: 30 tablet, Rfl: 0 .  losartan (COZAAR)  25 MG tablet, Take 1 tablet (25 mg total) by mouth daily., Disp: 30 tablet, Rfl: 0 .  nitroGLYCERIN (NITROSTAT) 0.4 MG SL tablet, Place 0.4 mg under the tongue every 5 (five) minutes as needed for chest pain., Disp: , Rfl:  .  pantoprazole (PROTONIX) 40 MG tablet, Take 1 tablet (40 mg total) by mouth daily., Disp: 30 tablet, Rfl: 0 .  potassium chloride SA (K-DUR,KLOR-CON) 20 MEQ tablet, Take 1 tablet (20 mEq total) by mouth 2 (two) times daily., Disp: 30 tablet, Rfl: 0 .  sucralfate (CARAFATE) 1 g tablet, Take 1 tablet (1 g total) by mouth 4 (four) times daily., Disp: 120 tablet, Rfl: 0 .  tiZANidine (ZANAFLEX) 4 MG tablet, Take 1 tablet (4 mg total) by mouth 3 (three) times daily., Disp: 90 tablet, Rfl: 1 .  torsemide (DEMADEX) 5 MG tablet, , Disp: , Rfl: 11 .  venlafaxine (EFFEXOR) 75 MG tablet, TK 1 T PO BID, Disp: , Rfl: 3 .   diclofenac sodium (VOLTAREN) 1 % GEL, Apply 2 g topically 4 (four) times daily., Disp: 3 Tube, Rfl: 1 .  [START ON 11/18/2018] HYDROcodone-acetaminophen (NORCO) 10-325 MG tablet, Take 1 tablet by mouth every 8 (eight) hours as needed for severe pain., Disp: 90 tablet, Rfl: 0 .  HYDROcodone-acetaminophen (NORCO) 10-325 MG tablet, Take 1 tablet by mouth every 6 (six) hours as needed for severe pain., Disp: 120 tablet, Rfl: 0 .  lidocaine (XYLOCAINE) 5 % ointment, Apply 1 application topically 4 (four) times daily as needed for moderate pain., Disp: 35.44 g, Rfl: 2  ROS  Constitutional: Denies any fever or chills Gastrointestinal: No reported hemesis, hematochezia, vomiting, or acute GI distress Musculoskeletal: Denies any acute onset joint swelling, redness, loss of ROM, or weakness Neurological: No reported episodes of acute onset apraxia, aphasia, dysarthria, agnosia, amnesia, paralysis, loss of coordination, or loss of consciousness  Allergies  Ms. Chapa is allergic to contrast media [iodinated diagnostic agents]; gabapentin; labetalol; sulfabenzamide; fentanyl; and penicillins.  [place]  Drug: Ms. Emmer  reports that she does not use drugs. Alcohol:  reports that she drinks alcohol. Tobacco:  reports that she has never smoked. She has never used smokeless tobacco. Medical:  has a past medical history of Asthma, Cerebral aneurysm, Chest pain (11/01/2016), Chronic migraine, Chronic respiratory failure ([place]), COPD (chronic obstructive pulmonary disease) ([place]), Hypertension, Osteoarthritis, Sarcoid, and Seizures ([place]). Surgical: Ms. Riggle  has a past surgical history that includes Cholecystectomy; Abdominal hysterectomy; and Spinal fusion. Family: family history is not on file.  Constitutional Exam  General appearance: Well nourished, well developed, and well hydrated. In no apparent acute distress Vitals:   09/19/18 0944  BP: (!) 149/93  Pulse: (!) 103  Resp: 16  Temp: 98.4 F (36.9 C)   TempSrc: Oral  SpO2: 100%  Weight: 152 lb (68.9 kg)  Height: '5\' 3"'$  (1.6 m)  Psych/Mental status: Alert, oriented x 3 (person, place, & time)       Eyes: PERLA Respiratory: No evidence of acute respiratory distress  Cervical Spine Area Exam  Skin & Axial Inspection: No masses, redness, edema, swelling, or associated skin lesions Alignment: Symmetrical Functional ROM: Unrestricted ROM      Stability: No instability detected Muscle Tone/Strength: Functionally intact. No obvious neuro-muscular anomalies detected. Sensory (Neurological): Unimpaired Palpation: No palpable anomalies              Upper Extremity (UE) Exam    Side: Right upper extremity  Side: Left upper extremity  Skin & Extremity Inspection:  Skin color, temperature, and hair growth are WNL. No peripheral edema or cyanosis. No masses, redness, swelling, asymmetry, or associated skin lesions. No contractures.  Skin & Extremity Inspection: Skin color, temperature, and hair growth are WNL. No peripheral edema or cyanosis. No masses, redness, swelling, asymmetry, or associated skin lesions. No contractures.  Functional ROM: Adequate ROM          Functional ROM: Adequate ROM          Muscle Tone/Strength: Functionally intact. No obvious neuro-muscular anomalies detected.  Muscle Tone/Strength: Functionally intact. No obvious neuro-muscular anomalies detected.  Sensory (Neurological): Unimpaired          Sensory (Neurological): Unimpaired          Palpation: No palpable anomalies              Palpation: No palpable anomalies                   Thoracic Spine Area Exam  Skin & Axial Inspection: No masses, redness, or swelling Alignment: Symmetrical Functional ROM: Unrestricted ROM Stability: No instability detected Muscle Tone/Strength: Functionally intact. No obvious neuro-muscular anomalies detected. Sensory (Neurological): Unimpaired Muscle strength & Tone: No palpable anomalies  Lumbar Spine Area Exam  Skin & Axial  Inspection: No masses, redness, or swelling Alignment: Symmetrical Functional ROM: Unrestricted ROM       Stability: No instability detected Muscle Tone/Strength: Functionally intact. No obvious neuro-muscular anomalies detected. Sensory (Neurological): Unimpaired Palpation: Complains of area being tender to palpation       Provocative Tests: Hyperextension/rotation test: deferred today       Lumbar quadrant test (Kemp's test): deferred today       Lateral bending test: deferred today       Patrick's Maneuver: deferred today                   FABER test: deferred today                   S-I anterior distraction/compression test: deferred today         S-I lateral compression test: deferred today         S-I Thigh-thrust test: deferred today         S-I Gaenslen's test: deferred today          Gait & Posture Assessment  Ambulation: Unassisted Gait: Relatively normal for age and body habitus Posture: WNL   Lower Extremity Exam    Side: Right lower extremity  Side: Left lower extremity  Stability: No instability observed          Stability: No instability observed          Skin & Extremity Inspection: Edema brace worn  Skin & Extremity Inspection: Edema knee brace worn  Functional ROM: Unrestricted ROM                  Functional ROM: Unrestricted ROM                  Muscle Tone/Strength: Functionally intact. No obvious neuro-muscular anomalies detected.  Muscle Tone/Strength: Functionally intact. No obvious neuro-muscular anomalies detected.  Sensory (Neurological): Unimpaired  Sensory (Neurological): Unimpaired  Palpation: No palpable anomalies  Palpation: No palpable anomalies   Assessment  Primary Diagnosis & Pertinent Problem List: The primary encounter diagnosis was Chronic shoulder pain (Fifth Area of Pain) (Bilateral) (R>L). Diagnoses of Lumbar spondylosis, Chronic knee pain (Fourth Area of Pain) (Bilateral) (R>L), Chronic pain syndrome, Chronic low back pain (  Midline)  (Secondary Area of Pain), and Chronic musculoskeletal pain were also pertinent to this visit.  Status Diagnosis  Controlled Persistent Persistent 1. Chronic shoulder pain (Fifth Area of Pain) (Bilateral) (R>L)   2. Lumbar spondylosis   3. Chronic knee pain (Fourth Area of Pain) (Bilateral) (R>L)   4. Chronic pain syndrome   5. Chronic low back pain (Midline) (Secondary Area of Pain)   6. Chronic musculoskeletal pain     Problems updated and reviewed during this visit: No problems updated. Plan of Care  Pharmacotherapy (Medications Ordered): Meds ordered this encounter  Medications  . HYDROcodone-acetaminophen (NORCO) 10-325 MG tablet    Sig: Take 1 tablet by mouth every 8 (eight) hours as needed for severe pain.    Dispense:  90 tablet    Refill:  0    Do not place this medication, or any other prescription from our practice, on "Automatic Refill". Patient may have prescription filled one day early if pharmacy is closed on scheduled refill date.    Order Specific Question:   Supervising Provider    Answer:   Milinda Pointer [phone]  . HYDROcodone-acetaminophen (NORCO) 10-325 MG tablet    Sig: Take 1 tablet by mouth every 8 (eight) hours as needed for severe pain.    Dispense:  90 tablet    Refill:  0    Do not add this medication to the electronic "Automatic Refill" notification system. Patient may have prescription filled one day early if pharmacy is closed on scheduled refill date.    Order Specific Question:   Supervising Provider    Answer:   Milinda Pointer [phone]  . tiZANidine (ZANAFLEX) 4 MG tablet    Sig: Take 1 tablet (4 mg total) by mouth 3 (three) times daily.    Dispense:  90 tablet    Refill:  1    Do not place medication on "Automatic Refill". Fill one day early if pharmacy is closed on scheduled refill date.    Order Specific Question:   Supervising Provider    Answer:   Milinda Pointer [phone]  . HYDROcodone-acetaminophen (NORCO) 10-325 MG tablet     Sig: Take 1 tablet by mouth every 6 (six) hours as needed for severe pain.    Dispense:  120 tablet    Refill:  0    Do not place this medication, or any other prescription from our practice, on "Automatic Refill". Patient may have prescription filled one day early if pharmacy is closed on scheduled refill date.    Order Specific Question:   Supervising Provider    Answer:   Milinda Pointer [phone]  . diclofenac sodium (VOLTAREN) 1 % GEL    Sig: Apply 2 g topically 4 (four) times daily.    Dispense:  3 Tube    Refill:  1    Order Specific Question:   Supervising Provider    Answer:   Milinda Pointer [phone]  . lidocaine (XYLOCAINE) 5 % ointment    Sig: Apply 1 application topically 4 (four) times daily as needed for moderate pain.    Dispense:  35.44 g    Refill:  2    Maximum dose: 5 g/application (approximately 6 inches of ointment); 20 g/day    Order Specific Question:   Supervising Provider    Answer:   Milinda Pointer [phone]   New Prescriptions   HYDROCODONE-ACETAMINOPHEN (NORCO) 10-325 MG TABLET    Take 1 tablet by mouth every 6 (six) hours as needed for severe pain.  LIDOCAINE (XYLOCAINE) 5 % OINTMENT    Apply 1 application topically 4 (four) times daily as needed for moderate pain.   Medications administered today: Gianelle F. Tippetts had no medications administered during this visit. Lab-work, procedure(s), and/or referral(s): No orders of the defined types were placed in this encounter.  Imaging and/or referral(s): None Interventional management options: Planned, scheduled, and/or pending: bilateral intra-articular Hyalgan knee injections #5 Diagnostic bilateral lumbar facet block   Considering: Diagnostic bilateral greater occipital nerve block Possible bilateral occipital nerve RFA Possible bilateral occipital nerve peripheral nerves stimulator trial Diagnostic bilateral cervical facet block Possible bilateral cervical facet  RFA Diagnostic right-sided cervical epidural steroid injection Diagnostic bilateral intra-articular shoulder joint injection Diagnostic bilateral suprascapular nerve block Possible bilateral suprascapular nerve RFA Diagnostic bilateral lumbar facet block Possible bilateral lumbar facet RFA Diagnostic right-sided lumbar epidural steroid injection Diagnostic bilateral transforaminal epidural steroid injection Diagnostic bilateral intra-articular hip joint injection Diagnostic bilateral femoral nerve + obturator nerve block Possible bilateral femoral nerve + obturator nerve RFA Diagnostic bilateral intra-articular knee joint injection with local anesthetic and steroid Possible series of 5 bilateral intra-articular Hyalgan knee injections Diagnostic bilateral genicular nerve block Possible bilateral genicular nerve RFA   PRN Procedures: None at this time    Provider-requested follow-up: Return in about 3 months (around 12/20/2018) for MedMgmt.  Future Appointments  Date Time Provider [place]  09/30/2018  9:30 AM Milinda Pointer, MD [facility] None  12/23/2018  9:30 AM Vevelyn Francois, NP [facility] None   Primary Care Physician: Jodi Marble, MD Location: [facility] Note by: Vevelyn Francois NP Date: 09/19/2018; Time: 3:39 PM  Pain Score Disclaimer: We use the NRS-11 scale. This is a self-reported, subjective measurement of pain severity with only modest accuracy. It is used primarily to identify changes within a particular patient. It must be understood that outpatient pain scales are significantly less accurate that those used for research, where they can be applied under ideal controlled circumstances with minimal exposure to variables. In reality, the score is likely to be a combination of pain intensity and pain affect, where pain affect describes the degree of emotional arousal or changes in action readiness caused by the  sensory experience of pain. Factors such as social and work situation, setting, emotional state, anxiety levels, expectation, and prior pain experience may influence pain perception and show large inter-individual differences that may also be affected by time variables.  Patient instructions provided during this appointment: Patient Instructions   ____________________________________________________________________________________________  Medication Rules  Applies to: All patients receiving prescriptions (written or electronic).  Pharmacy of record: Pharmacy where electronic prescriptions will be sent. If written prescriptions are taken to a different pharmacy, please inform the nursing staff. The pharmacy listed in the electronic medical record should be the one where you would like electronic prescriptions to be sent.  Prescription refills: Only during scheduled appointments. Applies to both, written and electronic prescriptions.  NOTE: The following applies primarily to controlled substances (Opioid* Pain Medications).   Patient's responsibilities: 1. Pain Pills: Bring all pain pills to every appointment (except for procedure appointments). 2. Pill Bottles: Bring pills in original pharmacy bottle. Always bring newest bottle. Bring bottle, even if empty. 3. Medication refills: You are responsible for knowing and keeping track of what medications you need refilled. The day before your appointment, write a list of all prescriptions that need to be refilled. Bring that list to your appointment and give it to the admitting nurse. Prescriptions will be written  only during appointments. If you forget a medication, it will not be "Called in", "Faxed", or "electronically sent". You will need to get another appointment to get these prescribed. 4. Prescription Accuracy: You are responsible for carefully inspecting your prescriptions before leaving our office. Have the discharge nurse carefully go over  each prescription with you, before taking them home. Make sure that your name is accurately spelled, that your address is correct. Check the name and dose of your medication to make sure it is accurate. Check the number of pills, and the written instructions to make sure they are clear and accurate. Make sure that you are given enough medication to last until your next medication refill appointment. 5. Taking Medication: Take medication as prescribed. Never take more pills than instructed. Never take medication more frequently than prescribed. Taking less pills or less frequently is permitted and encouraged, when it comes to controlled substances (written prescriptions).  6. Inform other Doctors: Always inform, all of your healthcare providers, of all the medications you take. 7. Pain Medication from other Providers: You are not allowed to accept any additional pain medication from any other Doctor or Healthcare provider. There are two exceptions to this rule. (see below) In the event that you require additional pain medication, you are responsible for notifying us, as stated below. 8. Medication Agreement: You are responsible for carefully reading and following our Medication Agreement. This must be signed before receiving any prescriptions from our practice. Safely store a copy of your signed Agreement. Violations to the Agreement will result in no further prescriptions. (Additional copies of our Medication Agreement are available upon request.) 9. Laws, Rules, & Regulations: All patients are expected to follow all Federal and [employer], Rules, [employer]. Ignorance of the Laws does not constitute a valid excuse. The use of any illegal substances is prohibited. 10. Adopted CDC guidelines & recommendations: Target dosing levels will be at or below 60 MME/day. Use of benzodiazepines** is not recommended.  Exceptions: There are only two exceptions to the rule of not receiving pain medications  from other Healthcare Providers. 1. Exception #1 (Emergencies): In the event of an emergency (i.e.: accident requiring emergency care), you are allowed to receive additional pain medication. However, you are responsible for: As soon as you are able, call our office [phone], at any time of the day or night, and leave a message stating your name, the date and nature of the emergency, and the name and dose of the medication prescribed. In the event that your call is answered by a member of our staff, make sure to document and save the date, time, and the name of the person that took your information.  2. Exception #2 (Planned Surgery): In the event that you are scheduled by another doctor or dentist to have any type of surgery or procedure, you are allowed (for a period no longer than 30 days), to receive additional pain medication, for the acute post-op pain. However, in this case, you are responsible for picking up a copy of our "Post-op Pain Management for Surgeons" handout, and giving it to your surgeon or dentist. This document is available at our office, and does not require an appointment to obtain it. Simply go to our office during business hours (Monday-Thursday from 8:00 AM to 4:00 PM) (Friday 8:00 AM to 12:00 Noon) or if you have a scheduled appointment with [place], prior to your surgery, and ask for it by name. In addition, you will need to provide  [place] with your name, name of your surgeon, type of surgery, and date of procedure or surgery.  *Opioid medications include: morphine, codeine, oxycodone, oxymorphone, hydrocodone, hydromorphone, meperidine, tramadol, tapentadol, buprenorphine, fentanyl, methadone. **Benzodiazepine medications include: diazepam (Valium), alprazolam (Xanax), clonazepam (Klonopine), lorazepam (Ativan), clorazepate (Tranxene), chlordiazepoxide (Librium), estazolam (Prosom), oxazepam (Serax), temazepam (Restoril), triazolam (Halcion) (Last updated:  02/14/2018) ____________________________________________________________________________________________   BMI Assessment: Estimated body mass index is 26.93 kg/m as calculated from the following:   Height as of this encounter: '5\' 3"'$  (1.6 m).   Weight as of this encounter: 152 lb (68.9 kg).  BMI interpretation table: BMI level Category Range association with higher incidence of chronic pain  <18 kg/m2 Underweight   18.5-24.9 kg/m2 Ideal body weight   25-29.9 kg/m2 Overweight Increased incidence by 20%  30-34.9 kg/m2 Obese (Class I) Increased incidence by 68%  35-39.9 kg/m2 Severe obesity (Class II) Increased incidence by 136%  >40 kg/m2 Extreme obesity (Class III) Increased incidence by 254%   Patient's current BMI Ideal Body weight  Body mass index is 26.93 kg/m. Ideal body weight: 52.4 kg (115 lb 8.3 oz) Adjusted ideal body weight: 59 kg (130 lb 1.8 oz)   BMI Readings from Last 4 Encounters:  09/19/18 26.93 kg/m  09/12/18 28.15 kg/m  08/22/18 27.46 kg/m  08/07/18 28.67 kg/m   Wt Readings from Last 4 Encounters:  09/19/18 152 lb (68.9 kg)  09/12/18 164 lb (74.4 kg)  08/22/18 165 lb (74.8 kg)  08/07/18 167 lb (75.8 kg)   Scripts were all e-scribed.

## 2018-09-19 NOTE — Progress Notes (Signed)
Nursing Pain Medication Assessment:  Safety precautions to be maintained throughout the outpatient stay will include: orient to surroundings, keep bed in low position, maintain call bell within reach at all times, provide assistance with transfer out of bed and ambulation.  Medication Inspection Compliance: Pill count conducted under aseptic conditions, in front of the patient. Neither the pills nor the bottle was removed from the patient's sight at any time. Once count was completed pills were immediately returned to the patient in their original bottle.  Medication: Hydrocodone/APAP Pill/Patch Count: 0 of 90 pills remain Pill/Patch Appearance: Markings consistent with prescribed medication Bottle Appearance: Standard pharmacy container. Clearly labeled. Filled Date: 09/05 / 2019 Last Medication intake:  Day before yesterday

## 2018-09-23 ENCOUNTER — Encounter: Payer: Self-pay | Admitting: Nurse Practitioner

## 2018-09-26 ENCOUNTER — Telehealth: Payer: Self-pay

## 2018-09-26 NOTE — Telephone Encounter (Signed)
Please call her re her medications.

## 2018-09-26 NOTE — Telephone Encounter (Signed)
Returned patient call, patient was just checking on [place] apparently.  States she needed the number for [facility].  Given.

## 2018-09-30 ENCOUNTER — Ambulatory Visit: Payer: Self-pay | Admitting: Pain Medicine

## 2018-10-03 NOTE — Progress Notes (Deleted)
Patient's Name: Jean Gomez  MRN: [id number]  Referring Provider: Jodi Marble, MD  DOB: 1956/09/13  PCP: Jodi Marble, MD  DOS: 10/07/2018  Note by: Gaspar Cola, MD  Service setting: Ambulatory outpatient  Specialty: Interventional Pain Management  Location: [facility] (AMB) Pain Management Facility    Patient type: Established   Primary Reason(s) for Visit: Encounter for post-procedure evaluation of chronic illness with mild to moderate exacerbation CC: No chief complaint on file.  HPI  Jean Gomez is a 62 y.o. year old, female patient, who comes today for a post-procedure evaluation. She has Chronic pain syndrome; Pharmacologic therapy; Disorder of skeletal system; Problems influencing health status; Long term current use of opiate analgesic; Chronic low back pain (Bilateral) with right-sided sciatica (Secondary Area of Pain) (R>L); Chronic lower extremity pain (Tertiary Area of Pain) (Bilateral) (R>L); Chronic knee pain (Fourth Area of Pain) (Bilateral) (R>L); Chronic shoulder pain (Fifth Area of Pain) (Bilateral) (R>L); Occipital neuralgia (Primary Area of Pain) (Bilateral) (R>L); Chronic neck pain (Bilateral) (R>L); Chronic sacroiliac joint pain (Right); Chronic migraine without aura; Chronic respiratory failure with hypoxia ([place]); Constipation; Epilepsy ([place]); HTN, goal below 140/90; Essential tremor; Generalized anxiety disorder; H/O aneurysm; GERD (gastroesophageal reflux disease); History of depression; History of seizure disorder; Hypercholesterolemia; Insomnia; Irritable bowel syndrome; Major depression in remission ([place]); Muscle spasm; Obstructive sleep apnea on CPAP; Osteoarthritis of knee (Bilateral) (R>L); COPD (chronic obstructive pulmonary disease) ([place]); Rotator cuff injury; Sarcoidosis; Spondylosis without myelopathy or radiculopathy, lumbosacral region; Vitamin D deficiency; Chronic shoulder pain (Right); Nonruptured cerebral aneurysm; Chronic headache disorder  (Primary Area of Pain); Pulmonary emphysema ([address]); Seizure disorder ([place]); Hiatal hernia; Occipital headache (Right); Frontal headache (Left); Chronic low back pain (Midline) (Secondary Area of Pain); Chronic lumbar radiculopathy (Right); Osteoarthritis involving multiple joints; Osteoarthritis of shoulder (Bilateral); Chronic musculoskeletal pain; Lumbar facet joint syndrome (Bilateral) (R>L); Chronic hip pain (Tertiary Area of Pain) (Bilateral) (R>L); DDD (degenerative disc disease), cervical; Cervical facet arthropathy (Bilateral); Cervical facet syndrome (Bilateral) (R>L); Cervical Grade 1 Anterolisthesis of C4 over C5 (Degenerative); Cervical Grade 1 Retrolisthesis of C5 over C6; Cervical foraminal stenosis (C3-4, C4-5, and C5-6) (Bilateral); DDD (degenerative disc disease), lumbar; Osteoarthritis of lumbar spine; Osteoarthritis of facet joint of lumbar spine; Spondylosis without myelopathy or radiculopathy, cervicothoracic region; Bipolar affective disorder, current episode manic ([place]); History of allergy to radiographic contrast media; and Lumbar spondylosis on their problem list. Her primarily concern today is the No chief complaint on file.  Pain Assessment: Location:     Radiating:   Onset:   Duration:   Quality:   Severity:  /10 (subjective, self-reported pain score)  Note: Reported level is compatible with observation.                         When using our objective Pain Scale, levels between 6 and 10/10 are said to belong in an emergency room, as it progressively worsens from a 6/10, described as severely limiting, requiring emergency care not usually available at an outpatient pain management facility. At a 6/10 level, communication becomes difficult and requires great effort. Assistance to reach the emergency department may be required. Facial flushing and profuse sweating along with potentially dangerous increases in heart rate and blood pressure will be evident. Effect on ADL:   Timing:    Modifying factors:   BP:    HR:    Jean Gomez comes in today for post-procedure evaluation.  Further details on both, my assessment(s), as well as  the proposed treatment plan, please see below.  Post-Procedure Assessment #1  09/12/2018 Procedure: Diagnostic bilateral intra-articular shoulder joint injection #1 under fluoroscopic guidance and IV sedation Pre-procedure pain score:  9/10 Post-procedure pain score: 0/10 (100% relief) Influential Factors: BMI:   Intra-procedural challenges: None observed.         Assessment challenges: None detected.              Reported side-effects: None.        Post-procedural adverse reactions or complications: None reported         Sedation: Sedation provided. When no sedatives are used, the analgesic levels obtained are directly associated to the effectiveness of the local anesthetics. However, when sedation is provided, the level of analgesia obtained during the initial 1 hour following the intervention, is believed to be the result of a combination of factors. These factors may include, but are not limited to: 1. The effectiveness of the local anesthetics used. 2. The effects of the analgesic(s) and/or anxiolytic(s) used. 3. The degree of discomfort experienced by the patient at the time of the procedure. 4. The patients ability and reliability in recalling and recording the events. 5. The presence and influence of possible secondary gains and/or psychosocial factors. Reported result: Relief experienced during the 1st hour after the procedure:   (Ultra-Short Term Relief)            Interpretative annotation: Clinically appropriate result. Analgesia during this period is likely to be Local Anesthetic and/or IV Sedative (Analgesic/Anxiolytic) related.          Effects of local anesthetic: The analgesic effects attained during this period are directly associated to the localized infiltration of local anesthetics and therefore cary significant diagnostic  value as to the etiological location, or anatomical origin, of the pain. Expected duration of relief is directly dependent on the pharmacodynamics of the local anesthetic used. Long-acting (4-6 hours) anesthetics used.  Reported result: Relief during the next 4 to 6 hour after the procedure:   (Short-Term Relief)            Interpretative annotation: Clinically appropriate result. Analgesia during this period is likely to be Local Anesthetic-related.          Long-term benefit: Defined as the period of time past the expected duration of local anesthetics (1 hour for short-acting and 4-6 hours for long-acting). With the possible exception of prolonged sympathetic blockade from the local anesthetics, benefits during this period are typically attributed to, or associated with, other factors such as analgesic sensory neuropraxia, antiinflammatory effects, or beneficial biochemical changes provided by agents other than the local anesthetics.  Reported result: Extended relief following procedure:   (Long-Term Relief)            Interpretative annotation: Clinically possible results. Good relief. No permanent benefit expected. Inflammation plays a part in the etiology to the pain.          Current benefits: Defined as reported results that persistent at this point in time.   Analgesia: *** %            Function: Somewhat improved ROM: Somewhat improved Interpretative annotation: Recurrence of symptoms. No permanent benefit expected. Effective diagnostic intervention.          Interpretation: Results would suggest a successful diagnostic intervention.                  Plan:  Please see "Plan of Care" for details.  Post-Procedure Assessment #2  09/12/2018 Procedure: Diagnostic bilateral lumbar facet block #2 under fluoroscopic guidance and IV sedation Pre-procedure pain score:  8/10 Post-procedure pain score: 0/10 (100% relief) Influential Factors: BMI:   Intra-procedural challenges:  None observed.         Assessment challenges: None detected.              Reported side-effects: None.        Post-procedural adverse reactions or complications: None reported         Sedation: Sedation provided. When no sedatives are used, the analgesic levels obtained are directly associated to the effectiveness of the local anesthetics. However, when sedation is provided, the level of analgesia obtained during the initial 1 hour following the intervention, is believed to be the result of a combination of factors. These factors may include, but are not limited to: 1. The effectiveness of the local anesthetics used. 2. The effects of the analgesic(s) and/or anxiolytic(s) used. 3. The degree of discomfort experienced by the patient at the time of the procedure. 4. The patients ability and reliability in recalling and recording the events. 5. The presence and influence of possible secondary gains and/or psychosocial factors. Reported result: Relief experienced during the 1st hour after the procedure:  100% (Ultra-Short Term Relief) Jean Gomez has indicated area to have been numb during this time. Interpretative annotation: Clinically appropriate result. Analgesia during this period is likely to be Local Anesthetic and/or IV Sedative (Analgesic/Anxiolytic) related.          Effects of local anesthetic: The analgesic effects attained during this period are directly associated to the localized infiltration of local anesthetics and therefore cary significant diagnostic value as to the etiological location, or anatomical origin, of the pain. Expected duration of relief is directly dependent on the pharmacodynamics of the local anesthetic used. Long-acting (4-6 hours) anesthetics used.  Reported result: Relief during the next 4 to 6 hour after the procedure:  100% (Short-Term Relief) Jean Gomez has indicated area to have been numb during this time. Interpretative annotation: Clinically appropriate result.  Analgesia during this period is likely to be Local Anesthetic-related.          Long-term benefit: Defined as the period of time past the expected duration of local anesthetics (1 hour for short-acting and 4-6 hours for long-acting). With the possible exception of prolonged sympathetic blockade from the local anesthetics, benefits during this period are typically attributed to, or associated with, other factors such as analgesic sensory neuropraxia, antiinflammatory effects, or beneficial biochemical changes provided by agents other than the local anesthetics.  Reported result: Extended relief following procedure:  0% (Long-Term Relief)            Interpretative annotation: Clinically possible results. No long-term benefit. No long-term benefit attained. No significant inflammatory component detected. Etiology is likely mechanical rather than inflammatory.  Current benefits: Defined as reported results that persistent at this point in time.   Analgesia: 0-25 %            Function: Back to baseline ROM: Back to baseline Interpretative annotation: Recurrence of symptoms. Therapeutic failure. Effective diagnostic intervention.          Interpretation: Results would suggest Jean Gomez to be a good candidate for Radiofrequency Ablation. We'll proceed with the next treatment, as soon as convenient          Plan:  Proceed with Radiofrequency Ablation for the purpose of attaining long-term benefits.       "The patient  has failed to respond to conservative therapies including over-the-counter medications, anti-inflammatories, muscle relaxants, membrane stabilizers, opioids, physical therapy modalities such as heat and ice, as well as more invasive techniques such as nerve blocks. Because Ms. Ravan did attain more than 50% relief of the pain during a series of diagnostic blocks conducted in separate occasions, I believe it is medically necessary to proceed with Radiofrequency Ablation, in order to attempt gaining  longer relief.  Laboratory Chemistry  Inflammation Markers (CRP: Acute Phase) (ESR: Chronic Phase) Lab Results  Component Value Date   CRP <0.8 02/07/2018   ESRSEDRATE 53 (H) 02/07/2018                         Renal Markers Lab Results  Component Value Date   BUN 10 04/18/2018   CREATININE 0.60 04/18/2018   GFRAA >60 04/18/2018   GFRNONAA >60 04/18/2018                             Hepatic Markers Lab Results  Component Value Date   AST 31 04/17/2018   ALT 22 04/17/2018   ALBUMIN 4.9 04/17/2018                        Neuropathy Markers Lab Results  Component Value Date   VITAMINB12 268 02/07/2018   HGBA1C 5.9 (H) 04/18/2018                        Hematology Parameters Lab Results  Component Value Date   PLT 423 04/17/2018   HGB 10.8 (L) 04/17/2018   HCT 33.1 (L) 04/17/2018                        CV Markers No results found for: BNP, CKTOTAL, CKMB, TROPONINI                       Note: Lab results reviewed.  Recent Imaging Results   Results for orders placed in visit on 09/12/18  DG C-Arm 1-60 Min-No Report   Narrative Fluoroscopy was utilized by the requesting physician.  No radiographic  interpretation.    Interpretation Report: Fluoroscopy was used during the procedure to assist with needle guidance. The images were interpreted intraoperatively by the requesting physician.  Meds   Current Outpatient Medications:  .  Albuterol Sulfate 108 (90 Base) MCG/ACT AEPB, Inhale 2 puffs into the lungs every 6 (six) hours as needed., Disp: , Rfl:  .  amLODipine (NORVASC) 10 MG tablet, Take 1 tablet (10 mg total) by mouth daily., Disp: 30 tablet, Rfl: 0 .  aspirin EC 81 MG tablet, Take 1 tablet (81 mg total) by mouth daily., Disp: 30 tablet, Rfl: 0 .  atorvastatin (LIPITOR) 10 MG tablet, Take 1 tablet (10 mg total) by mouth daily., Disp: 30 tablet, Rfl: 0 .  betamethasone, augmented, (DIPROLENE) 0.05 % lotion, Apply topically 2 (two) times daily., Disp: , Rfl:  .   clotrimazole-betamethasone (LOTRISONE) cream, Apply 1 application topically 2 (two) times daily., Disp: 30 g, Rfl: 0 .  diclofenac sodium (VOLTAREN) 1 % GEL, Apply 2 g topically 4 (four) times daily., Disp: 3 Tube, Rfl: 1 .  escitalopram (LEXAPRO) 20 MG tablet, Take 1 tablet (20 mg total) by mouth daily., Disp: 30 tablet, Rfl: 0 .  hydrochlorothiazide (HYDRODIURIL) 25 MG tablet, Take 1  tablet (25 mg total) by mouth daily., Disp: 30 tablet, Rfl: 0 .  [START ON 11/18/2018] HYDROcodone-acetaminophen (NORCO) 10-325 MG tablet, Take 1 tablet by mouth every 8 (eight) hours as needed for severe pain., Disp: 90 tablet, Rfl: 0 .  [START ON 10/19/2018] HYDROcodone-acetaminophen (NORCO) 10-325 MG tablet, Take 1 tablet by mouth every 8 (eight) hours as needed for severe pain., Disp: 90 tablet, Rfl: 0 .  HYDROcodone-acetaminophen (NORCO) 10-325 MG tablet, Take 1 tablet by mouth every 6 (six) hours as needed for severe pain., Disp: 120 tablet, Rfl: 0 .  ipratropium (ATROVENT) 0.02 % nebulizer solution, Take 0.5 mg by nebulization every 4 (four) hours as needed for wheezing or shortness of breath., Disp: , Rfl:  .  levETIRAcetam (KEPPRA) 750 MG tablet, Take 1 tablet (750 mg total) by mouth daily., Disp: 30 tablet, Rfl: 0 .  lidocaine (XYLOCAINE) 5 % ointment, Apply 1 application topically 4 (four) times daily as needed for moderate pain., Disp: 35.44 g, Rfl: 2 .  losartan (COZAAR) 25 MG tablet, Take 1 tablet (25 mg total) by mouth daily., Disp: 30 tablet, Rfl: 0 .  nitroGLYCERIN (NITROSTAT) 0.4 MG SL tablet, Place 0.4 mg under the tongue every 5 (five) minutes as needed for chest pain., Disp: , Rfl:  .  pantoprazole (PROTONIX) 40 MG tablet, Take 1 tablet (40 mg total) by mouth daily., Disp: 30 tablet, Rfl: 0 .  potassium chloride SA (K-DUR,KLOR-CON) 20 MEQ tablet, Take 1 tablet (20 mEq total) by mouth 2 (two) times daily., Disp: 30 tablet, Rfl: 0 .  sucralfate (CARAFATE) 1 g tablet, Take 1 tablet (1 g total) by mouth 4  (four) times daily., Disp: 120 tablet, Rfl: 0 .  tiZANidine (ZANAFLEX) 4 MG tablet, Take 1 tablet (4 mg total) by mouth 3 (three) times daily., Disp: 90 tablet, Rfl: 1 .  torsemide (DEMADEX) 5 MG tablet, , Disp: , Rfl: 11 .  venlafaxine (EFFEXOR) 75 MG tablet, TK 1 T PO BID, Disp: , Rfl: 3  ROS  Constitutional: Denies any fever or chills Gastrointestinal: No reported hemesis, hematochezia, vomiting, or acute GI distress Musculoskeletal: Denies any acute onset joint swelling, redness, loss of ROM, or weakness Neurological: No reported episodes of acute onset apraxia, aphasia, dysarthria, agnosia, amnesia, paralysis, loss of coordination, or loss of consciousness  Allergies  Jean Gomez is allergic to contrast media [iodinated diagnostic agents]; gabapentin; labetalol; sulfabenzamide; fentanyl; and penicillins.  [place]  Drug: Jean Gomez  reports that she does not use drugs. Alcohol:  reports that she drinks alcohol. Tobacco:  reports that she has never smoked. She has never used smokeless tobacco. Medical:  has a past medical history of Asthma, Cerebral aneurysm, Chest pain (11/01/2016), Chronic migraine, Chronic respiratory failure ([place]), COPD (chronic obstructive pulmonary disease) ([place]), Hypertension, Osteoarthritis, Sarcoid, and Seizures ([place]). Surgical: Jean Gomez  has a past surgical history that includes Cholecystectomy; Abdominal hysterectomy; and Spinal fusion. Family: family history is not on file.  Constitutional Exam  General appearance: Well nourished, well developed, and well hydrated. In no apparent acute distress There were no vitals filed for this visit. BMI Assessment: Estimated body mass index is 26.93 kg/m as calculated from the following:   Height as of 09/19/18: '5\' 3"'$  (1.6 m).   Weight as of 09/19/18: 152 lb (68.9 kg).  BMI interpretation table: BMI level Category Range association with higher incidence of chronic pain  <18 kg/m2 Underweight   18.5-24.9 kg/m2 Ideal body  weight   25-29.9 kg/m2 Overweight Increased incidence by  20%  30-34.9 kg/m2 Obese (Class I) Increased incidence by 68%  35-39.9 kg/m2 Severe obesity (Class II) Increased incidence by 136%  >40 kg/m2 Extreme obesity (Class III) Increased incidence by 254%   Patient's current BMI Ideal Body weight  There is no height or weight on file to calculate BMI. Patient weight not recorded   BMI Readings from Last 4 Encounters:  09/19/18 26.93 kg/m  09/12/18 28.15 kg/m  08/22/18 27.46 kg/m  08/07/18 28.67 kg/m   Wt Readings from Last 4 Encounters:  09/19/18 152 lb (68.9 kg)  09/12/18 164 lb (74.4 kg)  08/22/18 165 lb (74.8 kg)  08/07/18 167 lb (75.8 kg)  Psych/Mental status: Alert, oriented x 3 (person, place, & time)       Eyes: PERLA Respiratory: No evidence of acute respiratory distress  Cervical Spine Area Exam  Skin & Axial Inspection: No masses, redness, edema, swelling, or associated skin lesions Alignment: Symmetrical Functional ROM: Unrestricted ROM      Stability: No instability detected Muscle Tone/Strength: Functionally intact. No obvious neuro-muscular anomalies detected. Sensory (Neurological): Unimpaired Palpation: No palpable anomalies              Upper Extremity (UE) Exam    Side: Right upper extremity  Side: Left upper extremity  Skin & Extremity Inspection: Skin color, temperature, and hair growth are WNL. No peripheral edema or cyanosis. No masses, redness, swelling, asymmetry, or associated skin lesions. No contractures.  Skin & Extremity Inspection: Skin color, temperature, and hair growth are WNL. No peripheral edema or cyanosis. No masses, redness, swelling, asymmetry, or associated skin lesions. No contractures.  Functional ROM: Unrestricted ROM          Functional ROM: Unrestricted ROM          Muscle Tone/Strength: Functionally intact. No obvious neuro-muscular anomalies detected.  Muscle Tone/Strength: Functionally intact. No obvious neuro-muscular anomalies  detected.  Sensory (Neurological): Unimpaired          Sensory (Neurological): Unimpaired          Palpation: No palpable anomalies              Palpation: No palpable anomalies              Provocative Test(s):  Phalen's test: deferred Tinel's test: deferred Apley's scratch test (touch opposite shoulder):  Action 1 (Across chest): deferred Action 2 (Overhead): deferred Action 3 (LB reach): deferred   Provocative Test(s):  Phalen's test: deferred Tinel's test: deferred Apley's scratch test (touch opposite shoulder):  Action 1 (Across chest): deferred Action 2 (Overhead): deferred Action 3 (LB reach): deferred    Thoracic Spine Area Exam  Skin & Axial Inspection: No masses, redness, or swelling Alignment: Symmetrical Functional ROM: Unrestricted ROM Stability: No instability detected Muscle Tone/Strength: Functionally intact. No obvious neuro-muscular anomalies detected. Sensory (Neurological): Unimpaired Muscle strength & Tone: No palpable anomalies  Lumbar Spine Area Exam  Skin & Axial Inspection: No masses, redness, or swelling Alignment: Symmetrical Functional ROM: Unrestricted ROM       Stability: No instability detected Muscle Tone/Strength: Functionally intact. No obvious neuro-muscular anomalies detected. Sensory (Neurological): Unimpaired Palpation: No palpable anomalies       Provocative Tests: Hyperextension/rotation test: deferred today       Lumbar quadrant test (Kemp's test): deferred today       Lateral bending test: deferred today       Patrick's Maneuver: deferred today  FABER test: deferred today                   S-I anterior distraction/compression test: deferred today         S-I lateral compression test: deferred today         S-I Thigh-thrust test: deferred today         S-I Gaenslen's test: deferred today          Gait & Posture Assessment  Ambulation: Unassisted Gait: Relatively normal for age and body habitus Posture: WNL    Lower Extremity Exam    Side: Right lower extremity  Side: Left lower extremity  Stability: No instability observed          Stability: No instability observed          Skin & Extremity Inspection: Skin color, temperature, and hair growth are WNL. No peripheral edema or cyanosis. No masses, redness, swelling, asymmetry, or associated skin lesions. No contractures.  Skin & Extremity Inspection: Skin color, temperature, and hair growth are WNL. No peripheral edema or cyanosis. No masses, redness, swelling, asymmetry, or associated skin lesions. No contractures.  Functional ROM: Unrestricted ROM                  Functional ROM: Unrestricted ROM                  Muscle Tone/Strength: Functionally intact. No obvious neuro-muscular anomalies detected.  Muscle Tone/Strength: Functionally intact. No obvious neuro-muscular anomalies detected.  Sensory (Neurological): Unimpaired  Sensory (Neurological): Unimpaired  Palpation: No palpable anomalies  Palpation: No palpable anomalies   Assessment  Primary Diagnosis & Pertinent Problem List: The primary encounter diagnosis was Occipital neuralgia (Primary Area of Pain) (Bilateral) (R>L). Diagnoses of Chronic low back pain (Midline) (Secondary Area of Pain), Chronic lower extremity pain (Tertiary Area of Pain) (Bilateral) (R>L), Chronic hip pain (Tertiary Area of Pain) (Bilateral) (R>L), Chronic knee pain (Fourth Area of Pain) (Bilateral) (R>L), and Chronic shoulder pain (Fifth Area of Pain) (Bilateral) (R>L) were also pertinent to this visit.  Status Diagnosis  Controlled Controlled Controlled 1. Occipital neuralgia (Primary Area of Pain) (Bilateral) (R>L)   2. Chronic low back pain (Midline) (Secondary Area of Pain)   3. Chronic lower extremity pain (Tertiary Area of Pain) (Bilateral) (R>L)   4. Chronic hip pain (Tertiary Area of Pain) (Bilateral) (R>L)   5. Chronic knee pain (Fourth Area of Pain) (Bilateral) (R>L)   6. Chronic shoulder pain (Fifth  Area of Pain) (Bilateral) (R>L)     Problems updated and reviewed during this visit: No problems updated. Plan of Care  Pharmacotherapy (Medications Ordered): No orders of the defined types were placed in this encounter.  Medications administered today: Jean Gomez had no medications administered during this visit.  Procedure Orders    No procedure(s) ordered today   Lab Orders  No laboratory test(s) ordered today   Imaging Orders  No imaging studies ordered today   Referral Orders  No referral(s) requested today   Interventional management options: Planned, scheduled, and/or pending:   ***   Considering:   Diagnostic bilateral greater occipital nerve block #2 Possible bilateral occipital nerve RFA Possible bilateral occipital nerve peripheral nerves stimulator trial Diagnostic bilateral cervical facet block Possible bilateral cervical facet RFA Diagnostic right-sided cervical epidural steroid injection Diagnostic bilateral intra-articular shoulder joint injection Diagnostic bilateral suprascapular nerve block Possible bilateral suprascapular nerve RFA Diagnostic bilateral lumbar facet block Possible bilateral lumbar facet RFA Diagnostic right-sided lumbar epidural  steroid injection Diagnostic bilateral transforaminal epidural steroid injection Diagnostic bilateral intra-articular hip joint injection Diagnostic bilateral femoral nerve + obturator nerve block Possible bilateral femoral nerve + obturator nerve RFA Diagnostic bilateral intra-articular knee joint injection with local anesthetic and steroid Possible series of 5 bilateral intra-articular Hyalgan knee injections Diagnostic bilateral genicular nerve block Possible bilateral genicular nerve RFA   Palliative PRN treatment(s):   Palliative bilateral intra-articular Hyalgan knee injection series #2 (last one completed on 07/04/2018)    Provider-requested follow-up: No follow-ups on  file.  Future Appointments  Date Time Provider [place]  10/07/2018 11:45 AM Milinda Pointer, MD [facility] None  12/23/2018  9:30 AM Vevelyn Francois, NP [facility] None   Primary Care Physician: Jodi Marble, MD Location: [facility] Note by: Gaspar Cola, MD Date: 10/07/2018; Time: 5:47 AM

## 2018-10-07 ENCOUNTER — Ambulatory Visit: Payer: Self-pay | Admitting: Pain Medicine

## 2018-10-21 ENCOUNTER — Other Ambulatory Visit: Payer: Self-pay

## 2018-10-21 ENCOUNTER — Encounter: Payer: Self-pay | Admitting: Pain Medicine

## 2018-10-21 ENCOUNTER — Ambulatory Visit: Payer: Medicare Other | Attending: Pain Medicine | Admitting: Pain Medicine

## 2018-10-21 VITALS — BP 146/95 | HR 111 | Temp 98.5°F | Resp 18 | Ht 63.0 in | Wt 157.0 lb

## 2018-10-21 DIAGNOSIS — G4733 Obstructive sleep apnea (adult) (pediatric): Secondary | ICD-10-CM | POA: Insufficient documentation

## 2018-10-21 DIAGNOSIS — M19011 Primary osteoarthritis, right shoulder: Secondary | ICD-10-CM | POA: Insufficient documentation

## 2018-10-21 DIAGNOSIS — M4726 Other spondylosis with radiculopathy, lumbar region: Secondary | ICD-10-CM | POA: Diagnosis not present

## 2018-10-21 DIAGNOSIS — G8929 Other chronic pain: Secondary | ICD-10-CM | POA: Diagnosis not present

## 2018-10-21 DIAGNOSIS — G40909 Epilepsy, unspecified, not intractable, without status epilepticus: Secondary | ICD-10-CM | POA: Diagnosis not present

## 2018-10-21 DIAGNOSIS — Z7982 Long term (current) use of aspirin: Secondary | ICD-10-CM | POA: Diagnosis not present

## 2018-10-21 DIAGNOSIS — M5136 Other intervertebral disc degeneration, lumbar region: Secondary | ICD-10-CM | POA: Diagnosis not present

## 2018-10-21 DIAGNOSIS — Z9049 Acquired absence of other specified parts of digestive tract: Secondary | ICD-10-CM | POA: Diagnosis not present

## 2018-10-21 DIAGNOSIS — Z981 Arthrodesis status: Secondary | ICD-10-CM | POA: Insufficient documentation

## 2018-10-21 DIAGNOSIS — F319 Bipolar disorder, unspecified: Secondary | ICD-10-CM | POA: Insufficient documentation

## 2018-10-21 DIAGNOSIS — M545 Low back pain: Secondary | ICD-10-CM | POA: Insufficient documentation

## 2018-10-21 DIAGNOSIS — K219 Gastro-esophageal reflux disease without esophagitis: Secondary | ICD-10-CM | POA: Insufficient documentation

## 2018-10-21 DIAGNOSIS — M25511 Pain in right shoulder: Secondary | ICD-10-CM | POA: Insufficient documentation

## 2018-10-21 DIAGNOSIS — M19012 Primary osteoarthritis, left shoulder: Secondary | ICD-10-CM

## 2018-10-21 DIAGNOSIS — M5441 Lumbago with sciatica, right side: Secondary | ICD-10-CM | POA: Diagnosis not present

## 2018-10-21 DIAGNOSIS — R51 Headache: Secondary | ICD-10-CM

## 2018-10-21 DIAGNOSIS — M47816 Spondylosis without myelopathy or radiculopathy, lumbar region: Secondary | ICD-10-CM | POA: Diagnosis not present

## 2018-10-21 DIAGNOSIS — M5116 Intervertebral disc disorders with radiculopathy, lumbar region: Secondary | ICD-10-CM | POA: Diagnosis not present

## 2018-10-21 DIAGNOSIS — Z90710 Acquired absence of both cervix and uterus: Secondary | ICD-10-CM | POA: Insufficient documentation

## 2018-10-21 DIAGNOSIS — J449 Chronic obstructive pulmonary disease, unspecified: Secondary | ICD-10-CM | POA: Diagnosis not present

## 2018-10-21 DIAGNOSIS — Z79899 Other long term (current) drug therapy: Secondary | ICD-10-CM | POA: Diagnosis not present

## 2018-10-21 DIAGNOSIS — G4486 Cervicogenic headache: Secondary | ICD-10-CM | POA: Insufficient documentation

## 2018-10-21 DIAGNOSIS — I10 Essential (primary) hypertension: Secondary | ICD-10-CM | POA: Diagnosis not present

## 2018-10-21 DIAGNOSIS — M25512 Pain in left shoulder: Secondary | ICD-10-CM | POA: Diagnosis not present

## 2018-10-21 NOTE — Progress Notes (Signed)
Patient's Name: Jean Gomez  MRN: [id number]  Referring Provider: Jodi Marble, MD  DOB: March 30, 1956  PCP: Jodi Marble, MD  DOS: 10/21/2018  Note by: Gaspar Cola, MD  Service setting: Ambulatory outpatient  Specialty: Interventional Pain Management  Location[facility]    Patient type: Established   Primary Reason(s) for Visit: Encounter for post-procedure evaluation of chronic illness with mild to moderate exacerbation CC: Back Pain (low)  HPI  Jean Gomez is a 62 y.o. year old, female patient, who comes today for a post-procedure evaluation. She has Chronic pain syndrome; Pharmacologic therapy; Disorder of skeletal system; Problems influencing health status; Long term current use of opiate analgesic; Chronic low back pain (Bilateral) with right-sided sciatica (Secondary Area of Pain) (R>L); Chronic Jean extremity pain (Tertiary Area of Pain) (Bilateral) (R>L); Chronic knee pain (Fourth Area of Pain) (Bilateral) (R>L); Chronic shoulder pain (Fifth Area of Pain) (Bilateral) (R>L); Occipital neuralgia (Primary Area of Pain) (Bilateral) (R>L); Chronic neck pain (Bilateral) (R>L); Chronic sacroiliac joint pain (Right); Chronic migraine without aura; Chronic respiratory failure with hypoxia ([facility]); Constipation; Epilepsy (Jean Gomez); HTN, goal below 140/90; Essential tremor; Generalized anxiety disorder; H/O aneurysm; GERD (gastroesophageal reflux disease); History of depression; History of seizure disorder; Hypercholesterolemia; Insomnia; Irritable bowel syndrome; Major depression in remission ([facility]); Muscle spasm; Obstructive sleep apnea on CPAP; Osteoarthritis of knee (Bilateral) (R>L); COPD (chronic obstructive pulmonary disease) (Jean Gomez); Rotator cuff injury; Sarcoidosis; Spondylosis without myelopathy or radiculopathy, lumbosacral region; Vitamin D deficiency; Chronic shoulder pain (Right); Nonruptured cerebral aneurysm; Chronic headache disorder (Primary Area of  Pain); Pulmonary emphysema ([facility]); Seizure disorder ([facility]); Hiatal hernia; Occipital headache (Right); Frontal headache (Left); Chronic low back pain (Midline) (Secondary Area of Pain); Chronic lumbar radiculopathy (Right); Osteoarthritis involving multiple joints; Osteoarthritis of shoulder (Bilateral); Chronic musculoskeletal pain; Lumbar facet joint syndrome (Bilateral) (R>L); Chronic hip pain (Tertiary Area of Pain) (Bilateral) (R>L); DDD (degenerative disc disease), cervical; Cervical facet arthropathy (Bilateral); Cervical facet syndrome (Bilateral) (R>L); Cervical Grade 1 Anterolisthesis of C4 over C5 (Degenerative); Cervical Grade 1 Retrolisthesis of C5 over C6; Cervical foraminal stenosis (C3-4, C4-5, and C5-6) (Bilateral); DDD (degenerative disc disease), lumbar; Osteoarthritis of lumbar spine; Osteoarthritis of facet joint of lumbar spine; Spondylosis without myelopathy or radiculopathy, cervicothoracic region; Bipolar affective disorder, current episode manic (Jean Gomez); History of allergy to radiographic contrast media; Lumbar spondylosis; and Cervicogenic headache on their problem list. Her primarily concern today is the Back Pain (low)  Pain Assessment: Location: Jean Back Radiating: legs Onset: More than a month ago Duration: Chronic pain Quality: Throbbing, Constant Severity: 6 /10 (subjective, self-reported pain score)  Note: Reported level is inconsistent with clinical observations. Clinically the patient looks like a 2/10 A 2/10 is viewed as "Mild to Moderate" and described as noticeable and distracting. Impossible to hide from other people. More frequent flare-ups. Still possible to adapt and function close to normal. It can be very annoying and may have occasional stronger flare-ups. With discipline, patients may get used to it and adapt. Information on the proper use of the pain scale provided to the patient today. When using our objective Pain Scale, levels between 6 and 10/10 are said to  belong in an emergency room, as it progressively worsens from a 6/10, described as severely limiting, requiring emergency care not usually available at an outpatient pain management facility. At a 6/10 level, communication becomes difficult and requires great effort. Assistance to reach the emergency department may be required. Facial flushing and profuse sweating along with potentially  dangerous increases in heart rate and blood pressure will be evident. Timing: Constant Modifying factors: medications, rest,  BP: (!) 146/95  HR: (!) 111  Jean Gomez comes in today for post-procedure evaluation.  Further details on both, my assessment(s), as well as the proposed treatment plan, please see below.  Post-Procedure #2 Assessment  09/12/2018 Procedure: Diagnostic bilateral intra-articular shoulder joint injection #1 under fluoroscopic guidance and IV sedation Pre-procedure pain score:  9/10 Post-procedure pain score: 0/10 (100% relief) Influential Factors: BMI: 27.81 kg/m Intra-procedural challenges: None observed.         Assessment challenges: None detected.              Reported side-effects: None.        Post-procedural adverse reactions or complications: None reported         Sedation: Sedation provided. When no sedatives are used, the analgesic levels obtained are directly associated to the effectiveness of the local anesthetics. However, when sedation is provided, the level of analgesia obtained during the initial 1 hour following the intervention, is believed to be the result of a combination of factors. These factors may include, but are not limited to: 1. The effectiveness of the local anesthetics used. 2. The effects of the analgesic(s) and/or anxiolytic(s) used. 3. The degree of discomfort experienced by the patient at the time of the procedure. 4. The patients ability and reliability in recalling and recording the events. 5. The presence and influence of possible secondary gains  and/or psychosocial factors. Reported result: Relief experienced during the 1st hour after the procedure:  100% (Ultra-Short Term Relief)            Interpretative annotation: Clinically appropriate result. Analgesia during this period is likely to be Local Anesthetic and/or IV Sedative (Analgesic/Anxiolytic) related.          Effects of local anesthetic: The analgesic effects attained during this period are directly associated to the localized infiltration of local anesthetics and therefore cary significant diagnostic value as to the etiological location, or anatomical origin, of the pain. Expected duration of relief is directly dependent on the pharmacodynamics of the local anesthetic used. Long-acting (4-6 hours) anesthetics used.  Reported result: Relief during the next 4 to 6 hour after the procedure:  100% (Short-Term Relief)            Interpretative annotation: Clinically appropriate result. Analgesia during this period is likely to be Local Anesthetic-related.          Long-term benefit: Defined as the period of time past the expected duration of local anesthetics (1 hour for short-acting and 4-6 hours for long-acting). With the possible exception of prolonged sympathetic blockade from the local anesthetics, benefits during this period are typically attributed to, or associated with, other factors such as analgesic sensory neuropraxia, antiinflammatory effects, or beneficial biochemical changes provided by agents other than the local anesthetics.  Reported result: Extended relief following procedure:  50% (Long-Term Relief)            Interpretative annotation: Clinically possible results. Partial relief. No permanent benefit expected. Inflammation plays a part in the etiology to the pain.          Current benefits: Defined as reported results that persistent at this point in time.   Analgesia: 50-75 %            Function: Somewhat improved ROM: Somewhat improved Interpretative annotation:  Ongoing benefit. Therapeutic benefit observed. Effective therapeutic approach. Benefit could be steroid-related.  Interpretation: Results would suggest  a successful diagnostic intervention.                  Plan:  For now the patient is still getting some benefit. Should the pain began to return, we will consider a diagnostic suprascapular nerve block with the aim of possibly treating her with radiofrequency ablation.          Post-Procedure #1 Assessment  09/12/2018 Procedure: Diagnostic bilateral lumbar facet block #2 under fluoroscopic guidance and IV sedation Pre-procedure pain score:  8/10 Post-procedure pain score: 0/10 (100% relief) Influential Factors: BMI: 27.81 kg/m Intra-procedural challenges: None observed.         Assessment challenges: None detected.              Reported side-effects: None.        Post-procedural adverse reactions or complications: None reported         Sedation: Sedation provided. When no sedatives are used, the analgesic levels obtained are directly associated to the effectiveness of the local anesthetics. However, when sedation is provided, the level of analgesia obtained during the initial 1 hour following the intervention, is believed to be the result of a combination of factors. These factors may include, but are not limited to: 1. The effectiveness of the local anesthetics used. 2. The effects of the analgesic(s) and/or anxiolytic(s) used. 3. The degree of discomfort experienced by the patient at the time of the procedure. 4. The patients ability and reliability in recalling and recording the events. 5. The presence and influence of possible secondary gains and/or psychosocial factors. Reported result: Relief experienced during the 1st hour after the procedure:  100% (Ultra-Short Term Relief)            Interpretative annotation: Clinically appropriate result. Analgesia during this period is likely to be Local Anesthetic and/or IV Sedative  (Analgesic/Anxiolytic) related.          Effects of local anesthetic: The analgesic effects attained during this period are directly associated to the localized infiltration of local anesthetics and therefore cary significant diagnostic value as to the etiological location, or anatomical origin, of the pain. Expected duration of relief is directly dependent on the pharmacodynamics of the local anesthetic used. Long-acting (4-6 hours) anesthetics used.  Reported result: Relief during the next 4 to 6 hour after the procedure:  100% (Short-Term Relief)            Interpretative annotation: Clinically appropriate result. Analgesia during this period is likely to be Local Anesthetic-related.          Long-term benefit: Defined as the period of time past the expected duration of local anesthetics (1 hour for short-acting and 4-6 hours for long-acting). With the possible exception of prolonged sympathetic blockade from the local anesthetics, benefits during this period are typically attributed to, or associated with, other factors such as analgesic sensory neuropraxia, antiinflammatory effects, or beneficial biochemical changes provided by agents other than the local anesthetics.  Reported result: Extended relief following procedure:  0% (Long-Term Relief)            Interpretative annotation: Clinically possible results. Recurrence of symptoms. Therapeutic failure. No significant inflammatory component detected.          Current benefits: Defined as reported results that persistent at this point in time.   Analgesia: 0-25 %            Function: Back to baseline ROM: Back to baseline Interpretative annotation: Recurrence of symptoms. Therapeutic failure. Results would suggest persistent aggravating  factors.          Interpretation: Results would suggest a successful diagnostic intervention.                  Plan:  Proceed with diagnostic procedure No.: 2          Laboratory Chemistry  Inflammation  Markers (CRP: Acute Phase) (ESR: Chronic Phase) Lab Results  Component Value Date   CRP <0.8 02/07/2018   ESRSEDRATE 53 (H) 02/07/2018                         Rheumatology Markers No results found for: RF, ANA, LABURIC, URICUR, LYMEIGGIGMAB, LYMEABIGMQN, HLAB27                      Renal Markers Lab Results  Component Value Date   BUN 10 04/18/2018   CREATININE 0.60 04/18/2018   GFRAA >60 04/18/2018   GFRNONAA >60 04/18/2018                             Hepatic Markers Lab Results  Component Value Date   AST 31 04/17/2018   ALT 22 04/17/2018   ALBUMIN 4.9 04/17/2018                        Neuropathy Markers Lab Results  Component Value Date   VITAMINB12 268 02/07/2018   HGBA1C 5.9 (H) 04/18/2018                        Hematology Parameters Lab Results  Component Value Date   PLT 423 04/17/2018   HGB 10.8 (L) 04/17/2018   HCT 33.1 (L) 04/17/2018                        CV Markers No results found for: BNP, CKTOTAL, CKMB, TROPONINI                       Note: Lab results reviewed.  Recent Imaging Results   Results for orders placed in visit on 09/12/18  DG C-Arm 1-60 Min-No Report   Narrative Fluoroscopy was utilized by the requesting physician.  No radiographic  interpretation.    Interpretation Report: Fluoroscopy was used during the procedure to assist with needle guidance. The images were interpreted intraoperatively by the requesting physician.  Meds   Current Outpatient Medications:  .  Albuterol Sulfate 108 (90 Base) MCG/ACT AEPB, Inhale 2 puffs into the lungs every 6 (six) hours as needed., Disp: , Rfl:  .  amLODipine (NORVASC) 10 MG tablet, Take 1 tablet (10 mg total) by mouth daily., Disp: 30 tablet, Rfl: 0 .  aspirin EC 81 MG tablet, Take 1 tablet (81 mg total) by mouth daily., Disp: 30 tablet, Rfl: 0 .  atorvastatin (LIPITOR) 10 MG tablet, Take 1 tablet (10 mg total) by mouth daily., Disp: 30 tablet, Rfl: 0 .  betamethasone, augmented,  (DIPROLENE) 0.05 % lotion, Apply topically 2 (two) times daily., Disp: , Rfl:  .  clotrimazole-betamethasone (LOTRISONE) cream, Apply 1 application topically 2 (two) times daily., Disp: 30 g, Rfl: 0 .  diclofenac sodium (VOLTAREN) 1 % GEL, Apply 2 g topically 4 (four) times daily., Disp: 3 Tube, Rfl: 1 .  escitalopram (LEXAPRO) 20 MG tablet, Take 1 tablet (20 mg total) by mouth daily., Disp: 30 tablet,  Rfl: 0 .  hydrochlorothiazide (HYDRODIURIL) 25 MG tablet, Take 1 tablet (25 mg total) by mouth daily., Disp: 30 tablet, Rfl: 0 .  [START ON 11/18/2018] HYDROcodone-acetaminophen (NORCO) 10-325 MG tablet, Take 1 tablet by mouth every 8 (eight) hours as needed for severe pain., Disp: 90 tablet, Rfl: 0 .  HYDROcodone-acetaminophen (NORCO) 10-325 MG tablet, Take 1 tablet by mouth every 8 (eight) hours as needed for severe pain., Disp: 90 tablet, Rfl: 0 .  ipratropium (ATROVENT) 0.02 % nebulizer solution, Take 0.5 mg by nebulization every 4 (four) hours as needed for wheezing or shortness of breath., Disp: , Rfl:  .  levETIRAcetam (KEPPRA) 750 MG tablet, Take 1 tablet (750 mg total) by mouth daily., Disp: 30 tablet, Rfl: 0 .  lidocaine (XYLOCAINE) 5 % ointment, Apply 1 application topically 4 (four) times daily as needed for moderate pain., Disp: 35.44 g, Rfl: 2 .  losartan (COZAAR) 25 MG tablet, Take 1 tablet (25 mg total) by mouth daily., Disp: 30 tablet, Rfl: 0 .  nitroGLYCERIN (NITROSTAT) 0.4 MG SL tablet, Place 0.4 mg under the tongue every 5 (five) minutes as needed for chest pain., Disp: , Rfl:  .  pantoprazole (PROTONIX) 40 MG tablet, Take 1 tablet (40 mg total) by mouth daily., Disp: 30 tablet, Rfl: 0 .  potassium chloride SA (K-DUR,KLOR-CON) 20 MEQ tablet, Take 1 tablet (20 mEq total) by mouth 2 (two) times daily., Disp: 30 tablet, Rfl: 0 .  sucralfate (CARAFATE) 1 g tablet, Take 1 tablet (1 g total) by mouth 4 (four) times daily., Disp: 120 tablet, Rfl: 0 .  tiZANidine (ZANAFLEX) 4 MG tablet, Take  1 tablet (4 mg total) by mouth 3 (three) times daily., Disp: 90 tablet, Rfl: 1 .  torsemide (DEMADEX) 5 MG tablet, , Disp: , Rfl: 11 .  venlafaxine (EFFEXOR) 75 MG tablet, TK 1 T PO BID, Disp: , Rfl: 3 .  HYDROcodone-acetaminophen (NORCO) 10-325 MG tablet, Take 1 tablet by mouth every 6 (six) hours as needed for severe pain., Disp: 120 tablet, Rfl: 0  ROS  Constitutional: Denies any fever or chills Gastrointestinal: No reported hemesis, hematochezia, vomiting, or acute GI distress Musculoskeletal: Denies any acute onset joint swelling, redness, loss of ROM, or weakness Neurological: No reported episodes of acute onset apraxia, aphasia, dysarthria, agnosia, amnesia, paralysis, loss of coordination, or loss of consciousness  Allergies  Ms. Klinkner is allergic to contrast media [iodinated diagnostic agents]; gabapentin; labetalol; sulfabenzamide; fentanyl; and penicillins.  [place]  Drug: Ms. Kartes  reports that she does not use drugs. Alcohol:  reports that she drinks alcohol. Tobacco:  reports that she has never smoked. She has never used smokeless tobacco. Medical:  has a past medical history of Asthma, Cerebral aneurysm, Chest pain (11/01/2016), Chronic migraine, Chronic respiratory failure ([place]), COPD (chronic obstructive pulmonary disease) ([place]), Hypertension, Osteoarthritis, Sarcoid, and Seizures ([place]). Surgical: Ms. Weightman  has a past surgical history that includes Cholecystectomy; Abdominal hysterectomy; and Spinal fusion. Family: family history is not on file.  Constitutional Exam  General appearance: Well nourished, well developed, and well hydrated. In no apparent acute distress Vitals:   10/21/18 1332  BP: (!) 146/95  Pulse: (!) 111  Resp: 18  Temp: 98.5 F (36.9 C)  TempSrc: Oral  SpO2: 100%  Weight: 157 lb (71.2 kg)  Height: 5' 3" (1.6 m)   BMI Assessment: Estimated body mass index is 27.81 kg/m as calculated from the following:   Height as of this encounter: 5' 3" (1.6  m).  Weight as of this encounter: 157 lb (71.2 kg).  BMI interpretation table: BMI level Category Range association with higher incidence of chronic pain  <18 kg/m2 Underweight   18.5-24.9 kg/m2 Ideal body weight   25-29.9 kg/m2 Overweight Increased incidence by 20%  30-34.9 kg/m2 Obese (Class I) Increased incidence by 68%  35-39.9 kg/m2 Severe obesity (Class II) Increased incidence by 136%  >40 kg/m2 Extreme obesity (Class III) Increased incidence by 254%   Patient's current BMI Ideal Body weight  Body mass index is 27.81 kg/m. Ideal body weight: 52.4 kg (115 lb 8.3 oz) Adjusted ideal body weight: 59.9 kg (132 lb 1.8 oz)   BMI Readings from Last 4 Encounters:  10/21/18 27.81 kg/m  09/19/18 26.93 kg/m  09/12/18 28.15 kg/m  08/22/18 27.46 kg/m   Wt Readings from Last 4 Encounters:  10/21/18 157 lb (71.2 kg)  09/19/18 152 lb (68.9 kg)  09/12/18 164 lb (74.4 kg)  08/22/18 165 lb (74.8 kg)  Psych/Mental status: Alert, oriented x 3 (person, place, & time)       Eyes: PERLA Respiratory: No evidence of acute respiratory distress  Cervical Spine Area Exam  Skin & Axial Inspection: No masses, redness, edema, swelling, or associated skin lesions Alignment: Symmetrical Functional ROM: Unrestricted ROM      Stability: No instability detected Muscle Tone/Strength: Functionally intact. No obvious neuro-muscular anomalies detected. Sensory (Neurological): Unimpaired Palpation: No palpable anomalies              Upper Extremity (UE) Exam    Side: Right upper extremity  Side: Left upper extremity  Skin & Extremity Inspection: Skin color, temperature, and hair growth are WNL. No peripheral edema or cyanosis. No masses, redness, swelling, asymmetry, or associated skin lesions. No contractures.  Skin & Extremity Inspection: Skin color, temperature, and hair growth are WNL. No peripheral edema or cyanosis. No masses, redness, swelling, asymmetry, or associated skin lesions. No  contractures.  Functional ROM: Unrestricted ROM          Functional ROM: Unrestricted ROM          Muscle Tone/Strength: Functionally intact. No obvious neuro-muscular anomalies detected.  Muscle Tone/Strength: Functionally intact. No obvious neuro-muscular anomalies detected.  Sensory (Neurological): Unimpaired          Sensory (Neurological): Unimpaired          Palpation: No palpable anomalies              Palpation: No palpable anomalies              Provocative Test(s):  Phalen's test: deferred Tinel's test: deferred Apley's scratch test (touch opposite shoulder):  Action 1 (Across chest): deferred Action 2 (Overhead): deferred Action 3 (LB reach): deferred   Provocative Test(s):  Phalen's test: deferred Tinel's test: deferred Apley's scratch test (touch opposite shoulder):  Action 1 (Across chest): deferred Action 2 (Overhead): deferred Action 3 (LB reach): deferred    Thoracic Spine Area Exam  Skin & Axial Inspection: No masses, redness, or swelling Alignment: Symmetrical Functional ROM: Unrestricted ROM Stability: No instability detected Muscle Tone/Strength: Functionally intact. No obvious neuro-muscular anomalies detected. Sensory (Neurological): Unimpaired Muscle strength & Tone: No palpable anomalies  Lumbar Spine Area Exam  Skin & Axial Inspection: No masses, redness, or swelling Alignment: Symmetrical Functional ROM: Decreased ROM affecting both sides Stability: No instability detected Muscle Tone/Strength: Increased muscle tone over affected area Sensory (Neurological): Movement-associated pain Palpation: Complains of area being tender to palpation       Provocative Tests: Hyperextension/rotation  test: (+) bilaterally for facet joint pain. Lumbar quadrant test (Kemp's test): (+) bilaterally for facet joint pain. Lateral bending test: deferred today       Patrick's Maneuver: deferred today                   FABER test: deferred today                   S-I  anterior distraction/compression test: deferred today         S-I lateral compression test: deferred today         S-I Thigh-thrust test: deferred today         S-I Gaenslen's test: deferred today          Gait & Posture Assessment  Ambulation: Unassisted Gait: Relatively normal for age and body habitus Posture: WNL   Jean Extremity Exam    Side: Right Jean extremity  Side: Left Jean extremity  Stability: No instability observed          Stability: No instability observed          Skin & Extremity Inspection: Skin color, temperature, and hair growth are WNL. No peripheral edema or cyanosis. No masses, redness, swelling, asymmetry, or associated skin lesions. No contractures.  Skin & Extremity Inspection: Skin color, temperature, and hair growth are WNL. No peripheral edema or cyanosis. No masses, redness, swelling, asymmetry, or associated skin lesions. No contractures.  Functional ROM: Unrestricted ROM                  Functional ROM: Unrestricted ROM                  Muscle Tone/Strength: Functionally intact. No obvious neuro-muscular anomalies detected.  Muscle Tone/Strength: Functionally intact. No obvious neuro-muscular anomalies detected.  Sensory (Neurological): Unimpaired  Sensory (Neurological): Unimpaired  Palpation: No palpable anomalies  Palpation: No palpable anomalies   Assessment  Primary Diagnosis & Pertinent Problem List: The primary encounter diagnosis was Chronic low back pain (Midline) (Secondary Area of Pain). Diagnoses of Chronic shoulder pain (Fifth Area of Pain) (Bilateral) (R>L), DDD (degenerative disc disease), lumbar, Lumbar facet joint syndrome (Bilateral) (R>L), Lumbar spondylosis, and Osteoarthritis of shoulder (Bilateral) were also pertinent to this visit.  Status Diagnosis  Worsening Improved Stable 1. Chronic low back pain (Midline) (Secondary Area of Pain)   2. Chronic shoulder pain (Fifth Area of Pain) (Bilateral) (R>L)   3. DDD (degenerative disc  disease), lumbar   4. Lumbar facet joint syndrome (Bilateral) (R>L)   5. Lumbar spondylosis   6. Osteoarthritis of shoulder (Bilateral)     Problems updated and reviewed during this visit: Problem  Cervicogenic Headache   Plan of Care  Pharmacotherapy (Medications Ordered): No orders of the defined types were placed in this encounter.  Medications administered today: Wyoma F. Zenz had no medications administered during this visit.   Procedure Orders     LUMBAR FACET(MEDIAL BRANCH NERVE BLOCK) MBNB     LUMBAR FACET(MEDIAL BRANCH NERVE BLOCK) MBNB     SHOULDER INJECTION     SUPRASCAPULAR NERVE BLOCK Lab Orders  No laboratory test(s) ordered today   Imaging Orders  No imaging studies ordered today   Referral Orders  No referral(s) requested today   Interventional management options: Planned, scheduled, and/or pending:   Diagnostic bilateral lumbar facet block #2under fluoroscopic guidance and IV sedation   Considering:   Diagnostic bilateral greater occipital nerve block #2 Possible bilateral occipital nerve RFA Possible bilateral occipital  nerve peripheral nerves stimulator trial Diagnostic bilateral cervical facet block Possible bilateral cervical facet RFA Diagnostic right-sided cervical epidural steroid injection Diagnostic bilateral intra-articular shoulder joint injection Diagnostic bilateral suprascapular nerve block Possible bilateral suprascapular nerve RFA Diagnostic bilateral lumbar facet block Possible bilateral lumbar facet RFA Diagnostic right-sided lumbar epidural steroid injection Diagnostic bilateral transforaminal epidural steroid injection Diagnostic bilateral intra-articular hip joint injection Diagnostic bilateral femoral nerve + obturator nerve block Possible bilateral femoral nerve + obturator nerve RFA Diagnostic bilateral intra-articular knee joint injection with local anesthetic and steroid Possible series of 5  bilateral intra-articular Hyalgan knee injections(series #2) (series #1 completed on 07/04/2018) Diagnostic bilateral genicular nerve block Possible bilateral genicular nerve RFA   Palliative PRN treatment(s):   None at this time   Provider-requested follow-up: Return for Procedure (w/ sedation): (B) L-FCT BLK #2.  Future Appointments  Date Time Provider [place]  10/29/2018  9:30 AM Milinda Pointer, MD [facility] None  12/23/2018  9:30 AM Vevelyn Francois, NP [facility] None   Primary Care Physician: Jodi Marble, MD Location: [facility] Note by: Gaspar Cola, MD Date: 10/21/2018; Time: 2:29 PM

## 2018-10-21 NOTE — Patient Instructions (Addendum)
____________________________________________________________________________________________  Pain Scale  Introduction: The pain score used by this practice is the Verbal Numerical Rating Scale (VNRS-11). This is an 11-point scale. It is for adults and children 10 years or older. There are significant differences in how the pain score is reported, used, and applied. Forget everything you learned in the past and learn this scoring system.  General Information: The scale should reflect your current level of pain. Unless you are specifically asked for the level of your worst pain, or your average pain. If you are asked for one of these two, then it should be understood that it is over the past 24 hours.  Basic Activities of Daily Living (ADL): Personal hygiene, dressing, eating, transferring, and using restroom.  Instructions: Most patients tend to report their level of pain as a combination of two factors, their physical pain and their psychosocial pain. This last one is also known as "suffering" and it is reflection of how physical pain affects you socially and psychologically. From now on, report them separately. From this point on, when asked to report your pain level, report only your physical pain. Use the following table for reference.  Pain Clinic Pain Levels (0-5/10)  Pain Level Score  Description  No Pain 0   Mild pain 1 Nagging, annoying, but does not interfere with basic activities of daily living (ADL). Patients are able to eat, bathe, get dressed, toileting (being able to get on and off the toilet and perform personal hygiene functions), transfer (move in and out of bed or a chair without assistance), and maintain continence (able to control bladder and bowel functions). Blood pressure and heart rate are unaffected. A normal heart rate for a healthy adult ranges from 60 to 100 bpm (beats per minute).   Mild to moderate pain 2 Noticeable and distracting. Impossible to hide from other  people. More frequent flare-ups. Still possible to adapt and function close to normal. It can be very annoying and may have occasional stronger flare-ups. With discipline, patients may get used to it and adapt.   Moderate pain 3 Interferes significantly with activities of daily living (ADL). It becomes difficult to feed, bathe, get dressed, get on and off the toilet or to perform personal hygiene functions. Difficult to get in and out of bed or a chair without assistance. Very distracting. With effort, it can be ignored when deeply involved in activities.   Moderately severe pain 4 Impossible to ignore for more than a few minutes. With effort, patients may still be able to manage work or participate in some social activities. Very difficult to concentrate. Signs of autonomic nervous system discharge are evident: dilated pupils (mydriasis); mild sweating (diaphoresis); sleep interference. Heart rate becomes elevated (>115 bpm). Diastolic blood pressure (lower number) rises above 100 mmHg. Patients find relief in laying down and not moving.   Severe pain 5 Intense and extremely unpleasant. Associated with frowning face and frequent crying. Pain overwhelms the senses.  Ability to do any activity or maintain social relationships becomes significantly limited. Conversation becomes difficult. Pacing back and forth is common, as getting into a comfortable position is nearly impossible. Pain wakes you up from deep sleep. Physical signs will be obvious: pupillary dilation; increased sweating; goosebumps; brisk reflexes; cold, clammy hands and feet; nausea, vomiting or dry heaves; loss of appetite; significant sleep disturbance with inability to fall asleep or to remain asleep. When persistent, significant weight loss is observed due to the complete loss of appetite and sleep deprivation.  Blood   pressure and heart rate becomes significantly elevated. Caution: If elevated blood pressure triggers a pounding headache  associated with blurred vision, then the patient should immediately seek attention at an urgent or emergency care unit, as these may be signs of an impending stroke.    Emergency Department Pain Levels (6-10/10)  Emergency Room Pain 6 Severely limiting. Requires emergency care and should not be seen or managed at an outpatient pain management facility. Communication becomes difficult and requires great effort. Assistance to reach the emergency department may be required. Facial flushing and profuse sweating along with potentially dangerous increases in heart rate and blood pressure will be evident.   Distressing pain 7 Self-care is very difficult. Assistance is required to transport, or use restroom. Assistance to reach the emergency department will be required. Tasks requiring coordination, such as bathing and getting dressed become very difficult.   Disabling pain 8 Self-care is no longer possible. At this level, pain is disabling. The individual is unable to do even the most "basic" activities such as walking, eating, bathing, dressing, transferring to a bed, or toileting. Fine motor skills are lost. It is difficult to think clearly.   Incapacitating pain 9 Pain becomes incapacitating. Thought processing is no longer possible. Difficult to remember your own name. Control of movement and coordination are lost.   The worst pain imaginable 10 At this level, most patients pass out from pain. When this level is reached, collapse of the autonomic nervous system occurs, leading to a sudden drop in blood pressure and heart rate. This in turn results in a temporary and dramatic drop in blood flow to the brain, leading to a loss of consciousness. Fainting is one of the body's self defense mechanisms. Passing out puts the brain in a calmed state and causes it to shut down for a while, in order to begin the healing process.    Summary: 1. Refer to this scale when providing us with your pain level. 2. Be  accurate and careful when reporting your pain level. This will help with your care. 3. Over-reporting your pain level will lead to loss of credibility. 4. Even a level of 1/10 means that there is pain and will be treated at our facility. 5. High, inaccurate reporting will be documented as "Symptom Exaggeration", leading to loss of credibility and suspicions of possible secondary gains such as obtaining more narcotics, or wanting to appear disabled, for fraudulent reasons. 6. Only pain levels of 5 or below will be seen at our facility. 7. Pain levels of 6 and above will be sent to the Emergency Department and the appointment cancelled. ____________________________________________________________________________________________   ____________________________________________________________________________________________  Preparing for Procedure with Sedation  Instructions: . Oral Intake: Do not eat or drink anything for at least 8 hours prior to your procedure. . Transportation: Public transportation is not allowed. Bring an adult driver. The driver must be physically present in our waiting room before any procedure can be started. . Physical Assistance: Bring an adult physically capable of assisting you, in the event you need help. This adult should keep you company at home for at least 6 hours after the procedure. . Blood Pressure Medicine: Take your blood pressure medicine with a sip of water the morning of the procedure. . Blood thinners: Notify our staff if you are taking any blood thinners. Depending on which one you take, there will be specific instructions on how and when to stop it. . Diabetics on insulin: Notify the staff so that you can be   scheduled 1st case in the morning. If your diabetes requires high dose insulin, take only  of your normal insulin dose the morning of the procedure and notify the staff that you have done so. . Preventing infections: Shower with an antibacterial soap  the morning of your procedure. . Build-up your immune system: Take 1000 mg of Vitamin C with every meal (3 times a day) the day prior to your procedure. Marland Kitchen Antibiotics: Inform the staff if you have a condition or reason that requires you to take antibiotics before dental procedures. . Pregnancy: If you are pregnant, call and cancel the procedure. . Sickness: If you have a cold, fever, or any active infections, call and cancel the procedure. . Arrival: You must be in the facility at least 30 minutes prior to your scheduled procedure. . Children: Do not bring children with you. . Dress appropriately: Bring dark clothing that you would not mind if they get stained. . Valuables: Do not bring any jewelry or valuables.  Procedure appointments are reserved for interventional treatments only. Marland Kitchen No Prescription Refills. . No medication changes will be discussed during procedure appointments. . No disability issues will be discussed.  Reasons to call and reschedule or cancel your procedure: (Following these recommendations will minimize the risk of a serious complication.) . Surgeries: Avoid having procedures within 2 weeks of any surgery. (Avoid for 2 weeks before or after any surgery). . Flu Shots: Avoid having procedures within 2 weeks of a flu shots or . (Avoid for 2 weeks before or after immunizations). . Barium: Avoid having a procedure within 7-10 days after having had a radiological study involving the use of radiological contrast. (Myelograms, Barium swallow or enema study). . Heart attacks: Avoid any elective procedures or surgeries for the initial 6 months after a "Myocardial Infarction" (Heart Attack). . Blood thinners: It is imperative that you stop these medications before procedures. Let us know if you if you take any blood thinner.  . Infection: Avoid procedures during or within two weeks of an infection (including chest colds or gastrointestinal problems). Symptoms associated with  infections include: Localized redness, fever, chills, night sweats or profuse sweating, burning sensation when voiding, cough, congestion, stuffiness, runny nose, sore throat, diarrhea, nausea, vomiting, cold or Flu symptoms, recent or current infections. It is specially important if the infection is over the area that we intend to treat. Marland Kitchen Heart and lung problems: Symptoms that may suggest an active cardiopulmonary problem include: cough, chest pain, breathing difficulties or shortness of breath, dizziness, ankle swelling, uncontrolled high or unusually low blood pressure, and/or palpitations. If you are experiencing any of these symptoms, cancel your procedure and contact your primary care physician for an evaluation.  Remember:  Regular Business hours are:  Monday to Thursday 8:00 AM to 4:00 PM  Provider's Schedule: Milinda Pointer, MD:  Procedure days: Tuesday and Thursday 7:30 AM to 4:00 PM  Gillis Santa, MD:  Procedure days: Monday and Wednesday 7:30 AM to 4:00 PM ____________________________________________________________________________________________   GENERAL RISKS AND COMPLICATIONS  What are the risk, side effects and possible complications? Generally speaking, most procedures are safe.  However, with any procedure there are risks, side effects, and the possibility of complications.  The risks and complications are dependent upon the sites that are lesioned, or the type of nerve block to be performed.  The closer the procedure is to the spine, the more serious the risks are.  Great care is taken when placing the radio frequency needles, block needles or  lesioning probes, but sometimes complications can occur. 1. Infection: Any time there is an injection through the skin, there is a risk of infection.  This is why sterile conditions are used for these blocks.  There are four possible types of infection. 1. Localized skin infection. 2. Central Nervous System Infection-This can be  in the form of Meningitis, which can be deadly. 3. Epidural Infections-This can be in the form of an epidural abscess, which can cause pressure inside of the spine, causing compression of the spinal cord with subsequent paralysis. This would require an emergency surgery to decompress, and there are no guarantees that the patient would recover from the paralysis. 4. Discitis-This is an infection of the intervertebral discs.  It occurs in about 1% of discography procedures.  It is difficult to treat and it may lead to surgery.        2. Pain: the needles have to go through skin and soft tissues, will cause soreness.       3. Damage to internal structures:  The nerves to be lesioned may be near blood vessels or    other nerves which can be potentially damaged.       4. Bleeding: Bleeding is more common if the patient is taking blood thinners such as  aspirin, Coumadin, Ticiid, Plavix, etc., or if he/she have some genetic predisposition  such as hemophilia. Bleeding into the spinal canal can cause compression of the spinal  cord with subsequent paralysis.  This would require an emergency surgery to  decompress and there are no guarantees that the patient would recover from the  paralysis.       5. Pneumothorax:  Puncturing of a lung is a possibility, every time a needle is introduced in  the area of the chest or upper back.  Pneumothorax refers to free air around the  collapsed lung(s), inside of the thoracic cavity (chest cavity).  Another two possible  complications related to a similar event would include: Hemothorax and Chylothorax.   These are variations of the Pneumothorax, where instead of air around the collapsed  lung(s), you may have blood or chyle, respectively.       6. Spinal headaches: They may occur with any procedures in the area of the spine.       7. Persistent CSF (Cerebro-Spinal Fluid) leakage: This is a rare problem, but may occur  with prolonged intrathecal or epidural catheters either  due to the formation of a fistulous  track or a dural tear.       8. Nerve damage: By working so close to the spinal cord, there is always a possibility of  nerve damage, which could be as serious as a permanent spinal cord injury with  paralysis.       9. Death:  Although rare, severe deadly allergic reactions known as "Anaphylactic  reaction" can occur to any of the medications used.      10. Worsening of the symptoms:  We can always make thing worse.  What are the chances of something like this happening? Chances of any of this occuring are extremely low.  By statistics, you have more of a chance of getting killed in a motor vehicle accident: while driving to the hospital than any of the above occurring .  Nevertheless, you should be aware that they are possibilities.  In general, it is similar to taking a shower.  Everybody knows that you can slip, hit your head and get killed.  Does that mean  that you should not shower again?  Nevertheless always keep in mind that statistics do not mean anything if you happen to be on the wrong side of them.  Even if a procedure has a 1 (one) in a 1,000,000 (million) chance of going wrong, it you happen to be that one..Also, keep in mind that by statistics, you have more of a chance of having something go wrong when taking medications.  Who should not have this procedure? If you are on a blood thinning medication (e.g. Coumadin, Plavix, see list of "Blood Thinners"), or if you have an active infection going on, you should not have the procedure.  If you are taking any blood thinners, please inform your physician.  How should I prepare for this procedure?  Do not eat or drink anything at least six hours prior to the procedure.  Bring a driver with you .  It cannot be a taxi.  Come accompanied by an adult that can drive you back, and that is strong enough to help you if your legs get weak or numb from the local anesthetic.  Take all of your medicines the  morning of the procedure with just enough water to swallow them.  If you have diabetes, make sure that you are scheduled to have your procedure done first thing in the morning, whenever possible.  If you have diabetes, take only half of your insulin dose and notify our nurse that you have done so as soon as you arrive at the clinic.  If you are diabetic, but only take blood sugar pills (oral hypoglycemic), then do not take them on the morning of your procedure.  You may take them after you have had the procedure.  Do not take aspirin or any aspirin-containing medications, at least eleven (11) days prior to the procedure.  They may prolong bleeding.  Wear loose fitting clothing that may be easy to take off and that you would not mind if it got stained with Betadine or blood.  Do not wear any jewelry or perfume  Remove any nail coloring.  It will interfere with some of our monitoring equipment.  NOTE: Remember that this is not meant to be interpreted as a complete list of all possible complications.  Unforeseen problems may occur.  BLOOD THINNERS The following drugs contain aspirin or other products, which can cause increased bleeding during surgery and should not be taken for 2 weeks prior to and 1 week after surgery.  If you should need take something for relief of minor pain, you may take acetaminophen which is found in Tylenol,m Datril, Anacin-3 and Panadol. It is not blood thinner. The products listed below are.  Do not take any of the products listed below in addition to any listed on your instruction sheet.  A.P.C or A.P.C with Codeine Codeine Phosphate Capsules #3 Ibuprofen Ridaura  ABC compound Congesprin Imuran rimadil  Advil Cope Indocin Robaxisal  Alka-Seltzer Effervescent Pain Reliever and Antacid Coricidin or Coricidin-D  Indomethacin Rufen  Alka-Seltzer plus Cold Medicine Cosprin Ketoprofen S-A-C Tablets  Anacin Analgesic Tablets or Capsules Coumadin Korlgesic Salflex  Anacin  Extra Strength Analgesic tablets or capsules CP-2 Tablets Lanoril Salicylate  Anaprox Cuprimine Capsules Levenox Salocol  Anexsia-D Dalteparin Magan Salsalate  Anodynos Darvon compound Magnesium Salicylate Sine-off  Ansaid Dasin Capsules Magsal Sodium Salicylate  Anturane Depen Capsules Marnal Soma  APF Arthritis pain formula Dewitt's Pills Measurin Stanback  Argesic Dia-Gesic Meclofenamic Sulfinpyrazone  Arthritis Bayer Timed Release Aspirin Diclofenac Meclomen Sulindac  Arthritis pain formula Anacin Dicumarol Medipren Supac  Analgesic (Safety coated) Arthralgen Diffunasal Mefanamic Suprofen  Arthritis Strength Bufferin Dihydrocodeine Mepro Compound Suprol  Arthropan liquid Dopirydamole Methcarbomol with Aspirin Synalgos  ASA tablets/Enseals Disalcid Micrainin Tagament  Ascriptin Doan's Midol Talwin  Ascriptin A/D Dolene Mobidin Tanderil  Ascriptin Extra Strength Dolobid Moblgesic Ticlid  Ascriptin with Codeine Doloprin or Doloprin with Codeine Momentum Tolectin  Asperbuf Duoprin Mono-gesic Trendar  Aspergum Duradyne Motrin or Motrin IB Triminicin  Aspirin plain, buffered or enteric coated Durasal Myochrisine Trigesic  Aspirin Suppositories Easprin Nalfon Trillsate  Aspirin with Codeine Ecotrin Regular or Extra Strength Naprosyn Uracel  Atromid-S Efficin Naproxen Ursinus  Auranofin Capsules Elmiron Neocylate Vanquish  Axotal Emagrin Norgesic Verin  Azathioprine Empirin or Empirin with Codeine Normiflo Vitamin E  Azolid Emprazil Nuprin Voltaren  Bayer Aspirin plain, buffered or children's or timed BC Tablets or powders Encaprin Orgaran Warfarin Sodium  Buff-a-Comp Enoxaparin Orudis Zorpin  Buff-a-Comp with Codeine Equegesic Os-Cal-Gesic   Buffaprin Excedrin plain, buffered or Extra Strength Oxalid   Bufferin Arthritis Strength Feldene Oxphenbutazone   Bufferin plain or Extra Strength Feldene Capsules Oxycodone with Aspirin   Bufferin with Codeine Fenoprofen Fenoprofen Pabalate or  Pabalate-SF   Buffets II Flogesic Panagesic   Buffinol plain or Extra Strength Florinal or Florinal with Codeine Panwarfarin   Buf-Tabs Flurbiprofen Penicillamine   Butalbital Compound Four-way cold tablets Penicillin   Butazolidin Fragmin Pepto-Bismol   Carbenicillin Geminisyn Percodan   Carna Arthritis Reliever Geopen Persantine   Carprofen Gold's salt Persistin   Chloramphenicol Goody's Phenylbutazone   Chloromycetin Haltrain Piroxlcam   Clmetidine heparin Plaquenil   Cllnoril Hyco-pap Ponstel   Clofibrate Hydroxy chloroquine Propoxyphen         Before stopping any of these medications, be sure to consult the physician who ordered them.  Some, such as Coumadin (Warfarin) are ordered to prevent or treat serious conditions such as "deep thrombosis", "pumonary embolisms", and other heart problems.  The amount of time that you may need off of the medication may also vary with the medication and the reason for which you were taking it.  If you are taking any of these medications, please make sure you notify your pain physician before you undergo any procedures.         Facet Blocks Patient Information  Description: The facets are joints in the spine between the vertebrae.  Like any joints in the body, facets can become irritated and painful.  Arthritis can also effect the facets.  By injecting steroids and local anesthetic in and around these joints, we can temporarily block the nerve supply to them.  Steroids act directly on irritated nerves and tissues to reduce selling and inflammation which often leads to decreased pain.  Facet blocks may be done anywhere along the spine from the neck to the low back depending upon the location of your pain.   After numbing the skin with local anesthetic (like Novocaine), a small needle is passed onto the facet joints under x-ray guidance.  You may experience a sensation of pressure while this is being done.  The entire block usually lasts about 15-25  minutes.   Conditions which may be treated by facet blocks:   Low back/buttock pain  Neck/shoulder pain  Certain types of headaches  Preparation for the injection:  1. Do not eat any solid food or dairy products within 8 hours of your appointment. 2. You may drink clear liquid up to 3 hours before appointment.  Clear liquids include water,  black coffee, juice or soda.  No milk or cream please. 3. You may take your regular medication, including pain medications, with a sip of water before your appointment.  Diabetics should hold regular insulin (if taken separately) and take 1/2 normal NPH dose the morning of the procedure.  Carry some sugar containing items with you to your appointment. 4. A driver must accompany you and be prepared to drive you home after your procedure. 5. Bring all your current medications with you. 6. An IV may be inserted and sedation may be given at the discretion of the physician. 7. A blood pressure cuff, EKG and other monitors will often be applied during the procedure.  Some patients may need to have extra oxygen administered for a short period. 8. You will be asked to provide medical information, including your allergies and medications, prior to the procedure.  We must know immediately if you are taking blood thinners (like Coumadin/Warfarin) or if you are allergic to IV iodine contrast (dye).  We must know if you could possible be pregnant.  Possible side-effects:   Bleeding from needle site  Infection (rare, may require surgery)  Nerve injury (rare)  Numbness & tingling (temporary)  Difficulty urinating (rare, temporary)  Spinal headache (a headache worse with upright posture)  Light-headedness (temporary)  Pain at injection site (serveral days)  Decreased blood pressure (rare, temporary)  Weakness in arm/leg (temporary)  Pressure sensation in back/neck (temporary)   Call if you experience:   Fever/chills associated with headache or  increased back/neck pain  Headache worsened by an upright position  New onset, weakness or numbness of an extremity below the injection site  Hives or difficulty breathing (go to the emergency room)  Inflammation or drainage at the injection site(s)  Severe back/neck pain greater than usual  New symptoms which are concerning to you  Please note:  Although the local anesthetic injected can often make your back or neck feel good for several hours after the injection, the pain will likely return. It takes 3-7 days for steroids to work.  You may not notice any pain relief for at least one week.  If effective, we will often do a series of 2-3 injections spaced 3-6 weeks apart to maximally decrease your pain.  After the initial series, you may be a candidate for a more permanent nerve block of the facets.  If you have any questions, please [phone] [facility] Facet Joint Block The facet joints connect the bones of the spine (vertebrae). They make it possible for you to bend, twist, and make other movements with your spine. They also keep you from bending too far, twisting too far, and making other excessive movements. A facet joint block is a procedure where a numbing medicine (anesthetic) is injected into a facet joint. Often, a type of anti-inflammatory medicine called a steroid is also injected. A facet joint block may be done to diagnose neck or back pain. If the pain gets better after a facet joint block, it means the pain is probably coming from the facet joint. If the pain does not get better, it means the pain is probably not coming from the facet joint. A facet joint block may also be done to relieve neck or back pain caused by an inflamed facet joint. A facet joint block is only done to relieve pain if the pain does not improve with other methods, such as medicine, exercise programs, and physical therapy. Tell a  health care provider  about:  Any allergies you have.  All medicines you are taking, including vitamins, herbs, eye drops, creams, and over-the-counter medicines.  Any problems you or family members have had with anesthetic medicines.  Any blood disorders you have.  Any surgeries you have had.  Any medical conditions you have.  Whether you are pregnant or may be pregnant. What are the risks? Generally, this is a safe procedure. However, problems may occur, including:  Bleeding.  Injury to a nerve near the injection site.  Pain at the injection site.  Weakness or numbness in areas controlled by nerves near the injection site.  Infection.  Temporary fluid retention.  Allergic reactions to medicines or dyes.  Injury to other structures or organs near the injection site.  What happens before the procedure?  Follow instructions from your health care provider about eating or drinking restrictions.  Ask your health care provider about: ? Changing or stopping your regular medicines. This is especially important if you are taking diabetes medicines or blood thinners. ? Taking medicines such as aspirin and ibuprofen. These medicines can thin your blood. Do not take these medicines before your procedure if your health care provider instructs you not to.  Do not take any new dietary supplements or medicines without asking your health care provider first.  Plan to have someone take you home after the procedure. What happens during the procedure?  You may need to remove your clothing and dress in an open-back gown.  The procedure will be done while you are lying on an X-ray table. You will most likely be asked to lie on your stomach, but you may be asked to lie in a different position if an injection will be made in your neck.  Machines will be used to monitor your oxygen levels, heart rate, and blood pressure.  If an injection will be made in your neck, an IV tube will be inserted into one of your  veins. Fluids and medicine will flow directly into your body through the IV tube.  The area over the facet joint where the injection will be made will be cleaned with soap. The surrounding skin will be covered with clean drapes.  A numbing medicine (local anesthetic) will be applied to your skin. Your skin may sting or burn for a moment.  A video X-ray machine (fluoroscopy) will be used to locate the joint. In some cases, a CT scan may be used.  A contrast dye may be injected into the facet joint area to help locate the joint.  When the joint is located, an anesthetic will be injected into the joint through the needle.  Your health care provider will ask you whether you feel pain relief. If you do feel relief, a steroid may be injected to provide pain relief for a longer period of time. If you do not feel relief or feel only partial relief, additional injections of an anesthetic may be made in other facet joints.  The needle will be removed.  Your skin will be cleaned.  A bandage (dressing) will be applied over each injection site. The procedure may vary among health care providers and hospitals. What happens after the procedure?  You will be observed for 15-30 minutes before being allowed to go home. This information is not intended to replace advice given to you by your health care provider. Make sure you discuss any questions you have with your health care provider. Document Released: 04/25/2007 Document Revised: 01/05/2016 Document  Reviewed: 08/30/2015 Elsevier Interactive Patient Education  [employer].

## 2018-10-21 NOTE — Progress Notes (Signed)
Safety precautions to be maintained throughout the outpatient stay will include: orient to surroundings, keep bed in low position, maintain call bell within reach at all times, provide assistance with transfer out of bed and ambulation.  

## 2018-10-29 ENCOUNTER — Ambulatory Visit (HOSPITAL_BASED_OUTPATIENT_CLINIC_OR_DEPARTMENT_OTHER): Payer: Medicare Other | Admitting: Pain Medicine

## 2018-10-29 ENCOUNTER — Ambulatory Visit
Admission: RE | Admit: 2018-10-29 | Discharge: 2018-10-29 | Disposition: A | Discharge status: Home / Self Care | Payer: Medicare Other | Source: Ambulatory Visit | Attending: Pain Medicine | Admitting: Pain Medicine

## 2018-10-29 ENCOUNTER — Other Ambulatory Visit: Payer: Self-pay

## 2018-10-29 ENCOUNTER — Encounter: Payer: Self-pay | Admitting: Pain Medicine

## 2018-10-29 VITALS — BP 121/81 | HR 93 | Temp 97.6°F | Resp 16 | Ht 63.0 in | Wt 155.0 lb

## 2018-10-29 DIAGNOSIS — M47816 Spondylosis without myelopathy or radiculopathy, lumbar region: Secondary | ICD-10-CM

## 2018-10-29 DIAGNOSIS — F419 Anxiety disorder, unspecified: Secondary | ICD-10-CM | POA: Diagnosis not present

## 2018-10-29 DIAGNOSIS — Z981 Arthrodesis status: Secondary | ICD-10-CM | POA: Diagnosis not present

## 2018-10-29 DIAGNOSIS — G8929 Other chronic pain: Secondary | ICD-10-CM | POA: Insufficient documentation

## 2018-10-29 DIAGNOSIS — M5441 Lumbago with sciatica, right side: Secondary | ICD-10-CM

## 2018-10-29 DIAGNOSIS — Z880 Allergy status to penicillin: Secondary | ICD-10-CM | POA: Diagnosis not present

## 2018-10-29 DIAGNOSIS — Z91041 Radiographic dye allergy status: Secondary | ICD-10-CM | POA: Diagnosis not present

## 2018-10-29 DIAGNOSIS — Z7982 Long term (current) use of aspirin: Secondary | ICD-10-CM | POA: Insufficient documentation

## 2018-10-29 DIAGNOSIS — Z888 Allergy status to other drugs, medicaments and biological substances status: Secondary | ICD-10-CM | POA: Diagnosis not present

## 2018-10-29 DIAGNOSIS — Z79899 Other long term (current) drug therapy: Secondary | ICD-10-CM | POA: Insufficient documentation

## 2018-10-29 DIAGNOSIS — Z9049 Acquired absence of other specified parts of digestive tract: Secondary | ICD-10-CM | POA: Diagnosis not present

## 2018-10-29 DIAGNOSIS — Z90710 Acquired absence of both cervix and uterus: Secondary | ICD-10-CM | POA: Diagnosis not present

## 2018-10-29 DIAGNOSIS — Z79891 Long term (current) use of opiate analgesic: Secondary | ICD-10-CM | POA: Diagnosis not present

## 2018-10-29 DIAGNOSIS — M47817 Spondylosis without myelopathy or radiculopathy, lumbosacral region: Secondary | ICD-10-CM | POA: Insufficient documentation

## 2018-10-29 DIAGNOSIS — M5116 Intervertebral disc disorders with radiculopathy, lumbar region: Secondary | ICD-10-CM | POA: Diagnosis not present

## 2018-10-29 DIAGNOSIS — M5136 Other intervertebral disc degeneration, lumbar region: Secondary | ICD-10-CM | POA: Diagnosis not present

## 2018-10-29 MED ORDER — TRIAMCINOLONE ACETONIDE 40 MG/ML IJ SUSP
80.0000 mg | Freq: Once | INTRAMUSCULAR | Status: AC
  Administered 2018-10-29: 40 mg
  Filled 2018-10-29: qty 2

## 2018-10-29 MED ORDER — LACTATED RINGERS IV SOLN
1000.0000 mL | Freq: Once | INTRAVENOUS | Status: AC
  Administered 2018-10-29: 1000 mL via INTRAVENOUS

## 2018-10-29 MED ORDER — LIDOCAINE HCL 2 % IJ SOLN
20.0000 mL | Freq: Once | INTRAMUSCULAR | Status: AC
  Administered 2018-10-29: 400 mg
  Filled 2018-10-29: qty 20

## 2018-10-29 MED ORDER — ROPIVACAINE HCL 2 MG/ML IJ SOLN
18.0000 mL | Freq: Once | INTRAMUSCULAR | Status: AC
  Administered 2018-10-29: 10 mL via PERINEURAL
  Filled 2018-10-29: qty 20

## 2018-10-29 MED ORDER — MIDAZOLAM HCL 5 MG/5ML IJ SOLN
1.0000 mg | INTRAMUSCULAR | Status: DC | PRN
  Administered 2018-10-29: 4 mg via INTRAVENOUS
  Filled 2018-10-29: qty 5

## 2018-10-29 NOTE — Patient Instructions (Signed)

## 2018-10-29 NOTE — Progress Notes (Signed)
Safety precautions to be maintained throughout the outpatient stay will include: orient to surroundings, keep bed in low position, maintain call bell within reach at all times, provide assistance with transfer out of bed and ambulation.  

## 2018-10-29 NOTE — Progress Notes (Signed)
Patient's Name: Jean Gomez  MRN: [id number]  Referring Provider: Sherron Monday, MD  DOB: 03-04-1956  PCP: Sherron Monday, MD  DOS: 10/29/2018  Note by: Oswaldo Done, MD  Service setting: Ambulatory outpatient  Specialty: Interventional Pain Management  Patient type: Established  Location: [facility] (AMB) Pain Management Facility  Visit type: Interventional Procedure   Primary Reason for Visit: Interventional Pain Management Treatment. CC: Procedure  Procedure:          Anesthesia, Analgesia, Anxiolysis:  Type: Lumbar Facet, Medial Branch Block(s) #3  Primary Purpose: Diagnostic Region: Posterolateral Lumbosacral Spine Level: L2, L3, L4, L5, & S1 Medial Branch Level(s). Injecting these levels blocks the L3-4, L4-5, and L5-S1 lumbar facet joints. Laterality: Bilateral  Type: Moderate (Conscious) Sedation combined with Local Anesthesia Indication(s): Analgesia and Anxiety Route: Intravenous (IV) IV Access: Secured Sedation: Meaningful verbal contact was maintained at all times during the procedure  Local Anesthetic: Lidocaine 1-2%  Position: Prone   Indications: 1. Spondylosis without myelopathy or radiculopathy, lumbosacral region   2. 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 Lumbar spondylosis   4. DDD (degenerative disc disease), lumbar   5. Osteoarthritis of facet joint of lumbar spine   6. 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 (year old), female patient, seen today for interventional treatment. She  has a past surgical history that includes Cholecystectomy; Abdominal hysterectomy; and Spinal fusion. Jean Gomez has a current medication list which includes the following prescription(s): albuterol sulfate, amlodipine, aspirin ec, atorvastatin, betamethasone (augmented), clotrimazole-betamethasone,  diclofenac sodium, escitalopram, hydrochlorothiazide, hydrocodone-acetaminophen, hydrocodone-acetaminophen, ipratropium, levetiracetam, lidocaine, losartan, nitroglycerin, pantoprazole, potassium chloride sa, sucralfate, tizanidine, torsemide, venlafaxine, and hydrocodone-acetaminophen, and the following Facility-Administered Medications: lactated ringers and midazolam. Her primarily concern today is the Procedure  Initial Vital Signs:  Pulse/HCG Rate: 93ECG Heart Rate: 85 Temp: 98.9 F (37.2 C) Resp: 18 BP: 135/81 SpO2: 96 %  BMI: Estimated body mass index is 27.46 kg/m as calculated from the following:   Height as of this encounter: 5\' 3"  (1.6 m).   Weight as of this encounter: 155 lb (70.3 kg).  Risk Assessment: Allergies: Reviewed. She is allergic to contrast media [iodinated diagnostic agents]; gabapentin; labetalol; sulfabenzamide; fentanyl; and penicillins.  Allergy Precautions: No fentanyl included in the sedation. Coagulopathies: Reviewed. None identified.  Blood-thinner therapy: None at this time Active Infection(s): Reviewed. None identified. Jean Gomez is afebrile  Site Confirmation: Jean Gomez was asked to confirm the procedure and laterality before marking the site Procedure checklist: Completed Consent: Before the procedure and under the influence of no sedative(s), amnesic(s), or anxiolytics, the patient was informed of the treatment options, risks and possible complications. To fulfill our ethical and legal obligations, as recommended by the American Medical Association's Code of Ethics, I have informed the patient of my clinical impression; the nature and purpose of the treatment or procedure; the risks, benefits, and possible complications of the intervention; the alternatives, including doing nothing; the risk(s) and benefit(s) of the alternative treatment(s) or procedure(s); and the risk(s) and benefit(s) of doing nothing. The patient was provided information about the  general risks and possible complications associated with the procedure. These may include, but are not limited to: failure to achieve desired goals, infection, bleeding, organ or nerve damage, allergic reactions, paralysis, and death. In addition, the patient was informed of those risks and  complications associated to Spine-related procedures, such as failure to decrease pain; infection (i.e.: Meningitis, epidural or intraspinal abscess); bleeding (i.e.: epidural hematoma, subarachnoid hemorrhage, or any other type of intraspinal or peri-dural bleeding); organ or nerve damage (i.e.: Any type of peripheral nerve, nerve root, or spinal cord injury) with subsequent damage to sensory, motor, and/or autonomic systems, resulting in permanent pain, numbness, and/or weakness of one or several areas of the body; allergic reactions; (i.e.: anaphylactic reaction); and/or death. Furthermore, the patient was informed of those risks and complications associated with the medications. These include, but are not limited to: allergic reactions (i.e.: anaphylactic or anaphylactoid reaction(s)); adrenal axis suppression; blood sugar elevation that in diabetics may result in ketoacidosis or comma; water retention that in patients with history of congestive heart failure may result in shortness of breath, pulmonary edema, and decompensation with resultant heart failure; weight gain; swelling or edema; medication-induced neural toxicity; particulate matter embolism and blood vessel occlusion with resultant organ, and/or nervous system infarction; and/or aseptic necrosis of one or more joints. Finally, the patient was informed that Medicine is not an exact science; therefore, there is also the possibility of unforeseen or unpredictable risks and/or possible complications that may result in a catastrophic outcome. The patient indicated having understood very clearly. We have given the patient no guarantees and we have made no promises.  Enough time was given to the patient to ask questions, all of which were answered to the patient's satisfaction. Jean Gomez has indicated that she wanted to continue with the procedure. Attestation: I, the ordering provider, attest that I have discussed with the patient the benefits, risks, side-effects, alternatives, likelihood of achieving goals, and potential problems during recovery for the procedure that I have provided informed consent. Date  Time: 10/29/2018  9:39 AM  Pre-Procedure Preparation:  Monitoring: As per clinic protocol. Respiration, ETCO2, SpO2, BP, heart rate and rhythm monitor placed and checked for adequate function Safety Precautions: Patient was assessed for positional comfort and pressure points before starting the procedure. Time-out: I initiated and conducted the "Time-out" before starting the procedure, as per protocol. The patient was asked to participate by confirming the accuracy of the "Time Out" information. Verification of the correct person, site, and procedure were performed and confirmed by me, the nursing staff, and the patient. "Time-out" conducted as per Joint Commission's Universal Protocol (UP.01.01.01). Time: 1018  Description of Procedure:          Laterality: Bilateral. The procedure was performed in identical fashion on both sides. Levels:  L2, L3, L4, L5, & S1 Medial Branch Level(s) Area Prepped: Posterior Lumbosacral Region Prepping solution: ChloraPrep (2% chlorhexidine gluconate and 70% isopropyl alcohol) Safety Precautions: Aspiration looking for blood return was conducted prior to all injections. At no point did we inject any substances, as a needle was being advanced. Before injecting, the patient was told to immediately notify me if she was experiencing any new onset of "ringing in the ears, or metallic taste in the mouth". No attempts were made at seeking any paresthesias. Safe injection practices and needle disposal techniques used. Medications  properly checked for expiration dates. SDV (single dose vial) medications used. After the completion of the procedure, all disposable equipment used was discarded in the proper designated medical waste containers. Local Anesthesia: Protocol guidelines were followed. The patient was positioned over the fluoroscopy table. The area was prepped in the usual manner. The time-out was completed. The target area was identified using fluoroscopy. A 12-in long, straight, sterile hemostat was used  with fluoroscopic guidance to locate the targets for each level blocked. Once located, the skin was marked with an approved surgical skin marker. Once all sites were marked, the skin (epidermis, dermis, and hypodermis), as well as deeper tissues (fat, connective tissue and muscle) were infiltrated with a small amount of a short-acting local anesthetic, loaded on a 10cc syringe with a 25G, 1.5-in  Needle. An appropriate amount of time was allowed for local anesthetics to take effect before proceeding to the next step. Local Anesthetic: Lidocaine 2.0% The unused portion of the local anesthetic was discarded in the proper designated containers. Technical explanation of process:  L2 Medial Branch Nerve Block (MBB): The target area for the L2 medial branch is at the junction of the postero-lateral aspect of the superior articular process and the superior, posterior, and medial edge of the transverse process of L3. Under fluoroscopic guidance, a Quincke needle was inserted until contact was made with os over the superior postero-lateral aspect of the pedicular shadow (target area). After negative aspiration for blood, 0.5 mL of the nerve block solution was injected without difficulty or complication. The needle was removed intact. L3 Medial Branch Nerve Block (MBB): The target area for the L3 medial branch is at the junction of the postero-lateral aspect of the superior articular process and the superior, posterior, and medial edge of  the transverse process of L4. Under fluoroscopic guidance, a Quincke needle was inserted until contact was made with os over the superior postero-lateral aspect of the pedicular shadow (target area). After negative aspiration for blood, 0.5 mL of the nerve block solution was injected without difficulty or complication. The needle was removed intact. L4 Medial Branch Nerve Block (MBB): The target area for the L4 medial branch is at the junction of the postero-lateral aspect of the superior articular process and the superior, posterior, and medial edge of the transverse process of L5. Under fluoroscopic guidance, a Quincke needle was inserted until contact was made with os over the superior postero-lateral aspect of the pedicular shadow (target area). After negative aspiration for blood, 0.5 mL of the nerve block solution was injected without difficulty or complication. The needle was removed intact. L5 Medial Branch Nerve Block (MBB): The target area for the L5 medial branch is at the junction of the postero-lateral aspect of the superior articular process and the superior, posterior, and medial edge of the sacral ala. Under fluoroscopic guidance, a Quincke needle was inserted until contact was made with os over the superior postero-lateral aspect of the pedicular shadow (target area). After negative aspiration for blood, 0.5 mL of the nerve block solution was injected without difficulty or complication. The needle was removed intact. S1 Medial Branch Nerve Block (MBB): The target area for the S1 medial branch is at the posterior and inferior 6 o'clock position of the L5-S1 facet joint. Under fluoroscopic guidance, the Quincke needle inserted for the L5 MBB was redirected until contact was made with os over the inferior and postero aspect of the sacrum, at the 6 o' clock position under the L5-S1 facet joint (Target area). After negative aspiration for blood, 0.5 mL of the nerve block solution was injected without  difficulty or complication. The needle was removed intact. Procedural Needles: 22-gauge, 3.5-inch, Quincke needles used for all levels. Nerve block solution: 0.2% PF-Ropivacaine + Triamcinolone (40 mg/mL) diluted to a final concentration of 4 mg of Triamcinolone/mL of Ropivacaine The unused portion of the solution was discarded in the proper designated containers.  Once the entire  procedure was completed, the treated area was cleaned, making sure to leave some of the prepping solution back to take advantage of its long term bactericidal properties.   Illustration of the posterior view of the lumbar spine and the posterior neural structures. Laminae of L2 through S1 are labeled. DPRL5, dorsal primary ramus of L5; DPRS1, dorsal primary ramus of S1; DPR3, dorsal primary ramus of L3; FJ, facet (zygapophyseal) joint L3-L4; I, inferior articular process of L4; LB1, lateral branch of dorsal primary ramus of L1; IAB, inferior articular branches from L3 medial branch (supplies L4-L5 facet joint); IBP, intermediate branch plexus; MB3, medial branch of dorsal primary ramus of L3; NR3, third lumbar nerve root; S, superior articular process of L5; SAB, superior articular branches from L4 (supplies L4-5 facet joint also); TP3, transverse process of L3.  Vitals:   10/29/18 1030 10/29/18 1037 10/29/18 1047 10/29/18 1055  BP: 104/80 113/75 126/78 121/81  Pulse:      Resp: 16 18 20 16   Temp:  (!) 97.5 F (36.4 C)  97.6 F (36.4 C)  TempSrc:  Temporal  Temporal  SpO2: 99% 93% 94% 96%  Weight:      Height:         Start Time: 1019 hrs. End Time: 1028 hrs.  Imaging Guidance (Spinal):          Type of Imaging Technique: Fluoroscopy Guidance (Spinal) Indication(s): Assistance in needle guidance and placement for procedures requiring needle placement in or near specific anatomical locations not easily accessible without such assistance. Exposure Time: Please see nurses notes. Contrast: None used. Fluoroscopic  Guidance: I was personally present during the use of fluoroscopy. "Tunnel Vision Technique" used to obtain the best possible view of the target area. Parallax error corrected before commencing the procedure. "Direction-depth-direction" technique used to introduce the needle under continuous pulsed fluoroscopy. Once target was reached, antero-posterior, oblique, and lateral fluoroscopic projection used confirm needle placement in all planes. Images permanently stored in EMR. Interpretation: No contrast injected. I personally interpreted the imaging intraoperatively. Adequate needle placement confirmed in multiple planes. Permanent images saved into the patient's record.  Antibiotic Prophylaxis:   Anti-infectives (From admission, onward)   None     Indication(s): None identified  Post-operative Assessment:  Post-procedure Vital Signs:  Pulse/HCG Rate: 9389 Temp: 97.6 F (36.4 C) Resp: 16 BP: 121/81 SpO2: 96 %  EBL: None  Complications: No immediate post-treatment complications observed by team, or reported by patient.  Note: The patient tolerated the entire procedure well. A repeat set of vitals were taken after the procedure and the patient was kept under observation following institutional policy, for this type of procedure. Post-procedural neurological assessment was performed, showing return to baseline, prior to discharge. The patient was provided with post-procedure discharge instructions, including a section on how to identify potential problems. Should any problems arise concerning this procedure, the patient was given instructions to immediately contact us, at any time, without hesitation. In any case, we plan to contact the patient by telephone for a follow-up status report regarding this interventional procedure.  Comments:  No additional relevant information.  Plan of Care    Imaging Orders     DG C-Arm 1-60 Min-No Report  Procedure Orders     LUMBAR FACET(MEDIAL BRANCH  NERVE BLOCK) MBNB  Medications ordered for procedure: Meds ordered this encounter  Medications  . lidocaine (XYLOCAINE) 2 % (with pres) injection 400 mg  . midazolam (VERSED) 5 MG/5ML injection 1-2 mg    Make sure Flumazenil  is available in the pyxis when using this medication. If oversedation occurs, administer 0.2 mg IV over 15 sec. If after 45 sec no response, administer 0.2 mg again over 1 min; may repeat at 1 min intervals; not to exceed 4 doses (1 mg)  . lactated ringers infusion 1,000 mL  . ropivacaine (PF) 2 mg/mL (0.2%) (NAROPIN) injection 18 mL  . triamcinolone acetonide (KENALOG-40) injection 80 mg   Medications administered: We administered lidocaine, midazolam, lactated ringers, ropivacaine (PF) 2 mg/mL (0.2%), and triamcinolone acetonide.  See the medical record for exact dosing, route, and time of administration.  Disposition: Discharge home  Discharge Date & Time: 10/29/2018; 1058 hrs.   Physician-requested Follow-up: Return for post-procedure eval (2 wks), w/ Dr. Laban Emperor.  Future Appointments  Date Time Provider Department Center  11/18/2018 11:15 AM Delano Metz, MD [facility] None  12/23/2018  9:30 AM Barbette Merino, NP [facility] None   Primary Care Physician: Sherron Monday, MD Location: [facility] Note by: Oswaldo Done, MD Date: 10/29/2018; Time: 11:06 AM  Disclaimer:  Medicine is not an Visual merchandiser. The only guarantee in medicine is that nothing is guaranteed. It is important to note that the decision to proceed with this intervention was based on the information collected from the patient. The Data and conclusions were drawn from the patient's questionnaire, the interview, and the physical examination. Because the information was provided in large part by the patient, it cannot be guaranteed that it has not been purposely or unconsciously manipulated. Every effort has been made to obtain as much relevant data as  possible for this evaluation. It is important to note that the conclusions that lead to this procedure are derived in large part from the available data. Always take into account that the treatment will also be dependent on availability of resources and existing treatment guidelines, considered by other Pain Management Practitioners as being common knowledge and practice, at the time of the intervention. For Medico-Legal purposes, it is also important to point out that variation in procedural techniques and pharmacological choices are the acceptable norm. The indications, contraindications, technique, and results of the above procedure should only be interpreted and judged by a Board-Certified Interventional Pain Specialist with extensive familiarity and expertise in the same exact procedure and technique.

## 2018-10-31 ENCOUNTER — Telehealth: Payer: Self-pay | Admitting: *Deleted

## 2018-10-31 NOTE — Telephone Encounter (Signed)
No problems post procedure. 

## 2018-11-15 ENCOUNTER — Other Ambulatory Visit: Payer: Self-pay | Admitting: Pain Medicine

## 2018-11-15 DIAGNOSIS — M7918 Myalgia, other site: Secondary | ICD-10-CM

## 2018-11-15 DIAGNOSIS — G8929 Other chronic pain: Secondary | ICD-10-CM

## 2018-11-15 DIAGNOSIS — M5441 Lumbago with sciatica, right side: Principal | ICD-10-CM

## 2018-11-17 NOTE — Progress Notes (Deleted)
Patient's Name: Jean Gomez  MRN: [id number]  Referring Provider: Jodi Marble, MD  DOB: Sep 01, 1956  PCP: Jodi Marble, MD  DOS: 11/18/2018  Note by: Gaspar Cola, MD  Service setting: Ambulatory outpatient  Specialty: Interventional Pain Management  Location: [facility] (AMB) Pain Management Facility    Patient type: Established   Primary Reason(s) for Visit: Encounter for post-procedure evaluation of chronic illness with mild to moderate exacerbation CC: No chief complaint on file.  HPI  Jean Gomez is a 62 y.o. year old, female patient, who comes today for a post-procedure evaluation. She has Chronic pain syndrome; Pharmacologic therapy; Disorder of skeletal system; Problems influencing health status; Long term current use of opiate analgesic; Chronic low back pain (Secondary Area of Pain) (Bilateral) (R>L) w/ sciatica (Right); Chronic lower extremity pain (Tertiary Area of Pain) (Bilateral) (R>L); Chronic knee pain (Fourth Area of Pain) (Bilateral) (R>L); Chronic shoulder pain (Fifth Area of Pain) (Bilateral) (R>L); Occipital neuralgia (Primary Area of Pain) (Bilateral) (R>L); Chronic neck pain (Bilateral) (R>L); Chronic sacroiliac joint pain (Right); Chronic migraine without aura; Chronic respiratory failure with hypoxia ([place]); Constipation; Epilepsy ([place]); HTN, goal below 140/90; Essential tremor; Generalized anxiety disorder; H/O aneurysm; GERD (gastroesophageal reflux disease); History of depression; History of seizure disorder; Hypercholesterolemia; Insomnia; Irritable bowel syndrome; Major depression in remission ([place]); Muscle spasm; Obstructive sleep apnea on CPAP; Osteoarthritis of knee (Bilateral) (R>L); COPD (chronic obstructive pulmonary disease) ([place]); Rotator cuff injury; Sarcoidosis; Spondylosis without myelopathy or radiculopathy, lumbosacral region; Vitamin D deficiency; Chronic shoulder pain (Right); Nonruptured cerebral aneurysm; Chronic headache disorder (Primary  Area of Pain); Pulmonary emphysema ([place]); Seizure disorder ([place]); Hiatal hernia; Occipital headache (Right); Frontal headache (Left); Chronic low back pain (Midline) (Secondary Area of Pain); Chronic lumbar radiculopathy (Right); Osteoarthritis involving multiple joints; Osteoarthritis of shoulder (Bilateral); Chronic musculoskeletal pain; Lumbar facet joint syndrome (Bilateral) (R>L); Chronic hip pain (Tertiary Area of Pain) (Bilateral) (R>L); DDD (degenerative disc disease), cervical; Cervical facet arthropathy (Bilateral); Cervical facet syndrome (Bilateral) (R>L); Cervical Grade 1 Anterolisthesis of C4 over C5 (Degenerative); Cervical Grade 1 Retrolisthesis of C5 over C6; Cervical foraminal stenosis (C3-4, C4-5, and C5-6) (Bilateral); DDD (degenerative disc disease), lumbar; Osteoarthritis of lumbar spine; Osteoarthritis of facet joint of lumbar spine; Spondylosis without myelopathy or radiculopathy, cervicothoracic region; Bipolar affective disorder, current episode manic ([place]); History of allergy to radiographic contrast media; Lumbar spondylosis; and Cervicogenic headache on their problem list. Her primarily concern today is the No chief complaint on file.  Pain Assessment: Location:     Radiating:   Onset:   Duration:   Quality:   Severity:  /10 (subjective, self-reported pain score)  Note: Reported level is compatible with observation.                         When using our objective Pain Scale, levels between 6 and 10/10 are said to belong in an emergency room, as it progressively worsens from a 6/10, described as severely limiting, requiring emergency care not usually available at an outpatient pain management facility. At a 6/10 level, communication becomes difficult and requires great effort. Assistance to reach the emergency department may be required. Facial flushing and profuse sweating along with potentially dangerous increases in heart rate and blood pressure will be evident. Effect on  ADL:   Timing:   Modifying factors:   BP:    HR:    Jean Gomez comes in today for post-procedure evaluation.  Further details on both, my assessment(s), as  well as the proposed treatment plan, please see below.  Post-Procedure Assessment  11/15/2018 Procedure: Diagnostic bilateral lumbar facet block #3 under fluoroscopic guidance and IV sedation Pre-procedure pain score:  6/10 Post-procedure pain score: 0/10 (100% relief) Influential Factors: BMI:   Intra-procedural challenges: None observed.         Assessment challenges: None detected.              Reported side-effects: None.        Post-procedural adverse reactions or complications: None reported         Sedation: Sedation provided. When no sedatives are used, the analgesic levels obtained are directly associated to the effectiveness of the local anesthetics. However, when sedation is provided, the level of analgesia obtained during the initial 1 hour following the intervention, is believed to be the result of a combination of factors. These factors may include, but are not limited to: 1. The effectiveness of the local anesthetics used. 2. The effects of the analgesic(s) and/or anxiolytic(s) used. 3. The degree of discomfort experienced by the patient at the time of the procedure. 4. The patients ability and reliability in recalling and recording the events. 5. The presence and influence of possible secondary gains and/or psychosocial factors. Reported result: Relief experienced during the 1st hour after the procedure:   (Ultra-Short Term Relief)            Interpretative annotation: Clinically appropriate result. Analgesia during this period is likely to be Local Anesthetic and/or IV Sedative (Analgesic/Anxiolytic) related.          Effects of local anesthetic: The analgesic effects attained during this period are directly associated to the localized infiltration of local anesthetics and therefore cary significant diagnostic value  as to the etiological location, or anatomical origin, of the pain. Expected duration of relief is directly dependent on the pharmacodynamics of the local anesthetic used. Long-acting (4-6 hours) anesthetics used.  Reported result: Relief during the next 4 to 6 hour after the procedure:   (Short-Term Relief)            Interpretative annotation: Clinically appropriate result. Analgesia during this period is likely to be Local Anesthetic-related.          Long-term benefit: Defined as the period of time past the expected duration of local anesthetics (1 hour for short-acting and 4-6 hours for long-acting). With the possible exception of prolonged sympathetic blockade from the local anesthetics, benefits during this period are typically attributed to, or associated with, other factors such as analgesic sensory neuropraxia, antiinflammatory effects, or beneficial biochemical changes provided by agents other than the local anesthetics.  Reported result: Extended relief following procedure:   (Long-Term Relief)            Interpretative annotation: Clinically possible results. Good relief. No permanent benefit expected. Inflammation plays a part in the etiology to the pain.          Current benefits: Defined as reported results that persistent at this point in time.   Analgesia: *** %            Function: Somewhat improved ROM: Somewhat improved Interpretative annotation: Recurrence of symptoms. No permanent benefit expected. Effective diagnostic intervention.          Interpretation: Results would suggest a successful diagnostic intervention.                  Plan:  Please see "Plan of Care" for details.  Laboratory Chemistry  Inflammation Markers (CRP: Acute Phase) (ESR: Chronic Phase) Lab Results  Component Value Date   CRP <0.8 02/07/2018   ESRSEDRATE 53 (H) 02/07/2018                         Rheumatology Markers No results found for: RF, ANA, LABURIC, URICUR, LYMEIGGIGMAB,  LYMEABIGMQN, HLAB27                      Renal Markers Lab Results  Component Value Date   BUN 10 04/18/2018   CREATININE 0.60 04/18/2018   GFRAA >60 04/18/2018   GFRNONAA >60 04/18/2018                             Hepatic Markers Lab Results  Component Value Date   AST 31 04/17/2018   ALT 22 04/17/2018   ALBUMIN 4.9 04/17/2018                        Neuropathy Markers Lab Results  Component Value Date   VITAMINB12 268 02/07/2018   HGBA1C 5.9 (H) 04/18/2018                        Hematology Parameters Lab Results  Component Value Date   PLT 423 04/17/2018   HGB 10.8 (L) 04/17/2018   HCT 33.1 (L) 04/17/2018                        CV Markers No results found for: BNP, CKTOTAL, CKMB, TROPONINI                       Note: Lab results reviewed.  Recent Imaging Results   Results for orders placed in visit on 10/29/18  DG C-Arm 1-60 Min-No Report   Narrative Fluoroscopy was utilized by the requesting physician.  No radiographic  interpretation.    Interpretation Report: Fluoroscopy was used during the procedure to assist with needle guidance. The images were interpreted intraoperatively by the requesting physician.  Meds   Current Outpatient Medications:  .  Albuterol Sulfate 108 (90 Base) MCG/ACT AEPB, Inhale 2 puffs into the lungs every 6 (six) hours as needed., Disp: , Rfl:  .  amLODipine (NORVASC) 10 MG tablet, Take 1 tablet (10 mg total) by mouth daily., Disp: 30 tablet, Rfl: 0 .  aspirin EC 81 MG tablet, Take 1 tablet (81 mg total) by mouth daily., Disp: 30 tablet, Rfl: 0 .  atorvastatin (LIPITOR) 10 MG tablet, Take 1 tablet (10 mg total) by mouth daily., Disp: 30 tablet, Rfl: 0 .  betamethasone, augmented, (DIPROLENE) 0.05 % lotion, Apply topically 2 (two) times daily., Disp: , Rfl:  .  clotrimazole-betamethasone (LOTRISONE) cream, Apply 1 application topically 2 (two) times daily., Disp: 30 g, Rfl: 0 .  diclofenac sodium (VOLTAREN) 1 % GEL, Apply 2 g  topically 4 (four) times daily., Disp: 3 Tube, Rfl: 1 .  escitalopram (LEXAPRO) 20 MG tablet, Take 1 tablet (20 mg total) by mouth daily., Disp: 30 tablet, Rfl: 0 .  hydrochlorothiazide (HYDRODIURIL) 25 MG tablet, Take 1 tablet (25 mg total) by mouth daily., Disp: 30 tablet, Rfl: 0 .  HYDROcodone-acetaminophen (NORCO) 10-325 MG tablet, Take 1 tablet by mouth every 8 (eight) hours as needed for severe pain., Disp: 90 tablet, Rfl: 0 .  HYDROcodone-acetaminophen (  NORCO) 10-325 MG tablet, Take 1 tablet by mouth every 8 (eight) hours as needed for severe pain., Disp: 90 tablet, Rfl: 0 .  HYDROcodone-acetaminophen (NORCO) 10-325 MG tablet, Take 1 tablet by mouth every 6 (six) hours as needed for severe pain., Disp: 120 tablet, Rfl: 0 .  ipratropium (ATROVENT) 0.02 % nebulizer solution, Take 0.5 mg by nebulization every 4 (four) hours as needed for wheezing or shortness of breath., Disp: , Rfl:  .  levETIRAcetam (KEPPRA) 750 MG tablet, Take 1 tablet (750 mg total) by mouth daily., Disp: 30 tablet, Rfl: 0 .  lidocaine (XYLOCAINE) 5 % ointment, Apply 1 application topically 4 (four) times daily as needed for moderate pain., Disp: 35.44 g, Rfl: 2 .  losartan (COZAAR) 25 MG tablet, Take 1 tablet (25 mg total) by mouth daily., Disp: 30 tablet, Rfl: 0 .  nitroGLYCERIN (NITROSTAT) 0.4 MG SL tablet, Place 0.4 mg under the tongue every 5 (five) minutes as needed for chest pain., Disp: , Rfl:  .  pantoprazole (PROTONIX) 40 MG tablet, Take 1 tablet (40 mg total) by mouth daily., Disp: 30 tablet, Rfl: 0 .  potassium chloride SA (K-DUR,KLOR-CON) 20 MEQ tablet, Take 1 tablet (20 mEq total) by mouth 2 (two) times daily., Disp: 30 tablet, Rfl: 0 .  sucralfate (CARAFATE) 1 g tablet, Take 1 tablet (1 g total) by mouth 4 (four) times daily., Disp: 120 tablet, Rfl: 0 .  tiZANidine (ZANAFLEX) 4 MG tablet, Take 1 tablet (4 mg total) by mouth 3 (three) times daily., Disp: 90 tablet, Rfl: 1 .  torsemide (DEMADEX) 5 MG tablet, ,  Disp: , Rfl: 11 .  venlafaxine (EFFEXOR) 75 MG tablet, TK 1 T PO BID, Disp: , Rfl: 3  ROS  Constitutional: Denies any fever or chills Gastrointestinal: No reported hemesis, hematochezia, vomiting, or acute GI distress Musculoskeletal: Denies any acute onset joint swelling, redness, loss of ROM, or weakness Neurological: No reported episodes of acute onset apraxia, aphasia, dysarthria, agnosia, amnesia, paralysis, loss of coordination, or loss of consciousness  Allergies  Jean Gomez is allergic to contrast media [iodinated diagnostic agents]; gabapentin; labetalol; sulfabenzamide; fentanyl; and penicillins.  [place]  Drug: Jean Gomez  reports that she does not use drugs. Alcohol:  reports that she drinks alcohol. Tobacco:  reports that she has never smoked. She has never used smokeless tobacco. Medical:  has a past medical history of Asthma, Cerebral aneurysm, Chest pain (11/01/2016), Chronic migraine, Chronic respiratory failure ([place]), COPD (chronic obstructive pulmonary disease) ([place]), Hypertension, Osteoarthritis, Sarcoid, and Seizures ([place]). Surgical: Jean Gomez  has a past surgical history that includes Cholecystectomy; Abdominal hysterectomy; and Spinal fusion. Family: family history is not on file.  Constitutional Exam  General appearance: Well nourished, well developed, and well hydrated. In no apparent acute distress There were no vitals filed for this visit. BMI Assessment: Estimated body mass index is 27.46 kg/m as calculated from the following:   Height as of 10/29/18: '5\' 3"'$  (1.6 m).   Weight as of 10/29/18: 155 lb (70.3 kg).  BMI interpretation table: BMI level Category Range association with higher incidence of chronic pain  <18 kg/m2 Underweight   18.5-24.9 kg/m2 Ideal body weight   25-29.9 kg/m2 Overweight Increased incidence by 20%  30-34.9 kg/m2 Obese (Class I) Increased incidence by 68%  35-39.9 kg/m2 Severe obesity (Class II) Increased incidence by 136%  >40 kg/m2  Extreme obesity (Class III) Increased incidence by 254%   Patient's current BMI Ideal Body weight  There is no height or  weight on file to calculate BMI. Patient weight not recorded   BMI Readings from Last 4 Encounters:  10/29/18 27.46 kg/m  10/21/18 27.81 kg/m  09/19/18 26.93 kg/m  09/12/18 28.15 kg/m   Wt Readings from Last 4 Encounters:  10/29/18 155 lb (70.3 kg)  10/21/18 157 lb (71.2 kg)  09/19/18 152 lb (68.9 kg)  09/12/18 164 lb (74.4 kg)  Psych/Mental status: Alert, oriented x 3 (person, place, & time)       Eyes: PERLA Respiratory: No evidence of acute respiratory distress  Cervical Spine Area Exam  Skin & Axial Inspection: No masses, redness, edema, swelling, or associated skin lesions Alignment: Symmetrical Functional ROM: Unrestricted ROM      Stability: No instability detected Muscle Tone/Strength: Functionally intact. No obvious neuro-muscular anomalies detected. Sensory (Neurological): Unimpaired Palpation: No palpable anomalies              Upper Extremity (UE) Exam    Side: Right upper extremity  Side: Left upper extremity  Skin & Extremity Inspection: Skin color, temperature, and hair growth are WNL. No peripheral edema or cyanosis. No masses, redness, swelling, asymmetry, or associated skin lesions. No contractures.  Skin & Extremity Inspection: Skin color, temperature, and hair growth are WNL. No peripheral edema or cyanosis. No masses, redness, swelling, asymmetry, or associated skin lesions. No contractures.  Functional ROM: Unrestricted ROM          Functional ROM: Unrestricted ROM          Muscle Tone/Strength: Functionally intact. No obvious neuro-muscular anomalies detected.  Muscle Tone/Strength: Functionally intact. No obvious neuro-muscular anomalies detected.  Sensory (Neurological): Unimpaired          Sensory (Neurological): Unimpaired          Palpation: No palpable anomalies              Palpation: No palpable anomalies               Provocative Test(s):  Phalen's test: deferred Tinel's test: deferred Apley's scratch test (touch opposite shoulder):  Action 1 (Across chest): deferred Action 2 (Overhead): deferred Action 3 (LB reach): deferred   Provocative Test(s):  Phalen's test: deferred Tinel's test: deferred Apley's scratch test (touch opposite shoulder):  Action 1 (Across chest): deferred Action 2 (Overhead): deferred Action 3 (LB reach): deferred    Thoracic Spine Area Exam  Skin & Axial Inspection: No masses, redness, or swelling Alignment: Symmetrical Functional ROM: Unrestricted ROM Stability: No instability detected Muscle Tone/Strength: Functionally intact. No obvious neuro-muscular anomalies detected. Sensory (Neurological): Unimpaired Muscle strength & Tone: No palpable anomalies  Lumbar Spine Area Exam  Skin & Axial Inspection: No masses, redness, or swelling Alignment: Symmetrical Functional ROM: Unrestricted ROM       Stability: No instability detected Muscle Tone/Strength: Functionally intact. No obvious neuro-muscular anomalies detected. Sensory (Neurological): Unimpaired Palpation: No palpable anomalies       Provocative Tests: Hyperextension/rotation test: deferred today       Lumbar quadrant test (Kemp's test): deferred today       Lateral bending test: deferred today       Patrick's Maneuver: deferred today                   FABER test: deferred today                   S-I anterior distraction/compression test: deferred today         S-I lateral compression test: deferred today  S-I Thigh-thrust test: deferred today         S-I Gaenslen's test: deferred today          Gait & Posture Assessment  Ambulation: Unassisted Gait: Relatively normal for age and body habitus Posture: WNL   Lower Extremity Exam    Side: Right lower extremity  Side: Left lower extremity  Stability: No instability observed          Stability: No instability observed          Skin & Extremity  Inspection: Skin color, temperature, and hair growth are WNL. No peripheral edema or cyanosis. No masses, redness, swelling, asymmetry, or associated skin lesions. No contractures.  Skin & Extremity Inspection: Skin color, temperature, and hair growth are WNL. No peripheral edema or cyanosis. No masses, redness, swelling, asymmetry, or associated skin lesions. No contractures.  Functional ROM: Unrestricted ROM                  Functional ROM: Unrestricted ROM                  Muscle Tone/Strength: Functionally intact. No obvious neuro-muscular anomalies detected.  Muscle Tone/Strength: Functionally intact. No obvious neuro-muscular anomalies detected.  Sensory (Neurological): Unimpaired        Sensory (Neurological): Unimpaired        DTR: Patellar: deferred today Achilles: deferred today Plantar: deferred today  DTR: Patellar: deferred today Achilles: deferred today Plantar: deferred today  Palpation: No palpable anomalies  Palpation: No palpable anomalies   Assessment  Primary Diagnosis & Pertinent Problem List: The primary encounter diagnosis was Occipital neuralgia (Primary Area of Pain) (Bilateral) (R>L). Diagnoses of Chronic low back pain (Secondary Area of Pain) (Bilateral) (R>L) w/ sciatica (Right), Chronic hip pain (Tertiary Area of Pain) (Bilateral) (R>L), Chronic knee pain (Fourth Area of Pain) (Bilateral) (R>L), Chronic lower extremity pain (Tertiary Area of Pain) (Bilateral) (R>L), and Chronic shoulder pain (Fifth Area of Pain) (Bilateral) (R>L) were also pertinent to this visit.  Status Diagnosis  Controlled Controlled Controlled 1. Occipital neuralgia (Primary Area of Pain) (Bilateral) (R>L)   2. 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 Chronic hip pain (Tertiary Area of Pain) (Bilateral) (R>L)   4. Chronic knee pain (Fourth Area of Pain) (Bilateral) (R>L)   5. Chronic lower extremity pain (Tertiary Area of Pain) (Bilateral)  (R>L)   6. Chronic shoulder pain (Fifth Area of Pain) (Bilateral) (R>L)     Problems updated and reviewed during this visit: No problems updated. Plan of Care  Pharmacotherapy (Medications Ordered): No orders of the defined types were placed in this encounter.  Medications administered today: Jean Gomez had no medications administered during this visit.  Procedure Orders    No procedure(s) ordered today   Lab Orders  No laboratory test(s) ordered today   Imaging Orders  No imaging studies ordered today   Referral Orders  No referral(s) requested today   Interventional management options: Planned, scheduled, and/or pending:   ***   Considering:   Diagnostic bilateral greater occipital nerve block #2 Possible bilateral occipital nerve RFA Possible bilateral occipital nerve peripheral nerves stimulator trial Diagnostic bilateral cervical facet block Possible bilateral cervical facet RFA Diagnostic right-sided cervical epidural steroid injection Diagnostic bilateral intra-articular shoulder joint injection Diagnostic bilateral suprascapular nerve block Possible bilateral suprascapular nerve RFA Diagnostic bilateral lumbar facet block Possible bilateral lumbar facet RFA Diagnostic right-sided lumbar epidural steroid injection Diagnostic bilateral transforaminal epidural steroid injection  Diagnostic bilateral intra-articular hip joint injection Diagnostic bilateral femoral nerve + obturator nerve block Possible bilateral femoral nerve + obturator nerve RFA Diagnostic bilateral intra-articular knee joint injection with local anesthetic and steroid Possible series of 5 bilateral intra-articular Hyalgan knee injections(series #2) (series #1 completed on 07/04/2018) Diagnostic bilateral genicular nerve block Possible bilateral genicular nerve RFA   Palliative PRN treatment(s):   None at this time   Provider-requested follow-up: No follow-ups on  file.  Future Appointments  Date Time Provider [place]  11/18/2018 11:15 AM Milinda Pointer, MD [facility] None  12/23/2018  9:30 AM Vevelyn Francois, NP [facility] None   Primary Care Physician: Jodi Marble, MD Location: [facility] Out[facility] Management Facility Note by: Gaspar Cola, MD Date: 11/18/2018; Time: 7:46 AM

## 2018-11-18 ENCOUNTER — Ambulatory Visit: Payer: Self-pay | Admitting: Pain Medicine

## 2018-11-27 ENCOUNTER — Other Ambulatory Visit: Payer: Self-pay | Admitting: Nurse Practitioner

## 2018-11-27 DIAGNOSIS — M5441 Lumbago with sciatica, right side: Principal | ICD-10-CM

## 2018-11-27 DIAGNOSIS — G8929 Other chronic pain: Secondary | ICD-10-CM

## 2018-11-27 DIAGNOSIS — M7918 Myalgia, other site: Secondary | ICD-10-CM

## 2018-12-17 ENCOUNTER — Other Ambulatory Visit: Payer: Self-pay | Admitting: Nurse Practitioner

## 2018-12-18 ENCOUNTER — Emergency Department: Payer: Medicare Other

## 2018-12-18 ENCOUNTER — Other Ambulatory Visit: Payer: Self-pay

## 2018-12-18 ENCOUNTER — Inpatient Hospital Stay
Admission: EM | Admit: 2018-12-18 | Discharge: 2018-12-20 | DRG: 190 | Disposition: A | Discharge status: Home / Self Care | Payer: Medicare Other | Attending: Specialist | Admitting: Specialist

## 2018-12-18 DIAGNOSIS — Z90710 Acquired absence of both cervix and uterus: Secondary | ICD-10-CM | POA: Diagnosis not present

## 2018-12-18 DIAGNOSIS — Z7982 Long term (current) use of aspirin: Secondary | ICD-10-CM | POA: Diagnosis not present

## 2018-12-18 DIAGNOSIS — M7918 Myalgia, other site: Secondary | ICD-10-CM | POA: Diagnosis not present

## 2018-12-18 DIAGNOSIS — Z79899 Other long term (current) drug therapy: Secondary | ICD-10-CM

## 2018-12-18 DIAGNOSIS — M47813 Spondylosis without myelopathy or radiculopathy, cervicothoracic region: Secondary | ICD-10-CM | POA: Diagnosis not present

## 2018-12-18 DIAGNOSIS — J9621 Acute and chronic respiratory failure with hypoxia: Secondary | ICD-10-CM | POA: Diagnosis present

## 2018-12-18 DIAGNOSIS — M1990 Unspecified osteoarthritis, unspecified site: Secondary | ICD-10-CM | POA: Diagnosis present

## 2018-12-18 DIAGNOSIS — Z981 Arthrodesis status: Secondary | ICD-10-CM

## 2018-12-18 DIAGNOSIS — M533 Sacrococcygeal disorders, not elsewhere classified: Secondary | ICD-10-CM | POA: Diagnosis present

## 2018-12-18 DIAGNOSIS — M47816 Spondylosis without myelopathy or radiculopathy, lumbar region: Secondary | ICD-10-CM | POA: Diagnosis not present

## 2018-12-18 DIAGNOSIS — Z79891 Long term (current) use of opiate analgesic: Secondary | ICD-10-CM

## 2018-12-18 DIAGNOSIS — D869 Sarcoidosis, unspecified: Secondary | ICD-10-CM | POA: Diagnosis present

## 2018-12-18 DIAGNOSIS — R569 Unspecified convulsions: Secondary | ICD-10-CM | POA: Diagnosis present

## 2018-12-18 DIAGNOSIS — F329 Major depressive disorder, single episode, unspecified: Secondary | ICD-10-CM | POA: Diagnosis present

## 2018-12-18 DIAGNOSIS — E876 Hypokalemia: Secondary | ICD-10-CM | POA: Diagnosis present

## 2018-12-18 DIAGNOSIS — J189 Pneumonia, unspecified organism: Secondary | ICD-10-CM | POA: Diagnosis present

## 2018-12-18 DIAGNOSIS — Z885 Allergy status to narcotic agent status: Secondary | ICD-10-CM

## 2018-12-18 DIAGNOSIS — Z888 Allergy status to other drugs, medicaments and biological substances status: Secondary | ICD-10-CM | POA: Diagnosis not present

## 2018-12-18 DIAGNOSIS — E785 Hyperlipidemia, unspecified: Secondary | ICD-10-CM | POA: Diagnosis present

## 2018-12-18 DIAGNOSIS — Z880 Allergy status to penicillin: Secondary | ICD-10-CM | POA: Diagnosis not present

## 2018-12-18 DIAGNOSIS — J440 Chronic obstructive pulmonary disease with acute lower respiratory infection: Secondary | ICD-10-CM | POA: Diagnosis present

## 2018-12-18 DIAGNOSIS — M5481 Occipital neuralgia: Secondary | ICD-10-CM | POA: Diagnosis present

## 2018-12-18 DIAGNOSIS — G43709 Chronic migraine without aura, not intractable, without status migrainosus: Secondary | ICD-10-CM | POA: Diagnosis present

## 2018-12-18 DIAGNOSIS — G894 Chronic pain syndrome: Secondary | ICD-10-CM | POA: Diagnosis present

## 2018-12-18 DIAGNOSIS — K219 Gastro-esophageal reflux disease without esophagitis: Secondary | ICD-10-CM | POA: Diagnosis present

## 2018-12-18 DIAGNOSIS — Z9981 Dependence on supplemental oxygen: Secondary | ICD-10-CM

## 2018-12-18 DIAGNOSIS — Z882 Allergy status to sulfonamides status: Secondary | ICD-10-CM

## 2018-12-18 DIAGNOSIS — R0602 Shortness of breath: Secondary | ICD-10-CM

## 2018-12-18 DIAGNOSIS — I10 Essential (primary) hypertension: Secondary | ICD-10-CM | POA: Diagnosis present

## 2018-12-18 DIAGNOSIS — Z91041 Radiographic dye allergy status: Secondary | ICD-10-CM

## 2018-12-18 DIAGNOSIS — R0902 Hypoxemia: Secondary | ICD-10-CM

## 2018-12-18 LAB — RESPIRATORY PANEL BY PCR
ADENOVIRUS-RVPPCR: NOT DETECTED
Bordetella pertussis: NOT DETECTED
CORONAVIRUS NL63-RVPPCR: NOT DETECTED
Chlamydophila pneumoniae: NOT DETECTED
Coronavirus 229E: NOT DETECTED
Coronavirus HKU1: NOT DETECTED
Coronavirus OC43: NOT DETECTED
Influenza A: NOT DETECTED
Influenza B: NOT DETECTED
Metapneumovirus: NOT DETECTED
Mycoplasma pneumoniae: NOT DETECTED
PARAINFLUENZA VIRUS 4-RVPPCR: NOT DETECTED
Parainfluenza Virus 1: NOT DETECTED
Parainfluenza Virus 2: NOT DETECTED
Parainfluenza Virus 3: NOT DETECTED
Respiratory Syncytial Virus: NOT DETECTED
Rhinovirus / Enterovirus: NOT DETECTED

## 2018-12-18 LAB — BASIC METABOLIC PANEL
Anion gap: 16 — ABNORMAL HIGH (ref 5–15)
BUN: 7 mg/dL — AB (ref 8–23)
CO2: 28 mmol/L (ref 22–32)
Calcium: 9.2 mg/dL (ref 8.9–10.3)
Chloride: 90 mmol/L — ABNORMAL LOW (ref 98–111)
Creatinine, Ser: 0.61 mg/dL (ref 0.44–1.00)
GFR calc Af Amer: >60 mL/min (ref >60)
GFR calc non Af Amer: >60 mL/min (ref >60)
Glucose, Bld: 179 mg/dL — ABNORMAL HIGH (ref 70–99)
Potassium: 2.4 mmol/L — CL (ref 3.5–5.1)
Sodium: 134 mmol/L — ABNORMAL LOW (ref 135–145)

## 2018-12-18 LAB — BLOOD GAS, VENOUS
Acid-Base Excess: 10.9 mmol/L — ABNORMAL HIGH (ref 0.0–2.0)
Bicarbonate: 35.8 mmol/L — ABNORMAL HIGH (ref 20.0–28.0)
Delivery systems: POSITIVE
FIO2: 0.35
O2 Saturation: 91.1 %
Patient temperature: 37
pCO2, Ven: 47 mmHg (ref 44.0–60.0)
pH, Ven: 7.49 — ABNORMAL HIGH (ref 7.250–7.430)
pO2, Ven: 56 mmHg — ABNORMAL HIGH (ref 32.0–45.0)

## 2018-12-18 LAB — LACTIC ACID, PLASMA
Lactic Acid, Venous: 3.2 mmol/L (ref 0.5–1.9)
Lactic Acid, Venous: 1.6 mmol/L (ref 0.5–1.9)

## 2018-12-18 LAB — CBC
HCT: 37.8 % (ref 36.0–46.0)
Hemoglobin: 12.2 g/dL (ref 12.0–15.0)
MCH: 27.3 pg (ref 26.0–34.0)
MCHC: 32.3 g/dL (ref 30.0–36.0)
MCV: 84.6 fL (ref 80.0–100.0)
Platelets: 449 10*3/uL — ABNORMAL HIGH (ref 150–400)
RBC: 4.47 MIL/uL (ref 3.87–5.11)
RDW: 13.4 % (ref 11.5–15.5)
WBC: 13.4 10*3/uL — AB (ref 4.0–10.5)
nRBC: 0 % (ref 0.0–0.2)

## 2018-12-18 LAB — MAGNESIUM: Magnesium: 1.7 mg/dL (ref 1.7–2.4)

## 2018-12-18 LAB — APTT: aPTT: 38 seconds — ABNORMAL HIGH (ref 24–36)

## 2018-12-18 LAB — TROPONIN I: Troponin I: <0.03 ng/mL (ref <0.03)

## 2018-12-18 LAB — INFLUENZA PANEL BY PCR (TYPE A & B)
Influenza A By PCR: NEGATIVE
Influenza B By PCR: NEGATIVE

## 2018-12-18 LAB — PROTIME-INR
INR: 0.85
Prothrombin Time: 11.5 seconds (ref 11.4–15.2)

## 2018-12-18 MED ORDER — IPRATROPIUM-ALBUTEROL 0.5-2.5 (3) MG/3ML IN SOLN
9.0000 mL | Freq: Once | RESPIRATORY_TRACT | Status: AC
  Administered 2018-12-18: 9 mL via RESPIRATORY_TRACT
  Filled 2018-12-18: qty 9

## 2018-12-18 MED ORDER — ASPIRIN EC 81 MG PO TBEC
81.0000 mg | DELAYED_RELEASE_TABLET | Freq: Every day | ORAL | Status: DC
  Administered 2018-12-19 – 2018-12-20 (×2): 81 mg via ORAL
  Filled 2018-12-18 (×2): qty 1

## 2018-12-18 MED ORDER — SODIUM CHLORIDE 0.9 % IV SOLN
1.0000 g | Freq: Once | INTRAVENOUS | Status: AC
  Administered 2018-12-18: 1 g via INTRAVENOUS
  Filled 2018-12-18: qty 10

## 2018-12-18 MED ORDER — ATORVASTATIN CALCIUM 20 MG PO TABS
20.0000 mg | ORAL_TABLET | Freq: Every day | ORAL | Status: DC
  Administered 2018-12-18 – 2018-12-19 (×2): 20 mg via ORAL
  Filled 2018-12-18 (×2): qty 1

## 2018-12-18 MED ORDER — VENLAFAXINE HCL 37.5 MG PO TABS
75.0000 mg | ORAL_TABLET | Freq: Two times a day (BID) | ORAL | Status: DC
  Administered 2018-12-18 – 2018-12-20 (×4): 75 mg via ORAL
  Filled 2018-12-18 (×5): qty 2

## 2018-12-18 MED ORDER — LOSARTAN POTASSIUM 25 MG PO TABS
25.0000 mg | ORAL_TABLET | Freq: Every day | ORAL | Status: DC
  Administered 2018-12-19 – 2018-12-20 (×2): 25 mg via ORAL
  Filled 2018-12-18 (×3): qty 1

## 2018-12-18 MED ORDER — QUETIAPINE FUMARATE 25 MG PO TABS
100.0000 mg | ORAL_TABLET | Freq: Once | ORAL | Status: AC
  Administered 2018-12-18: 100 mg via ORAL
  Filled 2018-12-18: qty 4

## 2018-12-18 MED ORDER — POTASSIUM CHLORIDE 10 MEQ/100ML IV SOLN
10.0000 meq | Freq: Once | INTRAVENOUS | Status: AC
  Administered 2018-12-18: 10 meq via INTRAVENOUS
  Filled 2018-12-18: qty 100

## 2018-12-18 MED ORDER — ONDANSETRON HCL 4 MG PO TABS: 4.0000 mg | ORAL_TABLET | Freq: Four times a day (QID) | ORAL | Status: DC | PRN

## 2018-12-18 MED ORDER — SODIUM CHLORIDE 0.9 % IV BOLUS
600.0000 mL | Freq: Once | INTRAVENOUS | Status: AC
  Administered 2018-12-18: 600 mL via INTRAVENOUS

## 2018-12-18 MED ORDER — AMLODIPINE BESYLATE 10 MG PO TABS
10.0000 mg | ORAL_TABLET | Freq: Every day | ORAL | Status: DC
  Administered 2018-12-19 – 2018-12-20 (×2): 10 mg via ORAL
  Filled 2018-12-18 (×3): qty 1

## 2018-12-18 MED ORDER — SODIUM CHLORIDE 0.9 % IV SOLN
INTRAVENOUS | Status: DC
  Administered 2018-12-18 – 2018-12-19 (×3): via INTRAVENOUS

## 2018-12-18 MED ORDER — DICLOFENAC SODIUM 1 % TD GEL
4.0000 g | Freq: Four times a day (QID) | TRANSDERMAL | Status: DC
  Administered 2018-12-18 – 2018-12-20 (×7): 4 g via TOPICAL
  Filled 2018-12-18: qty 100

## 2018-12-18 MED ORDER — IPRATROPIUM-ALBUTEROL 0.5-2.5 (3) MG/3ML IN SOLN
3.0000 mL | Freq: Four times a day (QID) | RESPIRATORY_TRACT | Status: DC
  Administered 2018-12-18 – 2018-12-20 (×6): 3 mL via RESPIRATORY_TRACT
  Filled 2018-12-18 (×6): qty 3

## 2018-12-18 MED ORDER — ONDANSETRON HCL 4 MG/2ML IJ SOLN: 4.0000 mg | Freq: Four times a day (QID) | INTRAMUSCULAR | Status: DC | PRN

## 2018-12-18 MED ORDER — POTASSIUM CHLORIDE CRYS ER 20 MEQ PO TBCR
40.0000 meq | EXTENDED_RELEASE_TABLET | Freq: Once | ORAL | Status: AC
  Administered 2018-12-18: 40 meq via ORAL
  Filled 2018-12-18: qty 2

## 2018-12-18 MED ORDER — ACETAMINOPHEN 650 MG RE SUPP: 650.0000 mg | Freq: Four times a day (QID) | RECTAL | Status: DC | PRN

## 2018-12-18 MED ORDER — ESCITALOPRAM OXALATE 10 MG PO TABS: 20.0000 mg | ORAL_TABLET | Freq: Every day | ORAL | Status: DC

## 2018-12-18 MED ORDER — SUCRALFATE 1 G PO TABS
1.0000 g | ORAL_TABLET | Freq: Three times a day (TID) | ORAL | Status: DC
  Administered 2018-12-18 – 2018-12-19 (×7): 1 g via ORAL
  Filled 2018-12-18 (×7): qty 1

## 2018-12-18 MED ORDER — TORSEMIDE 10 MG PO TABS
5.0000 mg | ORAL_TABLET | Freq: Every day | ORAL | Status: DC
  Administered 2018-12-19: 5 mg via ORAL
  Filled 2018-12-18 (×2): qty 0.5

## 2018-12-18 MED ORDER — LIDOCAINE 5 % EX OINT
1.0000 "application " | TOPICAL_OINTMENT | Freq: Four times a day (QID) | CUTANEOUS | Status: DC | PRN
  Filled 2018-12-18: qty 35.44

## 2018-12-18 MED ORDER — METHYLPREDNISOLONE SODIUM SUCC 125 MG IJ SOLR
125.0000 mg | Freq: Once | INTRAMUSCULAR | Status: AC
  Administered 2018-12-18: 125 mg via INTRAVENOUS
  Filled 2018-12-18: qty 2

## 2018-12-18 MED ORDER — SODIUM CHLORIDE 0.9 % IV BOLUS
1000.0000 mL | Freq: Once | INTRAVENOUS | Status: AC
  Administered 2018-12-18: 1000 mL via INTRAVENOUS

## 2018-12-18 MED ORDER — LEVETIRACETAM 500 MG PO TABS
500.0000 mg | ORAL_TABLET | Freq: Two times a day (BID) | ORAL | Status: DC
  Administered 2018-12-18 – 2018-12-20 (×4): 500 mg via ORAL
  Filled 2018-12-18 (×6): qty 1

## 2018-12-18 MED ORDER — ENOXAPARIN SODIUM 40 MG/0.4ML ~~LOC~~ SOLN
40.0000 mg | SUBCUTANEOUS | Status: DC
  Administered 2018-12-18 – 2018-12-19 (×2): 40 mg via SUBCUTANEOUS
  Filled 2018-12-18 (×2): qty 0.4

## 2018-12-18 MED ORDER — NITROGLYCERIN 0.4 MG SL SUBL: 0.4000 mg | SUBLINGUAL_TABLET | SUBLINGUAL | Status: DC | PRN

## 2018-12-18 MED ORDER — TIZANIDINE HCL 4 MG PO TABS
4.0000 mg | ORAL_TABLET | Freq: Three times a day (TID) | ORAL | Status: DC
  Administered 2018-12-18 – 2018-12-20 (×6): 4 mg via ORAL
  Filled 2018-12-18 (×8): qty 1

## 2018-12-18 MED ORDER — METHYLPREDNISOLONE SODIUM SUCC 40 MG IJ SOLR
40.0000 mg | Freq: Two times a day (BID) | INTRAMUSCULAR | Status: DC
  Administered 2018-12-18 – 2018-12-19 (×3): 40 mg via INTRAVENOUS
  Filled 2018-12-18 (×3): qty 1

## 2018-12-18 MED ORDER — CITALOPRAM HYDROBROMIDE 20 MG PO TABS
20.0000 mg | ORAL_TABLET | Freq: Every day | ORAL | Status: DC
  Administered 2018-12-18 – 2018-12-20 (×3): 20 mg via ORAL
  Filled 2018-12-18 (×3): qty 1

## 2018-12-18 MED ORDER — SODIUM CHLORIDE 0.9 % IV SOLN
500.0000 mg | Freq: Once | INTRAVENOUS | Status: AC
  Administered 2018-12-18: 500 mg via INTRAVENOUS
  Filled 2018-12-18: qty 500

## 2018-12-18 MED ORDER — BUDESONIDE 0.5 MG/2ML IN SUSP
0.5000 mg | Freq: Two times a day (BID) | RESPIRATORY_TRACT | Status: DC
  Administered 2018-12-18 – 2018-12-20 (×4): 0.5 mg via RESPIRATORY_TRACT
  Filled 2018-12-18 (×5): qty 2

## 2018-12-18 MED ORDER — SODIUM CHLORIDE 0.9 % IV BOLUS
500.0000 mL | Freq: Once | INTRAVENOUS | Status: AC
  Administered 2018-12-18: 500 mL via INTRAVENOUS

## 2018-12-18 MED ORDER — LINACLOTIDE 145 MCG PO CAPS
145.0000 ug | ORAL_CAPSULE | Freq: Every day | ORAL | Status: DC
  Administered 2018-12-19: 145 ug via ORAL
  Filled 2018-12-18 (×2): qty 1

## 2018-12-18 MED ORDER — PANTOPRAZOLE SODIUM 40 MG PO TBEC
40.0000 mg | DELAYED_RELEASE_TABLET | Freq: Every day | ORAL | Status: DC
  Administered 2018-12-18 – 2018-12-19 (×2): 40 mg via ORAL
  Filled 2018-12-18 (×2): qty 1

## 2018-12-18 MED ORDER — POTASSIUM CHLORIDE CRYS ER 20 MEQ PO TBCR
20.0000 meq | EXTENDED_RELEASE_TABLET | Freq: Three times a day (TID) | ORAL | Status: AC
  Administered 2018-12-18 – 2018-12-20 (×6): 20 meq via ORAL
  Filled 2018-12-18 (×6): qty 1

## 2018-12-18 MED ORDER — HYDROCODONE-ACETAMINOPHEN 10-325 MG PO TABS
1.0000 | ORAL_TABLET | Freq: Three times a day (TID) | ORAL | Status: DC | PRN
  Administered 2018-12-18 (×2): 1 via ORAL
  Filled 2018-12-18 (×2): qty 1

## 2018-12-18 MED ORDER — LEVOFLOXACIN IN D5W 750 MG/150ML IV SOLN
750.0000 mg | INTRAVENOUS | Status: DC
  Administered 2018-12-19 – 2018-12-20 (×2): 750 mg via INTRAVENOUS
  Filled 2018-12-18 (×2): qty 150

## 2018-12-18 MED ORDER — ACETAMINOPHEN 325 MG PO TABS: 650.0000 mg | ORAL_TABLET | Freq: Four times a day (QID) | ORAL | Status: DC | PRN

## 2018-12-18 NOTE — H&P (Signed)
[employer] -  at [facility]    PATIENT NAME: Jean Gomez    MR#:  [id number]  DATE OF BIRTH:  06/23/1956  DATE OF ADMISSION:  12/18/2018  PRIMARY CARE PHYSICIAN: Sherron Monday, MD   REQUESTING/REFERRING PHYSICIAN: Dr. Ileana Roup  CHIEF COMPLAINT:   Chief Complaint  Patient presents with  . Shortness of Breath    HISTORY OF PRESENT ILLNESS:  Jean Gomez  is a 63 y.o. 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  Patient says she has not been feeling well now for the past 4 to 5 days with progressive exertional dyspnea which is now worse at rest 2.  She admits to a cough which is productive and now has some blood-tinged sputum.  She was not improving and therefore came to the ER for further evaluation.  In the emergency room patient was noted to have a low-grade fever, mild leukocytosis and chest x-ray findings suggestive of multifocal pneumonia.  Hospitalist services were contacted for admission.  Patient admits to some pleuritic chest pain, and some intermittent nausea but no vomiting.  She denies any other associated symptoms presently.  PAST MEDICAL HISTORY:   Past Medical History:  Diagnosis Date  . Asthma   . Cerebral aneurysm   . Chest pain 11/01/2016  . Chronic migraine   . Chronic respiratory failure (HCC)   . COPD (chronic obstructive pulmonary disease) (HCC)   . Hypertension   . Osteoarthritis   . Sarcoid   . Seizures (HCC)     PAST SURGICAL HISTORY:   Past Surgical History:  Procedure Laterality Date  . ABDOMINAL HYSTERECTOMY    . CHOLECYSTECTOMY    . SPINAL FUSION      SOCIAL HISTORY:   Social History   Tobacco Use  . Smoking status: Never Smoker  . Smokeless tobacco: Never Used  Substance Use Topics  . Alcohol  use: Yes    Comment: occasional    FAMILY HISTORY:   Family History  Problem Relation Age of Onset  . Dementia Mother   . Breast cancer Mother   . Throat cancer Father     DRUG ALLERGIES:   Allergies  Allergen Reactions  . Contrast Media [Iodinated Diagnostic Agents] Swelling  . Gabapentin   . Labetalol Other (See Comments)    Made hair fall out  . Sulfabenzamide Nausea Only  . Fentanyl Rash  . Penicillins Rash    REVIEW OF SYSTEMS:   Review of Systems  Constitutional: Negative for chills, fever and weight loss.  HENT: Negative for congestion, nosebleeds and tinnitus.   Eyes: Negative for blurred vision, double vision and redness.  Respiratory: Positive for cough, hemoptysis and shortness of breath. Negative for wheezing.   Cardiovascular: Negative for chest pain, orthopnea, leg swelling and PND.  Gastrointestinal: Negative for abdominal pain, diarrhea, melena, nausea and vomiting.  Genitourinary: Negative for dysuria, hematuria and urgency.  Musculoskeletal: Negative for falls and joint pain.  Neurological: Negative for dizziness, tingling, sensory change, focal weakness, seizures, weakness and headaches.  Endo/Heme/Allergies: Negative for polydipsia. Does not bruise/bleed easily.  Psychiatric/Behavioral: Negative for depression and memory loss. The patient is not nervous/anxious.   All other systems reviewed and are negative.   MEDICATIONS AT HOME:   Prior to Admission medications   Medication Sig Start Date End Date Taking? Authorizing Provider  Albuterol Sulfate 108 (90 Base) MCG/ACT AEPB Inhale 2 puffs into the lungs every 6 (six) hours as needed.   Yes [provider]  amLODipine (NORVASC) 10 MG tablet Take 1 tablet (10 mg total) by mouth daily. 04/19/18  Yes Clapacs, Jackquline Denmark, MD  aspirin EC 81 MG tablet Take 1 tablet (81 mg total) by mouth daily. 04/19/18  Yes Clapacs, Jackquline Denmark, MD  atorvastatin (LIPITOR) 20 MG tablet Take 20 mg by mouth daily.   Yes  [provider]  betamethasone, augmented, (DIPROLENE) 0.05 % lotion Apply topically 2 (two) times daily.   Yes [provider]  citalopram (CELEXA) 20 MG tablet Take 20 mg by mouth daily.   Yes [provider]  clotrimazole-betamethasone (LOTRISONE) cream Apply 1 application topically 2 (two) times daily. 04/19/18  Yes Clapacs, Jackquline Denmark, MD  diclofenac sodium (VOLTAREN) 1 % GEL Apply 4 g topically 4 (four) times daily.   Yes [provider]  doxycycline (PERIOSTAT) 20 MG tablet Take 20 mg by mouth 2 (two) times daily.   Yes [provider]  escitalopram (LEXAPRO) 20 MG tablet Take 1 tablet (20 mg total) by mouth daily. 04/19/18  Yes Clapacs, Jackquline Denmark, MD  hydrochlorothiazide (HYDRODIURIL) 25 MG tablet Take 1 tablet (25 mg total) by mouth daily. 04/19/18  Yes Clapacs, Jackquline Denmark, MD  HYDROcodone-acetaminophen (NORCO) 10-325 MG tablet Take 1 tablet by mouth every 8 (eight) hours as needed for severe pain. 11/18/18 12/18/18 Yes King, Shana Chute, NP  ipratropium (ATROVENT) 0.02 % nebulizer solution Take 0.5 mg by nebulization every 4 (four) hours as needed for wheezing or shortness of breath.   Yes [provider]  levETIRAcetam (KEPPRA) 500 MG tablet Take 500 mg by mouth 2 (two) times daily. 11/20/18  Yes [provider]  lidocaine (XYLOCAINE) 5 % ointment Apply 1 application topically 4 (four) times daily as needed for moderate pain. 09/19/18 12/18/18 Yes King, Shana Chute, NP  linaclotide (LINZESS) 145 MCG CAPS capsule Take 145 mcg by mouth daily before breakfast.   Yes [provider]  losartan (COZAAR) 25 MG tablet Take 1 tablet (25 mg total) by mouth daily. 04/19/18  Yes Clapacs, Jackquline Denmark, MD  ondansetron (ZOFRAN) 4 MG tablet Take 4 mg by mouth every 8 (eight) hours as needed for nausea or vomiting.   Yes [provider]  pantoprazole (PROTONIX) 40 MG tablet Take 1 tablet (40 mg total) by mouth daily. 04/19/18  Yes Clapacs, Jackquline Denmark, MD  potassium  chloride SA (K-DUR,KLOR-CON) 20 MEQ tablet Take 1 tablet (20 mEq total) by mouth 2 (two) times daily. 04/19/18  Yes Clapacs, Jackquline Denmark, MD  sucralfate (CARAFATE) 1 g tablet Take 1 tablet (1 g total) by mouth 4 (four) times daily. 04/19/18  Yes Clapacs, Jackquline Denmark, MD  tiZANidine (ZANAFLEX) 4 MG tablet Take 1 tablet (4 mg total) by mouth 3 (three) times daily. 09/19/18 12/18/18 Yes Barbette Merino, NP  torsemide (DEMADEX) 5 MG tablet  03/14/18  Yes [provider]  venlafaxine (EFFEXOR) 75 MG tablet TK 1 T PO BID 09/06/18  Yes [provider]  atorvastatin (LIPITOR) 10 MG tablet Take 1 tablet (10 mg total) by mouth daily. Patient not taking: Reported on 12/18/2018 04/19/18   Clapacs, Jackquline Denmark, MD  HYDROcodone-acetaminophen (NORCO) 10-325 MG tablet Take 1 tablet by mouth every 8 (eight) hours as needed for severe pain. 10/19/18 11/18/18  Barbette Merino, NP  HYDROcodone-acetaminophen (NORCO) 10-325 MG tablet Take 1 tablet by mouth  every 6 (six) hours as needed for severe pain. 09/19/18 10/19/18  Barbette MerinoKing, Crystal M, NP  nitroGLYCERIN (NITROSTAT) 0.4 MG SL tablet Place 0.4 mg under the tongue every 5 (five) minutes as needed for chest pain.    [provider]      VITAL SIGNS:  Blood pressure (!) 136/95, pulse (!) 114, temperature 99.7 F (37.6 C), temperature source Oral, resp. rate (!) 27, height 5\' 4"  (1.626 m), weight 72.1 kg, SpO2 98 %.  PHYSICAL EXAMINATION:  Physical Exam  GENERAL:  63 y.o.-year-old patient lying in the bed in mild Resp. Distress but talking in full sentences.   EYES: Pupils equal, round, reactive to light and accommodation. No scleral icterus. Extraocular muscles intact.  HEENT: Head atraumatic, normocephalic. Oropharynx and nasopharynx clear. No oropharyngeal erythema, moist oral mucosa  NECK:  Supple, no jugular venous distention. No thyroid enlargement, no tenderness.  LUNGS: Good air entry bilaterally, minimal rhonchi and minimal end expiratory wheezing bilaterally, no  rales.  Negative use of accessory muscles. CARDIOVASCULAR: S1, S2 RRR. No murmurs, rubs, gallops, clicks.  ABDOMEN: Soft, nontender, nondistended. Bowel sounds present. No organomegaly or mass.  EXTREMITIES: No pedal edema, cyanosis, or clubbing. + 2 pedal & radial pulses b/l.   NEUROLOGIC: Cranial nerves II through XII are intact. No focal Motor or sensory deficits appreciated b/l PSYCHIATRIC: The patient is alert and oriented x 3. Good affect.  SKIN: No obvious rash, lesion, or ulcer.   LABORATORY PANEL:   CBC Recent Labs  Lab 12/18/18 0743  WBC 13.4*  HGB 12.2  HCT 37.8  PLT 449*   ------------------------------------------------------------------------------------------------------------------  Chemistries  Recent Labs  Lab 12/18/18 0743  NA 134*  K 2.4*  CL 90*  CO2 28  GLUCOSE 179*  BUN 7*  CREATININE 0.61  CALCIUM 9.2  MG 1.7   ------------------------------------------------------------------------------------------------------------------  Cardiac Enzymes Recent Labs  Lab 12/18/18 0743  TROPONINI <0.03   ------------------------------------------------------------------------------------------------------------------  RADIOLOGY:  Dg Chest Port 1 View  Result Date: 12/18/2018 CLINICAL DATA:  Pt presents with SOB. Nonsmoker. Hx of HTN, COPD, chronic respiratory failure, and asthma. EXAM: PORTABLE CHEST - 1 VIEW COMPARISON:  CT 02/11/2018 FINDINGS: Airspace consolidation in the mid and lower right lung, new since previous. Linear scarring or atelectasis at the left lung base, and in the left suprahilar region as before. Heart size normal.  Right hilar fullness. No definite effusion.  No pneumothorax. Visualized bones unremarkable. IMPRESSION: 1. New right mid and lower lung airspace disease suggesting pneumonia, superimposed on chronic pulmonary parenchymal changes. Electronically Signed   By: Corlis Leak  Hassell M.D.   On: 12/18/2018 08:35     IMPRESSION AND PLAN:    63 year old female with past medical history of chronic respiratory failure, sarcoidosis, seizures, chronic migraines, chronic back pain, osteoarthritis who presented to the hospital due to shortness of breath and cough.  1.  Acute on chronic respiratory failure with hypoxia- secondary to pneumonia with underlying sarcoidosis. -Patient is currently on BiPAP and will attempt to wean her off the BiPAP. - We will treat patient's underlying pneumonia with IV Levaquin. - Given her underlying sarcoidosis/COPD will also place her on some IV steroids, scheduled duo nebs, Pulmicort nebs.  2.  Pneumonia-source of patient's worsening respiratory failure with hypoxia. - In the ER patient is received IV ceftriaxone, Zithromax.  I will place her on IV Levaquin.  3.  Sarcoidosis/COPD- mild acute flareup due to pneumonia. -We will treat with IV steroids, scheduled duo nebs, Pulmicort nebs. -Continue antibiotics as mentioned above.  4.  Hypokalemia-will replace potassium orally and intravenously.  Repeat level in the morning.  Mag level is normal.  5.  Essential hypertension-hemodynamically stable. -Continue Norvasc, losartan.  6.  History of seizures-no acute seizure activity.  Continue Keppra.    7.  Depression-continue Lexapro.  8.  Hyperlipidemia-continue atorvastatin.  9.  GERD-continue Protonix.  10.  Chronic back pain- continue Norco, Zanaflex.  All the records are reviewed and case discussed with ED provider. Management plans discussed with the patient, family and they are in agreement.  CODE STATUS: Full code  TOTAL TIME TAKING CARE OF THIS PATIENT: 45 minutes.    [place] SirenSAINANI,Zaliyah Meikle J M.D on 12/18/2018 at 9:40 AM  Between 7am to 6pm - Pager - [phone]  After 6pm go to [email or web address] - password [email or web address] [facility]  [facility]  Office  [phone]  CC: Primary care physician; Sherron Mondayejan-[name], S Ahmed, MD

## 2018-12-18 NOTE — Progress Notes (Signed)
Pt was taken off bipap and placed on a nasal cannula.

## 2018-12-18 NOTE — ED Notes (Signed)
Pt taken off of BiPAP and placed on 4L 02 via [place] per MD request.

## 2018-12-18 NOTE — ED Notes (Signed)
Per Laureen Ochs RN, wanting to speak to supervisor prior to accepting pt to floor. Will call back to attempt to give report.

## 2018-12-18 NOTE — Consult Note (Signed)
Pharmacy Antibiotic Note  Jean Gomez is a 63 y.o. female admitted on 12/18/2018 with pneumonia.  Pharmacy has been consulted for Levaquin dosing.  Plan: Start IV Levaquin 750mg  every 24 hours  Height: 5\' 4"  (162.6 cm) Weight: 159 lb (72.1 kg) IBW/kg (Calculated) : 54.7  Temp (24hrs), Avg:99.7 F (37.6 C), Min:99.7 F (37.6 C), Max:99.7 F (37.6 C)  Recent Labs  Lab 12/18/18 0743  WBC 13.4*  CREATININE 0.61  LATICACIDVEN 3.2*    Estimated Creatinine Clearance: 71 mL/min (by C-G formula based on SCr of 0.61 mg/dL).    Allergies  Allergen Reactions  . Contrast Media [Iodinated Diagnostic Agents] Swelling  . Gabapentin   . Labetalol Other (See Comments)    Made hair fall out  . Sulfabenzamide Nausea Only  . Fentanyl Rash  . Penicillins Rash    Antimicrobials this admission: Levaquin 1/1 >>  ceftriaxone 1/1 Azithromycin 1/1  Microbiology results: 1/1 BCx: pending Influenza A,B (-)  Thank you for allowing pharmacy to be a part of this patient's care.  Lowella Bandy, PharmD 12/18/2018 9:48 AM

## 2018-12-18 NOTE — ED Provider Notes (Addendum)
[facility] Emergency Department Provider Note  ____________________________________________   I have reviewed the triage vital signs and the nursing notes. Where available I have reviewed prior notes and, if possible and indicated, outside hospital notes.    HISTORY  Chief Complaint Shortness of Breath    HPI Jean Gomez is a 63 y.o. female  With a history of COPD and sarcoid on 2 L oxygen, she has been having a cough, traced with a little bit of bloody sputum the last few days.  Got acutely worse this morning.  Somewhat limited history given patient acuity.  Patient was placed immediately on BiPAP upon arrival.  Her oxygen saturations were 75% on home oxygen.  She is doing somewhat better on the BiPAP at this limit history.  Positive for subjective fever at home positive for productive cough.  Denies significant chest pain.  Denies leg swelling.  No personal or family history of PE or DVT she states   Past Medical History:  Diagnosis Date  . Asthma   . Cerebral aneurysm   . Chest pain 11/01/2016  . Chronic migraine   . Chronic respiratory failure (HCC)   . COPD (chronic obstructive pulmonary disease) (HCC)   . Hypertension   . Osteoarthritis   . Sarcoid   . Seizures [facility])     Patient Active Problem List   Diagnosis Date Noted  . Cervicogenic headache 10/21/2018  . Lumbar spondylosis 06/25/2018  . History of allergy to radiographic contrast media 05/30/2018  . Bipolar affective disorder, current episode manic (HCC) 04/17/2018  . Chronic hip pain [facility] Area of Pain) (Bilateral) (R>L) 02/26/2018  . DDD (degenerative disc disease), cervical 02/26/2018  . Cervical facet arthropathy (Bilateral) 02/26/2018  . Cervical facet syndrome (Bilateral) (R>L) 02/26/2018  . Cervical Grade 1 Anterolisthesis of C4 over C5 (Degenerative) 02/26/2018  . Cervical Grade 1 Retrolisthesis of C5 over C6 02/26/2018  . Cervical foraminal stenosis (C3-4,  C4-5, and C5-6) (Bilateral) 02/26/2018  . DDD (degenerative disc disease), lumbar 02/26/2018  . Osteoarthritis of lumbar spine 02/26/2018  . Osteoarthritis of facet joint of lumbar spine 02/26/2018  . Spondylosis without myelopathy or radiculopathy, cervicothoracic region 02/26/2018  . Occipital headache (Right) 02/25/2018  . Frontal headache (Left) 02/25/2018  . Chronic low back pain (Midline) (Secondary Area of Pain) 02/25/2018  . Chronic lumbar radiculopathy (Right) 02/25/2018  . Osteoarthritis involving multiple joints 02/25/2018  . Osteoarthritis of shoulder (Bilateral) 02/25/2018  . Chronic musculoskeletal pain 02/25/2018  . Lumbar facet joint syndrome (Bilateral) (R>L) 02/25/2018  . Vitamin D deficiency 02/14/2018  . Chronic pain syndrome 02/07/2018  . Pharmacologic therapy 02/07/2018  . Disorder of skeletal system 02/07/2018  . Problems influencing health status 02/07/2018  . Long term current use of opiate analgesic 02/07/2018  . Chronic low back pain (Secondary Area of Pain) (Bilateral) (R>L) w/ sciatica (Right) 02/07/2018  . Chronic lower extremity pain [facility] Area of Pain) (Bilateral) (R>L) 02/07/2018  . Chronic knee pain (Fourth Area of Pain) (Bilateral) (R>L) 02/07/2018  . Chronic shoulder pain (Fifth Area of Pain) (Bilateral) (R>L) 02/07/2018  . Occipital neuralgia (Primary Area of Pain) (Bilateral) (R>L) 02/07/2018  . Chronic neck pain (Bilateral) (R>L) 02/07/2018  . Chronic sacroiliac joint pain (Right) 02/07/2018  . H/O aneurysm 01/30/2018  . History of depression 01/30/2018  . History of seizure disorder 01/30/2018  . Chronic headache disorder (Primary Area of Pain) 01/30/2018  . Chronic migraine without aura 01/17/2018  . Chronic respiratory failure with hypoxia (HCC) 01/17/2018  .  Constipation 01/17/2018  . HTN, goal below 140/90 01/17/2018  . Hypercholesterolemia 01/17/2018  . Major depression in remission (HCC) 01/17/2018  . COPD (chronic obstructive  pulmonary disease) (HCC) 01/17/2018  . Sarcoidosis 01/17/2018  . Seizure disorder (HCC) 01/17/2018  . Nonruptured cerebral aneurysm 10/22/2017  . Obstructive sleep apnea on CPAP 09/11/2017  . Generalized anxiety disorder 01/26/2017  . Irritable bowel syndrome 01/26/2017  . Pulmonary emphysema (HCC) 01/26/2017  . GERD (gastroesophageal reflux disease) 08/02/2016  . Hiatal hernia 08/02/2016  . Essential tremor 07/19/2016  . Muscle spasm 10/05/2015  . Spondylosis without myelopathy or radiculopathy, lumbosacral region 10/05/2015  . Rotator cuff injury 09/17/2015  . Chronic shoulder pain (Right) 09/17/2015  . Epilepsy (HCC) 07/14/2015  . Insomnia 07/14/2015  . Osteoarthritis of knee (Bilateral) (R>L) 06/24/2015    Past Surgical History:  Procedure Laterality Date  . ABDOMINAL HYSTERECTOMY    . CHOLECYSTECTOMY    . SPINAL FUSION      Prior to Admission medications   Medication Sig Start Date End Date Taking? Authorizing Provider  Albuterol Sulfate 108 (90 Base) MCG/ACT AEPB Inhale 2 puffs into the lungs every 6 (six) hours as needed.    [provider]  amLODipine (NORVASC) 10 MG tablet Take 1 tablet (10 mg total) by mouth daily. 04/19/18   Clapacs, Jackquline DenmarkJohn T, MD  aspirin EC 81 MG tablet Take 1 tablet (81 mg total) by mouth daily. 04/19/18   Clapacs, Jackquline DenmarkJohn T, MD  atorvastatin (LIPITOR) 10 MG tablet Take 1 tablet (10 mg total) by mouth daily. 04/19/18   Clapacs, Jackquline DenmarkJohn T, MD  betamethasone, augmented, (DIPROLENE) 0.05 % lotion Apply topically 2 (two) times daily.    [provider]  clotrimazole-betamethasone (LOTRISONE) cream Apply 1 application topically 2 (two) times daily. 04/19/18   Clapacs, Jackquline DenmarkJohn T, MD  escitalopram (LEXAPRO) 20 MG tablet Take 1 tablet (20 mg total) by mouth daily. 04/19/18   Clapacs, Jackquline DenmarkJohn T, MD  hydrochlorothiazide (HYDRODIURIL) 25 MG tablet Take 1 tablet (25 mg total) by mouth daily. 04/19/18   Clapacs, Jackquline DenmarkJohn T, MD  HYDROcodone-acetaminophen (NORCO) 10-325 MG  tablet Take 1 tablet by mouth every 8 (eight) hours as needed for severe pain. 11/18/18 12/18/18  Barbette MerinoKing, Crystal M, NP  HYDROcodone-acetaminophen (NORCO) 10-325 MG tablet Take 1 tablet by mouth every 8 (eight) hours as needed for severe pain. 10/19/18 11/18/18  Barbette MerinoKing, Crystal M, NP  HYDROcodone-acetaminophen (NORCO) 10-325 MG tablet Take 1 tablet by mouth every 6 (six) hours as needed for severe pain. 09/19/18 10/19/18  Barbette MerinoKing, Crystal M, NP  ipratropium (ATROVENT) 0.02 % nebulizer solution Take 0.5 mg by nebulization every 4 (four) hours as needed for wheezing or shortness of breath.    [provider]  levETIRAcetam (KEPPRA) 750 MG tablet Take 1 tablet (750 mg total) by mouth daily. 04/19/18   Clapacs, Jackquline DenmarkJohn T, MD  lidocaine (XYLOCAINE) 5 % ointment Apply 1 application topically 4 (four) times daily as needed for moderate pain. 09/19/18 12/18/18  Barbette MerinoKing, Crystal M, NP  losartan (COZAAR) 25 MG tablet Take 1 tablet (25 mg total) by mouth daily. 04/19/18   Clapacs, Jackquline DenmarkJohn T, MD  nitroGLYCERIN (NITROSTAT) 0.4 MG SL tablet Place 0.4 mg under the tongue every 5 (five) minutes as needed for chest pain.    [provider]  pantoprazole (PROTONIX) 40 MG tablet Take 1 tablet (40 mg total) by mouth daily. 04/19/18   Clapacs, Jackquline DenmarkJohn T, MD  potassium chloride SA (K-DUR,KLOR-CON) 20 MEQ tablet Take 1 tablet (20 mEq total)  by mouth 2 (two) times daily. 04/19/18   Clapacs, Jackquline Denmark, MD  sucralfate (CARAFATE) 1 g tablet Take 1 tablet (1 g total) by mouth 4 (four) times daily. 04/19/18   Clapacs, Jackquline Denmark, MD  tiZANidine (ZANAFLEX) 4 MG tablet Take 1 tablet (4 mg total) by mouth 3 (three) times daily. 09/19/18 11/18/18  Barbette Merino, NP  torsemide [facility]) 5 MG tablet  03/14/18   [provider]  venlafaxine (EFFEXOR) 75 MG tablet TK 1 T PO BID 09/06/18   [provider]    Allergies Contrast media [iodinated diagnostic agents]; Gabapentin; Labetalol; Sulfabenzamide; Fentanyl; and Penicillins  History reviewed.  No pertinent family history.  Social History Social History   Tobacco Use  . Smoking status: Never Smoker  . Smokeless tobacco: Never Used  Substance Use Topics  . Alcohol use: Yes    Comment: occasional  . Drug use: No    Review of Systems Constitutional: + fever/chills Eyes: No visual changes. ENT: No sore throat. No stiff neck no neck pain Cardiovascular: Denies chest pain. Respiratory: Denies shortness of breath. Gastrointestinal:   no vomiting.  No diarrhea.  No constipation. Genitourinary: Negative for dysuria. Musculoskeletal: Negative lower extremity swelling Skin: Negative for rash. Neurological: Negative for severe headaches, focal weakness or numbness.   ____________________________________________   PHYSICAL EXAM:  VITAL SIGNS: ED Triage Vitals  Enc Vitals Group     BP 12/18/18 0736 (!) 154/100     Pulse Rate 12/18/18 0736 (!) 124     Resp 12/18/18 0736 (!) 32     Temp 12/18/18 0736 99.7 F (37.6 C)     Temp Source 12/18/18 0736 Oral     SpO2 12/18/18 0736 99 %     Weight 12/18/18 0736 150 lb (68 kg)     Height 12/18/18 0736 5\' 4"  (1.626 m)     Head Circumference --      Peak Flow --      Pain Score 12/18/18 0744 10     Pain Loc --      Pain Edu? --      Excl. in GC? --     Constitutional: Alert and oriented.  Patient initially quite anxious, more relaxed now that she is on BiPAP. Eyes: Conjunctivae are normal Head: Atraumatic HEENT: No congestion/rhinnorhea. Mucous membranes are moist.  Oropharynx non-erythematous Neck:   Nontender with no meningismus, no masses, no stridor Cardiovascular: Normal rate, regular rhythm. Grossly normal heart sounds.  Good peripheral circulation. Respiratory: Easy work of breathing, diffuse rhonchi especially in the right lung field Abdominal: Soft and nontender. No distention. No guarding no rebound Back:  There is no focal tenderness or step off.  there is no midline tenderness there are no lesions noted. there  is no CVA tenderness Musculoskeletal: No lower extremity tenderness, no upper extremity tenderness. No joint effusions, no DVT signs strong distal pulses no edema Neurologic:  Normal speech and language. No gross focal neurologic deficits are appreciated.  Skin:  Skin is warm, dry and intact. No rash noted. Psychiatric: Mood and affect are normal. Speech and behavior are normal.  ____________________________________________   LABS (all labs ordered are listed, but only abnormal results are displayed)  Labs Reviewed  BASIC METABOLIC PANEL - Abnormal; Notable for the following components:      Result Value   Sodium 134 (*)    Potassium 2.4 (*)    Chloride 90 (*)    Glucose, Bld 179 (*)    BUN 7 (*)  Anion gap 16 (*)    All other components within normal limits  CBC - Abnormal; Notable for the following components:   WBC 13.4 (*)    Platelets 449 (*)    All other components within normal limits  BLOOD GAS, VENOUS - Abnormal; Notable for the following components:   pH, Ven 7.49 (*)    pO2, Ven 56.0 (*)    Bicarbonate 35.8 (*)    Acid-Base Excess 10.9 (*)    All other components within normal limits  APTT - Abnormal; Notable for the following components:   aPTT 38 (*)    All other components within normal limits  CULTURE, BLOOD (ROUTINE X 2)  CULTURE, BLOOD (ROUTINE X 2)  TROPONIN I  PROTIME-INR  INFLUENZA PANEL BY PCR (TYPE A & B)    Pertinent labs  results that were available during my care of the patient were reviewed by me and considered in my medical decision making (see chart for details). ____________________________________________  EKG  I personally interpreted any EKGs ordered by me or triage Sinus tach rate 119, normal axis nonspecific ST changes. ____________________________________________  RADIOLOGY  Pertinent labs & imaging results that were available during my care of the patient were reviewed by me and considered in my medical decision making (see  chart for details). If possible, patient and/or family made aware of any abnormal findings.  No results found. ____________________________________________    PROCEDURES  Procedure(s) performed: None  Procedures  Critical Care performed: CRITICAL CARE Performed by: Jeanmarie Plant   Total critical care time: 45 minutes  Critical care time was exclusive of separately billable procedures and treating other patients.  Critical care was necessary to treat or prevent imminent or life-threatening deterioration.  Critical care was time spent personally by me on the following activities: development of treatment plan with patient and/or surrogate as well as nursing, discussions with consultants, evaluation of patient's response to treatment, examination of patient, obtaining history from patient or surrogate, ordering and performing treatments and interventions, ordering and review of laboratory studies, ordering and review of radiographic studies, pulse oximetry and re-evaluation of patient's condition.   ____________________________________________   INITIAL IMPRESSION / ASSESSMENT AND PLAN / ED COURSE  Pertinent labs & imaging results that were available during my care of the patient were reviewed by me and considered in my medical decision making (see chart for details).  Patient here with signs and symptoms of pneumonia, empirically we are starting antibiotics on her, broad-spectrum including atypical coverage, recent hospitalization to suggest that she has acquired pneumonia.  Patient is quite ill but doing much better on BiPAP heart rate is coming down, work of breathing is greatly reduced and she is much more comfortable she is awake and alert and mentating clearly.  We are giving her some fluid.  Pressures have been stable.   ____________________________________________   FINAL CLINICAL IMPRESSION(S) / ED DIAGNOSES  Final diagnoses:  SOB (shortness of breath)      This  chart was dictated using voice recognition software.  Despite best efforts to proofread,  errors can occur which can change meaning.      Jeanmarie Plant, MD 12/18/18 1117    Jeanmarie Plant, MD 12/18/18 [phone]

## 2018-12-18 NOTE — ED Notes (Signed)
First Nurse Note:  Pt to ed walking with cane, in extreme respiratory distress.  Pt unable to speak in full complete sentences. Pt tachypneic.  Pt on home o2 at 2 lpm, sats at triage 76%.  Pt taken straight to room 26.

## 2018-12-18 NOTE — ED Notes (Signed)
Patient transported to room 158.  

## 2018-12-18 NOTE — ED Triage Notes (Signed)
Pt presents via POV with SOB. Per Inetta Fermo RN, 02 saturation 77% in triage on home 02 at 2L.Marland Kitchen Pt report hemoptysis. Reports s/s began Tuesday with worsening.

## 2018-12-18 NOTE — ED Notes (Signed)
Unable to give report due to RN availability. Floor RN to call back to receive report.

## 2018-12-19 ENCOUNTER — Telehealth: Payer: Self-pay | Admitting: Nurse Practitioner

## 2018-12-19 LAB — CBC
HCT: 30.9 % — ABNORMAL LOW (ref 36.0–46.0)
Hemoglobin: 9.9 g/dL — ABNORMAL LOW (ref 12.0–15.0)
MCH: 27.4 pg (ref 26.0–34.0)
MCHC: 32 g/dL (ref 30.0–36.0)
MCV: 85.6 fL (ref 80.0–100.0)
PLATELETS: 328 10*3/uL (ref 150–400)
RBC: 3.61 MIL/uL — ABNORMAL LOW (ref 3.87–5.11)
RDW: 13.3 % (ref 11.5–15.5)
WBC: 7.4 10*3/uL (ref 4.0–10.5)
nRBC: 0 % (ref 0.0–0.2)

## 2018-12-19 LAB — BASIC METABOLIC PANEL
Anion gap: 8 (ref 5–15)
BUN: 8 mg/dL (ref 8–23)
CO2: 31 mmol/L (ref 22–32)
Calcium: 8.6 mg/dL — ABNORMAL LOW (ref 8.9–10.3)
Chloride: 98 mmol/L (ref 98–111)
Creatinine, Ser: 0.45 mg/dL (ref 0.44–1.00)
GFR calc Af Amer: >60 mL/min (ref >60)
GFR calc non Af Amer: >60 mL/min (ref >60)
Glucose, Bld: 162 mg/dL — ABNORMAL HIGH (ref 70–99)
Potassium: 3.2 mmol/L — ABNORMAL LOW (ref 3.5–5.1)
Sodium: 137 mmol/L (ref 135–145)

## 2018-12-19 MED ORDER — HYDROCODONE-ACETAMINOPHEN 10-325 MG PO TABS: 1.0000 | ORAL_TABLET | Freq: Three times a day (TID) | ORAL | Status: DC | PRN

## 2018-12-19 MED ORDER — QUETIAPINE FUMARATE 300 MG PO TABS
300.0000 mg | ORAL_TABLET | Freq: Every day | ORAL | Status: DC
  Administered 2018-12-19: 300 mg via ORAL
  Filled 2018-12-19 (×2): qty 1

## 2018-12-19 NOTE — Progress Notes (Signed)
[facility] [place] [facility]      PATIENT NAME: Jean Gomez    MR#:  [id number]  DATE OF BIRTH:  [birth year]  SUBJECTIVE:   Patient admitted to the hospital secondary to shortness of breath and acute on chronic respiratory failure secondary to pneumonia.  Shortness of breath/wheezing/bronchospasm significantly improved since yesterday.  REVIEW OF SYSTEMS:    Review of Systems  Constitutional: Negative for chills and fever.  HENT: Negative for congestion and tinnitus.   Eyes: Negative for blurred vision and double vision.  Respiratory: Positive for shortness of breath. Negative for cough and wheezing.   Cardiovascular: Negative for chest pain, orthopnea and PND.  Gastrointestinal: Negative for abdominal pain, diarrhea, nausea and vomiting.  Genitourinary: Negative for dysuria and hematuria.  Neurological: Negative for dizziness, sensory change and focal weakness.  All other systems reviewed and are negative.   Nutrition: Heart Healthy Tolerating Diet: Yes Tolerating PT: Ambulatory  DRUG ALLERGIES:   Allergies  Allergen Reactions  . Contrast Media [Iodinated Diagnostic Agents] Swelling  . Gabapentin   . Labetalol Other (See Comments)    Made hair fall out  . Sulfabenzamide Nausea Only  . Fentanyl Rash  . Penicillins Rash    VITALS:  Blood pressure (!) 123/92, pulse (!) 110, temperature 98 F (36.7 C), temperature source Oral, resp. rate 15, height 5\' 4"  (1.626 m), weight 72.1 kg, SpO2 96 %.  PHYSICAL EXAMINATION:   Physical Exam  GENERAL:  63 y.o.-year-old patient lying in bed in no acute distress.  EYES: Pupils equal, round, reactive to light and accommodation. No scleral icterus. Extraocular muscles intact.  HEENT: Head atraumatic, normocephalic. Oropharynx and nasopharynx clear.  NECK:  Supple, no jugular venous distention. No thyroid enlargement, no tenderness.  LUNGS: Good a/e b/l, No rales, rhonchi, wheezing, (-) use of accessory  muscles.  CARDIOVASCULAR: S1, S2 normal. No murmurs, rubs, or gallops.  ABDOMEN: Soft, nontender, nondistended. Bowel sounds present. No organomegaly or mass.  EXTREMITIES: No cyanosis, clubbing or edema b/l.    NEUROLOGIC: Cranial nerves II through XII are intact. No focal Motor or sensory deficits b/l.   PSYCHIATRIC: The patient is alert and oriented x 3.  SKIN: No obvious rash, lesion, or ulcer.    LABORATORY PANEL:   CBC Recent Labs  Lab 12/19/18 0406  WBC 7.4  HGB 9.9*  HCT 30.9*  PLT 328   ------------------------------------------------------------------------------------------------------------------  Chemistries  Recent Labs  Lab 12/18/18 0743 12/19/18 0406  NA 134* 137  K 2.4* 3.2*  CL 90* 98  CO2 28 31  GLUCOSE 179* 162*  BUN 7* 8  CREATININE 0.61 0.45  CALCIUM 9.2 8.6*  MG 1.7  --    ------------------------------------------------------------------------------------------------------------------  Cardiac Enzymes Recent Labs  Lab 12/18/18 0743  TROPONINI <0.03   ------------------------------------------------------------------------------------------------------------------  RADIOLOGY:  Dg Chest Port 1 View  Result Date: 12/18/2018 CLINICAL DATA:  Pt presents with SOB. Nonsmoker. Hx of HTN, COPD, chronic respiratory failure, and asthma. EXAM: PORTABLE CHEST - 1 VIEW COMPARISON:  CT 02/11/2018 FINDINGS: Airspace consolidation in the mid and lower right lung, new since previous. Linear scarring or atelectasis at the left lung base, and in the left suprahilar region as before. Heart size normal.  Right hilar fullness. No definite effusion.  No pneumothorax. Visualized bones unremarkable. IMPRESSION: 1. New right mid and lower lung airspace disease suggesting pneumonia, superimposed on chronic pulmonary parenchymal changes. Electronically Signed   By: Corlis Leak M.D.   On: 12/18/2018 08:35  ASSESSMENT AND PLAN:   64 year old female with past medical  history of chronic respiratory failure, sarcoidosis, seizures, chronic migraines, chronic back pain, osteoarthritis who presented to the hospital due to shortness of breath and cough.  1.  Acute on chronic respiratory failure with hypoxia- secondary to pneumonia with underlying sarcoidosis. Weaned off BiPAP.  Continue treatment for pneumonia with IV Levaquin, continue IV steroids, scheduled duo nebs, Pulmicort nebs.  Much improved since yesterday.  2.  Pneumonia-source of patient's worsening respiratory failure with hypoxia. - cont. Levaquin. Follow cultures.   3.  Sarcoidosis/COPD- mild acute flareup due to pneumonia. - cont. IV steroids, scheduled duo nebs, Pulmicort nebs. -Continue antibiotics as mentioned above.    4.  Hypokalemia- improved w/ supplementation and will cont. To monitor.  - mg level is normal.   5.  Essential hypertension-hemodynamically stable. -Continue Norvasc, losartan.  6.  History of seizures-no acute seizure activity.  Continue Keppra.    7.  Depression-continue Lexapro.  8.  Hyperlipidemia-continue atorvastatin.  9.  GERD-continue Protonix.  10.  Chronic back pain- continue Norco, Zanaflex.  Possible d/c tomorrow on Oral pred, antibiotics.   All the records are reviewed and case discussed with Care Management/Social Worker. Management plans discussed with the patient, family and they are in agreement.  CODE STATUS: Full code  DVT Prophylaxis: Lovenox  TOTAL TIME TAKING CARE OF THIS PATIENT: 30 minutes.   POSSIBLE D/C IN 1-2 DAYS, DEPENDING ON CLINICAL CONDITION.   Houston Siren M.D on 12/19/2018 at 2:49 PM  Between 7am to 6pm - Pager - [phone]  After 6pm go to [email or web address] - Social research officer, government  [facility] [place] [facility]  Office  [phone]  CC: Primary care physician; Sherron Monday, MD

## 2018-12-19 NOTE — Telephone Encounter (Signed)
No, I apologize but this is not policy.

## 2018-12-19 NOTE — Telephone Encounter (Signed)
Patient is in hospital for Pneumonia at this time, not sure if she will be released before appt. Can you call one month scripts in and we can reschedule her for a couple weeks out.

## 2018-12-20 ENCOUNTER — Encounter: Payer: Self-pay | Admitting: Nurse Practitioner

## 2018-12-20 ENCOUNTER — Other Ambulatory Visit: Payer: Self-pay

## 2018-12-20 ENCOUNTER — Inpatient Hospital Stay (HOSPITAL_BASED_OUTPATIENT_CLINIC_OR_DEPARTMENT_OTHER): Payer: Medicare Other | Admitting: Nurse Practitioner

## 2018-12-20 VITALS — BP 140/97 | HR 116 | Temp 98.8°F | Resp 16 | Ht 63.0 in | Wt 150.0 lb

## 2018-12-20 DIAGNOSIS — G8929 Other chronic pain: Secondary | ICD-10-CM

## 2018-12-20 DIAGNOSIS — M47816 Spondylosis without myelopathy or radiculopathy, lumbar region: Secondary | ICD-10-CM

## 2018-12-20 DIAGNOSIS — G894 Chronic pain syndrome: Secondary | ICD-10-CM | POA: Diagnosis not present

## 2018-12-20 DIAGNOSIS — M7918 Myalgia, other site: Secondary | ICD-10-CM | POA: Diagnosis not present

## 2018-12-20 DIAGNOSIS — M47813 Spondylosis without myelopathy or radiculopathy, cervicothoracic region: Secondary | ICD-10-CM | POA: Diagnosis not present

## 2018-12-20 DIAGNOSIS — Z79891 Long term (current) use of opiate analgesic: Secondary | ICD-10-CM

## 2018-12-20 DIAGNOSIS — M5441 Lumbago with sciatica, right side: Secondary | ICD-10-CM

## 2018-12-20 LAB — MAGNESIUM: MAGNESIUM: 1.9 mg/dL (ref 1.7–2.4)

## 2018-12-20 LAB — HIV ANTIBODY (ROUTINE TESTING W REFLEX): HIV Screen 4th Generation wRfx: NONREACTIVE

## 2018-12-20 LAB — POTASSIUM: Potassium: 3.8 mmol/L (ref 3.5–5.1)

## 2018-12-20 MED ORDER — PREDNISONE 10 MG PO TABS: ORAL_TABLET | ORAL | 0 refills | Status: DC

## 2018-12-20 MED ORDER — LEVOFLOXACIN 750 MG PO TABS: 750.0000 mg | ORAL_TABLET | Freq: Every day | ORAL | 0 refills | Status: AC

## 2018-12-20 MED ORDER — TIZANIDINE HCL 4 MG PO TABS: 4.0000 mg | ORAL_TABLET | Freq: Three times a day (TID) | ORAL | 1 refills | Status: DC

## 2018-12-20 MED ORDER — HYDROCODONE-ACETAMINOPHEN 10-325 MG PO TABS: 1.0000 | ORAL_TABLET | Freq: Four times a day (QID) | ORAL | 0 refills | Status: DC | PRN

## 2018-12-20 MED ORDER — LIDOCAINE 5 % EX OINT: 1.0000 "application " | TOPICAL_OINTMENT | Freq: Four times a day (QID) | CUTANEOUS | 2 refills | Status: DC | PRN

## 2018-12-20 NOTE — Progress Notes (Signed)
Nursing Pain Medication Assessment:  Safety precautions to be maintained throughout the outpatient stay will include: orient to surroundings, keep bed in low position, maintain call bell within reach at all times, provide assistance with transfer out of bed and ambulation.  Medication Inspection Compliance: Ms. Rheams did not comply with our request to bring her pills to be counted. She was reminded that bringing the medication bottles, even when empty, is a requirement.  Medication: None brought in. Pill/Patch Count: None available to be counted. Bottle Appearance: No container available. Did not bring bottle(s) to appointment. Filled Date: N/A Last Medication intake:  Yesterday

## 2018-12-20 NOTE — Progress Notes (Signed)
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 (Neurological): Unimpaired          Palpation: No palpable anomalies              Palpation: No palpable anomalies                   Thoracic Spine Area Exam  Skin & Axial Inspection: No masses, redness, or swelling Alignment: Symmetrical Functional ROM: Unrestricted ROM Stability: No instability detected Muscle Tone/Strength: Functionally intact. No obvious neuro-muscular anomalies detected. Sensory (Neurological): Unimpaired Muscle strength & Tone: No palpable anomalies  Lumbar Spine Area Exam  Skin & Axial Inspection: No masses, redness, or swelling Alignment: Symmetrical Functional ROM: Unrestricted ROM       Stability: No instability detected Muscle Tone/Strength: Functionally intact. No obvious neuro-muscular anomalies detected. Sensory (Neurological): Unimpaired Palpation: Tender       Provocative Tests: Hyperextension/rotation test: Positive bilaterally for facet joint  pain. Lumbar quadrant test (Kemp's test): deferred today       Lateral bending test: deferred today         Gait & Posture Assessment  Ambulation: Patient ambulates using a wheel chair Gait: Relatively normal for age and body habitus Posture: WNL   Lower Extremity Exam    Side: Right lower extremity  Side: Left lower extremity  Stability: No instability observed          Stability: No instability observed          Skin & Extremity Inspection: Skin color, temperature, and hair growth are WNL. No peripheral edema or cyanosis. No masses, redness, swelling, asymmetry, or associated skin lesions. No contractures.  Skin & Extremity Inspection: Skin color, temperature, and hair growth are WNL. No peripheral edema or cyanosis. No masses, redness, swelling, asymmetry, or associated skin lesions. No contractures.  Functional ROM: Unrestricted ROM                  Functional ROM: Unrestricted ROM                  Muscle Tone/Strength: Functionally intact. No obvious neuro-muscular anomalies detected.  Muscle Tone/Strength: Functionally intact. No obvious neuro-muscular anomalies detected.  Sensory (Neurological): Unimpaired        Sensory (Neurological): Unimpaired            Palpation: No palpable anomalies  Palpation: No palpable anomalies   Assessment  Primary Diagnosis & Pertinent Problem List: The primary encounter diagnosis was Lumbar spondylosis. Diagnoses of Spondylosis without myelopathy or radiculopathy, cervicothoracic region, Chronic musculoskeletal pain, Chronic pain syndrome, Chronic low back pain (Midline) (Secondary Area of Pain), and Long term prescription opiate use were also pertinent to this visit.  Status Diagnosis  Persistent Persistent Controlled 1. Lumbar spondylosis   2. Spondylosis without myelopathy or radiculopathy, cervicothoracic region  3. Chronic musculoskeletal pain   4. Chronic pain syndrome   5. Chronic low back pain (Midline) (Secondary Area of Pain)   6.  Long term prescription opiate use     Problems updated and reviewed during this visit: No problems updated. Plan of Care  Pharmacotherapy (Medications Ordered): Meds ordered this encounter  Medications  . HYDROcodone-acetaminophen (NORCO) 10-325 MG tablet    Sig: Take 1 tablet by mouth every 6 (six) hours as needed for severe pain.    Dispense:  120 tablet    Refill:  0    Do not place this medication, or any other prescription from our practice, on "Automatic Refill". Patient may have prescription filled one day early if pharmacy is closed on scheduled refill date.    Order Specific Question:   Supervising Provider    Answer:   Milinda Pointer [phone]  . HYDROcodone-acetaminophen (NORCO) 10-325 MG tablet    Sig: Take 1 tablet by mouth every 6 (six) hours as needed for severe pain.    Dispense:  120 tablet    Refill:  0    Do not add this medication to the electronic "Automatic Refill" notification system. Patient may have prescription filled one day early if pharmacy is closed on scheduled refill date.    Order Specific Question:   Supervising Provider    Answer:   Milinda Pointer [phone]  . HYDROcodone-acetaminophen (NORCO) 10-325 MG tablet    Sig: Take 1 tablet by mouth every 6 (six) hours as needed for severe pain.    Dispense:  120 tablet    Refill:  0    Do not place this medication, or any other prescription from our practice, on "Automatic Refill". Patient may have prescription filled one day early if pharmacy is closed on scheduled refill date.    Order Specific Question:   Supervising Provider    Answer:   Milinda Pointer [phone]  . tiZANidine (ZANAFLEX) 4 MG tablet    Sig: Take 1 tablet (4 mg total) by mouth 3 (three) times daily.    Dispense:  90 tablet    Refill:  1    Do not place medication on "Automatic Refill". Fill one day early if pharmacy is closed on scheduled refill date.    Order Specific Question:   Supervising Provider    Answer:   Milinda Pointer [[id number]]   New Prescriptions   No medications on file   Medications administered today: Karina F. Schicker had no medications administered during this visit. Lab-work, procedure(s), and/or referral(s): Orders Placed This Encounter  Procedures  . Radiofrequency,Lumbar  . ToxASSURE Select 13 (MW), Urine   Imaging and/or referral(s): None  Interventional management options: Planned, scheduled, and/or pending:   Diagnostic right lumbar facet RFA#1under fluoroscopic guidance and IV sedation   Considering:   Diagnostic bilateral greater occipital nerve block #2 Possible bilateral occipital nerve RFA Possible bilateral occipital nerve peripheral nerves stimulator trial Diagnostic bilateral cervical facet block Possible bilateral cervical facet RFA Diagnostic right-sided cervical epidural steroid injection Diagnostic bilateral intra-articular shoulder joint injection Diagnostic bilateral suprascapular nerve block Possible bilateral suprascapular nerve RFA Diagnostic bilateral lumbar facet block Possible bilateral lumbar facet RFA Diagnostic right-sided lumbar epidural steroid injection Diagnostic bilateral transforaminal epidural steroid injection Diagnostic bilateral intra-articular hip joint injection Diagnostic bilateral femoral nerve + obturator nerve block Possible bilateral femoral nerve + obturator nerve RFA Diagnostic bilateral intra-articular knee joint injection with local anesthetic and steroid Possible series of 5 bilateral intra-articular Hyalgan knee injections(series #2) (series #  1 completed on 07/04/2018) Diagnostic bilateral genicular nerve block Possible bilateral genicular nerve RFA   Palliative PRN treatment(s):   None at this time    Provider-requested follow-up: Return in about 3 months (around 03/21/2019) for MedMgmt, w/ Dr. Dossie Arbour, Procedure(w/Sedation), (R) L Fct RFA.  Future Appointments  Date Time Provider [place]  03/17/2019  9:00 AM Vevelyn Francois, NP [facility] None   Primary Care Physician: Jodi Marble, MD Location: [facility] Note by: Vevelyn Francois NP Date: 12/20/2018; Time: 1:00 PM  Pain Score Disclaimer: We use the NRS-11 scale. This is a self-reported, subjective measurement of pain severity with only modest accuracy. It is used primarily to identify changes within a particular patient. It must be understood that outpatient pain scales are significantly less accurate that those used for research, where they can be applied under ideal controlled circumstances with minimal exposure to variables. In reality, the score is likely to be a combination of pain intensity and pain affect, where pain affect describes the degree of emotional arousal or changes in action readiness caused by the sensory experience of pain. Factors such as social and work situation, setting, emotional state, anxiety levels, expectation, and prior pain experience may influence pain perception and show large inter-individual differences that may also be affected by time variables.  Patient instructions provided during this appointment: Patient Instructions  ____________________________________________________________________________________________  Medication Rules  Purpose: To inform patients, and their family members, of our rules and regulations.  Applies to: All patients receiving prescriptions (written or electronic).  Pharmacy of record: Pharmacy where electronic prescriptions will be sent. If written prescriptions are taken to a different pharmacy, please inform the nursing staff. The pharmacy listed in the electronic medical record should be the one where you would like electronic prescriptions to be sent.  Electronic prescriptions: In compliance with the [place] (STOP) Act of 2017 (Session Lanny Cramp [phone]), effective December 18, 2018,  all controlled substances must be electronically prescribed. Calling prescriptions to the pharmacy will cease to exist.  Prescription refills: Only during scheduled appointments. Applies to all prescriptions.  NOTE: The following applies primarily to controlled substances (Opioid* Pain Medications).   Patient's responsibilities: 1. Pain Pills: Bring all pain pills to every appointment (except for procedure appointments). 2. Pill Bottles: Bring pills in original pharmacy bottle. Always bring the newest bottle. Bring bottle, even if empty. 3. Medication refills: You are responsible for knowing and keeping track of what medications you take and those you need refilled. The day before your appointment: write a list of all prescriptions that need to be refilled. The day of the appointment: give the list to the admitting nurse. Prescriptions will be written only during appointments. If you forget a medication: it will not be "Called in", "Faxed", or "electronically sent". You will need to get another appointment to get these prescribed. No early refills. Do not call asking to have your prescription filled early. 4. Prescription Accuracy: You are responsible for carefully inspecting your prescriptions before leaving our office. Have the discharge nurse carefully go over each prescription with you, before taking them home. Make sure that your name is accurately spelled, that your address is correct. Check the name and dose of your medication to make sure it is accurate. Check the number of pills, and the written instructions to make sure they are clear and accurate. Make sure that you are given enough medication to last until your next medication refill appointment. 5.  Taking Medication: Take medication as prescribed. When it comes to controlled substances, taking less pills or less frequently than prescribed is permitted and encouraged. Never take more pills than instructed. Never take medication more  frequently than prescribed.  6. Inform other Doctors: Always inform, all of your healthcare providers, of all the medications you take. 7. Pain Medication from other Providers: You are not allowed to accept any additional pain medication from any other Doctor or Healthcare provider. There are two exceptions to this rule. (see below) In the event that you require additional pain medication, you are responsible for notifying us, as stated below. 8. Medication Agreement: You are responsible for carefully reading and following our Medication Agreement. This must be signed before receiving any prescriptions from our practice. Safely store a copy of your signed Agreement. Violations to the Agreement will result in no further prescriptions. (Additional copies of our Medication Agreement are available upon request.) 9. Laws, Rules, & Regulations: All patients are expected to follow all Federal and [employer], [employer], Rules, [employer]. Ignorance of the Laws does not constitute a valid excuse. The use of any illegal substances is prohibited. 10. Adopted CDC guidelines & recommendations: Target dosing levels will be at or below 60 MME/day. Use of benzodiazepines** is not recommended.  Exceptions: There are only two exceptions to the rule of not receiving pain medications from other Healthcare Providers. 1. Exception #1 (Emergencies): In the event of an emergency (i.e.: accident requiring emergency care), you are allowed to receive additional pain medication. However, you are responsible for: As soon as you are able, call our office [phone], at any time of the day or night, and leave a message stating your name, the date and nature of the emergency, and the name and dose of the medication prescribed. In the event that your call is answered by a member of our staff, make sure to document and save the date, time, and the name of the person that took your information.  2. Exception #2 (Planned Surgery): In  the event that you are scheduled by another doctor or dentist to have any type of surgery or procedure, you are allowed (for a period no longer than 30 days), to receive additional pain medication, for the acute post-op pain. However, in this case, you are responsible for picking up a copy of our "Post-op Pain Management for Surgeons" handout, and giving it to your surgeon or dentist. This document is available at our office, and does not require an appointment to obtain it. Simply go to our office during business hours (Monday-Thursday from 8:00 AM to 4:00 PM) (Friday 8:00 AM to 12:00 Noon) or if you have a scheduled appointment with [place], prior to your surgery, and ask for it by name. In addition, you will need to provide [place] with your name, name of your surgeon, type of surgery, and date of procedure or surgery.  *Opioid medications include: morphine, codeine, oxycodone, oxymorphone, hydrocodone, hydromorphone, meperidine, tramadol, tapentadol, buprenorphine, fentanyl, methadone. **Benzodiazepine medications include: diazepam (Valium), alprazolam (Xanax), clonazepam (Klonopine), lorazepam (Ativan), clorazepate (Tranxene), chlordiazepoxide (Librium), estazolam (Prosom), oxazepam (Serax), temazepam (Restoril), triazolam (Halcion) (Last updated: 02/14/2018) ____________________________________________________________________________________________   ____________________________________________________________________________________________  Preparing for Procedure with Sedation  Instructions: . Oral Intake: Do not eat or drink anything for at least 8 hours prior to your procedure. . Transportation: Public transportation is not allowed. Bring an adult driver. The driver must be physically present in our waiting room before any procedure can be started. Marland Kitchen Physical Assistance: Bring  an adult physically capable of assisting you, in the event you need help. This adult should keep you company at home for at  least 6 hours after the procedure. . Blood Pressure Medicine: Take your blood pressure medicine with a sip of water the morning of the procedure. . Blood thinners: Notify our staff if you are taking any blood thinners. Depending on which one you take, there will be specific instructions on how and when to stop it. . Diabetics on insulin: Notify the staff so that you can be scheduled 1st case in the morning. If your diabetes requires high dose insulin, take only  of your normal insulin dose the morning of the procedure and notify the staff that you have done so. . Preventing infections: Shower with an antibacterial soap the morning of your procedure. . Build-up your immune system: Take 1000 mg of Vitamin C with every meal (3 times a day) the day prior to your procedure. Marland Kitchen Antibiotics: Inform the staff if you have a condition or reason that requires you to take antibiotics before dental procedures. . Pregnancy: If you are pregnant, call and cancel the procedure. . Sickness: If you have a cold, fever, or any active infections, call and cancel the procedure. . Arrival: You must be in the facility at least 30 minutes prior to your scheduled procedure. . Children: Do not bring children with you. . Dress appropriately: Bring dark clothing that you would not mind if they get stained. . Valuables: Do not bring any jewelry or valuables.  Procedure appointments are reserved for interventional treatments only. Marland Kitchen No Prescription Refills. . No medication changes will be discussed during procedure appointments. . No disability issues will be discussed.  Reasons to call and reschedule or cancel your procedure: (Following these recommendations will minimize the risk of a serious complication.) . Surgeries: Avoid having procedures within 2 weeks of any surgery. (Avoid for 2 weeks before or after any surgery). . Flu Shots: Avoid having procedures within 2 weeks of a flu shots or . (Avoid for 2 weeks before or  after immunizations). . Barium: Avoid having a procedure within 7-10 days after having had a radiological study involving the use of radiological contrast. (Myelograms, Barium swallow or enema study). . Heart attacks: Avoid any elective procedures or surgeries for the initial 6 months after a "Myocardial Infarction" (Heart Attack). . Blood thinners: It is imperative that you stop these medications before procedures. Let us know if you if you take any blood thinner.  . Infection: Avoid procedures during or within two weeks of an infection (including chest colds or gastrointestinal problems). Symptoms associated with infections include: Localized redness, fever, chills, night sweats or profuse sweating, burning sensation when voiding, cough, congestion, stuffiness, runny nose, sore throat, diarrhea, nausea, vomiting, cold or Flu symptoms, recent or current infections. It is specially important if the infection is over the area that we intend to treat. Marland Kitchen Heart and lung problems: Symptoms that may suggest an active cardiopulmonary problem include: cough, chest pain, breathing difficulties or shortness of breath, dizziness, ankle swelling, uncontrolled high or unusually low blood pressure, and/or palpitations. If you are experiencing any of these symptoms, cancel your procedure and contact your primary care physician for an evaluation.  Remember:  Regular Business hours are:  Monday to Thursday 8:00 AM to 4:00 PM  Provider's Schedule: Milinda Pointer, MD:  Procedure days: Tuesday and Thursday 7:30 AM to 4:00 PM  Gillis Santa, MD:  Procedure days: Monday and Wednesday 7:30 AM to  4:00 PM ____________________________________________________________________________________________  Radiofrequency Lesioning Radiofrequency lesioning is a procedure that is performed to relieve pain. The procedure is often used for back, neck, or arm pain. Radiofrequency lesioning involves the use of a machine that creates  radio waves to make heat. During the procedure, the heat is applied to the nerve that carries the pain signal. The heat damages the nerve and interferes with the pain signal. Pain relief usually starts about 2 weeks after the procedure and lasts for 6 months to 1 year. Tell a health care provider about:  Any allergies you have.  All medicines you are taking, including vitamins, herbs, eye drops, creams, and over-the-counter medicines.  Any problems you or family members have had with anesthetic medicines.  Any blood disorders you have.  Any surgeries you have had.  Any medical conditions you have.  Whether you are pregnant or may be pregnant. What are the risks? Generally, this is a safe procedure. However, problems may occur, including:  Pain or soreness at the injection site.  Infection at the injection site.  Damage to nerves or blood vessels. What happens before the procedure?  Ask your health care provider about: ? Changing or stopping your regular medicines. This is especially important if you are taking diabetes medicines or blood thinners. ? Taking medicines such as aspirin and ibuprofen. These medicines can thin your blood. Do not take these medicines before your procedure if your health care provider instructs you not to.  Follow instructions from your health care provider about eating or drinking restrictions.  Plan to have someone take you home after the procedure.  If you go home right after the procedure, plan to have someone with you for 24 hours. What happens during the procedure?  You will be given one or more of the following: ? A medicine to help you relax (sedative). ? A medicine to numb the area (local anesthetic).  You will be awake during the procedure. You will need to be able to talk with the health care provider during the procedure.  With the help of a type of X-ray (fluoroscopy), the health care provider will insert a radiofrequency needle into  the area to be treated.  Next, a wire that carries the radio waves (electrode) will be put through the radiofrequency needle. An electrical pulse will be sent through the electrode to verify the correct nerve. You will feel a tingling sensation, and you may have muscle twitching.  Then, the tissue that is around the needle tip will be heated by an electric current that is passed using the radiofrequency machine. This will numb the nerves.  A bandage (dressing) will be put on the insertion area after the procedure is done. The procedure may vary among health care providers and hospitals. What happens after the procedure?  Your blood pressure, heart rate, breathing rate, and blood oxygen level will be monitored often until the medicines you were given have worn off.  Return to your normal activities as directed by your health care provider. This information is not intended to replace advice given to you by your health care provider. Make sure you discuss any questions you have with your health care provider. Document Released: 08/02/2011 Document Revised: 05/11/2016 Document Reviewed: 01/11/2015 Elsevier Interactive Patient Education  2019 [employer].

## 2018-12-20 NOTE — Progress Notes (Signed)
Discharge instructions discussed. O2 set to 2L on personal concentrator.

## 2018-12-20 NOTE — Care Management Note (Signed)
Case Management Note  Patient Details  Name: Jean Gomez MRN: [id number] Date of Birth: [birth year]  Subjective/Objective:                  Met with patient to discuss discharge needs Patient has a cane and a walker and home oxygen in place and has no other DME needs Patient uses [facility] and can afford her meds Patient lives in an apartment and has friends come by and check on her every day Patient has transportation and gets to appointments fine Patient has a follow up appointment already scheduled with PCP    Action/Plan:  Expected Discharge Date:  12/20/18               Expected Discharge Plan:  Home/Self Care  In-House Referral:     Discharge planning Services  CM Consult  Post Acute Care Choice:  Home Health Choice offered to:  Patient  DME Arranged:    DME Agency:     HH Arranged:    [place] Agency:     Status of Service:  Completed, signed off  If discussed at [employer], dates discussed:    Additional Comments:  Su Hilt, RN 12/20/2018, 10:15 AM

## 2018-12-20 NOTE — Discharge Summary (Signed)
[facility] [place] [facility]   PATIENT NAME: Jean Gomez    MR#:  [id number]  DATE OF BIRTH:  06-25-1956  DATE OF ADMISSION:  12/18/2018 ADMITTING PHYSICIAN: Houston Siren, MD  DATE OF DISCHARGE: 12/20/2018 11:13 AM  PRIMARY CARE PHYSICIAN: Sherron Monday, MD    ADMISSION DIAGNOSIS:  SOB (shortness of breath) [R06.02] Hypoxia [R09.02] Pneumonia of right lung due to infectious organism, unspecified part of lung [J18.9]  DISCHARGE DIAGNOSIS:  Active Problems:   Acute on chronic respiratory failure with hypoxia (HCC)   SECONDARY DIAGNOSIS:   Past Medical History:  Diagnosis Date  . Allergy   . Asthma   . Cerebral aneurysm   . Chest pain 11/01/2016  . Chronic migraine   . Chronic respiratory failure (HCC)   . COPD (chronic obstructive pulmonary disease) (HCC)   . Hypertension   . Osteoarthritis   . Pneumonia    12/2018  . Sarcoid   . Seizures [facility])     HOSPITAL COURSE:   63 year old female with past medical history of chronic respiratory failure, sarcoidosis, seizures, chronic migraines, chronic back pain, osteoarthritis who presented to the hospital due to shortness of breath and cough.  1. Acute on chronic respiratory failure with hypoxia- secondary to pneumonia with underlying sarcoidosis. -Initially when patient was admitted to the hospital in the ER she was on BiPAP but quickly weaned off of it. - She was treated for her respiratory failure with IV steroids scheduled duo nebs, Pulmicort nebs and given IV Levaquin for her pneumonia.  She has improved and is now being discharged.  She will continue her oxygen at 2-3 L. -She will continue a prednisone taper, empiric Levaquin for a few more days.  2. Pneumonia-source of patient's worsening respiratory failure with hypoxia. -Patient was treated with IV Levaquin while in the hospital and now being discharged on oral Levaquin for additional few days.  3. Sarcoidosis/COPD- mild acute  flareup due to pneumonia. -Patient was treated with IV steroids, scheduled duo nebs, Pulmicort nebs.  She has improved.  Now being discharged on oral prednisone taper and empiric Levaquin.  She will continue her inhalers as stated below.  4. Hypokalemia- improved and resolved with supplementation.   5. Essential hypertension-hemodynamically stable. -pt. Will Continue Norvasc, losartan.  6. History of seizures-no acute seizure activity. Pt. willContinue Keppra.   7. Depression- pt. Will continue Lexapro.  8. Hyperlipidemia-pt. Will continue atorvastatin.  9. GERD-pt. Will continue Protonix.  10. Chronic back pain- continue Norco, Zanaflex. - pt. Will follow up with outpatient pain management.   DISCHARGE CONDITIONS:   Stable.   CONSULTS OBTAINED:    DRUG ALLERGIES:   Allergies  Allergen Reactions  . Contrast Media [Iodinated Diagnostic Agents] Swelling  . Gabapentin   . Labetalol Other (See Comments)    Made hair fall out  . Sulfabenzamide Nausea Only  . Fentanyl Rash  . Penicillins Rash    DISCHARGE MEDICATIONS:   Allergies as of 12/20/2018      Reactions   Contrast Media [iodinated Diagnostic Agents] Swelling   Gabapentin    Labetalol Other (See Comments)   Made hair fall out   Sulfabenzamide Nausea Only   Fentanyl Rash   Penicillins Rash      Medication List    STOP taking these medications   doxycycline 20 MG tablet Commonly known as:  PERIOSTAT     TAKE these medications   Albuterol Sulfate 108 (90 Base) MCG/ACT Aepb Inhale 2 puffs into the  lungs every 6 (six) hours as needed.   amLODipine 10 MG tablet Commonly known as:  NORVASC Take 1 tablet (10 mg total) by mouth daily.   aspirin EC 81 MG tablet Take 1 tablet (81 mg total) by mouth daily.   atorvastatin 20 MG tablet Commonly known as:  LIPITOR Take 20 mg by mouth daily. What changed:  Another medication with the same name was removed. Continue taking this medication, and  follow the directions you see here.   betamethasone (augmented) 0.05 % lotion Commonly known as:  DIPROLENE Apply topically 2 (two) times daily.   citalopram 20 MG tablet Commonly known as:  CELEXA Take 20 mg by mouth daily.   clotrimazole-betamethasone cream Commonly known as:  LOTRISONE Apply 1 application topically 2 (two) times daily.   diclofenac sodium 1 % Gel Commonly known as:  VOLTAREN Apply 4 g topically 4 (four) times daily.   hydrochlorothiazide 25 MG tablet Commonly known as:  HYDRODIURIL Take 1 tablet (25 mg total) by mouth daily.   ipratropium 0.02 % nebulizer solution Commonly known as:  ATROVENT Take 0.5 mg by nebulization every 4 (four) hours as needed for wheezing or shortness of breath.   levETIRAcetam 500 MG tablet Commonly known as:  KEPPRA Take 500 mg by mouth 2 (two) times daily.   levofloxacin 750 MG tablet Commonly known as:  LEVAQUIN Take 1 tablet (750 mg total) by mouth daily for 5 days.   lidocaine 5 % ointment Commonly known as:  XYLOCAINE Apply 1 application topically 4 (four) times daily as needed for moderate pain.   LINZESS 145 MCG Caps capsule Generic drug:  linaclotide Take 145 mcg by mouth daily before breakfast.   losartan 25 MG tablet Commonly known as:  COZAAR Take 1 tablet (25 mg total) by mouth daily.   nitroGLYCERIN 0.4 MG SL tablet Commonly known as:  NITROSTAT Place 0.4 mg under the tongue every 5 (five) minutes as needed for chest pain.   ondansetron 4 MG tablet Commonly known as:  ZOFRAN Take 4 mg by mouth every 8 (eight) hours as needed for nausea or vomiting.   pantoprazole 40 MG tablet Commonly known as:  PROTONIX Take 1 tablet (40 mg total) by mouth daily.   potassium chloride SA 20 MEQ tablet Commonly known as:  K-DUR,KLOR-CON Take 1 tablet (20 mEq total) by mouth 2 (two) times daily.   predniSONE 10 MG tablet Commonly known as:  DELTASONE Label  & dispense according to the schedule below. 5 Pills PO  for 1 day then, 4 Pills PO for 1 day, 3 Pills PO for 1 day, 2 Pills PO for 1 day, 1 Pill PO for 1 days then STOP.   QUEtiapine 300 MG tablet Commonly known as:  SEROQUEL Take 300 mg by mouth at bedtime.   sucralfate 1 g tablet Commonly known as:  CARAFATE Take 1 tablet (1 g total) by mouth 4 (four) times daily.   torsemide 5 MG tablet Commonly known as:  DEMADEX   venlafaxine 75 MG tablet Commonly known as:  EFFEXOR TK 1 T PO BID         DISCHARGE INSTRUCTIONS:   DIET:  Cardiac diet  DISCHARGE CONDITION:  Stable  ACTIVITY:  Activity as tolerated  OXYGEN:  Home Oxygen: Yes.     Oxygen Delivery: 2 liters/min via Patient connected to nasal cannula oxygen  DISCHARGE LOCATION:  home   If you experience worsening of your admission symptoms, develop shortness of breath, life threatening emergency,  suicidal or homicidal thoughts you must seek medical attention immediately by calling 911 or calling your MD immediately  if symptoms less severe.  You Must read complete instructions/literature along with all the possible adverse reactions/side effects for all the Medicines you take and that have been prescribed to you. Take any new Medicines after you have completely understood and accpet all the possible adverse reactions/side effects.   Please note  You were cared for by a hospitalist during your hospital stay. If you have any questions about your discharge medications or the care you received while you were in the hospital after you are discharged, you can call the unit and asked to speak with the hospitalist on call if the hospitalist that took care of you is not available. Once you are discharged, your primary care physician will handle any further medical issues. Please note that NO REFILLS for any discharge medications will be authorized once you are discharged, as it is imperative that you return to your primary care physician (or establish a relationship with a primary  care physician if you do not have one) for your aftercare needs so that they can reassess your need for medications and monitor your lab values.     Today   Shortness of breath, wheezing much improved since admission.  She will be discharged home on oral antibiotics and a prednisone taper.  Patient is in agreement with this plan.  VITAL SIGNS:  Blood pressure (!) 148/98, pulse (!) 111, temperature 97.9 F (36.6 C), temperature source Oral, resp. rate 20, height 5\' 4"  (1.626 m), weight 72.1 kg, SpO2 96 %.  I/O:    Intake/Output Summary (Last 24 hours) at 12/20/2018 1651 Last data filed at 12/20/2018 0200 Gross per 24 hour  Intake 550.71 ml  Output -  Net 550.71 ml    PHYSICAL EXAMINATION:   GENERAL:  63 y.o.-year-old patient lying in bed in no acute distress.  EYES: Pupils equal, round, reactive to light and accommodation. No scleral icterus. Extraocular muscles intact.  HEENT: Head atraumatic, normocephalic. Oropharynx and nasopharynx clear.  NECK:  Supple, no jugular venous distention. No thyroid enlargement, no tenderness.  LUNGS: Good a/e b/l, No rales, rhonchi, wheezing, (-) use of accessory muscles.  CARDIOVASCULAR: S1, S2 normal. No murmurs, rubs, or gallops.  ABDOMEN: Soft, nontender, nondistended. Bowel sounds present. No organomegaly or mass.  EXTREMITIES: No cyanosis, clubbing or edema b/l.    NEUROLOGIC: Cranial nerves II through XII are intact. No focal Motor or sensory deficits b/l.   PSYCHIATRIC: The patient is alert and oriented x 3.  SKIN: No obvious rash, lesion, or ulcer.   DATA REVIEW:   CBC Recent Labs  Lab 12/19/18 0406  WBC 7.4  HGB 9.9*  HCT 30.9*  PLT 328    Chemistries  Recent Labs  Lab 12/19/18 0406 12/20/18 0546  NA 137  --   K 3.2* 3.8  CL 98  --   CO2 31  --   GLUCOSE 162*  --   BUN 8  --   CREATININE 0.45  --   CALCIUM 8.6*  --   MG  --  1.9    Cardiac Enzymes Recent Labs  Lab 12/18/18 0743  TROPONINI <0.03     Microbiology Results  Results for orders placed or performed during the hospital encounter of 12/18/18  Culture, blood (routine x 2)     Status: None (Preliminary result)   Collection Time: 12/18/18  7:56 AM  Result Value Ref Range Status  Specimen Description BLOOD LEFT HAND  Final   Special Requests   Final    BOTTLES DRAWN AEROBIC AND ANAEROBIC Blood Culture adequate volume   Culture   Final    NO GROWTH < 24 HOURS Performed at [facility], [address] [place], [place] [address]    Report Status PENDING  Incomplete  Respiratory Panel by PCR     Status: None   Collection Time: 12/18/18  7:56 AM  Result Value Ref Range Status   Adenovirus NOT DETECTED NOT DETECTED Final   Coronavirus 229E NOT DETECTED NOT DETECTED Final   Coronavirus HKU1 NOT DETECTED NOT DETECTED Final   Coronavirus NL63 NOT DETECTED NOT DETECTED Final   Coronavirus OC43 NOT DETECTED NOT DETECTED Final   Metapneumovirus NOT DETECTED NOT DETECTED Final   Rhinovirus / Enterovirus NOT DETECTED NOT DETECTED Final   Influenza A NOT DETECTED NOT DETECTED Final   Influenza B NOT DETECTED NOT DETECTED Final   Parainfluenza Virus 1 NOT DETECTED NOT DETECTED Final   Parainfluenza Virus 2 NOT DETECTED NOT DETECTED Final   Parainfluenza Virus 3 NOT DETECTED NOT DETECTED Final   Parainfluenza Virus 4 NOT DETECTED NOT DETECTED Final   Respiratory Syncytial Virus NOT DETECTED NOT DETECTED Final   Bordetella pertussis NOT DETECTED NOT DETECTED Final   Chlamydophila pneumoniae NOT DETECTED NOT DETECTED Final   Mycoplasma pneumoniae NOT DETECTED NOT DETECTED Final    Comment: Performed at [facility], [address] [place], [place] [address]  Culture, blood (routine x 2)     Status: None (Preliminary result)   Collection Time: 12/18/18  8:25 AM  Result Value Ref Range Status   Specimen Description BLOOD RIGHT WRIST  Final   Special Requests   Final    BOTTLES DRAWN AEROBIC AND ANAEROBIC Blood  Culture results Gomez not be optimal due to an inadequate volume of blood received in culture bottles   Culture   Final    NO GROWTH < 24 HOURS Performed at [facility], [address] [place], [place] [address]    Report Status PENDING  Incomplete    RADIOLOGY:  No results found.    Management plans discussed with the patient, family and they are in agreement.  CODE STATUS:  Code Status History    Date Active Date Inactive Code Status Order ID Comments User Context   12/18/2018 1137 12/20/2018 1440 Full Code [id number]  Houston SirenSainani, Vivek J, MD Inpatient   TOTAL TIME TAKING CARE OF THIS PATIENT: 40 minutes.    Houston SirenSAINANI,VIVEK J M.D on 12/20/2018 at 4:51 PM  Between 7am to 6pm - Pager - [phone]  After 6pm go to [email or web address] - Social research [email or web address], [email or web address] [email or web address]  [facility]  Office  [phone]  CC: Primary care physician; Sherron Mondayejan-[name], S Ahmed, MD

## 2018-12-20 NOTE — Patient Instructions (Addendum)
____________________________________________________________________________________________  Medication Rules  Purpose: To inform patients, and their family members, of our rules and regulations.  Applies to: All patients receiving prescriptions (written or electronic).  Pharmacy of record: Pharmacy where electronic prescriptions will be sent. If written prescriptions are taken to a different pharmacy, please inform the nursing staff. The pharmacy listed in the electronic medical record should be the one where you would like electronic prescriptions to be sent.  Electronic prescriptions: In compliance with the [place] Strengthen Opioid Misuse Prevention (STOP) Act of 2017 (Session Law 2017-74/H243), effective December 18, 2018, all controlled substances must be electronically prescribed. Calling prescriptions to the pharmacy will cease to exist.  Prescription refills: Only during scheduled appointments. Applies to all prescriptions.  NOTE: The following applies primarily to controlled substances (Opioid* Pain Medications).   Patient's responsibilities: 1. Pain Pills: Bring all pain pills to every appointment (except for procedure appointments). 2. Pill Bottles: Bring pills in original pharmacy bottle. Always bring the newest bottle. Bring bottle, even if empty. 3. Medication refills: You are responsible for knowing and keeping track of what medications you take and those you need refilled. The day before your appointment: write a list of all prescriptions that need to be refilled. The day of the appointment: give the list to the admitting nurse. Prescriptions will be written only during appointments. If you forget a medication: it will not be "Called in", "Faxed", or "electronically sent". You will need to get another appointment to get these prescribed. No early refills. Do not call asking to have your prescription filled early. 4. Prescription Accuracy: You are responsible for  carefully inspecting your prescriptions before leaving our office. Have the discharge nurse carefully go over each prescription with you, before taking them home. Make sure that your name is accurately spelled, that your address is correct. Check the name and dose of your medication to make sure it is accurate. Check the number of pills, and the written instructions to make sure they are clear and accurate. Make sure that you are given enough medication to last until your next medication refill appointment. 5. Taking Medication: Take medication as prescribed. When it comes to controlled substances, taking less pills or less frequently than prescribed is permitted and encouraged. Never take more pills than instructed. Never take medication more frequently than prescribed.  6. Inform other Doctors: Always inform, all of your healthcare providers, of all the medications you take. 7. Pain Medication from other Providers: You are not allowed to accept any additional pain medication from any other Doctor or Healthcare provider. There are two exceptions to this rule. (see below) In the event that you require additional pain medication, you are responsible for notifying us, as stated below. 8. Medication Agreement: You are responsible for carefully reading and following our Medication Agreement. This must be signed before receiving any prescriptions from our practice. Safely store a copy of your signed Agreement. Violations to the Agreement will result in no further prescriptions. (Additional copies of our Medication Agreement are available upon request.) 9. Laws, Rules, & Regulations: All patients are expected to follow all Federal and State Laws, Statutes, Rules, & Regulations. Ignorance of the Laws does not constitute a valid excuse. The use of any illegal substances is prohibited. 10. Adopted CDC guidelines & recommendations: Target dosing levels will be at or below 60 MME/day. Use of benzodiazepines** is not  recommended.  Exceptions: There are only two exceptions to the rule of not receiving pain medications from other Healthcare Providers. 1.   Exception #1 (Emergencies): In the event of an emergency (i.e.: accident requiring emergency care), you are allowed to receive additional pain medication. However, you are responsible for: As soon as you are able, call our office [phone], at any time of the day or night, and leave a message stating your name, the date and nature of the emergency, and the name and dose of the medication prescribed. In the event that your call is answered by a member of our staff, make sure to document and save the date, time, and the name of the person that took your information.  2. Exception #2 (Planned Surgery): In the event that you are scheduled by another doctor or dentist to have any type of surgery or procedure, you are allowed (for a period no longer than 30 days), to receive additional pain medication, for the acute post-op pain. However, in this case, you are responsible for picking up a copy of our "Post-op Pain Management for Surgeons" handout, and giving it to your surgeon or dentist. This document is available at our office, and does not require an appointment to obtain it. Simply go to our office during business hours (Monday-Thursday from 8:00 AM to 4:00 PM) (Friday 8:00 AM to 12:00 Noon) or if you have a scheduled appointment with [place], prior to your surgery, and ask for it by name. In addition, you will need to provide [place] with your name, name of your surgeon, type of surgery, and date of procedure or surgery.  *Opioid medications include: morphine, codeine, oxycodone, oxymorphone, hydrocodone, hydromorphone, meperidine, tramadol, tapentadol, buprenorphine, fentanyl, methadone. **Benzodiazepine medications include: diazepam (Valium), alprazolam (Xanax), clonazepam (Klonopine), lorazepam (Ativan), clorazepate (Tranxene), chlordiazepoxide (Librium), estazolam (Prosom),  oxazepam (Serax), temazepam (Restoril), triazolam (Halcion) (Last updated: 02/14/2018) ____________________________________________________________________________________________   ____________________________________________________________________________________________  Preparing for Procedure with Sedation  Instructions: . Oral Intake: Do not eat or drink anything for at least 8 hours prior to your procedure. . Transportation: Public transportation is not allowed. Bring an adult driver. The driver must be physically present in our waiting room before any procedure can be started. Marland Kitchen Physical Assistance: Bring an adult physically capable of assisting you, in the event you need help. This adult should keep you company at home for at least 6 hours after the procedure. . Blood Pressure Medicine: Take your blood pressure medicine with a sip of water the morning of the procedure. . Blood thinners: Notify our staff if you are taking any blood thinners. Depending on which one you take, there will be specific instructions on how and when to stop it. . Diabetics on insulin: Notify the staff so that you can be scheduled 1st case in the morning. If your diabetes requires high dose insulin, take only  of your normal insulin dose the morning of the procedure and notify the staff that you have done so. . Preventing infections: Shower with an antibacterial soap the morning of your procedure. . Build-up your immune system: Take 1000 mg of Vitamin C with every meal (3 times a day) the day prior to your procedure. Marland Kitchen Antibiotics: Inform the staff if you have a condition or reason that requires you to take antibiotics before dental procedures. . Pregnancy: If you are pregnant, call and cancel the procedure. . Sickness: If you have a cold, fever, or any active infections, call and cancel the procedure. . Arrival: You must be in the facility at least 30 minutes prior to your scheduled procedure. . Children: Do  not bring children with you. Marland Kitchen  Dress appropriately: Bring dark clothing that you would not mind if they get stained. . Valuables: Do not bring any jewelry or valuables.  Procedure appointments are reserved for interventional treatments only. Marland Kitchen No Prescription Refills. . No medication changes will be discussed during procedure appointments. . No disability issues will be discussed.  Reasons to call and reschedule or cancel your procedure: (Following these recommendations will minimize the risk of a serious complication.) . Surgeries: Avoid having procedures within 2 weeks of any surgery. (Avoid for 2 weeks before or after any surgery). . Flu Shots: Avoid having procedures within 2 weeks of a flu shots or . (Avoid for 2 weeks before or after immunizations). . Barium: Avoid having a procedure within 7-10 days after having had a radiological study involving the use of radiological contrast. (Myelograms, Barium swallow or enema study). . Heart attacks: Avoid any elective procedures or surgeries for the initial 6 months after a "Myocardial Infarction" (Heart Attack). . Blood thinners: It is imperative that you stop these medications before procedures. Let us know if you if you take any blood thinner.  . Infection: Avoid procedures during or within two weeks of an infection (including chest colds or gastrointestinal problems). Symptoms associated with infections include: Localized redness, fever, chills, night sweats or profuse sweating, burning sensation when voiding, cough, congestion, stuffiness, runny nose, sore throat, diarrhea, nausea, vomiting, cold or Flu symptoms, recent or current infections. It is specially important if the infection is over the area that we intend to treat. Marland Kitchen Heart and lung problems: Symptoms that may suggest an active cardiopulmonary problem include: cough, chest pain, breathing difficulties or shortness of breath, dizziness, ankle swelling, uncontrolled high or unusually low  blood pressure, and/or palpitations. If you are experiencing any of these symptoms, cancel your procedure and contact your primary care physician for an evaluation.  Remember:  Regular Business hours are:  Monday to Thursday 8:00 AM to 4:00 PM  Provider's Schedule: Delano Metz, MD:  Procedure days: Tuesday and Thursday 7:30 AM to 4:00 PM  Edward Jolly, MD:  Procedure days: Monday and Wednesday 7:30 AM to 4:00 PM ____________________________________________________________________________________________  Radiofrequency Lesioning Radiofrequency lesioning is a procedure that is performed to relieve pain. The procedure is often used for back, neck, or arm pain. Radiofrequency lesioning involves the use of a machine that creates radio waves to make heat. During the procedure, the heat is applied to the nerve that carries the pain signal. The heat damages the nerve and interferes with the pain signal. Pain relief usually starts about 2 weeks after the procedure and lasts for 6 months to 1 year. Tell a health care provider about:  Any allergies you have.  All medicines you are taking, including vitamins, herbs, eye drops, creams, and over-the-counter medicines.  Any problems you or family members have had with anesthetic medicines.  Any blood disorders you have.  Any surgeries you have had.  Any medical conditions you have.  Whether you are pregnant or may be pregnant. What are the risks? Generally, this is a safe procedure. However, problems may occur, including:  Pain or soreness at the injection site.  Infection at the injection site.  Damage to nerves or blood vessels. What happens before the procedure?  Ask your health care provider about: ? Changing or stopping your regular medicines. This is especially important if you are taking diabetes medicines or blood thinners. ? Taking medicines such as aspirin and ibuprofen. These medicines can thin your blood. Do not take  these  medicines before your procedure if your health care provider instructs you not to.  Follow instructions from your health care provider about eating or drinking restrictions.  Plan to have someone take you home after the procedure.  If you go home right after the procedure, plan to have someone with you for 24 hours. What happens during the procedure?  You will be given one or more of the following: ? A medicine to help you relax (sedative). ? A medicine to numb the area (local anesthetic).  You will be awake during the procedure. You will need to be able to talk with the health care provider during the procedure.  With the help of a type of X-ray (fluoroscopy), the health care provider will insert a radiofrequency needle into the area to be treated.  Next, a wire that carries the radio waves (electrode) will be put through the radiofrequency needle. An electrical pulse will be sent through the electrode to verify the correct nerve. You will feel a tingling sensation, and you may have muscle twitching.  Then, the tissue that is around the needle tip will be heated by an electric current that is passed using the radiofrequency machine. This will numb the nerves.  A bandage (dressing) will be put on the insertion area after the procedure is done. The procedure may vary among health care providers and hospitals. What happens after the procedure?  Your blood pressure, heart rate, breathing rate, and blood oxygen level will be monitored often until the medicines you were given have worn off.  Return to your normal activities as directed by your health care provider. This information is not intended to replace advice given to you by your health care provider. Make sure you discuss any questions you have with your health care provider. Document Released: 08/02/2011 Document Revised: 05/11/2016 Document Reviewed: 01/11/2015 [employer]vier Interactive Patient Education  2019 [employer]

## 2018-12-20 NOTE — Consult Note (Signed)
Pharmacy Antibiotic Note  Jean Gomez is a 63 y.o. female admitted on 12/18/2018 with pneumonia.  Pharmacy has been consulted for Levaquin dosing.  Plan: Continue Levaquin 750mg  every 24 hours. Recommend a duration of 5-7 days.   Height: 5\' 4"  (162.6 cm) Weight: 159 lb (72.1 kg) IBW/kg (Calculated) : 54.7  Temp (24hrs), Avg:98.4 F (36.9 C), Min:97.9 F (36.6 C), Max:98.8 F (37.1 C)  Recent Labs  Lab 12/18/18 0743 12/18/18 1220 12/19/18 0406  WBC 13.4*  --  7.4  CREATININE 0.61  --  0.45  LATICACIDVEN 3.2* 1.6  --     Estimated Creatinine Clearance: 71 mL/min (by C-G formula based on SCr of 0.45 mg/dL).    Allergies  Allergen Reactions  . Contrast Media [Iodinated Diagnostic Agents] Swelling  . Gabapentin   . Labetalol Other (See Comments)    Made hair fall out  . Sulfabenzamide Nausea Only  . Fentanyl Rash  . Penicillins Rash    Antimicrobials this admission: Levaquin 1/1 >>  ceftriaxone 1/1 Azithromycin 1/1  Microbiology results: 1/1 BCx: NGTD Influenza A,B (-)  Thank you for allowing pharmacy to be a part of this patient's care.  Ronnald Ramp, PharmD, BCPS 12/20/2018 10:25 AM

## 2018-12-23 ENCOUNTER — Encounter: Payer: Self-pay | Admitting: Nurse Practitioner

## 2018-12-23 LAB — CULTURE, BLOOD (ROUTINE X 2)
CULTURE: NO GROWTH
Culture: NO GROWTH
Special Requests: ADEQUATE

## 2018-12-29 ENCOUNTER — Other Ambulatory Visit: Payer: Self-pay

## 2018-12-29 ENCOUNTER — Emergency Department: Payer: Medicare Other

## 2018-12-29 DIAGNOSIS — L03119 Cellulitis of unspecified part of limb: Secondary | ICD-10-CM | POA: Insufficient documentation

## 2018-12-29 DIAGNOSIS — R609 Edema, unspecified: Secondary | ICD-10-CM | POA: Insufficient documentation

## 2018-12-29 DIAGNOSIS — Z79899 Other long term (current) drug therapy: Secondary | ICD-10-CM | POA: Diagnosis not present

## 2018-12-29 DIAGNOSIS — I10 Essential (primary) hypertension: Secondary | ICD-10-CM | POA: Insufficient documentation

## 2018-12-29 DIAGNOSIS — R2243 Localized swelling, mass and lump, lower limb, bilateral: Secondary | ICD-10-CM | POA: Diagnosis present

## 2018-12-29 DIAGNOSIS — J9611 Chronic respiratory failure with hypoxia: Secondary | ICD-10-CM | POA: Insufficient documentation

## 2018-12-29 DIAGNOSIS — Z7982 Long term (current) use of aspirin: Secondary | ICD-10-CM | POA: Diagnosis not present

## 2018-12-29 DIAGNOSIS — R202 Paresthesia of skin: Secondary | ICD-10-CM | POA: Insufficient documentation

## 2018-12-29 DIAGNOSIS — J449 Chronic obstructive pulmonary disease, unspecified: Secondary | ICD-10-CM | POA: Diagnosis not present

## 2018-12-29 LAB — COMPREHENSIVE METABOLIC PANEL
ALT: 21 U/L (ref 0–44)
AST: 19 U/L (ref 15–41)
Albumin: 4.1 g/dL (ref 3.5–5.0)
Alkaline Phosphatase: 55 U/L (ref 38–126)
Anion gap: 6 (ref 5–15)
BUN: 14 mg/dL (ref 8–23)
CO2: 30 mmol/L (ref 22–32)
Calcium: 8.6 mg/dL — ABNORMAL LOW (ref 8.9–10.3)
Chloride: 104 mmol/L (ref 98–111)
Creatinine, Ser: 0.98 mg/dL (ref 0.44–1.00)
GFR calc non Af Amer: >60 mL/min (ref >60)
Glucose, Bld: 127 mg/dL — ABNORMAL HIGH (ref 70–99)
Potassium: 3.5 mmol/L (ref 3.5–5.1)
Sodium: 140 mmol/L (ref 135–145)
Total Bilirubin: 0.5 mg/dL (ref 0.3–1.2)
Total Protein: 6.5 g/dL (ref 6.5–8.1)

## 2018-12-29 LAB — CBC WITH DIFFERENTIAL/PLATELET
Abs Immature Granulocytes: 0.03 10*3/uL (ref 0.00–0.07)
Basophils Absolute: 0 10*3/uL (ref 0.0–0.1)
Basophils Relative: 0 %
Eosinophils Absolute: 0.1 10*3/uL (ref 0.0–0.5)
Eosinophils Relative: 1 %
HCT: 34.2 % — ABNORMAL LOW (ref 36.0–46.0)
Hemoglobin: 10.6 g/dL — ABNORMAL LOW (ref 12.0–15.0)
Immature Granulocytes: 1 %
Lymphocytes Relative: 49 %
Lymphs Abs: 3 10*3/uL (ref 0.7–4.0)
MCH: 27.2 pg (ref 26.0–34.0)
MCHC: 31 g/dL (ref 30.0–36.0)
MCV: 87.7 fL (ref 80.0–100.0)
Monocytes Absolute: 0.7 10*3/uL (ref 0.1–1.0)
Monocytes Relative: 12 %
Neutro Abs: 2.2 10*3/uL (ref 1.7–7.7)
Neutrophils Relative %: 37 %
Platelets: 474 10*3/uL — ABNORMAL HIGH (ref 150–400)
RBC: 3.9 MIL/uL (ref 3.87–5.11)
RDW: 13.8 % (ref 11.5–15.5)
WBC: 6 10*3/uL (ref 4.0–10.5)
nRBC: 0 % (ref 0.0–0.2)

## 2018-12-29 NOTE — ED Triage Notes (Signed)
Patient reports swelling in legs since discharge from hospital.  States noticed numbness on right side of face and right arm since early Saturday.

## 2018-12-30 ENCOUNTER — Emergency Department
Admission: EM | Admit: 2018-12-30 | Discharge: 2018-12-30 | Disposition: A | Discharge status: Home / Self Care | Payer: Medicare Other | Attending: Emergency Medicine | Admitting: Emergency Medicine

## 2018-12-30 DIAGNOSIS — R609 Edema, unspecified: Secondary | ICD-10-CM

## 2018-12-30 DIAGNOSIS — L03119 Cellulitis of unspecified part of limb: Secondary | ICD-10-CM

## 2018-12-30 LAB — BRAIN NATRIURETIC PEPTIDE: B Natriuretic Peptide: 16 pg/mL (ref 0.0–100.0)

## 2018-12-30 MED ORDER — TORSEMIDE 20 MG PO TABS: 20.0000 mg | ORAL_TABLET | Freq: Every day | ORAL | 0 refills | Status: DC

## 2018-12-30 MED ORDER — MORPHINE SULFATE (PF) 4 MG/ML IV SOLN
4.0000 mg | Freq: Once | INTRAVENOUS | Status: AC
  Administered 2018-12-30: 4 mg via INTRAMUSCULAR
  Filled 2018-12-30: qty 1

## 2018-12-30 MED ORDER — MEDICAL COMPRESSION STOCKINGS MISC: 0 refills | Status: AC

## 2018-12-30 MED ORDER — CLINDAMYCIN HCL 300 MG PO CAPS: 300.0000 mg | ORAL_CAPSULE | Freq: Three times a day (TID) | ORAL | 0 refills | Status: AC

## 2018-12-30 NOTE — ED Provider Notes (Signed)
[facility] Emergency Department Provider Note  Time seen: 7:16 AM  I have reviewed the triage vital signs and the nursing notes.   HISTORY  Chief Complaint Leg Swelling and Numbness    HPI Jean Gomez is a 63 y.o. female with a past medical history of asthma, COPD, hypertension, bipolar, presents to the emergency department for lower extremity swelling and discomfort.  According to the patient for the past 1 to 2 weeks she has had progressive swelling of her bilateral lower extremities.  Patient states she has swelling of her lower extremities at baseline, takes torsemide, has been taking this medication but states her legs continued to swell, she was concerned so she came to the emergency department.  Denies any shortness of breath cough or fever.  Was discharged from the hospital 12/18/2018 after an admission for pneumonia.  Denies any chest pain.   Past Medical History:  Diagnosis Date  . Allergy   . Asthma   . Cerebral aneurysm   . Chest pain 11/01/2016  . Chronic migraine   . Chronic respiratory failure (HCC)   . COPD (chronic obstructive pulmonary disease) (HCC)   . Hypertension   . Osteoarthritis   . Pneumonia    12/2018  . Sarcoid   . Seizures [facility](HCC)     Patient Active Problem List   Diagnosis Date Noted  . Acute on chronic respiratory failure with hypoxia (HCC) 12/18/2018  . Cervicogenic headache 10/21/2018  . Lumbar spondylosis 06/25/2018  . History of allergy to radiographic contrast media 05/30/2018  . Bipolar affective disorder, current episode manic (HCC) 04/17/2018  . Chronic hip pain [facility](Tertiary Area of Pain) (Bilateral) (R>L) 02/26/2018  . DDD (degenerative disc disease), cervical 02/26/2018  . Cervical facet arthropathy (Bilateral) 02/26/2018  . Cervical facet syndrome (Bilateral) (R>L) 02/26/2018  . Cervical Grade 1 Anterolisthesis of C4 over C5 (Degenerative) 02/26/2018  . Cervical Grade 1 Retrolisthesis of C5 over C6  02/26/2018  . Cervical foraminal stenosis (C3-4, C4-5, and C5-6) (Bilateral) 02/26/2018  . DDD (degenerative disc disease), lumbar 02/26/2018  . Osteoarthritis of lumbar spine 02/26/2018  . Osteoarthritis of facet joint of lumbar spine 02/26/2018  . Spondylosis without myelopathy or radiculopathy, cervicothoracic region 02/26/2018  . Occipital headache (Right) 02/25/2018  . Frontal headache (Left) 02/25/2018  . Chronic low back pain (Midline) (Secondary Area of Pain) 02/25/2018  . Chronic lumbar radiculopathy (Right) 02/25/2018  . Osteoarthritis involving multiple joints 02/25/2018  . Osteoarthritis of shoulder (Bilateral) 02/25/2018  . Chronic musculoskeletal pain 02/25/2018  . Lumbar facet joint syndrome (Bilateral) (R>L) 02/25/2018  . Vitamin D deficiency 02/14/2018  . Chronic pain syndrome 02/07/2018  . Pharmacologic therapy 02/07/2018  . Disorder of skeletal system 02/07/2018  . Problems influencing health status 02/07/2018  . Long term current use of opiate analgesic 02/07/2018  . Chronic low back pain (Secondary Area of Pain) (Bilateral) (R>L) w/ sciatica (Right) 02/07/2018  . Chronic lower extremity pain [facility](Tertiary Area of Pain) (Bilateral) (R>L) 02/07/2018  . Chronic knee pain (Fourth Area of Pain) (Bilateral) (R>L) 02/07/2018  . Chronic shoulder pain (Fifth Area of Pain) (Bilateral) (R>L) 02/07/2018  . Occipital neuralgia (Primary Area of Pain) (Bilateral) (R>L) 02/07/2018  . Chronic neck pain (Bilateral) (R>L) 02/07/2018  . Chronic sacroiliac joint pain (Right) 02/07/2018  . H/O aneurysm 01/30/2018  . History of depression 01/30/2018  . History of seizure disorder 01/30/2018  . Chronic headache disorder (Primary Area of Pain) 01/30/2018  . Chronic migraine without aura 01/17/2018  . Chronic  respiratory failure with hypoxia (HCC) 01/17/2018  . Constipation 01/17/2018  . HTN, goal below 140/90 01/17/2018  . Hypercholesterolemia 01/17/2018  . Major depression in remission  (HCC) 01/17/2018  . COPD (chronic obstructive pulmonary disease) (HCC) 01/17/2018  . Sarcoidosis 01/17/2018  . Seizure disorder (HCC) 01/17/2018  . Nonruptured cerebral aneurysm 10/22/2017  . Obstructive sleep apnea on CPAP 09/11/2017  . Generalized anxiety disorder 01/26/2017  . Irritable bowel syndrome 01/26/2017  . Pulmonary emphysema (HCC) 01/26/2017  . GERD (gastroesophageal reflux disease) 08/02/2016  . Hiatal hernia 08/02/2016  . Essential tremor 07/19/2016  . Muscle spasm 10/05/2015  . Spondylosis without myelopathy or radiculopathy, lumbosacral region 10/05/2015  . Rotator cuff injury 09/17/2015  . Chronic shoulder pain (Right) 09/17/2015  . Epilepsy (HCC) 07/14/2015  . Insomnia 07/14/2015  . Osteoarthritis of knee (Bilateral) (R>L) 06/24/2015    Past Surgical History:  Procedure Laterality Date  . ABDOMINAL HYSTERECTOMY    . CHOLECYSTECTOMY    . SPINAL FUSION      Prior to Admission medications   Medication Sig Start Date End Date Taking? Authorizing Provider  Albuterol Sulfate 108 (90 Base) MCG/ACT AEPB Inhale 2 puffs into the lungs every 6 (six) hours as needed.    [provider]  amLODipine (NORVASC) 10 MG tablet Take 1 tablet (10 mg total) by mouth daily. 04/19/18   Clapacs, Jackquline DenmarkJohn T, MD  aspirin EC 81 MG tablet Take 1 tablet (81 mg total) by mouth daily. 04/19/18   Clapacs, Jackquline DenmarkJohn T, MD  atorvastatin (LIPITOR) 20 MG tablet Take 20 mg by mouth daily.    [provider]  betamethasone, augmented, (DIPROLENE) 0.05 % lotion Apply topically 2 (two) times daily.    [provider]  citalopram (CELEXA) 20 MG tablet Take 20 mg by mouth daily.    [provider]  clotrimazole-betamethasone (LOTRISONE) cream Apply 1 application topically 2 (two) times daily. 04/19/18   Clapacs, Jackquline DenmarkJohn T, MD  diclofenac sodium (VOLTAREN) 1 % GEL Apply 4 g topically 4 (four) times daily.    [provider]  diphenhydrAMINE (BENADRYL) 25 MG tablet Take 25 mg  by mouth every 6 (six) hours as needed.    [provider]  hydrochlorothiazide (HYDRODIURIL) 25 MG tablet Take 1 tablet (25 mg total) by mouth daily. 04/19/18   Clapacs, Jackquline DenmarkJohn T, MD  HYDROcodone-acetaminophen (NORCO) 10-325 MG tablet Take 1 tablet by mouth every 6 (six) hours as needed for severe pain. 02/18/19 03/20/19  Barbette MerinoKing, Crystal M, NP  HYDROcodone-acetaminophen (NORCO) 10-325 MG tablet Take 1 tablet by mouth every 6 (six) hours as needed for severe pain. 01/19/19 02/18/19  Barbette MerinoKing, Crystal M, NP  HYDROcodone-acetaminophen (NORCO) 10-325 MG tablet Take 1 tablet by mouth every 6 (six) hours as needed for severe pain. 12/20/18 01/19/19  Barbette MerinoKing, Crystal M, NP  ipratropium (ATROVENT) 0.02 % nebulizer solution Take 0.5 mg by nebulization every 4 (four) hours as needed for wheezing or shortness of breath.    [provider]  levETIRAcetam (KEPPRA) 500 MG tablet Take 500 mg by mouth 2 (two) times daily. 11/20/18   [provider]  lidocaine (XYLOCAINE) 5 % ointment Apply 1 application topically 4 (four) times daily as needed for moderate pain. 12/20/18 03/20/19  Houston SirenSainani, Vivek J, MD  linaclotide (LINZESS) 145 MCG CAPS capsule Take 145 mcg by mouth daily before breakfast.    [provider]  losartan (COZAAR) 25 MG tablet Take 1 tablet (25 mg total) by mouth daily. 04/19/18   Clapacs, Jackquline DenmarkJohn T, MD  nitroGLYCERIN (NITROSTAT) 0.4 MG SL tablet Place 0.4 mg under the tongue every 5 (five) minutes as needed for chest pain.    [provider]  ondansetron (ZOFRAN) 4 MG tablet Take 4 mg by mouth every 8 (eight) hours as needed for nausea or vomiting.    [provider]  pantoprazole (PROTONIX) 40 MG tablet Take 1 tablet (40 mg total) by mouth daily. 04/19/18   Clapacs, Jackquline Denmark, MD  potassium chloride SA (K-DUR,KLOR-CON) 20 MEQ tablet Take 1 tablet (20 mEq total) by mouth 2 (two) times daily. 04/19/18   Clapacs, Jackquline Denmark, MD  predniSONE (DELTASONE) 10 MG tablet Label  & dispense according to  the schedule below. 5 Pills PO for 1 day then, 4 Pills PO for 1 day, 3 Pills PO for 1 day, 2 Pills PO for 1 day, 1 Pill PO for 1 days then STOP. 12/20/18   Sainani, Rolly Pancake, MD  QUEtiapine (SEROQUEL) 300 MG tablet Take 300 mg by mouth at bedtime.    [provider]  sucralfate (CARAFATE) 1 g tablet Take 1 tablet (1 g total) by mouth 4 (four) times daily. 04/19/18   Clapacs, Jackquline Denmark, MD  tiZANidine (ZANAFLEX) 4 MG tablet Take 1 tablet (4 mg total) by mouth 3 (three) times daily. 12/20/18 02/18/19  Barbette Merino, NP  torsemide [facility]) 5 MG tablet  03/14/18   [provider]  venlafaxine (EFFEXOR) 75 MG tablet TK 1 T PO BID 09/06/18   [provider]    Allergies  Allergen Reactions  . Contrast Media [Iodinated Diagnostic Agents] Swelling  . Gabapentin   . Labetalol Other (See Comments)    Made hair fall out  . Sulfabenzamide Nausea Only  . Fentanyl Rash  . Penicillins Rash    Family History  Problem Relation Age of Onset  . Dementia Mother   . Breast cancer Mother   . Throat cancer Father     Social History Social History   Tobacco Use  . Smoking status: Never Smoker  . Smokeless tobacco: Never Used  Substance Use Topics  . Alcohol use: Yes    Comment: occasional  . Drug use: No    Review of Systems Constitutional: Negative for fever. Cardiovascular: Negative for chest pain. Respiratory: Negative for shortness of breath. Gastrointestinal: Negative for abdominal pain Musculoskeletal: Lower extremity swelling, tingling and discomfort at times. Skin: Mild abrasions to right lower extremity. Neurological: Negative for headache All other ROS negative  ____________________________________________   PHYSICAL EXAM:  VITAL SIGNS: ED Triage Vitals [12/29/18 2311]  Enc Vitals Group     BP 118/85     Pulse Rate (!) 111     Resp 20     Temp 98.9 F (37.2 C)     Temp Source Oral     SpO2 99 %     Weight      Height      Head Circumference       Peak Flow      Pain Score 10     Pain Loc      Pain Edu?      Excl. in GC?    Constitutional: Alert and oriented. Well appearing and in no distress. Eyes: Normal exam ENT   Head: Normocephalic and atraumatic   Mouth/Throat: Mucous membranes are moist. Cardiovascular: Normal rate, regular rhythm.  Respiratory: Normal respiratory effort without tachypnea nor retractions. Breath sounds are clear Gastrointestinal: Soft and nontender. No distention. Musculoskeletal: 2+ lower extremity edema, equal bilaterally.  Patient does have small little abrasions to her right lower extremity with minimal surrounding erythema. Neurologic:  Normal speech and language. No gross focal neurologic deficits Skin:  Skin is warm Psychiatric: Mood and affect are normal.   ____________________________________________    EKG  EKG viewed and interpreted by myself shows sinus tachycardia 107 bpm, narrow QRS, normal axis, normal intervals nonspecific ST changes.  No ST elevation.  ____________________________________________    RADIOLOGY  Chest x-ray shows interval resolution of pneumonia  ____________________________________________   INITIAL IMPRESSION / ASSESSMENT AND PLAN / ED COURSE  Pertinent labs & imaging results that were available during my care of the patient were reviewed by me and considered in my medical decision making (see chart for details).  Patient presents to the emergency department for greater than 1 week of progressive lower extremity swelling.  Now states discomfort when standing on the legs due to swelling, has several small abrasions to her right lower extremity with surrounding erythema as well.  Reassuringly patient's labs are largely within normal limits/baseline for the patient.  Chest x-ray shows interval resolution of pneumonia.  Nonspecific findings on EKG.  Overall patient's clinical presentation is most consistent with peripheral edema.  We will increase the  patient's home torsemide dose over the next 5 days, we will cover with a small course of antibiotics given the possibility of minimal cellulitis to the right lower extremity.  Edema is equal bilaterally, no calf tenderness, history of peripheral edema.  No concern for DVT at this time.  ____________________________________________   FINAL CLINICAL IMPRESSION(S) / ED DIAGNOSES  Peripheral edema Cellulitis   Minna Antis, MD 12/30/18 [phone]

## 2018-12-30 NOTE — Discharge Instructions (Signed)
As we discussed please take your antibiotics for the next 5 days.  Please take your increased dose of torsemide for the next 5 days.  Please fill and begin using your compression stockings.  Keep your legs elevated at night when sleeping.  Follow-up with your doctor in the next 2 to 3 days for recheck/reevaluation.  Return to the emergency department for any worsening swelling, any chest pain or trouble breathing, or any other symptom personally concerning to yourself.

## 2019-01-09 ENCOUNTER — Ambulatory Visit: Payer: Self-pay | Admitting: Pain Medicine

## 2019-01-16 ENCOUNTER — Ambulatory Visit
Admission: RE | Admit: 2019-01-16 | Discharge: 2019-01-16 | Disposition: A | Discharge status: Home / Self Care | Payer: Medicare Other | Source: Ambulatory Visit | Attending: Pain Medicine | Admitting: Pain Medicine

## 2019-01-16 ENCOUNTER — Ambulatory Visit (HOSPITAL_BASED_OUTPATIENT_CLINIC_OR_DEPARTMENT_OTHER): Payer: Medicare Other | Admitting: Pain Medicine

## 2019-01-16 ENCOUNTER — Encounter: Payer: Self-pay | Admitting: Pain Medicine

## 2019-01-16 ENCOUNTER — Other Ambulatory Visit: Payer: Self-pay

## 2019-01-16 VITALS — BP 156/106 | HR 71 | Temp 98.7°F | Resp 16 | Ht 63.0 in | Wt 150.0 lb

## 2019-01-16 DIAGNOSIS — G8929 Other chronic pain: Secondary | ICD-10-CM | POA: Diagnosis present

## 2019-01-16 DIAGNOSIS — M545 Low back pain, unspecified: Secondary | ICD-10-CM | POA: Insufficient documentation

## 2019-01-16 DIAGNOSIS — M5136 Other intervertebral disc degeneration, lumbar region: Secondary | ICD-10-CM | POA: Insufficient documentation

## 2019-01-16 DIAGNOSIS — G8918 Other acute postprocedural pain: Secondary | ICD-10-CM | POA: Insufficient documentation

## 2019-01-16 DIAGNOSIS — M47816 Spondylosis without myelopathy or radiculopathy, lumbar region: Secondary | ICD-10-CM

## 2019-01-16 DIAGNOSIS — M47817 Spondylosis without myelopathy or radiculopathy, lumbosacral region: Secondary | ICD-10-CM | POA: Insufficient documentation

## 2019-01-16 DIAGNOSIS — M51369 Other intervertebral disc degeneration, lumbar region without mention of lumbar back pain or lower extremity pain: Secondary | ICD-10-CM

## 2019-01-16 HISTORY — DX: Other acute postprocedural pain: G89.18

## 2019-01-16 MED ORDER — LIDOCAINE HCL 2 % IJ SOLN
20.0000 mL | Freq: Once | INTRAMUSCULAR | Status: AC
  Administered 2019-01-16: 400 mg
  Filled 2019-01-16: qty 40

## 2019-01-16 MED ORDER — OXYCODONE-ACETAMINOPHEN 5-325 MG PO TABS: 1.0000 | ORAL_TABLET | Freq: Three times a day (TID) | ORAL | 0 refills | Status: AC | PRN

## 2019-01-16 MED ORDER — LACTATED RINGERS IV SOLN
1000.0000 mL | Freq: Once | INTRAVENOUS | Status: AC
  Administered 2019-01-16: 1000 mL via INTRAVENOUS

## 2019-01-16 MED ORDER — ROPIVACAINE HCL 2 MG/ML IJ SOLN
18.0000 mL | Freq: Once | INTRAMUSCULAR | Status: AC
  Administered 2019-01-16: 9 mL via PERINEURAL
  Filled 2019-01-16: qty 20

## 2019-01-16 MED ORDER — TRIAMCINOLONE ACETONIDE 40 MG/ML IJ SUSP
80.0000 mg | Freq: Once | INTRAMUSCULAR | Status: AC
  Administered 2019-01-16: 40 mg
  Filled 2019-01-16: qty 2

## 2019-01-16 MED ORDER — MIDAZOLAM HCL 5 MG/5ML IJ SOLN
1.0000 mg | INTRAMUSCULAR | Status: DC | PRN
  Administered 2019-01-16: 2 mg via INTRAVENOUS
  Filled 2019-01-16: qty 5

## 2019-01-16 NOTE — Progress Notes (Signed)
Safety precautions to be maintained throughout the outpatient stay will include: orient to surroundings, keep bed in low position, maintain call bell within reach at all times, provide assistance with transfer out of bed and ambulation.  

## 2019-01-16 NOTE — Patient Instructions (Addendum)
___________________________________________________________________________________________  Post-Radiofrequency (RF) Discharge Instructions  You have just completed a Radiofrequency Neurotomy.  The following instructions will provide you with information and guidelines for self-care upon discharge.  If at any time you have questions or concerns please call your physician. DO NOT DRIVE YOURSELF!!  Instructions:  Apply ice: Fill a plastic sandwich bag with crushed ice. Cover it with a small towel and apply to injection site. Apply for 15 minutes then remove x 15 minutes. Repeat sequence on day of procedure, until you go to bed. The purpose is to minimize swelling and discomfort after procedure.  Apply heat: Apply heat to procedure site starting the day following the procedure. The purpose is to treat any soreness and discomfort from the procedure.  Food intake: No eating limitations, unless stipulated above.  Nevertheless, if you have had sedation, you may experience some nausea.  In this case, it may be wise to wait at least two hours prior to resuming regular diet.  Physical activities: Keep activities to a minimum for the first 8 hours after the procedure. For the first 24 hours after the procedure, do not drive a motor vehicle,  Operate heavy machinery, power tools, or handle any weapons.  Consider walking with the use of an assistive device or accompanied by an adult for the first 24 hours.  Do not drink alcoholic beverages including beer.  Do not make any important decisions or sign any legal documents. Go home and rest today.  Resume activities tomorrow, as tolerated.  Use caution in moving about as you may experience mild leg weakness.  Use caution in cooking, use of household electrical appliances and climbing steps.  Driving: If you have received any sedation, you are not allowed to drive for 24 hours after your procedure.  Blood thinner: Restart your blood thinner 6 hours after your  procedure. (Only for those taking blood thinners)  Insulin: As soon as you can eat, you may resume your normal dosing schedule. (Only for those taking insulin)  Medications: May resume pre-procedure medications.  Do not take any drugs, other than what has been prescribed to you.  Infection prevention: Keep procedure site clean and dry.  Post-procedure Pain Diary: Extremely important that this be done correctly and accurately. Recorded information will be used to determine the next step in treatment.  Pain evaluated is that of treated area only. Do not include pain from an untreated area.  Complete every hour, on the hour, for the initial 8 hours. Set an alarm to help you do this part accurately.  Do not go to sleep and have it completed later. It will not be accurate.  Follow-up appointment: Keep your follow-up appointment after the procedure. Usually 2 weeks for most procedures. (6 weeks in the case of radiofrequency.) Bring you pain diary.   Expect:  From numbing medicine (AKA: Local Anesthetics): Numbness or decrease in pain.  Onset: Full effect within 15 minutes of injected.  Duration: It will depend on the type of local anesthetic used. On the average, 1 to 8 hours.   From steroids: Decrease in swelling or inflammation. Once inflammation is improved, relief of the pain will follow.  Onset of benefits: Depends on the amount of swelling present. The more swelling, the longer it will take for the benefits to be seen. In some cases, up to 10 days.  Duration: Steroids will stay in the system x 2 weeks. Duration of benefits will depend on multiple posibilities including persistent irritating factors.  From procedure: Some   discomfort is to be expected once the numbing medicine wears off. This should be minimal if ice and heat are applied as instructed.  Call if:  You experience numbness and weakness that gets worse with time, as opposed to wearing off.  He experience any unusual  bleeding, difficulty breathing, or loss of the ability to control your bowel and bladder. (This applies to Spinal procedures only)  You experience any redness, swelling, heat, red streaks, elevated temperature, fever, or any other signs of a possible infection.  Emergency Numbers:  [facility] hours (Monday - Thursday, 8:00 AM - 4:00 PM) (Friday, 9:00 AM - 12:00 Noon): [phone]  After hours: [phone] ____________________________________________________________________________________________    ______________________________________________________________________________________________  Specialty Pain Scale  Introduction:  There are significant differences in how pain is reported. The word pain usually refers to physical pain, but it is also a common synonym of suffering. The medical community uses a scale from 0 (zero) to 10 (ten) to report pain level. Zero (0) is described as "no pain", while ten (10) is described as "the worse pain you can imagine". The problem with this scale is that physical pain is reported along with suffering. Suffering refers to mental pain, or more often yet it refers to any unpleasant feeling, emotion or aversion associated with the perception of harm or threat of harm. It is the psychological component of pain.  Pain Specialists prefer to separate the two components. The pain scale used by this practice is the Verbal Numerical Rating Scale (VNRS-11). This scale is for the physical pain only. DO NOT INCLUDE how your pain psychologically affects you. This scale is for adults 4 years of age and older. It has 11 (eleven) levels. The 1st level is 0/10. This means: "right now, I have no pain". In the context of pain management, it also means: "right now, my physical pain is under control with the current therapy".  General Information:  The scale should reflect your current level of pain. Unless you are specifically asked for the level of your worst  pain, or your average pain. If you are asked for one of these two, then it should be understood that it is over the past 24 hours.  Levels 1 (one) through 5 (five) are described below, and can be treated as an outpatient. Ambulatory pain management facilities such as ours are more than adequate to treat these levels. Levels 6 (six) through 10 (ten) are also described below, however, these must be treated as a hospitalized patient. While levels 6 (six) and 7 (seven) may be evaluated at an urgent care facility, levels 8 (eight) through 10 (ten) constitute medical emergencies and as such, they belong in a hospital's emergency department. When having these levels (as described below), do not come to our office. Our facility is not equipped to manage these levels. Go directly to an urgent care facility or an emergency department to be evaluated.  Definitions:  Activities of Daily Living (ADL): Activities of daily living (ADL or ADLs) is a term used in healthcare to refer to people's daily self-care activities. Health professionals often use a person's ability or inability to perform ADLs as a measurement of their functional status, particularly in regard to people post injury, with disabilities and the elderly. There are two ADL levels: Basic and Instrumental. Basic Activities of Daily Living (BADL  or BADLs) consist of self-care tasks that include: Bathing and showering; personal hygiene and grooming (including brushing/combing/styling hair); dressing; Toilet hygiene (getting to the toilet,  cleaning oneself, and getting back up); eating and self-feeding (not including cooking or chewing and swallowing); functional mobility, often referred to as "transferring", as measured by the ability to walk, get in and out of bed, and get into and out of a chair; the broader definition (moving from one place to another while performing activities) is useful for people with different physical abilities who are still able to get  around independently. Basic ADLs include the things many people do when they get up in the morning and get ready to go out of the house: get out of bed, go to the toilet, bathe, dress, groom, and eat. On the average, loss of function typically follows a particular order. Hygiene is the first to go, followed by loss of toilet use and locomotion. The last to go is the ability to eat. When there is only one remaining area in which the person is independent, there is a 62.9% chance that it is eating and only a 3.5% chance that it is hygiene. Instrumental Activities of Daily Living (IADL or IADLs) are not necessary for fundamental functioning, but they let an individual live independently in a community. IADL consist of tasks that include: cleaning and maintaining the house; home establishment and maintenance; care of others (including selecting and supervising caregivers); care of pets; child rearing; managing money; managing financials (investments, etc.); meal preparation and cleanup; shopping for groceries and necessities; moving within the community; safety procedures and emergency responses; health management and maintenance (taking prescribed medications); and using the telephone or other form of communication.  Instructions:  Most patients tend to report their pain as a combination of two factors, their physical pain and their psychosocial pain. This last one is also known as "suffering" and it is reflection of how physical pain affects you socially and psychologically. From now on, report them separately.  From this point on, when asked to report your pain level, report only your physical pain. Use the following table for reference.  Pain Clinic Pain Levels (0-5/10)  Pain Level Score  Description  No Pain 0   Mild pain 1 Nagging, annoying, but does not interfere with basic activities of daily living (ADL). Patients are able to eat, bathe, get dressed, toileting (being able to get on and off the toilet  and perform personal hygiene functions), transfer (move in and out of bed or a chair without assistance), and maintain continence (able to control bladder and bowel functions). Blood pressure and heart rate are unaffected. A normal heart rate for a healthy adult ranges from 60 to 100 bpm (beats per minute).   Mild to moderate pain 2 Noticeable and distracting. Impossible to hide from other people. More frequent flare-ups. Still possible to adapt and function close to normal. It can be very annoying and may have occasional stronger flare-ups. With discipline, patients may get used to it and adapt.   Moderate pain 3 Interferes significantly with activities of daily living (ADL). It becomes difficult to feed, bathe, get dressed, get on and off the toilet or to perform personal hygiene functions. Difficult to get in and out of bed or a chair without assistance. Very distracting. With effort, it can be ignored when deeply involved in activities.   Moderately severe pain 4 Impossible to ignore for more than a few minutes. With effort, patients may still be able to manage work or participate in some social activities. Very difficult to concentrate. Signs of autonomic nervous system discharge are evident: dilated pupils (mydriasis); mild sweating (  diaphoresis); sleep interference. Heart rate becomes elevated (>115 bpm). Diastolic blood pressure (lower number) rises above 100 mmHg. Patients find relief in laying down and not moving.   Severe pain 5 Intense and extremely unpleasant. Associated with frowning face and frequent crying. Pain overwhelms the senses.  Ability to do any activity or maintain social relationships becomes significantly limited. Conversation becomes difficult. Pacing back and forth is common, as getting into a comfortable position is nearly impossible. Pain wakes you up from deep sleep. Physical signs will be obvious: pupillary dilation; increased sweating; goosebumps; brisk reflexes; cold,  clammy hands and feet; nausea, vomiting or dry heaves; loss of appetite; significant sleep disturbance with inability to fall asleep or to remain asleep. When persistent, significant weight loss is observed due to the complete loss of appetite and sleep deprivation.  Blood pressure and heart rate becomes significantly elevated. Caution: If elevated blood pressure triggers a pounding headache associated with blurred vision, then the patient should immediately seek attention at an urgent or emergency care unit, as these may be signs of an impending stroke.    Emergency Department Pain Levels (6-10/10)  Emergency Room Pain 6 Severely limiting. Requires emergency care and should not be seen or managed at an outpatient pain management facility. Communication becomes difficult and requires great effort. Assistance to reach the emergency department may be required. Facial flushing and profuse sweating along with potentially dangerous increases in heart rate and blood pressure will be evident.   Distressing pain 7 Self-care is very difficult. Assistance is required to transport, or use restroom. Assistance to reach the emergency department will be required. Tasks requiring coordination, such as bathing and getting dressed become very difficult.   Disabling pain 8 Self-care is no longer possible. At this level, pain is disabling. The individual is unable to do even the most "basic" activities such as walking, eating, bathing, dressing, transferring to a bed, or toileting. Fine motor skills are lost. It is difficult to think clearly.   Incapacitating pain 9 Pain becomes incapacitating. Thought processing is no longer possible. Difficult to remember your own name. Control of movement and coordination are lost.   The worst pain imaginable 10 At this level, most patients pass out from pain. When this level is reached, collapse of the autonomic nervous system occurs, leading to a sudden drop in blood pressure and  heart rate. This in turn results in a temporary and dramatic drop in blood flow to the brain, leading to a loss of consciousness. Fainting is one of the body's self defense mechanisms. Passing out puts the brain in a calmed state and causes it to shut down for a while, in order to begin the healing process.    Summary: 1. Refer to this scale when providing [place] with your pain level. 2. Be accurate and careful when reporting your pain level. This will help with your care. 3. Over-reporting your pain level will lead to loss of credibility. 4. Even a level of 1/10 means that there is pain and will be treated at our facility. 5. High, inaccurate reporting will be documented as "Symptom Exaggeration", leading to loss of credibility and suspicions of possible secondary gains such as obtaining more narcotics, or wanting to appear disabled, for fraudulent reasons. 6. Only pain levels of 5 or below will be seen at our facility. 7. Pain levels of 6 and above will be sent to the Emergency Department and the appointment cancelled. ______________________________________________________________________________________________    oxycocodone - apap 5-325 mg x 2  weeks for post procedure pain.  Fill dates 01/16/19, 01/23/19 escribed to your pharmacy

## 2019-01-16 NOTE — Progress Notes (Signed)
Patient's Name: Jean Gomez  MRN: [id number]  Referring Provider: Sherron Mondayejan-[name], S Ahmed, MD  DOB: March 06, 1956  PCP: Sherron Mondayejan-[name], S Ahmed, MD  DOS: 01/16/2019  Note by: Oswaldo DoneFrancisco A Dawsen Krieger, MD  Service setting: Ambulatory outpatient  Specialty: Interventional Pain Management  Patient type: Established  Location: [facility] (AMB) Pain Management Facility  Visit type: Interventional Procedure   Primary Reason for Visit: Interventional Pain Management Treatment. CC: Back Pain (bilateral, right is worse) and Knee Pain (right)  Procedure:          Anesthesia, Analgesia, Anxiolysis:  Type: Thermal Lumbar Facet, Medial Branch Radiofrequency Ablation/Neurotomy  #1  Primary Purpose: Therapeutic Region: Posterolateral Lumbosacral Spine Level: L2, L3, L4, L5, & S1 Medial Branch Level(s). These levels will denervate the L3-4, L4-5, and the L5-S1 lumbar facet joints. Laterality: Right  Type: Moderate (Conscious) Sedation combined with Local Anesthesia Indication(s): Analgesia and Anxiety Route: Intravenous (IV) IV Access: Secured Sedation: Meaningful verbal contact was maintained at all times during the procedure  Local Anesthetic: Lidocaine 1-2%  Position: Prone   Indications: 1. Spondylosis without myelopathy or radiculopathy, lumbosacral region   2. Lumbar facet syndrome (Bilateral) (R>L)   3. Osteoarthritis of facet joint of lumbar spine   4. DDD (degenerative disc disease), lumbar   5. Lumbar spondylosis   6. 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 has been dealing with the above chronic pain for longer than three months and has either failed to respond, was unable to tolerate, or simply did not get enough benefit from other more conservative therapies including, but not limited to: 1. Over-the-counter medications 2. Anti-inflammatory medications 3. Muscle relaxants 4. Membrane stabilizers 5. Opioids 6. Physical therapy and/or chiropractic  manipulation 7. Modalities (Heat, ice, etc.) 8. Invasive techniques such as nerve blocks. [name] has attained more than 50% relief of the pain from a series of diagnostic injections conducted in separate occasions.  Pain Score: Pre-procedure: 9 /10 Post-procedure: 0-No pain/10  Pre-op Assessment:  [name] is a 63 y.o. (year old), female patient, seen today for interventional treatment. She  has a past surgical history that includes Cholecystectomy; Abdominal hysterectomy; and Spinal fusion. [name] has a current medication list which includes the following prescription(s): albuterol sulfate, amlodipine, aspirin ec, atorvastatin, betamethasone (augmented), citalopram, clotrimazole-betamethasone, diclofenac sodium, diphenhydramine, medical compression stockings, hydrochlorothiazide, hydrocodone-acetaminophen, hydrocodone-acetaminophen, hydrocodone-acetaminophen, ipratropium, levetiracetam, lidocaine, linaclotide, losartan, nitroglycerin, ondansetron, pantoprazole, potassium chloride sa, quetiapine, sucralfate, tizanidine, torsemide, venlafaxine, alprazolam, oxycodone-acetaminophen, oxycodone-acetaminophen, and prednisone, and the following Facility-Administered Medications: midazolam. Her primarily concern today is the Back Pain (bilateral, right is worse) and Knee Pain (right)  Initial Vital Signs:  Pulse/HCG Rate: 92ECG Heart Rate: 82 Temp: 98.4 F (36.9 C) Resp: 18 BP: (!) 143/101 SpO2: 99 %  BMI: Estimated body mass index is 26.57 kg/m as calculated from the following:   Height as of this encounter: 5\' 3"  (1.6 m).   Weight as of this encounter: 150 lb (68 kg).  Risk Assessment: Allergies: Reviewed. She is allergic to contrast media [iodinated diagnostic agents]; gabapentin; labetalol; sulfabenzamide; fentanyl; and penicillins.  Allergy Precautions: None required Coagulopathies: Reviewed. None identified.  Blood-thinner therapy: None at this time Active Infection(s): Reviewed.  None identified. [name] is afebrile  Site Confirmation: [name] was asked to confirm the procedure and laterality before marking the site Procedure checklist: Completed Consent: Before the procedure and under the influence of no sedative(s), amnesic(s), or anxiolytics, the patient was informed of the treatment options,  risks and possible complications. To fulfill our ethical and legal obligations, as recommended by the American Medical Association's Code of Ethics, I have informed the patient of my clinical impression; the nature and purpose of the treatment or procedure; the risks, benefits, and possible complications of the intervention; the alternatives, including doing nothing; the risk(s) and benefit(s) of the alternative treatment(s) or procedure(s); and the risk(s) and benefit(s) of doing nothing. The patient was provided information about the general risks and possible complications associated with the procedure. These may include, but are not limited to: failure to achieve desired goals, infection, bleeding, organ or nerve damage, allergic reactions, paralysis, and death. In addition, the patient was informed of those risks and complications associated to Spine-related procedures, such as failure to decrease pain; infection (i.e.: Meningitis, epidural or intraspinal abscess); bleeding (i.e.: epidural hematoma, subarachnoid hemorrhage, or any other type of intraspinal or peri-dural bleeding); organ or nerve damage (i.e.: Any type of peripheral nerve, nerve root, or spinal cord injury) with subsequent damage to sensory, motor, and/or autonomic systems, resulting in permanent pain, numbness, and/or weakness of one or several areas of the body; allergic reactions; (i.e.: anaphylactic reaction); and/or death. Furthermore, the patient was informed of those risks and complications associated with the medications. These include, but are not limited to: allergic reactions (i.e.: anaphylactic or  anaphylactoid reaction(s)); adrenal axis suppression; blood sugar elevation that in diabetics may result in ketoacidosis or comma; water retention that in patients with history of congestive heart failure may result in shortness of breath, pulmonary edema, and decompensation with resultant heart failure; weight gain; swelling or edema; medication-induced neural toxicity; particulate matter embolism and blood vessel occlusion with resultant organ, and/or nervous system infarction; and/or aseptic necrosis of one or more joints. Finally, the patient was informed that Medicine is not an exact science; therefore, there is also the possibility of unforeseen or unpredictable risks and/or possible complications that may result in a catastrophic outcome. The patient indicated having understood very clearly. We have given the patient no guarantees and we have made no promises. Enough time was given to the patient to ask questions, all of which were answered to the patient's satisfaction. Ms. Gerhold has indicated that she wanted to continue with the procedure. Attestation: I, the ordering provider, attest that I have discussed with the patient the benefits, risks, side-effects, alternatives, likelihood of achieving goals, and potential problems during recovery for the procedure that I have provided informed consent. Date  Time: 01/16/2019  8:21 AM  Pre-Procedure Preparation:  Monitoring: As per clinic protocol. Respiration, ETCO2, SpO2, BP, heart rate and rhythm monitor placed and checked for adequate function Safety Precautions: Patient was assessed for positional comfort and pressure points before starting the procedure. Time-out: I initiated and conducted the "Time-out" before starting the procedure, as per protocol. The patient was asked to participate by confirming the accuracy of the "Time Out" information. Verification of the correct person, site, and procedure were performed and confirmed by me, the nursing  staff, and the patient. "Time-out" conducted as per Joint Commission's Universal Protocol (UP.01.01.01). Time: 0917  Description of Procedure:          Laterality: Right Levels:  L2, L3, L4, L5, & S1 Medial Branch Level(s), at the L3-4, L4-5, and the L5-S1 lumbar facet joints. Area Prepped: Lumbosacral Prepping solution: ChloraPrep (2% chlorhexidine gluconate and 70% isopropyl alcohol) Safety Precautions: Aspiration looking for blood return was conducted prior to all injections. At no point did we inject any substances, as a needle  was being advanced. Before injecting, the patient was told to immediately notify me if she was experiencing any new onset of "ringing in the ears, or metallic taste in the mouth". No attempts were made at seeking any paresthesias. Safe injection practices and needle disposal techniques used. Medications properly checked for expiration dates. SDV (single dose vial) medications used. After the completion of the procedure, all disposable equipment used was discarded in the proper designated medical waste containers. Local Anesthesia: Protocol guidelines were followed. The patient was positioned over the fluoroscopy table. The area was prepped in the usual manner. The time-out was completed. The target area was identified using fluoroscopy. A 12-in long, straight, sterile hemostat was used with fluoroscopic guidance to locate the targets for each level blocked. Once located, the skin was marked with an approved surgical skin marker. Once all sites were marked, the skin (epidermis, dermis, and hypodermis), as well as deeper tissues (fat, connective tissue and muscle) were infiltrated with a small amount of a short-acting local anesthetic, loaded on a 10cc syringe with a 25G, 1.5-in  Needle. An appropriate amount of time was allowed for local anesthetics to take effect before proceeding to the next step. Local Anesthetic: Lidocaine 2.0% The unused portion of the local anesthetic was  discarded in the proper designated containers. Technical explanation of process:  Radiofrequency Ablation (RFA) L2 Medial Branch Nerve RFA: The target area for the L2 medial branch is at the junction of the postero-lateral aspect of the superior articular process and the superior, posterior, and medial edge of the transverse process of L3. Under fluoroscopic guidance, a Radiofrequency needle was inserted until contact was made with os over the superior postero-lateral aspect of the pedicular shadow (target area). Sensory and motor testing was conducted to properly adjust the position of the needle. Once satisfactory placement of the needle was achieved, the numbing solution was slowly injected after negative aspiration for blood. 2.0 mL of the nerve block solution was injected without difficulty or complication. After waiting for at least 3 minutes, the ablation was performed. Once completed, the needle was removed intact. L3 Medial Branch Nerve RFA: The target area for the L3 medial branch is at the junction of the postero-lateral aspect of the superior articular process and the superior, posterior, and medial edge of the transverse process of L4. Under fluoroscopic guidance, a Radiofrequency needle was inserted until contact was made with os over the superior postero-lateral aspect of the pedicular shadow (target area). Sensory and motor testing was conducted to properly adjust the position of the needle. Once satisfactory placement of the needle was achieved, the numbing solution was slowly injected after negative aspiration for blood. 2.0 mL of the nerve block solution was injected without difficulty or complication. After waiting for at least 3 minutes, the ablation was performed. Once completed, the needle was removed intact. L4 Medial Branch Nerve RFA: The target area for the L4 medial branch is at the junction of the postero-lateral aspect of the superior articular process and the superior, posterior,  and medial edge of the transverse process of L5. Under fluoroscopic guidance, a Radiofrequency needle was inserted until contact was made with os over the superior postero-lateral aspect of the pedicular shadow (target area). Sensory and motor testing was conducted to properly adjust the position of the needle. Once satisfactory placement of the needle was achieved, the numbing solution was slowly injected after negative aspiration for blood. 2.0 mL of the nerve block solution was injected without difficulty or complication. After waiting  for at least 3 minutes, the ablation was performed. Once completed, the needle was removed intact. L5 Medial Branch Nerve RFA: The target area for the L5 medial branch is at the junction of the postero-lateral aspect of the superior articular process of S1 and the superior, posterior, and medial edge of the sacral ala. Under fluoroscopic guidance, a Radiofrequency needle was inserted until contact was made with os over the superior postero-lateral aspect of the pedicular shadow (target area). Sensory and motor testing was conducted to properly adjust the position of the needle. Once satisfactory placement of the needle was achieved, the numbing solution was slowly injected after negative aspiration for blood. 2.0 mL of the nerve block solution was injected without difficulty or complication. After waiting for at least 3 minutes, the ablation was performed. Once completed, the needle was removed intact. S1 Medial Branch Nerve RFA: The target area for the S1 medial branch is located inferior to the junction of the S1 superior articular process and the L5 inferior articular process, posterior, inferior, and lateral to the 6 o'clock position of the L5-S1 facet joint, just superior to the S1 posterior foramen. Under fluoroscopic guidance, the Radiofrequency needle was advanced until contact was made with os over the Target area. Sensory and motor testing was conducted to properly  adjust the position of the needle. Once satisfactory placement of the needle was achieved, the numbing solution was slowly injected after negative aspiration for blood. 2.0 mL of the nerve block solution was injected without difficulty or complication. After waiting for at least 3 minutes, the ablation was performed. Once completed, the needle was removed intact. Radiofrequency lesioning (ablation):  Radiofrequency Generator: NeuroTherm [id number] Sensory Stimulation Parameters: 50 Hz was used to locate & identify the nerve, making sure that the needle was positioned such that there was no sensory stimulation below 0.3 V or above 0.7 V. Motor Stimulation Parameters: 2 Hz was used to evaluate the motor component. Care was taken not to lesion any nerves that demonstrated motor stimulation of the lower extremities at an output of less than 2.5 times that of the sensory threshold, or a maximum of 2.0 V. Lesioning Technique Parameters: Standard Radiofrequency settings. (Not bipolar or pulsed.) Temperature Settings: 80 degrees C Lesioning time: 60 seconds Intra-operative Compliance: Compliant Materials & Medications: Needle(s) (Electrode/Cannula) Type: Teflon-coated, curved tip, Radiofrequency needle(s) Gauge: 22G Length: 10cm Numbing solution: 0.2% PF-Ropivacaine + Triamcinolone (40 mg/mL) diluted to a final concentration of 4 mg of Triamcinolone/mL of Ropivacaine The unused portion of the solution was discarded in the proper designated containers.  Once the entire procedure was completed, the treated area was cleaned, making sure to leave some of the prepping solution back to take advantage of its long term bactericidal properties.  Illustration of the posterior view of the lumbar spine and the posterior neural structures. Laminae of L2 through S1 are labeled. DPRL5, dorsal primary ramus of L5; DPRS1, dorsal primary ramus of S1; DPR3, dorsal primary ramus of L3; FJ, facet (zygapophyseal) joint L3-L4; I,  inferior articular process of L4; LB1, lateral branch of dorsal primary ramus of L1; IAB, inferior articular branches from L3 medial branch (supplies L4-L5 facet joint); IBP, intermediate branch plexus; MB3, medial branch of dorsal primary ramus of L3; NR3, third lumbar nerve root; S, superior articular process of L5; SAB, superior articular branches from L4 (supplies L4-5 facet joint also); TP3, transverse process of L3.  Vitals:   01/16/19 0955 01/16/19 0958 01/16/19 1008 01/16/19 1018  BP: (!) 143/0 (!) 141/88 Marland Kitchen)  160/103 (!) 156/106  Pulse: 71     Resp: 13 (!) 23 18 16   Temp:  98.7 F (37.1 C)    SpO2: 99% 97% 97% 98%  Weight:      Height:        Start Time: 0917 hrs. End Time: 0953 hrs.  Imaging Guidance (Spinal):          Type of Imaging Technique: Fluoroscopy Guidance (Spinal) Indication(s): Assistance in needle guidance and placement for procedures requiring needle placement in or near specific anatomical locations not easily accessible without such assistance. Exposure Time: Please see nurses notes. Contrast: None used. Fluoroscopic Guidance: I was personally present during the use of fluoroscopy. "Tunnel Vision Technique" used to obtain the best possible view of the target area. Parallax error corrected before commencing the procedure. "Direction-depth-direction" technique used to introduce the needle under continuous pulsed fluoroscopy. Once target was reached, antero-posterior, oblique, and lateral fluoroscopic projection used confirm needle placement in all planes. Images permanently stored in EMR. Interpretation: No contrast injected. I personally interpreted the imaging intraoperatively. Adequate needle placement confirmed in multiple planes. Permanent images saved into the patient's record.  Antibiotic Prophylaxis:   Anti-infectives (From admission, onward)   None     Indication(s): None identified  Post-operative Assessment:  Post-procedure Vital Signs:  Pulse/HCG  Rate: 7194 Temp: 98.7 F (37.1 C) Resp: 16 BP: (!) 156/106 SpO2: 98 %  EBL: None  Complications: No immediate post-treatment complications observed by team, or reported by patient.  Note: The patient tolerated the entire procedure well. A repeat set of vitals were taken after the procedure and the patient was kept under observation following institutional policy, for this type of procedure. Post-procedural neurological assessment was performed, showing return to baseline, prior to discharge. The patient was provided with post-procedure discharge instructions, including a section on how to identify potential problems. Should any problems arise concerning this procedure, the patient was given instructions to immediately contact us, at any time, without hesitation. In any case, we plan to contact the patient by telephone for a follow-up status report regarding this interventional procedure.  Comments:  No additional relevant information.  Plan of Care   Imaging Orders     DG C-Arm 1-60 Min-No Report  Procedure Orders     Radiofrequency,Lumbar  Medications ordered for procedure: Meds ordered this encounter  Medications  . lidocaine (XYLOCAINE) 2 % (with pres) injection 400 mg  . midazolam (VERSED) 5 MG/5ML injection 1-2 mg    Make sure Flumazenil is available in the pyxis when using this medication. If oversedation occurs, administer 0.2 mg IV over 15 sec. If after 45 sec no response, administer 0.2 mg again over 1 min; may repeat at 1 min intervals; not to exceed 4 doses (1 mg)  . lactated ringers infusion 1,000 mL  . ropivacaine (PF) 2 mg/mL (0.2%) (NAROPIN) injection 18 mL  . triamcinolone acetonide (KENALOG-40) injection 80 mg  . oxyCODONE-acetaminophen (PERCOCET) 5-325 MG tablet    Sig: Take 1 tablet by mouth every 8 (eight) hours as needed for up to 7 days for severe pain. Must last 7 days.    Dispense:  21 tablet    Refill:  0    For acute post-operative pain. Not to be  refilled.  Must last 7 days.  Marland Kitchen oxyCODONE-acetaminophen (PERCOCET) 5-325 MG tablet    Sig: Take 1 tablet by mouth every 8 (eight) hours as needed for up to 7 days for severe pain. Must last 7 days.  Dispense:  21 tablet    Refill:  0    For acute post-operative pain. Not to be refilled.  Must last 7 days.   Medications administered: We administered lidocaine, midazolam, lactated ringers, ropivacaine (PF) 2 mg/mL (0.2%), and triamcinolone acetonide.  See the medical record for exact dosing, route, and time of administration.  Disposition: Discharge home  Discharge Date & Time: 01/16/2019; 1021 hrs.   Physician-requested Follow-up: Return in about 2 weeks (around 01/30/2019) for RFA (fluoro + sedation): (L) L-FCT RFA #1.  Future Appointments  Date Time Provider Department Center  01/30/2019  2:15 PM Delano MetzNaveira, Sarkis Rhines, MD [facility] None  03/17/2019  9:00 AM Barbette [facility]inoKing[facility]   Primary Care Physician: Sherron Mondayejan-[name], S Ahmed, MD [facility] Note by: Oswaldo DoneFrancisco A Siddharth Babington, MD Date: 01/16/2019; Time: 10:26 AM  Disclaimer:  Medicine is not an exact science. The only guarantee in medicine is that nothing is guaranteed. It is important to note that the decision to proceed with this intervention was based on the information collected from the patient. The Data and conclusions were drawn from the patient's questionnaire, the interview, and the physical examination. Because the information was provided in large part by the patient, it cannot be guaranteed that it has not been purposely or unconsciously manipulated. Every effort has been made to obtain as much relevant data as possible for this evaluation. It is important to note that the conclusions that lead to this procedure are derived in large part from the available data. Always take into account that the treatment will also be dependent on availability of resources and existing treatment guidelines,  considered by other Pain Management Practitioners as being common knowledge and practice, at the time of the intervention. For Medico-Legal purposes, it is also important to point out that variation in procedural techniques and pharmacological choices are the acceptable norm. The indications, contraindications, technique, and results of the above procedure should only be interpreted and judged by a Board-Certified Interventional Pain Specialist with extensive familiarity and expertise in the same exact procedure and technique.

## 2019-01-17 ENCOUNTER — Telehealth: Payer: Self-pay

## 2019-01-17 NOTE — Telephone Encounter (Signed)
Post procedure phone call.  Patient states she is doing well.  

## 2019-01-30 ENCOUNTER — Other Ambulatory Visit: Payer: Self-pay

## 2019-01-30 ENCOUNTER — Ambulatory Visit
Admission: RE | Admit: 2019-01-30 | Discharge: 2019-01-30 | Disposition: A | Discharge status: Home / Self Care | Payer: Medicare Other | Source: Ambulatory Visit | Attending: Pain Medicine | Admitting: Pain Medicine

## 2019-01-30 ENCOUNTER — Ambulatory Visit (HOSPITAL_BASED_OUTPATIENT_CLINIC_OR_DEPARTMENT_OTHER): Payer: Medicare Other | Admitting: Pain Medicine

## 2019-01-30 ENCOUNTER — Encounter: Payer: Self-pay | Admitting: Pain Medicine

## 2019-01-30 VITALS — BP 149/100 | HR 84 | Temp 98.0°F | Resp 15 | Ht 63.0 in | Wt 155.0 lb

## 2019-01-30 DIAGNOSIS — M47816 Spondylosis without myelopathy or radiculopathy, lumbar region: Secondary | ICD-10-CM

## 2019-01-30 DIAGNOSIS — M47817 Spondylosis without myelopathy or radiculopathy, lumbosacral region: Secondary | ICD-10-CM | POA: Diagnosis present

## 2019-01-30 DIAGNOSIS — G8918 Other acute postprocedural pain: Secondary | ICD-10-CM | POA: Diagnosis present

## 2019-01-30 DIAGNOSIS — M5136 Other intervertebral disc degeneration, lumbar region: Secondary | ICD-10-CM

## 2019-01-30 MED ORDER — LACTATED RINGERS IV SOLN
1000.0000 mL | Freq: Once | INTRAVENOUS | Status: AC
  Administered 2019-01-30: 1000 mL via INTRAVENOUS

## 2019-01-30 MED ORDER — TRIAMCINOLONE ACETONIDE 40 MG/ML IJ SUSP
40.0000 mg | Freq: Once | INTRAMUSCULAR | Status: AC
  Administered 2019-01-30: 40 mg
  Filled 2019-01-30: qty 1

## 2019-01-30 MED ORDER — LIDOCAINE HCL 2 % IJ SOLN
20.0000 mL | Freq: Once | INTRAMUSCULAR | Status: AC
  Administered 2019-01-30: 400 mg
  Filled 2019-01-30: qty 40

## 2019-01-30 MED ORDER — MIDAZOLAM HCL 5 MG/5ML IJ SOLN
1.0000 mg | INTRAMUSCULAR | Status: AC | PRN
  Administered 2019-01-30: 3 mg via INTRAVENOUS
  Filled 2019-01-30: qty 5

## 2019-01-30 MED ORDER — OXYCODONE-ACETAMINOPHEN 5-325 MG PO TABS: 1.0000 | ORAL_TABLET | Freq: Three times a day (TID) | ORAL | 0 refills | Status: AC | PRN

## 2019-01-30 MED ORDER — ROPIVACAINE HCL 2 MG/ML IJ SOLN
9.0000 mL | Freq: Once | INTRAMUSCULAR | Status: AC
  Administered 2019-01-30: 9 mL via PERINEURAL
  Filled 2019-01-30: qty 10

## 2019-01-30 NOTE — Progress Notes (Signed)
Patient's Name: Jean Gomez  MRN: [id number]  Referring Provider: Sherron Mondayejan-[name], S Ahmed, MD  DOB: 1956/05/09  PCP: Jean Mondayejan-[name], S Ahmed, MD  DOS: 01/30/2019  Note by: Jean DoneFrancisco A Shontell Prosser, MD  Service setting: Ambulatory outpatient  Specialty: Interventional Pain Management  Patient type: Established  Location: [facility] (AMB) Pain Management Facility  Visit type: Interventional Procedure   Primary Reason for Visit: Interventional Pain Management Treatment. CC: Back Pain (low)  Procedure:          Anesthesia, Analgesia, Anxiolysis:  Type: Thermal Lumbar Facet, Medial Branch Radiofrequency Ablation/Neurotomy  #1  Primary Purpose: Therapeutic Region: Posterolateral Lumbosacral Spine Level: L2, L3, L4, L5, & S1 Medial Branch Level(s). These levels will denervate the L3-4, L4-5, and the L5-S1 lumbar facet joints. Laterality: Left  Type: Moderate (Conscious) Sedation combined with Local Anesthesia Indication(s): Analgesia and Anxiety Route: Intravenous (IV) IV Access: Secured Sedation: Meaningful verbal contact was maintained at all times during the procedure  Local Anesthetic: Lidocaine 1-2%  Position: Prone   Indications: 1. Spondylosis without myelopathy or radiculopathy, lumbosacral region   2. Lumbar facet syndrome (Bilateral) (R>L)   3. Lumbar spondylosis   4. DDD (degenerative disc disease), lumbar   5. Osteoarthritis of facet joint of lumbar spine   6. Osteoarthritis of lumbar spine    Jean Gomez has been dealing with the above chronic pain for longer than three months and has either failed to respond, was unable to tolerate, or simply did not get enough benefit from other more conservative therapies including, but not limited to: 1. Over-the-counter medications 2. Anti-inflammatory medications 3. Muscle relaxants 4. Membrane stabilizers 5. Opioids 6. Physical therapy and/or chiropractic manipulation 7. Modalities (Heat, ice, etc.) 8. Invasive techniques such as nerve blocks. Ms.  Manson Gomez has attained more than 50% relief of the pain from a series of diagnostic injections conducted in separate occasions.  Pain Score: Pre-procedure: 10-Worst pain ever/10 Post-procedure: 0-No pain/10  Pre-op Assessment:  Jean Gomez is a 63 y.o. (year old), female patient, seen today for interventional treatment. She  has a past surgical history that includes Cholecystectomy; Abdominal hysterectomy; and Spinal fusion. Jean Gomez has a current medication list which includes the following prescription(s): albuterol sulfate, alprazolam, amlodipine, aspirin ec, atorvastatin, betamethasone (augmented), citalopram, clotrimazole-betamethasone, diclofenac sodium, diphenhydramine, medical compression stockings, hydrochlorothiazide, hydrocodone-acetaminophen, hydrocodone-acetaminophen, ipratropium, levetiracetam, lidocaine, linaclotide, losartan, nitroglycerin, ondansetron, pantoprazole, potassium chloride sa, quetiapine, sucralfate, tizanidine, torsemide, venlafaxine, hydrocodone-acetaminophen, oxycodone-acetaminophen, oxycodone-acetaminophen, prednisone, and quetiapine. Her primarily concern today is the Back Pain (low)  Initial Vital Signs:  Pulse/HCG Rate: 93ECG Heart Rate: 87 Temp: 98.7 F (37.1 C) Resp: 18 BP: (!) 126/94 SpO2: 100 %  BMI: Estimated body mass index is 27.46 kg/m as calculated from the following:   Height as of this encounter: 5\' 3"  (1.6 m).   Weight as of this encounter: 155 lb (70.3 kg).  Risk Assessment: Allergies: Reviewed. She is allergic to contrast media [iodinated diagnostic agents]; gabapentin; labetalol; sulfabenzamide; fentanyl; and penicillins.  Allergy Precautions: None required Coagulopathies: Reviewed. None identified.  Blood-thinner therapy: None at this time Active Infection(s): Reviewed. None identified. Jean Gomez is afebrile  Site Confirmation: Jean Gomez was asked to confirm the procedure and laterality before marking the site Procedure checklist:  Completed Consent: Before the procedure and under the influence of no sedative(s), amnesic(s), or anxiolytics, the patient was informed of the treatment options, risks and possible complications. To fulfill our ethical and legal obligations, as recommended by the American Medical Association's Code of Ethics, I have informed the patient  of my clinical impression; the nature and purpose of the treatment or procedure; the risks, benefits, and possible complications of the intervention; the alternatives, including doing nothing; the risk(s) and benefit(s) of the alternative treatment(s) or procedure(s); and the risk(s) and benefit(s) of doing nothing. The patient was provided information about the general risks and possible complications associated with the procedure. These may include, but are not limited to: failure to achieve desired goals, infection, bleeding, organ or nerve damage, allergic reactions, paralysis, and death. In addition, the patient was informed of those risks and complications associated to Spine-related procedures, such as failure to decrease pain; infection (i.e.: Meningitis, epidural or intraspinal abscess); bleeding (i.e.: epidural hematoma, subarachnoid hemorrhage, or any other type of intraspinal or peri-dural bleeding); organ or nerve damage (i.e.: Any type of peripheral nerve, nerve root, or spinal cord injury) with subsequent damage to sensory, motor, and/or autonomic systems, resulting in permanent pain, numbness, and/or weakness of one or several areas of the body; allergic reactions; (i.e.: anaphylactic reaction); and/or death. Furthermore, the patient was informed of those risks and complications associated with the medications. These include, but are not limited to: allergic reactions (i.e.: anaphylactic or anaphylactoid reaction(s)); adrenal axis suppression; blood sugar elevation that in diabetics may result in ketoacidosis or comma; water retention that in patients with history  of congestive heart failure may result in shortness of breath, pulmonary edema, and decompensation with resultant heart failure; weight gain; swelling or edema; medication-induced neural toxicity; particulate matter embolism and blood vessel occlusion with resultant organ, and/or nervous system infarction; and/or aseptic necrosis of one or more joints. Finally, the patient was informed that Medicine is not an exact science; therefore, there is also the possibility of unforeseen or unpredictable risks and/or possible complications that may result in a catastrophic outcome. The patient indicated having understood very clearly. We have given the patient no guarantees and we have made no promises. Enough time was given to the patient to ask questions, all of which were answered to the patient's satisfaction. Ms. Ines has indicated that she wanted to continue with the procedure. Attestation: I, the ordering provider, attest that I have discussed with the patient the benefits, risks, side-effects, alternatives, likelihood of achieving goals, and potential problems during recovery for the procedure that I have provided informed consent. Date  Time: 01/30/2019  2:21 PM  Pre-Procedure Preparation:  Monitoring: As per clinic protocol. Respiration, ETCO2, SpO2, BP, heart rate and rhythm monitor placed and checked for adequate function Safety Precautions: Patient was assessed for positional comfort and pressure points before starting the procedure. Time-out: I initiated and conducted the "Time-out" before starting the procedure, as per protocol. The patient was asked to participate by confirming the accuracy of the "Time Out" information. Verification of the correct person, site, and procedure were performed and confirmed by me, the nursing staff, and the patient. "Time-out" conducted as per Joint Commission's Universal Protocol (UP.01.01.01). Time: 84  Description of Procedure:          Laterality: Left Levels:   L2, L3, L4, L5, & S1 Medial Branch Level(s), at the L3-4, L4-5, and the L5-S1 lumbar facet joints. Area Prepped: Lumbosacral Prepping solution: ChloraPrep (2% chlorhexidine gluconate and 70% isopropyl alcohol) Safety Precautions: Aspiration looking for blood return was conducted prior to all injections. At no point did we inject any substances, as a needle was being advanced. Before injecting, the patient was told to immediately notify me if she was experiencing any new onset of "ringing in the ears, or  metallic taste in the mouth". No attempts were made at seeking any paresthesias. Safe injection practices and needle disposal techniques used. Medications properly checked for expiration dates. SDV (single dose vial) medications used. After the completion of the procedure, all disposable equipment used was discarded in the proper designated medical waste containers. Local Anesthesia: Protocol guidelines were followed. The patient was positioned over the fluoroscopy table. The area was prepped in the usual manner. The time-out was completed. The target area was identified using fluoroscopy. A 12-in long, straight, sterile hemostat was used with fluoroscopic guidance to locate the targets for each level blocked. Once located, the skin was marked with an approved surgical skin marker. Once all sites were marked, the skin (epidermis, dermis, and hypodermis), as well as deeper tissues (fat, connective tissue and muscle) were infiltrated with a small amount of a short-acting local anesthetic, loaded on a 10cc syringe with a 25G, 1.5-in  Needle. An appropriate amount of time was allowed for local anesthetics to take effect before proceeding to the next step. Local Anesthetic: Lidocaine 2.0% The unused portion of the local anesthetic was discarded in the proper designated containers. Technical explanation of process:  Radiofrequency Ablation (RFA) L2 Medial Branch Nerve RFA: The target area for the L2 medial branch  is at the junction of the postero-lateral aspect of the superior articular process and the superior, posterior, and medial edge of the transverse process of L3. Under fluoroscopic guidance, a Radiofrequency needle was inserted until contact was made with os over the superior postero-lateral aspect of the pedicular shadow (target area). Sensory and motor testing was conducted to properly adjust the position of the needle. Once satisfactory placement of the needle was achieved, the numbing solution was slowly injected after negative aspiration for blood. 2.0 mL of the nerve block solution was injected without difficulty or complication. After waiting for at least 3 minutes, the ablation was performed. Once completed, the needle was removed intact. L3 Medial Branch Nerve RFA: The target area for the L3 medial branch is at the junction of the postero-lateral aspect of the superior articular process and the superior, posterior, and medial edge of the transverse process of L4. Under fluoroscopic guidance, a Radiofrequency needle was inserted until contact was made with os over the superior postero-lateral aspect of the pedicular shadow (target area). Sensory and motor testing was conducted to properly adjust the position of the needle. Once satisfactory placement of the needle was achieved, the numbing solution was slowly injected after negative aspiration for blood. 2.0 mL of the nerve block solution was injected without difficulty or complication. After waiting for at least 3 minutes, the ablation was performed. Once completed, the needle was removed intact. L4 Medial Branch Nerve RFA: The target area for the L4 medial branch is at the junction of the postero-lateral aspect of the superior articular process and the superior, posterior, and medial edge of the transverse process of L5. Under fluoroscopic guidance, a Radiofrequency needle was inserted until contact was made with os over the superior postero-lateral  aspect of the pedicular shadow (target area). Sensory and motor testing was conducted to properly adjust the position of the needle. Once satisfactory placement of the needle was achieved, the numbing solution was slowly injected after negative aspiration for blood. 2.0 mL of the nerve block solution was injected without difficulty or complication. After waiting for at least 3 minutes, the ablation was performed. Once completed, the needle was removed intact. L5 Medial Branch Nerve RFA: The target area for the  L5 medial branch is at the junction of the postero-lateral aspect of the superior articular process of S1 and the superior, posterior, and medial edge of the sacral ala. Under fluoroscopic guidance, a Radiofrequency needle was inserted until contact was made with os over the superior postero-lateral aspect of the pedicular shadow (target area). Sensory and motor testing was conducted to properly adjust the position of the needle. Once satisfactory placement of the needle was achieved, the numbing solution was slowly injected after negative aspiration for blood. 2.0 mL of the nerve block solution was injected without difficulty or complication. After waiting for at least 3 minutes, the ablation was performed. Once completed, the needle was removed intact. S1 Medial Branch Nerve RFA: The target area for the S1 medial branch is located inferior to the junction of the S1 superior articular process and the L5 inferior articular process, posterior, inferior, and lateral to the 6 o'clock position of the L5-S1 facet joint, just superior to the S1 posterior foramen. Under fluoroscopic guidance, the Radiofrequency needle was advanced until contact was made with os over the Target area. Sensory and motor testing was conducted to properly adjust the position of the needle. Once satisfactory placement of the needle was achieved, the numbing solution was slowly injected after negative aspiration for blood. 2.0 mL of the  nerve block solution was injected without difficulty or complication. After waiting for at least 3 minutes, the ablation was performed. Once completed, the needle was removed intact. Radiofrequency lesioning (ablation):  Radiofrequency Generator: NeuroTherm [id number] Sensory Stimulation Parameters: 50 Hz was used to locate & identify the nerve, making sure that the needle was positioned such that there was no sensory stimulation below 0.3 V or above 0.7 V. Motor Stimulation Parameters: 2 Hz was used to evaluate the motor component. Care was taken not to lesion any nerves that demonstrated motor stimulation of the lower extremities at an output of less than 2.5 times that of the sensory threshold, or a maximum of 2.0 V. Lesioning Technique Parameters: Standard Radiofrequency settings. (Not bipolar or pulsed.) Temperature Settings: 80 degrees C Lesioning time: 60 seconds Intra-operative Compliance: Compliant Materials & Medications: Needle(s) (Electrode/Cannula) Type: Teflon-coated, curved tip, Radiofrequency needle(s) Gauge: 22G Length: 10cm Numbing solution: 0.2% PF-Ropivacaine + Triamcinolone (40 mg/mL) diluted to a final concentration of 4 mg of Triamcinolone/mL of Ropivacaine The unused portion of the solution was discarded in the proper designated containers.  Once the entire procedure was completed, the treated area was cleaned, making sure to leave some of the prepping solution back to take advantage of its long term bactericidal properties.  Illustration of the posterior view of the lumbar spine and the posterior neural structures. Laminae of L2 through S1 are labeled. DPRL5, dorsal primary ramus of L5; DPRS1, dorsal primary ramus of S1; DPR3, dorsal primary ramus of L3; FJ, facet (zygapophyseal) joint L3-L4; I, inferior articular process of L4; LB1, lateral branch of dorsal primary ramus of L1; IAB, inferior articular branches from L3 medial branch (supplies L4-L5 facet joint); IBP,  intermediate branch plexus; MB3, medial branch of dorsal primary ramus of L3; NR3, third lumbar nerve root; S, superior articular process of L5; SAB, superior articular branches from L4 (supplies L4-5 facet joint also); TP3, transverse process of L3.  Vitals:   01/30/19 1550 01/30/19 1600 01/30/19 1610 01/30/19 1622  BP: (!) 134/94 (!) 153/101 131/88 (!) 149/100  Pulse: 84     Resp: 16 20 20 15   Temp:  98 F (36.7 C)  98 F (36.7  C)  TempSrc:  Temporal  Temporal  SpO2: 100% 97% 97% 100%  Weight:      Height:        Start Time: 1507 hrs. End Time: 1550 hrs.  Imaging Guidance (Spinal):          Type of Imaging Technique: Fluoroscopy Guidance (Spinal) Indication(s): Assistance in needle guidance and placement for procedures requiring needle placement in or near specific anatomical locations not easily accessible without such assistance. Exposure Time: Please see nurses notes. Contrast: None used. Fluoroscopic Guidance: I was personally present during the use of fluoroscopy. "Tunnel Vision Technique" used to obtain the best possible view of the target area. Parallax error corrected before commencing the procedure. "Direction-depth-direction" technique used to introduce the needle under continuous pulsed fluoroscopy. Once target was reached, antero-posterior, oblique, and lateral fluoroscopic projection used confirm needle placement in all planes. Images permanently stored in EMR. Interpretation: No contrast injected. I personally interpreted the imaging intraoperatively. Adequate needle placement confirmed in multiple planes. Permanent images saved into the patient's record.  Antibiotic Prophylaxis:   Anti-infectives (From admission, onward)   None     Indication(s): None identified  Post-operative Assessment:  Post-procedure Vital Signs:  Pulse/HCG Rate: 8491 Temp: 98 F (36.7 C) Resp: 15 BP: (!) 149/100 SpO2: 100 %  EBL: None  Complications: No immediate post-treatment  complications observed by team, or reported by patient.  Note: The patient tolerated the entire procedure well. A repeat set of vitals were taken after the procedure and the patient was kept under observation following institutional policy, for this type of procedure. Post-procedural neurological assessment was performed, showing return to baseline, prior to discharge. The patient was provided with post-procedure discharge instructions, including a section on how to identify potential problems. Should any problems arise concerning this procedure, the patient was given instructions to immediately contact us, at any time, without hesitation. In any case, we plan to contact the patient by telephone for a follow-up status report regarding this interventional procedure.  Comments:  No additional relevant information.  Plan of Care    Imaging Orders     DG C-Arm 1-60 Min-No Report  Procedure Orders     Radiofrequency,Lumbar  Medications ordered for procedure: Meds ordered this encounter  Medications  . lidocaine (XYLOCAINE) 2 % (with pres) injection 400 mg  . midazolam (VERSED) 5 MG/5ML injection 1-2 mg    Make sure Flumazenil is available in the pyxis when using this medication. If oversedation occurs, administer 0.2 mg IV over 15 sec. If after 45 sec no response, administer 0.2 mg again over 1 min; may repeat at 1 min intervals; not to exceed 4 doses (1 mg)  . lactated ringers infusion 1,000 mL  . ropivacaine (PF) 2 mg/mL (0.2%) (NAROPIN) injection 9 mL  . triamcinolone acetonide (KENALOG-40) injection 40 mg  . oxyCODONE-acetaminophen (PERCOCET) 5-325 MG tablet    Sig: Take 1 tablet by mouth every 8 (eight) hours as needed for up to 7 days for severe pain. Must last 7 days.    Dispense:  21 tablet    Refill:  0    For acute post-operative pain. Not to be refilled.  Must last 7 days.  Marland Kitchen. oxyCODONE-acetaminophen (PERCOCET) 5-325 MG tablet    Sig: Take 1 tablet by mouth every 8 (eight) hours as  needed for up to 7 days for severe pain. Must last 7 days.    Dispense:  21 tablet    Refill:  0    For acute post-operative  pain. Not to be refilled.  Must last 7 days.   Medications administered: We administered lidocaine, midazolam, lactated ringers, ropivacaine (PF) 2 mg/mL (0.2%), and triamcinolone acetonide.  See the medical record for exact dosing, route, and time of administration.  Disposition: Discharge home  Discharge Date & Time: 01/30/2019; 1624 hrs.   Physician-requested Follow-up: Return for Post-RFA eval (6 wks), w/ Thad Ranger, NP.  Future Appointments  Date Time Provider Department Center  03/17/2019  9:00 AM Barbette Merino, NP [facility] None   Primary Care Physician: Jean Monday, MD Location: [facility] Outpatient Pain Management Facility Note by: Jean Done, MD Date: 01/30/2019; Time: 3:54 PM  Disclaimer:  Medicine is not an Visual merchandiser. The only guarantee in medicine is that nothing is guaranteed. It is important to note that the decision to proceed with this intervention was based on the information collected from the patient. The Data and conclusions were drawn from the patient's questionnaire, the interview, and the physical examination. Because the information was provided in large part by the patient, it cannot be guaranteed that it has not been purposely or unconsciously manipulated. Every effort has been made to obtain as much relevant data as possible for this evaluation. It is important to note that the conclusions that lead to this procedure are derived in large part from the available data. Always take into account that the treatment will also be dependent on availability of resources and existing treatment guidelines, considered by other Pain Management Practitioners as being common knowledge and practice, at the time of the intervention. For Medico-Legal purposes, it is also important to point out that variation in procedural techniques and  pharmacological choices are the acceptable norm. The indications, contraindications, technique, and results of the above procedure should only be interpreted and judged by a Board-Certified Interventional Pain Specialist with extensive familiarity and expertise in the same exact procedure and technique.

## 2019-01-30 NOTE — Progress Notes (Signed)
Safety precautions to be maintained throughout the outpatient stay will include: orient to surroundings, keep bed in low position, maintain call bell within reach at all times, provide assistance with transfer out of bed and ambulation.  

## 2019-01-30 NOTE — Patient Instructions (Addendum)
___________________________________________________________________________________________  Post-Radiofrequency (RF) Discharge Instructions  You have just completed a Radiofrequency Neurotomy.  The following instructions will provide you with information and guidelines for self-care upon discharge.  If at any time you have questions or concerns please call your physician. DO NOT DRIVE YOURSELF!!  Instructions:  Apply ice: Fill a plastic sandwich bag with crushed ice. Cover it with a small towel and apply to injection site. Apply for 15 minutes then remove x 15 minutes. Repeat sequence on day of procedure, until you go to bed. The purpose is to minimize swelling and discomfort after procedure.  Apply heat: Apply heat to procedure site starting the day following the procedure. The purpose is to treat any soreness and discomfort from the procedure.  Food intake: No eating limitations, unless stipulated above.  Nevertheless, if you have had sedation, you may experience some nausea.  In this case, it may be wise to wait at least two hours prior to resuming regular diet.  Physical activities: Keep activities to a minimum for the first 8 hours after the procedure. For the first 24 hours after the procedure, do not drive a motor vehicle,  Operate heavy machinery, power tools, or handle any weapons.  Consider walking with the use of an assistive device or accompanied by an adult for the first 24 hours.  Do not drink alcoholic beverages including beer.  Do not make any important decisions or sign any legal documents. Go home and rest today.  Resume activities tomorrow, as tolerated.  Use caution in moving about as you may experience mild leg weakness.  Use caution in cooking, use of household electrical appliances and climbing steps.  Driving: If you have received any sedation, you are not allowed to drive for 24 hours after your procedure.  Blood thinner: Restart your blood thinner 6 hours after your  procedure. (Only for those taking blood thinners)  Insulin: As soon as you can eat, you may resume your normal dosing schedule. (Only for those taking insulin)  Medications: May resume pre-procedure medications.  Do not take any drugs, other than what has been prescribed to you.  Infection prevention: Keep procedure site clean and dry.  Post-procedure Pain Diary: Extremely important that this be done correctly and accurately. Recorded information will be used to determine the next step in treatment.  Pain evaluated is that of treated area only. Do not include pain from an untreated area.  Complete every hour, on the hour, for the initial 8 hours. Set an alarm to help you do this part accurately.  Do not go to sleep and have it completed later. It will not be accurate.  Follow-up appointment: Keep your follow-up appointment after the procedure. Usually 2 weeks for most procedures. (6 weeks in the case of radiofrequency.) Bring you pain diary.   Expect:  From numbing medicine (AKA: Local Anesthetics): Numbness or decrease in pain.  Onset: Full effect within 15 minutes of injected.  Duration: It will depend on the type of local anesthetic used. On the average, 1 to 8 hours.   From steroids: Decrease in swelling or inflammation. Once inflammation is improved, relief of the pain will follow.  Onset of benefits: Depends on the amount of swelling present. The more swelling, the longer it will take for the benefits to be seen. In some cases, up to 10 days.  Duration: Steroids will stay in the system x 2 weeks. Duration of benefits will depend on multiple posibilities including persistent irritating factors.  From procedure: Some   discomfort is to be expected once the numbing medicine wears off. This should be minimal if ice and heat are applied as instructed.  Call if:  You experience numbness and weakness that gets worse with time, as opposed to wearing off.  He experience any unusual  bleeding, difficulty breathing, or loss of the ability to control your bowel and bladder. (This applies to Spinal procedures only)  You experience any redness, swelling, heat, red streaks, elevated temperature, fever, or any other signs of a possible infection.  Emergency Numbers:  Durning business hours (Monday - Thursday, 8:00 AM - 4:00 PM) (Friday, 9:00 AM - 12:00 Noon): [phone]  After hours: [phone] ____________________________________________________________________________________________    ______________________________________________________________________________________________  Specialty Pain Scale  Introduction:  There are significant differences in how pain is reported. The word pain usually refers to physical pain, but it is also a common synonym of suffering. The medical community uses a scale from 0 (zero) to 10 (ten) to report pain level. Zero (0) is described as "no pain", while ten (10) is described as "the worse pain you can imagine". The problem with this scale is that physical pain is reported along with suffering. Suffering refers to mental pain, or more often yet it refers to any unpleasant feeling, emotion or aversion associated with the perception of harm or threat of harm. It is the psychological component of pain.  Pain Specialists prefer to separate the two components. The pain scale used by this practice is the Verbal Numerical Rating Scale (VNRS-11). This scale is for the physical pain only. DO NOT INCLUDE how your pain psychologically affects you. This scale is for adults 21 years of age and older. It has 11 (eleven) levels. The 1st level is 0/10. This means: "right now, I have no pain". In the context of pain management, it also means: "right now, my physical pain is under control with the current therapy".  General Information:  The scale should reflect your current level of pain. Unless you are specifically asked for the level of your worst  pain, or your average pain. If you are asked for one of these two, then it should be understood that it is over the past 24 hours.  Levels 1 (one) through 5 (five) are described below, and can be treated as an outpatient. Ambulatory pain management facilities such as ours are more than adequate to treat these levels. Levels 6 (six) through 10 (ten) are also described below, however, these must be treated as a hospitalized patient. While levels 6 (six) and 7 (seven) may be evaluated at an urgent care facility, levels 8 (eight) through 10 (ten) constitute medical emergencies and as such, they belong in a hospital's emergency department. When having these levels (as described below), do not come to our office. Our facility is not equipped to manage these levels. Go directly to an urgent care facility or an emergency department to be evaluated.  Definitions:  Activities of Daily Living (ADL): Activities of daily living (ADL or ADLs) is a term used in healthcare to refer to people's daily self-care activities. Health professionals often use a person's ability or inability to perform ADLs as a measurement of their functional status, particularly in regard to people post injury, with disabilities and the elderly. There are two ADL levels: Basic and Instrumental. Basic Activities of Daily Living (BADL  or BADLs) consist of self-care tasks that include: Bathing and showering; personal hygiene and grooming (including brushing/combing/styling hair); dressing; Toilet hygiene (getting to the toilet,   cleaning oneself, and getting back up); eating and self-feeding (not including cooking or chewing and swallowing); functional mobility, often referred to as "transferring", as measured by the ability to walk, get in and out of bed, and get into and out of a chair; the broader definition (moving from one place to another while performing activities) is useful for people with different physical abilities who are still able to get  around independently. Basic ADLs include the things many people do when they get up in the morning and get ready to go out of the house: get out of bed, go to the toilet, bathe, dress, groom, and eat. On the average, loss of function typically follows a particular order. Hygiene is the first to go, followed by loss of toilet use and locomotion. The last to go is the ability to eat. When there is only one remaining area in which the person is independent, there is a 62.9% chance that it is eating and only a 3.5% chance that it is hygiene. Instrumental Activities of Daily Living (IADL or IADLs) are not necessary for fundamental functioning, but they let an individual live independently in a community. IADL consist of tasks that include: cleaning and maintaining the house; home establishment and maintenance; care of others (including selecting and supervising caregivers); care of pets; child rearing; managing money; managing financials (investments, etc.); meal preparation and cleanup; shopping for groceries and necessities; moving within the community; safety procedures and emergency responses; health management and maintenance (taking prescribed medications); and using the telephone or other form of communication.  Instructions:  Most patients tend to report their pain as a combination of two factors, their physical pain and their psychosocial pain. This last one is also known as "suffering" and it is reflection of how physical pain affects you socially and psychologically. From now on, report them separately.  From this point on, when asked to report your pain level, report only your physical pain. Use the following table for reference.  Pain Clinic Pain Levels (0-5/10)  Pain Level Score  Description  No Pain 0   Mild pain 1 Nagging, annoying, but does not interfere with basic activities of daily living (ADL). Patients are able to eat, bathe, get dressed, toileting (being able to get on and off the toilet  and perform personal hygiene functions), transfer (move in and out of bed or a chair without assistance), and maintain continence (able to control bladder and bowel functions). Blood pressure and heart rate are unaffected. A normal heart rate for a healthy adult ranges from 60 to 100 bpm (beats per minute).   Mild to moderate pain 2 Noticeable and distracting. Impossible to hide from other people. More frequent flare-ups. Still possible to adapt and function close to normal. It can be very annoying and may have occasional stronger flare-ups. With discipline, patients may get used to it and adapt.   Moderate pain 3 Interferes significantly with activities of daily living (ADL). It becomes difficult to feed, bathe, get dressed, get on and off the toilet or to perform personal hygiene functions. Difficult to get in and out of bed or a chair without assistance. Very distracting. With effort, it can be ignored when deeply involved in activities.   Moderately severe pain 4 Impossible to ignore for more than a few minutes. With effort, patients may still be able to manage work or participate in some social activities. Very difficult to concentrate. Signs of autonomic nervous system discharge are evident: dilated pupils (mydriasis); mild sweating (  diaphoresis); sleep interference. Heart rate becomes elevated (>115 bpm). Diastolic blood pressure (lower number) rises above 100 mmHg. Patients find relief in laying down and not moving.   Severe pain 5 Intense and extremely unpleasant. Associated with frowning face and frequent crying. Pain overwhelms the senses.  Ability to do any activity or maintain social relationships becomes significantly limited. Conversation becomes difficult. Pacing back and forth is common, as getting into a comfortable position is nearly impossible. Pain wakes you up from deep sleep. Physical signs will be obvious: pupillary dilation; increased sweating; goosebumps; brisk reflexes; cold,  clammy hands and feet; nausea, vomiting or dry heaves; loss of appetite; significant sleep disturbance with inability to fall asleep or to remain asleep. When persistent, significant weight loss is observed due to the complete loss of appetite and sleep deprivation.  Blood pressure and heart rate becomes significantly elevated. Caution: If elevated blood pressure triggers a pounding headache associated with blurred vision, then the patient should immediately seek attention at an urgent or emergency care unit, as these may be signs of an impending stroke.    Emergency Department Pain Levels (6-10/10)  Emergency Room Pain 6 Severely limiting. Requires emergency care and should not be seen or managed at an outpatient pain management facility. Communication becomes difficult and requires great effort. Assistance to reach the emergency department may be required. Facial flushing and profuse sweating along with potentially dangerous increases in heart rate and blood pressure will be evident.   Distressing pain 7 Self-care is very difficult. Assistance is required to transport, or use restroom. Assistance to reach the emergency department will be required. Tasks requiring coordination, such as bathing and getting dressed become very difficult.   Disabling pain 8 Self-care is no longer possible. At this level, pain is disabling. The individual is unable to do even the most "basic" activities such as walking, eating, bathing, dressing, transferring to a bed, or toileting. Fine motor skills are lost. It is difficult to think clearly.   Incapacitating pain 9 Pain becomes incapacitating. Thought processing is no longer possible. Difficult to remember your own name. Control of movement and coordination are lost.   The worst pain imaginable 10 At this level, most patients pass out from pain. When this level is reached, collapse of the autonomic nervous system occurs, leading to a sudden drop in blood pressure and  heart rate. This in turn results in a temporary and dramatic drop in blood flow to the brain, leading to a loss of consciousness. Fainting is one of the body's self defense mechanisms. Passing out puts the brain in a calmed state and causes it to shut down for a while, in order to begin the healing process.    Summary: 1. Refer to this scale when providing us with your pain level. 2. Be accurate and careful when reporting your pain level. This will help with your care. 3. Over-reporting your pain level will lead to loss of credibility. 4. Even a level of 1/10 means that there is pain and will be treated at our facility. 5. High, inaccurate reporting will be documented as "Symptom Exaggeration", leading to loss of credibility and suspicions of possible secondary gains such as obtaining more narcotics, or wanting to appear disabled, for fraudulent reasons. 6. Only pain levels of 5 or below will be seen at our facility. 7. Pain levels of 6 and above will be sent to the Emergency Department and the appointment cancelled. ______________________________________________________________________________________________    

## 2019-01-31 ENCOUNTER — Telehealth: Payer: Self-pay

## 2019-01-31 NOTE — Telephone Encounter (Signed)
Post procedure phone call.  Patient states she is doing well.  

## 2019-02-17 ENCOUNTER — Other Ambulatory Visit: Payer: Self-pay | Admitting: Nurse Practitioner

## 2019-02-17 ENCOUNTER — Telehealth: Payer: Self-pay | Admitting: Nurse Practitioner

## 2019-02-17 DIAGNOSIS — G8929 Other chronic pain: Secondary | ICD-10-CM

## 2019-02-17 DIAGNOSIS — M7918 Myalgia, other site: Principal | ICD-10-CM

## 2019-02-17 MED ORDER — TIZANIDINE HCL 4 MG PO TABS: 4.0000 mg | ORAL_TABLET | Freq: Three times a day (TID) | ORAL | 0 refills | Status: DC

## 2019-02-17 NOTE — Telephone Encounter (Signed)
Sent!

## 2019-02-17 NOTE — Telephone Encounter (Signed)
Patient called stating her pharmacy told her they dont have a refill on tizanidine and she was going to pick up today. Can this be sent to pharmacy, patient has appt 03-17-19 for meds mgmt

## 2019-03-08 ENCOUNTER — Encounter: Payer: Self-pay | Admitting: Emergency Medicine

## 2019-03-08 ENCOUNTER — Other Ambulatory Visit: Payer: Self-pay

## 2019-03-08 ENCOUNTER — Emergency Department
Admission: EM | Admit: 2019-03-08 | Discharge: 2019-03-08 | Disposition: A | Discharge status: Home / Self Care | Payer: Medicare Other | Attending: Emergency Medicine | Admitting: Emergency Medicine

## 2019-03-08 DIAGNOSIS — I10 Essential (primary) hypertension: Secondary | ICD-10-CM | POA: Diagnosis not present

## 2019-03-08 DIAGNOSIS — N390 Urinary tract infection, site not specified: Secondary | ICD-10-CM | POA: Insufficient documentation

## 2019-03-08 DIAGNOSIS — R300 Dysuria: Secondary | ICD-10-CM | POA: Diagnosis present

## 2019-03-08 DIAGNOSIS — J449 Chronic obstructive pulmonary disease, unspecified: Secondary | ICD-10-CM | POA: Diagnosis not present

## 2019-03-08 LAB — COMPREHENSIVE METABOLIC PANEL
ALT: 19 U/L (ref 0–44)
AST: 19 U/L (ref 15–41)
Albumin: 4.5 g/dL (ref 3.5–5.0)
Alkaline Phosphatase: 51 U/L (ref 38–126)
Anion gap: 7 (ref 5–15)
BUN: 15 mg/dL (ref 8–23)
CO2: 29 mmol/L (ref 22–32)
Calcium: 9 mg/dL (ref 8.9–10.3)
Chloride: 104 mmol/L (ref 98–111)
Creatinine, Ser: 0.7 mg/dL (ref 0.44–1.00)
GFR calc non Af Amer: >60 mL/min (ref >60)
Glucose, Bld: 108 mg/dL — ABNORMAL HIGH (ref 70–99)
Potassium: 4.2 mmol/L (ref 3.5–5.1)
Sodium: 140 mmol/L (ref 135–145)
Total Bilirubin: 0.4 mg/dL (ref 0.3–1.2)
Total Protein: 6.9 g/dL (ref 6.5–8.1)

## 2019-03-08 LAB — CBC
HCT: 40.3 % (ref 36.0–46.0)
Hemoglobin: 11.9 g/dL — ABNORMAL LOW (ref 12.0–15.0)
MCH: 26.9 pg (ref 26.0–34.0)
MCHC: 29.5 g/dL — ABNORMAL LOW (ref 30.0–36.0)
MCV: 91.2 fL (ref 80.0–100.0)
PLATELETS: 229 10*3/uL (ref 150–400)
RBC: 4.42 MIL/uL (ref 3.87–5.11)
RDW: 13.9 % (ref 11.5–15.5)
WBC: 8.2 10*3/uL (ref 4.0–10.5)
nRBC: 0 % (ref 0.0–0.2)

## 2019-03-08 LAB — URINALYSIS, COMPLETE (UACMP) WITH MICROSCOPIC
BILIRUBIN URINE: NEGATIVE
Glucose, UA: NEGATIVE mg/dL
KETONES UR: NEGATIVE mg/dL
Nitrite: NEGATIVE
Protein, ur: 100 mg/dL — AB
RBC / HPF: >50 RBC/hpf — ABNORMAL HIGH (ref 0–5)
Specific Gravity, Urine: 1.025 (ref 1.005–1.030)
WBC, UA: >50 WBC/hpf — ABNORMAL HIGH (ref 0–5)
pH: 7 (ref 5.0–8.0)

## 2019-03-08 MED ORDER — FOSFOMYCIN TROMETHAMINE 3 G PO PACK: 3.0000 g | PACK | Freq: Once | ORAL | 0 refills | Status: AC

## 2019-03-08 MED ORDER — FOSFOMYCIN TROMETHAMINE 3 G PO PACK
3.0000 g | PACK | ORAL | Status: AC
  Administered 2019-03-08: 3 g via ORAL
  Filled 2019-03-08: qty 3

## 2019-03-08 NOTE — ED Provider Notes (Signed)
[facility] Emergency Department Provider Note   ____________________________________________   First MD Initiated Contact with Patient 03/08/19 1631     (approximate)  I have reviewed the triage vital signs and the nursing notes.   HISTORY  Chief Complaint Dysuria    HPI Jean Gomez is a 63 y.o. female who has a complex medical history, but reports COPD on 3 L oxygen all the time, prior brain aneurysm, chlamydia years ago, previous hysterectomy, and she reports also multiple joint and chronic pain issues that are managed by the pain clinic  Patient today reports that about 3 to 4 days ago started noticed that she had a little bit of discomfort with urination, over the last couple of days it is worsened, she reports that she has a burning stinging feeling when she urinates and also has seen small amounts of blood in her urine.  She checked herself and reports it does not seem to be vaginal bleeding but at some blood in her urine when she urinates.  No new back pain.  No new numbness or weakness.  No chest pain no trouble breathing.  No fevers or chills.  Patient concerned she could be having urinary infection and she does report she is been on doxycycline recently for skin infection in and around her hair of the scalp but no recent urinary infections   Past Medical History:  Diagnosis Date   Allergy    Asthma    Cerebral aneurysm    Chest pain 11/01/2016   Chronic migraine    Chronic respiratory failure (HCC)    COPD (chronic obstructive pulmonary disease) (HCC)    Hypertension    Osteoarthritis    Pneumonia    12/2018   Sarcoid    Seizures [facility])     Patient Active Problem List   Diagnosis Date Noted   Chronic low back pain (Secondary Area of Pain) (Bilateral) (R>L) w/ sciatica 01/16/2019   Acute postoperative pain 01/16/2019   Acute on chronic respiratory failure with hypoxia (HCC) 12/18/2018   Cervicogenic headache  10/21/2018   Lumbar spondylosis 06/25/2018   History of allergy to radiographic contrast media 05/30/2018   Bipolar affective disorder, current episode manic (HCC) 04/17/2018   Chronic hip pain (Tertiary Area of Pain) (Bilateral) (R>L) 02/26/2018   DDD (degenerative disc disease), cervical 02/26/2018   Cervical facet arthropathy (Bilateral) 02/26/2018   Cervical facet syndrome (Bilateral) (R>L) 02/26/2018   Cervical Grade 1 Anterolisthesis of C4 over C5 (Degenerative) 02/26/2018   Cervical Grade 1 Retrolisthesis of C5 over C6 02/26/2018   Cervical foraminal stenosis (C3-4, C4-5, and C5-6) (Bilateral) 02/26/2018   DDD (degenerative disc disease), lumbar 02/26/2018   Osteoarthritis of lumbar spine 02/26/2018   Osteoarthritis of facet joint of lumbar spine 02/26/2018   Spondylosis without myelopathy or radiculopathy, cervicothoracic region 02/26/2018   Occipital headache (Right) 02/25/2018   Frontal headache (Left) 02/25/2018   Chronic low back pain (Midline) (Secondary Area of Pain) 02/25/2018   Chronic lumbar radiculopathy (Right) 02/25/2018   Osteoarthritis involving multiple joints 02/25/2018   Osteoarthritis of shoulder (Bilateral) 02/25/2018   Chronic musculoskeletal pain 02/25/2018   Lumbar facet syndrome (Bilateral) (R>L) 02/25/2018   Vitamin D deficiency 02/14/2018   Chronic pain syndrome 02/07/2018   Pharmacologic therapy 02/07/2018   Disorder of skeletal system 02/07/2018   Problems influencing health status 02/07/2018   Long term current use of opiate analgesic 02/07/2018   Chronic low back pain (Secondary Area of Pain) (Bilateral) (R>L) w/ sciatica (  Right) 02/07/2018   Chronic lower extremity pain [facility] Area of Pain) (Bilateral) (R>L) 02/07/2018   Chronic knee pain (Fourth Area of Pain) (Bilateral) (R>L) 02/07/2018   Chronic shoulder pain (Fifth Area of Pain) (Bilateral) (R>L) 02/07/2018   Occipital neuralgia (Primary Area of Pain)  (Bilateral) (R>L) 02/07/2018   Chronic neck pain (Bilateral) (R>L) 02/07/2018   Chronic sacroiliac joint pain (Right) 02/07/2018   H/O aneurysm 01/30/2018   History of depression 01/30/2018   History of seizure disorder 01/30/2018   Chronic headache disorder (Primary Area of Pain) 01/30/2018   Chronic migraine without aura 01/17/2018   Chronic respiratory failure with hypoxia (HCC) 01/17/2018   Constipation 01/17/2018   HTN, goal below 140/90 01/17/2018   Hypercholesterolemia 01/17/2018   Major depression in remission (HCC) 01/17/2018   COPD (chronic obstructive pulmonary disease) (HCC) 01/17/2018   Sarcoidosis 01/17/2018   Seizure disorder (HCC) 01/17/2018   Nonruptured cerebral aneurysm 10/22/2017   Obstructive sleep apnea on CPAP 09/11/2017   Generalized anxiety disorder 01/26/2017   Irritable bowel syndrome 01/26/2017   Pulmonary emphysema (HCC) 01/26/2017   GERD (gastroesophageal reflux disease) 08/02/2016   Hiatal hernia 08/02/2016   Essential tremor 07/19/2016   Muscle spasm 10/05/2015   Spondylosis without myelopathy or radiculopathy, lumbosacral region 10/05/2015   Rotator cuff injury 09/17/2015   Chronic shoulder pain (Right) 09/17/2015   Epilepsy (HCC) 07/14/2015   Insomnia 07/14/2015   Osteoarthritis of knee (Bilateral) (R>L) 06/24/2015    Past Surgical History:  Procedure Laterality Date   ABDOMINAL HYSTERECTOMY     CHOLECYSTECTOMY     SPINAL FUSION      Prior to Admission medications   Medication Sig Start Date End Date Taking? Authorizing Provider  Albuterol Sulfate 108 (90 Base) MCG/ACT AEPB Inhale 2 puffs into the lungs every 6 (six) hours as needed.    [provider]  amLODipine (NORVASC) 10 MG tablet Take 1 tablet (10 mg total) by mouth daily. 04/19/18   Clapacs, Jackquline Denmark, MD  aspirin EC 81 MG tablet Take 1 tablet (81 mg total) by mouth daily. 04/19/18   Clapacs, Jackquline Denmark, MD  atorvastatin (LIPITOR) 20 MG tablet Take  20 mg by mouth daily.    [provider]  betamethasone, augmented, (DIPROLENE) 0.05 % lotion Apply topically 2 (two) times daily.    [provider]  citalopram (CELEXA) 20 MG tablet Take 20 mg by mouth daily.    [provider]  clotrimazole-betamethasone (LOTRISONE) cream Apply 1 application topically 2 (two) times daily. 04/19/18   Clapacs, Jackquline Denmark, MD  diclofenac sodium (VOLTAREN) 1 % GEL Apply 4 g topically 4 (four) times daily.    [provider]  diphenhydrAMINE (BENADRYL) 25 MG tablet Take 25 mg by mouth every 6 (six) hours as needed.    [provider]  Elastic Bandages & Supports (MEDICAL COMPRESSION STOCKINGS) MISC Please provide compression stockings 12/30/18   Minna Antis, MD  fosfomycin (MONUROL) 3 g PACK Take 3 g by mouth once for 1 dose. Please mix in 8 oz of water, take by mouth once 03/08/19 03/08/19  Sharyn Creamer, MD  hydrochlorothiazide (HYDRODIURIL) 25 MG tablet Take 1 tablet (25 mg total) by mouth daily. 04/19/18   Clapacs, Jackquline Denmark, MD  HYDROcodone-acetaminophen (NORCO) 10-325 MG tablet Take 1 tablet by mouth every 6 (six) hours as needed for severe pain. 02/18/19 03/20/19  Barbette Merino, NP  HYDROcodone-acetaminophen (NORCO) 10-325 MG tablet Take 1 tablet by mouth every 6 (six) hours as needed for  severe pain. 01/19/19 02/18/19  Barbette Merino, NP  HYDROcodone-acetaminophen (NORCO) 10-325 MG tablet Take 1 tablet by mouth every 6 (six) hours as needed for severe pain. 12/20/18 01/19/19  Barbette Merino, NP  ipratropium (ATROVENT) 0.02 % nebulizer solution Take 0.5 mg by nebulization every 4 (four) hours as needed for wheezing or shortness of breath.    [provider]  levETIRAcetam (KEPPRA) 500 MG tablet Take 500 mg by mouth 2 (two) times daily. 11/20/18   [provider]  lidocaine (XYLOCAINE) 5 % ointment Apply 1 application topically 4 (four) times daily as needed for moderate pain. 12/20/18 03/20/19  Houston Siren, MD    linaclotide (LINZESS) 145 MCG CAPS capsule Take 145 mcg by mouth daily before breakfast.    [provider]  losartan (COZAAR) 25 MG tablet Take 1 tablet (25 mg total) by mouth daily. 04/19/18   Clapacs, Jackquline Denmark, MD  nitroGLYCERIN (NITROSTAT) 0.4 MG SL tablet Place 0.4 mg under the tongue every 5 (five) minutes as needed for chest pain.    [provider]  ondansetron (ZOFRAN) 4 MG tablet Take 4 mg by mouth every 8 (eight) hours as needed for nausea or vomiting.    [provider]  pantoprazole (PROTONIX) 40 MG tablet Take 1 tablet (40 mg total) by mouth daily. 04/19/18   Clapacs, Jackquline Denmark, MD  potassium chloride SA (K-DUR,KLOR-CON) 20 MEQ tablet Take 1 tablet (20 mEq total) by mouth 2 (two) times daily. 04/19/18   Clapacs, Jackquline Denmark, MD  predniSONE (DELTASONE) 10 MG tablet Label  & dispense according to the schedule below. 5 Pills PO for 1 day then, 4 Pills PO for 1 day, 3 Pills PO for 1 day, 2 Pills PO for 1 day, 1 Pill PO for 1 days then STOP. Patient not taking: Reported on 01/30/2019 12/20/18   Houston Siren, MD  QUEtiapine (SEROQUEL) 300 MG tablet Take 300 mg by mouth at bedtime.    [provider]  QUEtiapine (SEROQUEL) 400 MG tablet Take 400 mg by mouth at bedtime.    [provider]  sucralfate (CARAFATE) 1 g tablet Take 1 tablet (1 g total) by mouth 4 (four) times daily. 04/19/18   Clapacs, Jackquline Denmark, MD  tiZANidine (ZANAFLEX) 4 MG tablet Take 1 tablet (4 mg total) by mouth 3 (three) times daily. 02/17/19 04/18/19  Barbette Merino, NP  torsemide (DEMADEX) 20 MG tablet Take 1 tablet (20 mg total) by mouth daily. 12/30/18   Minna Antis, MD  venlafaxine (EFFEXOR) 75 MG tablet TK 1 T PO BID 09/06/18   [provider]    Allergies Contrast media [iodinated diagnostic agents]; Gabapentin; Labetalol; Sulfabenzamide; Fentanyl; and Penicillins  Family History  Problem Relation Age of Onset   Dementia Mother    Breast cancer Mother    Throat cancer  Father     Social History Social History   Tobacco Use   Smoking status: Never Smoker   Smokeless tobacco: Never Used  Substance Use Topics   Alcohol use: Yes    Comment: occasional   Drug use: No    Review of Systems Constitutional: No fever/chills Eyes: No visual changes. ENT: No sore throat. Cardiovascular: Denies chest pain. Respiratory: Denies shortness of breath.  Always on 3 L.  No new trouble breathing.  No wheezing.  No cough. Gastrointestinal: No abdominal pain suffer some slight discomfort mostly over the very low part of the pelvis when she urinates.   Genitourinary: See HPI  Musculoskeletal: Negative for back pain for her chronic pain that she experiences in her neck back in both knees but is treated well by the pain clinic. Skin: Negative for rash. Neurological: Negative for headaches, areas of focal weakness or numbness.    ____________________________________________   PHYSICAL EXAM:  VITAL SIGNS: ED Triage Vitals  Enc Vitals Group     BP 03/08/19 1623 (!) 159/98     Pulse Rate 03/08/19 1623 100     Resp 03/08/19 1623 20     Temp 03/08/19 1623 98.7 F (37.1 C)     Temp Source 03/08/19 1623 Oral     SpO2 03/08/19 1623 95 %     Weight 03/08/19 1624 150 lb (68 kg)     Height 03/08/19 1624 5\' 3"  (1.6 m)     Head Circumference --      Peak Flow --      Pain Score 03/08/19 1623 10     Pain Loc --      Pain Edu? --      Excl. in GC? --     Constitutional: Alert and oriented. Well appearing and in no acute distress.  She is very pleasant. Eyes: Conjunctivae are normal. Head: Atraumatic. Nose: No congestion/rhinnorhea. Mouth/Throat: Mucous membranes are moist. Neck: No stridor.  Cardiovascular: Normal rate, regular rhythm. Grossly normal heart sounds.  Good peripheral circulation. Respiratory: Normal respiratory effort.  No retractions. Lungs CTAB. Gastrointestinal: Soft and nontender for some very slight discomfort to palpation suprapubically.  No distention. Musculoskeletal: No lower extremity tenderness nor edema. Neurologic:  Normal speech and language. No gross focal neurologic deficits are appreciated.  Skin:  Skin is warm, dry and intact. No rash noted. Psychiatric: Mood and affect are normal. Speech and behavior are normal except occasionally she will go on tangents that do not necessarily seem connected to today but she quickly comes back to our present history.  ____________________________________________   LABS (all labs ordered are listed, but only abnormal results are displayed)  Labs Reviewed  CBC - Abnormal; Notable for the following components:      Result Value   Hemoglobin 11.9 (*)    MCHC 29.5 (*)    All other components within normal limits  URINALYSIS, COMPLETE (UACMP) WITH MICROSCOPIC - Abnormal; Notable for the following components:   Color, Urine YELLOW (*)    APPearance CLOUDY (*)    Hgb urine dipstick LARGE (*)    Protein, ur 100 (*)    Leukocytes,Ua MODERATE (*)    RBC / HPF >50 (*)    WBC, UA >50 (*)    Bacteria, UA RARE (*)    All other components within normal limits  COMPREHENSIVE METABOLIC PANEL - Abnormal; Notable for the following components:   Glucose, Bld 108 (*)    All other components within normal limits   ____________________________________________  EKG   ____________________________________________  RADIOLOGY   ____________________________________________   PROCEDURES  Procedure(s) performed: None  Procedures  Critical Care performed: No  ____________________________________________   INITIAL IMPRESSION / ASSESSMENT AND PLAN / ED COURSE  Pertinent labs & imaging results that were available during my care of the patient were reviewed by me and considered in my medical decision making (see chart for details).   Patient presents for concerns of dysuria and hematuria developing over the last 3 to 4 days.  Denies any associated systemic symptoms.  Baseline  oxygen, denies any fevers or chills, no respiratory symptoms.  Denies abdominal pain except reports discomfort with urination.  Reassuring clinical examination.  No evidence acute abdomen.  No CVA tenderness bilateral.  Given the patient's complex medical history, will check urinalysis and also check basic labs to evaluate this to her renal function remains normal.  Does report some hematuria, will wait to see if her urinalysis demonstrates infection if there is obvious signs of infection that likely I suspect that this is urinary tract infection and probable hemorrhagic cystitis.  However if no obvious signs of infection, would entertain other causes such as renal cyst, bladder tumor, kidney stone etc. as possible cause.  Denies associated new or concerning neurologic symptoms such as numbness weakness or new back pain.    ----------------------------------------- 7:43 PM on 03/08/2019 -----------------------------------------  Lab work are good reviewed shows good renal function, no leukocytosis.  Patient normal vital signs no signs or symptoms of sepsis.  UTI consistent with urinary tract infection.  Will initiate fosfomycin, first dose given here in second dose prescribed for 3 days from now.  Return precautions and treatment recommendations and follow-up discussed with the patient who is agreeable with the plan.   ____________________________________________   FINAL CLINICAL IMPRESSION(S) / ED DIAGNOSES  Final diagnoses:  Lower urinary tract infectious disease        Note:  This document was prepared using Dragon voice recognition software and may include unintentional dictation errors       Sharyn CreamerQuale, Albirta Rhinehart, MD 03/08/19 1944

## 2019-03-08 NOTE — ED Triage Notes (Signed)
Discomfort with urination x 3 days. Today noted traces if blood in urine.

## 2019-03-13 ENCOUNTER — Ambulatory Visit: Payer: Self-pay | Admitting: Nurse Practitioner

## 2019-03-17 ENCOUNTER — Other Ambulatory Visit: Payer: Self-pay

## 2019-03-17 ENCOUNTER — Ambulatory Visit: Payer: Medicare Other | Attending: Nurse Practitioner | Admitting: Nurse Practitioner

## 2019-03-17 DIAGNOSIS — M79604 Pain in right leg: Secondary | ICD-10-CM

## 2019-03-17 DIAGNOSIS — M47817 Spondylosis without myelopathy or radiculopathy, lumbosacral region: Secondary | ICD-10-CM

## 2019-03-17 DIAGNOSIS — M79605 Pain in left leg: Secondary | ICD-10-CM

## 2019-03-17 DIAGNOSIS — M7918 Myalgia, other site: Secondary | ICD-10-CM

## 2019-03-17 DIAGNOSIS — G8929 Other chronic pain: Secondary | ICD-10-CM

## 2019-03-17 DIAGNOSIS — G894 Chronic pain syndrome: Secondary | ICD-10-CM

## 2019-03-17 NOTE — Progress Notes (Signed)
Virtual Visit via Telephone Note  I connected with Jean Gomez on 03/17/19 at  9:00 AM EDT by telephone and verified that I am speaking with the correct person using two identifiers.   I discussed the limitations, risks, security and privacy concerns of performing an evaluation and management service by telephone and the availability of in person appointments. I also discussed with the patient that there may be a patient responsible charge related to this service. The patient expressed understanding and agreed to proceed.   History of Present Illness: She has low back pain that she continues to rate greater than a 10/10. She is SP a Left lumbar facet RFA on 01/30/19. She admits that she continues to have increased pain with soreness at the injection site. She has weakness in her legs. She is having to use the walker more.  She has been having to take all her medication on schedule in order to manage her pain. She feels like the pain is all the way to the bone.     Observations/Objective: 63 year-old female with multiple areas of Chronic pain is SP L=L-Fct RFA  6 weeks ago continues to have pain with weakness. This is part of the recovery with some patients. She is hopeful that once she gets over this that she will experience more pain relief in her lower back.   Assessment and Plan:  Persistent Lumbar Spondylosis  Persistent lower extremity pain Persistent Chronic Pain Syndrome   Follow Up Instructions: 3 month follow up medication management   I discussed the assessment and treatment plan with the patient. The patient was provided an opportunity to ask questions and all were answered. The patient agreed with the plan and demonstrated an understanding of the instructions.   The patient was advised to call back or seek an in-person evaluation if the symptoms worsen or if the condition fails to improve as anticipated.  I provided 18 minutes of non-face-to-face time during this  encounter.   Thad Ranger, NP

## 2019-03-18 MED ORDER — LIDOCAINE 5 % EX OINT: 1.0000 "application " | TOPICAL_OINTMENT | Freq: Four times a day (QID) | CUTANEOUS | 2 refills | Status: DC | PRN

## 2019-03-18 MED ORDER — HYDROCODONE-ACETAMINOPHEN 10-325 MG PO TABS: 1.0000 | ORAL_TABLET | Freq: Four times a day (QID) | ORAL | 0 refills | Status: DC | PRN

## 2019-03-18 MED ORDER — TIZANIDINE HCL 4 MG PO TABS: 4.0000 mg | ORAL_TABLET | Freq: Three times a day (TID) | ORAL | 2 refills | Status: DC

## 2019-04-08 ENCOUNTER — Other Ambulatory Visit: Payer: Self-pay | Admitting: Internal Medicine

## 2019-04-08 DIAGNOSIS — Z1231 Encounter for screening mammogram for malignant neoplasm of breast: Secondary | ICD-10-CM

## 2019-05-02 IMAGING — CR DG SHOULDER 2+V*R*
3 series · 3 of 3 positions shown · non-contrast
Comparison: None.

CLINICAL DATA: Chronic shoulder pain.

EXAM:
RIGHT SHOULDER - 2+ VIEW

[shoulder grashey]
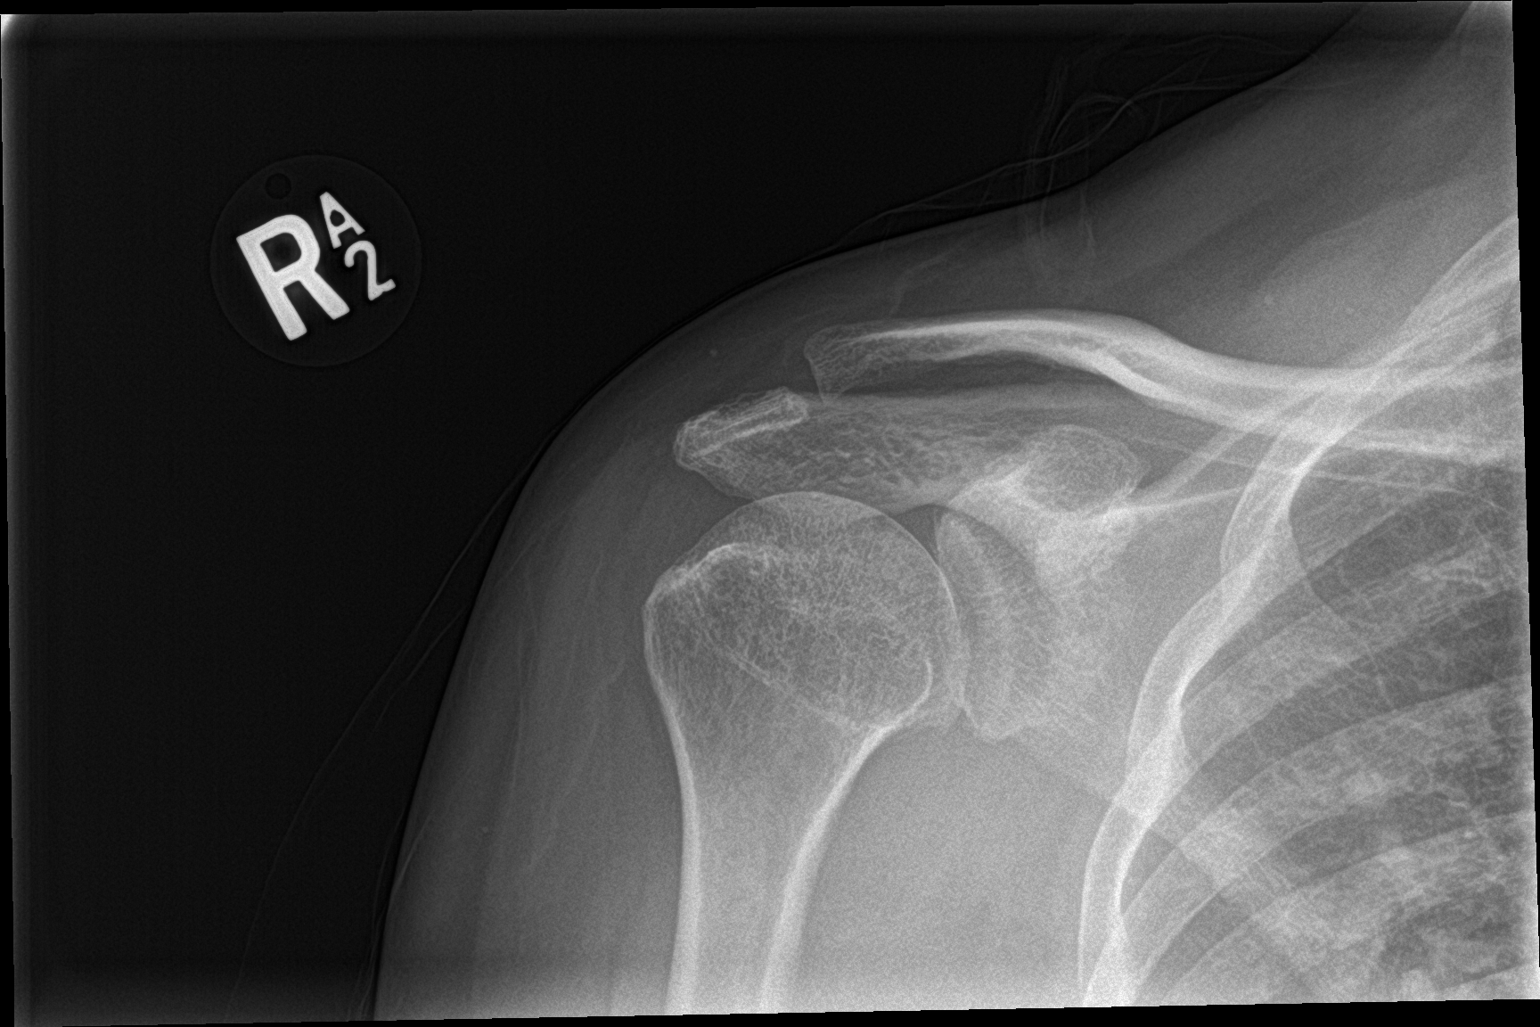

[shoulder y view]
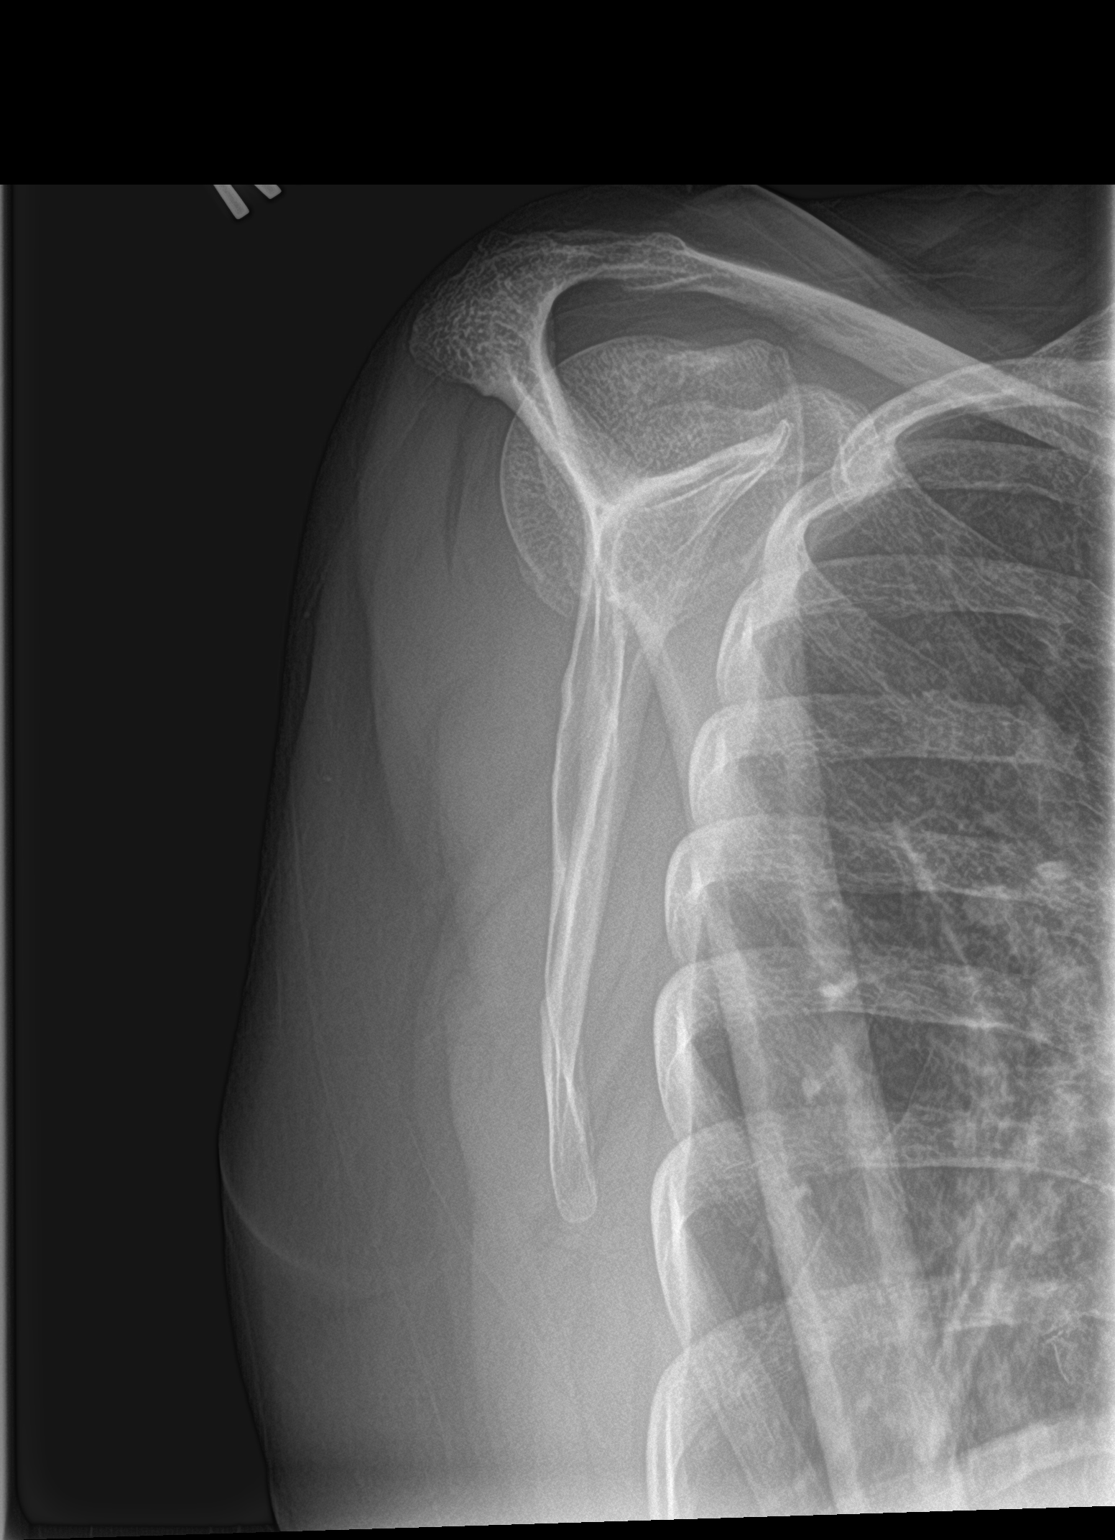

[shoulder axillary]
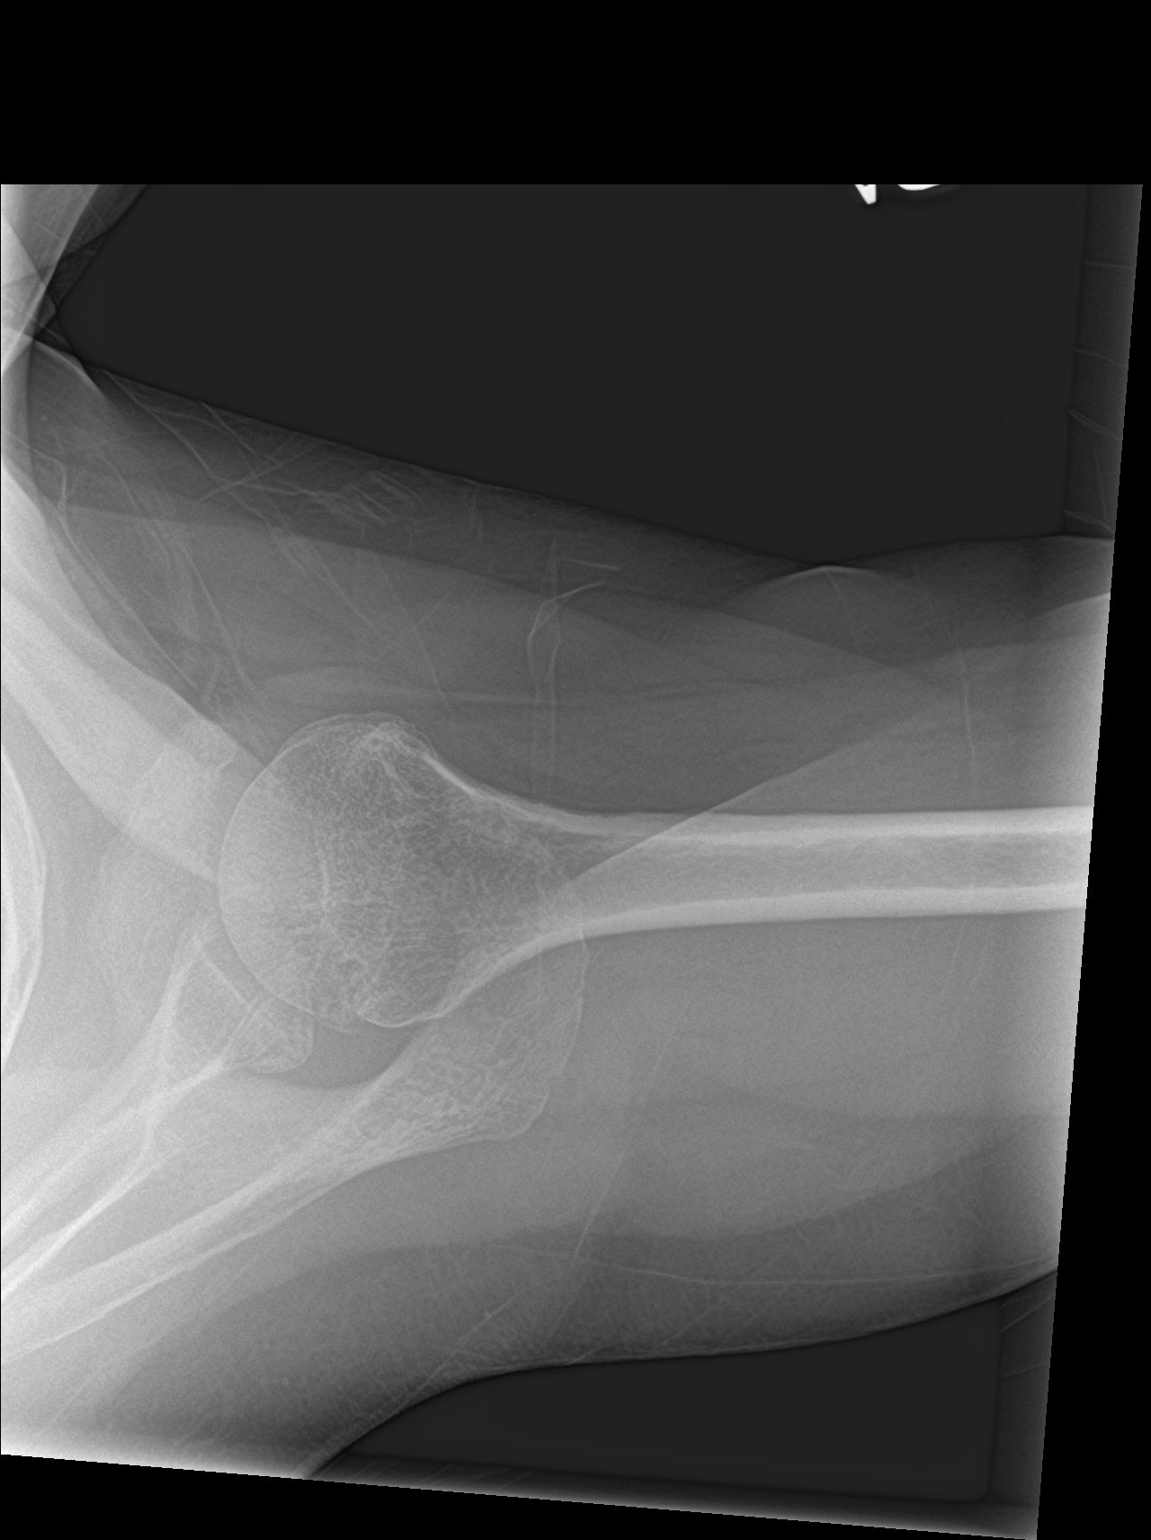

[3 of 3 positions shown; findings below may reference images not displayed]

FINDINGS: There is no evidence of fracture or dislocation. There is no
evidence of arthropathy or other focal bone abnormality. Soft
tissues are unremarkable.
IMPRESSION: Negative.

## 2019-05-02 IMAGING — CR DG SI JOINTS 3+V
4 series · 4 of 4 positions shown · non-contrast
Comparison: None.

CLINICAL DATA: Chronic low back pain.

EXAM:
BILATERAL SACROILIAC JOINTS - 3+ VIEW

[si joints ap]
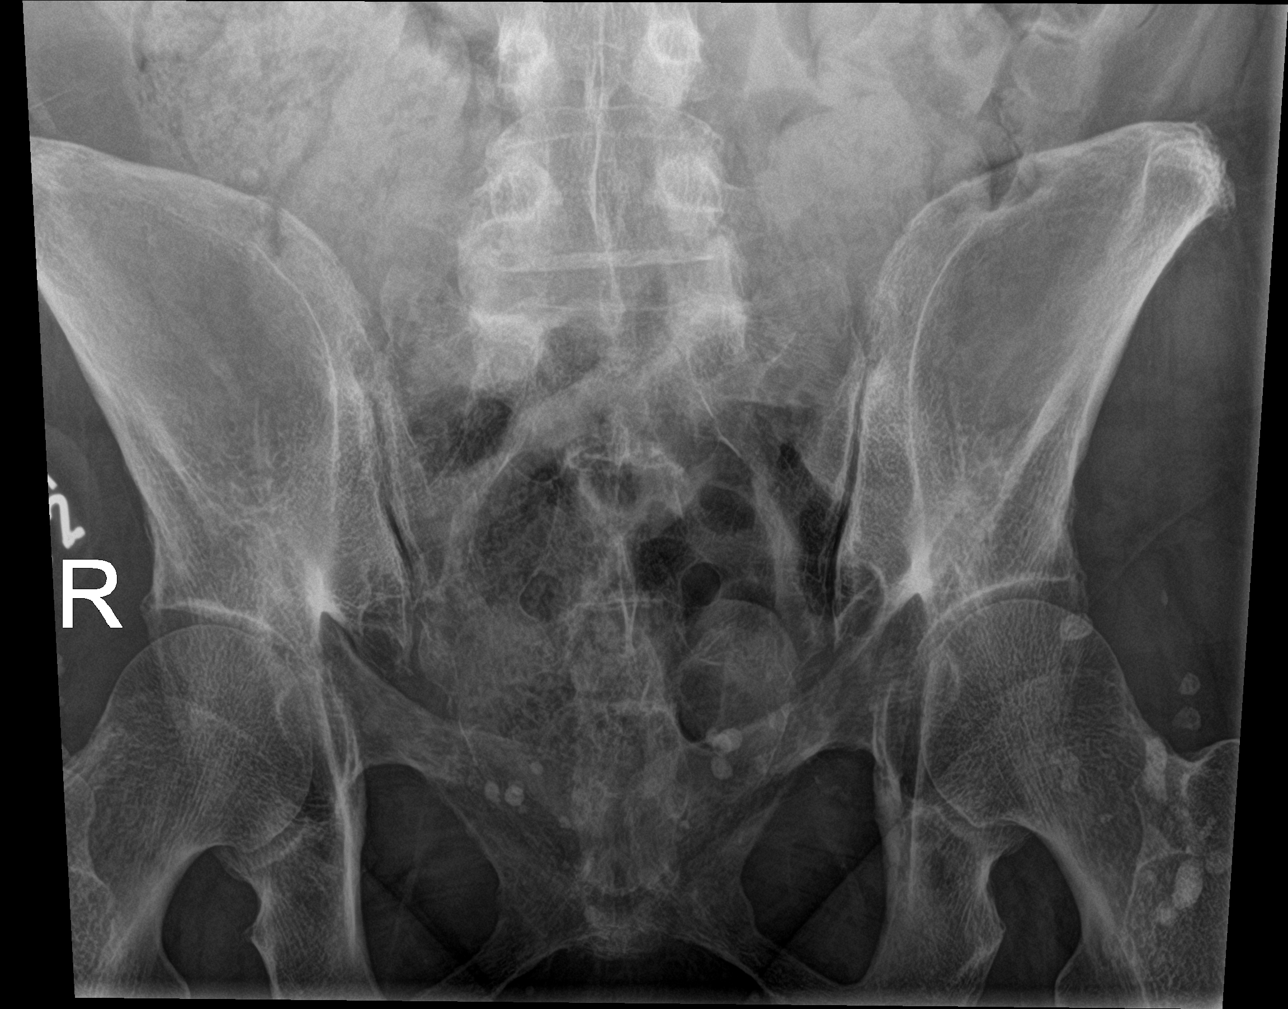

[si joints obl (1 of 3)]
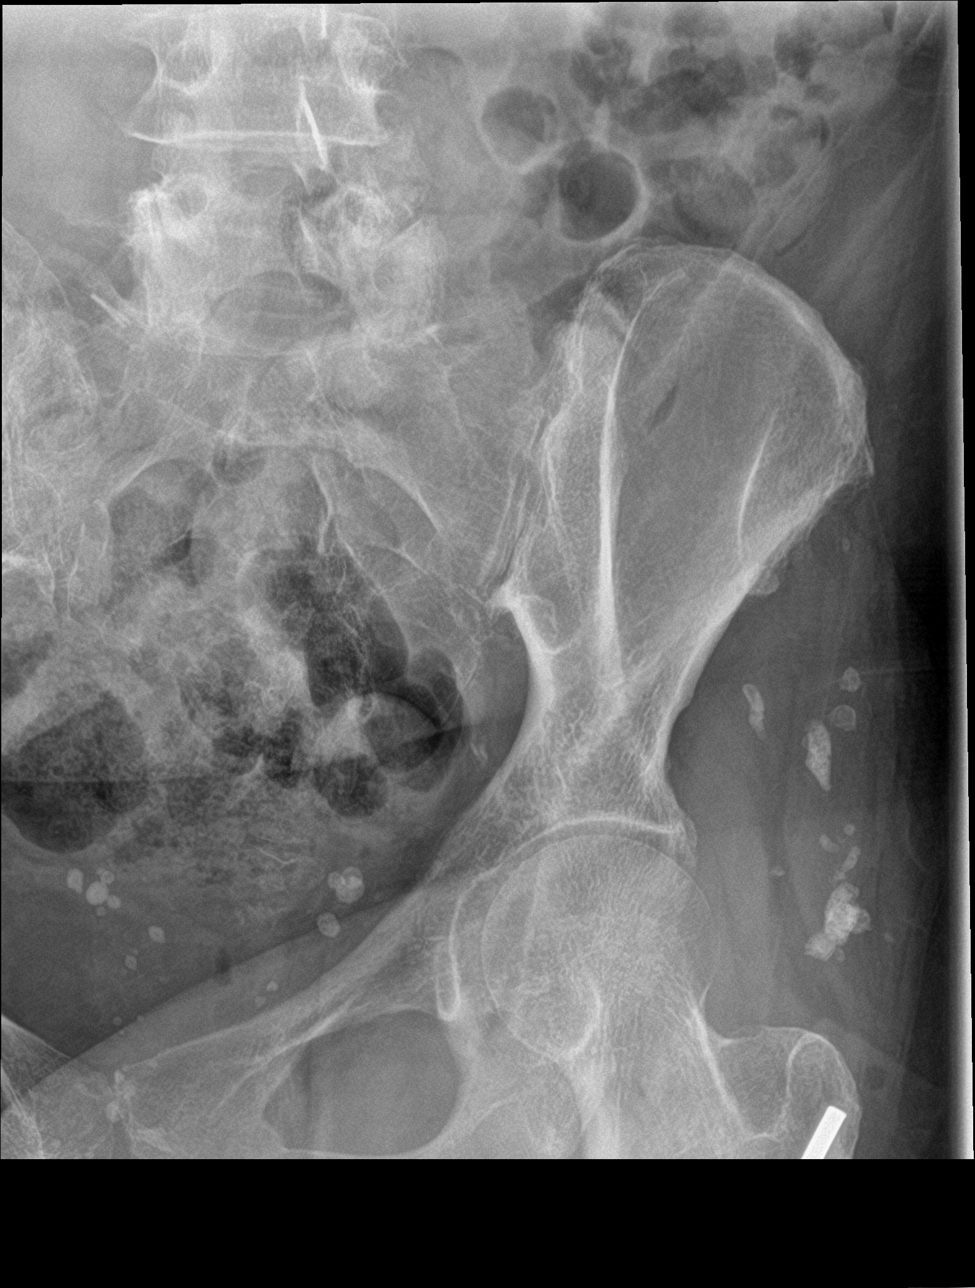

[si joints obl (2 of 3)]
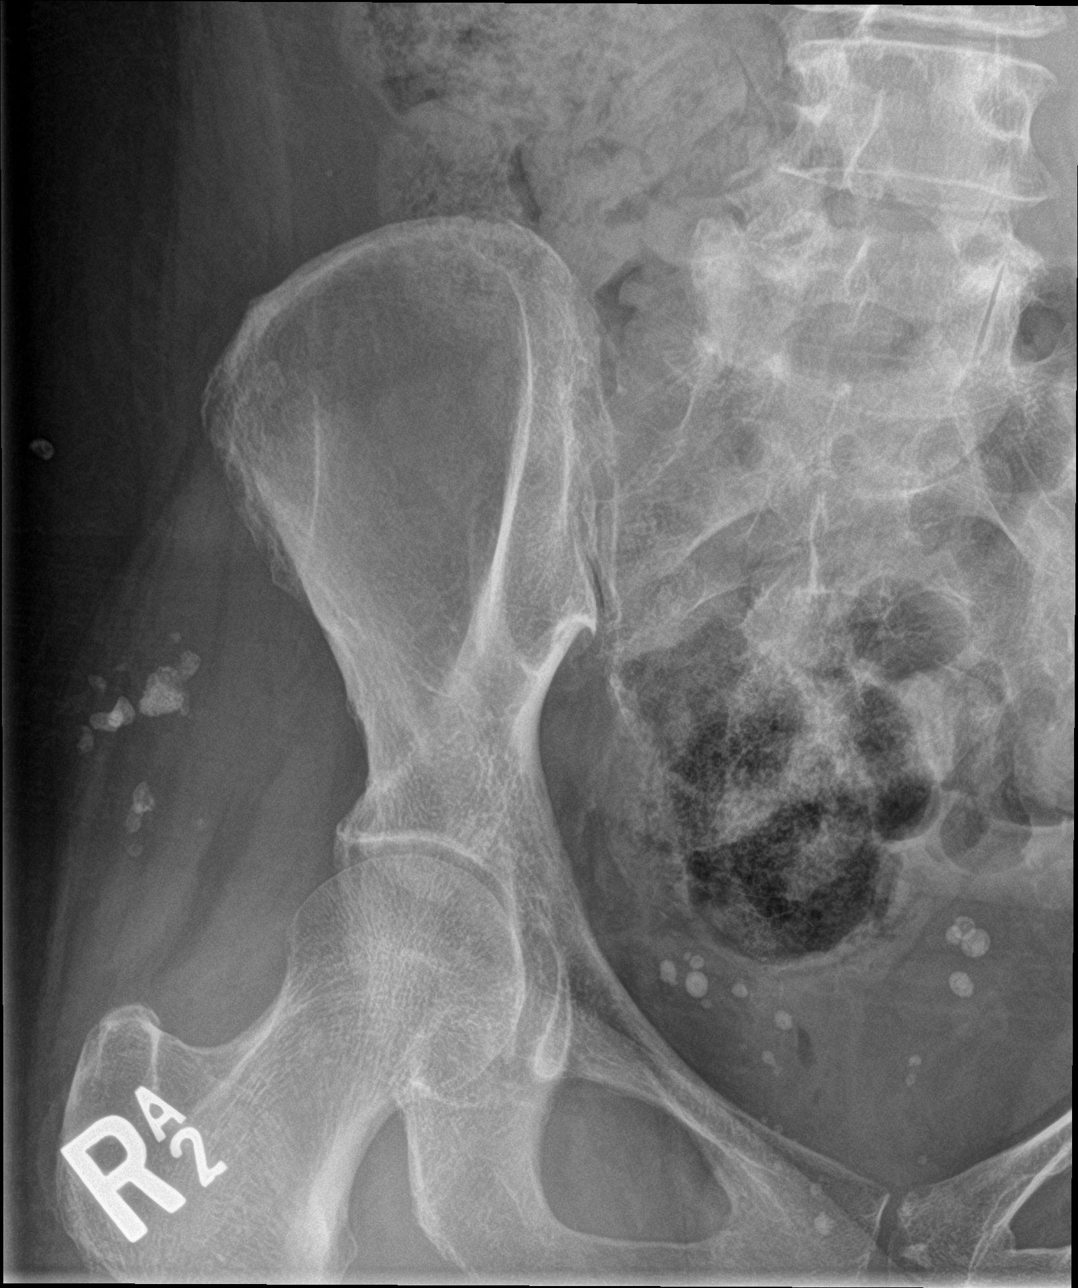

[si joints obl (3 of 3)]
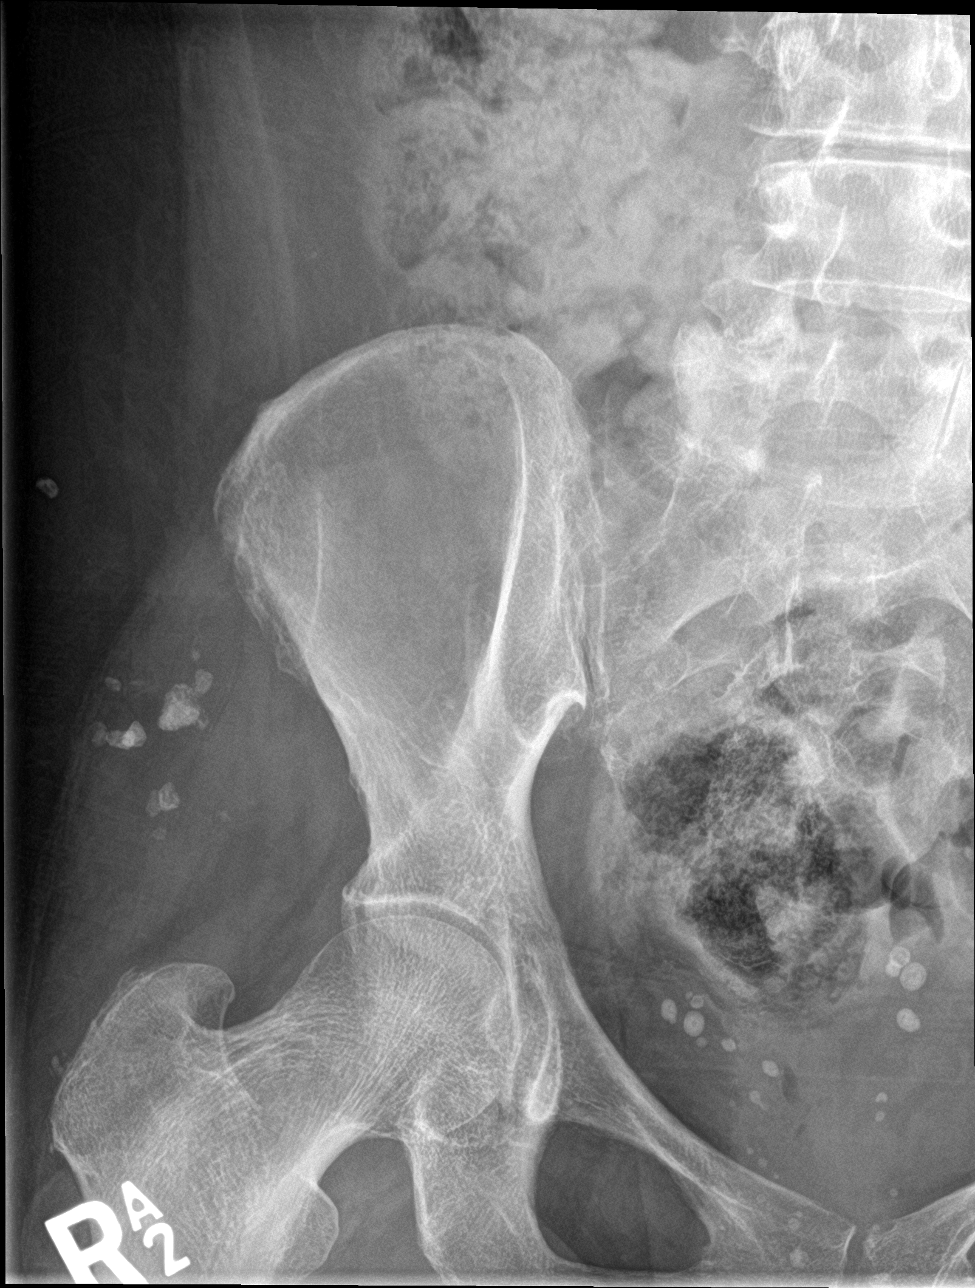

[4 of 4 positions shown; findings below may reference images not displayed]

FINDINGS: The sacroiliac joint spaces are maintained and there is no evidence
of arthropathy. No other bone abnormalities are seen.
IMPRESSION: Negative.

## 2019-06-12 ENCOUNTER — Encounter: Payer: Self-pay | Admitting: Pain Medicine

## 2019-06-16 ENCOUNTER — Encounter: Payer: Self-pay | Admitting: Pain Medicine

## 2019-06-16 ENCOUNTER — Other Ambulatory Visit: Payer: Self-pay

## 2019-06-16 ENCOUNTER — Telehealth: Payer: Self-pay

## 2019-06-16 ENCOUNTER — Ambulatory Visit: Payer: Medicare Other | Attending: Nurse Practitioner | Admitting: Pain Medicine

## 2019-06-16 DIAGNOSIS — M5441 Lumbago with sciatica, right side: Secondary | ICD-10-CM

## 2019-06-16 DIAGNOSIS — M25552 Pain in left hip: Secondary | ICD-10-CM

## 2019-06-16 DIAGNOSIS — M25551 Pain in right hip: Secondary | ICD-10-CM

## 2019-06-16 DIAGNOSIS — M25511 Pain in right shoulder: Secondary | ICD-10-CM

## 2019-06-16 DIAGNOSIS — G8929 Other chronic pain: Secondary | ICD-10-CM

## 2019-06-16 DIAGNOSIS — G894 Chronic pain syndrome: Secondary | ICD-10-CM

## 2019-06-16 DIAGNOSIS — M5136 Other intervertebral disc degeneration, lumbar region: Secondary | ICD-10-CM

## 2019-06-16 DIAGNOSIS — M79604 Pain in right leg: Secondary | ICD-10-CM

## 2019-06-16 DIAGNOSIS — M79605 Pain in left leg: Secondary | ICD-10-CM

## 2019-06-16 DIAGNOSIS — M25562 Pain in left knee: Secondary | ICD-10-CM

## 2019-06-16 DIAGNOSIS — M5481 Occipital neuralgia: Secondary | ICD-10-CM

## 2019-06-16 DIAGNOSIS — M25512 Pain in left shoulder: Secondary | ICD-10-CM

## 2019-06-16 DIAGNOSIS — M170 Bilateral primary osteoarthritis of knee: Secondary | ICD-10-CM

## 2019-06-16 DIAGNOSIS — M7918 Myalgia, other site: Secondary | ICD-10-CM

## 2019-06-16 DIAGNOSIS — M25561 Pain in right knee: Secondary | ICD-10-CM

## 2019-06-16 DIAGNOSIS — R209 Unspecified disturbances of skin sensation: Secondary | ICD-10-CM

## 2019-06-16 MED ORDER — HYDROCODONE-ACETAMINOPHEN 10-325 MG PO TABS: 1.0000 | ORAL_TABLET | Freq: Four times a day (QID) | ORAL | 0 refills | Status: DC | PRN

## 2019-06-16 MED ORDER — DICLOFENAC SODIUM 1 % TD GEL: 4.0000 g | Freq: Four times a day (QID) | TRANSDERMAL | 4 refills | Status: DC

## 2019-06-16 MED ORDER — TIZANIDINE HCL 4 MG PO TABS: 4.0000 mg | ORAL_TABLET | Freq: Three times a day (TID) | ORAL | 2 refills | Status: DC

## 2019-06-16 MED ORDER — LIDOCAINE 5 % EX OINT: 1.0000 "application " | TOPICAL_OINTMENT | Freq: Four times a day (QID) | CUTANEOUS | 2 refills | Status: DC | PRN

## 2019-06-16 NOTE — Patient Instructions (Signed)

## 2019-06-16 NOTE — Telephone Encounter (Signed)
Called patient to discuss post procedure results prior to appoinmtne today.  Instructed patient to return call and give [place] this information.

## 2019-06-16 NOTE — Progress Notes (Signed)
Pain Management Virtual Encounter Note - Virtual Visit via Telephone Telehealth (real-time audio visits between healthcare provider and patient).   Patient's Phone No. & Preferred Pharmacy:  [phone] (home); [phone] (mobile); (Preferred) [phone] No e-mail address on record  [facility] [place] - [address] [place] [id number] Phone: [phone] Fax: [phone]    Pre-screening note:  Our staff contacted Jean Gomez and offered her an "in person", "face-to-face" appointment versus a telephone encounter. She indicated preferring the telephone encounter, at this time.   Reason for Virtual Visit: COVID-19*  Social distancing based on CDC and AMA recommendations.   I contacted Jean Gomez on 06/16/2019 via telephone.      I clearly identified myself as Oswaldo DoneFrancisco A Dyshawn Cangelosi, MD. I verified that I was speaking with the correct person using two identifiers (Name: Jean Gomez, and date of birth: 1956-09-27).  Advanced Informed Consent I sought verbal advanced consent from Jean Gomez for virtual visit interactions. I informed Jean Gomez of possible security and privacy concerns, risks, and limitations associated with providing "not-in-person" medical evaluation and management services. I also informed Jean Gomez of the availability of "in-person" appointments. Finally, I informed her that there would be a charge for the virtual visit and that she could be  personally, fully or partially, financially responsible for it. Jean Gomez expressed understanding and agreed to proceed.   Historic Elements   Jean Gomez is a 63 y.o. year old, female patient evaluated today after her last encounter by our practice on 02/17/2019. Jean Gomez  has a past medical history of Acute postoperative pain (01/16/2019), Allergy, Asthma, Cerebral aneurysm, Chest pain (11/01/2016), Chronic migraine, Chronic respiratory failure (HCC), COPD  (chronic obstructive pulmonary disease) (HCC), Hypertension, Osteoarthritis, Pneumonia, Sarcoid, and Seizures (HCC). She also  has a past surgical history that includes Cholecystectomy; Abdominal hysterectomy; and Spinal fusion. Jean Gomez has a current medication list which includes the following prescription(s): albuterol sulfate, amlodipine, aspirin ec, atorvastatin, betamethasone (augmented), citalopram, clotrimazole-betamethasone, diclofenac sodium, diphenhydramine, medical compression stockings, hydrochlorothiazide, ipratropium, levetiracetam, lidocaine, linaclotide, losartan, nitroglycerin, ondansetron, pantoprazole, potassium chloride sa, quetiapine, sucralfate, tizanidine, torsemide, venlafaxine, hydrocodone-acetaminophen, hydrocodone-acetaminophen, and hydrocodone-acetaminophen. She  reports that she has never smoked. She has never used smokeless tobacco. She reports current alcohol use. She reports that she does not use drugs. Jean Gomez is allergic to contrast media [iodinated diagnostic agents]; gabapentin; labetalol; sulfabenzamide; fentanyl; and penicillins.   HPI  Today, she is being contacted for medication management.  The patient indicates that she is still having pain primarily in the right lower extremity going all the way down to the bottom of her foot and what seems to be an S1 dermatomal distribution.  She has a history of chronic low back pain and leg pain and x-rays have demonstrated that she has multilevel degenerative disc disease.  She denies any prior MRIs, but in view of her symptoms and the fact that they have not improved, we will go ahead and order an MRI of the lumbar spine  Pharmacotherapy Assessment  Analgesic: Hydrocodone/acetaminophen 10/325 3 times daily (fill date 11/19/2017) hydrocodone 30 mg per day. MME/day:30mg /day  Monitoring: Pharmacotherapy: No side-effects or adverse reactions reported. [place] PMP: PDMP reviewed during this encounter.       Compliance: No  problems identified. Effectiveness: Clinically acceptable. Plan: Refer to "POC".  Pertinent Labs   SAFETY SCREENING Profile Lab Results  Component Value Date   HIV Non Reactive 12/18/2018   Renal  Function Lab Results  Component Value Date   BUN 15 03/08/2019   CREATININE 0.70 03/08/2019   GFRAA >60 03/08/2019   GFRNONAA >60 03/08/2019   Hepatic Function Lab Results  Component Value Date   AST 19 03/08/2019   ALT 19 03/08/2019   ALBUMIN 4.5 03/08/2019   UDS Summary  Date Value Ref Range Status  03/27/2018 FINAL  Final    Comment:    ==================================================================== TOXASSURE SELECT 13 (MW) ==================================================================== Test                             Result       Flag       Units Drug Present and Declared for Prescription Verification   Hydrocodone                    646          EXPECTED   ng/mg creat   Hydromorphone                  367          EXPECTED   ng/mg creat   Dihydrocodeine                 317          EXPECTED   ng/mg creat   Norhydrocodone                 2063         EXPECTED   ng/mg creat    Sources of hydrocodone include scheduled prescription    medications. Hydromorphone, dihydrocodeine and norhydrocodone are    expected metabolites of hydrocodone. Hydromorphone and    dihydrocodeine are also available as scheduled prescription    medications. ==================================================================== Test                      Result    Flag   Units      Ref Range   Creatinine              24               mg/dL      >=20 ==================================================================== Declared Medications:  The flagging and interpretation on this report are based on the  following declared medications.  Unexpected results may arise from  inaccuracies in the declared medications.  **Note: The testing scope of this panel includes these medications:   Hydrocodone (Hydrocodone-Acetaminophen)  **Note: The testing scope of this panel does not include following  reported medications:  Acetaminophen (Hydrocodone-Acetaminophen)  Albuterol  Amlodipine  Aspirin (Aspirin 81)  Atorvastatin  Budenoside (Symbicort)  Clotrimazole (Lotrisone)  Diclofenac  Escitalopram  Formoterol (Symbicort)  Hydrochlorothiazide (HCTZ)  Ipratropium  Ketoconazole  Lactulose  Levetiracetam  Losartan (Losartan Potassium)  Lubiprostone  Mirtazapine  Nitroglycerin  Ondansetron  Pantoprazole  Potassium  Sucralfate  Tizanidine  Topical  Torsemide  Venlafaxine  Vitamin D2 (Ergocalciferol) ==================================================================== For clinical consultation, please call [phone]. ====================================================================    Note: Above Lab results reviewed.  Recent imaging  DG C-Arm 1-60 Min-No Report Fluoroscopy was utilized by the requesting physician.  No radiographic  interpretation.   Assessment  The primary encounter diagnosis was Chronic lower extremity pain (Tertiary Area of Pain) (Bilateral) (R>L). Diagnoses of Chronic low back pain (Secondary Area of Pain) (Bilateral) (R>L) w/ sciatica (Right), DDD (degenerative disc disease), lumbar, Chronic hip pain (Tertiary Area of Pain) (Bilateral) (  R>L), Chronic knee pain (Fourth Area of Pain) (Bilateral) (R>L), Osteoarthritis of knee (Bilateral) (R>L), Occipital neuralgia (Primary Area of Pain) (Bilateral) (R>L), Chronic shoulder pain (Fifth Area of Pain) (Bilateral) (R>L), Chronic musculoskeletal pain, Disturbance of skin sensation, Chronic pain syndrome, and Other intervertebral disc degeneration, lumbar region were also pertinent to this visit.  Plan of Care  I have discontinued Artemisia F. Manheim's predniSONE, HYDROcodone-acetaminophen, and HYDROcodone-acetaminophen. I have also changed her HYDROcodone-acetaminophen and lidocaine. Additionally,  I am having her start on HYDROcodone-acetaminophen and HYDROcodone-acetaminophen. Lastly, I am having her maintain her Albuterol Sulfate, betamethasone (augmented), ipratropium, nitroGLYCERIN, aspirin EC, amLODipine, hydrochlorothiazide, losartan, pantoprazole, sucralfate, potassium chloride SA, clotrimazole-betamethasone, venlafaxine, levETIRAcetam, atorvastatin, linaclotide, citalopram, ondansetron, diphenhydrAMINE, torsemide, Medical Compression Stockings, QUEtiapine, tiZANidine, and diclofenac sodium.  Pharmacotherapy (Medications Ordered): Meds ordered this encounter  Medications  . tiZANidine (ZANAFLEX) 4 MG tablet    Sig: Take 1 tablet (4 mg total) by mouth 3 (three) times daily.    Dispense:  90 tablet    Refill:  2    Fill one day early if pharmacy is closed on scheduled refill date. May substitute for generic if available.  Marland Kitchen. HYDROcodone-acetaminophen (NORCO) 10-325 MG tablet    Sig: Take 1 tablet by mouth every 6 (six) hours as needed for up to 30 days for severe pain. Must last 30 days    Dispense:  120 tablet    Refill:  0    Chronic Pain: STOP Act (Not applicable) Fill 1 day early if closed on refill date. Do not fill until: 06/18/2019. To last until: 07/18/2019. Avoid benzodiazepines within 8 hours of opioids  . HYDROcodone-acetaminophen (NORCO) 10-325 MG tablet    Sig: Take 1 tablet by mouth every 6 (six) hours as needed for up to 30 days for severe pain. Must last 30 days    Dispense:  120 tablet    Refill:  0    Chronic Pain: STOP Act (Not applicable) Fill 1 day early if closed on refill date. Do not fill until: 07/18/2019. To last until: 08/17/2019. Avoid benzodiazepines within 8 hours of opioids  . HYDROcodone-acetaminophen (NORCO) 10-325 MG tablet    Sig: Take 1 tablet by mouth every 6 (six) hours as needed for up to 30 days for severe pain. Must last 30 days    Dispense:  120 tablet    Refill:  0    Chronic Pain: STOP Act (Not applicable) Fill 1 day early if closed on refill  date. Do not fill until: 08/17/2019. To last until: 09/16/2019. Avoid benzodiazepines within 8 hours of opioids  . diclofenac sodium (VOLTAREN) 1 % GEL    Sig: Apply 4 g topically 4 (four) times daily.    Dispense:  350 g    Refill:  4    Fill one day early if pharmacy is closed on scheduled refill date. May substitute for generic if available.  . lidocaine (XYLOCAINE) 5 % ointment    Sig: Apply 1 application topically 4 (four) times daily as needed for moderate pain. 1 application = 6 in. of ointment = 5 g (Max: 20 g/day)    Dispense:  35.44 g    Refill:  2    Please instruct patient on correct application. Fill one day early if pharmacy is closed on scheduled refill date. May substitute for generic if available.   Orders:  Orders Placed This Encounter  Procedures  . Caudal Epidural Injection    Standing Status:   Future    Standing Expiration Date:  07/16/2019    Scheduling Instructions:     Laterality: Right-sided     Level(s): Sacrococcygeal canal (Tailbone area)     Sedation: Patient's choice     Scheduling Timeframe: As soon as pre-approved    Order Specific Question:   Where will this procedure be performed?    Answer:   [facility]  . MR LUMBAR SPINE WO CONTRAST    In addition to any acute findings, please report on degenerative changes related to: (Please specify level(s)) (1) ROM & instability (>114mm displacement) (2) Facet joint (Zygoapophyseal Joint) (3) DDD and/or IVDD (4) Pars defects (5) Previous surgical changes (Include description of hardware and hardware status, if present) (6) Presence and degree of spondylolisthesis, spondylosis, and/or spondyloarthropathies)  (7) Old Fractures (8) Demineralization (9) Additional bone pathology (10) Stenosis (Central, Lateral Recess, Foraminal) (11) If at all possible, please provide AP diameter (mm) of foraminal and/or central canal.    Standing Status:   Future    Standing Expiration Date:   09/16/2019    Order  Specific Question:   What is the patient's sedation requirement?    Answer:   No Sedation    Order Specific Question:   Does the patient have a pacemaker or implanted devices?    Answer:   No    Order Specific Question:   Preferred imaging location?    Answer:   [facility] (table limit-350lbs)    Order Specific Question:   Call Results- Best Contact Number?    Answer:   [phone] [facility])    Order Specific Question:   Radiology Contrast Protocol - do NOT remove file path    Answer:   [email or web address]   Follow-up plan:   Return in about 3 months (around 09/03/2019) for (VV), E/M (MM), in addition, Procedure (w/ sedation): (R) Caudal ESI #1.    Recent Visits No visits were found meeting these conditions.  Showing recent visits within past 90 days and meeting all other requirements   Today's Visits Date Type Provider Dept  06/16/19 Office Visit Delano MetzNaveira, Raad Clayson, MD [facility]  Showing today's visits and meeting all other requirements   Future Appointments No visits were found meeting these conditions.  Showing future appointments within next 90 days and meeting all other requirements   I discussed the assessment and treatment plan with the patient. The patient was provided an opportunity to ask questions and all were answered. The patient agreed with the plan and demonstrated an understanding of the instructions.  Patient advised to call back or seek an in-person evaluation if the symptoms or condition worsens.  Total duration of non-face-to-face encounter: 15 minutes.  Note by: Oswaldo DoneFrancisco A Mirha Brucato, MD Date: 06/16/2019; Time: 10:18 AM  Note: This dictation was prepared with Dragon dictation. Any transcriptional errors that may result from this process are unintentional.  Disclaimer:  * Given the special circumstances of the COVID-19 pandemic, the federal government has announced that the Office for Civil Rights (OCR)  will exercise its enforcement discretion and will not impose penalties on physicians using telehealth in the event of noncompliance with regulatory requirements under the [employer] Insurance Portability and Accountability Act (HIPAA) in connection with the good faith provision of telehealth during the COVID-19 national public health emergency. (AMA)

## 2019-06-17 ENCOUNTER — Encounter: Payer: Self-pay | Admitting: Nurse Practitioner

## 2019-06-20 ENCOUNTER — Other Ambulatory Visit: Payer: Self-pay

## 2019-06-20 ENCOUNTER — Other Ambulatory Visit
Admission: RE | Admit: 2019-06-20 | Discharge: 2019-06-20 | Disposition: A | Discharge status: Home / Self Care | Payer: Medicare Other | Source: Ambulatory Visit | Attending: Pain Medicine | Admitting: Pain Medicine

## 2019-06-20 DIAGNOSIS — Z01812 Encounter for preprocedural laboratory examination: Secondary | ICD-10-CM | POA: Diagnosis present

## 2019-06-20 DIAGNOSIS — Z1159 Encounter for screening for other viral diseases: Secondary | ICD-10-CM | POA: Insufficient documentation

## 2019-06-21 LAB — SARS CORONAVIRUS 2 (TAT 6-24 HRS): SARS Coronavirus 2: NEGATIVE

## 2019-06-24 ENCOUNTER — Other Ambulatory Visit: Payer: Self-pay

## 2019-06-24 ENCOUNTER — Ambulatory Visit
Admission: RE | Admit: 2019-06-24 | Discharge: 2019-06-24 | Disposition: A | Discharge status: Home / Self Care | Payer: Medicare Other | Source: Ambulatory Visit | Attending: Pain Medicine | Admitting: Pain Medicine

## 2019-06-24 ENCOUNTER — Encounter: Payer: Self-pay | Admitting: Pain Medicine

## 2019-06-24 ENCOUNTER — Ambulatory Visit (HOSPITAL_BASED_OUTPATIENT_CLINIC_OR_DEPARTMENT_OTHER): Payer: Medicare Other | Admitting: Pain Medicine

## 2019-06-24 VITALS — BP 144/91 | HR 92 | Temp 98.1°F | Resp 16 | Ht 63.0 in | Wt 160.0 lb

## 2019-06-24 DIAGNOSIS — M5441 Lumbago with sciatica, right side: Secondary | ICD-10-CM | POA: Diagnosis present

## 2019-06-24 DIAGNOSIS — M5416 Radiculopathy, lumbar region: Secondary | ICD-10-CM

## 2019-06-24 DIAGNOSIS — Z91041 Radiographic dye allergy status: Secondary | ICD-10-CM | POA: Diagnosis present

## 2019-06-24 DIAGNOSIS — G8929 Other chronic pain: Secondary | ICD-10-CM | POA: Diagnosis present

## 2019-06-24 DIAGNOSIS — M79604 Pain in right leg: Secondary | ICD-10-CM

## 2019-06-24 DIAGNOSIS — M79605 Pain in left leg: Secondary | ICD-10-CM | POA: Insufficient documentation

## 2019-06-24 DIAGNOSIS — M5137 Other intervertebral disc degeneration, lumbosacral region: Secondary | ICD-10-CM

## 2019-06-24 DIAGNOSIS — M47816 Spondylosis without myelopathy or radiculopathy, lumbar region: Secondary | ICD-10-CM | POA: Insufficient documentation

## 2019-06-24 MED ORDER — ROPIVACAINE HCL 2 MG/ML IJ SOLN
2.0000 mL | Freq: Once | INTRAMUSCULAR | Status: AC
  Administered 2019-06-24: 13:00:00 2 mL via EPIDURAL
  Filled 2019-06-24: qty 10

## 2019-06-24 MED ORDER — LACTATED RINGERS IV SOLN: 1000.0000 mL | Freq: Once | INTRAVENOUS | Status: DC

## 2019-06-24 MED ORDER — DIPHENHYDRAMINE HCL 50 MG/ML IJ SOLN
25.0000 mg | Freq: Once | INTRAMUSCULAR | Status: AC
  Administered 2019-06-24: 25 mg via INTRAVENOUS
  Filled 2019-06-24: qty 1

## 2019-06-24 MED ORDER — MIDAZOLAM HCL 5 MG/5ML IJ SOLN
1.0000 mg | INTRAMUSCULAR | Status: DC | PRN
  Administered 2019-06-24: 2 mg via INTRAVENOUS
  Filled 2019-06-24: qty 5

## 2019-06-24 MED ORDER — LIDOCAINE HCL 2 % IJ SOLN
20.0000 mL | Freq: Once | INTRAMUSCULAR | Status: AC
  Administered 2019-06-24: 13:00:00 400 mg
  Filled 2019-06-24: qty 20

## 2019-06-24 MED ORDER — SODIUM CHLORIDE 0.9% FLUSH
2.0000 mL | Freq: Once | INTRAVENOUS | Status: AC
  Administered 2019-06-24: 2 mL

## 2019-06-24 MED ORDER — TRIAMCINOLONE ACETONIDE 40 MG/ML IJ SUSP
40.0000 mg | Freq: Once | INTRAMUSCULAR | Status: AC
  Administered 2019-06-24: 13:00:00 40 mg
  Filled 2019-06-24: qty 1

## 2019-06-24 MED ORDER — SODIUM CHLORIDE (PF) 0.9 % IJ SOLN
INTRAMUSCULAR | Status: AC
  Filled 2019-06-24: qty 10

## 2019-06-24 NOTE — Patient Instructions (Addendum)
____________________________________________________________________________________________  Post-Procedure Discharge Instructions  Instructions:  Apply ice:   Purpose: This will minimize any swelling and discomfort after procedure.   When: Day of procedure, as soon as you get home.  How: Fill a plastic sandwich bag with crushed ice. Cover it with a small towel and apply to injection site.  How long: (15 min on, 15 min off) Apply for 15 minutes then remove x 15 minutes.  Repeat sequence on day of procedure, until you go to bed.  Apply heat:   Purpose: To treat any soreness and discomfort from the procedure.  When: Starting the next day after the procedure.  How: Apply heat to procedure site starting the day following the procedure.  How long: May continue to repeat daily, until discomfort goes away.  Food intake: Start with clear liquids (like water) and advance to regular food, as tolerated.   Physical activities: Keep activities to a minimum for the first 8 hours after the procedure. After that, then as tolerated.  Driving: If you have received any sedation, be responsible and do not drive. You are not allowed to drive for 24 hours after having sedation.  Blood thinner: (Applies only to those taking blood thinners) You may restart your blood thinner 6 hours after your procedure.  Insulin: (Applies only to Diabetic patients taking insulin) As soon as you can eat, you may resume your normal dosing schedule.  Infection prevention: Keep procedure site clean and dry. Shower daily and clean area with soap and water.  Post-procedure Pain Diary: Extremely important that this be done correctly and accurately. Recorded information will be used to determine the next step in treatment. For the purpose of accuracy, follow these rules:  Evaluate only the area treated. Do not report or include pain from an untreated area. For the purpose of this evaluation, ignore all other areas of pain,  except for the treated area.  After your procedure, avoid taking a long nap and attempting to complete the pain diary after you wake up. Instead, set your alarm clock to go off every hour, on the hour, for the initial 8 hours after the procedure. Document the duration of the numbing medicine, and the relief you are getting from it.  Do not go to sleep and attempt to complete it later. It will not be accurate. If you received sedation, it is likely that you were given a medication that may cause amnesia. Because of this, completing the diary at a later time may cause the information to be inaccurate. This information is needed to plan your care.  Follow-up appointment: Keep your post-procedure follow-up evaluation appointment after the procedure (usually 2 weeks for most procedures, 6 weeks for radiofrequencies). DO NOT FORGET to bring you pain diary with you.   Expect: (What should I expect to see with my procedure?)  From numbing medicine (AKA: Local Anesthetics): Numbness or decrease in pain. You may also experience some weakness, which if present, could last for the duration of the local anesthetic.  Onset: Full effect within 15 minutes of injected.  Duration: It will depend on the type of local anesthetic used. On the average, 1 to 8 hours.   From steroids (Applies only if steroids were used): Decrease in swelling or inflammation. Once inflammation is improved, relief of the pain will follow.  Onset of benefits: Depends on the amount of swelling present. The more swelling, the longer it will take for the benefits to be seen. In some cases, up to 10 days.    Duration: Steroids will stay in the system x 2 weeks. Duration of benefits will depend on multiple posibilities including persistent irritating factors.  Side-effects: If present, they may typically last 2 weeks (the duration of the steroids).  Frequent: Cramps (if they occur, drink Gatorade and take over-the-counter Magnesium 450-500 mg  once to twice a day); water retention with temporary weight gain; increases in blood sugar; decreased immune system response; increased appetite.  Occasional: Facial flushing (red, warm cheeks); mood swings; menstrual changes.  Uncommon: Long-term decrease or suppression of natural hormones; bone thinning. (These are more common with higher doses or more frequent use. This is why we prefer that our patients avoid having any injection therapies in other practices.)   Very Rare: Severe mood changes; psychosis; aseptic necrosis.  From procedure: Some discomfort is to be expected once the numbing medicine wears off. This should be minimal if ice and heat are applied as instructed.  Call if: (When should I call?)  You experience numbness and weakness that gets worse with time, as opposed to wearing off.  New onset bowel or bladder incontinence. (Applies only to procedures done in the spine)  Emergency Numbers:  Durning business hours (Monday - Thursday, 8:00 AM - 4:00 PM) (Friday, 9:00 AM - 12:00 Noon): [phone]  After hours: [phone]  NOTE: If you are having a problem and are unable connect with, or to talk to a provider, then go to your nearest urgent care or emergency department. If the problem is serious and urgent, please call 911. ____________________________________________________________________________________________    ______________________________________________________________________________________________  Specialty Pain Scale  Introduction:  There are significant differences in how pain is reported. The word pain usually refers to physical pain, but it is also a common synonym of suffering. The medical community uses a scale from 0 (zero) to 10 (ten) to report pain level. Zero (0) is described as "no pain", while ten (10) is described as "the worse pain you can imagine". The problem with this scale is that physical pain is reported along with suffering.  Suffering refers to mental pain, or more often yet it refers to any unpleasant feeling, emotion or aversion associated with the perception of harm or threat of harm. It is the psychological component of pain.  Pain Specialists prefer to separate the two components. The pain scale used by this practice is the Verbal Numerical Rating Scale (VNRS-11). This scale is for the physical pain only. DO NOT INCLUDE how your pain psychologically affects you. This scale is for adults 21 years of age and older. It has 11 (eleven) levels. The 1st level is 0/10. This means: "right now, I have no pain". In the context of pain management, it also means: "right now, my physical pain is under control with the current therapy".  General Information:  The scale should reflect your current level of pain. Unless you are specifically asked for the level of your worst pain, or your average pain. If you are asked for one of these two, then it should be understood that it is over the past 24 hours.  Levels 1 (one) through 5 (five) are described below, and can be treated as an outpatient. Ambulatory pain management facilities such as ours are more than adequate to treat these levels. Levels 6 (six) through 10 (ten) are also described below, however, these must be treated as a hospitalized patient. While levels 6 (six) and 7 (seven) may be evaluated at an urgent care facility, levels 8 (eight) through 10 (ten)   constitute medical emergencies and as such, they belong in a hospital's emergency department. When having these levels (as described below), do not come to our office. Our facility is not equipped to manage these levels. Go directly to an urgent care facility or an emergency department to be evaluated.  Definitions:  Activities of Daily Living (ADL): Activities of daily living (ADL or ADLs) is a term used in healthcare to refer to people's daily self-care activities. Health professionals often use a person's ability or inability  to perform ADLs as a measurement of their functional status, particularly in regard to people post injury, with disabilities and the elderly. There are two ADL levels: Basic and Instrumental. Basic Activities of Daily Living (BADL  or BADLs) consist of self-care tasks that include: Bathing and showering; personal hygiene and grooming (including brushing/combing/styling hair); dressing; Toilet hygiene (getting to the toilet, cleaning oneself, and getting back up); eating and self-feeding (not including cooking or chewing and swallowing); functional mobility, often referred to as "transferring", as measured by the ability to walk, get in and out of bed, and get into and out of a chair; the broader definition (moving from one place to another while performing activities) is useful for people with different physical abilities who are still able to get around independently. Basic ADLs include the things many people do when they get up in the morning and get ready to go out of the house: get out of bed, go to the toilet, bathe, dress, groom, and eat. On the average, loss of function typically follows a particular order. Hygiene is the first to go, followed by loss of toilet use and locomotion. The last to go is the ability to eat. When there is only one remaining area in which the person is independent, there is a 62.9% chance that it is eating and only a 3.5% chance that it is hygiene. Instrumental Activities of Daily Living (IADL or IADLs) are not necessary for fundamental functioning, but they let an individual live independently in a community. IADL consist of tasks that include: cleaning and maintaining the house; home establishment and maintenance; care of others (including selecting and supervising caregivers); care of pets; child rearing; managing money; managing financials (investments, etc.); meal preparation and cleanup; shopping for groceries and necessities; moving within the community; safety procedures  and emergency responses; health management and maintenance (taking prescribed medications); and using the telephone or other form of communication.  Instructions:  Most patients tend to report their pain as a combination of two factors, their physical pain and their psychosocial pain. This last one is also known as "suffering" and it is reflection of how physical pain affects you socially and psychologically. From now on, report them separately.  From this point on, when asked to report your pain level, report only your physical pain. Use the following table for reference.  Pain Clinic Pain Levels (0-5/10)  Pain Level Score  Description  No Pain 0   Mild pain 1 Nagging, annoying, but does not interfere with basic activities of daily living (ADL). Patients are able to eat, bathe, get dressed, toileting (being able to get on and off the toilet and perform personal hygiene functions), transfer (move in and out of bed or a chair without assistance), and maintain continence (able to control bladder and bowel functions). Blood pressure and heart rate are unaffected. A normal heart rate for a healthy adult ranges from 60 to 100 bpm (beats per minute).   Mild to moderate pain 2 Noticeable   and distracting. Impossible to hide from other people. More frequent flare-ups. Still possible to adapt and function close to normal. It can be very annoying and may have occasional stronger flare-ups. With discipline, patients may get used to it and adapt.   Moderate pain 3 Interferes significantly with activities of daily living (ADL). It becomes difficult to feed, bathe, get dressed, get on and off the toilet or to perform personal hygiene functions. Difficult to get in and out of bed or a chair without assistance. Very distracting. With effort, it can be ignored when deeply involved in activities.   Moderately severe pain 4 Impossible to ignore for more than a few minutes. With effort, patients may still be able to  manage work or participate in some social activities. Very difficult to concentrate. Signs of autonomic nervous system discharge are evident: dilated pupils (mydriasis); mild sweating (diaphoresis); sleep interference. Heart rate becomes elevated (>115 bpm). Diastolic blood pressure (lower number) rises above 100 mmHg. Patients find relief in laying down and not moving.   Severe pain 5 Intense and extremely unpleasant. Associated with frowning face and frequent crying. Pain overwhelms the senses.  Ability to do any activity or maintain social relationships becomes significantly limited. Conversation becomes difficult. Pacing back and forth is common, as getting into a comfortable position is nearly impossible. Pain wakes you up from deep sleep. Physical signs will be obvious: pupillary dilation; increased sweating; goosebumps; brisk reflexes; cold, clammy hands and feet; nausea, vomiting or dry heaves; loss of appetite; significant sleep disturbance with inability to fall asleep or to remain asleep. When persistent, significant weight loss is observed due to the complete loss of appetite and sleep deprivation.  Blood pressure and heart rate becomes significantly elevated. Caution: If elevated blood pressure triggers a pounding headache associated with blurred vision, then the patient should immediately seek attention at an urgent or emergency care unit, as these may be signs of an impending stroke.    Emergency Department Pain Levels (6-10/10)  Emergency Room Pain 6 Severely limiting. Requires emergency care and should not be seen or managed at an outpatient pain management facility. Communication becomes difficult and requires great effort. Assistance to reach the emergency department may be required. Facial flushing and profuse sweating along with potentially dangerous increases in heart rate and blood pressure will be evident.   Distressing pain 7 Self-care is very difficult. Assistance is required to  transport, or use restroom. Assistance to reach the emergency department will be required. Tasks requiring coordination, such as bathing and getting dressed become very difficult.   Disabling pain 8 Self-care is no longer possible. At this level, pain is disabling. The individual is unable to do even the most "basic" activities such as walking, eating, bathing, dressing, transferring to a bed, or toileting. Fine motor skills are lost. It is difficult to think clearly.   Incapacitating pain 9 Pain becomes incapacitating. Thought processing is no longer possible. Difficult to remember your own name. Control of movement and coordination are lost.   The worst pain imaginable 10 At this level, most patients pass out from pain. When this level is reached, collapse of the autonomic nervous system occurs, leading to a sudden drop in blood pressure and heart rate. This in turn results in a temporary and dramatic drop in blood flow to the brain, leading to a loss of consciousness. Fainting is one of the body's self defense mechanisms. Passing out puts the brain in a calmed state and causes it to shut down   for a while, in order to begin the healing process.    Summary: 1.   Refer to this scale when providing us with your pain level. 2.   Be accurate and careful when reporting your pain level. This will help with your care. 3.   Over-reporting your pain level will lead to loss of credibility. 4.   Even a level of 1/10 means that there is pain and will be treated at our facility. 5.   High, inaccurate reporting will be documented as "Symptom Exaggeration", leading to loss of credibility and suspicions of possible secondary gains such as obtaining more narcotics, or wanting to appear disabled, for fraudulent reasons. 6.   Only pain levels of 5 or below will be seen at our facility. 7.   Pain levels of 6 and above will be sent to the Emergency Department and the appointment  cancelled.  ______________________________________________________________________________________________     

## 2019-06-24 NOTE — Progress Notes (Signed)
Patient's Name: Jean Gomez  MRN: [id number]  Referring Provider: Sherron Monday, MD  DOB: [birth year]  PCP: Sherron Monday, MD  DOS: 06/24/2019  Note by: Oswaldo Done, MD  Service setting: Ambulatory outpatient  Specialty: Interventional Pain Management  Patient type: Established  Location: [facility] (AMB) [facility] Facility  Visit type: Interventional Procedure   Primary Reason for Visit: Interventional Pain Management Treatment. CC: Back Pain (low)  Procedure:          Anesthesia, Analgesia, Anxiolysis:  Type: Diagnostic Epidural Steroid Injection #1  Region: Caudal Level: Sacrococcygeal   Laterality: Midline       Type: Moderate (Conscious) Sedation combined with Local Anesthesia Indication(s): Analgesia and Anxiety Route: Intravenous (IV) IV Access: Secured Sedation: Meaningful verbal contact was maintained at all times during the procedure  Local Anesthetic: Lidocaine 1-2%  Position: Prone   Indications: 1. DDD (degenerative disc disease), lumbosacral   2. 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 Chronic lower extremity pain (Tertiary Area of Pain) (Bilateral) (R>L)   4. Chronic lumbar radiculopathy (Right)   5. Lumbar spondylosis   6. History of allergy to radiographic contrast media    Pain Score: Pre-procedure: 10-Worst pain ever/10 Post-procedure: 2 /10  Pre-op Assessment:  Jean Gomez is a 63 y.o. (year old), female patient, seen today for interventional treatment. She  has a past surgical history that includes Cholecystectomy; Abdominal hysterectomy; and Spinal fusion. Jean Gomez has a current medication list which includes the following prescription(s): albuterol sulfate, alprazolam, amlodipine, aspirin ec, atorvastatin, betamethasone (augmented), buspirone, citalopram, clotrimazole-betamethasone, diclofenac sodium, diphenhydramine, doxycycline, medical compression stockings, hydrochlorothiazide,  hydrocodone-acetaminophen, hydrocodone-acetaminophen, hydrocodone-acetaminophen, ipratropium, levetiracetam, lidocaine, linaclotide, losartan, mirtazapine, nitroglycerin, olmesartan, ondansetron, pantoprazole, potassium chloride sa, quetiapine, sucralfate, tizanidine, torsemide, and venlafaxine, and the following Facility-Administered Medications: lactated ringers and midazolam. Her primarily concern today is the Back Pain (low)  Initial Vital Signs:  Pulse/HCG Rate: (!) 104  Temp: 98.5 F (36.9 C) Resp: 16 BP: (!) 122/98 SpO2: 100 %  BMI: Estimated body mass index is 28.34 kg/m as calculated from the following:   Height as of this encounter:  (1.6 m).   Weight as of this encounter: 160 lb (72.6 kg).  Risk Assessment: Allergies: Reviewed. She is allergic to contrast media [iodinated diagnostic agents]; gabapentin; labetalol; sulfabenzamide; fentanyl; and penicillins.  Allergy Precautions: None required Coagulopathies: Reviewed. None identified.  Blood-thinner therapy: None at this time Active Infection(s): Reviewed. None identified. Jean Gomez is afebrile  Site Confirmation: Jean Gomez was asked to confirm the procedure and laterality before marking the site Procedure checklist: Completed Consent: Before the procedure and under the influence of no sedative(s), amnesic(s), or anxiolytics, the patient was informed of the treatment options, risks and possible complications. To fulfill our ethical and legal obligations, as recommended by the American Medical Association's Code of Ethics, I have informed the patient of my clinical impression; the nature and purpose of the treatment or procedure; the risks, benefits, and possible complications of the intervention; the alternatives, including doing nothing; the risk(s) and benefit(s) of the alternative treatment(s) or procedure(s); and the risk(s) and benefit(s) of doing nothing. The patient was provided information about the general risks and  possible complications associated with the procedure. These may include, but are not limited to: failure to achieve desired goals, infection, bleeding, organ or nerve damage, allergic reactions, paralysis, and death. In addition, the patient was informed of those risks and complications associated to Spine-related procedures,  such as failure to decrease pain; infection (i.e.: Meningitis, epidural or intraspinal abscess); bleeding (i.e.: epidural hematoma, subarachnoid hemorrhage, or any other type of intraspinal or peri-dural bleeding); organ or nerve damage (i.e.: Any type of peripheral nerve, nerve root, or spinal cord injury) with subsequent damage to sensory, motor, and/or autonomic systems, resulting in permanent pain, numbness, and/or weakness of one or several areas of the body; allergic reactions; (i.e.: anaphylactic reaction); and/or death. Furthermore, the patient was informed of those risks and complications associated with the medications. These include, but are not limited to: allergic reactions (i.e.: anaphylactic or anaphylactoid reaction(s)); adrenal axis suppression; blood sugar elevation that in diabetics may result in ketoacidosis or comma; water retention that in patients with history of congestive heart failure may result in shortness of breath, pulmonary edema, and decompensation with resultant heart failure; weight gain; swelling or edema; medication-induced neural toxicity; particulate matter embolism and blood vessel occlusion with resultant organ, and/or nervous system infarction; and/or aseptic necrosis of one or more joints. Finally, the patient was informed that Medicine is not an exact science; therefore, there is also the possibility of unforeseen or unpredictable risks and/or possible complications that may result in a catastrophic outcome. The patient indicated having understood very clearly. We have given the patient no guarantees and we have made no promises. Enough time was  given to the patient to ask questions, all of which were answered to the patient's satisfaction. Jean Gomez has indicated that she wanted to continue with the procedure. Attestation: I, the ordering provider, attest that I have discussed with the patient the benefits, risks, side-effects, alternatives, likelihood of achieving goals, and potential problems during recovery for the procedure that I have provided informed consent. Date  Time: 06/24/2019 12:46 PM  Pre-Procedure Preparation:  Monitoring: As per clinic protocol. Respiration, ETCO2, SpO2, BP, heart rate and rhythm monitor placed and checked for adequate function Safety Precautions: Patient was assessed for positional comfort and pressure points before starting the procedure. Time-out: I initiated and conducted the "Time-out" before starting the procedure, as per protocol. The patient was asked to participate by confirming the accuracy of the "Time Out" information. Verification of the correct person, site, and procedure were performed and confirmed by me, the nursing staff, and the patient. "Time-out" conducted as per Joint Commission's Universal Protocol (UP.01.01.01). Time: 1156  Description of Procedure:          Target Area: Caudal Epidural Canal. Approach: Midline approach. Area Prepped: Entire Posterior Sacrococcygeal Region Prepping solution: DuraPrep (Iodine Povacrylex [0.7% available iodine] and Isopropyl Alcohol, 74% w/w) Safety Precautions: Aspiration looking for blood return was conducted prior to all injections. At no point did we inject any substances, as a needle was being advanced. No attempts were made at seeking any paresthesias. Safe injection practices and needle disposal techniques used. Medications properly checked for expiration dates. SDV (single dose vial) medications used. Description of the Procedure: Protocol guidelines were followed. The patient was placed in position over the fluoroscopy table. The target area was  identified and the area prepped in the usual manner. Skin & deeper tissues infiltrated with local anesthetic. Appropriate amount of time allowed to pass for local anesthetics to take effect. The procedure needles were then advanced to the target area. Proper needle placement secured. Negative aspiration confirmed. Solution injected in intermittent fashion, asking for systemic symptoms every 0.5cc of injectate. The needles were then removed and the area cleansed, making sure to leave some of the prepping solution back to take advantage of  its long term bactericidal properties. Vitals:   06/24/19 1400 06/24/19 1410 06/24/19 1420 06/24/19 1430  BP: (!) 134/91 (!) 122/99 (!) 134/95 (!) 144/91  Pulse: 94 90 97 92  Resp: 20 16 16 16   Temp:  98.2 F (36.8 C)  98.1 F (36.7 C)  SpO2: 99% 100% 97% 100%  Weight:      Height:        Start Time: 1156 hrs. End Time: 1400 hrs. Materials:  Needle(s) Type: Epidural needle Gauge: 17G Length: 3.5-in Medication(s): Please see orders for medications and dosing details.  Imaging Guidance (Spinal):          Type of Imaging Technique: Fluoroscopy Guidance (Spinal) Indication(s): Assistance in needle guidance and placement for procedures requiring needle placement in or near specific anatomical locations not easily accessible without such assistance. Exposure Time: Please see nurses notes. Contrast: None used. Fluoroscopic Guidance: I was personally present during the use of fluoroscopy. "Tunnel Vision Technique" used to obtain the best possible view of the target area. Parallax error corrected before commencing the procedure. "Direction-depth-direction" technique used to introduce the needle under continuous pulsed fluoroscopy. Once target was reached, antero-posterior, oblique, and lateral fluoroscopic projection used confirm needle placement in all planes. Images permanently stored in EMR. Interpretation: No contrast injected. I personally interpreted the  imaging intraoperatively. Adequate needle placement confirmed in multiple planes. Permanent images saved into the patient's record.  Antibiotic Prophylaxis:   Anti-infectives (From admission, onward)   None     Indication(s): None identified  Post-operative Assessment:  Post-procedure Vital Signs:  Pulse/HCG Rate: 92  Temp: 98.1 F (36.7 C) Resp: 16 BP: (!) 144/91 SpO2: 100 %  EBL: None  Complications: No immediate post-treatment complications observed by team, or reported by patient.  Note: The patient tolerated the entire procedure well. A repeat set of vitals were taken after the procedure and the patient was kept under observation following institutional policy, for this type of procedure. Post-procedural neurological assessment was performed, showing return to baseline, prior to discharge. The patient was provided with post-procedure discharge instructions, including a section on how to identify potential problems. Should any problems arise concerning this procedure, the patient was given instructions to immediately contact us, at any time, without hesitation. In any case, we plan to contact the patient by telephone for a follow-up status report regarding this interventional procedure.  Comments:  No additional relevant information.  Plan of Care  Orders:  Orders Placed This Encounter  Procedures  . Caudal Epidural Injection    Scheduling Instructions:     Laterality: Midline     Level(s): Sacrococcygeal canal (Tailbone area)     Sedation: Patient's choice     Timeframe: Today    Order Specific Question:   Where will this procedure be performed?    Answer:   [facility]  . DG [facility] C-ARM 1-60 MIN NO REPORT    Intraoperative interpretation by procedural physician at [facility].    Standing Status:   Standing    Number of Occurrences:   1    Order Specific Question:   Reason for exam:    Answer:   Assistance in needle guidance and placement  for procedures requiring needle placement in or near specific anatomical locations not easily accessible without such assistance.  . Provider attestation of informed consent for procedure/surgical case    I, the ordering provider, attest that I have discussed with the patient the benefits, risks, side effects, alternatives, likelihood of achieving  goals and potential problems during recovery for the procedure that I have provided informed consent.    Standing Status:   Standing    Number of Occurrences:   1  . Informed Consent Details: Transcribe to consent form and obtain patient signature    Standing Status:   Standing    Number of Occurrences:   1    Order Specific Question:   Procedure    Answer:   Caudal epidural steroid injection under fluoroscopic guidance.    Order Specific Question:   Surgeon    Answer:   Sydnee LevansFrancisco A. Laban EmperorNaveira, MD    Order Specific Question:   Indication/Reason    Answer:   Low back pain and/or lower extremity pain secondary to lumbosacral radiculitis/radiculopathy  . Miscellanous precautions    Standing Status:   Standing    Number of Occurrences:   1   Chronic Opioid Analgesic:  Hydrocodone/acetaminophen 10/325 3 times daily (30 mg/day of Hydrocodone). MME/day:30mg /day.   Medications ordered for procedure: Meds ordered this encounter  Medications  . lidocaine (XYLOCAINE) 2 % (with pres) injection 400 mg  . lactated ringers infusion 1,000 mL  . midazolam (VERSED) 5 MG/5ML injection 1-2 mg    Make sure Flumazenil is available in the pyxis when using this medication. If oversedation occurs, administer 0.2 mg IV over 15 sec. If after 45 sec no response, administer 0.2 mg again over 1 min; may repeat at 1 min intervals; not to exceed 4 doses (1 mg)  . sodium chloride flush (NS) 0.9 % injection 2 mL  . ropivacaine (PF) 2 mg/mL (0.2%) (NAROPIN) injection 2 mL  . triamcinolone acetonide (KENALOG-40) injection 40 mg  . diphenhydrAMINE (BENADRYL) injection 25 mg    Medications administered: We administered lidocaine, midazolam, sodium chloride flush, ropivacaine (PF) 2 mg/mL (0.2%), triamcinolone acetonide, and diphenhydrAMINE.  See the medical record for exact dosing, route, and time of administration.  Follow-up plan:   Return in about 2 weeks (around 07/08/2019) for (VV), E/M (PP).      Considering: NOTE: CONTRAST & FENTANYL Allergy. Possible bilateral occipital nerve RFA Possible bilateral occipital nerve peripheral nerves stimulator trial Diagnostic bilateral cervical facet block Possible bilateral cervical facet RFA Diagnostic right-sided cervical epidural steroid injection Diagnostic bilateral intra-articular shoulder joint injection Diagnostic bilateral suprascapular nerve block Possible bilateral suprascapular nerve RFA Diagnostic right-sided lumbar epidural steroid injection Diagnostic bilateral transforaminal epidural steroid injection Diagnostic bilateral intra-articular hip joint injection Diagnostic bilateral femoral nerve + obturator nerve block Possible bilateral femoral nerve + obturator nerve RFA Diagnostic bilateral genicular nerve block Possible bilateral genicular nerve RFA   Palliative PRN treatment(s): Palliative bilateral lumbar facet blocks   Palliative bilateral lumbar facet RFA #2   Diagnostic bilateral GONB#2 Palliative bilateral intra-articular knee joint injection (w/ steroid)  Palliative bilateral intra-articular Hyalgan knee injections(series#2) (series#1completed on 07/04/2018)     Recent Visits Date Type Provider Dept  06/16/19 Office Visit Delano MetzNaveira, Saharra Santo, MD [facility]  Showing recent visits within past 90 days and meeting all other requirements   Today's Visits Date Type Provider Dept  06/24/19 Procedure visit Delano MetzNaveira, Nashiya Disbrow, MD [facility]  Showing today's visits and meeting all other requirements   Future Appointments Date Type Provider Dept   07/10/19 Appointment Delano MetzNaveira, Wakisha Alberts, MD [facility]  09/03/19 Appointment Delano MetzNaveira, Niyah Mamaril, MD [facility]  Showing future appointments within next 90 days and meeting all other requirements   Disposition: Discharge home  Discharge Date & Time: 06/24/2019; 1435 hrs.   Primary  Care Physician: Sherron Mondayejan-[name], S Ahmed, MD [facility] Note by: Oswaldo DoneFrancisco A Raiza Kiesel, MD Date: 06/24/2019; Time: 3:11 PM  Disclaimer:  Medicine is not an Visual merchandiserexact science. The only guarantee in medicine is that nothing is guaranteed. It is important to note that the decision to proceed with this intervention was based on the information collected from the patient. The Data and conclusions were drawn from the patient's questionnaire, the interview, and the physical examination. Because the information was provided in large part by the patient, it cannot be guaranteed that it has not been purposely or unconsciously manipulated. Every effort has been made to obtain as much relevant data as possible for this evaluation. It is important to note that the conclusions that lead to this procedure are derived in large part from the available data. Always take into account that the treatment will also be dependent on availability of resources and existing treatment guidelines, considered by other Pain Management Practitioners as being common knowledge and practice, at the time of the intervention. For Medico-Legal purposes, it is also important to point out that variation in procedural techniques and pharmacological choices are the acceptable norm. The indications, contraindications, technique, and results of the above procedure should only be interpreted and judged by a Board-Certified Interventional Pain Specialist with extensive familiarity and expertise in the same exact procedure and technique.

## 2019-06-24 NOTE — Progress Notes (Signed)
Safety precautions to be maintained throughout the outpatient stay will include: orient to surroundings, keep bed in low position, maintain call bell within reach at all times, provide assistance with transfer out of bed and ambulation.  

## 2019-06-25 ENCOUNTER — Telehealth: Payer: Self-pay

## 2019-06-25 NOTE — Telephone Encounter (Signed)
Post procedure phone call.  Patient states she is doing well.  

## 2019-07-02 ENCOUNTER — Ambulatory Visit
Admission: RE | Admit: 2019-07-02 | Discharge: 2019-07-02 | Disposition: A | Discharge status: Home / Self Care | Payer: Medicare Other | Source: Ambulatory Visit | Attending: Pain Medicine | Admitting: Pain Medicine

## 2019-07-02 ENCOUNTER — Other Ambulatory Visit: Payer: Self-pay

## 2019-07-02 DIAGNOSIS — M5136 Other intervertebral disc degeneration, lumbar region: Secondary | ICD-10-CM | POA: Diagnosis not present

## 2019-07-03 ENCOUNTER — Encounter: Payer: Self-pay | Admitting: Pain Medicine

## 2019-07-03 DIAGNOSIS — R937 Abnormal findings on diagnostic imaging of other parts of musculoskeletal system: Secondary | ICD-10-CM | POA: Insufficient documentation

## 2019-07-03 NOTE — Progress Notes (Signed)
Please follow-up with Primary Care Physician for Kidney Ultrasound.

## 2019-07-09 ENCOUNTER — Encounter: Payer: Self-pay | Admitting: Pain Medicine

## 2019-07-10 ENCOUNTER — Other Ambulatory Visit: Payer: Self-pay

## 2019-07-10 ENCOUNTER — Ambulatory Visit: Payer: Medicare Other | Attending: Pain Medicine | Admitting: Pain Medicine

## 2019-07-10 DIAGNOSIS — R937 Abnormal findings on diagnostic imaging of other parts of musculoskeletal system: Secondary | ICD-10-CM

## 2019-07-10 DIAGNOSIS — M5137 Other intervertebral disc degeneration, lumbosacral region: Secondary | ICD-10-CM | POA: Diagnosis not present

## 2019-07-10 DIAGNOSIS — G8929 Other chronic pain: Secondary | ICD-10-CM

## 2019-07-10 DIAGNOSIS — M5416 Radiculopathy, lumbar region: Secondary | ICD-10-CM | POA: Diagnosis not present

## 2019-07-10 DIAGNOSIS — M5441 Lumbago with sciatica, right side: Secondary | ICD-10-CM

## 2019-07-10 DIAGNOSIS — M48061 Spinal stenosis, lumbar region without neurogenic claudication: Secondary | ICD-10-CM

## 2019-07-10 NOTE — Progress Notes (Addendum)
Pain Management Virtual Encounter Note - Virtual Visit via Telephone Telehealth (real-time audio visits between healthcare provider and patient).   Patient's Phone No. & Preferred Pharmacy:  [phone] (home); [phone] (mobile); (Preferred) [phone] No e-mail address on record  [facility] - [place], [place] - [address] [place] [address] Phone: [phone] Fax: [phone]    Pre-screening note:  Our staff contacted Jean Gomez and offered her an "in person", "face-to-face" appointment versus a telephone encounter. She indicated preferring the telephone encounter, at this time.   Reason for Virtual Visit: COVID-19*   Social distancing based on CDC and AMA recommendations.   I contacted Jean Gomez on 07/10/2019 via telephone.      I clearly identified myself as Oswaldo Done, MD. I verified that I was speaking with the correct person using two identifiers (Name: Jean Gomez, and date of birth: 01-21-1956).  Advanced Informed Consent I sought verbal advanced consent from Jean Gomez for virtual visit interactions. I informed Jean Gomez of possible security and privacy concerns, risks, and limitations associated with providing "not-in-person" medical evaluation and management services. I also informed Jean Gomez of the availability of "in-person" appointments. Finally, I informed her that there would be a charge for the virtual visit and that she could be  personally, fully or partially, financially responsible for it. Ms. Caroll expressed understanding and agreed to proceed.   Historic Elements   Jean Gomez is a 63 y.o. year old, female patient evaluated today after her last encounter by our practice on 06/25/2019. Jean Gomez  has a past medical history of Acute postoperative pain (01/16/2019), Allergy, Asthma, Cerebral aneurysm, Chest pain (11/01/2016), Chronic migraine, Chronic respiratory failure (HCC), COPD (chronic  obstructive pulmonary disease) (HCC), Depression, Hypertension, Osteoarthritis, Pneumonia, Sarcoid, and Seizures (HCC). She also  has a past surgical history that includes Cholecystectomy; Abdominal hysterectomy; Spinal fusion; Back surgery; Esophagogastroduodenoscopy (egd) with propofol (N/A, 08/19/2019); and Colonoscopy with propofol (N/A, 08/19/2019). Jean Gomez has a current medication list which includes the following prescription(s): albuterol sulfate, alprazolam, amlodipine, aspirin ec, atorvastatin, betamethasone (augmented), buspirone, citalopram, clotrimazole-betamethasone, diclofenac sodium, diphenhydramine, doxycycline, medical compression stockings, hydrochlorothiazide, hydrocodone-acetaminophen, hydrocodone-acetaminophen, hydrocodone-acetaminophen, ipratropium, levetiracetam, lidocaine, linaclotide, losartan, mirtazapine, nitroglycerin, olmesartan, ondansetron, pantoprazole, potassium chloride sa, quetiapine, sucralfate, tizanidine, torsemide, venlafaxine, albuterol, aspirin-calcium carbonate, budesonide-formoterol, calcium carbonate, clotrimazole-betamethasone, dexilant, hydroxyzine, lactulose, oxycodone-acetaminophen, promethazine, quetiapine, and trazodone. She  reports that she has never smoked. She has never used smokeless tobacco. She reports current alcohol use. She reports that she does not use drugs. Jean Gomez is allergic to contrast media [iodinated diagnostic agents]; gabapentin; labetalol; sulfabenzamide; fentanyl; and penicillins.   HPI  Today, she is being contacted for a post-procedure assessment.  Today we went over the results of her recent lumbar MRI, which are as follows:  (07/02/2019) Lumbar MRI FINDINGS: Segmentation: For the purposes of this dictation, five lumbar vertebrae are assumed and the caudal most well-formed intervertebral disc is designated L5-S1. Alignment: Straightening of the expected lumbar lordosis. L5-S1 grade 1 anterolisthesis. Vertebrae: Vertebral body height  is maintained. No suspicious osseous lesions. L4 vertebral body hemangioma. Conus medullaris and cauda equina: Conus extends to the L1 level. Conus and cauda equina appear normal. Paraspinal and other soft tissues: Atrophy of the lumbar paraspinal musculature.  Disc levels: Levels of mild-to-moderate disc degeneration greatest at L2-3 and L3-4. Multilevel ventrolateral osteophytes. L1-2: Disc bulge. No significant spinal canal stenosis or neural foraminal narrowing. L2-3: Disc bulge. Superimposed shallow right foraminal disc  protrusion. No significant spinal canal stenosis. Moderate right with mild left neural foraminal narrowing. L3-4: Disc bulge. Superimposed shallow right foraminal disc protrusion. Facet arthrosis/ligamentum flavum hypertrophy. Mild spinal canal stenosis. Moderate bilateral neural foraminal narrowing. L4-5: Disc bulge. Superimposed small left foraminal disc extrusion. Facet arthrosis/ligamentum flavum hypertrophy. Mild spinal canal stenosis. Moderate right neural foraminal narrowing. Moderate/severe left neural foraminal narrowing with possible impingement of the exiting left L4 nerve root. L5-S1: Grade 1 anterolisthesis with disc uncovering. Facet arthrosis/ligamentum flavum hypertrophy. Small right facet joint effusion with 12 mm right-sided posteriorly projecting synovial facet cyst. Mild spinal canal stenosis. Moderate/severe right neural foraminal narrowing with impingement of the exiting right L5 nerve root. Moderate left neural foraminal narrowing.  Mild right hydronephrosis.  IMPRESSION: - Lumbar spondylosis. No more than mild spinal canal stenosis. At L4-5, a small disc extrusion contributes to moderate/severe left neural foraminal narrowing with possible impingement of the exiting left L4 nerve root. At L5-S1, degenerative grade 1 anterolisthesis contributes to moderate/severe right neural foraminal narrowing with impingement of the exiting right L5 nerve root.  -  Additional sites of mild and moderate foraminal narrowing as described.  - Mild right hydroureteronephrosis. Consider renal ultrasound for further evaluation.  Based on these results and the fact that the patient continues to have problems despite the lumbar facet radiofrequency ablation and the caudal epidural steroid injections, we will be requesting a neurosurgical evaluation for possible surgical alternatives to her problems.  Today the patient indicated experiencing some spasms in the lower back, which I believe may be secondary to her loss of electrolytes as a consequence to the renal effects of the steroids.  Because of this, I have provided the patient with information regarding its treatment with that over-the-counter magnesium, day tonic water with quinine, and the Gatorade.  Post-Procedure Evaluation  Procedure: Diagnostic midline caudal epidural steroid injection #1 under fluoroscopic guidance and IV sedation Pre-procedure pain level:  10/10 Post-procedure: 2/10 (> 50% relief)  Sedation: Sedation provided.  Effectiveness during initial hour after procedure(Ultra-Short Term Relief): 100 %   Local anesthetic used: Long-acting (4-6 hours) Effectiveness: Defined as any analgesic benefit obtained secondary to the administration of local anesthetics. This carries significant diagnostic value as to the etiological location, or anatomical origin, of the pain. Duration of benefit is expected to coincide with the duration of the local anesthetic used.  Effectiveness during initial 4-6 hours after procedure(Short-Term Relief): 90 %   Long-term benefit: Defined as any relief past the pharmacologic duration of the local anesthetics.  Effectiveness past the initial 6 hours after procedure(Long-Term Relief): 40 %(4 days)   Current benefits: Defined as benefit that persist at this time.   Analgesia:  <50% better, most of her benefit seems to be in the lower extremities but she continues to have  the back pain. Function: Back to baseline ROM: Back to baseline  Pharmacotherapy Assessment  Analgesic: Hydrocodone/acetaminophen 10/325 3 times daily (30 mg/day of Hydrocodone)(enough until 09/16/2019) MME/day:30mg /day.   Monitoring: Pharmacotherapy: No side-effects or adverse reactions reported. [place] PMP: PDMP not reviewed this encounter.       Compliance: No problems identified. Effectiveness: Clinically acceptable. Plan: Refer to "POC".  Pertinent Labs   SAFETY SCREENING Profile Lab Results  Component Value Date   SARSCOV2NAA NEGATIVE 08/15/2019   HIV Non Reactive 12/18/2018   Renal Function Lab Results  Component Value Date   BUN 15 07/14/2019   CREATININE 0.61 07/14/2019   GFRAA >60 07/14/2019   GFRNONAA >60 07/14/2019   Hepatic Function Lab Results  Component Value Date   Gomez 23 07/14/2019   ALT 17 07/14/2019   ALBUMIN 4.9 07/14/2019   UDS Summary  Date Value Ref Range Status  03/27/2018 FINAL  Final    Comment:    ==================================================================== TOXASSURE SELECT 13 (MW) ==================================================================== Test                             Result       Flag       Units Drug Present and Declared for Prescription Verification   Hydrocodone                    646          EXPECTED   ng/mg creat   Hydromorphone                  367          EXPECTED   ng/mg creat   Dihydrocodeine                 317          EXPECTED   ng/mg creat   Norhydrocodone                 2063         EXPECTED   ng/mg creat    Sources of hydrocodone include scheduled prescription    medications. Hydromorphone, dihydrocodeine and norhydrocodone are    expected metabolites of hydrocodone. Hydromorphone and    dihydrocodeine are also available as scheduled prescription    medications. ==================================================================== Test                      Result    Flag   Units      Ref Range    Creatinine              24               mg/dL      >=16>=20 ==================================================================== Declared Medications:  The flagging and interpretation on this report are based on the  following declared medications.  Unexpected results may arise from  inaccuracies in the declared medications.  **Note: The testing scope of this panel includes these medications:  Hydrocodone (Hydrocodone-Acetaminophen)  **Note: The testing scope of this panel does not include following  reported medications:  Acetaminophen (Hydrocodone-Acetaminophen)  Albuterol  Amlodipine  Aspirin (Aspirin 81)  Atorvastatin  Budenoside (Symbicort)  Clotrimazole (Lotrisone)  Diclofenac  Escitalopram  Formoterol (Symbicort)  Hydrochlorothiazide (HCTZ)  Ipratropium  Ketoconazole  Lactulose  Levetiracetam  Losartan (Losartan Potassium)  Lubiprostone  Mirtazapine  Nitroglycerin  Ondansetron  Pantoprazole  Potassium  Sucralfate  Tizanidine  Topical  Torsemide  Venlafaxine  Vitamin D2 (Ergocalciferol) ==================================================================== For clinical consultation, please call [phone]. ====================================================================    Note: Above Lab results reviewed.  Recent imaging  CT Renal Stone Study CLINICAL DATA:  Hematuria.  Lower abdominal pain  EXAM: CT ABDOMEN AND PELVIS WITHOUT CONTRAST  TECHNIQUE: Multidetector CT imaging of the abdomen and pelvis was performed following the standard protocol without IV contrast.  COMPARISON:  CT abdomen pelvis 08/18/2014  FINDINGS: Lower chest: Mild scarring in the right lung base. No acute infiltrate or effusion  Hepatobiliary: Postop cholecystectomy. Common bile duct mildly prominent 10 mm. No liver lesion.  Pancreas: Negative  Spleen: Negative  Adrenals/Urinary Tract: Mild fullness right renal pelvis. Right ureter nondilated. 3 mm calculus anterior  to the iliac vessels on the right appears to be within the ureter but could be a phlebolith. This was not present previously.  Left kidney normal. No other right renal calculi. Normal urinary bladder.  Stomach/Bowel: Negative for bowel obstruction. No bowel obstruction or edema.  Vascular/Lymphatic: Minimal atherosclerotic disease in the aorta. No lymphadenopathy  Reproductive: Hysterectomy.  No pelvic mass.  Other: No free fluid  Musculoskeletal: Mild degenerative changes lumbar spine without acute abnormality.  IMPRESSION: 3 mm calcification anterior to the iliac vessels in the right, probable ureteral calculus versus phlebolith. Mild fullness of the right renal pelvis. No other renal calculi.  Electronically Signed   By: Franchot Gallo M.D.   On: 07/14/2019 16:04 DG Chest 2 View CLINICAL DATA:  Pt reports coughing blood and passing blood in urine since last night. Hx of asthma, COPD, HTN. Sarcoidosis. Nonsmoker.  EXAM: CHEST - 2 VIEW  COMPARISON:  12/29/2018  FINDINGS: Heart size is normal. There is architectural distortion and prominence of interstitial markings particularly within the perihilar regions and UPPER lobes. Scarring identified at both lung bases. There are no focal consolidations or pleural effusions. No pulmonary edema.  IMPRESSION: Chronic changes. No evidence for acute abnormality.  Electronically Signed   By: Nolon Nations M.D.   On: 07/14/2019 13:42    Assessment  The primary encounter diagnosis was Abnormal MRI, lumbar spine. 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  Plan of Care  I am having Jean Gomez maintain her Albuterol Sulfate, betamethasone (augmented), ipratropium, nitroGLYCERIN, aspirin EC, amLODipine,  hydrochlorothiazide, losartan, pantoprazole, sucralfate, potassium chloride SA, clotrimazole-betamethasone, venlafaxine, levETIRAcetam, atorvastatin, linaclotide, citalopram, ondansetron, diphenhydrAMINE, torsemide, Medical Compression Stockings, QUEtiapine, tiZANidine, HYDROcodone-acetaminophen, HYDROcodone-acetaminophen, HYDROcodone-acetaminophen, diclofenac sodium, lidocaine, ALPRAZolam, busPIRone, doxycycline, mirtazapine, and olmesartan.  Pharmacotherapy (Medications Ordered): No orders of the defined types were placed in this encounter.  Orders:  Orders Placed This Encounter  Procedures   Ambulatory referral to Neurosurgery    Referral Priority:   Routine    Referral Type:   Surgical    Referral Reason:   Specialty Services Required    Requested Specialty:   Neurosurgery    Number of Visits Requested:   1   Follow-up plan:   Return for scheduled appointment.      Considering: NOTE: CONTRAST & FENTANYL Allergy. Possible bilateral occipital nerve RFA Possible bilateral occipital nerve peripheral nerves stimulator trial Diagnostic bilateral cervical facet block Possible bilateral cervical facet RFA Diagnostic right-sided cervical epidural steroid injection Diagnostic bilateral intra-articular shoulder joint injection Diagnostic bilateral suprascapular nerve block Possible bilateral suprascapular nerve RFA Diagnostic right-sided lumbar epidural steroid injection Diagnostic bilateral transforaminal epidural steroid injection Diagnostic bilateral intra-articular hip joint injection Diagnostic bilateral femoral nerve + obturator nerve block Possible bilateral femoral nerve + obturator nerve RFA Diagnostic bilateral genicular nerve block Possible bilateral genicular nerve RFA   Palliative PRN treatment(s): Palliative bilateral lumbar facet blocks   Palliative bilateral lumbar facet RFA #2   Diagnostic bilateral GONB#2 Palliative bilateral intra-articular  knee joint injection (w/ steroid)  Palliative bilateral intra-articular Hyalgan knee injections(series#2) (series#1completed on 07/04/2018)      Recent Visits Date Type Provider Dept  07/10/19 Office Visit Milinda Pointer, MD [facility]  06/24/19 Procedure visit Milinda Pointer, MD [facility]  06/16/19 Office Visit Milinda Pointer, MD [facility]  Showing recent visits within past 64  days and meeting all other requirements   Today's Visits Date Type Provider Dept  09/03/19 Appointment Delano MetzNaveira, Sharae Zappulla, MD [facility]  Showing today's visits and meeting all other requirements   Future Appointments No visits were found meeting these conditions.  Showing future appointments within next 90 days and meeting all other requirements   I discussed the assessment and treatment plan with the patient. The patient was provided an opportunity to ask questions and all were answered. The patient agreed with the plan and demonstrated an understanding of the instructions.  Patient advised to call back or seek an in-person evaluation if the symptoms or condition worsens.  Total duration of non-face-to-face encounter: 25 minutes.  Note by: Oswaldo DoneFrancisco A Sharanya Templin, MD Date: 07/10/2019; Time: 6:09 AM  Note: This dictation was prepared with Dragon dictation. Any transcriptional errors that may result from this process are unintentional.  Disclaimer:  * Given the special circumstances of the COVID-19 pandemic, the federal government has announced that the Office for Civil Rights (OCR) will exercise its enforcement discretion and will not impose penalties on physicians using telehealth in the event of noncompliance with regulatory requirements under the [employer] Insurance Portability and Accountability Act (HIPAA) in connection with the good faith provision of telehealth during the COVID-19 national public health emergency. (AMA)

## 2019-07-10 NOTE — Patient Instructions (Signed)
____________________________________________________________________________________________  Muscle Spasms & Cramps  Cause:  The most common cause of muscle spasms and cramps is vitamin and/or electrolyte (calcium, potassium, sodium, etc.) deficiencies.  Possible triggers: Sweating - causes loss of electrolytes thru the skin. Steroids - causes loss of electrolytes thru the urine.  Treatment: 1. Gatorade (or any other electrolyte-replenishing drink) - Take 1, 8 oz glass with each meal (3 times a day). 2. OTC (over-the-counter) Magnesium 400 to 500 mg - Take 1 tablet twice a day (one with breakfast and one before bedtime). If you have kidney problems, talk to your primary care physician before taking any Magnesium. 3. Tonic Water with quinine - Take 1, 8 oz glass before bedtime.   ____________________________________________________________________________________________    

## 2019-07-14 ENCOUNTER — Emergency Department: Payer: Medicare Other

## 2019-07-14 ENCOUNTER — Emergency Department
Admission: EM | Admit: 2019-07-14 | Discharge: 2019-07-14 | Disposition: A | Discharge status: Home / Self Care | Payer: Medicare Other | Attending: Emergency Medicine | Admitting: Emergency Medicine

## 2019-07-14 ENCOUNTER — Other Ambulatory Visit: Payer: Self-pay

## 2019-07-14 DIAGNOSIS — R1030 Lower abdominal pain, unspecified: Secondary | ICD-10-CM | POA: Diagnosis not present

## 2019-07-14 DIAGNOSIS — I10 Essential (primary) hypertension: Secondary | ICD-10-CM | POA: Diagnosis not present

## 2019-07-14 DIAGNOSIS — N3090 Cystitis, unspecified without hematuria: Secondary | ICD-10-CM

## 2019-07-14 DIAGNOSIS — J449 Chronic obstructive pulmonary disease, unspecified: Secondary | ICD-10-CM | POA: Diagnosis not present

## 2019-07-14 DIAGNOSIS — R319 Hematuria, unspecified: Secondary | ICD-10-CM

## 2019-07-14 LAB — COMPREHENSIVE METABOLIC PANEL
ALT: 17 U/L (ref 0–44)
AST: 23 U/L (ref 15–41)
Albumin: 4.9 g/dL (ref 3.5–5.0)
Alkaline Phosphatase: 49 U/L (ref 38–126)
Anion gap: 12 (ref 5–15)
BUN: 15 mg/dL (ref 8–23)
CO2: 25 mmol/L (ref 22–32)
Calcium: 9.5 mg/dL (ref 8.9–10.3)
Chloride: 101 mmol/L (ref 98–111)
Creatinine, Ser: 0.61 mg/dL (ref 0.44–1.00)
GFR calc Af Amer: >60 mL/min (ref >60)
GFR calc non Af Amer: >60 mL/min (ref >60)
Glucose, Bld: 95 mg/dL (ref 70–99)
Potassium: 3.9 mmol/L (ref 3.5–5.1)
Sodium: 138 mmol/L (ref 135–145)
Total Bilirubin: 0.7 mg/dL (ref 0.3–1.2)
Total Protein: 7.7 g/dL (ref 6.5–8.1)

## 2019-07-14 LAB — URINALYSIS, COMPLETE (UACMP) WITH MICROSCOPIC
Bacteria, UA: NONE SEEN
Bilirubin Urine: NEGATIVE
Glucose, UA: NEGATIVE mg/dL
Ketones, ur: NEGATIVE mg/dL
Nitrite: NEGATIVE
Protein, ur: 100 mg/dL — AB
RBC / HPF: >50 RBC/hpf — ABNORMAL HIGH (ref 0–5)
Specific Gravity, Urine: 1.008 (ref 1.005–1.030)
WBC, UA: >50 WBC/hpf — ABNORMAL HIGH (ref 0–5)
pH: 7 (ref 5.0–8.0)

## 2019-07-14 LAB — CBC WITH DIFFERENTIAL/PLATELET
Abs Immature Granulocytes: 0.02 10*3/uL (ref 0.00–0.07)
Basophils Absolute: 0 10*3/uL (ref 0.0–0.1)
Basophils Relative: 0 %
Eosinophils Absolute: 0 10*3/uL (ref 0.0–0.5)
Eosinophils Relative: 0 %
HCT: 31.4 % — ABNORMAL LOW (ref 36.0–46.0)
Hemoglobin: 10 g/dL — ABNORMAL LOW (ref 12.0–15.0)
Immature Granulocytes: 0 %
Lymphocytes Relative: 21 %
Lymphs Abs: 2.3 10*3/uL (ref 0.7–4.0)
MCH: 27.3 pg (ref 26.0–34.0)
MCHC: 31.8 g/dL (ref 30.0–36.0)
MCV: 85.8 fL (ref 80.0–100.0)
Monocytes Absolute: 0.8 10*3/uL (ref 0.1–1.0)
Monocytes Relative: 8 %
Neutro Abs: 7.8 10*3/uL — ABNORMAL HIGH (ref 1.7–7.7)
Neutrophils Relative %: 71 %
Platelets: 300 10*3/uL (ref 150–400)
RBC: 3.66 MIL/uL — ABNORMAL LOW (ref 3.87–5.11)
RDW: 13.8 % (ref 11.5–15.5)
WBC: 11 10*3/uL — ABNORMAL HIGH (ref 4.0–10.5)
nRBC: 0 % (ref 0.0–0.2)

## 2019-07-14 MED ORDER — OXYCODONE-ACETAMINOPHEN 5-325 MG PO TABS
1.0000 | ORAL_TABLET | ORAL | Status: DC | PRN
  Administered 2019-07-14: 1 via ORAL
  Filled 2019-07-14: qty 1

## 2019-07-14 MED ORDER — SODIUM CHLORIDE 0.9% FLUSH
3.0000 mL | Freq: Once | INTRAVENOUS | Status: AC
  Administered 2019-07-14: 3 mL via INTRAVENOUS

## 2019-07-14 MED ORDER — MORPHINE SULFATE (PF) 4 MG/ML IV SOLN
4.0000 mg | Freq: Once | INTRAVENOUS | Status: AC
  Administered 2019-07-14: 4 mg via INTRAVENOUS
  Filled 2019-07-14: qty 1

## 2019-07-14 MED ORDER — SODIUM CHLORIDE 0.9 % IV SOLN
1.0000 g | Freq: Once | INTRAVENOUS | Status: AC
  Administered 2019-07-14: 1 g via INTRAVENOUS
  Filled 2019-07-14: qty 10

## 2019-07-14 MED ORDER — OXYCODONE-ACETAMINOPHEN 5-325 MG PO TABS: 1.0000 | ORAL_TABLET | Freq: Three times a day (TID) | ORAL | 0 refills | Status: DC | PRN

## 2019-07-14 MED ORDER — HYDROMORPHONE HCL 1 MG/ML IJ SOLN
1.0000 mg | Freq: Once | INTRAMUSCULAR | Status: AC
  Administered 2019-07-14: 1 mg via INTRAVENOUS
  Filled 2019-07-14: qty 1

## 2019-07-14 MED ORDER — CIPROFLOXACIN HCL 500 MG PO TABS: 500.0000 mg | ORAL_TABLET | Freq: Two times a day (BID) | ORAL | 0 refills | Status: AC

## 2019-07-14 NOTE — ED Triage Notes (Signed)
Pt c/o lower abd pain with passing blood clots in her urine since last night. Denies hx of same.

## 2019-07-14 NOTE — ED Provider Notes (Signed)
[facility] Emergency Department Provider Note       Time seen: ----------------------------------------- 3:08 PM on 07/14/2019 -----------------------------------------   I have reviewed the triage vital signs and the nursing notes.  HISTORY   Chief Complaint Hematuria    HPI Jean Gomez is a 63 y.o. female with a history of allergies, asthma, chronic migraine, COPD, chronic respiratory failure, hypertension, sarcoidosis who presents to the ED for lower abdominal pain with hematuria and passing large blood clots in her urine since last night.  She denies any history of same.  She does have 10 out of 10 lower abdominal pain.  She has had a fever several days ago but none currently.  Past Medical History:  Diagnosis Date  . Acute postoperative pain 01/16/2019  . Allergy   . Asthma   . Cerebral aneurysm   . Chest pain 11/01/2016  . Chronic migraine   . Chronic respiratory failure ([place])   . COPD (chronic obstructive pulmonary disease) ([place])   . Hypertension   . Osteoarthritis   . Pneumonia    12/2018  . Sarcoid   . Seizures [facility])     Patient Active Problem List   Diagnosis Date Noted  . Lumbar foraminal stenosis 07/10/2019  . Abnormal MRI, lumbar spine 07/03/2019  . Disturbance of skin sensation 06/16/2019  . Chronic low back pain (Secondary Area of Pain) (Bilateral) (R>L) w/ sciatica 01/16/2019  . Acute on chronic respiratory failure with hypoxia ([place]) 12/18/2018  . Cervicogenic headache 10/21/2018  . Lumbar spondylosis 06/25/2018  . History of allergy to radiographic contrast media 05/30/2018  . Bipolar affective disorder, current episode manic ([employer]) 04/17/2018  . Chronic hip pain [facility] Area of Pain) (Bilateral) (R>L) 02/26/2018  . DDD (degenerative disc disease), cervical 02/26/2018  . Cervical facet arthropathy (Bilateral) 02/26/2018  . Cervical facet syndrome (Bilateral) (R>L) 02/26/2018  . Cervical Grade 1 Anterolisthesis of  C4 over C5 (Degenerative) 02/26/2018  . Cervical Grade 1 Retrolisthesis of C5 over C6 02/26/2018  . Cervical foraminal stenosis (C3-4, C4-5, and C5-6) (Bilateral) 02/26/2018  . DDD (degenerative disc disease), lumbosacral 02/26/2018  . Osteoarthritis of lumbar spine 02/26/2018  . Osteoarthritis of facet joint of lumbar spine 02/26/2018  . Spondylosis without myelopathy or radiculopathy, cervicothoracic region 02/26/2018  . Occipital headache (Right) 02/25/2018  . Frontal headache (Left) 02/25/2018  . Chronic low back pain (Midline) (Secondary Area of Pain) 02/25/2018  . Chronic lumbar radiculopathy (Right) 02/25/2018  . Osteoarthritis involving multiple joints 02/25/2018  . Osteoarthritis of shoulder (Bilateral) 02/25/2018  . Chronic musculoskeletal pain 02/25/2018  . Lumbar facet syndrome (Bilateral) (R>L) 02/25/2018  . Vitamin D deficiency 02/14/2018  . Chronic pain syndrome 02/07/2018  . Pharmacologic therapy 02/07/2018  . Disorder of skeletal system 02/07/2018  . Problems influencing health status 02/07/2018  . Long term current use of opiate analgesic 02/07/2018  . Chronic low back pain (Secondary Area of Pain) (Bilateral) (R>L) w/ sciatica (Right) 02/07/2018  . Chronic lower extremity pain [facility] Area of Pain) (Bilateral) (R>L) 02/07/2018  . Chronic knee pain (Fourth Area of Pain) (Bilateral) (R>L) 02/07/2018  . Chronic shoulder pain (Fifth Area of Pain) (Bilateral) (R>L) 02/07/2018  . Occipital neuralgia (Primary Area of Pain) (Bilateral) (R>L) 02/07/2018  . Chronic neck pain (Bilateral) (R>L) 02/07/2018  . Chronic sacroiliac joint pain (Right) 02/07/2018  . H/O aneurysm 01/30/2018  . History of depression 01/30/2018  . History of seizure disorder 01/30/2018  . Chronic headache disorder (Primary Area of Pain) 01/30/2018  . Chronic  migraine without aura 01/17/2018  . Chronic respiratory failure with hypoxia (HCC) 01/17/2018  . Constipation 01/17/2018  . HTN, goal below  140/90 01/17/2018  . Hypercholesterolemia 01/17/2018  . Major depression in remission (HCC) 01/17/2018  . COPD (chronic obstructive pulmonary disease) (HCC) 01/17/2018  . Sarcoidosis 01/17/2018  . Seizure disorder (HCC) 01/17/2018  . Nonruptured cerebral aneurysm 10/22/2017  . Obstructive sleep apnea on CPAP 09/11/2017  . Generalized anxiety disorder 01/26/2017  . Irritable bowel syndrome 01/26/2017  . Pulmonary emphysema (HCC) 01/26/2017  . GERD (gastroesophageal reflux disease) 08/02/2016  . Hiatal hernia 08/02/2016  . Essential tremor 07/19/2016  . Muscle spasm 10/05/2015  . Spondylosis without myelopathy or radiculopathy, lumbosacral region 10/05/2015  . Rotator cuff injury 09/17/2015  . Chronic shoulder pain (Right) 09/17/2015  . Epilepsy (HCC) 07/14/2015  . Insomnia 07/14/2015  . Osteoarthritis of knee (Bilateral) (R>L) 06/24/2015    Past Surgical History:  Procedure Laterality Date  . ABDOMINAL HYSTERECTOMY    . CHOLECYSTECTOMY    . SPINAL FUSION      Allergies Contrast media [iodinated diagnostic agents], Gabapentin, Labetalol, Sulfabenzamide, Fentanyl, and Penicillins  Social History Social History   Tobacco Use  . Smoking status: Never Smoker  . Smokeless tobacco: Never Used  Substance Use Topics  . Alcohol use: Yes    Comment: occasional  . Drug use: No    Review of Systems Constitutional: Positive for recent fever Cardiovascular: Negative for chest pain. Respiratory: Negative for shortness of breath. Gastrointestinal: Positive for abdominal pain Genitourinary: Positive for hematuria Musculoskeletal: Negative for back pain. Skin: Negative for rash. Neurological: Negative for headaches, focal weakness or numbness.  All systems negative/normal/unremarkable except as stated in the HPI  ____________________________________________   PHYSICAL EXAM:  VITAL SIGNS: ED Triage Vitals  Enc Vitals Group     BP 07/14/19 1126 123/80     Pulse Rate  07/14/19 1126 (!) 116     Resp 07/14/19 1126 (!) 24     Temp 07/14/19 1126 100.3 F (37.9 C)     Temp Source 07/14/19 1126 Oral     SpO2 07/14/19 1126 100 %     Weight 07/14/19 1127 160 lb (72.6 kg)     Height 07/14/19 1127 5\' 3"  (1.6 m)     Head Circumference --      Peak Flow --      Pain Score 07/14/19 1134 10     Pain Loc --      Pain Edu? --      Excl. in GC? --    Constitutional: Alert and oriented. Well appearing and in no distress. Eyes: Conjunctivae are normal. Normal extraocular movements. ENT      Head: Normocephalic and atraumatic.      Nose: No congestion/rhinnorhea.      Mouth/Throat: Mucous membranes are moist.      Neck: No stridor. Cardiovascular: Normal rate, regular rhythm. No murmurs, rubs, or gallops. Respiratory: Normal respiratory effort without tachypnea nor retractions. Breath sounds are clear and equal bilaterally. No wheezes/rales/rhonchi. Gastrointestinal: Suprapubic tenderness, normal bowel sounds Musculoskeletal: Nontender with normal range of motion in extremities. No lower extremity tenderness nor edema. Neurologic:  Normal speech and language. No gross focal neurologic deficits are appreciated.  Skin:  Skin is warm, dry and intact. No rash noted. Psychiatric: Mood and affect are normal. Speech and behavior are normal.  ____________________________________________  ED COURSE:  As part of my medical decision making, I reviewed the following data within the electronic MEDICAL RECORD NUMBER History obtained  from family if available, nursing notes, old chart and ekg, as well as notes from prior ED visits. Patient presented for abdominal pain, we will assess with labs and imaging as indicated at this time.   Procedures  Channie Tyrone AppleFaye Harner was evaluated in Emergency Department on 07/14/2019 for the symptoms described in the history of present illness. She was evaluated in the context of the global COVID-19 pandemic, which necessitated consideration that the  patient might be at risk for infection with the SARS-CoV-2 virus that causes COVID-19. Institutional protocols and algorithms that pertain to the evaluation of patients at risk for COVID-19 are in a state of rapid change based on information released by regulatory bodies including the CDC and federal and state organizations. These policies and algorithms were followed during the patient's care in the ED.  ____________________________________________   LABS (pertinent positives/negatives)  Labs Reviewed  URINALYSIS, COMPLETE (UACMP) WITH MICROSCOPIC - Abnormal; Notable for the following components:      Result Value   Color, Urine YELLOW (*)    APPearance CLOUDY (*)    Hgb urine dipstick LARGE (*)    Protein, ur 100 (*)    Leukocytes,Ua LARGE (*)    RBC / HPF >50 (*)    WBC, UA >50 (*)    All other components within normal limits  CBC WITH DIFFERENTIAL/PLATELET - Abnormal; Notable for the following components:   WBC 11.0 (*)    RBC 3.66 (*)    Hemoglobin 10.0 (*)    HCT 31.4 (*)    Neutro Abs 7.8 (*)    All other components within normal limits  URINE CULTURE  COMPREHENSIVE METABOLIC PANEL  CBC WITH DIFFERENTIAL/PLATELET  CBC WITH DIFFERENTIAL/PLATELET    RADIOLOGY Images were viewed by me  CT renal protocol IMPRESSION: 3 mm calcification anterior to the iliac vessels in the right, probable ureteral calculus versus phlebolith. Mild fullness of the right renal pelvis. No other renal calculi. ____________________________________________   DIFFERENTIAL DIAGNOSIS   UTI, pyelonephritis, renal colic, bladder CA  FINAL ASSESSMENT AND PLAN  Cystitis, possible renal colic   Plan: The patient had presented for hematuria. Patient's labs did reveal significant red cells and white cells in her urine with no bacteria.  We have sent for a urine culture and she received IV Rocephin here. Patient's imaging revealed possible renal colic versus phlebolith.  She will be referred to urology  for close outpatient follow-up.   Ulice DashJohnathan E Terriann Difonzo, MD    Note: This note was generated in part or whole with voice recognition software. Voice recognition is usually quite accurate but there are transcription errors that can and very often do occur. I apologize for any typographical errors that were not detected and corrected.     Emily FilbertWilliams, Cora Brierley E, MD 07/14/19 [id number]

## 2019-07-14 NOTE — ED Notes (Signed)
Patient transported to CT 

## 2019-07-15 ENCOUNTER — Other Ambulatory Visit: Payer: Self-pay | Admitting: Gastroenterology

## 2019-07-15 DIAGNOSIS — R1310 Dysphagia, unspecified: Secondary | ICD-10-CM

## 2019-07-16 LAB — URINE CULTURE: Culture: NO GROWTH

## 2019-07-18 ENCOUNTER — Ambulatory Visit: Admission: RE | Admit: 2019-07-18 | Payer: Medicare Other | Source: Ambulatory Visit

## 2019-08-15 ENCOUNTER — Other Ambulatory Visit
Admission: RE | Admit: 2019-08-15 | Discharge: 2019-08-15 | Disposition: A | Discharge status: Home / Self Care | Payer: Medicare Other | Source: Ambulatory Visit | Attending: Gastroenterology | Admitting: Gastroenterology

## 2019-08-15 ENCOUNTER — Other Ambulatory Visit: Payer: Self-pay

## 2019-08-15 DIAGNOSIS — Z20828 Contact with and (suspected) exposure to other viral communicable diseases: Secondary | ICD-10-CM | POA: Insufficient documentation

## 2019-08-15 DIAGNOSIS — Z01812 Encounter for preprocedural laboratory examination: Secondary | ICD-10-CM | POA: Diagnosis present

## 2019-08-16 LAB — SARS CORONAVIRUS 2 (TAT 6-24 HRS): SARS Coronavirus 2: NEGATIVE

## 2019-08-18 ENCOUNTER — Encounter: Payer: Self-pay | Admitting: *Deleted

## 2019-08-19 ENCOUNTER — Other Ambulatory Visit: Payer: Self-pay

## 2019-08-19 ENCOUNTER — Encounter
Admission: RE | Disposition: A | Discharge status: Home / Self Care | Payer: Self-pay | Source: Home / Self Care | Attending: Gastroenterology

## 2019-08-19 ENCOUNTER — Ambulatory Visit
Admission: RE | Admit: 2019-08-19 | Discharge: 2019-08-19 | Disposition: A | Discharge status: Home / Self Care | Payer: Medicare Other | Attending: Gastroenterology | Admitting: Gastroenterology

## 2019-08-19 ENCOUNTER — Ambulatory Visit: Payer: Medicare Other | Admitting: Anesthesiology

## 2019-08-19 ENCOUNTER — Encounter: Payer: Self-pay | Admitting: Gastroenterology

## 2019-08-19 DIAGNOSIS — Z7951 Long term (current) use of inhaled steroids: Secondary | ICD-10-CM | POA: Diagnosis not present

## 2019-08-19 DIAGNOSIS — J449 Chronic obstructive pulmonary disease, unspecified: Secondary | ICD-10-CM | POA: Insufficient documentation

## 2019-08-19 DIAGNOSIS — Z79899 Other long term (current) drug therapy: Secondary | ICD-10-CM | POA: Diagnosis not present

## 2019-08-19 DIAGNOSIS — Z7982 Long term (current) use of aspirin: Secondary | ICD-10-CM | POA: Insufficient documentation

## 2019-08-19 DIAGNOSIS — Z86010 Personal history of colonic polyps: Secondary | ICD-10-CM | POA: Insufficient documentation

## 2019-08-19 DIAGNOSIS — F329 Major depressive disorder, single episode, unspecified: Secondary | ICD-10-CM | POA: Insufficient documentation

## 2019-08-19 DIAGNOSIS — Z1211 Encounter for screening for malignant neoplasm of colon: Secondary | ICD-10-CM | POA: Diagnosis not present

## 2019-08-19 DIAGNOSIS — Q438 Other specified congenital malformations of intestine: Secondary | ICD-10-CM | POA: Diagnosis not present

## 2019-08-19 DIAGNOSIS — Z888 Allergy status to other drugs, medicaments and biological substances status: Secondary | ICD-10-CM | POA: Diagnosis not present

## 2019-08-19 DIAGNOSIS — I10 Essential (primary) hypertension: Secondary | ICD-10-CM | POA: Diagnosis not present

## 2019-08-19 DIAGNOSIS — Z880 Allergy status to penicillin: Secondary | ICD-10-CM | POA: Diagnosis not present

## 2019-08-19 DIAGNOSIS — Z91041 Radiographic dye allergy status: Secondary | ICD-10-CM | POA: Diagnosis not present

## 2019-08-19 DIAGNOSIS — M1990 Unspecified osteoarthritis, unspecified site: Secondary | ICD-10-CM | POA: Insufficient documentation

## 2019-08-19 HISTORY — PX: ESOPHAGOGASTRODUODENOSCOPY (EGD) WITH PROPOFOL: SHX5813

## 2019-08-19 HISTORY — DX: Depression, unspecified: F32.A

## 2019-08-19 HISTORY — PX: COLONOSCOPY WITH PROPOFOL: SHX5780

## 2019-08-19 SURGERY — ESOPHAGOGASTRODUODENOSCOPY (EGD) WITH PROPOFOL
Anesthesia: General

## 2019-08-19 MED ORDER — SODIUM CHLORIDE 0.9 % IV SOLN: INTRAVENOUS | Status: DC

## 2019-08-19 MED ORDER — SODIUM CHLORIDE 0.9 % IV SOLN
INTRAVENOUS | Status: DC
  Administered 2019-08-19: 08:00:00 via INTRAVENOUS

## 2019-08-19 MED ORDER — PROPOFOL 10 MG/ML IV BOLUS
INTRAVENOUS | Status: AC
  Filled 2019-08-19: qty 80

## 2019-08-19 MED ORDER — PROPOFOL 10 MG/ML IV BOLUS
INTRAVENOUS | Status: DC | PRN
  Administered 2019-08-19: 20 mg via INTRAVENOUS
  Administered 2019-08-19 (×2): 30 mg via INTRAVENOUS
  Administered 2019-08-19: 90 mg via INTRAVENOUS
  Administered 2019-08-19: 100 mg via INTRAVENOUS
  Administered 2019-08-19: 60 mg via INTRAVENOUS
  Administered 2019-08-19: 20 mg via INTRAVENOUS
  Administered 2019-08-19: 40 mg via INTRAVENOUS
  Administered 2019-08-19: 60 mg via INTRAVENOUS
  Administered 2019-08-19: 20 mg via INTRAVENOUS
  Administered 2019-08-19: 30 mg via INTRAVENOUS
  Administered 2019-08-19: 40 mg via INTRAVENOUS
  Administered 2019-08-19 (×2): 50 mg via INTRAVENOUS
  Administered 2019-08-19: 20 mg via INTRAVENOUS

## 2019-08-19 NOTE — Anesthesia Postprocedure Evaluation (Signed)
Anesthesia Post Note  Patient: Jean Gomez  Procedure(s) Performed: ESOPHAGOGASTRODUODENOSCOPY (EGD) WITH PROPOFOL (N/A ) COLONOSCOPY WITH PROPOFOL (N/A )  Patient location during evaluation: Endoscopy Anesthesia Type: General Level of consciousness: awake and alert Pain management: pain level controlled Vital Signs Assessment: post-procedure vital signs reviewed and stable Respiratory status: spontaneous breathing and respiratory function stable Cardiovascular status: stable Anesthetic complications: no     Last Vitals:  Vitals:   08/19/19 0858 08/19/19 0908  BP: (!) 156/92 (!) 168/92  Pulse:    Resp:    Temp:    SpO2:      Last Pain:  Vitals:   08/19/19 0908  TempSrc:   PainSc: 0-No pain                 [name] K

## 2019-08-19 NOTE — Anesthesia Post-op Follow-up Note (Signed)
Anesthesia QCDR form completed.        

## 2019-08-19 NOTE — Op Note (Addendum)
[facility] Gastroenterology Patient Name: Jean LootsCatheryn Longnecker Procedure Date: 08/19/2019 7:29 AM MRN: [id number] Account #: [id number] Date of Birth: [birth year] Admit Type: Outpatient Age: 63 [facility] Gender: Female Note Status: Finalized Procedure:            Colonoscopy Indications:          Personal history of colonic polyps Providers:            Christena DeemMartin U. Skulskie, MD Referring MD:         Silas FloodSheikh A. Ellsworth Lennoxejan-sie, MD (Referring MD) Medicines:            Monitored Anesthesia Care Complications:        No immediate complications. Procedure:            Pre-Anesthesia Assessment:                       - ASA Grade Assessment: III - A patient with severe                        systemic disease.                       After obtaining informed consent, the colonoscope was                        passed under direct vision. Throughout the procedure,                        the patient's blood pressure, pulse, and oxygen                        saturations were monitored continuously. The                        Colonoscope was introduced through the anus and                        advanced to the the cecum, identified by appendiceal                        orifice and ileocecal valve. The colonoscopy was                        performed without difficulty. The patient tolerated the                        procedure well. The quality of the bowel preparation                        was good except the ascending colon was fair. Findings:      The sigmoid colon, descending colon and transverse colon were       significantly redundant.      The exam was otherwise normal throughout the examined colon.      The digital rectal exam was normal. Impression:           - Redundant colon.                       - No specimens collected. Recommendation:       - Discharge patient to home.                       -  Advance diet as tolerated.                       - Repeat colonoscopy  in 5 years for adenoma                        surveillance. Procedure Code(s):    --- Professional ---                       [phone], Colonoscopy, flexible; diagnostic, including                        collection of specimen(s) by brushing or washing, when                        performed (separate procedure) Diagnosis Code(s):    --- Professional ---                       Z86.010, Personal history of colonic polyps                       Q43.8, Other specified congenital malformations of                        intestine CPT copyright 2019 American Medical Association. All rights reserved. The codes documented in this report are preliminary and upon coder review may  be revised to meet current compliance requirements. Lollie Sails, MD 08/19/2019 8:38:28 AM This report has been signed electronically. Number of Addenda: 0 Note Initiated On: 08/19/2019 7:29 AM Scope Withdrawal Time: 0 hours 13 minutes 41 seconds  Total Procedure Duration: 0 hours 29 minutes 7 seconds       [facility]

## 2019-08-19 NOTE — Anesthesia Preprocedure Evaluation (Signed)
Anesthesia Evaluation  Patient identified by MRN, date of birth, ID band Patient awake    Reviewed: Allergy & Precautions, NPO status , Patient's Chart, lab work & pertinent test results  History of Anesthesia Complications Negative for: history of anesthetic complications  Airway Mallampati: III       Dental  (+) Upper Dentures, Lower Dentures   Pulmonary asthma , sleep apnea, Continuous Positive Airway Pressure Ventilation and Oxygen sleep apnea , COPD,  COPD inhaler and oxygen dependent, Not current smoker,           Cardiovascular hypertension, Pt. on medications (-) Past MI and (-) CHF + dysrhythmias (occassional palpitations) (-) Valvular Problems/Murmurs     Neuro/Psych Seizures - (none in last 4 years), Well Controlled,  Anxiety Depression Bipolar Disorder    GI/Hepatic Neg liver ROS, hiatal hernia, GERD  Medicated,  Endo/Other  neg diabetes  Renal/GU Hematuria, not diagnosed     Musculoskeletal   Abdominal   Peds  Hematology   Anesthesia Other Findings   Reproductive/Obstetrics                             Anesthesia Physical Anesthesia Plan  ASA: III  Anesthesia Plan: General   Post-op Pain Management:    Induction: Intravenous  PONV Risk Score and Plan: 3 and Propofol infusion, TIVA and Midazolam  Airway Management Planned: Nasal Cannula  Additional Equipment:   Intra-op Plan:   Post-operative Plan:   Informed Consent: I have reviewed the patients History and Physical, chart, labs and discussed the procedure including the risks, benefits and alternatives for the proposed anesthesia with the patient or authorized representative who has indicated his/her understanding and acceptance.       Plan Discussed with:   Anesthesia Plan Comments:         Anesthesia Quick Evaluation

## 2019-08-19 NOTE — Transfer of Care (Signed)
Immediate Anesthesia Transfer of Care Note  Patient: Jean Gomez  Procedure(s) Performed: ESOPHAGOGASTRODUODENOSCOPY (EGD) WITH PROPOFOL (N/A ) COLONOSCOPY WITH PROPOFOL (N/A )  Patient Location: Endoscopy Unit  Anesthesia Type:General  Level of Consciousness: awake, alert  and oriented  Airway & Oxygen Therapy: Patient Spontanous Breathing and Patient connected to nasal cannula oxygen  Post-op Assessment: Report given to RN and Post -op Vital signs reviewed and stable  Post vital signs: Reviewed and stable  Last Vitals:  Vitals Value Taken Time  BP 127/93 08/19/19 0838  Temp    Pulse 95 08/19/19 0838  Resp 18 08/19/19 0838  SpO2 100 % 08/19/19 0838  Vitals shown include unvalidated device data.  Last Pain:  Vitals:   08/19/19 0708  TempSrc: Tympanic  PainSc: 8          Complications: No apparent anesthesia complications

## 2019-08-19 NOTE — H&P (Signed)
Outpatient short stay form Pre-procedure 08/19/2019 7:44 AM Jean DeemMartin U Harlan Vinal MD  Primary Physician: Dr Bluford MainSheikh Tejan Gomez  Reason for visit: Colonoscopy  History of present illness: Patient is a 63 year old female presenting today for colonoscopy in regards to her personal history of colon polyps and chronic constipation.  She was also arranged initially to have a EGD however did not have her radiology study beforehand and were having to schedule this.  He states she tolerated her prep well.  She denies use of any aspirin or blood thinning agent.  She has had chronic constipation for some time and may go 4 to 5 days before between bowel movements.    Current Facility-Administered Medications:  .  0.9 %  sodium chloride infusion, , Intravenous, Continuous, Jean DeemSkulskie, Castiel Lauricella U, MD .  0.9 %  sodium chloride infusion, , Intravenous, Continuous, Jean DeemSkulskie, Levie Wages U, MD  Medications Prior to Admission  Medication Sig Dispense Refill Last Dose  . albuterol (PROVENTIL) (2.5 MG/3ML) 0.083% nebulizer solution Take 2.5 mg by nebulization every 6 (six) hours as needed for wheezing or shortness of breath.   08/18/2019 at Unknown time  . Albuterol Sulfate 108 (90 Base) MCG/ACT AEPB Inhale 2 puffs into the lungs every 6 (six) hours as needed.   08/18/2019 at Unknown time  . ALPRAZolam (XANAX) 0.25 MG tablet    Past Week at Unknown time  . aspirin EC 81 MG tablet Take 1 tablet (81 mg total) by mouth daily. 30 tablet 0 Past Week at Unknown time  . Aspirin-Calcium Carbonate 81-777 MG TABS Take by mouth.   Past Week at Unknown time  . atorvastatin (LIPITOR) 20 MG tablet Take 20 mg by mouth daily.   08/18/2019 at Unknown time  . betamethasone, augmented, (DIPROLENE) 0.05 % lotion Apply topically 2 (two) times daily.   Past Week at Unknown time  . budesonide-formoterol (SYMBICORT) 160-4.5 MCG/ACT inhaler Inhale into the lungs.   Past Week at Unknown time  . busPIRone (BUSPAR) 15 MG tablet    Past Week at Unknown time   . calcium carbonate (OS-CAL) 1250 (500 Ca) MG chewable tablet Chew by mouth daily.   Past Week at Unknown time  . citalopram (CELEXA) 20 MG tablet Take 20 mg by mouth daily.   Past Week at Unknown time  . clotrimazole-betamethasone (LOTRISONE) cream Apply 1 application topically 2 (two) times daily.   Past Week at Unknown time  . dexlansoprazole (DEXILANT) 60 MG capsule Take 60 mg by mouth daily.   Past Week at Unknown time  . diclofenac sodium (VOLTAREN) 1 % GEL Apply 4 g topically 4 (four) times daily. 350 g 4 Past Week at Unknown time  . doxycycline (PERIOSTAT) 20 MG tablet    Past Week at Unknown time  . Elastic Bandages & Supports (MEDICAL COMPRESSION STOCKINGS) MISC Please provide 15mmHg compression stockings 1 each 0 Past Week at Unknown time  . hydrochlorothiazide (HYDRODIURIL) 25 MG tablet Take 1 tablet (25 mg total) by mouth daily. 30 tablet 0 08/19/2019 at 0630  . HYDROcodone-acetaminophen (NORCO) 10-325 MG tablet Take 1 tablet by mouth every 6 (six) hours as needed for up to 30 days for severe pain. Must last 30 days 120 tablet 0 Past Week at Unknown time  . hydrOXYzine (ATARAX/VISTARIL) 10 MG tablet Take 10 mg by mouth.   Past Week at Unknown time  . ipratropium (ATROVENT) 0.02 % nebulizer solution Take 0.5 mg by nebulization every 4 (four) hours as needed for wheezing or shortness of breath.  Past Week at Unknown time  . lactulose (CHRONULAC) 10 GM/15ML solution Take 10 g by mouth 3 (three) times daily.   Past Week at Unknown time  . levETIRAcetam (KEPPRA) 500 MG tablet Take 500 mg by mouth 2 (two) times daily.   Past Week at Unknown time  . lidocaine (XYLOCAINE) 5 % ointment Apply 1 application topically 4 (four) times daily as needed for moderate pain. 1 application = 6 in. of ointment = 5 g (Max: 20 g/day) 35.44 g 2 Past Week at Unknown time  . linaclotide (LINZESS) 145 MCG CAPS capsule Take 145 mcg by mouth daily before breakfast.   Past Week at Unknown time  . mirtazapine (REMERON)  15 MG tablet    Past Week at Unknown time  . nitroGLYCERIN (NITROSTAT) 0.4 MG SL tablet Place 0.4 mg under the tongue every 5 (five) minutes as needed for chest pain.   Past Week at Unknown time  . olmesartan (BENICAR) 20 MG tablet    Past Week at Unknown time  . ondansetron (ZOFRAN) 4 MG tablet Take 4 mg by mouth every 8 (eight) hours as needed for nausea or vomiting.   Past Week at Unknown time  . promethazine (PHENERGAN) 25 MG tablet Take 25 mg by mouth every 6 (six) hours as needed for nausea or vomiting.   Past Week at Unknown time  . QUEtiapine (SEROQUEL) 100 MG tablet Take 100 mg by mouth at bedtime.   Past Week at Unknown time  . sucralfate (CARAFATE) 1 g tablet Take 1 tablet (1 g total) by mouth 4 (four) times daily. 120 tablet 0 Past Week at Unknown time  . tiZANidine (ZANAFLEX) 4 MG tablet Take 1 tablet (4 mg total) by mouth 3 (three) times daily. 90 tablet 2 Past Week at Unknown time  . torsemide (DEMADEX) 20 MG tablet Take 1 tablet (20 mg total) by mouth daily. 5 tablet 0 Past Week at Unknown time  . traZODone (DESYREL) 50 MG tablet Take 50 mg by mouth at bedtime.   Past Week at Unknown time  . amLODipine (NORVASC) 10 MG tablet Take 1 tablet (10 mg total) by mouth daily. 30 tablet 0   . clotrimazole-betamethasone (LOTRISONE) cream Apply 1 application topically 2 (two) times daily. 30 g 0   . diphenhydrAMINE (BENADRYL) 25 MG tablet Take 25 mg by mouth every 6 (six) hours as needed.     Marland Kitchen HYDROcodone-acetaminophen (NORCO) 10-325 MG tablet Take 1 tablet by mouth every 6 (six) hours as needed for up to 30 days for severe pain. Must last 30 days 120 tablet 0   . HYDROcodone-acetaminophen (NORCO) 10-325 MG tablet Take 1 tablet by mouth every 6 (six) hours as needed for up to 30 days for severe pain. Must last 30 days 120 tablet 0   . losartan (COZAAR) 25 MG tablet Take 1 tablet (25 mg total) by mouth daily. 30 tablet 0   . oxyCODONE-acetaminophen (PERCOCET) 5-325 MG tablet Take 1 tablet by  mouth every 8 (eight) hours as needed. 20 tablet 0   . pantoprazole (PROTONIX) 40 MG tablet Take 1 tablet (40 mg total) by mouth daily. 30 tablet 0   . potassium chloride SA (K-DUR,KLOR-CON) 20 MEQ tablet Take 1 tablet (20 mEq total) by mouth 2 (two) times daily. 30 tablet 0   . QUEtiapine (SEROQUEL) 400 MG tablet Take 400 mg by mouth at bedtime.     Marland Kitchen venlafaxine (EFFEXOR) 75 MG tablet TK 1 T PO BID  3  Allergies  Allergen Reactions  . Contrast Media [Iodinated Diagnostic Agents] Swelling  . Gabapentin   . Labetalol Other (See Comments)    Made hair fall out  . Sulfabenzamide Nausea Only  . Fentanyl Rash  . Penicillins Rash     Past Medical History:  Diagnosis Date  . Acute postoperative pain 01/16/2019  . Allergy   . Asthma   . Cerebral aneurysm   . Chest pain 11/01/2016  . Chronic migraine   . Chronic respiratory failure (HCC)   . COPD (chronic obstructive pulmonary disease) (HCC)   . Depression    major depression in remission  . Hypertension   . Osteoarthritis   . Pneumonia    12/2018  . Sarcoid   . Seizures (HCC)     Review of systems:      Physical Exam    Heart and lungs: Regular rate and rhythm without rub or gallop lungs are bilaterally clear    HEENT: Normocephalic atraumatic eyes are anicteric    Other:    Pertinant exam for procedure: Soft nontender nondistended bowel sounds positive normoactive    Planned proceedures: Colonoscopy and indicated procedures. I have discussed the risks benefits and complications of procedures to include not limited to bleeding, infection, perforation and the risk of sedation and the patient wishes to proceed.    Jean Deem, MD Gastroenterology 08/19/2019  7:44 AM

## 2019-08-22 ENCOUNTER — Ambulatory Visit: Payer: Medicare Other | Attending: Gastroenterology

## 2019-09-02 ENCOUNTER — Other Ambulatory Visit: Admission: RE | Admit: 2019-09-02 | Payer: Medicare Other | Source: Ambulatory Visit

## 2019-09-02 NOTE — Progress Notes (Addendum)
Patient's Name: Jean Gomez  MRN: [id number]  Referring Provider: Jodi Marble, MD  DOB: 04-05-1956  PCP: Jodi Marble, MD  DOS: 09/03/2019  Note by: Gaspar Cola, MD  Service setting: Ambulatory outpatient  Attending: Gaspar Cola, MD  Location: [facility] (AMB) Pain Management Facility  Specialty: Interventional Pain Management  Patient type: Established   Primary Reason(s) for Visit: Encounter for prescription drug management. (Level of risk: moderate)  CC: Back Pain (lower)  HPI  Jean Gomez is a 63 y.o. year old, female patient, who comes today for a medication management evaluation. She has Chronic pain syndrome; Pharmacologic therapy; Disorder of skeletal system; Problems influencing health status; Long term current use of opiate analgesic; Chronic low back pain (Secondary Area of Pain) (Bilateral) (R>L) w/ sciatica (Right); Chronic lower extremity pain (Tertiary Area of Pain) (Bilateral) (R>L); Chronic knee pain (Fourth Area of Pain) (Bilateral) (R>L); Chronic shoulder pain (Fifth Area of Pain) (Bilateral) (R>L); Occipital neuralgia (Primary Area of Pain) (Bilateral) (R>L); Chronic neck pain (Bilateral) (R>L); Chronic sacroiliac joint pain (Right); Chronic migraine without aura; Chronic respiratory failure with hypoxia ([place]); Constipation; Epilepsy ([place]); HTN, goal below 140/90; Essential tremor; Generalized anxiety disorder; H/O aneurysm; GERD (gastroesophageal reflux disease); History of depression; History of seizure disorder; Hypercholesterolemia; Insomnia; Irritable bowel syndrome; Major depression in remission ([place]); Muscle spasm; Obstructive sleep apnea on CPAP; Osteoarthritis of knee (Bilateral) (R>L); COPD (chronic obstructive pulmonary disease) ([place]); Rotator cuff injury; Sarcoidosis; Spondylosis without myelopathy or radiculopathy, lumbosacral region; Vitamin D deficiency; Chronic shoulder pain (Right); Nonruptured cerebral aneurysm; Chronic headache disorder  (Primary Area of Pain); Pulmonary emphysema ([facility]); Seizure disorder ([place]); Hiatal hernia; Occipital headache (Right); Frontal headache (Left); Chronic low back pain (Midline) (Secondary Area of Pain); Chronic lumbar radiculopathy (Right); Osteoarthritis involving multiple joints; Osteoarthritis of shoulder (Bilateral); Chronic musculoskeletal pain; Lumbar facet syndrome (Bilateral) (R>L); Chronic hip pain (Tertiary Area of Pain) (Bilateral) (R>L); DDD (degenerative disc disease), cervical; Cervical facet arthropathy (Bilateral); Cervical facet syndrome (Bilateral) (R>L); Cervical Grade 1 Anterolisthesis of C4 over C5 (Degenerative); Cervical Grade 1 Retrolisthesis of C5 over C6; Cervical foraminal stenosis (C3-4, C4-5, and C5-6) (Bilateral); DDD (degenerative disc disease), lumbosacral; Osteoarthritis of lumbar spine; Osteoarthritis of facet joint of lumbar spine; Spondylosis without myelopathy or radiculopathy, cervicothoracic region; Bipolar affective disorder, current episode manic ([place]); History of allergy to radiographic contrast media; Lumbar spondylosis; Cervicogenic headache; Acute on chronic respiratory failure with hypoxia ([place]); Chronic low back pain (Secondary Area of Pain) (Bilateral) (R>L) w/ sciatica; Disturbance of skin sensation; Abnormal MRI, lumbar spine; and Lumbar foraminal stenosis on their problem list. Her primarily concern today is the Back Pain (lower)  Pain Assessment: Location: Right, Left, Lower Back Radiating: right hip and right leg to the foot Onset: More than a month ago Quality: Constant Severity: 10-Worst pain ever/10 (subjective, self-reported pain score)  Note: Reported level is inconsistent with clinical observations. Clinically the patient looks like a 4/10 A 4/10 is viewed as "Moderately Severe" and described as impossible to ignore for more than a few minutes. With effort, patients may still be able to manage work or participate in some social activities. Very  difficult to concentrate. Signs of autonomic nervous system discharge are evident: dilated pupils (mydriasis); mild sweating (diaphoresis); sleep interference. Heart rate becomes elevated (>115 bpm). Diastolic blood pressure (lower number) rises above 100 mmHg. Patients find relief in laying down and not moving. Jean Gomez does not seem to understand the use of our objective pain scale When using our objective Pain  Scale, levels between 6 and 10/10 are said to belong in an emergency room, as it progressively worsens from a 6/10, described as severely limiting, requiring emergency care not usually available at an outpatient pain management facility. At a 6/10 level, communication becomes difficult and requires great effort. Assistance to reach the emergency department may be required. Facial flushing and profuse sweating along with potentially dangerous increases in heart rate and blood pressure will be evident. Timing: Constant Modifying factors: taking pressure of the right hip BP: 98/73  HR: (!) 113  Jean Gomez was last scheduled for an appointment on 07/10/2019 for medication management. During today's appointment we reviewed Jean Gomez's chronic pain status, as well as her outpatient medication regimen.  The patient has already had a lumbar MRI which has demonstrated pathology that could account for some of the patient's pain.  She does have facet arthropathy, as well as right-sided L5-S1 foraminal stenosis and left-sided L4-5 foraminal stenosis with a left-sided L4-5 disc extrusion.  She was referred to the neurosurgeon, Dr. Lacinda Axon, who recommended against a decompression and fusion.  He did recommend a trial of a spinal cord stimulator.  At this point the patient is having some acute symptoms compatible with a lumbar radiculitis/radiculopathy.  We will schedule her to have some palliative/diagnostic epidural steroid injections.  We will also be referring her to the medical psychologist to determine if she  would be a good candidate for a spinal cord stimulator trial.  We will provide the patient with written information regarding the implant.  The patient has indicated that the oxycodone seems to work better than her hydrocodone and therefore she is interested in having this changed.  We will go ahead with a trial of that today.  However, we will consider adding a membrane stabilizer to minimize the use of the opioid analgesics.  We may have to do this at a later time.  The patient  reports no history of drug use. Her body mass index is 27.46 kg/m.  Further details on both, my assessment(s), as well as the proposed treatment plan, please see below.  Controlled Substance Pharmacotherapy Assessment REMS (Risk Evaluation and Mitigation Strategy)  Analgesic: Hydrocodone/acetaminophen 10/325 3 times daily (30 mg/day of Hydrocodone)(enough until 09/16/2019) today we will be switching the patient to oxycodone IR 10 mg 1 tablet p.o. every 8 hours (30 mg/day of oxycodone) MME/day:28m/day.  This will be increased to 45 MME/day by the use of the oxycodone.   WLandis Martins RN  09/03/2019  9:34 AM  Signed Nursing Pain Medication Assessment:  Safety precautions to be maintained throughout the outpatient stay will include: orient to surroundings, keep bed in low position, maintain call bell within reach at all times, provide assistance with transfer out of bed and ambulation.  Medication Inspection Compliance: Pill count conducted under aseptic conditions, in front of the patient. Neither the pills nor the bottle was removed from the patient's sight at any time. Once count was completed pills were immediately returned to the patient in their original bottle.  Medication: Hydrocodone/APAP Pill/Patch Count: 56  of 120 pills remain Pill/Patch Appearance: Markings consistent with prescribed medication Bottle Appearance: Standard pharmacy container. Clearly labeled. Filled Date: 09/02 / 2020 Last Medication  intake:  Yesterday  #55 pills wasted , witnessed by D. Wheatley RN    Pharmacokinetics: Liberation and absorption (onset of action): WNL Distribution (time to peak effect): WNL Metabolism and excretion (duration of action): WNL         Pharmacodynamics: Desired  effects: Analgesia: Ms. Hoque reports >50% benefit. Functional ability: Patient reports that medication allows her to accomplish basic ADLs Clinically meaningful improvement in function (CMIF): Sustained CMIF goals met Perceived effectiveness: Described as relatively effective, allowing for increase in activities of daily living (ADL) Undesirable effects: Side-effects or Adverse reactions: None reported Monitoring: [place] PMP: PDMP reviewed during this encounter. Online review of the past 35-monthperiod conducted. Compliant with practice rules and regulations Last UDS on record: Summary  Date Value Ref Range Status  03/27/2018 FINAL  Final    Comment:    ==================================================================== TOXASSURE SELECT 13 (MW) ==================================================================== Test                             Result       Flag       Units Drug Present and Declared for Prescription Verification   Hydrocodone                    646          EXPECTED   ng/mg creat   Hydromorphone                  367          EXPECTED   ng/mg creat   Dihydrocodeine                 317          EXPECTED   ng/mg creat   Norhydrocodone                 2063         EXPECTED   ng/mg creat    Sources of hydrocodone include scheduled prescription    medications. Hydromorphone, dihydrocodeine and norhydrocodone are    expected metabolites of hydrocodone. Hydromorphone and    dihydrocodeine are also available as scheduled prescription    medications. ==================================================================== Test                      Result    Flag   Units      Ref Range   Creatinine              24                mg/dL      >=20 ==================================================================== Declared Medications:  The flagging and interpretation on this report are based on the  following declared medications.  Unexpected results may arise from  inaccuracies in the declared medications.  **Note: The testing scope of this panel includes these medications:  Hydrocodone (Hydrocodone-Acetaminophen)  **Note: The testing scope of this panel does not include following  reported medications:  Acetaminophen (Hydrocodone-Acetaminophen)  Albuterol  Amlodipine  Aspirin (Aspirin 81)  Atorvastatin  Budenoside (Symbicort)  Clotrimazole (Lotrisone)  Diclofenac  Escitalopram  Formoterol (Symbicort)  Hydrochlorothiazide (HCTZ)  Ipratropium  Ketoconazole  Lactulose  Levetiracetam  Losartan (Losartan Potassium)  Lubiprostone  Mirtazapine  Nitroglycerin  Ondansetron  Pantoprazole  Potassium  Sucralfate  Tizanidine  Topical  Torsemide  Venlafaxine  Vitamin D2 (Ergocalciferol) ==================================================================== For clinical consultation, please call [phone] ====================================================================    UDS interpretation: Compliant          Medication Assessment Form: Reviewed. Patient indicates being compliant with therapy Treatment compliance: Compliant Risk Assessment Profile: Aberrant behavior: See initial evaluations. None observed or detected today Comorbid factors increasing risk of overdose: See initial evaluation. No additional risks  detected today Opioid risk tool (ORT):  Opioid Risk  12/20/2018  Alcohol 0  Illegal Drugs 0  Rx Drugs 0  Alcohol -  Illegal Drugs 0  Rx Drugs 0  Age between 16-45 years  -  History of Preadolescent Sexual Abuse 3  Psychological Disease 0  Depression 0  Opioid Risk Tool Scoring 3  Opioid Risk Interpretation Low Risk    ORT Scoring interpretation table:  Score <3 = Low  Risk for SUD  Score between 4-7 = Moderate Risk for SUD  Score >8 = High Risk for Opioid Abuse   Risk of substance use disorder (SUD): Low  Risk Mitigation Strategies:  Patient Counseling: Covered Patient-Prescriber Agreement (PPA): Present and active  Notification to other healthcare providers: Done  Pharmacologic Plan: No change in therapy, at this time.             Laboratory Chemistry Profile   Screening Lab Results  Component Value Date   SARSCOV2NAA NEGATIVE 08/15/2019   HIV Non Reactive 12/18/2018    Inflammation (CRP: Acute Phase) (ESR: Chronic Phase) Lab Results  Component Value Date   CRP <0.8 02/07/2018   ESRSEDRATE 53 (H) 02/07/2018   LATICACIDVEN 1.6 12/18/2018                         Rheumatology No results found.  Renal Lab Results  Component Value Date   BUN 15 07/14/2019   CREATININE 0.61 07/14/2019   GFRAA >60 07/14/2019   GFRNONAA >60 07/14/2019                             Hepatic Lab Results  Component Value Date   AST 23 07/14/2019   ALT 17 07/14/2019   ALBUMIN 4.9 07/14/2019   ALKPHOS 49 07/14/2019                        Electrolytes Lab Results  Component Value Date   NA 138 07/14/2019   K 3.9 07/14/2019   CL 101 07/14/2019   CALCIUM 9.5 07/14/2019   MG 1.9 12/20/2018                        Neuropathy Lab Results  Component Value Date   VITAMINB12 268 02/07/2018   HGBA1C 5.9 (H) 04/18/2018   HIV Non Reactive 12/18/2018                        CNS Lab Results  Component Value Date   COLORCSF CLEAR (A) 02/18/2018   COLORCSF CLEAR (A) 02/18/2018   APPEARCSF COLORLESS (A) 02/18/2018   APPEARCSF COLORLESS (A) 02/18/2018   RBCCOUNTCSF 2 02/18/2018   RBCCOUNTCSF 0 02/18/2018   WBCCSF 5 02/18/2018   WBCCSF 5 02/18/2018   POLYSCSF 0 02/18/2018   POLYSCSF 0 02/18/2018   LYMPHSCSF 83 02/18/2018   LYMPHSCSF 70 02/18/2018   EOSCSF 0 02/18/2018   EOSCSF 0 02/18/2018   PROTEINCSF 40 02/18/2018   GLUCCSF 67 02/18/2018    JCVIRUS Negative 02/18/2018   CSFOLI Comment 02/18/2018   IGGCSF 2.6 02/18/2018                        Bone Lab Results  Component Value Date   25OHVITD1 14 (L) 02/07/2018   25OHVITD2 <1.0 02/07/2018   25OHVITD3 14 02/07/2018  Coagulation Lab Results  Component Value Date   INR 0.85 12/18/2018   LABPROT 11.5 12/18/2018   APTT 38 (H) 12/18/2018   PLT 300 07/14/2019                        Cardiovascular Lab Results  Component Value Date   BNP 16.0 12/29/2018   TROPONINI <0.03 12/18/2018   HGB 10.0 (L) 07/14/2019   HCT 31.4 (L) 07/14/2019                         ID Lab Results  Component Value Date   HIV Non Reactive 12/18/2018   [place] NEGATIVE 08/15/2019    Cancer No results found for: CEA, CA125, LABCA2                      Endocrine Lab Results  Component Value Date   TSH 0.958 04/18/2018                        Note: Lab results reviewed.  Recent Diagnostic Imaging Results  CT Renal Stone Study CLINICAL DATA:  Hematuria.  Lower abdominal pain  EXAM: CT ABDOMEN AND PELVIS WITHOUT CONTRAST  TECHNIQUE: Multidetector CT imaging of the abdomen and pelvis was performed following the standard protocol without IV contrast.  COMPARISON:  CT abdomen pelvis 08/18/2014  FINDINGS: Lower chest: Mild scarring in the right lung base. No acute infiltrate or effusion  Hepatobiliary: Postop cholecystectomy. Common bile duct mildly prominent 10 mm. No liver lesion.  Pancreas: Negative  Spleen: Negative  Adrenals/Urinary Tract: Mild fullness right renal pelvis. Right ureter nondilated. 3 mm calculus anterior to the iliac vessels on the right appears to be within the ureter but could be a phlebolith. This was not present previously.  Left kidney normal. No other right renal calculi. Normal urinary bladder.  Stomach/Bowel: Negative for bowel obstruction. No bowel obstruction or edema.  Vascular/Lymphatic: Minimal  atherosclerotic disease in the aorta. No lymphadenopathy  Reproductive: Hysterectomy.  No pelvic mass.  Other: No free fluid  Musculoskeletal: Mild degenerative changes lumbar spine without acute abnormality.  IMPRESSION: 3 mm calcification anterior to the iliac vessels in the right, probable ureteral calculus versus phlebolith. Mild fullness of the right renal pelvis. No other renal calculi.  Electronically Signed   By: Franchot Gallo M.D.   On: 07/14/2019 16:04 DG Chest 2 View CLINICAL DATA:  Pt reports coughing blood and passing blood in urine since last night. Hx of asthma, COPD, HTN. Sarcoidosis. Nonsmoker.  EXAM: CHEST - 2 VIEW  COMPARISON:  12/29/2018  FINDINGS: Heart size is normal. There is architectural distortion and prominence of interstitial markings particularly within the perihilar regions and UPPER lobes. Scarring identified at both lung bases. There are no focal consolidations or pleural effusions. No pulmonary edema.  IMPRESSION: Chronic changes. No evidence for acute abnormality.  Electronically Signed   By: Nolon Nations M.D.   On: 07/14/2019 13:42  Complexity Note: Imaging results reviewed. Results shared with Ms. Owens Shark, using Layman's terms.                               Meds   Current Outpatient Medications:  .  albuterol (PROVENTIL) (2.5 MG/3ML) 0.083% nebulizer solution, Take 2.5 mg by nebulization every 6 (six) hours as needed for wheezing or shortness of breath., Disp: , Rfl:  .  Albuterol Sulfate 108 (90 Base) MCG/ACT AEPB, Inhale 2 puffs into the lungs every 6 (six) hours as needed., Disp: , Rfl:  .  ALPRAZolam (XANAX) 0.25 MG tablet, , Disp: , Rfl:  .  amLODipine (NORVASC) 10 MG tablet, Take 1 tablet (10 mg total) by mouth daily., Disp: 30 tablet, Rfl: 0 .  aspirin EC 81 MG tablet, Take 1 tablet (81 mg total) by mouth daily., Disp: 30 tablet, Rfl: 0 .  Aspirin-Calcium Carbonate 81-777 MG TABS, Take by mouth., Disp: , Rfl:  .   atorvastatin (LIPITOR) 20 MG tablet, Take 20 mg by mouth daily., Disp: , Rfl:  .  betamethasone, augmented, (DIPROLENE) 0.05 % lotion, Apply topically 2 (two) times daily., Disp: , Rfl:  .  budesonide-formoterol (SYMBICORT) 160-4.5 MCG/ACT inhaler, Inhale into the lungs., Disp: , Rfl:  .  busPIRone (BUSPAR) 15 MG tablet, , Disp: , Rfl:  .  calcium carbonate (OS-CAL) 1250 (500 Ca) MG chewable tablet, Chew by mouth daily., Disp: , Rfl:  .  citalopram (CELEXA) 20 MG tablet, Take 20 mg by mouth daily., Disp: , Rfl:  .  clotrimazole-betamethasone (LOTRISONE) cream, Apply 1 application topically 2 (two) times daily., Disp: 30 g, Rfl: 0 .  clotrimazole-betamethasone (LOTRISONE) cream, Apply 1 application topically 2 (two) times daily., Disp: , Rfl:  .  dexlansoprazole (DEXILANT) 60 MG capsule, Take 60 mg by mouth daily., Disp: , Rfl:  .  diphenhydrAMINE (BENADRYL) 25 MG tablet, Take 25 mg by mouth every 6 (six) hours as needed., Disp: , Rfl:  .  doxycycline (PERIOSTAT) 20 MG tablet, , Disp: , Rfl:  .  Elastic Bandages & Supports (MEDICAL COMPRESSION STOCKINGS) MISC, Please provide 7mHg compression stockings, Disp: 1 each, Rfl: 0 .  hydrochlorothiazide (HYDRODIURIL) 25 MG tablet, Take 1 tablet (25 mg total) by mouth daily., Disp: 30 tablet, Rfl: 0 .  hydrOXYzine (ATARAX/VISTARIL) 10 MG tablet, Take 10 mg by mouth., Disp: , Rfl:  .  ipratropium (ATROVENT) 0.02 % nebulizer solution, Take 0.5 mg by nebulization every 4 (four) hours as needed for wheezing or shortness of breath., Disp: , Rfl:  .  lactulose (CHRONULAC) 10 GM/15ML solution, Take 10 g by mouth 3 (three) times daily., Disp: , Rfl:  .  levETIRAcetam (KEPPRA) 500 MG tablet, Take 500 mg by mouth 2 (two) times daily., Disp: , Rfl:  .  linaclotide (LINZESS) 145 MCG CAPS capsule, Take 145 mcg by mouth daily before breakfast., Disp: , Rfl:  .  losartan (COZAAR) 25 MG tablet, Take 1 tablet (25 mg total) by mouth daily., Disp: 30 tablet, Rfl: 0 .   mirtazapine (REMERON) 15 MG tablet, , Disp: , Rfl:  .  nitroGLYCERIN (NITROSTAT) 0.4 MG SL tablet, Place 0.4 mg under the tongue every 5 (five) minutes as needed for chest pain., Disp: , Rfl:  .  olmesartan (BENICAR) 20 MG tablet, , Disp: , Rfl:  .  ondansetron (ZOFRAN) 4 MG tablet, Take 4 mg by mouth every 8 (eight) hours as needed for nausea or vomiting., Disp: , Rfl:  .  pantoprazole (PROTONIX) 40 MG tablet, Take 1 tablet (40 mg total) by mouth daily., Disp: 30 tablet, Rfl: 0 .  potassium chloride SA (K-DUR,KLOR-CON) 20 MEQ tablet, Take 1 tablet (20 mEq total) by mouth 2 (two) times daily., Disp: 30 tablet, Rfl: 0 .  promethazine (PHENERGAN) 25 MG tablet, Take 25 mg by mouth every 6 (six) hours as needed for nausea or vomiting., Disp: , Rfl:  .  QUEtiapine (SEROQUEL) 400 MG  tablet, Take 400 mg by mouth at bedtime., Disp: , Rfl:  .  sucralfate (CARAFATE) 1 g tablet, Take 1 tablet (1 g total) by mouth 4 (four) times daily., Disp: 120 tablet, Rfl: 0 .  torsemide (DEMADEX) 20 MG tablet, Take 1 tablet (20 mg total) by mouth daily., Disp: 5 tablet, Rfl: 0 .  traZODone (DESYREL) 50 MG tablet, Take 50 mg by mouth at bedtime., Disp: , Rfl:  .  venlafaxine (EFFEXOR) 75 MG tablet, TK 1 T PO BID, Disp: , Rfl: 3 .  [START ON 09/16/2019] diclofenac sodium (VOLTAREN) 1 % GEL, Apply 4 g topically 4 (four) times daily., Disp: 350 g, Rfl: 4 .  [START ON 09/16/2019] lidocaine (XYLOCAINE) 5 % ointment, Apply 1 application topically 4 (four) times daily as needed for moderate pain. 1 application = 6 in. of ointment = 5 g (Max: 20 g/day), Disp: 35.44 g, Rfl: 2 .  Oxycodone HCl 10 MG TABS, Take 1 tablet (10 mg total) by mouth every 8 (eight) hours as needed. Must last 30 days., Disp: 90 tablet, Rfl: 0 .  oxyCODONE-acetaminophen (PERCOCET) 5-325 MG tablet, Take 1 tablet by mouth every 8 (eight) hours as needed. (Patient not taking: Reported on 09/03/2019), Disp: 20 tablet, Rfl: 0 .  QUEtiapine (SEROQUEL) 100 MG tablet, Take  100 mg by mouth at bedtime., Disp: , Rfl:  .  [START ON 09/16/2019] tiZANidine (ZANAFLEX) 4 MG tablet, Take 1 tablet (4 mg total) by mouth 3 (three) times daily., Disp: 90 tablet, Rfl: 2  ROS  Constitutional: Denies any fever or chills Gastrointestinal: No reported hemesis, hematochezia, vomiting, or acute GI distress Musculoskeletal: Denies any acute onset joint swelling, redness, loss of ROM, or weakness Neurological: No reported episodes of acute onset apraxia, aphasia, dysarthria, agnosia, amnesia, paralysis, loss of coordination, or loss of consciousness  Allergies  Ms. Gholson is allergic to contrast media [iodinated diagnostic agents]; gabapentin; labetalol; sulfabenzamide; fentanyl; and penicillins.  [place]  Drug: Ms. Vincelette  reports no history of drug use. Alcohol:  reports current alcohol use. Tobacco:  reports that she has never smoked. She has never used smokeless tobacco. Medical:  has a past medical history of Acute postoperative pain (01/16/2019), Allergy, Asthma, Cerebral aneurysm, Chest pain (11/01/2016), Chronic migraine, Chronic respiratory failure ([place]), COPD (chronic obstructive pulmonary disease) ([place]), Depression, Hypertension, Osteoarthritis, Pneumonia, Sarcoid, and Seizures ([place]). Surgical: Ms. Probert  has a past surgical history that includes Cholecystectomy; Abdominal hysterectomy; Spinal fusion; Back surgery; Esophagogastroduodenoscopy (egd) with propofol (N/A, 08/19/2019); and Colonoscopy with propofol (N/A, 08/19/2019). Family: family history includes Breast cancer in her mother; Dementia in her mother; Throat cancer in her father.  Constitutional Exam  General appearance: Well nourished, well developed, and well hydrated. In no apparent acute distress Vitals:   09/03/19 0927  BP: 98/73  Pulse: (!) 113  Resp: 18  Temp: 98.8 F (37.1 C)  TempSrc: Oral  SpO2: 99%  Weight: 155 lb (70.3 kg)  Height: _0  (1.6 m)   BMI Assessment: Estimated body mass index is 27.46  kg/m as calculated from the following:   Height as of this encounter: _1  (1.6 m).   Weight as of this encounter: 155 lb (70.3 kg).  BMI interpretation table: BMI level Category Range association with higher incidence of chronic pain  <18 kg/m2 Underweight   18.5-24.9 kg/m2 Ideal body weight   25-29.9 kg/m2 Overweight Increased incidence by 20%  30-34.9 kg/m2 Obese (Class I) Increased incidence by 68%  35-39.9 kg/m2 Severe obesity (Class II)  Increased incidence by 136%  >40 kg/m2 Extreme obesity (Class III) Increased incidence by 254%   Patient's current BMI Ideal Body weight  Body mass index is 27.46 kg/m. Ideal body weight: 52.4 kg (115 lb 8.3 oz) Adjusted ideal body weight: 59.6 kg (131 lb 5 oz)   BMI Readings from Last 4 Encounters:  09/03/19 27.46 kg/m  08/19/19 25.99 kg/m  07/14/19 28.34 kg/m  06/24/19 28.34 kg/m   Wt Readings from Last 4 Encounters:  09/03/19 155 lb (70.3 kg)  08/19/19 161 lb (73 kg)  07/14/19 160 lb (72.6 kg)  06/24/19 160 lb (72.6 kg)  Psych/Mental status: Alert, oriented x 3 (person, place, & time)       Eyes: PERLA Respiratory: No evidence of acute respiratory distress  Cervical Spine Area Exam  Skin & Axial Inspection: No masses, redness, edema, swelling, or associated skin lesions Alignment: Symmetrical Functional ROM: Unrestricted ROM      Stability: No instability detected Muscle Tone/Strength: Functionally intact. No obvious neuro-muscular anomalies detected. Sensory (Neurological): Unimpaired Palpation: No palpable anomalies              Upper Extremity (UE) Exam    Side: Right upper extremity  Side: Left upper extremity  Skin & Extremity Inspection: Skin color, temperature, and hair growth are WNL. No peripheral edema or cyanosis. No masses, redness, swelling, asymmetry, or associated skin lesions. No contractures.  Skin & Extremity Inspection: Skin color, temperature, and hair growth are WNL. No peripheral edema or cyanosis. No  masses, redness, swelling, asymmetry, or associated skin lesions. No contractures.  Functional ROM: Unrestricted ROM          Functional ROM: Unrestricted ROM          Muscle Tone/Strength: Functionally intact. No obvious neuro-muscular anomalies detected.  Muscle Tone/Strength: Functionally intact. No obvious neuro-muscular anomalies detected.  Sensory (Neurological): Unimpaired          Sensory (Neurological): Unimpaired          Palpation: No palpable anomalies              Palpation: No palpable anomalies              Provocative Test(s):  Phalen's test: deferred Tinel's test: deferred Apley's scratch test (touch opposite shoulder):  Action 1 (Across chest): deferred Action 2 (Overhead): deferred Action 3 (LB reach): deferred   Provocative Test(s):  Phalen's test: deferred Tinel's test: deferred Apley's scratch test (touch opposite shoulder):  Action 1 (Across chest): deferred Action 2 (Overhead): deferred Action 3 (LB reach): deferred    Thoracic Spine Area Exam  Skin & Axial Inspection: No masses, redness, or swelling Alignment: Symmetrical Functional ROM: Unrestricted ROM Stability: No instability detected Muscle Tone/Strength: Functionally intact. No obvious neuro-muscular anomalies detected. Sensory (Neurological): Unimpaired Muscle strength & Tone: No palpable anomalies  Lumbar Spine Area Exam  Skin & Axial Inspection: No masses, redness, or swelling Alignment: Symmetrical Functional ROM: Unrestricted ROM       Stability: No instability detected Muscle Tone/Strength: Functionally intact. No obvious neuro-muscular anomalies detected. Sensory (Neurological): Unimpaired Palpation: No palpable anomalies       Provocative Tests: Hyperextension/rotation test: deferred today       Lumbar quadrant test (Kemp's test): deferred today       Lateral bending test: deferred today       Patrick's Maneuver: deferred today                   FABER* test: deferred today  S-I anterior distraction/compression test: deferred today         S-I lateral compression test: deferred today         S-I Thigh-thrust test: deferred today         S-I Gaenslen's test: deferred today         *(Flexion, ABduction and External Rotation)  Gait & Posture Assessment  Ambulation: Unassisted Gait: Relatively normal for age and body habitus Posture: WNL   Lower Extremity Exam    Side: Right lower extremity  Side: Left lower extremity  Stability: No instability observed          Stability: No instability observed          Skin & Extremity Inspection: Skin color, temperature, and hair growth are WNL. No peripheral edema or cyanosis. No masses, redness, swelling, asymmetry, or associated skin lesions. No contractures.  Skin & Extremity Inspection: Skin color, temperature, and hair growth are WNL. No peripheral edema or cyanosis. No masses, redness, swelling, asymmetry, or associated skin lesions. No contractures.  Functional ROM: Unrestricted ROM                  Functional ROM: Unrestricted ROM                  Muscle Tone/Strength: Functionally intact. No obvious neuro-muscular anomalies detected.  Muscle Tone/Strength: Functionally intact. No obvious neuro-muscular anomalies detected.  Sensory (Neurological): Unimpaired        Sensory (Neurological): Unimpaired        DTR: Patellar: deferred today Achilles: deferred today Plantar: deferred today  DTR: Patellar: deferred today Achilles: deferred today Plantar: deferred today  Palpation: No palpable anomalies  Palpation: No palpable anomalies   Assessment   Status Diagnosis  Controlled Controlled Controlled 1. Chronic pain syndrome   2. Chronic headache disorder (Primary Area of Pain)   3. 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 Chronic lower extremity pain (Tertiary Area of Pain) (Bilateral) (R>L)   5. Chronic hip pain (Tertiary Area of Pain) (Bilateral) (R>L)    6. Chronic knee pain (Fourth Area of Pain) (Bilateral) (R>L)   7. Chronic shoulder pain (Fifth Area of Pain) (Bilateral) (R>L)   8. Chronic musculoskeletal pain   9. Disturbance of skin sensation   10. Osteoarthritis of knee (Bilateral) (R>L)   11. Chronic lumbar radiculopathy (Right)      Updated Problems: No problems updated.  Plan of Care  Pharmacotherapy (Medications Ordered): Meds ordered this encounter  Medications  . tiZANidine (ZANAFLEX) 4 MG tablet    Sig: Take 1 tablet (4 mg total) by mouth 3 (three) times daily.    Dispense:  90 tablet    Refill:  2    Fill one day early if pharmacy is closed on scheduled refill date. May substitute for generic if available.  . lidocaine (XYLOCAINE) 5 % ointment    Sig: Apply 1 application topically 4 (four) times daily as needed for moderate pain. 1 application = 6 in. of ointment = 5 g (Max: 20 g/day)    Dispense:  35.44 g    Refill:  2    Please instruct patient on correct application. Fill one day early if pharmacy is closed on scheduled refill date. May substitute for generic if available.  . diclofenac sodium (VOLTAREN) 1 % GEL    Sig: Apply 4 g topically 4 (four) times daily.    Dispense:  350 g    Refill:  4    Fill one day early if pharmacy is closed on scheduled refill date. May substitute for generic if available.  . Oxycodone HCl 10 MG TABS    Sig: Take 1 tablet (10 mg total) by mouth every 8 (eight) hours as needed. Must last 30 days.    Dispense:  90 tablet    Refill:  0    Chronic Pain. (STOP Act - Not applicable). Fill one day early if closed on scheduled refill date. Do not fill until: 09/03/2019. To last until: 10/03/2019.   Medications administered today: Maryruth F. Harvie had no medications administered during this visit.  Orders:  Orders Placed This Encounter  Procedures  . Lumbar Transforaminal Epidural    Standing Status:   Future    Standing Expiration Date:   10/03/2019    Scheduling Instructions:      Side: Right-sided     Level: L5     Sedation: With Sedation.     Timeframe: ASAP    Order Specific Question:   Where will this procedure be performed?    Answer:   [facility]  . Lumbar Epidural Injection    Standing Status:   Future    Standing Expiration Date:   10/03/2019    Scheduling Instructions:     Procedure: Interlaminar Lumbar Epidural Steroid injection (LESI)  L4-5     Laterality: Left-sided     Sedation: With Sedation.     Timeframe: ASAP    Order Specific Question:   Where will this procedure be performed?    Answer:   [facility]  . Ambulatory referral to Psychology    Referral Priority:   Routine    Referral Type:   Psychiatric    Referral Reason:   Specialty Services Required    Referred to Provider:   Lajoyce Lauber, MD    Requested Specialty:   Psychology    Number of Visits Requested:   1   Lab Orders  No laboratory test(s) ordered today   Imaging Orders  No imaging studies ordered today    Referral Orders     Ambulatory referral to Psychology Planned follow-up:   Return for Procedure (w/ sedation): (R) L5 TFESI + (L) L4-5 LESI.  The patient has already seen Dr. Lacinda Axon (neurosurgeon), who has recommended against surgical decompression and fusion.  He has recommended a trial of a spinal cord stimulator.  Today we will begin the process of sending the patient to the medical psychologist for evaluation and we will bring the patient back for a palliative right-sided L5 transforaminal epidural steroid injection plus a left-sided L4-5 interlaminar lumbar epidural steroid injection under fluoroscopic guidance to see if we can help her with her radicular component until we can get the rest of the process lined up for a spinal cord stimulator trial.     Considering: NOTE: [facility]. Diagnostic right-sided L5 TFESI #1  Diagnostic left-sided L4-5 interlaminar LESI #1  Possible bilateral occipital nerve RFA Possible bilateral  occipital nerve peripheral nerves stimulator trial Diagnostic bilateral cervical facet block Possible bilateral cervical facet RFA Diagnostic right-sided cervical epidural steroid injection Diagnostic bilateral intra-articular shoulder joint injection Diagnostic bilateral suprascapular nerve block Possible bilateral suprascapular nerve RFA Diagnostic right-sided lumbar epidural steroid injection Diagnostic bilateral transforaminal epidural steroid injection Diagnostic bilateral intra-articular hip joint injection Diagnostic bilateral femoral nerve + obturator nerve block Possible bilateral femoral nerve + obturator nerve RFA Diagnostic bilateral genicular nerve block Possible bilateral genicular nerve RFA  Palliative PRN treatment(s): Palliative bilateral lumbar facet blocks   Palliative bilateral lumbar facet RFA #2   Diagnostic bilateral GONB#2 Palliative bilateral intra-articular knee joint injection (w/ steroid)  Palliative bilateral intra-articular Hyalgan knee injections(series#2) (series#1completed on 07/04/2018)     Recent Visits Date Type Provider Dept  07/10/19 Office Visit Milinda Pointer, MD [facility]  06/24/19 Procedure visit Milinda Pointer, MD [facility]  06/16/19 Office Visit Milinda Pointer, MD [facility]  Showing recent visits within past 90 days and meeting all other requirements   Today's Visits Date Type Provider Dept  09/03/19 Office Visit Milinda Pointer, MD [facility]  Showing today's visits and meeting all other requirements   Future Appointments Date Type Provider Dept  09/23/19 Appointment Milinda Pointer, MD [facility]  Showing future appointments within next 90 days and meeting all other requirements   Primary Care Physician: Jodi Marble, MD Location: [facility] Note by: Gaspar Cola, MD Date: 09/03/2019; Time:  12:26 PM  Note: This dictation was prepared with Dragon dictation. Any transcriptional errors that may result from this process are unintentional.

## 2019-09-03 ENCOUNTER — Ambulatory Visit: Payer: Medicare Other | Attending: Pain Medicine | Admitting: Pain Medicine

## 2019-09-03 ENCOUNTER — Encounter: Payer: Self-pay | Admitting: Pain Medicine

## 2019-09-03 ENCOUNTER — Other Ambulatory Visit: Payer: Self-pay

## 2019-09-03 VITALS — BP 98/73 | HR 113 | Temp 98.8°F | Resp 18 | Ht 63.0 in | Wt 155.0 lb

## 2019-09-03 DIAGNOSIS — M7918 Myalgia, other site: Secondary | ICD-10-CM | POA: Insufficient documentation

## 2019-09-03 DIAGNOSIS — M25561 Pain in right knee: Secondary | ICD-10-CM | POA: Diagnosis present

## 2019-09-03 DIAGNOSIS — M170 Bilateral primary osteoarthritis of knee: Secondary | ICD-10-CM | POA: Diagnosis present

## 2019-09-03 DIAGNOSIS — M79604 Pain in right leg: Secondary | ICD-10-CM | POA: Insufficient documentation

## 2019-09-03 DIAGNOSIS — M25512 Pain in left shoulder: Secondary | ICD-10-CM

## 2019-09-03 DIAGNOSIS — M79605 Pain in left leg: Secondary | ICD-10-CM | POA: Diagnosis present

## 2019-09-03 DIAGNOSIS — M5441 Lumbago with sciatica, right side: Secondary | ICD-10-CM | POA: Insufficient documentation

## 2019-09-03 DIAGNOSIS — G894 Chronic pain syndrome: Secondary | ICD-10-CM | POA: Diagnosis present

## 2019-09-03 DIAGNOSIS — M5416 Radiculopathy, lumbar region: Secondary | ICD-10-CM | POA: Diagnosis present

## 2019-09-03 DIAGNOSIS — R51 Headache: Secondary | ICD-10-CM

## 2019-09-03 DIAGNOSIS — G8929 Other chronic pain: Secondary | ICD-10-CM | POA: Diagnosis present

## 2019-09-03 DIAGNOSIS — M25552 Pain in left hip: Secondary | ICD-10-CM | POA: Diagnosis present

## 2019-09-03 DIAGNOSIS — R209 Unspecified disturbances of skin sensation: Secondary | ICD-10-CM

## 2019-09-03 DIAGNOSIS — M25551 Pain in right hip: Secondary | ICD-10-CM | POA: Insufficient documentation

## 2019-09-03 DIAGNOSIS — M25511 Pain in right shoulder: Secondary | ICD-10-CM | POA: Insufficient documentation

## 2019-09-03 DIAGNOSIS — M25562 Pain in left knee: Secondary | ICD-10-CM | POA: Insufficient documentation

## 2019-09-03 DIAGNOSIS — R519 Headache, unspecified: Secondary | ICD-10-CM

## 2019-09-03 MED ORDER — DICLOFENAC SODIUM 1 % TD GEL: 4.0000 g | Freq: Four times a day (QID) | TRANSDERMAL | 4 refills | Status: DC

## 2019-09-03 MED ORDER — LIDOCAINE 5 % EX OINT: 1.0000 "application " | TOPICAL_OINTMENT | Freq: Four times a day (QID) | CUTANEOUS | 2 refills | Status: DC | PRN

## 2019-09-03 MED ORDER — OXYCODONE HCL 10 MG PO TABS: 10.0000 mg | ORAL_TABLET | Freq: Three times a day (TID) | ORAL | 0 refills | Status: DC | PRN

## 2019-09-03 MED ORDER — TIZANIDINE HCL 4 MG PO TABS: 4.0000 mg | ORAL_TABLET | Freq: Three times a day (TID) | ORAL | 2 refills | Status: DC

## 2019-09-03 NOTE — Patient Instructions (Addendum)
____________________________________________________________________________________________  Preparing for Procedure with Sedation  Procedure appointments are limited to planned procedures: . No Prescription Refills. . No disability issues will be discussed. . No medication changes will be discussed.  Instructions: . Oral Intake: Do not eat or drink anything for at least 8 hours prior to your procedure. . Transportation: Public transportation is not allowed. Bring an adult driver. The driver must be physically present in our waiting room before any procedure can be started. . Physical Assistance: Bring an adult physically capable of assisting you, in the event you need help. This adult should keep you company at home for at least 6 hours after the procedure. . Blood Pressure Medicine: Take your blood pressure medicine with a sip of water the morning of the procedure. . Blood thinners: Notify our staff if you are taking any blood thinners. Depending on which one you take, there will be specific instructions on how and when to stop it. . Diabetics on insulin: Notify the staff so that you can be scheduled 1st case in the morning. If your diabetes requires high dose insulin, take only  of your normal insulin dose the morning of the procedure and notify the staff that you have done so. . Preventing infections: Shower with an antibacterial soap the morning of your procedure. . Build-up your immune system: Take 1000 mg of Vitamin C with every meal (3 times a day) the day prior to your procedure. . Antibiotics: Inform the staff if you have a condition or reason that requires you to take antibiotics before dental procedures. . Pregnancy: If you are pregnant, call and cancel the procedure. . Sickness: If you have a cold, fever, or any active infections, call and cancel the procedure. . Arrival: You must be in the facility at least 30 minutes prior to your scheduled procedure. . Children: Do not bring  children with you. . Dress appropriately: Bring dark clothing that you would not mind if they get stained. . Valuables: Do not bring any jewelry or valuables.  Reasons to call and reschedule or cancel your procedure: (Following these recommendations will minimize the risk of a serious complication.) . Surgeries: Avoid having procedures within 2 weeks of any surgery. (Avoid for 2 weeks before or after any surgery). . Flu Shots: Avoid having procedures within 2 weeks of a flu shots or . (Avoid for 2 weeks before or after immunizations). . Barium: Avoid having a procedure within 7-10 days after having had a radiological study involving the use of radiological contrast. (Myelograms, Barium swallow or enema study). . Heart attacks: Avoid any elective procedures or surgeries for the initial 6 months after a "Myocardial Infarction" (Heart Attack). . Blood thinners: It is imperative that you stop these medications before procedures. Let us know if you if you take any blood thinner.  . Infection: Avoid procedures during or within two weeks of an infection (including chest colds or gastrointestinal problems). Symptoms associated with infections include: Localized redness, fever, chills, night sweats or profuse sweating, burning sensation when voiding, cough, congestion, stuffiness, runny nose, sore throat, diarrhea, nausea, vomiting, cold or Flu symptoms, recent or current infections. It is specially important if the infection is over the area that we intend to treat. . Heart and lung problems: Symptoms that may suggest an active cardiopulmonary problem include: cough, chest pain, breathing difficulties or shortness of breath, dizziness, ankle swelling, uncontrolled high or unusually low blood pressure, and/or palpitations. If you are experiencing any of these symptoms, cancel your procedure and contact   your primary care physician for an evaluation.  Remember:  Regular Business hours are:  Monday to Thursday  8:00 AM to 4:00 PM  Provider's Schedule: Milinda Pointer, MD:  Procedure days: Tuesday and Thursday 7:30 AM to 4:00 PM  Gillis Santa, MD:  Procedure days: Monday and Wednesday 7:30 AM to 4:00 PM ____________________________________________________________________________________________   Preparing for Procedure with Sedation Instructions: . Oral Intake: Do not eat or drink anything for at least 8 hours prior to your procedure. . Transportation: Public transportation is not allowed. Bring an adult driver. The driver must be physically present in our waiting room before any procedure can be started. Marland Kitchen Physical Assistance: Bring an adult capable of physically assisting you, in the event you need help. . Blood Pressure Medicine: Take your blood pressure medicine with a sip of water the morning of the procedure. . Insulin: Take only  of your normal insulin dose. . Preventing infections: Shower with an antibacterial soap the morning of your procedure. . Build-up your immune system: Take 1000 mg of Vitamin C with every meal (3 times a day) the day prior to your procedure. . Pregnancy: If you are pregnant, call and cancel the procedure. . Sickness: If you have a cold, fever, or any active infections, call and cancel the procedure. . Arrival: You must be in the facility at least 30 minutes prior to your scheduled procedure. . Children: Do not bring children with you. . Dress appropriately: Bring dark clothing that you would not mind if they get stained. . Valuables: Do not bring any jewelry or valuables. Procedure appointments are reserved for interventional treatments only. Marland Kitchen No Prescription Refills. . No medication changes will be discussed during procedure appointments. . No disability issues will be discussed. Marland Kitchen

## 2019-09-03 NOTE — Progress Notes (Signed)
Nursing Pain Medication Assessment:  Safety precautions to be maintained throughout the outpatient stay will include: orient to surroundings, keep bed in low position, maintain call bell within reach at all times, provide assistance with transfer out of bed and ambulation.  Medication Inspection Compliance: Pill count conducted under aseptic conditions, in front of the patient. Neither the pills nor the bottle was removed from the patient's sight at any time. Once count was completed pills were immediately returned to the patient in their original bottle.  Medication: Hydrocodone/APAP Pill/Patch Count: 56  of 120 pills remain Pill/Patch Appearance: Markings consistent with prescribed medication Bottle Appearance: Standard pharmacy container. Clearly labeled. Filled Date: 09/02 / 2020 Last Medication intake:  Yesterday  #55 pills wasted , witnessed by D. Donneta Romberg [employer]

## 2019-09-05 ENCOUNTER — Ambulatory Visit: Admission: RE | Admit: 2019-09-05 | Payer: Medicare Other | Source: Home / Self Care | Admitting: Gastroenterology

## 2019-09-05 ENCOUNTER — Encounter: Admission: RE | Payer: Self-pay | Source: Home / Self Care

## 2019-09-05 SURGERY — ESOPHAGOGASTRODUODENOSCOPY (EGD) WITH PROPOFOL
Anesthesia: General

## 2019-09-15 ENCOUNTER — Other Ambulatory Visit: Payer: Medicare Other

## 2019-09-22 NOTE — Progress Notes (Signed)
Patient's Name: Jean Gomez  MRN: [id number]  Referring Provider: Jodi Marble, MD  DOB: 07/25/1956  PCP: Jodi Marble, MD  DOS: 09/23/2019  Note by: Gaspar Cola, MD  Service setting: Ambulatory outpatient  Specialty: Interventional Pain Management  Patient type: Established  Location: [facility] (AMB) [facility] Facility  Visit type: Interventional Procedure   Primary Reason for Visit: Interventional Pain Management Treatment. CC: Back Pain (right, lower)  Procedure #1:  Anesthesia, Analgesia, Anxiolysis:  Type: Therapeutic Trans-Foraminal Epidural Steroid Injection   #1  Region: Lumbar Level: L5 Paravertebral Laterality: Right Paravertebral   Type: Moderate (Conscious) Sedation combined with Local Anesthesia Indication(s): Analgesia and Anxiety Route: Intravenous (IV) IV Access: Secured Sedation: Meaningful verbal contact was maintained at all times during the procedure  Local Anesthetic: Lidocaine 1-2%  Position: Prone  Procedure #2:    Type: Therapeutic Inter-Laminar Epidural Steroid Injection   #1  Region: Lumbar Level: L4-5 Level. Laterality: Left Paramedial     Indications: 1. DDD (degenerative disc disease), lumbosacral   2. Chronic lower extremity pain (Tertiary Area of Pain) (Bilateral) (R>L)   3. Lumbar foraminal stenosis   4. 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 Chronic lumbar radiculopathy (Right)   6. History of allergy to radiographic contrast media    Pain Score: Pre-procedure: 8 /10 Post-procedure: 0-No pain/10   Pre-op Assessment:  Jean Gomez is a 63 y.o. (year old), female patient, seen today for interventional treatment. She  has a past surgical history that includes Cholecystectomy; Abdominal hysterectomy; Spinal fusion; Back surgery; Esophagogastroduodenoscopy (egd) with propofol (N/A, 08/19/2019); and Colonoscopy with propofol (N/A, 08/19/2019). Jean Gomez has a current medication list  which includes the following prescription(s): albuterol, albuterol sulfate, alprazolam, amlodipine, aspirin ec, aspirin-calcium carbonate, atorvastatin, betamethasone (augmented), budesonide-formoterol, buspirone, calcium carbonate, citalopram, clotrimazole-betamethasone, clotrimazole-betamethasone, dexilant, diclofenac sodium, diphenhydramine, doxycycline, medical compression stockings, hydrochlorothiazide, hydroxyzine, ipratropium, lactulose, levetiracetam, lidocaine, linaclotide, losartan, mirtazapine, nitroglycerin, olmesartan, ondansetron, oxycodone hcl, oxycodone-acetaminophen, pantoprazole, potassium chloride sa, promethazine, quetiapine, quetiapine, sucralfate, tizanidine, torsemide, trazodone, and venlafaxine, and the following Facility-Administered Medications: midazolam, sodium chloride flush, and sodium chloride flush. Her primarily concern today is the Back Pain (right, lower)  Initial Vital Signs:  Pulse/HCG Rate: 96ECG Heart Rate: 94 Temp: 99 F (37.2 C) Resp: 16 BP: 106/69 SpO2: 100 %  BMI: Estimated body mass index is 27.46 kg/m as calculated from the following:   Height as of this encounter: 5\' 3"  (1.6 m).   Weight as of this encounter: 155 lb (70.3 kg).  Risk Assessment: Allergies: Reviewed. She is allergic to contrast media [iodinated diagnostic agents]; gabapentin; labetalol; sulfabenzamide; fentanyl; and penicillins.  Allergy Precautions: None required Coagulopathies: Reviewed. None identified.  Blood-thinner therapy: None at this time Active Infection(s): Reviewed. None identified. Jean Gomez is afebrile  Site Confirmation: Jean Gomez was asked to confirm the procedure and laterality before marking the site Procedure checklist: Completed Consent: Before the procedure and under the influence of no sedative(s), amnesic(s), or anxiolytics, the patient was informed of the treatment options, risks and possible complications. To fulfill our ethical and legal obligations, as  recommended by the American Medical Association's Code of Ethics, I have informed the patient of my clinical impression; the nature and purpose of the treatment or procedure; the risks, benefits, and possible complications of the intervention; the alternatives, including doing nothing; the risk(s) and benefit(s) of the alternative treatment(s) or procedure(s); and the risk(s) and benefit(s) of doing nothing. The patient was provided  information about the general risks and possible complications associated with the procedure. These may include, but are not limited to: failure to achieve desired goals, infection, bleeding, organ or nerve damage, allergic reactions, paralysis, and death. In addition, the patient was informed of those risks and complications associated to Spine-related procedures, such as failure to decrease pain; infection (i.e.: Meningitis, epidural or intraspinal abscess); bleeding (i.e.: epidural hematoma, subarachnoid hemorrhage, or any other type of intraspinal or peri-dural bleeding); organ or nerve damage (i.e.: Any type of peripheral nerve, nerve root, or spinal cord injury) with subsequent damage to sensory, motor, and/or autonomic systems, resulting in permanent pain, numbness, and/or weakness of one or several areas of the body; allergic reactions; (i.e.: anaphylactic reaction); and/or death. Furthermore, the patient was informed of those risks and complications associated with the medications. These include, but are not limited to: allergic reactions (i.e.: anaphylactic or anaphylactoid reaction(s)); adrenal axis suppression; blood sugar elevation that in diabetics may result in ketoacidosis or comma; water retention that in patients with history of congestive heart failure may result in shortness of breath, pulmonary edema, and decompensation with resultant heart failure; weight gain; swelling or edema; medication-induced neural toxicity; particulate matter embolism and blood vessel  occlusion with resultant organ, and/or nervous system infarction; and/or aseptic necrosis of one or more joints. Finally, the patient was informed that Medicine is not an exact science; therefore, there is also the possibility of unforeseen or unpredictable risks and/or possible complications that may result in a catastrophic outcome. The patient indicated having understood very clearly. We have given the patient no guarantees and we have made no promises. Enough time was given to the patient to ask questions, all of which were answered to the patient's satisfaction. Ms. Gasper has indicated that she wanted to continue with the procedure. Attestation: I, the ordering provider, attest that I have discussed with the patient the benefits, risks, side-effects, alternatives, likelihood of achieving goals, and potential problems during recovery for the procedure that I have provided informed consent. Date  Time: 09/23/2019 10:03 AM  Pre-Procedure Preparation:  Monitoring: As per clinic protocol. Respiration, ETCO2, SpO2, BP, heart rate and rhythm monitor placed and checked for adequate function Safety Precautions: Patient was assessed for positional comfort and pressure points before starting the procedure. Time-out: I initiated and conducted the "Time-out" before starting the procedure, as per protocol. The patient was asked to participate by confirming the accuracy of the "Time Out" information. Verification of the correct person, site, and procedure were performed and confirmed by me, the nursing staff, and the patient. "Time-out" conducted as per Joint Commission's Universal Protocol (UP.01.01.01). Time: 1146  Description of Procedure #1:  Target Area: The inferior and lateral portion of the pedicle, just lateral to a line created by the 6:00 position of the pedicle and the superior articular process of the vertebral body below. On the lateral view, this target lies just posterior to the anterior aspect of  the lamina and posterior to the midpoint created between the anterior and the posterior aspect of the neural foramina. Approach: Posterior paravertebral approach. Area Prepped: Entire Posterior Lumbosacral Region Prepping solution: DuraPrep (Iodine Povacrylex [0.7% available iodine] and Isopropyl Alcohol, 74% w/w) Safety Precautions: Aspiration looking for blood return was conducted prior to all injections. At no point did we inject any substances, as a needle was being advanced. No attempts were made at seeking any paresthesias. Safe injection practices and needle disposal techniques used. Medications properly checked for expiration dates. SDV (single dose vial) medications  used.  Description of the Procedure: Protocol guidelines were followed. The patient was placed in position over the fluoroscopy table. The target area was identified and the area prepped in the usual manner. Skin & deeper tissues infiltrated with local anesthetic. Appropriate amount of time allowed to pass for local anesthetics to take effect. The procedure needles were then advanced to the target area. Proper needle placement secured. Negative aspiration confirmed. Solution injected in intermittent fashion, asking for systemic symptoms every 0.2cc of injectate. The needles were then removed and the area cleansed, making sure to leave some of the prepping solution back to take advantage of its long term bactericidal properties.  Start Time: 1146 hrs.  Materials:  Needle(s) Type: Spinal Needle Gauge: 22G Length: 3.5-in Medication(s): Please see orders for medications and dosing details.  Description of Procedure #2:  Target Area: The  interlaminar space, initially targeting the lower border of the superior vertebral body lamina. Approach: Posterior paramedial approach. Area Prepped: Same as above Prepping solution: Same as above Safety Precautions: Same as above  Description of the Procedure: Protocol guidelines were  followed. The patient was placed in position over the fluoroscopy table. The target area was identified and the area prepped in the usual manner. Skin & deeper tissues infiltrated with local anesthetic. Appropriate amount of time allowed to pass for local anesthetics to take effect. The procedure needle was introduced through the skin, ipsilateral to the reported pain, and advanced to the target area. Bone was contacted and the needle walked caudad, until the lamina was cleared. The ligamentum flavum was engaged and loss-of-resistance technique used as the epidural needle was advanced. The epidural space was identified using "loss-of-resistance technique" with 2-3 ml of PF-NaCl (0.9% NSS), in a 5cc LOR glass syringe. Proper needle placement secured. Negative aspiration confirmed. Solution injected in intermittent fashion, asking for systemic symptoms every 0.5cc of injectate. The needles were then removed and the area cleansed, making sure to leave some of the prepping solution back to take advantage of its long term bactericidal properties.  Vitals:   09/23/19 1157 09/23/19 1210 09/23/19 1217 09/23/19 1226  BP: 129/75 (!) 146/92 (!) 139/97   Pulse: 88     Resp: 15 (!) 22 16 (!) 26  Temp:  98.2 F (36.8 C)  97.9 F (36.6 C)  TempSrc:  Temporal  Temporal  SpO2: 96% 100% 99% 99%  Weight:      Height:        End Time: 1157 hrs.  Materials:  Needle(s) Type: Epidural needle Gauge: 17G Length: 3.5-in Medication(s): Please see orders for medications and dosing details.  Imaging Guidance (Spinal):          Type of Imaging Technique: Fluoroscopy Guidance (Spinal) Indication(s): Assistance in needle guidance and placement for procedures requiring needle placement in or near specific anatomical locations not easily accessible without such assistance. Exposure Time: Please see nurses notes. Contrast: Before injecting any contrast, we confirmed that the patient did not have an allergy to iodine,  shellfish, or radiological contrast. Once satisfactory needle placement was completed at the desired level, radiological contrast was injected. Contrast injected under live fluoroscopy. No contrast complications. See chart for type and volume of contrast used. Fluoroscopic Guidance: I was personally present during the use of fluoroscopy. "Tunnel Vision Technique" used to obtain the best possible view of the target area. Parallax error corrected before commencing the procedure. "Direction-depth-direction" technique used to introduce the needle under continuous pulsed fluoroscopy. Once target was reached, antero-posterior, oblique, and lateral  fluoroscopic projection used confirm needle placement in all planes. Images permanently stored in EMR. Interpretation: I personally interpreted the imaging intraoperatively. Adequate needle placement confirmed in multiple planes. Appropriate spread of contrast into desired area was observed. No evidence of afferent or efferent intravascular uptake. No intrathecal or subarachnoid spread observed. Permanent images saved into the patient's record.  Antibiotic Prophylaxis:   Anti-infectives (From admission, onward)   None     Indication(s): None identified  Post-operative Assessment:  Post-procedure Vital Signs:  Pulse/HCG Rate: 8890 Temp: 97.9 F (36.6 C) Resp: (!) 26 BP: (!) 139/97 SpO2: 99 %  EBL: None  Complications: No immediate post-treatment complications observed by team, or reported by patient.  Note: The patient tolerated the entire procedure well. A repeat set of vitals were taken after the procedure and the patient was kept under observation following institutional policy, for this type of procedure. Post-procedural neurological assessment was performed, showing return to baseline, prior to discharge. The patient was provided with post-procedure discharge instructions, including a section on how to identify potential problems. Should any problems  arise concerning this procedure, the patient was given instructions to immediately contact us, at any time, without hesitation. In any case, we plan to contact the patient by telephone for a follow-up status report regarding this interventional procedure.  Comments:  No additional relevant information.  Plan of Care  Orders:  Orders Placed This Encounter  Procedures  . Lumbar Epidural Injection    Scheduling Instructions:     Procedure: Interlaminar LESI L4-5     Laterality: Left-sided     Sedation: Patient's choice     Timeframe:  Today    Order Specific Question:   Where will this procedure be performed?    Answer:   [facility]  . Lumbar Transforaminal Epidural    Scheduling Instructions:     Side: Right-sided     Level: L5     Sedation: With Sedation.     Timeframe: Today    Order Specific Question:   Where will this procedure be performed?    Answer:   [facility]  . DG PAIN [facility] C-ARM 1-60 MIN NO REPORT    Intraoperative interpretation by procedural physician at [facility].    Standing Status:   Standing    Number of Occurrences:   1    Order Specific Question:   Reason for exam:    Answer:   Assistance in needle guidance and placement for procedures requiring needle placement in or near specific anatomical locations not easily accessible without such assistance.  . Informed Consent Details: Physician/Practitioner Attestation; Transcribe to consent form and obtain patient signature    Provider Attestation: I, Briceson Broadwater A. Laban EmperorNaveira, MD, (Pain Management Specialist), the physician/practitioner, attest that I have discussed with the patient the benefits, risks, side effects, alternatives, likelihood of achieving goals and potential problems during recovery for the procedure that I have provided informed consent.    Scheduling Instructions:     Procedure: Lumbar epidural steroid injection under fluoroscopic guidance     Indications: Low back and/or  lower extremity pain secondary to lumbar radiculitis     Note: Always confirm laterality of pain with [name], before procedure.  . Informed Consent Details: Physician/Practitioner Attestation; Transcribe to consent form and obtain patient signature    Provider Attestation: I, Devanshi Califf A. Laban EmperorNaveira, MD, (Pain Management Specialist), the physician/practitioner, attest that I have discussed with the patient the benefits, risks, side effects, alternatives, likelihood of achieving goals and  potential problems during recovery for the procedure that I have provided informed consent.    Scheduling Instructions:     Procedure: Diagnostic lumbar transforaminal epidural steroid injection under fluoroscopic guidance. (See notes for level and laterality.)     Indication/Reason: Lumbar radiculopathy/radiculitis associated with lumbar stenosis     Note: Always confirm area and laterality of pain with Ms. Manson Passey, before procedure.  . Provide equipment / supplies at bedside    Equipment required: Single use, disposable, "Epidural Tray" Epidural Catheter: NOT required    Standing Status:   Standing    Number of Occurrences:   1    Order Specific Question:   Specify    Answer:   Epidural Tray  . Miscellanous precautions    Standing Status:   Standing    Number of Occurrences:   1   Chronic Opioid Analgesic:  Hydrocodone/acetaminophen 10/325 3 times daily (30 mg/day of Hydrocodone)(enough until 09/16/2019) today we will be switching the patient to oxycodone IR 10 mg 1 tablet p.o. every 8 hours (30 mg/day of oxycodone) MME/day:30mg /day.  This will be increased to 45 MME/day by the use of the oxycodone.   Medications ordered for procedure: Meds ordered this encounter  Medications  . sodium chloride flush (NS) 0.9 % injection 2 mL  . ropivacaine (PF) 2 mg/mL (0.2%) (NAROPIN) injection 2 mL  . triamcinolone acetonide (KENALOG-40) injection 40 mg  . sodium chloride flush (NS) 0.9 % injection 1 mL  .  ropivacaine (PF) 2 mg/mL (0.2%) (NAROPIN) injection 1 mL  . dexamethasone (DECADRON) injection 10 mg  . lidocaine (XYLOCAINE) 2 % (with pres) injection 400 mg  . lactated ringers infusion 1,000 mL  . midazolam (VERSED) 5 MG/5ML injection 1-2 mg    Make sure Flumazenil is available in the pyxis when using this medication. If oversedation occurs, administer 0.2 mg IV over 15 sec. If after 45 sec no response, administer 0.2 mg again over 1 min; may repeat at 1 min intervals; not to exceed 4 doses (1 mg)   Medications administered: We administered ropivacaine (PF) 2 mg/mL (0.2%), triamcinolone acetonide, ropivacaine (PF) 2 mg/mL (0.2%), dexamethasone, lidocaine, lactated ringers, and midazolam.  See the medical record for exact dosing, route, and time of administration.  Follow-up plan:   Return in about 2 weeks (around 10/07/2019) for (VV), (PP).       Considering: NOTE: CONTRAST & FENTANYL Allergy. Diagnostic right-sided L5 TFESI #2  Diagnostic left-sided L4-5 interlaminar LESI #2  Possible bilateral occipital nerve RFA Possible bilateral occipital nerve peripheral nerves stimulator trial Diagnostic bilateral cervical facet block Possible bilateral cervical facet RFA Diagnostic right-sided cervical epidural steroid injection Diagnostic bilateral intra-articular shoulder joint injection Diagnostic bilateral suprascapular nerve block Possible bilateral suprascapular nerve RFA Diagnostic right-sided lumbar epidural steroid injection Diagnostic bilateral transforaminal epidural steroid injection Diagnostic bilateral intra-articular hip joint injection Diagnostic bilateral femoral nerve + obturator nerve block Possible bilateral femoral nerve + obturator nerve RFA Diagnostic bilateral genicular nerve block Possible bilateral genicular nerve RFA   Palliative PRN treatment(s): Palliative bilateral lumbar facet blocks   Palliative bilateral lumbar facet RFA #2    Diagnostic bilateral GONB#2 Palliative bilateral intra-articular knee joint injection (w/ steroid)  Palliative bilateral intra-articular Hyalgan knee injections(series#2) (series#1completed on 07/04/2018)      Recent Visits Date Type Provider Dept  09/03/19 Office Visit Delano Metz, MD [facility]  07/10/19 Office Visit Delano Metz, MD [facility]  Showing recent visits within past 90 days and meeting all other requirements  Today's Visits Date Type Provider Dept  09/23/19 Procedure visit Delano Metz, MD [facility]  Showing today's visits and meeting all other requirements   Future Appointments Date Type Provider Dept  10/06/19 Appointment Delano Metz, MD [facility]  Showing future appointments within next 90 days and meeting all other requirements   Disposition: Discharge home  Discharge Date & Time: 09/23/2019; 1227 hrs.   Primary Care Physician: Sherron Monday, MD Location: [facility] Note by: Oswaldo Done, MD Date: 09/23/2019; Time: 12:29 PM  Disclaimer:  Medicine is not an Visual merchandiser. The only guarantee in medicine is that nothing is guaranteed. It is important to note that the decision to proceed with this intervention was based on the information collected from the patient. The Data and conclusions were drawn from the patient's questionnaire, the interview, and the physical examination. Because the information was provided in large part by the patient, it cannot be guaranteed that it has not been purposely or unconsciously manipulated. Every effort has been made to obtain as much relevant data as possible for this evaluation. It is important to note that the conclusions that lead to this procedure are derived in large part from the available data. Always take into account that the treatment will also be dependent on availability of resources and existing  treatment guidelines, considered by other Pain Management Practitioners as being common knowledge and practice, at the time of the intervention. For Medico-Legal purposes, it is also important to point out that variation in procedural techniques and pharmacological choices are the acceptable norm. The indications, contraindications, technique, and results of the above procedure should only be interpreted and judged by a Board-Certified Interventional Pain Specialist with extensive familiarity and expertise in the same exact procedure and technique.

## 2019-09-23 ENCOUNTER — Ambulatory Visit (HOSPITAL_BASED_OUTPATIENT_CLINIC_OR_DEPARTMENT_OTHER): Payer: Medicare Other | Admitting: Pain Medicine

## 2019-09-23 ENCOUNTER — Encounter: Payer: Self-pay | Admitting: Pain Medicine

## 2019-09-23 ENCOUNTER — Ambulatory Visit
Admission: RE | Admit: 2019-09-23 | Discharge: 2019-09-23 | Disposition: A | Discharge status: Home / Self Care | Payer: Medicare Other | Source: Ambulatory Visit | Attending: Pain Medicine | Admitting: Pain Medicine

## 2019-09-23 ENCOUNTER — Other Ambulatory Visit: Payer: Self-pay

## 2019-09-23 VITALS — BP 139/90 | HR 88 | Temp 97.9°F | Resp 26 | Ht 63.0 in | Wt 155.0 lb

## 2019-09-23 DIAGNOSIS — M5416 Radiculopathy, lumbar region: Secondary | ICD-10-CM | POA: Insufficient documentation

## 2019-09-23 DIAGNOSIS — M5137 Other intervertebral disc degeneration, lumbosacral region: Secondary | ICD-10-CM

## 2019-09-23 DIAGNOSIS — M79605 Pain in left leg: Secondary | ICD-10-CM | POA: Insufficient documentation

## 2019-09-23 DIAGNOSIS — M5441 Lumbago with sciatica, right side: Secondary | ICD-10-CM

## 2019-09-23 DIAGNOSIS — M79604 Pain in right leg: Secondary | ICD-10-CM | POA: Insufficient documentation

## 2019-09-23 DIAGNOSIS — M48061 Spinal stenosis, lumbar region without neurogenic claudication: Secondary | ICD-10-CM

## 2019-09-23 DIAGNOSIS — G8929 Other chronic pain: Secondary | ICD-10-CM

## 2019-09-23 DIAGNOSIS — Z91041 Radiographic dye allergy status: Secondary | ICD-10-CM | POA: Diagnosis present

## 2019-09-23 DIAGNOSIS — M51379 Other intervertebral disc degeneration, lumbosacral region without mention of lumbar back pain or lower extremity pain: Secondary | ICD-10-CM

## 2019-09-23 MED ORDER — SODIUM CHLORIDE 0.9% FLUSH: 2.0000 mL | Freq: Once | INTRAVENOUS | Status: DC

## 2019-09-23 MED ORDER — DEXAMETHASONE SODIUM PHOSPHATE 10 MG/ML IJ SOLN
10.0000 mg | Freq: Once | INTRAMUSCULAR | Status: AC
  Administered 2019-09-23: 10 mg
  Filled 2019-09-23: qty 1

## 2019-09-23 MED ORDER — TRIAMCINOLONE ACETONIDE 40 MG/ML IJ SUSP
40.0000 mg | Freq: Once | INTRAMUSCULAR | Status: AC
  Administered 2019-09-23: 40 mg
  Filled 2019-09-23: qty 1

## 2019-09-23 MED ORDER — ROPIVACAINE HCL 2 MG/ML IJ SOLN
2.0000 mL | Freq: Once | INTRAMUSCULAR | Status: AC
  Administered 2019-09-23: 12:00:00 2 mL via EPIDURAL
  Filled 2019-09-23: qty 10

## 2019-09-23 MED ORDER — SODIUM CHLORIDE 0.9% FLUSH: 1.0000 mL | Freq: Once | INTRAVENOUS | Status: DC

## 2019-09-23 MED ORDER — LACTATED RINGERS IV SOLN
1000.0000 mL | Freq: Once | INTRAVENOUS | Status: AC
  Administered 2019-09-23: 1000 mL via INTRAVENOUS

## 2019-09-23 MED ORDER — LIDOCAINE HCL 2 % IJ SOLN
20.0000 mL | Freq: Once | INTRAMUSCULAR | Status: AC
  Administered 2019-09-23: 12:00:00 400 mg
  Filled 2019-09-23: qty 20

## 2019-09-23 MED ORDER — MIDAZOLAM HCL 5 MG/5ML IJ SOLN
1.0000 mg | INTRAMUSCULAR | Status: DC | PRN
  Administered 2019-09-23: 12:00:00 2 mg via INTRAVENOUS
  Filled 2019-09-23: qty 5

## 2019-09-23 MED ORDER — ROPIVACAINE HCL 2 MG/ML IJ SOLN
1.0000 mL | Freq: Once | INTRAMUSCULAR | Status: AC
  Administered 2019-09-23: 2 mL via EPIDURAL
  Filled 2019-09-23: qty 10

## 2019-09-23 NOTE — Patient Instructions (Signed)

## 2019-09-24 ENCOUNTER — Telehealth: Payer: Self-pay | Admitting: *Deleted

## 2019-09-24 NOTE — Telephone Encounter (Signed)
Attempted to call for post procedure follow-up. Call could not be completed.

## 2019-09-25 ENCOUNTER — Encounter: Payer: Self-pay | Admitting: Psychology

## 2019-09-25 ENCOUNTER — Other Ambulatory Visit: Payer: Self-pay

## 2019-09-25 ENCOUNTER — Encounter

## 2019-09-25 ENCOUNTER — Encounter: Payer: Medicare Other | Attending: Psychology | Admitting: Psychology

## 2019-09-25 DIAGNOSIS — G8929 Other chronic pain: Secondary | ICD-10-CM

## 2019-09-25 DIAGNOSIS — G4486 Cervicogenic headache: Secondary | ICD-10-CM

## 2019-09-25 DIAGNOSIS — F0630 Mood disorder due to known physiological condition, unspecified: Secondary | ICD-10-CM

## 2019-09-25 DIAGNOSIS — R519 Headache, unspecified: Secondary | ICD-10-CM

## 2019-09-25 DIAGNOSIS — M545 Low back pain: Secondary | ICD-10-CM

## 2019-09-25 DIAGNOSIS — J9621 Acute and chronic respiratory failure with hypoxia: Secondary | ICD-10-CM | POA: Diagnosis present
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
## 2019-09-29 ENCOUNTER — Telehealth: Payer: Self-pay | Admitting: Pain Medicine

## 2019-09-29 ENCOUNTER — Other Ambulatory Visit: Payer: Self-pay | Admitting: Pain Medicine

## 2019-09-29 DIAGNOSIS — G894 Chronic pain syndrome: Secondary | ICD-10-CM

## 2019-09-29 NOTE — Telephone Encounter (Signed)
Changed to 10-01-19 at 11:45

## 2019-09-29 NOTE — Telephone Encounter (Signed)
Patient called to have her pain med and muscle relaxer called in/ states Dr. Dossie Arbour only gave her 1 month. Please let her know when she can pick them up.

## 2019-09-29 NOTE — Telephone Encounter (Signed)
Her meds were to last until 10-03-19. Can you reschedule her to a date before then?

## 2019-10-01 ENCOUNTER — Ambulatory Visit: Payer: Medicare Other | Attending: Pain Medicine | Admitting: Pain Medicine

## 2019-10-01 ENCOUNTER — Other Ambulatory Visit: Payer: Self-pay

## 2019-10-01 DIAGNOSIS — M25562 Pain in left knee: Secondary | ICD-10-CM

## 2019-10-01 DIAGNOSIS — G894 Chronic pain syndrome: Secondary | ICD-10-CM | POA: Diagnosis not present

## 2019-10-01 DIAGNOSIS — M25551 Pain in right hip: Secondary | ICD-10-CM

## 2019-10-01 DIAGNOSIS — M25561 Pain in right knee: Secondary | ICD-10-CM

## 2019-10-01 DIAGNOSIS — R519 Headache, unspecified: Secondary | ICD-10-CM | POA: Diagnosis not present

## 2019-10-01 DIAGNOSIS — G8929 Other chronic pain: Secondary | ICD-10-CM

## 2019-10-01 DIAGNOSIS — M5441 Lumbago with sciatica, right side: Secondary | ICD-10-CM

## 2019-10-01 DIAGNOSIS — M170 Bilateral primary osteoarthritis of knee: Secondary | ICD-10-CM

## 2019-10-01 DIAGNOSIS — R209 Unspecified disturbances of skin sensation: Secondary | ICD-10-CM

## 2019-10-01 DIAGNOSIS — M25552 Pain in left hip: Secondary | ICD-10-CM

## 2019-10-01 DIAGNOSIS — M7918 Myalgia, other site: Secondary | ICD-10-CM

## 2019-10-01 MED ORDER — OXYCODONE HCL 10 MG PO TABS: 10.0000 mg | ORAL_TABLET | Freq: Three times a day (TID) | ORAL | 0 refills | Status: DC | PRN

## 2019-10-01 MED ORDER — DICLOFENAC SODIUM 1 % TD GEL: 4.0000 g | Freq: Four times a day (QID) | TRANSDERMAL | 7 refills | Status: DC

## 2019-10-01 MED ORDER — LIDOCAINE 5 % EX OINT: 1.0000 "application " | TOPICAL_OINTMENT | Freq: Four times a day (QID) | CUTANEOUS | 4 refills | Status: DC | PRN

## 2019-10-01 MED ORDER — TIZANIDINE HCL 4 MG PO TABS: 4.0000 mg | ORAL_TABLET | Freq: Three times a day (TID) | ORAL | 5 refills | Status: DC

## 2019-10-01 NOTE — Progress Notes (Signed)
Pain Management Virtual Encounter Note - Virtual Visit via Telephone Telehealth (real-time audio visits between healthcare provider and patient).   Patient's Phone No. & Preferred Pharmacy:  [phone] (home); [phone] (mobile); (Preferred) [phone] No e-mail address on record  [facility] - [place], [place] - [address] [place] [address] Phone: [phone] Fax: [phone]    Pre-screening note:  Our staff contacted Jean Gomez and offered her an "in person", "face-to-face" appointment versus a telephone encounter. She indicated preferring the telephone encounter, at this time.   Reason for Virtual Visit: COVID-19*  Social distancing based on CDC and AMA recommendations.   I contacted Jean Gomez on 10/01/2019 via telephone.      I clearly identified myself as Oswaldo Done, MD. I verified that I was speaking with the correct person using two identifiers (Name: Jean Gomez, and date of birth: 02-22-1956).  Advanced Informed Consent I sought verbal advanced consent from Jean Gomez for virtual visit interactions. I informed Jean Gomez of possible security and privacy concerns, risks, and limitations associated with providing "not-in-person" medical evaluation and management services. I also informed Jean Gomez of the availability of "in-person" appointments. Finally, I informed her that there would be a charge for the virtual visit and that she could be  personally, fully or partially, financially responsible for it. Jean Gomez expressed understanding and agreed to proceed.   Historic Elements   Jean Gomez is a 63 y.o. year old, female patient evaluated today after her last encounter by our practice on 09/29/2019. Jean Gomez  has a past medical history of Acute postoperative pain (01/16/2019), Allergy, Asthma, Cerebral aneurysm, Chest pain (11/01/2016), Chronic migraine, Chronic respiratory failure (HCC), COPD (chronic  obstructive pulmonary disease) (HCC), Depression, Hypertension, Osteoarthritis, Pneumonia, Sarcoid, and Seizures (HCC). She also  has a past surgical history that includes Cholecystectomy; Abdominal hysterectomy; Spinal fusion; Back surgery; Esophagogastroduodenoscopy (egd) with propofol (N/A, 08/19/2019); and Colonoscopy with propofol (N/A, 08/19/2019). Jean Gomez has a current medication list which includes the following prescription(s): albuterol, albuterol sulfate, alprazolam, amlodipine, aspirin ec, aspirin-calcium carbonate, atorvastatin, betamethasone (augmented), budesonide-formoterol, buspirone, calcium carbonate, citalopram, clotrimazole-betamethasone, clotrimazole-betamethasone, dexilant, diclofenac sodium, diphenhydramine, doxycycline, medical compression stockings, hydrochlorothiazide, hydroxyzine, ipratropium, lactulose, levetiracetam, lidocaine, linaclotide, losartan, mirtazapine, nitroglycerin, olmesartan, ondansetron, oxycodone hcl, oxycodone hcl, oxycodone hcl, oxycodone-acetaminophen, pantoprazole, potassium chloride sa, promethazine, quetiapine, quetiapine, sucralfate, tizanidine, torsemide, trazodone, and venlafaxine. She  reports that she has never smoked. She has never used smokeless tobacco. She reports current alcohol use. She reports that she does not use drugs. Jean Gomez is allergic to contrast media [iodinated diagnostic agents]; gabapentin; labetalol; sulfabenzamide; fentanyl; and penicillins.   HPI  Today, she is being contacted for both, medication management and a post-procedure assessment.  Post-Procedure Evaluation  Procedure: Diagnostic right-sided L5 TFESI #1 + left-sided L4-5 LESI #1 under fluoroscopic guidance and IV sedation Pre-procedure pain level:  8/10 Post-procedure: 1/10 (100% relief)  Sedation: Sedation provided.  Effectiveness during initial hour after procedure(Ultra-Short Term Relief):   100%  Local anesthetic used: Long-acting (4-6 hours) Effectiveness:  Defined as any analgesic benefit obtained secondary to the administration of local anesthetics. This carries significant diagnostic value as to the etiological location, or anatomical origin, of the pain. Duration of benefit is expected to coincide with the duration of the local anesthetic used.  Effectiveness during initial 4-6 hours after procedure(Short-Term Relief):   100%  Long-term benefit: Defined as any relief past the pharmacologic duration of the local anesthetics.  Effectiveness past the initial 6 hours after procedure(Long-Term Relief):   97%  Current benefits: Defined as benefit that persist at this time.   Analgesia:  >50% relief Function: Jean Gomez reports improvement in function ROM: Jean Gomez reports improvement in ROM  Pharmacotherapy Assessment  Analgesic: Hydrocodone/acetaminophen 10/325 3 times daily (30 mg/day of Hydrocodone)(enough until 09/16/2019) today we will be switching the patient to oxycodone IR 10 mg 1 tablet p.o. every 8 hours (30 mg/day of oxycodone) MME/day:30mg /day.  This will be increased to 45 MME/day by the use of the oxycodone.   Monitoring: Pharmacotherapy: No side-effects or adverse reactions reported. [place] PMP: PDMP reviewed during this encounter.       Compliance: No problems identified. Effectiveness: Clinically acceptable. Plan: Refer to "POC".  UDS:  Summary  Date Value Ref Range Status  03/27/2018 FINAL  Final    Comment:    ==================================================================== TOXASSURE SELECT 13 (MW) ==================================================================== Test                             Result       Flag       Units Drug Present and Declared for Prescription Verification   Hydrocodone                    646          EXPECTED   ng/mg creat   Hydromorphone                  367          EXPECTED   ng/mg creat   Dihydrocodeine                 317          EXPECTED   ng/mg creat   Norhydrocodone                  2063         EXPECTED   ng/mg creat    Sources of hydrocodone include scheduled prescription    medications. Hydromorphone, dihydrocodeine and norhydrocodone are    expected metabolites of hydrocodone. Hydromorphone and    dihydrocodeine are also available as scheduled prescription    medications. ==================================================================== Test                      Result    Flag   Units      Ref Range   Creatinine              24               mg/dL      >=20 ==================================================================== Declared Medications:  The flagging and interpretation on this report are based on the  following declared medications.  Unexpected results may arise from  inaccuracies in the declared medications.  **Note: The testing scope of this panel includes these medications:  Hydrocodone (Hydrocodone-Acetaminophen)  **Note: The testing scope of this panel does not include following  reported medications:  Acetaminophen (Hydrocodone-Acetaminophen)  Albuterol  Amlodipine  Aspirin (Aspirin 81)  Atorvastatin  Budenoside (Symbicort)  Clotrimazole (Lotrisone)  Diclofenac  Escitalopram  Formoterol (Symbicort)  Hydrochlorothiazide (HCTZ)  Ipratropium  Ketoconazole  Lactulose  Levetiracetam  Losartan (Losartan Potassium)  Lubiprostone  Mirtazapine  Nitroglycerin  Ondansetron  Pantoprazole  Potassium  Sucralfate  Tizanidine  Topical  Torsemide  Venlafaxine  Vitamin D2 (Ergocalciferol) ==================================================================== For clinical consultation, please call [phone]. ====================================================================    Laboratory Chemistry Profile (12 mo)  Renal: 07/14/2019: BUN 15; Creatinine, Ser 0.61  Lab Results  Component Value Date   GFRAA >60 07/14/2019   GFRNONAA >60 07/14/2019   Hepatic: 07/14/2019: Albumin 4.9 Lab Results  Component Value Date   AST 23  07/14/2019   ALT 17 07/14/2019   Other: No results found for requested labs within last 8760 hours. Note: Above Lab results reviewed.  Imaging  Last 90 days:  Dg Chest 2 View  Result Date: 07/14/2019 CLINICAL DATA:  Pt reports coughing blood and passing blood in urine since last night. Hx of asthma, COPD, HTN. Sarcoidosis. Nonsmoker. EXAM: CHEST - 2 VIEW COMPARISON:  12/29/2018 FINDINGS: Heart size is normal. There is architectural distortion and prominence of interstitial markings particularly within the perihilar regions and UPPER lobes. Scarring identified at both lung bases. There are no focal consolidations or pleural effusions. No pulmonary edema. IMPRESSION: Chronic changes. No evidence for acute abnormality. Electronically Signed   By: Norva Pavlov M.D.   On: 07/14/2019 13:42   [facility] C-arm 1-60 Min No Report  Result Date: 09/23/2019 Fluoro was used, but no Radiologist interpretation will be provided. Please refer to "NOTES" tab for provider progress note.  Ct Renal Stone Study  Result Date: 07/14/2019 CLINICAL DATA:  Hematuria.  Lower abdominal pain EXAM: CT ABDOMEN AND PELVIS WITHOUT CONTRAST TECHNIQUE: Multidetector CT imaging of the abdomen and pelvis was performed following the standard protocol without IV contrast. COMPARISON:  CT abdomen pelvis 08/18/2014 FINDINGS: Lower chest: Mild scarring in the right lung base. No acute infiltrate or effusion Hepatobiliary: Postop cholecystectomy. Common bile duct mildly prominent 10 mm. No liver lesion. Pancreas: Negative Spleen: Negative Adrenals/Urinary Tract: Mild fullness right renal pelvis. Right ureter nondilated. 3 mm calculus anterior to the iliac vessels on the right appears to be within the ureter but could be a phlebolith. This was not present previously. Left kidney normal. No other right renal calculi. Normal urinary bladder. Stomach/Bowel: Negative for bowel obstruction. No bowel obstruction or edema.  Vascular/Lymphatic: Minimal atherosclerotic disease in the aorta. No lymphadenopathy Reproductive: Hysterectomy.  No pelvic mass. Other: No free fluid Musculoskeletal: Mild degenerative changes lumbar spine without acute abnormality. IMPRESSION: 3 mm calcification anterior to the iliac vessels in the right, probable ureteral calculus versus phlebolith. Mild fullness of the right renal pelvis. No other renal calculi. Electronically Signed   By: Marlan Palau M.D.   On: 07/14/2019 16:04    Assessment  The primary encounter diagnosis was Chronic pain syndrome. 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  Plan of Care  I am having Jean Gomez start on Oxycodone HCl and Oxycodone HCl. I am also having her maintain her Albuterol Sulfate, betamethasone (augmented), ipratropium, nitroGLYCERIN, aspirin EC, amLODipine, hydrochlorothiazide, losartan, pantoprazole, sucralfate, potassium chloride SA, clotrimazole-betamethasone, venlafaxine, levETIRAcetam, atorvastatin, linaclotide, citalopram, ondansetron, diphenhydrAMINE, torsemide, Medical Compression Stockings, QUEtiapine, ALPRAZolam, busPIRone, doxycycline, mirtazapine, olmesartan, oxyCODONE-acetaminophen, albuterol, Aspirin-Calcium Carbonate, budesonide-formoterol, calcium carbonate, clotrimazole-betamethasone, lactulose, QUEtiapine, traZODone, Dexilant, hydrOXYzine, promethazine, Oxycodone HCl, tiZANidine, lidocaine, and diclofenac sodium.  Pharmacotherapy (Medications Ordered): Meds ordered this encounter  Medications  . Oxycodone HCl 10 MG TABS    Sig: Take 1 tablet (  10 mg total) by mouth  every 8 (eight) hours as needed. Must last 30 days.    Dispense:  90 tablet    Refill:  0    Chronic Pain: STOP Act (Not applicable) Fill 1 day early if closed on refill date. Do not fill until: 10/01/2019. To last until: 11/02/2019. Avoid benzodiazepines within 8 hours of opioids  . Oxycodone HCl 10 MG TABS    Sig: Take 1 tablet (10 mg total) by mouth every 8 (eight) hours as needed. Must last 30 days.    Dispense:  90 tablet    Refill:  0    Chronic Pain: STOP Act (Not applicable) Fill 1 day early if closed on refill date. Do not fill until: 11/02/2019. To last until: 12/02/2019. Avoid benzodiazepines within 8 hours of opioids  . Oxycodone HCl 10 MG TABS    Sig: Take 1 tablet (10 mg total) by mouth every 8 (eight) hours as needed. Must last 30 days.    Dispense:  90 tablet    Refill:  0    Chronic Pain: STOP Act (Not applicable) Fill 1 day early if closed on refill date. Do not fill until: 12/02/2019. To last until: 01/01/2020. Avoid benzodiazepines within 8 hours of opioids  . tiZANidine (ZANAFLEX) 4 MG tablet    Sig: Take 1 tablet (4 mg total) by mouth 3 (three) times daily.    Dispense:  90 tablet    Refill:  5    Fill one day early if pharmacy is closed on scheduled refill date. May substitute for generic if available.  . lidocaine (XYLOCAINE) 5 % ointment    Sig: Apply 1 application topically 4 (four) times daily as needed for moderate pain. 1 application = 6 in. of ointment = 5 g (Max: 20 g/day)    Dispense:  35.44 g    Refill:  4    Please instruct patient on correct application. Fill one day early if pharmacy is closed on scheduled refill date. May substitute for generic if available.  . diclofenac sodium (VOLTAREN) 1 % GEL    Sig: Apply 4 g topically 4 (four) times daily.    Dispense:  350 g    Refill:  7    Fill one day early if pharmacy is closed on scheduled refill date. May substitute for generic if available.   Orders:  Orders Placed This Encounter  Procedures  . HIP  INJECTION    Standing Status:   Future    Standing Expiration Date:   10/31/2019    Scheduling Instructions:     Side: Right-sided     Sedation: With Sedation.     Timeframe: As soon as schedule allows   Follow-up plan:   Return for Procedure (w/ sedation): (R) Hip inj..      Considering: NOTE: CONTRAST & FENTANYL Allergy. Diagnostic right-sided L5 TFESI #2  Diagnostic left-sided L4-5 interlaminar LESI #2  Possible bilateral occipital nerve RFA Possible bilateral occipital nerve peripheral nerves stimulator trial Diagnostic bilateral cervical facet block Possible bilateral cervical facet RFA Diagnostic right-sided cervical epidural steroid injection Diagnostic bilateral intra-articular shoulder joint injection Diagnostic bilateral suprascapular nerve block Possible bilateral suprascapular nerve RFA Diagnostic right-sided lumbar epidural steroid injection Diagnostic bilateral transforaminal epidural steroid injection Diagnostic bilateral intra-articular hip joint injection Diagnostic bilateral femoral nerve + obturator nerve block Possible bilateral femoral nerve + obturator nerve RFA Diagnostic bilateral genicular nerve block Possible bilateral genicular nerve RFA   Palliative PRN treatment(s): Palliative bilateral lumbar facet blocks  Palliative bilateral lumbar facet RFA #2   Diagnostic bilateral GONB#2 Palliative bilateral IA knee joint injection (w/ steroid)  Palliative bilateral IA Hyalgan knee injections(series#2) (series#1completed on 07/04/2018)       Recent Visits Date Type Provider Dept  09/23/19 Procedure visit Delano MetzNaveira, Arlind Klingerman, MD [facility]  09/03/19 Office Visit Delano MetzNaveira, Johndavid Geralds, MD [facility]  07/10/19 Office Visit Delano MetzNaveira, Lilton Pare, MD [facility]  Showing recent visits within past 90 days and meeting all other requirements   Today's Visits Date Type Provider Dept  10/01/19 Office Visit  Delano MetzNaveira, Khadijatou Borak, MD [facility]  Showing today's visits and meeting all other requirements   Future Appointments No visits were found meeting these conditions.  Showing future appointments within next 90 days and meeting all other requirements   I discussed the assessment and treatment plan with the patient. The patient was provided an opportunity to ask questions and all were answered. The patient agreed with the plan and demonstrated an understanding of the instructions.  Patient advised to call back or seek an in-person evaluation if the symptoms or condition worsens.  Total duration of non-face-to-face encounter: 15 minutes.  Note by: Oswaldo DoneFrancisco A Andrewjames Weirauch, MD Date: 10/01/2019; Time: 1:46 PM  Note: This dictation was prepared with Dragon dictation. Any transcriptional errors that may result from this process are unintentional.  Disclaimer:  * Given the special circumstances of the COVID-19 pandemic, the federal government has announced that the Office for Civil Rights (OCR) will exercise its enforcement discretion and will not impose penalties on physicians using telehealth in the event of noncompliance with regulatory requirements under the [employer] Insurance Portability and Accountability Act (HIPAA) in connection with the good faith provision of telehealth during the COVID-19 national public health emergency. (AMA)

## 2019-10-01 NOTE — Patient Instructions (Signed)

## 2019-10-06 ENCOUNTER — Ambulatory Visit: Payer: Medicare Other | Admitting: Pain Medicine

## 2019-10-08 ENCOUNTER — Emergency Department
Admission: EM | Admit: 2019-10-08 | Discharge: 2019-10-08 | Disposition: A | Discharge status: Home / Self Care | Payer: Medicare Other | Attending: Emergency Medicine | Admitting: Emergency Medicine

## 2019-10-08 ENCOUNTER — Other Ambulatory Visit: Payer: Self-pay

## 2019-10-08 ENCOUNTER — Encounter: Payer: Self-pay | Admitting: Emergency Medicine

## 2019-10-08 ENCOUNTER — Emergency Department: Payer: Medicare Other

## 2019-10-08 DIAGNOSIS — R1030 Lower abdominal pain, unspecified: Secondary | ICD-10-CM | POA: Diagnosis not present

## 2019-10-08 DIAGNOSIS — R319 Hematuria, unspecified: Secondary | ICD-10-CM

## 2019-10-08 DIAGNOSIS — Z7982 Long term (current) use of aspirin: Secondary | ICD-10-CM | POA: Insufficient documentation

## 2019-10-08 DIAGNOSIS — Z79899 Other long term (current) drug therapy: Secondary | ICD-10-CM | POA: Insufficient documentation

## 2019-10-08 DIAGNOSIS — J449 Chronic obstructive pulmonary disease, unspecified: Secondary | ICD-10-CM | POA: Diagnosis not present

## 2019-10-08 DIAGNOSIS — R309 Painful micturition, unspecified: Secondary | ICD-10-CM | POA: Diagnosis not present

## 2019-10-08 DIAGNOSIS — I10 Essential (primary) hypertension: Secondary | ICD-10-CM | POA: Insufficient documentation

## 2019-10-08 DIAGNOSIS — N390 Urinary tract infection, site not specified: Secondary | ICD-10-CM

## 2019-10-08 LAB — CBC
HCT: 30.9 % — ABNORMAL LOW (ref 36.0–46.0)
Hemoglobin: 9.7 g/dL — ABNORMAL LOW (ref 12.0–15.0)
MCH: 26.9 pg (ref 26.0–34.0)
MCHC: 31.4 g/dL (ref 30.0–36.0)
MCV: 85.6 fL (ref 80.0–100.0)
Platelets: 313 10*3/uL (ref 150–400)
RBC: 3.61 MIL/uL — ABNORMAL LOW (ref 3.87–5.11)
RDW: 12.7 % (ref 11.5–15.5)
WBC: 9.4 10*3/uL (ref 4.0–10.5)
nRBC: 0 % (ref 0.0–0.2)

## 2019-10-08 LAB — URINALYSIS, COMPLETE (UACMP) WITH MICROSCOPIC
Bacteria, UA: NONE SEEN
Bilirubin Urine: NEGATIVE
Glucose, UA: NEGATIVE mg/dL
Ketones, ur: NEGATIVE mg/dL
Nitrite: NEGATIVE
Protein, ur: 100 mg/dL — AB
RBC / HPF: >50 RBC/hpf — ABNORMAL HIGH (ref 0–5)
Specific Gravity, Urine: 1.025 (ref 1.005–1.030)
WBC, UA: >50 WBC/hpf — ABNORMAL HIGH (ref 0–5)
pH: 5 (ref 5.0–8.0)

## 2019-10-08 LAB — COMPREHENSIVE METABOLIC PANEL
ALT: 21 U/L (ref 0–44)
AST: 20 U/L (ref 15–41)
Albumin: 4.4 g/dL (ref 3.5–5.0)
Alkaline Phosphatase: 57 U/L (ref 38–126)
Anion gap: 11 (ref 5–15)
BUN: 19 mg/dL (ref 8–23)
CO2: 26 mmol/L (ref 22–32)
Calcium: 9.3 mg/dL (ref 8.9–10.3)
Chloride: 100 mmol/L (ref 98–111)
Creatinine, Ser: 0.81 mg/dL (ref 0.44–1.00)
GFR calc Af Amer: >60 mL/min (ref >60)
GFR calc non Af Amer: >60 mL/min (ref >60)
Glucose, Bld: 116 mg/dL — ABNORMAL HIGH (ref 70–99)
Potassium: 3.6 mmol/L (ref 3.5–5.1)
Sodium: 137 mmol/L (ref 135–145)
Total Bilirubin: 0.5 mg/dL (ref 0.3–1.2)
Total Protein: 7.3 g/dL (ref 6.5–8.1)

## 2019-10-08 LAB — LIPASE, BLOOD: Lipase: 19 U/L (ref 11–51)

## 2019-10-08 MED ORDER — CEPHALEXIN 500 MG PO CAPS: 500.0000 mg | ORAL_CAPSULE | Freq: Two times a day (BID) | ORAL | 0 refills | Status: AC

## 2019-10-08 MED ORDER — SODIUM CHLORIDE 0.9 % IV SOLN
1.0000 g | Freq: Once | INTRAVENOUS | Status: AC
  Administered 2019-10-08: 1 g via INTRAVENOUS
  Filled 2019-10-08: qty 10

## 2019-10-08 MED ORDER — OXYCODONE-ACETAMINOPHEN 5-325 MG PO TABS
1.0000 | ORAL_TABLET | Freq: Once | ORAL | Status: AC
  Administered 2019-10-08: 1 via ORAL
  Filled 2019-10-08: qty 1

## 2019-10-08 MED ORDER — LACTATED RINGERS IV BOLUS
1000.0000 mL | Freq: Once | INTRAVENOUS | Status: AC
  Administered 2019-10-08: 19:00:00 1000 mL via INTRAVENOUS

## 2019-10-08 NOTE — ED Notes (Signed)
Pt verbalized understanding of discharge instructions. NAD at this time. 

## 2019-10-08 NOTE — ED Notes (Signed)
Iv team at bedside. Pt ringing call bell to ask for food. MD notified and pt cleared by MD to eat and drink at this time.

## 2019-10-08 NOTE — ED Provider Notes (Signed)
[facility] Emergency Department Provider Note   ____________________________________________   First MD Initiated Contact with Patient 10/08/19 1539     (approximate)  I have reviewed the triage vital signs and the nursing notes.   HISTORY  Chief Complaint Hematuria and Abdominal Pain    HPI Jean Gomez is a 63 y.o. female with past medical history of chronic back pain, COPD on 4 L nasal cannula, and seizures presents to the ED complaining of hematuria and abdominal pain.  Patient reports that she initially developed burning when she urinates 3 to 4 days ago associated with some suprapubic abdominal pain.  Over the past 24 hours, she has begun to notice gross blood in her urine as well as a couple of clots.  She denies any associated flank pain and has not had any fevers, nausea, or vomiting.  She describes symptoms as similar to prior urinary tract infections, but has not noticed blood in her urine before.  Do not suspect pyelonephritis and work-up not consistent with sepsis.        Past Medical History:  Diagnosis Date  . Acute postoperative pain 01/16/2019  . Allergy   . Asthma   . Cerebral aneurysm   . Chest pain 11/01/2016  . Chronic migraine   . Chronic respiratory failure (HCC)   . COPD (chronic obstructive pulmonary disease) (HCC)   . Depression    major depression in remission  . Hypertension   . Osteoarthritis   . Pneumonia    12/2018  . Sarcoid   . Seizures [facility])     Patient Active Problem List   Diagnosis Date Noted  . Lumbar foraminal stenosis 07/10/2019  . Abnormal MRI, lumbar spine 07/03/2019  . Disturbance of skin sensation 06/16/2019  . Chronic low back pain (Secondary Area of Pain) (Bilateral) (R>L) w/ sciatica 01/16/2019  . Acute on chronic respiratory failure with hypoxia (HCC) 12/18/2018  . Cervicogenic headache 10/21/2018  . Lumbar spondylosis 06/25/2018  . History of allergy to radiographic contrast media  05/30/2018  . Bipolar affective disorder, current episode manic (HCC) 04/17/2018  . Chronic hip pain [facility] Area of Pain) (Bilateral) (R>L) 02/26/2018  . DDD (degenerative disc disease), cervical 02/26/2018  . Cervical facet arthropathy (Bilateral) 02/26/2018  . Cervical facet syndrome (Bilateral) (R>L) 02/26/2018  . Cervical Grade 1 Anterolisthesis of C4 over C5 (Degenerative) 02/26/2018  . Cervical Grade 1 Retrolisthesis of C5 over C6 02/26/2018  . Cervical foraminal stenosis (C3-4, C4-5, and C5-6) (Bilateral) 02/26/2018  . DDD (degenerative disc disease), lumbosacral 02/26/2018  . Osteoarthritis of lumbar spine 02/26/2018  . Osteoarthritis of facet joint of lumbar spine 02/26/2018  . Spondylosis without myelopathy or radiculopathy, cervicothoracic region 02/26/2018  . Occipital headache (Right) 02/25/2018  . Frontal headache (Left) 02/25/2018  . Chronic low back pain (Midline) (Secondary Area of Pain) 02/25/2018  . Chronic lumbar radiculopathy (Right) 02/25/2018  . Osteoarthritis involving multiple joints 02/25/2018  . Osteoarthritis of shoulder (Bilateral) 02/25/2018  . Chronic musculoskeletal pain 02/25/2018  . Lumbar facet syndrome (Bilateral) (R>L) 02/25/2018  . Vitamin D deficiency 02/14/2018  . Chronic pain syndrome 02/07/2018  . Pharmacologic therapy 02/07/2018  . Disorder of skeletal system 02/07/2018  . Problems influencing health status 02/07/2018  . Long term current use of opiate analgesic 02/07/2018  . Chronic low back pain (Secondary Area of Pain) (Bilateral) (R>L) w/ sciatica (Right) 02/07/2018  . Chronic lower extremity pain [facility] Area of Pain) (Bilateral) (R>L) 02/07/2018  . Chronic knee pain (Fourth Area  of Pain) (Bilateral) (R>L) 02/07/2018  . Chronic shoulder pain (Fifth Area of Pain) (Bilateral) (R>L) 02/07/2018  . Occipital neuralgia (Primary Area of Pain) (Bilateral) (R>L) 02/07/2018  . Chronic neck pain (Bilateral) (R>L) 02/07/2018  . Chronic  sacroiliac joint pain (Right) 02/07/2018  . H/O aneurysm 01/30/2018  . History of depression 01/30/2018  . History of seizure disorder 01/30/2018  . Chronic headache disorder (Primary Area of Pain) 01/30/2018  . Chronic migraine without aura 01/17/2018  . Chronic respiratory failure with hypoxia (HCC) 01/17/2018  . Constipation 01/17/2018  . HTN, goal below 140/90 01/17/2018  . Hypercholesterolemia 01/17/2018  . Major depression in remission (HCC) 01/17/2018  . COPD (chronic obstructive pulmonary disease) (HCC) 01/17/2018  . Sarcoidosis 01/17/2018  . Seizure disorder (HCC) 01/17/2018  . Nonruptured cerebral aneurysm 10/22/2017  . Obstructive sleep apnea on CPAP 09/11/2017  . Generalized anxiety disorder 01/26/2017  . Irritable bowel syndrome 01/26/2017  . Pulmonary emphysema (HCC) 01/26/2017  . GERD (gastroesophageal reflux disease) 08/02/2016  . Hiatal hernia 08/02/2016  . Essential tremor 07/19/2016  . Muscle spasm 10/05/2015  . Spondylosis without myelopathy or radiculopathy, lumbosacral region 10/05/2015  . Rotator cuff injury 09/17/2015  . Chronic shoulder pain (Right) 09/17/2015  . Epilepsy (HCC) 07/14/2015  . Insomnia 07/14/2015  . Osteoarthritis of knee (Bilateral) (R>L) 06/24/2015    Past Surgical History:  Procedure Laterality Date  . ABDOMINAL HYSTERECTOMY    . BACK SURGERY     spine fusion anterior lumbar with open posterior  . CHOLECYSTECTOMY    . COLONOSCOPY WITH PROPOFOL N/A 08/19/2019   Procedure: COLONOSCOPY WITH PROPOFOL;  Surgeon: Christena DeemSkulskie, Martin U, MD;  [facility];  Service: Endoscopy;  Laterality: N/A;  . ESOPHAGOGASTRODUODENOSCOPY (EGD) WITH PROPOFOL N/A 08/19/2019   Procedure: ESOPHAGOGASTRODUODENOSCOPY (EGD) WITH PROPOFOL;  Surgeon: Christena DeemSkulskie, Martin U, MD;  [facility];  Service: Endoscopy;  Laterality: N/A;  . SPINAL FUSION      Prior to Admission medications   Medication Sig Start Date End Date Taking? Authorizing Provider   albuterol (PROVENTIL) (2.5 MG/3ML) 0.083% nebulizer solution Take 2.5 mg by nebulization every 6 (six) hours as needed for wheezing or shortness of breath.    [provider]  Albuterol Sulfate 108 (90 Base) MCG/ACT AEPB Inhale 2 puffs into the lungs every 6 (six) hours as needed.    [provider]  ALPRAZolam Prudy Feeler(XANAX) 0.25 MG tablet  04/16/19   [provider]  amLODipine (NORVASC) 10 MG tablet Take 1 tablet (10 mg total) by mouth daily. 04/19/18   Clapacs, Jackquline DenmarkJohn T, MD  aspirin EC 81 MG tablet Take 1 tablet (81 mg total) by mouth daily. 04/19/18   Clapacs, Jackquline DenmarkJohn T, MD  Aspirin-Calcium Carbonate [phone] MG TABS Take by mouth.    [provider]  atorvastatin (LIPITOR) 20 MG tablet Take 20 mg by mouth daily.    [provider]  betamethasone, augmented, (DIPROLENE) 0.05 % lotion Apply topically 2 (two) times daily.    [provider]  budesonide-formoterol (SYMBICORT) 160-4.5 MCG/ACT inhaler Inhale into the lungs.    [provider]  busPIRone (BUSPAR) 15 MG tablet  06/06/19   [provider]  calcium carbonate (OS-CAL) 1250 (500 Ca) MG chewable tablet Chew by mouth daily.    [provider]  cephALEXin (KEFLEX) 500 MG capsule Take 1 capsule (500 mg total) by mouth 2 (two) times daily for 7 days. 10/08/19 10/15/19  Chesley NoonJessup,  [place], MD  citalopram (CELEXA) 20 MG tablet Take 20 mg by mouth daily.  [provider]  clotrimazole-betamethasone (LOTRISONE) cream Apply 1 application topically 2 (two) times daily. 04/19/18   Clapacs, Jackquline Denmark, MD  clotrimazole-betamethasone (LOTRISONE) cream Apply 1 application topically 2 (two) times daily.    [provider]  dexlansoprazole (DEXILANT) 60 MG capsule Take 60 mg by mouth daily.    [provider]  diclofenac sodium (VOLTAREN) 1 % GEL Apply 4 g topically 4 (four) times daily. 10/01/19 03/29/20  Delano Metz, MD  diphenhydrAMINE (BENADRYL) 25 MG tablet Take  25 mg by mouth every 6 (six) hours as needed.    [provider]  doxycycline (PERIOSTAT) 20 MG tablet  05/06/19   [provider]  Elastic Bandages & Supports (MEDICAL COMPRESSION STOCKINGS) MISC Please provide compression stockings 12/30/18   Minna Antis, MD  hydrochlorothiazide (HYDRODIURIL) 25 MG tablet Take 1 tablet (25 mg total) by mouth daily. 04/19/18   Clapacs, Jackquline Denmark, MD  hydrOXYzine (ATARAX/VISTARIL) 10 MG tablet Take 10 mg by mouth.    [provider]  ipratropium (ATROVENT) 0.02 % nebulizer solution Take 0.5 mg by nebulization every 4 (four) hours as needed for wheezing or shortness of breath.    [provider]  lactulose (CHRONULAC) 10 GM/15ML solution Take 10 g by mouth 3 (three) times daily.    [provider]  levETIRAcetam (KEPPRA) 500 MG tablet Take 500 mg by mouth 2 (two) times daily. 11/20/18   [provider]  lidocaine (XYLOCAINE) 5 % ointment Apply 1 application topically 4 (four) times daily as needed for moderate pain. 1 application = 6 in. of ointment = 5 g (Max: 20 g/day) 10/01/19 03/29/20  Delano Metz, MD  linaclotide [facility]) 145 MCG CAPS capsule Take 145 mcg by mouth daily before breakfast.    [provider]  losartan (COZAAR) 25 MG tablet Take 1 tablet (25 mg total) by mouth daily. 04/19/18   Clapacs, Jackquline Denmark, MD  mirtazapine (REMERON) 15 MG tablet  03/05/19   [provider]  nitroGLYCERIN (NITROSTAT) 0.4 MG SL tablet Place 0.4 mg under the tongue every 5 (five) minutes as needed for chest pain.    [provider]  olmesartan (BENICAR) 20 MG tablet  04/08/19   [provider]  ondansetron (ZOFRAN) 4 MG tablet Take 4 mg by mouth every 8 (eight) hours as needed for nausea or vomiting.    [provider]  Oxycodone HCl 10 MG TABS Take 1 tablet (10 mg total) by mouth every 8 (eight) hours as needed. Must last 30 days. 10/03/19 11/02/19  Delano Metz, MD   Oxycodone HCl 10 MG TABS Take 1 tablet (10 mg total) by mouth every 8 (eight) hours as needed. Must last 30 days. 11/02/19 12/02/19  Delano Metz, MD  Oxycodone HCl 10 MG TABS Take 1 tablet (10 mg total) by mouth every 8 (eight) hours as needed. Must last 30 days. 12/02/19 01/01/20  Delano Metz, MD  oxyCODONE-acetaminophen (PERCOCET) 5-325 MG tablet Take 1 tablet by mouth every 8 (eight) hours as needed. 07/14/19   Emily Filbert, MD  pantoprazole (PROTONIX) 40 MG tablet Take 1 tablet (40 mg total) by mouth daily. 04/19/18   Clapacs, Jackquline Denmark, MD  potassium chloride SA (K-DUR,KLOR-CON) 20 MEQ tablet Take 1 tablet (20 mEq total) by mouth 2 (two) times daily. 04/19/18   Clapacs, Jackquline Denmark, MD  promethazine (PHENERGAN) 25 MG tablet Take 25 mg by mouth every 6 (six) hours as needed for nausea or vomiting.    [provider]  QUEtiapine (SEROQUEL) 100 MG tablet Take 100 mg by mouth at bedtime.    [provider]  QUEtiapine (SEROQUEL) 400 MG tablet Take 400 mg by mouth at bedtime.    [provider]  sucralfate (CARAFATE) 1 g tablet Take 1 tablet (1 g total) by mouth 4 (four) times daily. 04/19/18   Clapacs, Jackquline Denmark, MD  tiZANidine (ZANAFLEX) 4 MG tablet Take 1 tablet (4 mg total) by mouth 3 (three) times daily. 10/01/19 03/29/20  Delano Metz, MD  torsemide (DEMADEX) 20 MG tablet Take 1 tablet (20 mg total) by mouth daily. 12/30/18   Minna Antis, MD  traZODone (DESYREL) 50 MG tablet Take 50 mg by mouth at bedtime.    [provider]  venlafaxine (EFFEXOR) 75 MG tablet TK 1 T PO BID 09/06/18   [provider]    Allergies Contrast media [iodinated diagnostic agents], Gabapentin, Labetalol, Sulfabenzamide, Fentanyl, and Penicillins  Family History  Problem Relation Age of Onset  . Dementia Mother   . Breast cancer Mother   . Throat cancer Father     Social History Social History   Tobacco Use  . Smoking status: Never Smoker  .  Smokeless tobacco: Never Used  Substance Use Topics  . Alcohol use: Yes    Comment: occasional  . Drug use: No    Review of Systems  Constitutional: No fever/chills Eyes: No visual changes. ENT: No sore throat. Cardiovascular: Denies chest pain. Respiratory: Denies shortness of breath. Gastrointestinal: Positive for abdominal pain.  No nausea, no vomiting.  No diarrhea.  No constipation. Genitourinary: Positive for hematuria and dysuria. Musculoskeletal: Negative for back pain. Skin: Negative for rash. Neurological: Negative for headaches, focal weakness or numbness.  ____________________________________________   PHYSICAL EXAM:  VITAL SIGNS: ED Triage Vitals  Enc Vitals Group     BP 10/08/19 1342 96/68     Pulse Rate 10/08/19 1342 (!) 116     Resp 10/08/19 1342 20     Temp 10/08/19 1342 99 F (37.2 C)     Temp Source 10/08/19 1342 Oral     SpO2 10/08/19 1342 97 %     Weight 10/08/19 1343 155 lb (70.3 kg)     Height 10/08/19 1343  (1.6 m)     Head Circumference --      Peak Flow --      Pain Score 10/08/19 1343 10     Pain Loc --      Pain Edu? --      Excl. in GC? --     Constitutional: Alert and oriented. Eyes: Conjunctivae are normal. Head: Atraumatic. Nose: No congestion/rhinnorhea. Mouth/Throat: Mucous membranes are moist. Neck: Normal ROM Cardiovascular: Normal rate, regular rhythm. Grossly normal heart sounds. Respiratory: Normal respiratory effort.  No retractions. Lungs CTAB. Gastrointestinal: Soft with mild suprapubic tenderness. No distention.  No CVA tenderness bilaterally. Genitourinary: deferred Musculoskeletal: No lower extremity tenderness nor edema. Neurologic:  Normal speech and language. No gross focal neurologic deficits are appreciated. Skin:  Skin is warm, dry and intact. No rash noted. Psychiatric: Mood and affect are normal. Speech and behavior are normal.  ____________________________________________   LABS (all labs ordered  are listed, but only abnormal results are displayed)  Labs Reviewed  COMPREHENSIVE METABOLIC PANEL - Abnormal; Notable for the following components:      Result Value   Glucose, Bld 116 (*)    All other components within normal limits  CBC - Abnormal; Notable for the following components:   RBC  3.61 (*)    Hemoglobin 9.7 (*)    HCT 30.9 (*)    All other components within normal limits  URINALYSIS, COMPLETE (UACMP) WITH MICROSCOPIC - Abnormal; Notable for the following components:   Color, Urine YELLOW (*)    APPearance CLOUDY (*)    Hgb urine dipstick LARGE (*)    Protein, ur 100 (*)    Leukocytes,Ua LARGE (*)    RBC / HPF >50 (*)    WBC, UA >50 (*)    Non Squamous Epithelial PRESENT (*)    All other components within normal limits  URINE CULTURE  LIPASE, BLOOD     PROCEDURES  Procedure(s) performed (including Critical Care):  Procedures   ____________________________________________   INITIAL IMPRESSION / ASSESSMENT AND PLAN / ED COURSE       63 year old female with history of COPD on 4 L nasal cannula, chronic back pain, and seizures presents to the ED complaining of suprapubic pain, dysuria, and hematuria over the past couple of days.  UA is consistent with UTI, patient reports allergy to penicillins but has previously tolerated Rocephin, will give dose of this and culture urine.  Given gross hematuria, will also obtain CT scan to assess for malignancy versus nephrolithiasis.  Unfortunately patient has allergy to IV contrast.  Remainder of labs are unremarkable.  CT scan negative for acute process, no evidence of malignancy or nephrolithiasis.  Patient feeling well after administration of Rocephin, will prescribe Keflex for apparent cystitis with hematuria.  Counseled patient to follow-up with urology if hematuria does not improve following treatment of cystitis.  Counseled patient to return to the ED for new worsening symptoms, patient agrees with plan.       ____________________________________________   FINAL CLINICAL IMPRESSION(S) / ED DIAGNOSES  Final diagnoses:  Lower urinary tract infectious disease  Chronic obstructive pulmonary disease, unspecified COPD type ([place])  Hematuria, unspecified type     ED Discharge Orders         Ordered    cephALEXin (KEFLEX) 500 MG capsule  2 times daily     10/08/19 1934           Note:  This document was prepared using Dragon voice recognition software and may include unintentional dictation errors.   Blake Divine, MD 10/08/19 2020

## 2019-10-08 NOTE — ED Notes (Signed)
Patient transported to CT 

## 2019-10-08 NOTE — ED Notes (Signed)
Pt given peanut butter, saltines, graham crackers, and coke to drink

## 2019-10-08 NOTE — ED Triage Notes (Signed)
Patient reports lower abdominal pain and dysuria x3-4 days. Patient reports today, she noticed blood in her urine.

## 2019-10-09 ENCOUNTER — Ambulatory Visit
Admission: RE | Admit: 2019-10-09 | Discharge: 2019-10-09 | Disposition: A | Discharge status: Home / Self Care | Payer: Medicare Other | Source: Ambulatory Visit | Attending: Pain Medicine | Admitting: Pain Medicine

## 2019-10-09 ENCOUNTER — Encounter: Payer: Self-pay | Admitting: Pain Medicine

## 2019-10-09 ENCOUNTER — Ambulatory Visit (HOSPITAL_BASED_OUTPATIENT_CLINIC_OR_DEPARTMENT_OTHER): Payer: Medicare Other | Admitting: Pain Medicine

## 2019-10-09 VITALS — BP 126/82 | Temp 98.0°F | Resp 16 | Ht 63.0 in | Wt 155.0 lb

## 2019-10-09 DIAGNOSIS — M1611 Unilateral primary osteoarthritis, right hip: Secondary | ICD-10-CM | POA: Diagnosis present

## 2019-10-09 DIAGNOSIS — G894 Chronic pain syndrome: Secondary | ICD-10-CM

## 2019-10-09 DIAGNOSIS — M5416 Radiculopathy, lumbar region: Secondary | ICD-10-CM | POA: Insufficient documentation

## 2019-10-09 DIAGNOSIS — M7061 Trochanteric bursitis, right hip: Secondary | ICD-10-CM | POA: Diagnosis present

## 2019-10-09 DIAGNOSIS — M25552 Pain in left hip: Secondary | ICD-10-CM

## 2019-10-09 DIAGNOSIS — M47816 Spondylosis without myelopathy or radiculopathy, lumbar region: Secondary | ICD-10-CM | POA: Insufficient documentation

## 2019-10-09 DIAGNOSIS — M5127 Other intervertebral disc displacement, lumbosacral region: Secondary | ICD-10-CM | POA: Insufficient documentation

## 2019-10-09 DIAGNOSIS — Z91041 Radiographic dye allergy status: Secondary | ICD-10-CM | POA: Diagnosis present

## 2019-10-09 DIAGNOSIS — M25551 Pain in right hip: Secondary | ICD-10-CM | POA: Diagnosis present

## 2019-10-09 DIAGNOSIS — G8929 Other chronic pain: Secondary | ICD-10-CM | POA: Diagnosis present

## 2019-10-09 DIAGNOSIS — M7138 Other bursal cyst, other site: Secondary | ICD-10-CM | POA: Insufficient documentation

## 2019-10-09 DIAGNOSIS — M4807 Spinal stenosis, lumbosacral region: Secondary | ICD-10-CM | POA: Insufficient documentation

## 2019-10-09 DIAGNOSIS — M4317 Spondylolisthesis, lumbosacral region: Secondary | ICD-10-CM | POA: Insufficient documentation

## 2019-10-09 LAB — URINE CULTURE: Culture: NO GROWTH

## 2019-10-09 MED ORDER — MIDAZOLAM HCL 5 MG/5ML IJ SOLN
INTRAMUSCULAR | Status: AC
  Filled 2019-10-09: qty 5

## 2019-10-09 MED ORDER — LIDOCAINE HCL 2 % IJ SOLN
INTRAMUSCULAR | Status: AC
  Filled 2019-10-09: qty 20

## 2019-10-09 MED ORDER — METHYLPREDNISOLONE ACETATE 80 MG/ML IJ SUSP
INTRAMUSCULAR | Status: AC
  Filled 2019-10-09: qty 1

## 2019-10-09 MED ORDER — LIDOCAINE HCL 2 % IJ SOLN
20.0000 mL | Freq: Once | INTRAMUSCULAR | Status: AC
  Administered 2019-10-09: 10:00:00 400 mg

## 2019-10-09 MED ORDER — ROPIVACAINE HCL 2 MG/ML IJ SOLN
INTRAMUSCULAR | Status: AC
  Filled 2019-10-09: qty 10

## 2019-10-09 MED ORDER — ROPIVACAINE HCL 2 MG/ML IJ SOLN
9.0000 mL | Freq: Once | INTRAMUSCULAR | Status: AC
  Administered 2019-10-09: 9 mL via INTRA_ARTICULAR

## 2019-10-09 MED ORDER — METHYLPREDNISOLONE ACETATE 80 MG/ML IJ SUSP
80.0000 mg | Freq: Once | INTRAMUSCULAR | Status: AC
  Administered 2019-10-09: 10:00:00 80 mg via INTRA_ARTICULAR

## 2019-10-09 MED ORDER — MIDAZOLAM HCL 5 MG/5ML IJ SOLN
1.0000 mg | INTRAMUSCULAR | Status: DC | PRN
  Administered 2019-10-09: 10:00:00 2 mg via INTRAVENOUS

## 2019-10-09 MED ORDER — OXYCODONE HCL 10 MG PO TABS: 10.0000 mg | ORAL_TABLET | Freq: Three times a day (TID) | ORAL | 0 refills | Status: DC | PRN

## 2019-10-09 MED ORDER — DIPHENHYDRAMINE HCL 50 MG/ML IJ SOLN
INTRAMUSCULAR | Status: AC
  Filled 2019-10-09: qty 1

## 2019-10-09 MED ORDER — DIPHENHYDRAMINE HCL 50 MG/ML IJ SOLN
25.0000 mg | Freq: Once | INTRAMUSCULAR | Status: AC
  Administered 2019-10-09: 25 mg via INTRAVENOUS

## 2019-10-09 MED ORDER — LACTATED RINGERS IV SOLN
1000.0000 mL | Freq: Once | INTRAVENOUS | Status: AC
  Administered 2019-10-09: 10:00:00 1000 mL via INTRAVENOUS

## 2019-10-09 NOTE — Patient Instructions (Signed)

## 2019-10-09 NOTE — Progress Notes (Signed)
Patient's Name: Jean Gomez  MRN: [id number]  Referring Provider: Jodi Marble, MD  DOB: 21-Apr-1956  PCP: Jodi Marble, MD  DOS: 10/09/2019  Note by: Gaspar Cola, MD  Service setting: Ambulatory outpatient  Specialty: Interventional Pain Management  Patient type: Established  Location: [facility] (AMB) Pain Management Facility  Visit type: Interventional Procedure   Primary Reason for Visit: Interventional Pain Management Treatment. CC: Hip Pain (right) and Back Pain (lower)  Procedure #1:  Anesthesia, Analgesia, Anxiolysis:  Type: Intra-Articular Hip Injection #1  Primary Purpose: Diagnostic Region: Posterolateral hip joint area. Level: Lower pelvic and hip joint level. Target Area: Superior aspect of the hip joint cavity, going thru the superior portion of the capsular ligament. Approach: Posterolateral approach. Laterality: Right-Sided  Type: Moderate (Conscious) Sedation combined with Local Anesthesia Indication(s): Analgesia and Anxiety Route: Intravenous (IV) IV Access: Secured Sedation: Meaningful verbal contact was maintained at all times during the procedure  Local Anesthetic: Lidocaine 1-2%  Position: Lateral Decubitus with bad side up Area Prepped: Entire Posterolateral hip area. Prepping solution: DuraPrep (Iodine Povacrylex [0.7% available iodine] and Isopropyl Alcohol, 74% w/w)   Procedure #2:    Type: Trochanteric Bursa Injection #1  Primary Purpose: Diagnostic Region: Upper (proximal) Femoral Region Level: Hip Joint Target Area: Superior aspect of the hip joint cavity, going thru the superior portion of the capsular ligament. Approach: Posterolateral approach Laterality: Right     Indications: 1. Chronic hip pain (Tertiary Area of Pain) (Bilateral) (R>L)   2. Osteoarthritis of hip (Right)   3. Greater trochanteric bursitis (Right)   4. History of allergy to radiographic contrast media    Pain Score: Pre-procedure: 8 /10 Post-procedure:  0-No pain/10   The patient was recently seen by [place] neurosurgeon, Dr. Lysle Morales, on 07/22/2019.  His evaluation indicates a concern for the patient's hip joint pain, which seems to predominate the patient's complaints.  After his neurosurgical evaluation of the patient's low back problems, he has indicated that she is not a good candidate for lumbar spinal fusion due to the risk of increased pain after the procedure.  However, he pointed out that the patient may be a suitable candidate for a spinal cord stimulator trial.  This is being taken into consideration.  Today the patient comes then for a diagnostic right hip joint injection to determine how much her joint problems are contributing to her clinical symptoms.  Today the patient indicates that she is actually having pain in both hips and she would like to have x-rays done on both.  Prior to the evaluation by Dr. Lysle Morales the patient had a lumbar MRI.  See the results below.  (07/02/2019) Lumbar MRI FINDINGS: Segmentation: For the purposes of this dictation, five lumbar vertebrae are assumed and the caudal most well-formed intervertebral disc is designated L5-S1. Alignment: Straightening of the expected lumbar lordosis. L5-S1 grade 1 anterolisthesis. Vertebrae: Vertebral body height is maintained. No suspicious osseous lesions. L4 vertebral body hemangioma. Conus medullaris and cauda equina: Conus extends to the L1 level. Conus and cauda equina appear normal. Paraspinal and other soft tissues: Atrophy of the lumbar paraspinal musculature.  Disc levels: Levels of mild-to-moderate disc degeneration greatest at L2-3 and L3-4. Multilevel ventrolateral osteophytes. L1-2: Disc bulge. No significant spinal canal stenosis or neural foraminal narrowing. L2-3: Disc bulge. Superimposed shallow right foraminal disc protrusion. No significant spinal canal stenosis. Moderate right with mild left neural foraminal narrowing. L3-4: Disc bulge. Superimposed  shallow right foraminal disc protrusion. Facet arthrosis/ligamentum flavum hypertrophy.  Mild spinal canal stenosis. Moderate bilateral neural foraminal narrowing. L4-5: Disc bulge. Superimposed small left foraminal disc extrusion. Facet arthrosis/ligamentum flavum hypertrophy. Mild spinal canal stenosis. Moderate right neural foraminal narrowing. Moderate/severe left neural foraminal narrowing with possible impingement of the exiting left L4 nerve root. L5-S1: Grade 1 anterolisthesis with disc uncovering. Facet arthrosis/ligamentum flavum hypertrophy. Small right facet joint effusion with 12 mm right-sided posteriorly projecting synovial facet cyst. Mild spinal canal stenosis. Moderate/severe right neural foraminal narrowing with impingement of the exiting right L5 nerve root. Moderate left neural foraminal narrowing.  Mild right hydronephrosis.  IMPRESSION: - Lumbar spondylosis. No more than mild spinal canal stenosis. At L4-5, a small disc extrusion contributes to moderate/severe left neural foraminal narrowing with possible impingement of the exiting left L4 nerve root. At L5-S1, degenerative grade 1 anterolisthesis contributes to moderate/severe right neural foraminal narrowing with impingement of the exiting right L5 nerve root.  - Additional sites of mild and moderate foraminal narrowing as described.  - Mild right hydroureteronephrosis.   Pre-op Assessment:  Ms. Zea is a 63 y.o. (year old), female patient, seen today for interventional treatment. She  has a past surgical history that includes Cholecystectomy; Abdominal hysterectomy; Spinal fusion; Back surgery; Esophagogastroduodenoscopy (egd) with propofol (N/A, 08/19/2019); and Colonoscopy with propofol (N/A, 08/19/2019). Ms. Bronkema has a current medication list which includes the following prescription(s): albuterol, albuterol sulfate, alprazolam, amlodipine, aspirin ec, aspirin-calcium carbonate, atorvastatin, betamethasone (augmented),  budesonide-formoterol, buspirone, calcium carbonate, cephalexin, citalopram, clotrimazole-betamethasone, clotrimazole-betamethasone, dexilant, diclofenac sodium, diphenhydramine, doxycycline, medical compression stockings, hydrochlorothiazide, hydroxyzine, ipratropium, lactulose, levetiracetam, lidocaine, linaclotide, losartan, mirtazapine, nitroglycerin, olmesartan, ondansetron, oxycodone hcl, oxycodone hcl, oxycodone hcl, oxycodone-acetaminophen, pantoprazole, potassium chloride sa, promethazine, quetiapine, quetiapine, sucralfate, tizanidine, torsemide, trazodone, and venlafaxine, and the following Facility-Administered Medications: midazolam. Her primarily concern today is the Hip Pain (right) and Back Pain (lower)  Initial Vital Signs:  Pulse/HCG Rate:  ECG Heart Rate: 99 Temp: (!) 97.5 F (36.4 C) Resp: 16 BP: 112/73 SpO2: 100 %  BMI: Estimated body mass index is 27.46 kg/m as calculated from the following:   Height as of this encounter:  (1.6 m).   Weight as of this encounter: 155 lb (70.3 kg).  Risk Assessment: Allergies: Reviewed. She is allergic to contrast media [iodinated diagnostic agents]; gabapentin; labetalol; sulfabenzamide; fentanyl; and penicillins.  Allergy Precautions: None required Coagulopathies: Reviewed. None identified.  Blood-thinner therapy: None at this time Active Infection(s): Reviewed. None identified. Ms. Hammett is afebrile  Site Confirmation: Ms. Hyson was asked to confirm the procedure and laterality before marking the site Procedure checklist: Completed Consent: Before the procedure and under the influence of no sedative(s), amnesic(s), or anxiolytics, the patient was informed of the treatment options, risks and possible complications. To fulfill our ethical and legal obligations, as recommended by the American Medical Association's Code of Ethics, I have informed the patient of my clinical impression; the nature and purpose of the treatment or  procedure; the risks, benefits, and possible complications of the intervention; the alternatives, including doing nothing; the risk(s) and benefit(s) of the alternative treatment(s) or procedure(s); and the risk(s) and benefit(s) of doing nothing. The patient was provided information about the general risks and possible complications associated with the procedure. These may include, but are not limited to: failure to achieve desired goals, infection, bleeding, organ or nerve damage, allergic reactions, paralysis, and death. In addition, the patient was informed of those risks and complications associated to the procedure, such as failure to decrease pain; infection; bleeding; organ or nerve damage with  subsequent damage to sensory, motor, and/or autonomic systems, resulting in permanent pain, numbness, and/or weakness of one or several areas of the body; allergic reactions; (i.e.: anaphylactic reaction); and/or death. Furthermore, the patient was informed of those risks and complications associated with the medications. These include, but are not limited to: allergic reactions (i.e.: anaphylactic or anaphylactoid reaction(s)); adrenal axis suppression; blood sugar elevation that in diabetics may result in ketoacidosis or comma; water retention that in patients with history of congestive heart failure may result in shortness of breath, pulmonary edema, and decompensation with resultant heart failure; weight gain; swelling or edema; medication-induced neural toxicity; particulate matter embolism and blood vessel occlusion with resultant organ, and/or nervous system infarction; and/or aseptic necrosis of one or more joints. Finally, the patient was informed that Medicine is not an exact science; therefore, there is also the possibility of unforeseen or unpredictable risks and/or possible complications that may result in a catastrophic outcome. The patient indicated having understood very clearly. We have given the  patient no guarantees and we have made no promises. Enough time was given to the patient to ask questions, all of which were answered to the patient's satisfaction. Ms. Akter has indicated that she wanted to continue with the procedure. Attestation: I, the ordering provider, attest that I have discussed with the patient the benefits, risks, side-effects, alternatives, likelihood of achieving goals, and potential problems during recovery for the procedure that I have provided informed consent. Date   Time: 10/09/2019  9:34 AM  Pre-Procedure Preparation:  Monitoring: As per clinic protocol. Respiration, ETCO2, SpO2, BP, heart rate and rhythm monitor placed and checked for adequate function Safety Precautions: Patient was assessed for positional comfort and pressure points before starting the procedure. Time-out: I initiated and conducted the "Time-out" before starting the procedure, as per protocol. The patient was asked to participate by confirming the accuracy of the "Time Out" information. Verification of the correct person, site, and procedure were performed and confirmed by me, the nursing staff, and the patient. "Time-out" conducted as per Joint Commission's Universal Protocol (UP.01.01.01). Time: 1006  Description of Procedure #1:  Safety Precautions: Aspiration looking for blood return was conducted prior to all injections. At no point did we inject any substances, as a needle was being advanced. No attempts were made at seeking any paresthesias. Safe injection practices and needle disposal techniques used. Medications properly checked for expiration dates. SDV (single dose vial) medications used. Description of the Procedure: Protocol guidelines were followed. The patient was placed in position over the fluoroscopy table. The target area was identified and the area prepped in the usual manner. Skin & deeper tissues infiltrated with local anesthetic. Appropriate amount of time allowed to pass for  local anesthetics to take effect. The procedure needles were then advanced to the target area. Proper needle placement secured. Negative aspiration confirmed. Solution injected in intermittent fashion, asking for systemic symptoms every 0.5cc of injectate. The needles were then removed and the area cleansed, making sure to leave some of the prepping solution back to take advantage of its long term bactericidal properties.  Start Time: 1006 hrs. Materials:  Needle(s) Type: Spinal Needle Gauge: 22G Length: 5.0-in Medication(s): Please see orders for medications and dosing details.  Imaging Guidance (Non-Spinal):          Type of Imaging Technique: Fluoroscopy Guidance (Non-Spinal) Indication(s): Assistance in needle guidance and placement for procedures requiring needle placement in or near specific anatomical locations not easily accessible without such assistance. Exposure Time: Please see  nurses notes. Contrast: Before injecting any contrast, we confirmed that the patient did not have an allergy to iodine, shellfish, or radiological contrast. Once satisfactory needle placement was completed at the desired level, radiological contrast was injected. Contrast injected under live fluoroscopy. No contrast complications. See chart for type and volume of contrast used. Fluoroscopic Guidance: I was personally present during the use of fluoroscopy. "Tunnel Vision Technique" used to obtain the best possible view of the target area. Parallax error corrected before commencing the procedure. "Direction-depth-direction" technique used to introduce the needle under continuous pulsed fluoroscopy. Once target was reached, antero-posterior, oblique, and lateral fluoroscopic projection used confirm needle placement in all planes. Images permanently stored in EMR. Interpretation: I personally interpreted the imaging intraoperatively. Adequate needle placement confirmed in multiple planes. Appropriate spread of contrast  into desired area was observed. No evidence of afferent or efferent intravascular uptake. Permanent images saved into the patient's record.   Description of Procedure #2:  Description of the Procedure: Skin & deeper tissues infiltrated with local anesthetic. Appropriate amount of time allowed to pass for local anesthetics to take effect. The procedure needles were then advanced to the target area. Proper needle placement secured. Negative aspiration confirmed. Solution injected in intermittent fashion, asking for systemic symptoms every 0.5cc of injectate. The needles were then removed and the area cleansed, making sure to leave some of the prepping solution back to take advantage of its long term bactericidal properties.  Vitals:   10/09/19 1010 10/09/19 1020 10/09/19 1030 10/09/19 1042  BP: 120/86 120/78 130/86 126/82  Resp: 19 17 16 16   Temp:  (!) 97.4 F (36.3 C)  98 F (36.7 C)  SpO2: 100% 100% 100% 99%  Weight:      Height:         End Time: 1010 hrs.  Materials:  Needle(s) Type: Spinal Needle Gauge: 22G Length: 5.0-in Medication(s): Please see orders for medications and dosing details.  Imaging Guidance (Non-Spinal):          Type of Imaging Technique: Fluoroscopy Guidance (Non-Spinal) Indication(s): Assistance in needle guidance and placement for procedures requiring needle placement in or near specific anatomical locations not easily accessible without such assistance. Exposure Time: Please see nurses notes. Contrast: Before injecting any contrast, we confirmed that the patient did not have an allergy to iodine, shellfish, or radiological contrast. Once satisfactory needle placement was completed at the desired level, radiological contrast was injected. Contrast injected under live fluoroscopy. No contrast complications. See chart for type and volume of contrast used. Fluoroscopic Guidance: I was personally present during the use of fluoroscopy. "Tunnel Vision Technique" used  to obtain the best possible view of the target area. Parallax error corrected before commencing the procedure. "Direction-depth-direction" technique used to introduce the needle under continuous pulsed fluoroscopy. Once target was reached, antero-posterior, oblique, and lateral fluoroscopic projection used confirm needle placement in all planes. Images permanently stored in EMR. Interpretation: I personally interpreted the imaging intraoperatively. Adequate needle placement confirmed in multiple planes. Appropriate spread of contrast into desired area was observed. No evidence of afferent or efferent intravascular uptake. Permanent images saved into the patient's record.  Antibiotic Prophylaxis:   Anti-infectives (From admission, onward)   None     Indication(s): None identified  Post-operative Assessment:  Post-procedure Vital Signs:  Pulse/HCG Rate:  98 Temp: 98 F (36.7 C) Resp: 16 BP: 126/82 SpO2: 99 %  EBL: None  Complications: No immediate post-treatment complications observed by team, or reported by patient.  Note: The patient  tolerated the entire procedure well. A repeat set of vitals were taken after the procedure and the patient was kept under observation following institutional policy, for this type of procedure. Post-procedural neurological assessment was performed, showing return to baseline, prior to discharge. The patient was provided with post-procedure discharge instructions, including a section on how to identify potential problems. Should any problems arise concerning this procedure, the patient was given instructions to immediately contact us, at any time, without hesitation. In any case, we plan to contact the patient by telephone for a follow-up status report regarding this interventional procedure.  Comments:  No additional relevant information.  Plan of Care  Orders:  Orders Placed This Encounter  Procedures   HIP INJECTION    Scheduling Instructions:      Side: Right-sided     Sedation: With Sedation.     Timeframe: Today   HIP INJECTION    Purpose: Diagnostic Indication: Hip pain 2ry to Trochanteric Burlitis right (M70.61).    Scheduling Instructions:     Procedure: Trochanteric bursa injection     Laterality: Right-sided     Sedation: Patient's choice.     Timeframe: Today   DG HIP UNILAT W OR W/O PELVIS 2-3 VIEWS RIGHT    Standing Status:   Future    Standing Expiration Date:   10/08/2020    Scheduling Instructions:     Please describe any evidence of DJD, such as joint narrowing, asymmetry, cysts, or any anomalies in bone density, production, or erosion.    Order Specific Question:   Reason for Exam (SYMPTOM  OR DIAGNOSIS REQUIRED)    Answer:   Right hip pain/arthralgia    Order Specific Question:   Preferred imaging location?    Answer:   [facility]    Order Specific Question:   Call Results- Best Contact Number?    Answer:   [phone] (Pain Clinic facility) (Dr. Laban Emperor)   DG PAIN CLINIC C-ARM 1-60 MIN NO REPORT    Intraoperative interpretation by procedural physician at [facility].    Standing Status:   Standing    Number of Occurrences:   1    Order Specific Question:   Reason for exam:    Answer:   Assistance in needle guidance and placement for procedures requiring needle placement in or near specific anatomical locations not easily accessible without such assistance.   DG HIP UNILAT W OR W/O PELVIS 2-3 VIEWS LEFT    Standing Status:   Future    Standing Expiration Date:   10/08/2020    Scheduling Instructions:     Please describe any evidence of DJD, such as joint narrowing, asymmetry, cysts, or any anomalies in bone density, production, or erosion.    Order Specific Question:   Reason for Exam (SYMPTOM  OR DIAGNOSIS REQUIRED)    Answer:   Left hip pain/arthralgia    Order Specific Question:   Preferred imaging location?    Answer:   [facility]    Order Specific Question:   Call  Results- Best Contact Number?    Answer:   [phone] (Pain Clinic facility) (Dr. Laban Emperor)   Informed Consent Details: Physician/Practitioner Attestation; Transcribe to consent form and obtain patient signature    Nursing Order: Transcribe to consent form and obtain patient signature. Note: Always confirm laterality of pain with Ms. Manson Passey, before procedure. Procedure: Hip injection Indication/Reason: Hip Joint Pain (Arthralgia) Provider Attestation: I, Sakira Dahmer A. Laban Emperor, MD, (Pain Management Specialist), the physician/practitioner, attest that I have  discussed with the patient the benefits, risks, side effects, alternatives, likelihood of achieving goals and potential problems during recovery for the procedure that I have provided informed consent.   Provide equipment / supplies at bedside    Equipment required: Single use, disposable, "Block Tray"    Standing Status:   Standing    Number of Occurrences:   1    Order Specific Question:   Specify    Answer:   Block Tray   Miscellanous precautions    Standing Status:   Standing    Number of Occurrences:   1   Chronic Opioid Analgesic:  Oxycodone IR 10 mg 1 tab PO q8 hrs (30 mg/day of oxycodone) MME/day:45 MME/day.   Medications ordered for procedure: Meds ordered this encounter  Medications   lidocaine (XYLOCAINE) 2 % (with pres) injection 400 mg   lactated ringers infusion 1,000 mL   midazolam (VERSED) 5 MG/5ML injection 1-2 mg    Make sure Flumazenil is available in the pyxis when using this medication. If oversedation occurs, administer 0.2 mg IV over 15 sec. If after 45 sec no response, administer 0.2 mg again over 1 min; may repeat at 1 min intervals; not to exceed 4 doses (1 mg)   diphenhydrAMINE (BENADRYL) injection 25 mg   methylPREDNISolone acetate (DEPO-MEDROL) injection 80 mg   ropivacaine (PF) 2 mg/mL (0.2%) (NAROPIN) injection 9 mL   Oxycodone HCl 10 MG TABS    Sig: Take 1 tablet (10 mg total) by mouth  every 8 (eight) hours as needed. Must last 30 days.    Dispense:  90 tablet    Refill:  0    Chronic Pain: STOP Act (Not applicable) Fill 1 day early if closed on refill date. Do not fill until: 01/01/2020. To last until: 01/31/2020. Avoid benzodiazepines within 8 hours of opioids   Medications administered: We administered lidocaine, lactated ringers, midazolam, diphenhydrAMINE, methylPREDNISolone acetate, and ropivacaine (PF) 2 mg/mL (0.2%).  See the medical record for exact dosing, route, and time of administration.  Follow-up plan:   Return in about 2 weeks (around 10/23/2019) for (VV), (PP).       Considering: NOTE: CONTRAST & FENTANYL Allergy. Diagnostic left L4 TFESI #1 Diagnostic/therapeutic right L5-S1 IA synovial cyst facet joint injection #1  Diagnostic bilateral L5 TFESI #1  Possible bilateral occipital nerve RFA Possible bilateral occipital nerve peripheral nerves stimulator trial Diagnostic bilateral cervical facet block Possible bilateral cervical facet RFA Diagnostic right CESI Diagnostic bilateral suprascapular NB Possible bilateral suprascapular nerve RFA Diagnostic right LESI Diagnostic left IA hip joint injection Diagnostic bilateral femoral nerve + obturator NB Possible bilateral femoral nerve + obturator nerve RFA Diagnostic bilateral genicular NB Possible bilateral genicular nerve RFA   Palliative PRN treatment(s): Diagnostic midline caudal ESI #2 (100/90/40/<50)  Diagnostic right IA hip joint injection #2  Diagnostic right trochanteric bursa injection #2  Diagnostic bilateral IA shoulder joint injection #2 (100/100/50/75)  Diagnostic right L5 TFESI #2 (100/100/97/>50)  Diagnostic left L4-5 LESI #2 (100/100/97/>50)  Palliative bilateral lumbar facet blocks #4 (100/100/90/50)  Palliative right lumbar facet RFA #2 (last done 01/16/2019) Palliative left lumbar facet RFA #2 (last done 01/30/2019) Diagnostic bilateral GONB#2(100/0/25/50)    Palliative bilateral IA knee joint injection (w/ steroid)  Palliative bilateral IA Hyalgan knee injections(series#2) (series#1completed on 07/04/2018)     Recent Visits Date Type Provider Dept  10/01/19 Office Visit Delano Metz, MD [facility]  09/23/19 Procedure visit Delano Metz, MD [facility]  09/03/19 Office Visit Delano Metz,  MD [facility]  Showing recent visits within past 90 days and meeting all other requirements   Today's Visits Date Type Provider Dept  10/09/19 Procedure visit Delano Metz, MD [facility]  Showing today's visits and meeting all other requirements   Future Appointments Date Type Provider Dept  10/29/19 Appointment Delano Metz, MD [facility]  Showing future appointments within next 90 days and meeting all other requirements   Disposition: Discharge home  Discharge Date & Time: 10/09/2019; 1044 hrs.   Primary Care Physician: Sherron Monday, MD Location: [facility] Note by: Oswaldo Done, MD Date: 10/09/2019; Time: 10:47 AM  Disclaimer:  Medicine is not an Visual merchandiser. The only guarantee in medicine is that nothing is guaranteed. It is important to note that the decision to proceed with this intervention was based on the information collected from the patient. The Data and conclusions were drawn from the patient's questionnaire, the interview, and the physical examination. Because the information was provided in large part by the patient, it cannot be guaranteed that it has not been purposely or unconsciously manipulated. Every effort has been made to obtain as much relevant data as possible for this evaluation. It is important to note that the conclusions that lead to this procedure are derived in large part from the available data. Always take into account that the treatment will also be dependent on availability of resources and  existing treatment guidelines, considered by other Pain Management Practitioners as being common knowledge and practice, at the time of the intervention. For Medico-Legal purposes, it is also important to point out that variation in procedural techniques and pharmacological choices are the acceptable norm. The indications, contraindications, technique, and results of the above procedure should only be interpreted and judged by a Board-Certified Interventional Pain Specialist with extensive familiarity and expertise in the same exact procedure and technique.

## 2019-10-09 NOTE — Progress Notes (Signed)
Safety precautions to be maintained throughout the outpatient stay will include: orient to surroundings, keep bed in low position, maintain call bell within reach at all times, provide assistance with transfer out of bed and ambulation.  

## 2019-10-10 ENCOUNTER — Telehealth: Payer: Self-pay

## 2019-10-10 NOTE — Telephone Encounter (Signed)
No answer. Unable to leave message.

## 2019-10-28 ENCOUNTER — Encounter: Payer: Self-pay | Admitting: Pain Medicine

## 2019-10-28 NOTE — Progress Notes (Addendum)
Pain relief after procedure (treated area only): (Questions asked to patient) 1. Starting about 15 minutes after the procedure, and "while the area was still numb" (from the local anesthetics), were you having any of your usual pain "in that area"? (NOT including the discomfort from the needle sticks.) First 1 hour:100% better. First 4-6 hours: 100% better. 2. Assuming that it did get numb. How long was the area numb? 100 % benefit, longer than 6 hours. How long? 24 hrs 3. How much better is your pain now, when compared to before the procedure? Current benefit: 40 % better. 4. Can you move better now? Improvement in ROM (Range of Motion): No. States she had a fall 5. Can you do more now? Improvement in function: No. 4. Did you have any problems with the procedure? Side-effects/Complications: No.

## 2019-10-29 ENCOUNTER — Ambulatory Visit: Payer: Medicare Other | Attending: Pain Medicine | Admitting: Pain Medicine

## 2019-10-29 ENCOUNTER — Other Ambulatory Visit: Payer: Self-pay

## 2019-10-29 DIAGNOSIS — M25551 Pain in right hip: Secondary | ICD-10-CM | POA: Diagnosis not present

## 2019-10-29 DIAGNOSIS — M7061 Trochanteric bursitis, right hip: Secondary | ICD-10-CM

## 2019-10-29 DIAGNOSIS — M1611 Unilateral primary osteoarthritis, right hip: Secondary | ICD-10-CM

## 2019-10-29 DIAGNOSIS — M792 Neuralgia and neuritis, unspecified: Secondary | ICD-10-CM

## 2019-10-29 DIAGNOSIS — M25552 Pain in left hip: Secondary | ICD-10-CM

## 2019-10-29 DIAGNOSIS — G8929 Other chronic pain: Secondary | ICD-10-CM

## 2019-10-29 MED ORDER — PREGABALIN 25 MG PO CAPS: ORAL_CAPSULE | ORAL | 0 refills | Status: DC

## 2019-10-29 NOTE — Progress Notes (Signed)
Pain Management Virtual Encounter Note - Virtual Visit via Telephone Telehealth (real-time audio visits between healthcare provider and patient).   Patient's Phone No. & Preferred Pharmacy:  [phone] (home); [phone] (mobile); (Preferred) [phone] No e-mail address on record  [facility] - [place] - [address] [place] [place] [address] Phone: [phone] Fax: [phone]    Pre-screening note:  Our staff contacted [name] and offered her an "in person", "face-to-face" appointment versus a telephone encounter. She indicated preferring the telephone encounter, at this time.   Reason for Virtual Visit: COVID-19*  Social distancing based on CDC and AMA recommendations.   I contacted Jean Gomez on 10/29/2019 via telephone.      I clearly identified myself as Oswaldo DoneFrancisco A Osker Ayoub, MD. I verified that I was speaking with the correct person using two identifiers (Name: Jean ReeksCatheryn F Andreen, and date of birth: 07/17/1956).  Advanced Informed Consent I sought verbal advanced consent from Jean Reeksatheryn F Tramel for virtual visit interactions. I informed [name] of possible security and privacy concerns, risks, and limitations associated with providing "not-in-person" medical evaluation and management services. I also informed [name] of the availability of "in-person" appointments. Finally, I informed her that there would be a charge for the virtual visit and that she could be  personally, fully or partially, financially responsible for it. [name] expressed understanding and agreed to proceed.   Historic Elements   Ms. Jean ReeksCatheryn F Hamrick is a 63 y.o. year old, female patient evaluated today after her last encounter by our practice on 10/10/2019. [name]  has a past medical history of Acute postoperative pain (01/16/2019), Allergy, Asthma, Cerebral aneurysm, Chest pain (11/01/2016), Chronic migraine, Chronic respiratory failure (HCC), COPD (chronic  obstructive pulmonary disease) (HCC), Depression, Hypertension, Osteoarthritis, Pneumonia, Sarcoid, and Seizures (HCC). She also  has a past surgical history that includes Cholecystectomy; Abdominal hysterectomy; Spinal fusion; Back surgery; Esophagogastroduodenoscopy (egd) with propofol (N/A, 08/19/2019); and Colonoscopy with propofol (N/A, 08/19/2019). [name] has a current medication list which includes the following prescription(s): albuterol, albuterol sulfate, alprazolam, amlodipine, aspirin ec, aspirin-calcium carbonate, atorvastatin, betamethasone (augmented), budesonide-formoterol, calcium carbonate, citalopram, clotrimazole-betamethasone, clotrimazole-betamethasone, dexilant, diclofenac sodium, diphenhydramine, doxycycline, medical compression stockings, hydrochlorothiazide, hydroxyzine, ipratropium, lactulose, levetiracetam, lidocaine, linaclotide, losartan, mirtazapine, nitroglycerin, olmesartan, ondansetron, oxycodone hcl, oxycodone hcl, oxycodone hcl, pantoprazole, potassium chloride sa, promethazine, quetiapine, sucralfate, tizanidine, torsemide, trazodone, venlafaxine, and pregabalin. She  reports that she has never smoked. She has never used smokeless tobacco. She reports current alcohol use. She reports that she does not use drugs. [name] is allergic to contrast media [iodinated diagnostic agents]; gabapentin; labetalol; sulfabenzamide; fentanyl; and penicillins.   HPI  Today, she is being contacted for a post-procedure assessment.  The patient indicates having attained some benefit from the injection, but she still having pain and is wondering where we are regarding the spinal cord stimulator trial.  She indicates having completed the MMPI-2.  I try to look for the results on this and I did find a note by Dr. Arley PhenixJohn Rodenbough, which basically stated that the patient would be doing the test, but I did not see any follow-up after that.  Today I will have my staff search for this and see what the  status is with regarding that spinal cord stimulator trial.  Since the patient continues to have some problems with her pain, I will go ahead and get her started on some Lyrica 25 mg p.o. at bedtime and will be titrating that up  as tolerated.  Today I took the time to explain to her how she needs to take the medication regularly and also that it will take up to 3 weeks for levels to build up in her system.  I also told her not to get discouraged if she initially does not feel any significant benefit since we are at a very low dose.  Post-Procedure Evaluation  Procedure: Diagnostic IA right hip injection #1 + diagnostic right trochanteric bursa injection #1 under fluoroscopic guidance and IV sedation Pre-procedure pain level:  8/10 Post-procedure: 0/10 (100% relief)  Sedation: Sedation provided.  Valerie Salts, RN  10/28/2019  2:12 PM  Addendum Pain relief after procedure (treated area only): (Questions asked to patient) 1. Starting about 15 minutes after the procedure, and "while the area was still numb" (from the local anesthetics), were you having any of your usual pain "in that area"? (NOT including the discomfort from the needle sticks.) First 1 hour:100% better. First 4-6 hours: 100% better. 2. Assuming that it did get numb. How long was the area numb? 100 % benefit, longer than 6 hours. How long? 24 hrs 3. How much better is your pain now, when compared to before the procedure? Current benefit: 40 % better. 4. Can you move better now? Improvement in ROM (Range of Motion): No. States she had a fall 5. Can you do more now? Improvement in function: No. 4. Did you have any problems with the procedure? Side-effects/Complications: No.   Current benefits: Defined as benefit that persist at this time.   Analgesia:  <50% better Function: Back to baseline ROM: Back to baseline   Pharmacotherapy Assessment  Analgesic: Oxycodone IR 10 mg 1 tab PO q8 hrs (30 mg/day of oxycodone) MME/day:45  MME/day.   Monitoring: Pharmacotherapy: No side-effects or adverse reactions reported. [place] PMP: PDMP reviewed during this encounter.       Compliance: No problems identified. Effectiveness: Clinically acceptable. Plan: Refer to "POC".  UDS:  Summary  Date Value Ref Range Status  03/27/2018 FINAL  Final    Comment:    ==================================================================== TOXASSURE SELECT 13 (MW) ==================================================================== Test                             Result       Flag       Units Drug Present and Declared for Prescription Verification   Hydrocodone                    646          EXPECTED   ng/mg creat   Hydromorphone                  367          EXPECTED   ng/mg creat   Dihydrocodeine                 317          EXPECTED   ng/mg creat   Norhydrocodone                 2063         EXPECTED   ng/mg creat    Sources of hydrocodone include scheduled prescription    medications. Hydromorphone, dihydrocodeine and norhydrocodone are    expected metabolites of hydrocodone. Hydromorphone and    dihydrocodeine are also available as scheduled prescription    medications. ==================================================================== Test  Result    Flag   Units      Ref Range   Creatinine              24               mg/dL      >=18 ==================================================================== Declared Medications:  The flagging and interpretation on this report are based on the  following declared medications.  Unexpected results may arise from  inaccuracies in the declared medications.  **Note: The testing scope of this panel includes these medications:  Hydrocodone (Hydrocodone-Acetaminophen)  **Note: The testing scope of this panel does not include following  reported medications:  Acetaminophen (Hydrocodone-Acetaminophen)  Albuterol  Amlodipine  Aspirin (Aspirin 81)  Atorvastatin   Budenoside (Symbicort)  Clotrimazole (Lotrisone)  Diclofenac  Escitalopram  Formoterol (Symbicort)  Hydrochlorothiazide (HCTZ)  Ipratropium  Ketoconazole  Lactulose  Levetiracetam  Losartan (Losartan Potassium)  Lubiprostone  Mirtazapine  Nitroglycerin  Ondansetron  Pantoprazole  Potassium  Sucralfate  Tizanidine  Topical  Torsemide  Venlafaxine  Vitamin D2 (Ergocalciferol) ==================================================================== For clinical consultation, please call [phone]. ====================================================================    Laboratory Chemistry Profile (12 mo)  Renal: 10/08/2019: BUN 19; Creatinine, Ser 0.81  Lab Results  Component Value Date   GFRAA >60 10/08/2019   GFRNONAA >60 10/08/2019   Hepatic: 10/08/2019: Albumin 4.4 Lab Results  Component Value Date   AST 20 10/08/2019   ALT 21 10/08/2019   Other: No results found for requested labs within last 8760 hours. Note: Above Lab results reviewed.  Imaging  Last 90 days:  Dg Pain Clinic C-arm 1-60 Min No Report  Result Date: 10/09/2019 Fluoro was used, but no Radiologist interpretation will be provided. Please refer to "NOTES" tab for provider progress note.  Dg Pain Clinic C-arm 1-60 Min No Report  Result Date: 09/23/2019 Fluoro was used, but no Radiologist interpretation will be provided. Please refer to "NOTES" tab for provider progress note.  Ct Renal Stone Study  Result Date: 10/08/2019 CLINICAL DATA:  Lower abdominal pain, dysuria, hematuria for several days. EXAM: CT ABDOMEN AND PELVIS WITHOUT CONTRAST TECHNIQUE: Multidetector CT imaging of the abdomen and pelvis was performed following the standard protocol without IV contrast. COMPARISON:  07/14/2019 FINDINGS: Lower chest: No acute findings. Hepatobiliary: No mass visualized on this unenhanced exam. Prior cholecystectomy. No evidence of biliary obstruction. Pancreas: No mass or inflammatory process  visualized on this unenhanced exam. Spleen:  Within normal limits in size. Adrenals/Urinary tract: No evidence of urolithiasis or hydronephrosis. Unremarkable unopacified urinary bladder. Stomach/Bowel: No evidence of obstruction, inflammatory process, or abnormal fluid collections. Normal appendix visualized. Vascular/Lymphatic: No pathologically enlarged lymph nodes identified. No evidence of abdominal aortic aneurysm. Aortic atherosclerosis. Reproductive: Prior hysterectomy noted. Adnexal regions are unremarkable in appearance. Other:  None. Musculoskeletal:  No suspicious bone lesions identified. IMPRESSION: No evidence of urolithiasis, hydronephrosis, or other acute findings. Aortic Atherosclerosis (ICD10-I70.0). Electronically Signed   By: Danae Orleans M.D.   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 Diagnoses of Osteoarthritis of hip (Right), Greater trochanteric bursitis (Right), and Neurogenic pain were also pertinent to this visit.  Plan of Care  I have discontinued Breaunna F. Stell's busPIRone and oxyCODONE-acetaminophen. I am also having her start on pregabalin. Additionally, I am having her maintain her Albuterol Sulfate, betamethasone (augmented), ipratropium, nitroGLYCERIN, aspirin EC, amLODipine, hydrochlorothiazide, losartan, pantoprazole, sucralfate, potassium chloride SA, clotrimazole-betamethasone, venlafaxine, levETIRAcetam, atorvastatin, linaclotide, citalopram, ondansetron, diphenhydrAMINE,  torsemide, Medical Compression Stockings, QUEtiapine, ALPRAZolam, doxycycline, mirtazapine, olmesartan, albuterol, Aspirin-Calcium Carbonate, budesonide-formoterol, calcium carbonate, clotrimazole-betamethasone, lactulose, traZODone, Dexilant, hydrOXYzine, promethazine, Oxycodone HCl, Oxycodone HCl, tiZANidine, lidocaine, diclofenac sodium, and Oxycodone HCl.  Pharmacotherapy (Medications Ordered): Meds  ordered this encounter  Medications  . pregabalin (LYRICA) 25 MG capsule    Sig: Take 1 capsule (25 mg total) by mouth at bedtime for 15 days, THEN 1 capsule (25 mg total) 2 (two) times daily for 15 days, THEN 1 capsule (25 mg total) 3 (three) times daily for 15 days.    Dispense:  90 capsule    Refill:  0    Fill one day early if pharmacy is closed on scheduled refill date. May substitute for generic if available.   Orders:  Orders Placed This Encounter  Procedures  . DG Hip (Right)    Standing Status:   Future    Standing Expiration Date:   10/28/2020    Scheduling Instructions:     Please describe any evidence of DJD, such as joint narrowing, asymmetry, cysts, or any anomalies in bone density, production, or erosion.    Order Specific Question:   Reason for Exam (SYMPTOM  OR DIAGNOSIS REQUIRED)    Answer:   Right hip pain/arthralgia    Order Specific Question:   Preferred imaging location?    Answer:   [facility]    Order Specific Question:   Call Results- Best Contact Number?    Answer:   [phone] (Pain Clinic facility) (Dr. Dossie Arbour)  . DG Hip (Left)    Standing Status:   Future    Standing Expiration Date:   10/28/2020    Scheduling Instructions:     Please describe any evidence of DJD, such as joint narrowing, asymmetry, cysts, or any anomalies in bone density, production, or erosion.    Order Specific Question:   Reason for Exam (SYMPTOM  OR DIAGNOSIS REQUIRED)    Answer:   Right hip pain/arthralgia    Order Specific Question:   Preferred imaging location?    Answer:   [facility]    Order Specific Question:   Call Results- Best Contact Number?    Answer:   [phone] (Pain Clinic facility) (Dr. Dossie Arbour)   Follow-up plan:   Return in about 1 month (around 11/28/2019) for (VV), (MM) to evaluate Lyrica trial. Pending the medpsyc eval for possible SCS trial.      Interventional treatment options:  Under consideration: NOTE: [place]  Allergy. Diagnostic left L4 TFESI #1 Diagnostic/therapeutic right L5-S1 IA synovial cyst facet joint injection #1  Diagnostic bilateral L5 TFESI #1  Possible bilateral occipital nerve RFA Possible bilateral occipital nerve peripheral nerves stimulator trial Diagnostic bilateral cervical facet block Possible bilateral cervical facet RFA Diagnostic right CESI Diagnostic bilateral suprascapular NB Possible bilateral suprascapular nerve RFA Diagnostic right LESI Diagnostic left IA hip joint injection Diagnostic bilateral femoral + obturator NB Possible bilateral femoral + obturator nerve RFA Diagnostic bilateral genicular NB Possible bilateral genicular RFA   Therapeutic/palliative (PRN): Diagnostic midline caudal ESI #2 (100/90/40/<50)  Diagnostic right IA hip joint injection #2  Diagnostic right trochanteric bursa injection #2  Diagnostic bilateral IA shoulder joint injection #2 (100/100/50/75)  Diagnostic right L5 TFESI #2 (100/100/97/>50)  Diagnostic left L4-5 LESI #2 (100/100/97/>50)  Palliative bilateral lumbar facet blocks #4 (100/100/90/50)  Palliative right lumbar facet RFA #2 (last done 01/16/2019) Palliative left lumbar facet RFA #2 (last done 01/30/2019) Diagnostic bilateral GONB#2(100/0/25/50)  Palliative bilateral IA knee joint injection (w/ steroid)  Palliative bilateral IA Hyalgan knee injections(series#2) (series#1completed on 07/04/2018)     Recent Visits Date Type Provider Dept  10/09/19 Procedure visit Delano Metz, MD [facility]  10/01/19 Office Visit Delano Metz, MD [facility]  09/23/19 Procedure visit Delano Metz, MD [facility]  09/03/19 Office Visit Delano Metz, MD [facility]  Showing recent visits within past 90 days and meeting all other requirements   Today's Visits Date Type Provider Dept  10/29/19 Telemedicine Delano Metz, MD [facility]   Showing today's visits and meeting all other requirements   Future Appointments Date Type Provider Dept  01/26/20 Appointment Delano Metz, MD [facility]  Showing future appointments within next 90 days and meeting all other requirements   I discussed the assessment and treatment plan with the patient. The patient was provided an opportunity to ask questions and all were answered. The patient agreed with the plan and demonstrated an understanding of the instructions.  Patient advised to call back or seek an in-person evaluation if the symptoms or condition worsens.  Total duration of non-face-to-face encounter: 22 minutes.  Note by: Oswaldo Done, MD Date: 10/29/2019; Time: 3:43 PM  Note: This dictation was prepared with Dragon dictation. Any transcriptional errors that may result from this process are unintentional.  Disclaimer:  * Given the special circumstances of the COVID-19 pandemic, the federal government has announced that the Office for Civil Rights (OCR) will exercise its enforcement discretion and will not impose penalties on physicians using telehealth in the event of noncompliance with regulatory requirements under the [employer] Portability and Accountability Act (HIPAA) in connection with the good faith provision of telehealth during the COVID-19 national public health emergency. (AMA)

## 2019-11-22 ENCOUNTER — Emergency Department: Payer: Medicare Other

## 2019-11-22 ENCOUNTER — Emergency Department
Admission: EM | Admit: 2019-11-22 | Discharge: 2019-11-22 | Disposition: A | Discharge status: Home / Self Care | Payer: Medicare Other | Attending: Student | Admitting: Student

## 2019-11-22 ENCOUNTER — Encounter: Payer: Self-pay | Admitting: Emergency Medicine

## 2019-11-22 ENCOUNTER — Other Ambulatory Visit: Payer: Self-pay

## 2019-11-22 DIAGNOSIS — M79661 Pain in right lower leg: Secondary | ICD-10-CM | POA: Diagnosis not present

## 2019-11-22 DIAGNOSIS — E860 Dehydration: Secondary | ICD-10-CM | POA: Insufficient documentation

## 2019-11-22 DIAGNOSIS — S82841A Displaced bimalleolar fracture of right lower leg, initial encounter for closed fracture: Secondary | ICD-10-CM | POA: Diagnosis not present

## 2019-11-22 DIAGNOSIS — Y92009 Unspecified place in unspecified non-institutional (private) residence as the place of occurrence of the external cause: Secondary | ICD-10-CM | POA: Insufficient documentation

## 2019-11-22 DIAGNOSIS — W06XXXA Fall from bed, initial encounter: Secondary | ICD-10-CM | POA: Insufficient documentation

## 2019-11-22 DIAGNOSIS — J45909 Unspecified asthma, uncomplicated: Secondary | ICD-10-CM | POA: Insufficient documentation

## 2019-11-22 DIAGNOSIS — S99911A Unspecified injury of right ankle, initial encounter: Secondary | ICD-10-CM | POA: Diagnosis present

## 2019-11-22 DIAGNOSIS — R52 Pain, unspecified: Secondary | ICD-10-CM

## 2019-11-22 DIAGNOSIS — N179 Acute kidney failure, unspecified: Secondary | ICD-10-CM | POA: Insufficient documentation

## 2019-11-22 DIAGNOSIS — I10 Essential (primary) hypertension: Secondary | ICD-10-CM | POA: Diagnosis not present

## 2019-11-22 DIAGNOSIS — Z79899 Other long term (current) drug therapy: Secondary | ICD-10-CM | POA: Diagnosis not present

## 2019-11-22 DIAGNOSIS — Y999 Unspecified external cause status: Secondary | ICD-10-CM | POA: Insufficient documentation

## 2019-11-22 DIAGNOSIS — Y9389 Activity, other specified: Secondary | ICD-10-CM | POA: Diagnosis not present

## 2019-11-22 DIAGNOSIS — W19XXXA Unspecified fall, initial encounter: Secondary | ICD-10-CM

## 2019-11-22 DIAGNOSIS — S82891A Other fracture of right lower leg, initial encounter for closed fracture: Secondary | ICD-10-CM

## 2019-11-22 LAB — BASIC METABOLIC PANEL
Anion gap: 11 (ref 5–15)
BUN: 24 mg/dL — ABNORMAL HIGH (ref 8–23)
CO2: 24 mmol/L (ref 22–32)
Calcium: 9.6 mg/dL (ref 8.9–10.3)
Chloride: 102 mmol/L (ref 98–111)
Creatinine, Ser: 1.46 mg/dL — ABNORMAL HIGH (ref 0.44–1.00)
GFR calc Af Amer: 44 mL/min — ABNORMAL LOW (ref >60)
GFR calc non Af Amer: 38 mL/min — ABNORMAL LOW (ref >60)
Glucose, Bld: 115 mg/dL — ABNORMAL HIGH (ref 70–99)
Potassium: 3.7 mmol/L (ref 3.5–5.1)
Sodium: 137 mmol/L (ref 135–145)

## 2019-11-22 LAB — URINALYSIS, COMPLETE (UACMP) WITH MICROSCOPIC
Bacteria, UA: NONE SEEN
Bilirubin Urine: NEGATIVE
Glucose, UA: 150 mg/dL — AB
Hgb urine dipstick: NEGATIVE
Ketones, ur: NEGATIVE mg/dL
Leukocytes,Ua: NEGATIVE
Nitrite: NEGATIVE
Protein, ur: NEGATIVE mg/dL
Specific Gravity, Urine: 1.012 (ref 1.005–1.030)
pH: 5 (ref 5.0–8.0)

## 2019-11-22 LAB — CBC
HCT: 33.5 % — ABNORMAL LOW (ref 36.0–46.0)
Hemoglobin: 9.7 g/dL — ABNORMAL LOW (ref 12.0–15.0)
MCH: 26.4 pg (ref 26.0–34.0)
MCHC: 29 g/dL — ABNORMAL LOW (ref 30.0–36.0)
MCV: 91.3 fL (ref 80.0–100.0)
Platelets: 377 10*3/uL (ref 150–400)
RBC: 3.67 MIL/uL — ABNORMAL LOW (ref 3.87–5.11)
RDW: 13.5 % (ref 11.5–15.5)
WBC: 9 10*3/uL (ref 4.0–10.5)
nRBC: 0 % (ref 0.0–0.2)

## 2019-11-22 LAB — TROPONIN I (HIGH SENSITIVITY)
Troponin I (High Sensitivity): <2 ng/L (ref <18)
Troponin I (High Sensitivity): 3 ng/L (ref <18)

## 2019-11-22 MED ORDER — MORPHINE SULFATE (PF) 2 MG/ML IV SOLN
2.0000 mg | Freq: Once | INTRAVENOUS | Status: AC
  Administered 2019-11-22: 2 mg via INTRAVENOUS
  Filled 2019-11-22: qty 1

## 2019-11-22 MED ORDER — OXYCODONE-ACETAMINOPHEN 5-325 MG PO TABS
1.0000 | ORAL_TABLET | ORAL | Status: AC
  Administered 2019-11-22: 1 via ORAL
  Filled 2019-11-22: qty 1

## 2019-11-22 MED ORDER — SODIUM CHLORIDE 0.9% FLUSH: 3.0000 mL | Freq: Once | INTRAVENOUS | Status: DC

## 2019-11-22 MED ORDER — SODIUM CHLORIDE 0.9 % IV BOLUS: 1000.0000 mL | Freq: Once | INTRAVENOUS | Status: DC

## 2019-11-22 MED ORDER — SODIUM CHLORIDE 0.9 % IV BOLUS
1000.0000 mL | Freq: Once | INTRAVENOUS | Status: AC
  Administered 2019-11-22: 1000 mL via INTRAVENOUS

## 2019-11-22 NOTE — ED Notes (Signed)
ED Provider at bedside. 

## 2019-11-22 NOTE — ED Notes (Signed)
Pt assisted with bedpan to urinate 

## 2019-11-22 NOTE — ED Notes (Signed)
Pt states that she has a walker and cane at home and has previous experience with devices. Pt reports that she does not anticipate any difficulties with mobility at home and that her granddaughter will be transporting her home if discharged.

## 2019-11-22 NOTE — ED Notes (Signed)
Pt given bedpan to urinate. Assistance provided. Pt repositioned in bed.

## 2019-11-22 NOTE — ED Triage Notes (Signed)
During triage BP noted as charted. Verified x 3. patient agrees she has felt weak.

## 2019-11-22 NOTE — ED Notes (Signed)
Pt confirms ride home (granddautgther).

## 2019-11-22 NOTE — ED Triage Notes (Signed)
States feel when getting out of bed at 0230 this am. R ankle pain. States foot was asleep and did not hold her when she got up.

## 2019-11-22 NOTE — ED Notes (Signed)
Pt st she has a walker, cane at home, pt lives with son and granddaughter lives near by. EDP at bedside at this time.

## 2019-11-22 NOTE — ED Notes (Signed)
Pt eating zaxbys that daughter dropped off for pt

## 2019-11-22 NOTE — ED Provider Notes (Signed)
[facility] Emergency Department Provider Note  ____________________________________________   First MD Initiated Contact with Patient 11/22/19 1138     (approximate)  I have reviewed the triage vital signs and the nursing notes.  History  Chief Complaint Ankle Pain, Weakness, and Hypotension    HPI Jean Gomez is a 63 y.o. female who presents for R ankle pain after a fall. Patient states she got up in the middle of the night to use the restroom. Her foot was asleep, causing her to fall. She has resultant swelling and pain to the R ankle and foot. No head injury or LOC. No other related injuries. Pain is sharp, located to the ankle, no radiation, 10/10 in severity. No alleviating factors. Worsened w/ ambulation. She denies any preceding presyncopal symptoms (no chest pain, palpitations, SOB, dizziness) prior to her fall, but was noted to be hypotensive in triage. She does admit to significantly decreased PO intake over the last month, as a result of loss of appetite in the setting of her mother's recent death. She does report recently completing treatment for UTI, but is concerned she may still have "lingering infection" due to some malodorous urine and burning. She denies sick contacts. On her baseline 4 L [place] for hx of sarcoid. She reports some ongoing swelling of her R calf over the last several weeks for which she uses compression stockings. No hx of VTE, no immobilization.     Past Medical Hx Past Medical History:  Diagnosis Date  . Acute postoperative pain 01/16/2019  . Allergy   . Asthma   . Cerebral aneurysm   . Chest pain 11/01/2016  . Chronic migraine   . Chronic respiratory failure (HCC)   . COPD (chronic obstructive pulmonary disease) (HCC)   . Depression    major depression in remission  . Hypertension   . Osteoarthritis   . Pneumonia    12/2018  . Sarcoid   . Seizures [facility])     Problem List Patient Active Problem List   Diagnosis  Date Noted  . Neurogenic pain 10/29/2019  . Osteoarthritis of hip (Right) 10/09/2019  . Greater trochanteric bursitis (Right) 10/09/2019  . Lumbosacral spinal stenosis 10/09/2019  . Lumbosacral (intervertebral disc displacement) (IVDD) 10/09/2019  . Lumbar nerve root impingement (Left: L4) (Right: L5) 10/09/2019  . Grade 1 Anterolisthesis of L5/S1 10/09/2019  . Lumbar facet arthropathy (L3-4, L4-5, L5-S1) 10/09/2019  . Lumbosacral facet joint synovial cyst (Right: L5-S1) 10/09/2019  . Lumbar foraminal stenosis (Bilateral: L2-S1) 07/10/2019  . Abnormal MRI, lumbar spine 07/03/2019  . Disturbance of skin sensation 06/16/2019  . Chronic low back pain (Secondary Area of Pain) (Bilateral) (R>L) w/ sciatica 01/16/2019  . Acute on chronic respiratory failure with hypoxia (HCC) 12/18/2018  . Cervicogenic headache 10/21/2018  . Lumbar spondylosis 06/25/2018  . History of allergy to radiographic contrast media 05/30/2018  . Bipolar affective disorder, current episode manic (HCC) 04/17/2018  . Chronic hip pain [facility] Area of Pain) (Bilateral) (R>L) 02/26/2018  . DDD (degenerative disc disease), cervical 02/26/2018  . Cervical facet arthropathy (Bilateral) 02/26/2018  . Cervical facet syndrome (Bilateral) (R>L) 02/26/2018  . Cervical Grade 1 Anterolisthesis of C4 over C5 (Degenerative) 02/26/2018  . Cervical Grade 1 Retrolisthesis of C5 over C6 02/26/2018  . Cervical foraminal stenosis (C3-4, C4-5, and C5-6) (Bilateral) 02/26/2018  . DDD (degenerative disc disease), lumbosacral 02/26/2018  . Osteoarthritis of lumbar spine 02/26/2018  . Osteoarthritis of facet joint of lumbar spine 02/26/2018  . Spondylosis without  myelopathy or radiculopathy, cervicothoracic region 02/26/2018  . Occipital headache (Right) 02/25/2018  . Frontal headache (Left) 02/25/2018  . Chronic low back pain (Midline) (Secondary Area of Pain) 02/25/2018  . Chronic lumbar radiculopathy (Right) 02/25/2018  . Osteoarthritis  involving multiple joints 02/25/2018  . Osteoarthritis of shoulder (Bilateral) 02/25/2018  . Chronic musculoskeletal pain 02/25/2018  . Lumbar facet syndrome (Bilateral) (R>L) 02/25/2018  . Vitamin D deficiency 02/14/2018  . Chronic pain syndrome 02/07/2018  . Pharmacologic therapy 02/07/2018  . Disorder of skeletal system 02/07/2018  . Problems influencing health status 02/07/2018  . Long term current use of opiate analgesic 02/07/2018  . Chronic low back pain (Secondary Area of Pain) (Bilateral) (R>L) w/ sciatica (Right) 02/07/2018  . Chronic lower extremity pain [facility] Area of Pain) (Bilateral) (R>L) 02/07/2018  . Chronic knee pain (Fourth Area of Pain) (Bilateral) (R>L) 02/07/2018  . Chronic shoulder pain (Fifth Area of Pain) (Bilateral) (R>L) 02/07/2018  . Occipital neuralgia (Primary Area of Pain) (Bilateral) (R>L) 02/07/2018  . Chronic neck pain (Bilateral) (R>L) 02/07/2018  . Chronic sacroiliac joint pain (Right) 02/07/2018  . H/O aneurysm 01/30/2018  . History of depression 01/30/2018  . History of seizure disorder 01/30/2018  . Chronic headache disorder (Primary Area of Pain) 01/30/2018  . Chronic migraine without aura 01/17/2018  . Chronic respiratory failure with hypoxia (HCC) 01/17/2018  . Constipation 01/17/2018  . HTN, goal below 140/90 01/17/2018  . Hypercholesterolemia 01/17/2018  . Major depression in remission (HCC) 01/17/2018  . COPD (chronic obstructive pulmonary disease) (HCC) 01/17/2018  . Sarcoidosis 01/17/2018  . Seizure disorder (HCC) 01/17/2018  . Nonruptured cerebral aneurysm 10/22/2017  . Obstructive sleep apnea on CPAP 09/11/2017  . Generalized anxiety disorder 01/26/2017  . Irritable bowel syndrome 01/26/2017  . Pulmonary emphysema (HCC) 01/26/2017  . GERD (gastroesophageal reflux disease) 08/02/2016  . Hiatal hernia 08/02/2016  . Essential tremor 07/19/2016  . Muscle spasm 10/05/2015  . Spondylosis without myelopathy or radiculopathy,  lumbosacral region 10/05/2015  . Rotator cuff injury 09/17/2015  . Chronic shoulder pain (Right) 09/17/2015  . Epilepsy (HCC) 07/14/2015  . Insomnia 07/14/2015  . Osteoarthritis of knee (Bilateral) (R>L) 06/24/2015    Past Surgical Hx Past Surgical History:  Procedure Laterality Date  . ABDOMINAL HYSTERECTOMY    . BACK SURGERY     spine fusion anterior lumbar with open posterior  . CHOLECYSTECTOMY    . COLONOSCOPY WITH PROPOFOL N/A 08/19/2019   Procedure: COLONOSCOPY WITH PROPOFOL;  Surgeon: Christena Deem, MD;  Location: [facility] ENDOSCOPY;  Service: Endoscopy;  Laterality: N/A;  . ESOPHAGOGASTRODUODENOSCOPY (EGD) WITH PROPOFOL N/A 08/19/2019   Procedure: ESOPHAGOGASTRODUODENOSCOPY (EGD) WITH PROPOFOL;  Surgeon: Christena Deem, MD;  Location: [facility] ENDOSCOPY;  Service: Endoscopy;  Laterality: N/A;  . SPINAL FUSION      Medications Prior to Admission medications   Medication Sig Start Date End Date Taking? Authorizing Provider  albuterol (PROVENTIL) (2.5 MG/3ML) 0.083% nebulizer solution Take 2.5 mg by nebulization every 6 (six) hours as needed for wheezing or shortness of breath.    [provider]  Albuterol Sulfate 108 (90 Base) MCG/ACT AEPB Inhale 2 puffs into the lungs every 6 (six) hours as needed.    [provider]  ALPRAZolam Prudy Feeler) 0.25 MG tablet  04/16/19   [provider]  amLODipine (NORVASC) 10 MG tablet Take 1 tablet (10 mg total) by mouth daily. 04/19/18   Clapacs, Jackquline Denmark, MD  aspirin EC 81 MG tablet Take 1 tablet (81 mg total) by mouth daily. 04/19/18  Clapacs, Jackquline DenmarkJohn T, MD  Aspirin-Calcium Carbonate [phone] MG TABS Take by mouth.    [provider]  atorvastatin (LIPITOR) 20 MG tablet Take 20 mg by mouth daily.    [provider]  betamethasone, augmented, (DIPROLENE) 0.05 % lotion Apply topically 2 (two) times daily.    [provider]  budesonide-formoterol (SYMBICORT) 160-4.5 MCG/ACT inhaler Inhale into the lungs.     [provider]  calcium carbonate (OS-CAL) 1250 (500 Ca) MG chewable tablet Chew by mouth daily.    [provider]  citalopram (CELEXA) 20 MG tablet Take 20 mg by mouth daily.    [provider]  clotrimazole-betamethasone (LOTRISONE) cream Apply 1 application topically 2 (two) times daily. 04/19/18   Clapacs, Jackquline DenmarkJohn T, MD  clotrimazole-betamethasone (LOTRISONE) cream Apply 1 application topically 2 (two) times daily.    [provider]  dexlansoprazole (DEXILANT) 60 MG capsule Take 60 mg by mouth daily.    [provider]  diclofenac sodium (VOLTAREN) 1 % GEL Apply 4 g topically 4 (four) times daily. 10/01/19 03/29/20  Delano MetzNaveira, Francisco, MD  diphenhydrAMINE (BENADRYL) 25 MG tablet Take 25 mg by mouth every 6 (six) hours as needed.    [provider]  doxycycline (PERIOSTAT) 20 MG tablet  05/06/19   [provider]  Elastic Bandages & Supports (MEDICAL COMPRESSION STOCKINGS) MISC Please provide 15mmHg compression stockings 12/30/18   Minna AntisPaduchowski, Kevin, MD  hydrochlorothiazide (HYDRODIURIL) 25 MG tablet Take 1 tablet (25 mg total) by mouth daily. 04/19/18   Clapacs, Jackquline DenmarkJohn T, MD  hydrOXYzine (ATARAX/VISTARIL) 10 MG tablet Take 10 mg by mouth.    [provider]  ipratropium (ATROVENT) 0.02 % nebulizer solution Take 0.5 mg by nebulization every 4 (four) hours as needed for wheezing or shortness of breath.    [provider]  lactulose (CHRONULAC) 10 GM/15ML solution Take 10 g by mouth 3 (three) times daily.    [provider]  levETIRAcetam (KEPPRA) 500 MG tablet Take 500 mg by mouth 2 (two) times daily. 11/20/18   [provider]  lidocaine (XYLOCAINE) 5 % ointment Apply 1 application topically 4 (four) times daily as needed for moderate pain. 1 application = 6 in. of ointment = 5 g (Max: 20 g/day) 10/01/19 03/29/20  Delano MetzNaveira, Francisco, MD  linaclotide [facility](LINZESS) 145 MCG CAPS capsule Take 145 mcg by mouth daily  before breakfast.    [provider]  losartan (COZAAR) 25 MG tablet Take 1 tablet (25 mg total) by mouth daily. 04/19/18   Clapacs, Jackquline DenmarkJohn T, MD  mirtazapine (REMERON) 15 MG tablet  03/05/19   [provider]  nitroGLYCERIN (NITROSTAT) 0.4 MG SL tablet Place 0.4 mg under the tongue every 5 (five) minutes as needed for chest pain.    [provider]  olmesartan (BENICAR) 20 MG tablet  04/08/19   [provider]  ondansetron (ZOFRAN) 4 MG tablet Take 4 mg by mouth every 8 (eight) hours as needed for nausea or vomiting.    [provider]  Oxycodone HCl 10 MG TABS Take 1 tablet (10 mg total) by mouth every 8 (eight) hours as needed. Must last 30 days. 11/02/19 12/02/19  Delano MetzNaveira, Francisco, MD  Oxycodone HCl 10 MG TABS Take 1 tablet (10 mg total) by mouth every 8 (eight) hours as needed. Must last 30 days. 12/02/19 01/01/20  Delano MetzNaveira, Francisco, MD  Oxycodone HCl 10 MG TABS Take 1 tablet (10 mg total) by mouth every 8 (eight) hours as needed. Must last 30  days. 01/01/20 01/31/20  Delano Metz, MD  pantoprazole (PROTONIX) 40 MG tablet Take 1 tablet (40 mg total) by mouth daily. 04/19/18   Clapacs, Jackquline Denmark, MD  potassium chloride SA (K-DUR,KLOR-CON) 20 MEQ tablet Take 1 tablet (20 mEq total) by mouth 2 (two) times daily. 04/19/18   Clapacs, Jackquline Denmark, MD  pregabalin (LYRICA) 25 MG capsule Take 1 capsule (25 mg total) by mouth at bedtime for 15 days, THEN 1 capsule (25 mg total) 2 (two) times daily for 15 days, THEN 1 capsule (25 mg total) 3 (three) times daily for 15 days. 10/29/19 12/13/19  Delano Metz, MD  promethazine (PHENERGAN) 25 MG tablet Take 25 mg by mouth every 6 (six) hours as needed for nausea or vomiting.    [provider]  QUEtiapine (SEROQUEL) 400 MG tablet Take 400 mg by mouth at bedtime.    [provider]  sucralfate (CARAFATE) 1 g tablet Take 1 tablet (1 g total) by mouth 4 (four) times daily. 04/19/18   Clapacs, Jackquline Denmark, MD   tiZANidine (ZANAFLEX) 4 MG tablet Take 1 tablet (4 mg total) by mouth 3 (three) times daily. 10/01/19 03/29/20  Delano Metz, MD  torsemide (DEMADEX) 20 MG tablet Take 1 tablet (20 mg total) by mouth daily. 12/30/18   Minna Antis, MD  traZODone (DESYREL) 50 MG tablet Take 50 mg by mouth at bedtime.    [provider]  venlafaxine (EFFEXOR) 75 MG tablet TK 1 T PO BID 09/06/18   [provider]    Allergies Contrast media [iodinated diagnostic agents], Gabapentin, Labetalol, Sulfabenzamide, Fentanyl, and Penicillins  Family Hx Family History  Problem Relation Age of Onset  . Dementia Mother   . Breast cancer Mother   . Throat cancer Father     Social Hx Social History   Tobacco Use  . Smoking status: Never Smoker  . Smokeless tobacco: Never Used  Substance Use Topics  . Alcohol use: Yes    Comment: occasional  . Drug use: No     Review of Systems  Constitutional: Negative for fever, chills. Eyes: Negative for visual changes. ENT: Negative for sore throat. Cardiovascular: Negative for chest pain. Respiratory: Negative for shortness of breath. Gastrointestinal: Negative for nausea, vomiting.  Genitourinary: + for dysuria. Musculoskeletal: + R ankle pain Skin: Negative for rash. Neurological: Negative for for headaches.   Physical Exam  Vital Signs: ED Triage Vitals  Enc Vitals Group     BP 11/22/19 1118 (!) 73/44     Pulse Rate 11/22/19 1118 (!) 109     Resp 11/22/19 1118 20     Temp 11/22/19 1118 98.8 F (37.1 C)     Temp Source 11/22/19 1118 Oral     SpO2 11/22/19 1118 98 %     Weight 11/22/19 1121 157 lb (71.2 kg)     Height 11/22/19 1121 5\' 3"  (1.6 m)     Head Circumference --      Peak Flow --      Pain Score 11/22/19 1120 10     Pain Loc --      Pain Edu? --      Excl. in GC? --     Constitutional: Alert and oriented.  Head: Normocephalic. Atraumatic. Eyes: Conjunctivae clear. Sclera anicteric. Nose: No congestion. No  rhinorrhea. Mouth/Throat: Wearing mask. MM dry.  Neck: No stridor.   Cardiovascular: Normal rate, regular rhythm. Extremities well perfused. 2+ right DP pulses.  Toes WWP. Cap refill < 2 seconds.  Respiratory:  Normal respiratory effort.  Lungs CTAB. On baseline 4 L [place]. Gastrointestinal: Soft. Non-tender. Non-distended.  Musculoskeletal: Obvious swelling about the R ankle and dorsum of the foot. TTP at the lateral malleolus and dorsum of foot. Closed. 2+ DP pulse. Toes WWP. Cap refill < 2 seconds. Compartments soft and compressible. NV in tact. Neurologic:  Normal speech and language. No gross focal neurologic deficits are appreciated. Able to wiggle toes on R. Sensation in tact.   Skin: Skin is warm, dry and intact. No breaks in skin.  Psychiatric: Mood and affect are appropriate for situation.  EKG  Personally reviewed.   Rate: 107 Rhythm: sinus Axis: normal Intervals: WNL Sinus tachycardia, no acute ischemic changes, wandering baseline No evidence of prolonged QTC, Brug[place], or WPW No STEMI    Radiology  CXR: IMPRESSION:  Similar findings compatible with provided history of pulmonary  sarcoidosis without definitive superimposed acute cardiopulmonary  disease.   XR ankle: IMPRESSION:  Acute bimalleolar ankle fracture involving the distal fibula and  posterior malleolus.   XR foot: IMPRESSION:  1. Acute bimalleolar ankle fractures involving the distal fibula and  posterior malleolus, as better demonstrated on dedicated ankle  radiographs performed earlier same day.  2. No additional fractures identified.  3. Mild degenerative change of the first MTP joint without  associated hallux valgus deformity.   XR knee: IMPRESSION:  1. No definite acute findings.  2. Moderate degenerative change of the knee.   [place]:  IMPRESSION:  No evidence of DVT within the right lower extremity.    Procedures  Procedure(s) performed (including critical care):  Procedures   Initial  Impression / Assessment and Plan / ED Course  63 y.o. female who presents to the ED for R ankle injury after fall. Also noted to be hypotensive on arrival in setting of decreased PO intake. Also complains of possible UTI.  Will obtain imaging to evaluate for fracture. US of the RLE given her complaint of mild swelling x several weeks. Will obtain labs, EKG given her hypotension. Suspect likely dehydration based on history, also consider UTI. No fevers/tachycardia s/o sepsis.   Labs reveal AKI, Cr 1.46 from normal previously. Receiving fluids with appropriate improvement in BP. Mild anemia at baseline. EKG w/o acute ischemia or arrhythmia. Troponin x 2 negative.   XR reveals bimalleolar fracture. Will splint and discuss with orthopedics. They are currently in the OR and will call back. Awaiting urine, completing fluids. BP improving with hydration. Patient care transferred to oncoming MD due to shift change.    Final Clinical Impression(s) / ED Diagnosis  Final diagnoses:  Fall, initial encounter  Dehydration  Closed fracture of right ankle, initial encounter  AKI (acute kidney injury) ([place])       Note:  This document was prepared using Dragon voice recognition software and may include unintentional dictation errors.   Lilia Pro., MD 11/22/19 [phone]

## 2019-11-22 NOTE — ED Notes (Signed)
IV attempted x3 unsuccessful. Will consult IV team.

## 2019-11-22 NOTE — ED Provider Notes (Signed)
-----------------------------------------   7:47 PM on 11/22/2019 -----------------------------------------  Patient resting comfortably.  Case discussed with Dr. Sabra Heck who recommends splinting and outpatient follow-up  Right lower leg splint reviewed, good capillary refill all digits, able to wiggle toes without difficulty.  Splint appears appropriately applied  From patient reports she feels well, pain fairly well controlled does take oxycodone at home, would like 1 tablet here and has prescription already at home will also discuss with her pain specialist  Understands plan to follow-up closely with Dr. Sabra Heck.  Vital signs have improved including blood pressure after fluid bolus.  Patient comfortable with plan for follow-up and careful return precautions.  Reports she does have mobility devices including a walker, house already set up for her and she does not anticipate any troubles with being nonweightbearing on the right lower extremity  Return precautions and treatment recommendations and follow-up discussed with the patient who is agreeable with the plan.          Delman Kitten, MD 11/22/19 1949

## 2019-11-24 ENCOUNTER — Telehealth: Payer: Self-pay | Admitting: *Deleted

## 2019-11-24 NOTE — Telephone Encounter (Signed)
Informed her that she may receive additional meds from another physician for acute situation.

## 2019-11-25 ENCOUNTER — Encounter: Payer: Self-pay | Admitting: Pain Medicine

## 2019-11-25 NOTE — Progress Notes (Signed)
Pain Management Virtual Encounter Note - Virtual Visit via Telephone Telehealth (real-time audio visits between healthcare provider and patient).   Patient's Phone No. & Preferred Pharmacy:  [phone] (home); [phone] (mobile); (Preferred) [phone] No e-mail address on record  [facility] [place] [address] [place] [address] Phone: [phone] Fax: [phone]    Pre-screening note:  Our staff contacted Jean Gomez and offered her an "in person", "face-to-face" appointment versus a telephone encounter. She indicated preferring the telephone encounter, at this time.   Reason for Virtual Visit: COVID-19*  Social distancing based on CDC and AMA recommendations.   I contacted Jean Reeksatheryn F Gomez on 11/26/2019 via telephone.      I clearly identified myself as Oswaldo DoneFrancisco A Lindon Kiel, MD. I verified that I was speaking with the correct person using two identifiers (Name: Jean Jean F Gomez, and date of birth: 1956-03-10).  Advanced Informed Consent I sought verbal advanced consent from Jean Reeksatheryn F Aughenbaugh for virtual visit interactions. I informed Jean Gomez of possible security and privacy concerns, risks, and limitations associated with providing "not-in-person" medical evaluation and management services. I also informed Jean Gomez of the availability of "in-person" appointments. Finally, I informed her that there would be a charge for the virtual visit and that she could be  personally, fully or partially, financially responsible for it. Jean Gomez expressed understanding and agreed to proceed.   Historic Elements   Ms. Jean Jean Gomez is a 63 y.o. year old, female patient evaluated today after her last encounter by our practice on 11/24/2019. Jean Gomez  has a past medical history of Acute postoperative pain (01/16/2019), Allergy, Asthma, Cerebral aneurysm, Chest pain (11/01/2016), Chronic migraine, Chronic respiratory failure (HCC), COPD (chronic  obstructive pulmonary disease) (HCC), Depression, Hypertension, Osteoarthritis, Pneumonia, Sarcoid, and Seizures (HCC). She also  has a past surgical history that includes Cholecystectomy; Abdominal hysterectomy; Spinal fusion; Back surgery; Esophagogastroduodenoscopy (egd) with propofol (N/A, 08/19/2019); and Colonoscopy with propofol (N/A, 08/19/2019). Jean Gomez has a current medication list which includes the following prescription(s): albuterol, albuterol sulfate, alprazolam, amlodipine, aspirin ec, atorvastatin, betamethasone (augmented), budesonide-formoterol, calcium carbonate, citalopram, clotrimazole-betamethasone, dexilant, diclofenac sodium, diphenhydramine, doxycycline, medical compression stockings, hydrochlorothiazide, hydroxyzine, ipratropium, lactulose, levetiracetam, lidocaine, linaclotide, losartan, mirtazapine, nitroglycerin, olmesartan, ondansetron, oxycodone hcl, oxycodone hcl, pantoprazole, potassium chloride sa, pregabalin, promethazine, quetiapine, sucralfate, tizanidine, torsemide, trazodone, venlafaxine, and oxycodone hcl. She  reports that she has never smoked. She has never used smokeless tobacco. She reports current alcohol use. She reports that she does not use drugs. Jean Gomez is allergic to contrast media [iodinated diagnostic agents]; gabapentin; labetalol; sulfabenzamide; fentanyl; and penicillins.   HPI  Today, she is being contacted for medication management.  The patient is scheduled today to evaluate the Lyrica titration.  The patient indicates that she stopped taking the Lyrica because she started having some numbness and half of her face.  I told her that I did not think that that was a side effect of the Lyrica and that she should be concerned about the possibility of having a stroke if that begins to happen again.  I reviewed with her what symptoms of a stroke are and I reminded her that if she starts having any difficulty speaking, moving, or begins to have weakness or  numbness in half of her face or body, she should immediately go to the emergency room to be evaluated.  In addition to this the patient refers having stepped wrong and broken her ankle and she is  currently undergoing treatment for that.  She is well aware that we do allow for the patient to have additional pain medicine for painful emergencies or for [place] operative pain, but this needs to be provided by the treating physician and she is not allowed to go up on our medicines.  Her responsibilities to simply let us know when something like this happen sent to let us know that she has received additional pain medicine to treat the new acute problem.  Pharmacotherapy Assessment  Analgesic: Oxycodone IR 10 mg 1 tab PO q8 hrs (30 mg/day of oxycodone) MME/day:45 MME/day.   Monitoring: Pharmacotherapy: No side-effects or adverse reactions reported. [place] PMP: PDMP reviewed during this encounter.       Compliance: No problems identified. Effectiveness: Clinically acceptable. Plan: Refer to "POC".  UDS:  Summary  Date Value Ref Range Status  03/27/2018 FINAL  Final    Comment:    ==================================================================== TOXASSURE SELECT 13 (MW) ==================================================================== Test                             Result       Flag       Units Drug Present and Declared for Prescription Verification   Hydrocodone                    646          EXPECTED   ng/mg creat   Hydromorphone                  367          EXPECTED   ng/mg creat   Dihydrocodeine                 317          EXPECTED   ng/mg creat   Norhydrocodone                 2063         EXPECTED   ng/mg creat    Sources of hydrocodone include scheduled prescription    medications. Hydromorphone, dihydrocodeine and norhydrocodone are    expected metabolites of hydrocodone. Hydromorphone and    dihydrocodeine are also available as scheduled prescription     medications. ==================================================================== Test                      Result    Flag   Units      Ref Range   Creatinine              24               mg/dL      >=56 ==================================================================== Declared Medications:  The flagging and interpretation on this report are based on the  following declared medications.  Unexpected results may arise from  inaccuracies in the declared medications.  **Note: The testing scope of this panel includes these medications:  Hydrocodone (Hydrocodone-Acetaminophen)  **Note: The testing scope of this panel does not include following  reported medications:  Acetaminophen (Hydrocodone-Acetaminophen)  Albuterol  Amlodipine  Aspirin (Aspirin 81)  Atorvastatin  Budenoside (Symbicort)  Clotrimazole (Lotrisone)  Diclofenac  Escitalopram  Formoterol (Symbicort)  Hydrochlorothiazide (HCTZ)  Ipratropium  Ketoconazole  Lactulose  Levetiracetam  Losartan (Losartan Potassium)  Lubiprostone  Mirtazapine  Nitroglycerin  Ondansetron  Pantoprazole  Potassium  Sucralfate  Tizanidine  Topical  Torsemide  Venlafaxine  Vitamin D2 (Ergocalciferol) ====================================================================  For clinical consultation, please call [phone]. ====================================================================    Laboratory Chemistry Profile (12 mo)  Renal: 11/22/2019: BUN 24; Creatinine, Ser 1.46  Lab Results  Component Value Date   GFRAA 44 (L) 11/22/2019   GFRNONAA 38 (L) 11/22/2019   Hepatic: 10/08/2019: Albumin 4.4 Lab Results  Component Value Date   AST 20 10/08/2019   ALT 21 10/08/2019   Other: No results found for requested labs within last 8760 hours. Note: Above Lab results reviewed.  Imaging  US Venous Img Lower Right (DVT Study) CLINICAL DATA:  Right lower extremity pain and edema. Recent fall. Evaluate for DVT.  EXAM: RIGHT  LOWER EXTREMITY VENOUS DOPPLER ULTRASOUND  TECHNIQUE: Gray-scale sonography with graded compression, as well as color Doppler and duplex ultrasound were performed to evaluate the lower extremity deep venous systems from the level of the common femoral vein and including the common femoral, femoral, profunda femoral, popliteal and calf veins including the posterior tibial, peroneal and gastrocnemius veins when visible. The superficial great saphenous vein was also interrogated. Spectral Doppler was utilized to evaluate flow at rest and with distal augmentation maneuvers in the common femoral, femoral and popliteal veins.  COMPARISON:  None.  FINDINGS: Contralateral Common Femoral Vein: Respiratory phasicity is normal and symmetric with the symptomatic side. No evidence of thrombus. Normal compressibility.  Common Femoral Vein: No evidence of thrombus. Normal compressibility, respiratory phasicity and response to augmentation.  Saphenofemoral Junction: No evidence of thrombus. Normal compressibility and flow on color Doppler imaging.  Profunda Femoral Vein: No evidence of thrombus. Normal compressibility and flow on color Doppler imaging.  Femoral Vein: No evidence of thrombus. Normal compressibility, respiratory phasicity and response to augmentation.  Popliteal Vein: No evidence of thrombus. Normal compressibility, respiratory phasicity and response to augmentation.  Calf Veins: No evidence of thrombus. Normal compressibility and flow on color Doppler imaging.  Superficial Great Saphenous Vein: No evidence of thrombus. Normal compressibility.  Venous Reflux:  None.  Other Findings:  None.  IMPRESSION: No evidence of DVT within the right lower extremity.  Electronically Signed   By: Simonne Come M.D.   On: 11/22/2019 13:58 DG Knee 1-2 Views Right CLINICAL DATA:  Fall from bed this morning, now with right knee pain.  EXAM: RIGHT KNEE - 1-2 VIEW  COMPARISON:   None.  FINDINGS: Examination is degraded due to obliquity and patient's inability to tolerate standard positioning.  No definite fracture or dislocation. Moderate degenerative change involving the medial and lateral compartments with joint space loss, subchondral sclerosis and osteophytosis. No definite knee joint effusion. Regional soft tissues appear normal. No radiopaque foreign body.  IMPRESSION: 1. No definite acute findings. 2. Moderate degenerative change of the knee.  Electronically Signed   By: Simonne Come M.D.   On: 11/22/2019 13:19 DG Foot Complete Right CLINICAL DATA:  Post fall getting out of bed earlier this morning now with right foot and ankle pain.  EXAM: RIGHT FOOT COMPLETE - 3+ VIEW  COMPARISON:  Right ankle radiographs-01/22/2019  FINDINGS: Redemonstrated though suboptimally evaluated fractures involving the distal fibula and posterior malleolus.  No additional fractures identified.  Mild degenerative change of the first MTP joint with joint space loss, articular surface irregularity and subchondral sclerosis. No significant hallux valgus deformity. Remaining joint spaces are preserved. No erosions. Note is made of a benign bony excrescence arising from the talar neck. Tiny plantar calcaneal spur.  Expected soft tissue swelling about the hind and midfoot. No radiopaque foreign body.  IMPRESSION: 1. Acute bimalleolar  ankle fractures involving the distal fibula and posterior malleolus, as better demonstrated on dedicated ankle radiographs performed earlier same day. 2. No additional fractures identified. 3. Mild degenerative change of the first MTP joint without associated hallux valgus deformity.  Electronically Signed   By: Sandi Mariscal M.D.   On: 11/22/2019 13:17 DG Chest Port 1 View CLINICAL DATA:  Fall getting out of bed this morning. History of sarcoid, COPD and asthma.  EXAM: PORTABLE CHEST 1 VIEW  COMPARISON:  07/14/2019;  12/29/2018 chest CT-01/22/2018  FINDINGS: Grossly unchanged cardiac silhouette and mediastinal contours. Architectural distortion about the bilateral pulmonary hila with associated volume loss and scattered bilateral ill-defined nodular interstitial opacities appears similar to the 07/14/2019 examination and compatible provided history of sarcoidosis. No new focal airspace opacities. No pleural effusion or pneumothorax. No evidence of edema. No acute osseous abnormalities.  IMPRESSION: Similar findings compatible with provided history of pulmonary sarcoidosis without definitive superimposed acute cardiopulmonary disease.  Electronically Signed   By: Sandi Mariscal M.D.   On: 11/22/2019 13:15 DG Ankle Complete Right CLINICAL DATA:  [place] getting out of bed this morning now with right ankle pain.  EXAM: RIGHT ANKLE - COMPLETE 3+ VIEW  COMPARISON:  Right foot radiographs-earlier same day  FINDINGS: There is an acute, obliquely oriented fracture involving the distal fibula with extension to involve the distal tib-fib joint. Additionally, there is a suspected nondisplaced fracture involving the posterior malleolus.  Expected adjacent soft tissue swelling and small ankle joint effusion. No radiopaque foreign body. Joint spaces are preserved. The ankle mortise appears preserved. Tiny plantar calcaneal spur. No radiopaque body  IMPRESSION: Acute bimalleolar ankle fracture involving the distal fibula and posterior malleolus.  Electronically Signed   By: Sandi Mariscal M.D.   On: 11/22/2019 13:13   Assessment  The primary encounter diagnosis was Occipital neuralgia (Primary Area of Pain) (Bilateral) (R>L). 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  Plan of Care  Problem-specific:  No  problem-specific Assessment & Plan notes found for this encounter.  I have discontinued Onelia F. Schwanke's Aspirin-Calcium Carbonate. I am also having her maintain her Albuterol Sulfate, betamethasone (augmented), ipratropium, nitroGLYCERIN, aspirin EC, amLODipine, hydrochlorothiazide, losartan, pantoprazole, sucralfate, potassium chloride SA, clotrimazole-betamethasone, venlafaxine, levETIRAcetam, atorvastatin, linaclotide, citalopram, ondansetron, diphenhydrAMINE, torsemide, Medical Compression Stockings, QUEtiapine, ALPRAZolam, doxycycline, mirtazapine, olmesartan, albuterol, budesonide-formoterol, calcium carbonate, lactulose, traZODone, Dexilant, hydrOXYzine, promethazine, Oxycodone HCl, tiZANidine, lidocaine, diclofenac sodium, Oxycodone HCl, pregabalin, and Oxycodone HCl.  Pharmacotherapy (Medications Ordered): Meds ordered this encounter  Medications  . Oxycodone HCl 10 MG TABS    Sig: Take 1 tablet (10 mg total) by mouth every 8 (eight) hours as needed. Must last 30 days.    Dispense:  90 tablet    Refill:  0    Chronic Pain: STOP Act (Not applicable) Fill 1 day early if closed on refill date. Do not fill until: 01/31/2020. To last until: 03/01/2020. Avoid benzodiazepines within 8 hours of opioids   Orders:  No orders of the defined types were placed in this encounter.  Follow-up plan:   Return in about 3 months (around 03/01/2020) for (VV), (MM).      Interventional treatment options:  Under consideration: NOTE: CONTRAST & FENTANYL Allergy. Diagnostic left L4 TFESI #1 Diagnostic/therapeutic right L5-S1 IA synovial cyst facet joint injection #1  Diagnostic bilateral L5 TFESI #1  Possible  bilateral occipital nerve RFA Possible bilateral occipital nerve peripheral nerves stimulator trial Diagnostic bilateral cervical facet block Possible bilateral cervical facet RFA Diagnostic right CESI Diagnostic bilateral suprascapular NB Possible bilateral suprascapular nerve  RFA Diagnostic right LESI Diagnostic left IA hip joint injection Diagnostic bilateral femoral + obturator NB Possible bilateral femoral + obturator nerve RFA Diagnostic bilateral genicular NB Possible bilateral genicular RFA   Therapeutic/palliative (PRN): Diagnostic midline caudal ESI #2 (100/90/40/<50)  Diagnostic right IA hip joint injection #2  Diagnostic right trochanteric bursa injection #2  Diagnostic bilateral IA shoulder joint injection #2 (100/100/50/75)  Diagnostic right L5 TFESI #2 (100/100/97/>50)  Diagnostic left L4-5 LESI #2 (100/100/97/>50)  Palliative bilateral lumbar facet blocks #4 (100/100/90/50)  Palliative right lumbar facet RFA #2 (last done 01/16/2019) Palliative left lumbar facet RFA #2 (last done 01/30/2019) Diagnostic bilateral GONB#2(100/0/25/50)  Palliative bilateral IA knee joint injection (w/ steroid)  Palliative bilateral IA Hyalgan knee injections(series#2) (series#1completed on 07/04/2018)     Recent Visits Date Type Provider Dept  10/29/19 Telemedicine Delano Metz, MD [facility]  10/09/19 Procedure visit Delano Metz, MD [facility]  10/01/19 Office Visit Delano Metz, MD [facility]  09/23/19 Procedure visit Delano Metz, MD [facility]  09/03/19 Office Visit Delano Metz, MD [facility]  Showing recent visits within past 90 days and meeting all other requirements   Future Appointments Date Type Provider Dept  12/01/19 Appointment Delano Metz, MD [facility]  Showing future appointments within next 90 days and meeting all other requirements   I discussed the assessment and treatment plan with the patient. The patient was provided an opportunity to ask questions and all were answered. The patient agreed with the plan and demonstrated an understanding of the instructions.  Patient advised to call back or seek an in-person evaluation if  the symptoms or condition worsens.  Total duration of non-face-to-face encounter: 22 minutes.  Note by: Oswaldo Done, MD Date: 11/26/2019; Time: 3:57 PM  Note: This dictation was prepared with Dragon dictation. Any transcriptional errors that may result from this process are unintentional.  Disclaimer:  * Given the special circumstances of the COVID-19 pandemic, the federal government has announced that the Office for Civil Rights (OCR) will exercise its enforcement discretion and will not impose penalties on physicians using telehealth in the event of noncompliance with regulatory requirements under the [employer] Portability and Accountability Act (HIPAA) in connection with the good faith provision of telehealth during the COVID-19 national public health emergency. (AMA)

## 2019-11-26 ENCOUNTER — Ambulatory Visit (HOSPITAL_BASED_OUTPATIENT_CLINIC_OR_DEPARTMENT_OTHER): Payer: Medicare Other | Admitting: Pain Medicine

## 2019-11-26 ENCOUNTER — Other Ambulatory Visit: Payer: Self-pay

## 2019-11-26 DIAGNOSIS — M792 Neuralgia and neuritis, unspecified: Secondary | ICD-10-CM | POA: Diagnosis not present

## 2019-11-26 DIAGNOSIS — M5481 Occipital neuralgia: Secondary | ICD-10-CM

## 2019-11-26 DIAGNOSIS — G8929 Other chronic pain: Secondary | ICD-10-CM

## 2019-11-26 DIAGNOSIS — M545 Low back pain: Secondary | ICD-10-CM

## 2019-11-26 DIAGNOSIS — G894 Chronic pain syndrome: Secondary | ICD-10-CM | POA: Diagnosis not present

## 2019-11-26 DIAGNOSIS — M79604 Pain in right leg: Secondary | ICD-10-CM

## 2019-11-26 DIAGNOSIS — M79605 Pain in left leg: Secondary | ICD-10-CM

## 2019-11-26 MED ORDER — OXYCODONE HCL 10 MG PO TABS: 10.0000 mg | ORAL_TABLET | Freq: Three times a day (TID) | ORAL | 0 refills | Status: DC | PRN

## 2019-11-27 ENCOUNTER — Telehealth: Payer: Self-pay | Admitting: Pain Medicine

## 2019-11-27 NOTE — Telephone Encounter (Signed)
Attempted to call [facility], message left.

## 2019-11-27 NOTE — Telephone Encounter (Signed)
Jean Gomez called asking if it is ok for Jean Gomez to get script for 30 Oxy 10 1 tab every 4 hours filled from Dr. Kurtis Bushman for her broken foot? Also this is the same med the patient is already taking from Dr. Dossie Arbour and is due to be filled for 90. They are concerned about patient taking too much of same meds. Please call [facility] ask for Altha Harm or Alyse Low and let them know asap.

## 2019-12-01 ENCOUNTER — Telehealth: Payer: Medicare Other | Admitting: Pain Medicine

## 2019-12-01 ENCOUNTER — Telehealth: Payer: Self-pay | Admitting: *Deleted

## 2019-12-01 NOTE — Telephone Encounter (Signed)
Returned patient call concerning her prescription for oxycodone 10 mg. Informed patient that she can pick up a refill tomorrow at the pharmacy 12/02/19 and not to get the prescription filled she received from Dr. Marica Otter. Patient verbalized understanding.

## 2019-12-02 ENCOUNTER — Encounter: Payer: Self-pay | Admitting: Psychology

## 2019-12-02 ENCOUNTER — Encounter: Payer: Medicare Other | Attending: Psychology | Admitting: Psychology

## 2019-12-02 ENCOUNTER — Other Ambulatory Visit: Payer: Self-pay

## 2019-12-02 DIAGNOSIS — F0630 Mood disorder due to known physiological condition, unspecified: Secondary | ICD-10-CM

## 2019-12-02 DIAGNOSIS — R519 Headache, unspecified: Secondary | ICD-10-CM | POA: Diagnosis present

## 2019-12-02 DIAGNOSIS — M545 Low back pain, unspecified: Secondary | ICD-10-CM

## 2019-12-02 DIAGNOSIS — J9621 Acute and chronic respiratory failure with hypoxia: Secondary | ICD-10-CM

## 2019-12-02 DIAGNOSIS — G8929 Other chronic pain: Secondary | ICD-10-CM

## 2019-12-02 DIAGNOSIS — G4486 Cervicogenic headache: Secondary | ICD-10-CM
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
## 2019-12-03 ENCOUNTER — Encounter: Payer: Self-pay | Admitting: Psychology

## 2019-12-09 ENCOUNTER — Other Ambulatory Visit: Payer: Self-pay | Admitting: Pain Medicine

## 2019-12-09 NOTE — Progress Notes (Unsigned)
Patient cleared by medical psychology for possible implant trial.

## 2019-12-10 NOTE — Telephone Encounter (Signed)
Dena please finish note and sign so we can clear from list.  Thank you

## 2019-12-31 ENCOUNTER — Ambulatory Visit: Payer: Medicare Other | Admitting: Pain Medicine

## 2020-01-01 ENCOUNTER — Encounter: Payer: Self-pay | Admitting: Pain Medicine

## 2020-01-04 NOTE — Progress Notes (Signed)
Patient: Jean ReeksCatheryn F Maiorana  Service Category: E/M  Provider: Oswaldo DoneFrancisco A Cheresa Siers, MD  DOB: 02/16/1956  DOS: 01/05/2020  Location: Office  MRN: [id number]  Setting: Ambulatory outpatient  Referring Provider: Sherron Mondayejan-[name], S Ahmed, MD  Type: Established Patient  Specialty: Interventional Pain Management  PCP: Sherron Mondayejan-[name], S Ahmed, MD  Location: Remote location  Delivery: TeleHealth     Virtual Encounter - Pain Management PROVIDER NOTE: Information contained herein reflects review and annotations entered in association with encounter. Interpretation of such information and data should be left to medically-trained personnel. Information provided to patient can be located elsewhere in the medical record under "Patient Instructions". Document created using STT-dictation technology, any transcriptional errors that may result from process are unintentional.    Contact & Pharmacy Preferred: [phone] Home: [phone] (home) Mobile: [phone] (mobile) E-mail: No e-mail address on record  [facility] [place] [address] [place] [place] [address] Phone: [phone] Fax: [phone]   Pre-screening  Jean Gomez offered "in-person" vs "virtual" encounter. She indicated preferring virtual for this encounter.   Reason COVID-19*  Social distancing based on CDC and AMA recommendations.   I contacted Jean Reeksatheryn F Gomez on 01/05/2020 via telephone.      I clearly identified myself as Oswaldo DoneFrancisco A Tamekia Rotter, MD. I verified that I was speaking with the correct person using two identifiers (Name: Jean Gomez, and date of birth: 02/16/1956).  Consent I sought verbal advanced consent from Jean Reeksatheryn F Gomez for virtual visit interactions. I informed Jean Gomez of possible security and privacy concerns, risks, and limitations associated with providing "not-in-person" medical evaluation and management services. I also informed Jean Gomez of the availability of "in-person" appointments.  Finally, I informed her that there would be a charge for the virtual visit and that she could be  personally, fully or partially, financially responsible for it. Jean Gomez expressed understanding and agreed to proceed.   Historic Elements   [name] is a 64 y.o. year old, female patient evaluated today after her last encounter by our practice on 12/01/2019. Jean Gomez  has a past medical history of Acute postoperative pain (01/16/2019), Allergy, Asthma, Cerebral aneurysm, Chest pain (11/01/2016), Chronic migraine, Chronic respiratory failure (HCC), COPD (chronic obstructive pulmonary disease) (HCC), Depression, Hypertension, Osteoarthritis, Pneumonia, Sarcoid, and Seizures (HCC). She also  has a past surgical history that includes Cholecystectomy; Abdominal hysterectomy; Spinal fusion; Back surgery; Esophagogastroduodenoscopy (egd) with propofol (N/A, 08/19/2019); and Colonoscopy with propofol (N/A, 08/19/2019). Jean Gomez has a current medication list which includes the following prescription(s): albuterol, albuterol sulfate, alprazolam, amlodipine, aspirin ec, atorvastatin, betamethasone (augmented), budesonide-formoterol, calcium carbonate, citalopram, clotrimazole-betamethasone, dexilant, diclofenac sodium, diphenhydramine, doxycycline, medical compression stockings, hydrochlorothiazide, hydroxyzine, ipratropium, levetiracetam, lidocaine, linaclotide, losartan, nitroglycerin, oxycodone hcl, [START ON 01/31/2020] oxycodone hcl, [START ON 03/01/2020] oxycodone hcl, pantoprazole, potassium chloride sa, promethazine, quetiapine, sucralfate, tizanidine, torsemide, trazodone, and venlafaxine. She  reports that she has never smoked. She has never used smokeless tobacco. She reports current alcohol use. She reports that she does not use drugs. Jean Gomez is allergic to contrast media [iodinated diagnostic agents]; gabapentin; labetalol; sulfabenzamide; fentanyl; penicillins; and pregabalin.   HPI  Today, she is  being contacted for medication management.  The patient indicates doing well with the current medication regimen. No adverse reactions or side effects reported to the medications.  On the last encounter (11/26/2019) the patient indicated that she stopped taking the Lyrica because she started having some numbness and half of her face.  I told her that I did not think that that was a side effect of the Lyrica and that she should be concerned about the possibility of having a stroke if that begins to happen again.  I reviewed with her what symptoms of a stroke are and I reminded her that if she starts having any difficulty speaking, moving, or begins to have weakness or numbness in half of her face or body, she should immediately go to the emergency room to be evaluated.  In any case, she has not resumed taking the Lyrica.  She indicates that she disposed of them, but she also did not complain of any further numbness in her face.  In addition she referred having stepped wrong and broken her ankle and she is currently undergoing treatment for that.  She is well aware that we do allow for the patient to have additional pain medicine for painful emergencies or for [place] operative pain, but this needs to be provided by the treating physician and she is not allowed to go up on our medicines.  Her responsibilities to simply let us know when something like this happen sent to let us know that she has received additional pain medicine to treat the new acute problem.  I have the results of the medical psychology evaluation done by Dr. Arley Phenix (neuropsychologist).  This evaluation indicates that "overall, the results of the current psychological/neuropsychological evaluation do strongly suggest that the patient is an appropriate candidate from a psychological/psychiatric perspective for spinal cord stimulator trialing and possible implantation".  In view of this, today I have talked to the patient about this alternative  and she is interested in pursuing the trial.  Because she still has her cast from her recent foot fracture, we will try to schedule this trial after the cast comes out.  Medical Necessity: Jean Gomez has experienced debilitating chronic pain from the  Lumbosacral spondylosis with radiculopathy  that has persisted for longer than three months of failed non-surgical care and has either failed to respond, or was unable to tolerate, or simply did not get enough benefit from other more conservative therapies including, but not limited to: 1. Over-the-counter oral analgesic medications (i.e.: ibuprofen, naproxen, etc.) 2. Anti-inflammatory medications 3. Muscle relaxants 4. Membrane stabilizers 5. Opioids 6. Physical therapy (PT), chiropractic manipulation, and/or home exercise program (HEP). 7. Modalities (Heat, ice, etc.) 8. Invasive techniques such as nerve blocks.  Jean Gomez has attained greater than 50% reduction in pain from diagnostic injections conducted in separate occasions. For this reason, I believe it is medically necessary to proceed with a Spinal Cord Stimulator Trial for the purpose of attempting to prolong the duration of the benefits seen with the diagnostic injections.  Pharmacotherapy Assessment  Analgesic: Oxycodone IR 10 mg 1 tab PO q8 hrs (30 mg/day of oxycodone) MME/day:45 MME/day.   Monitoring: Pharmacotherapy: No side-effects or adverse reactions reported. [place] PMP: PDMP reviewed during this encounter.       Compliance: No problems identified. Effectiveness: Clinically acceptable. Plan: Refer to "POC".  UDS:  Summary  Date Value Ref Range Status  03/27/2018 FINAL  Final    Comment:    ==================================================================== TOXASSURE SELECT 13 (MW) ==================================================================== Test                             Result       Flag       Units Drug Present and Declared for Prescription Verification  Hydrocodone                    646          EXPECTED   ng/mg creat   Hydromorphone                  367          EXPECTED   ng/mg creat   Dihydrocodeine                 317          EXPECTED   ng/mg creat   Norhydrocodone                 2063         EXPECTED   ng/mg creat    Sources of hydrocodone include scheduled prescription    medications. Hydromorphone, dihydrocodeine and norhydrocodone are    expected metabolites of hydrocodone. Hydromorphone and    dihydrocodeine are also available as scheduled prescription    medications. ==================================================================== Test                      Result    Flag   Units      Ref Range   Creatinine              24               mg/dL      >=47 ==================================================================== Declared Medications:  The flagging and interpretation on this report are based on the  following declared medications.  Unexpected results may arise from  inaccuracies in the declared medications.  **Note: The testing scope of this panel includes these medications:  Hydrocodone (Hydrocodone-Acetaminophen)  **Note: The testing scope of this panel does not include following  reported medications:  Acetaminophen (Hydrocodone-Acetaminophen)  Albuterol  Amlodipine  Aspirin (Aspirin 81)  Atorvastatin  Budenoside (Symbicort)  Clotrimazole (Lotrisone)  Diclofenac  Escitalopram  Formoterol (Symbicort)  Hydrochlorothiazide (HCTZ)  Ipratropium  Ketoconazole  Lactulose  Levetiracetam  Losartan (Losartan Potassium)  Lubiprostone  Mirtazapine  Nitroglycerin  Ondansetron  Pantoprazole  Potassium  Sucralfate  Tizanidine  Topical  Torsemide  Venlafaxine  Vitamin D2 (Ergocalciferol) ==================================================================== For clinical consultation, please call [phone]. ====================================================================    Laboratory Chemistry  Profile (12 mo)  Renal: 11/22/2019: BUN 24; Creatinine, Ser 1.46  Lab Results  Component Value Date   GFRAA 44 (L) 11/22/2019   GFRNONAA 38 (L) 11/22/2019   Hepatic: 10/08/2019: Albumin 4.4 Lab Results  Component Value Date   AST 20 10/08/2019   ALT 21 10/08/2019   Other: No results found for requested labs within last 8760 hours. Note: Above Lab results reviewed.  Imaging  US Venous Img Lower Right (DVT Study) CLINICAL DATA:  Right lower extremity pain and edema. Recent fall. Evaluate for DVT.  EXAM: RIGHT LOWER EXTREMITY VENOUS DOPPLER ULTRASOUND  TECHNIQUE: Gray-scale sonography with graded compression, as well as color Doppler and duplex ultrasound were performed to evaluate the lower extremity deep venous systems from the level of the common femoral vein and including the common femoral, femoral, profunda femoral, popliteal and calf veins including the posterior tibial, peroneal and gastrocnemius veins when visible. The superficial great saphenous vein was also interrogated. Spectral Doppler was utilized to evaluate flow at rest and with distal augmentation maneuvers in the common femoral, femoral and popliteal veins.  COMPARISON:  None.  FINDINGS: Contralateral Common Femoral Vein: Respiratory phasicity is normal and symmetric with  the symptomatic side. No evidence of thrombus. Normal compressibility.  Common Femoral Vein: No evidence of thrombus. Normal compressibility, respiratory phasicity and response to augmentation.  Saphenofemoral Junction: No evidence of thrombus. Normal compressibility and flow on color Doppler imaging.  Profunda Femoral Vein: No evidence of thrombus. Normal compressibility and flow on color Doppler imaging.  Femoral Vein: No evidence of thrombus. Normal compressibility, respiratory phasicity and response to augmentation.  Popliteal Vein: No evidence of thrombus. Normal compressibility, respiratory phasicity and response to  augmentation.  Calf Veins: No evidence of thrombus. Normal compressibility and flow on color Doppler imaging.  Superficial Great Saphenous Vein: No evidence of thrombus. Normal compressibility.  Venous Reflux:  None.  Other Findings:  None.  IMPRESSION: No evidence of DVT within the right lower extremity.  Electronically Signed   By: Simonne ComeJohn  Watts M.D.   On: 11/22/2019 13:58 DG Knee 1-2 Views Right CLINICAL DATA:  Fall from bed this morning, now with right knee pain.  EXAM: RIGHT KNEE - 1-2 VIEW  COMPARISON:  None.  FINDINGS: Examination is degraded due to obliquity and patient's inability to tolerate standard positioning.  No definite fracture or dislocation. Moderate degenerative change involving the medial and lateral compartments with joint space loss, subchondral sclerosis and osteophytosis. No definite knee joint effusion. Regional soft tissues appear normal. No radiopaque foreign body.  IMPRESSION: 1. No definite acute findings. 2. Moderate degenerative change of the knee.  Electronically Signed   By: Simonne ComeJohn  Watts M.D.   On: 11/22/2019 13:19 DG Foot Complete Right CLINICAL DATA:  Post fall getting out of bed earlier this morning now with right foot and ankle pain.  EXAM: RIGHT FOOT COMPLETE - 3+ VIEW  COMPARISON:  Right ankle radiographs-01/22/2019  FINDINGS: Redemonstrated though suboptimally evaluated fractures involving the distal fibula and posterior malleolus.  No additional fractures identified.  Mild degenerative change of the first MTP joint with joint space loss, articular surface irregularity and subchondral sclerosis. No significant hallux valgus deformity. Remaining joint spaces are preserved. No erosions. Note is made of a benign bony excrescence arising from the talar neck. Tiny plantar calcaneal spur.  Expected soft tissue swelling about the hind and midfoot. No radiopaque foreign body.  IMPRESSION: 1. Acute bimalleolar ankle  fractures involving the distal fibula and posterior malleolus, as better demonstrated on dedicated ankle radiographs performed earlier same day. 2. No additional fractures identified. 3. Mild degenerative change of the first MTP joint without associated hallux valgus deformity.  Electronically Signed   By: Simonne ComeJohn  Watts M.D.   On: 11/22/2019 13:17 DG Chest Port 1 View CLINICAL DATA:  Fall getting out of bed this morning. History of sarcoid, COPD and asthma.  EXAM: PORTABLE CHEST 1 VIEW  COMPARISON:  07/14/2019; 12/29/2018 chest CT-01/22/2018  FINDINGS: Grossly unchanged cardiac silhouette and mediastinal contours. Architectural distortion about the bilateral pulmonary hila with associated volume loss and scattered bilateral ill-defined nodular interstitial opacities appears similar to the 07/14/2019 examination and compatible provided history of sarcoidosis. No new focal airspace opacities. No pleural effusion or pneumothorax. No evidence of edema. No acute osseous abnormalities.  IMPRESSION: Similar findings compatible with provided history of pulmonary sarcoidosis without definitive superimposed acute cardiopulmonary disease.  Electronically Signed   By: Simonne ComeJohn  Watts M.D.   On: 11/22/2019 13:15 DG Ankle Complete Right CLINICAL DATA:  Larey SeatFell getting out of bed this morning now with right ankle pain.  EXAM: RIGHT ANKLE - COMPLETE 3+ VIEW  COMPARISON:  Right foot radiographs-earlier same day  FINDINGS: There is an  acute, obliquely oriented fracture involving the distal fibula with extension to involve the distal tib-fib joint. Additionally, there is a suspected nondisplaced fracture involving the posterior malleolus.  Expected adjacent soft tissue swelling and small ankle joint effusion. No radiopaque foreign body. Joint spaces are preserved. The ankle mortise appears preserved. Tiny plantar calcaneal spur. No radiopaque body  IMPRESSION: Acute bimalleolar ankle  fracture involving the distal fibula and posterior malleolus.  Electronically Signed   By: Simonne Come M.D.   On: 11/22/2019 13:13  Assessment  The primary encounter diagnosis was Chronic low back pain (Secondary Area of Pain) (Bilateral) (R>L) w/ sciatica. Diagnoses of Chronic lower extremity pain (Tertiary Area of Pain) (Bilateral) (R>L), Chronic hip pain (Tertiary Area of Pain) (Bilateral) (R>L), Occipital neuralgia (Primary Area of Pain) (Bilateral) (R>L), Lumbosacral spondylosis with radiculopathy (Right), DDD (degenerative disc disease), lumbosacral, Chronic lumbar radiculopathy (Right), and Chronic pain syndrome were also pertinent to this visit.  Plan of Care  Problem-specific:  No problem-specific Assessment & Plan notes found for this encounter.  I have discontinued Lisbeth F. Sonoda's ondansetron, mirtazapine, olmesartan, lactulose, and pregabalin. I am also having her maintain her Albuterol Sulfate, betamethasone (augmented), ipratropium, nitroGLYCERIN, aspirin EC, amLODipine, hydrochlorothiazide, losartan, pantoprazole, sucralfate, potassium chloride SA, clotrimazole-betamethasone, venlafaxine, levETIRAcetam, atorvastatin, linaclotide, citalopram, diphenhydrAMINE, torsemide, Medical Compression Stockings, QUEtiapine, ALPRAZolam, doxycycline, albuterol, budesonide-formoterol, calcium carbonate, traZODone, Dexilant, hydrOXYzine, promethazine, tiZANidine, lidocaine, diclofenac sodium, Oxycodone HCl, Oxycodone HCl, and Oxycodone HCl.  Pharmacotherapy (Medications Ordered): Meds ordered this encounter  Medications  . Oxycodone HCl 10 MG TABS    Sig: Take 1 tablet (10 mg total) by mouth every 8 (eight) hours as needed. Must last 30 days.    Dispense:  90 tablet    Refill:  0    Chronic Pain: STOP Act (Not applicable) Fill 1 day early if closed on refill date. Do not fill until: 03/01/2020. To last until: 03/31/2020. Avoid benzodiazepines within 8 hours of opioids   Orders:  Orders  Placed This Encounter  Procedures  . SCS - Trial (OP-AMB) (Schedule)    Contact medical implant company representative to notify them of the scheduled case and to make sure they will be available to provide required equipment.    Standing Status:   Future    Standing Expiration Date:   07/04/2020    Scheduling Instructions:     Side: Bilateral     Level: Lumbar     Device: Medtronic     Sedation: With sedation     Timeframe: As soon as pre-approved    Order Specific Question:   Where will this procedure be performed?    Answer:   [facility]   Follow-up plan:   Return in about 12 weeks (around 03/29/2020) for (VV), (MM), in addition, Procedure (w/ sedation): (B) L-SCS Trial implant.      Interventional treatment options:  Under consideration: NOTE: CONTRAST & FENTANYL Allergy. Diagnostic left L4 TFESI #1 Diagnostic/therapeutic right L5-S1 IA synovial cyst facet joint injection #1  Diagnostic bilateral L5 TFESI #1  Possible bilateral occipital nerve RFA Possible bilateral occipital nerve peripheral nerves stimulator trial Diagnostic bilateral cervical facet block Possible bilateral cervical facet RFA Diagnostic right CESI Diagnostic bilateral suprascapular NB Possible bilateral suprascapular nerve RFA Diagnostic right LESI Diagnostic left IA hip joint injection Diagnostic bilateral femoral + obturator NB Possible bilateral femoral + obturator nerve RFA Diagnostic bilateral genicular NB Possible bilateral genicular RFA   Therapeutic/palliative (PRN): Diagnostic midline caudal ESI #2 (100/90/40/<50)  Diagnostic right IA hip joint  injection #2  Diagnostic right trochanteric bursa injection #2  Diagnostic bilateral IA shoulder joint injection #2 (100/100/50/75)  Diagnostic right L5 TFESI #2 (100/100/97/>50)  Diagnostic left L4-5 LESI #2 (100/100/97/>50)  Palliative bilateral lumbar facet blocks #4 (100/100/90/50)  Palliative right lumbar facet RFA #2  (last done 01/16/2019) Palliative left lumbar facet RFA #2 (last done 01/30/2019) Diagnostic bilateral GONB#2(100/0/25/50)  Palliative bilateral IA knee joint injection (w/ steroid)  Palliative bilateral IA Hyalgan knee injections(series#2) (series#1completed on 07/04/2018)      Recent Visits Date Type Provider Dept  10/29/19 Telemedicine Milinda Pointer, MD [facility]  10/09/19 Procedure visit Milinda Pointer, MD [facility]  Showing recent visits within past 90 days and meeting all other requirements   Today's Visits Date Type Provider Dept  01/05/20 Office Visit Milinda Pointer, MD [facility]  Showing today's visits and meeting all other requirements   Future Appointments Date Type Provider Dept  03/01/20 Appointment Milinda Pointer, MD [facility]  Showing future appointments within next 90 days and meeting all other requirements   I discussed the assessment and treatment plan with the patient. The patient was provided an opportunity to ask questions and all were answered. The patient agreed with the plan and demonstrated an understanding of the instructions.  Patient advised to call back or seek an in-person evaluation if the symptoms or condition worsens.  Total duration of non-face-to-face encounter: 16 minutes.  Note by: Gaspar Cola, MD Date: 01/05/2020; Time: 10:37 AM

## 2020-01-05 ENCOUNTER — Other Ambulatory Visit: Payer: Self-pay

## 2020-01-05 ENCOUNTER — Ambulatory Visit: Payer: Medicare Other | Attending: Pain Medicine | Admitting: Pain Medicine

## 2020-01-05 DIAGNOSIS — M25552 Pain in left hip: Secondary | ICD-10-CM | POA: Diagnosis not present

## 2020-01-05 DIAGNOSIS — G8929 Other chronic pain: Secondary | ICD-10-CM

## 2020-01-05 DIAGNOSIS — M5416 Radiculopathy, lumbar region: Secondary | ICD-10-CM

## 2020-01-05 DIAGNOSIS — M5137 Other intervertebral disc degeneration, lumbosacral region: Secondary | ICD-10-CM

## 2020-01-05 DIAGNOSIS — M79605 Pain in left leg: Secondary | ICD-10-CM

## 2020-01-05 DIAGNOSIS — G894 Chronic pain syndrome: Secondary | ICD-10-CM

## 2020-01-05 DIAGNOSIS — M5481 Occipital neuralgia: Secondary | ICD-10-CM

## 2020-01-05 DIAGNOSIS — M25551 Pain in right hip: Secondary | ICD-10-CM

## 2020-01-05 DIAGNOSIS — M4727 Other spondylosis with radiculopathy, lumbosacral region: Secondary | ICD-10-CM

## 2020-01-05 DIAGNOSIS — M79604 Pain in right leg: Secondary | ICD-10-CM | POA: Diagnosis not present

## 2020-01-05 MED ORDER — OXYCODONE HCL 10 MG PO TABS: 10.0000 mg | ORAL_TABLET | Freq: Three times a day (TID) | ORAL | 0 refills | Status: DC | PRN

## 2020-01-05 NOTE — Patient Instructions (Signed)
____________________________________________________________________________________________  Preparing for Procedure with Sedation  Procedure appointments are limited to planned procedures: . No Prescription Refills. . No disability issues will be discussed. . No medication changes will be discussed.  Instructions: . Oral Intake: Do not eat or drink anything for at least 8 hours prior to your procedure. . Transportation: Public transportation is not allowed. Bring an adult driver. The driver must be physically present in our waiting room before any procedure can be started. . Physical Assistance: Bring an adult physically capable of assisting you, in the event you need help. This adult should keep you company at home for at least 6 hours after the procedure. . Blood Pressure Medicine: Take your blood pressure medicine with a sip of water the morning of the procedure. . Blood thinners: Notify our staff if you are taking any blood thinners. Depending on which one you take, there will be specific instructions on how and when to stop it. . Diabetics on insulin: Notify the staff so that you can be scheduled 1st case in the morning. If your diabetes requires high dose insulin, take only  of your normal insulin dose the morning of the procedure and notify the staff that you have done so. . Preventing infections: Shower with an antibacterial soap the morning of your procedure. . Build-up your immune system: Take 1000 mg of Vitamin C with every meal (3 times a day) the day prior to your procedure. . Antibiotics: Inform the staff if you have a condition or reason that requires you to take antibiotics before dental procedures. . Pregnancy: If you are pregnant, call and cancel the procedure. . Sickness: If you have a cold, fever, or any active infections, call and cancel the procedure. . Arrival: You must be in the facility at least 30 minutes prior to your scheduled procedure. . Children: Do not bring  children with you. . Dress appropriately: Bring dark clothing that you would not mind if they get stained. . Valuables: Do not bring any jewelry or valuables.  Reasons to call and reschedule or cancel your procedure: (Following these recommendations will minimize the risk of a serious complication.) . Surgeries: Avoid having procedures within 2 weeks of any surgery. (Avoid for 2 weeks before or after any surgery). . Flu Shots: Avoid having procedures within 2 weeks of a flu shots or . (Avoid for 2 weeks before or after immunizations). . Barium: Avoid having a procedure within 7-10 days after having had a radiological study involving the use of radiological contrast. (Myelograms, Barium swallow or enema study). . Heart attacks: Avoid any elective procedures or surgeries for the initial 6 months after a "Myocardial Infarction" (Heart Attack). . Blood thinners: It is imperative that you stop these medications before procedures. Let us know if you if you take any blood thinner.  . Infection: Avoid procedures during or within two weeks of an infection (including chest colds or gastrointestinal problems). Symptoms associated with infections include: Localized redness, fever, chills, night sweats or profuse sweating, burning sensation when voiding, cough, congestion, stuffiness, runny nose, sore throat, diarrhea, nausea, vomiting, cold or Flu symptoms, recent or current infections. It is specially important if the infection is over the area that we intend to treat. . Heart and lung problems: Symptoms that may suggest an active cardiopulmonary problem include: cough, chest pain, breathing difficulties or shortness of breath, dizziness, ankle swelling, uncontrolled high or unusually low blood pressure, and/or palpitations. If you are experiencing any of these symptoms, cancel your procedure and contact   your primary care physician for an evaluation.  Remember:  Regular Business hours are:  Monday to Thursday  8:00 AM to 4:00 PM  Provider's Schedule: Liliana Dang, MD:  Procedure days: Tuesday and Thursday 7:30 AM to 4:00 PM  Bilal Lateef, MD:  Procedure days: Monday and Wednesday 7:30 AM to 4:00 PM ____________________________________________________________________________________________    Spinal Cord Stimulation Trial Information A spinal cord stimulation trial is a test to see whether a spinal cord stimulator reduces your pain. A spinal cord stimulator is a small device that is inserted (implanted) in your back. The stimulator has small wires (leads) that connect it to your spinal cord. The stimulator sends electrical pulses through the leads to the spinal cord. This can relieve pain. Settings for the stimulator can be adjusted with a remote device to find the best pain control. Your health care provider may suggest a spinal cord stimulation trial if other treatments for long-lasting (chronic) pain have not worked for you. Spinal cord stimulation may be used to manage pain that is caused by:  Coronary artery disease or peripheral vascular disease.  Failed back surgery.  Phantom limb sensation.  Peripheral neuropathy.  Complex regional pain syndrome.  Other syndromes that involve chronic pain. For the trial, the stimulator is attached to your back instead of inserted under the skin. A trial period is usually 3-5 days, but this can vary among health care providers. After your trial period, you and your health care provider will discuss whether a permanent spinal cord stimulator is an option for you. The permanent stimulator may be an option depending on:  Whether the stimulator reduces your pain during the trial.  Whether the stimulator fits into your lifestyle.  Whether the cost of the stimulator is covered by your insurance. What are the risks? Generally, a spinal cord stimulation trial is safe. However, problems can occur, including:  Bleeding or pain at the insertion site  of the leads.  Infection at the insertion site or around the leads.  Allergic reactions to medicines, devices, or dyes.  Damage to the skin, nerves, back muscles, or spinal cord where the leads are placed.  Inability to move the legs (paralysis).  Numbness in the legs.  Inability to control when you urinate or have a bowel movement (incontinence).  Spinal fluid leakage. How is a spinal cord stimulator placed for a trial?  For a trial period, the stimulator is placed on your skin, not under it. Only the leads that connect the stimulator to the spinal cord are implanted under your skin. The exact location of the stimulator depends on where you have pain. There are two types of surgery for implanting the leads:  Noninvasive surgery. In this type of surgery, a small incision is made and needles are used to place the leads under your skin.  Open surgery. In this type of surgery, a larger incision is made, and the leads are implanted directly into your back. How should I care for myself during the trial period? Activity  Return to your normal activities as told by your health care provider. Ask your health care provider what activities are safe for you.  Do not lift anything that is heavier than 10 lb (4.5 kg), or the limit that you are told. General instructions  Follow your health care provider's specific instructions about how to take care of your spinal cord stimulator and your incision.  Make sure you write down the following information so that you can share this information with   your health care provider: ? Your responses to the stimulator. Describe these as told by your health care provider. ? Your pain level throughout the day. ? The amount and kind of pain medicine that you take.  Take over-the-counter and prescription medicines only as told by your health care provider.  Do not take baths, swim, or use a hot tub until your health care provider approves.  Tell all health  care providers who provide care for you that you have a spinal cord stimulator. This is important information that could affect the medical treatment that you receive.  Keep all follow-up visits as told by your health care provider. This is important. When should I seek medical care? During the trial, seek medical care if:  You have redness, swelling, or pain around your incision.  You have fluid or blood coming from your incision.  Your incision feels warm to the touch.  You have pus or a bad smell coming from your incision.  The bandage (dressing) that covers your incision comes off. Get help right away if:  Your pain gets worse.  The stimulator leads come out.  You develop numbness or weakness in your legs, or you have difficulty walking.  You have problems urinating or having a bowel movement.  You have a fever.  You have symptoms that last for more than 2-3 days.  Your symptoms suddenly get worse. Summary  A spinal cord stimulator is a small device that sends electrical pulses to your spinal cord. This can relieve pain caused by many different health conditions.  Before a permanent stimulator is placed, you will have a trial using a temporary stimulator that is not implanted under your skin. This helps determine if a stimulator will reduce your pain.  For the trial, only the leads that connect the stimulator to the spinal cord are implanted under your skin.  During the trial period, make sure you write down information about your pain and your responses to the stimulator so that you can share this information with your health care provider.  Keep all follow-up visits as told by your health care provider. This is important. Contact your health care provider if you have symptoms that indicate a problem. This information is not intended to replace advice given to you by your health care provider. Make sure you discuss any questions you have with your health care  provider. Document Revised: 08/29/2019 Document Reviewed: 01/08/2019 [employer] Patient Education  2020 [employer]  

## 2020-01-20 ENCOUNTER — Ambulatory Visit (HOSPITAL_BASED_OUTPATIENT_CLINIC_OR_DEPARTMENT_OTHER): Payer: Medicare Other | Admitting: Pain Medicine

## 2020-01-20 ENCOUNTER — Ambulatory Visit
Admission: RE | Admit: 2020-01-20 | Discharge: 2020-01-20 | Disposition: A | Discharge status: Home / Self Care | Payer: Medicare Other | Source: Ambulatory Visit | Attending: Pain Medicine | Admitting: Pain Medicine

## 2020-01-20 ENCOUNTER — Other Ambulatory Visit: Payer: Self-pay

## 2020-01-20 ENCOUNTER — Encounter: Payer: Self-pay | Admitting: Pain Medicine

## 2020-01-20 VITALS — BP 97/70 | HR 98 | Temp 97.3°F | Resp 16 | Ht 63.0 in | Wt 155.0 lb

## 2020-01-20 DIAGNOSIS — M5441 Lumbago with sciatica, right side: Secondary | ICD-10-CM | POA: Insufficient documentation

## 2020-01-20 DIAGNOSIS — M79604 Pain in right leg: Secondary | ICD-10-CM | POA: Insufficient documentation

## 2020-01-20 DIAGNOSIS — G8929 Other chronic pain: Secondary | ICD-10-CM | POA: Diagnosis present

## 2020-01-20 DIAGNOSIS — Z91041 Radiographic dye allergy status: Secondary | ICD-10-CM

## 2020-01-20 DIAGNOSIS — G894 Chronic pain syndrome: Secondary | ICD-10-CM | POA: Diagnosis present

## 2020-01-20 DIAGNOSIS — M4727 Other spondylosis with radiculopathy, lumbosacral region: Secondary | ICD-10-CM | POA: Diagnosis present

## 2020-01-20 DIAGNOSIS — M5137 Other intervertebral disc degeneration, lumbosacral region: Secondary | ICD-10-CM | POA: Insufficient documentation

## 2020-01-20 DIAGNOSIS — M4317 Spondylolisthesis, lumbosacral region: Secondary | ICD-10-CM

## 2020-01-20 DIAGNOSIS — M5416 Radiculopathy, lumbar region: Secondary | ICD-10-CM | POA: Diagnosis present

## 2020-01-20 DIAGNOSIS — M79605 Pain in left leg: Secondary | ICD-10-CM | POA: Insufficient documentation

## 2020-01-20 MED ORDER — LIDOCAINE HCL 2 % IJ SOLN
20.0000 mL | Freq: Once | INTRAMUSCULAR | Status: AC
  Administered 2020-01-20: 400 mg

## 2020-01-20 MED ORDER — CLINDAMYCIN HCL 300 MG PO CAPS: 300.0000 mg | ORAL_CAPSULE | Freq: Three times a day (TID) | ORAL | 0 refills | Status: AC

## 2020-01-20 MED ORDER — VANCOMYCIN HCL 1500 MG/300ML IV SOLN
1500.0000 mg | Freq: Once | INTRAVENOUS | Status: AC
  Administered 2020-01-20: 1500 mg via INTRAVENOUS
  Filled 2020-01-20: qty 300

## 2020-01-20 MED ORDER — SODIUM CHLORIDE (PF) 0.9 % IJ SOLN
INTRAMUSCULAR | Status: AC
  Filled 2020-01-20: qty 10

## 2020-01-20 MED ORDER — LACTATED RINGERS IV SOLN
1000.0000 mL | Freq: Once | INTRAVENOUS | Status: AC
  Administered 2020-01-20: 08:00:00 1000 mL via INTRAVENOUS

## 2020-01-20 MED ORDER — MIDAZOLAM HCL 5 MG/5ML IJ SOLN
INTRAMUSCULAR | Status: AC
  Filled 2020-01-20: qty 5

## 2020-01-20 MED ORDER — ROPIVACAINE HCL 2 MG/ML IJ SOLN
INTRAMUSCULAR | Status: AC
  Filled 2020-01-20: qty 10

## 2020-01-20 MED ORDER — LIDOCAINE HCL 2 % IJ SOLN
INTRAMUSCULAR | Status: AC
  Filled 2020-01-20: qty 20

## 2020-01-20 MED ORDER — MIDAZOLAM HCL 5 MG/5ML IJ SOLN
1.0000 mg | INTRAMUSCULAR | Status: DC | PRN
  Administered 2020-01-20: 1 mg via INTRAVENOUS

## 2020-01-20 MED ORDER — SODIUM CHLORIDE 0.9% FLUSH
10.0000 mL | Freq: Once | INTRAVENOUS | Status: AC
  Administered 2020-01-20: 10 mL

## 2020-01-20 MED ORDER — ROPIVACAINE HCL 2 MG/ML IJ SOLN
20.0000 mL | Freq: Once | INTRAMUSCULAR | Status: AC
  Administered 2020-01-20: 20 mL
  Filled 2020-01-20: qty 20

## 2020-01-20 NOTE — Progress Notes (Signed)
Safety precautions to be maintained throughout the outpatient stay will include: orient to surroundings, keep bed in low position, maintain call bell within reach at all times, provide assistance with transfer out of bed and ambulation.  

## 2020-01-20 NOTE — Progress Notes (Signed)
PROVIDER NOTE: Information contained herein reflects review and annotations entered in association with encounter. Interpretation of such information and data should be left to medically-trained personnel. Information provided to patient can be located elsewhere in the medical record under "Patient Instructions". Document created using STT-dictation technology, any transcriptional errors that may result from process are unintentional.    Patient: Jean Gomez  Service Category: Procedure  Provider: Oswaldo Done, MD  DOB: 09/05/1956  DOS: 01/20/2020  Location: [facility]  MRN: [id number]  Setting: Ambulatory - outpatient  Referring Provider: Sherron Monday, MD  Type: Established Patient  Specialty: Interventional Pain Management  PCP: Sherron Monday, MD   Primary Reason for Admission: Surgical management of chronic pain condition.  Procedure:  Anesthesia, Analgesia, Anxiolysis:  Type: Trial Spinal Cord Neurostimulator Implant (Percutaneous, interlaminar, posterior epidural placement) Purpose: To determine if a permanent implant may be effective in controlling some or all of Jean Gomez's chronic pain symptoms.  Region: Lumbar Level: T12-L1 Laterality: Bilateral Paramedial  Type: Moderate (Conscious) Sedation combined with Local Anesthesia Indication(s): Analgesia and Anxiety Route: Intravenous (IV) IV Access: Secured Sedation: Meaningful verbal contact was maintained at all times during the procedure  Local Anesthetic: Lidocaine 1-2%   Indications: 1. Chronic pain syndrome   2. 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 Chronic lower extremity pain (Tertiary Area of Pain) (Bilateral) (R>L)   4. Chronic lumbar radiculopathy (Right)   5. Grade 1 Anterolisthesis of L5/S1   6. Lumbosacral spondylosis with radiculopathy (Right)   7. DDD (degenerative disc disease), lumbosacral   8. History of allergy to radiographic  contrast media    Pain Score: Pre-procedure: 9 /10 Post-procedure: 0-No pain/10   Pre-op Assessment:  Jean Gomez is a 64 y.o. (year old), female patient, seen today for interventional treatment. She  has a past surgical history that includes Cholecystectomy; Abdominal hysterectomy; Spinal fusion; Back surgery; Esophagogastroduodenoscopy (egd) with propofol (N/A, 08/19/2019); and Colonoscopy with propofol (N/A, 08/19/2019).  Initial Vital Signs:  Pulse/EKG Rate: (!) 121ECG Heart Rate: (!) 107 Temp: (!) 97.3 F (36.3 C) Resp: 18 BP: (!) 75/44 SpO2: 98 %(room air)  BMI: Estimated body mass index is 27.46 kg/m as calculated from the following:   Height as of this encounter: 5\' 3"  (1.6 m).   Weight as of this encounter: 155 lb (70.3 kg).  Risk Assessment: Allergies: Reviewed. She is allergic to contrast media [iodinated diagnostic agents]; gabapentin; labetalol; sulfabenzamide; fentanyl; penicillins; and pregabalin.  Allergy Precautions: None required Coagulopathies: Reviewed. None identified.  Blood-thinner therapy: None at this time Active Infection(s): Reviewed. None identified. Jean Gomez is afebrile  Site Confirmation: Jean Gomez was asked to confirm the procedure and laterality before marking the site, which she did. Procedure checklist: Completed Consent: Before the procedure and under the influence of no sedative(s), amnesic(s), or anxiolytics, the patient was informed of the treatment options, risks and possible complications. To fulfill our ethical and legal obligations, as recommended by the American Medical Association's Code of Ethics, I have informed the patient of my clinical impression; the nature and purpose of the treatment or procedure; the risks, benefits, and possible complications of the intervention; the alternatives, including doing nothing; the risk(s) and benefit(s) of the alternative treatment(s) or procedure(s); and the risk(s) and benefit(s) of doing nothing.  Ms.  Gomez was provided with information about the general risks and possible complications associated with most interventional procedures. These include, but are not limited to:  failure to achieve desired goals, infection, bleeding, organ or nerve damage, allergic reactions, paralysis, and/or death.  In addition, she was informed of those risks and possible complications associated to this particular procedure, which include, but are not limited to: damage to the implant; failure to decrease pain; local, systemic, or serious CNS infections, intraspinal abscess with possible cord compression and paralysis, or life-threatening such as meningitis; intrathecal and/or epidural bleeding with formation of hematoma with possible spinal cord compression and permanent paralysis; organ damage; nerve injury or damage with subsequent sensory, motor, and/or autonomic system dysfunction, resulting in transient or permanent pain, numbness, and/or weakness of one or several areas of the body; allergic reactions, either minor or major life-threatening, such as anaphylactic or anaphylactoid reactions.  Furthermore, Jean Gomez was informed of those risks and complications associated with the medications. These include, but are not limited to: allergic reactions (i.e.: anaphylactic or anaphylactoid reactions); arrhythmia;  Hypotension/hypertension; cardiovascular collapse; respiratory depression and/or shortness of breath; swelling or edema; medication-induced neural toxicity; particulate matter embolism and blood vessel occlusion with resultant organ, and/or nervous system infarction and permanent paralysis.  Finally, she was informed that Medicine is not an exact science; therefore, there is also the possibility of unforeseen or unpredictable risks and/or possible complications that may result in a catastrophic outcome. The patient indicated having understood very clearly. We have given the patient no guarantees and we have made no  promises. Enough time was given to the patient to ask questions, all of which were answered to the patient's satisfaction. Jean Gomez has indicated that she wanted to continue with the procedure. Attestation: I, the ordering provider, attest that I have discussed with the patient the benefits, risks, side-effects, alternatives, likelihood of achieving goals, and potential problems during recovery for the procedure that I have provided informed consent. Date   Time: 01/20/2020  8:03 AM  Pre-Procedure Preparation:  Monitoring: As per clinic protocol. Respiration, ETCO2, SpO2, BP, heart rate and rhythm monitor placed and checked for adequate function Safety Precautions: Patient was assessed for positional comfort and pressure points before starting the procedure. Time-out: I initiated and conducted the "Time-out" before starting the procedure, as per protocol. The patient was asked to participate by confirming the accuracy of the "Time Out" information. Verification of the correct person, site, and procedure were performed and confirmed by me, the nursing staff, and the patient. "Time-out" conducted as per Joint Commission's Universal Protocol (UP.01.01.01). Time: 0854  Description of Procedure Process:   Position: Prone Target Area: Posterior epidural space Approach: Posterior percutaneous, paramedial, interlaminar approach Area Prepped: Bilateral thoraco-lumbar Region Prepping solution: ChloraPrep (2% chlorhexidine gluconate and 70% isopropyl alcohol) Safety Precautions: Safe injection practices and needle disposal techniques used. Medications properly checked for expiration dates. SDV (single dose vial) medications used. Aspiration looking for blood return and/or CSF was conducted prior to all injections. At no point did I inject any substances, as a needle was being advanced. No attempts were made at seeking any paresthesias.  Description of the Procedure: Availability of a responsible, adult driver,  and NPO status confirmed. Informed consent was obtained after having discussed risks and possible complications. An IV was started. The patient was then taken to the fluoroscopy suite, where the patient was placed in position for the procedure, over the fluoroscopy table. The patient was then monitored in the usual manner. Fluoroscopy was manipulated to obtain the best possible view of the target. Parallex error was corrected before commencing the procedure. Once a clear view of the target  had been obtained, the skin and deeper tissues over the procedure site were infiltrated using lidocaine, loaded in a 10 cc luer-loc syringe with a 0.5 inch, 25-G needle. The introducer needle(s) was/were then inserted through the skin and deeper tissues. A paramidline approach was used to enter the posterior epidural space at a 30 angle, using Loss-of-resistance Technique with 3 ml of PF-NaCl (0.9% NSS). Correct needle placement was confirmed in the antero-posterior and lateral fluoroscopic views. The lead was gently introduced and manipulated under real-time fluoroscopy, constantly assessing for pain, discomfort, or paresthesias, until the tip rested at the desired level. Both sides were done in identical fashion. Electrode placement was tested until appropriate coverage was attained. Once the patient confirmed that the stimulation was over the desired area, the lead(s) was/were secured in place and the introducer needles removed. This was done under real-time fluoroscopy while observing the electrode tip to avoid unintended migration. The area was covered with a non-occlusive dressing and the patient transported to recovery for further programming.  Vitals:   01/20/20 0937 01/20/20 0947 01/20/20 0957 01/20/20 1007  BP: 93/70 (!) 121/99 109/77 97/70  Pulse:    98  Resp: 15 16 15 16   Temp:      TempSrc:      SpO2: 100% 100% 98% 99%  Weight:      Height:       Start Time: 0854 hrs. End Time: 0935  hrs.  Neurostimulator Details:   Lead(s):  Brand: Medtronic Epidural Access Level:  T12-L1 T12-L1  Lead implant:  Bilateral   No. of Electrodes/Lead:  8 8  Laterality:  Left Right  Top electrode location:  T7 T8  Bottom electrode location:  T9 T10  Model No.: [id number] Same  Length: 60 cm Same  Lot No.: [id number]  MRI compatibility:  Yes Yes  External Neurostimulator    Model No.: [id number]   Serial No.: [id number]    Imaging Guidance (Spinal):          Type of Imaging Technique: Fluoroscopy Guidance (Spinal) Indication(s): Assistance in needle guidance and placement for procedures requiring needle placement in or near specific anatomical locations not easily accessible without such assistance. Exposure Time: Please see nurses notes. Contrast: None used. Fluoroscopic Guidance: I was personally present during the use of fluoroscopy. "Tunnel Vision Technique" used to obtain the best possible view of the target area. Parallax error corrected before commencing the procedure. "Direction-depth-direction" technique used to introduce the needle under continuous pulsed fluoroscopy. Once target was reached, antero-posterior, oblique, and lateral fluoroscopic projection used confirm needle placement in all planes. Images permanently stored in EMR.            Interpretation: No contrast injected. I personally interpreted the imaging intraoperatively. Adequate needle placement confirmed in multiple planes. Permanent images saved into the patient's record.  Antibiotic Prophylaxis:   Anti-infectives (From admission, onward)   Start     Dose/Rate Route Frequency Ordered Stop   01/20/20 0815  vancomycin (VANCOREADY) IVPB 1500 mg/300 mL     1,500 mg 150 mL/hr over 120 Minutes Intravenous  Once 01/20/20 0811 01/20/20 1020   01/20/20 0000  clindamycin (CLEOCIN) 300 MG capsule     300 mg Oral 3 times daily 01/20/20 4128 01/27/20 2359     Indication(s): Surgical  Prophylaxis.  Post-operative Assessment:  Post-procedure Vital Signs:  Pulse/HCG Rate: 9894 Temp: (!) 97.3 F (36.3 C) Resp: 16 BP: 97/70 SpO2: 99 %  Complications: No immediate post-treatment complications observed by team, or reported  by patient.  Note: The patient tolerated the entire procedure well. A repeat set of vitals were taken after the procedure and the patient was kept under observation following institutional policy, for this type of procedure. Post-procedural neurological assessment was performed, showing return to baseline, prior to discharge. The patient was provided with post-procedure discharge instructions, including a section on how to identify potential problems. Should any problems arise concerning this procedure, the patient was given instructions to immediately contact us, at any time, without hesitation. In any case, we plan to contact the patient by telephone for a follow-up status report regarding this interventional procedure.  Comments:  No additional relevant information.  Plan of Care  Orders:  Orders Placed This Encounter  Procedures   [place] TRIAL    Contact medical implant company representative to make sure they are available to provide required equipment.    Scheduling Instructions:     Side: Bilateral     Level: Lumbar     Device: Medtronic     Sedation: With sedation     Timeframe: Today    Order Specific Question:   Where will this procedure be performed?    Answer:   [facility]   DG [facility] C-ARM 1-60 MIN NO REPORT    Intraoperative interpretation by procedural physician at [facility].    Standing Status:   Standing    Number of Occurrences:   1    Order Specific Question:   Reason for exam:    Answer:   Assistance in needle guidance and placement for procedures requiring needle placement in or near specific anatomical locations not easily accessible without such assistance.   Informed Consent Details:  Physician/Practitioner Attestation; Transcribe to consent form and obtain patient signature    Provider Attestation: I, [place] Nordquist A. Laban EmperorNaveira, MD, (Pain Management Specialist), the physician/practitioner, attest that I have discussed with the patient the benefits, risks, side effects, alternatives, likelihood of achieving goals and potential problems during recovery for the procedure that I have provided informed consent.    Scheduling Instructions:     Procedure: Neurostimulator Trial     Indication/Reason: Regional Chronic Pain Syndrome     Note: Always confirm laterality of pain with [name], before procedure.     Transcribe to consent form and obtain patient signature.   Provide equipment / supplies at bedside    Epidural Tray (x1) Sterile towel pack (x2) Small streight hemostat (x2) Large hemostat (x1) Sutures (cutting needle) 0-silk Needle holder (x1) Scissors (Mayo) (x1) 3/4" Steri-strip pack (x2) 4x4 Gauze Pack (x1) Large and Medium size sterile transparent dressings    Standing Status:   Standing    Number of Occurrences:   1    Order Specific Question:   Specify    Answer:   Epidural Tray & Minor Surgery Tray   Miscellanous precautions    Standing Status:   Standing    Number of Occurrences:   1   Chronic Opioid Analgesic:  Oxycodone IR 10 mg 1 tab PO q8 hrs (30 mg/day of oxycodone) MME/day:45 MME/day.   Medications administered: We administered lidocaine, lactated ringers, midazolam, ropivacaine (PF) 2 mg/mL (0.2%), sodium chloride flush, and vancomycin HCl.  See the medical record for exact dosing, route, and time of administration.  Follow-up plan:   Return in about 6 days (around 01/26/2020) for (F2F), Procedure (no sedation): Removal of trial leads.       Interventional treatment options:  Under consideration: NOTE: CONTRAST & FENTANYL Allergy. Diagnostic  left L4 TFESI #1 Diagnostic/therapeutic right L5-S1 IA synovial cyst facet joint injection #1   Diagnostic bilateral L5 TFESI #1  Possible bilateral occipital nerve RFA Possible bilateral occipital nerve peripheral nerves stimulator trial Diagnostic bilateral cervical facet block Possible bilateral cervical facet RFA Diagnostic right CESI Diagnostic bilateral suprascapular NB Possible bilateral suprascapular nerve RFA Diagnostic right LESI Diagnostic left IA hip joint injection Diagnostic bilateral femoral + obturator NB Possible bilateral femoral + obturator nerve RFA Diagnostic bilateral genicular NB Possible bilateral genicular RFA   Therapeutic/palliative (PRN): Diagnostic midline caudal ESI #2 (100/90/40/<50)  Diagnostic right IA hip joint injection #2  Diagnostic right trochanteric bursa injection #2  Diagnostic bilateral IA shoulder joint injection #2 (100/100/50/75)  Diagnostic right L5 TFESI #2 (100/100/97/>50)  Diagnostic left L4-5 LESI #2 (100/100/97/>50)  Palliative bilateral lumbar facet blocks #4 (100/100/90/50)  Palliative right lumbar facet RFA #2 (last done 01/16/2019) Palliative left lumbar facet RFA #2 (last done 01/30/2019) Diagnostic bilateral GONB#2(100/0/25/50)  Palliative bilateral IA knee joint injection (w/ steroid)  Palliative bilateral IA Hyalgan knee injections(series#2) (series#1completed on 07/04/2018)     Recent Visits Date Type Provider Dept  01/05/20 Office Visit Delano Metz, MD [facility]  10/29/19 Telemedicine Delano Metz, MD [facility]  Showing recent visits within past 90 days and meeting all other requirements   Today's Visits Date Type Provider Dept  01/20/20 Procedure visit Delano Metz, MD [facility]  Showing today's visits and meeting all other requirements   Future Appointments Date Type Provider Dept  01/27/20 Appointment Delano Metz, MD [facility]  03/29/20 Appointment Delano Metz, MD [facility]  Showing future  appointments within next 90 days and meeting all other requirements   Disposition: Discharge home  Discharge (Date   Time): 01/20/2020; 1013 hrs.   Primary Care Physician: Sherron Monday, MD Location: [facility] Note by: Oswaldo Done, MD Date: 01/20/2020; Time: 10:36 AM

## 2020-01-20 NOTE — Patient Instructions (Signed)
___________________________________________________________________________________________ ? ?PATIENT DISCHARGE INSTRUCTIONS FOLLOWING SPINAL CORD STIMULATOR TRIAL IMPLANT ? ?You will be discharged from the clinic on the same day as your surgery, after you are fully recovered. Make ?arrangements to be driven home. DO NOT DRIVE YOURSELF!! ? ?The bandage over the side incision may drain a small amount of blood. This is normal; don?t be alarmed. ?Be certain to review all warnings/precautions in the patient brochure accompanying your stimulator. ?Please call the Pain Clinic to make an appointment for follow?up care. ? ?Instructions: ?Food intake: Start with clear liquids (like water) and advance to regular food, as tolerated.  ?Physical activities:  ?No restrictions during the trial period except for submerging in water above the implant area. ?Driving: You can start to drive again when you are fully recovered from the effects of the sedation (24 hours). ?Blood thinner: If you take a blood thinner, restart it 6 hours after your procedure. (Only for those taking blood thinners) ?Insulin: If you use insulin, as soon as you can eat, you may resume your normal dosing schedule. (Only for those taking insulin) ?Wound dressing care: Keep procedure site clean and dry. Clean wound with alcohol, once a day for the first 7 days, starting 48 hours after the surgery.  ?Keep incisions clean and dry. Cover with plastic wrap and tape when showering. Do not submerge incisions in a bathtub, pool or spa. ?Follow-up appointment: Keep your follow-up appointment after the procedure. Usually 6-7 days to remove trial leads.  ? ?Expect: ?From numbing medicine (AKA: Local Anesthetics): Numbness or decrease in pain. ?Duration: On the average, 1 to 8 hours.  ?From procedure: Some discomfort is to be expected once the numbing medicine wears off. This should be minimal. Use your regular pain medicines.  ? ?Call if: ?You experience numbness and  weakness that gets worse with time, as opposed to wearing off. ?New onset bowel or bladder incontinence. ?develop a temperature over 101.5? ?are unable to urinate ?have increasing difficulty walking or using your legs ?have incisional pain that continues to increase rather than stabilize or improve ?have any drainage of pus from your incisions ?have drainage which completely saturates the bandage ? ?Emergency Numbers: ?Durning business hours (Monday - Thursday, 8:00 AM - 4:00 PM) (Friday, 9:00 AM - 12:00 Noon): [phone] ?After hours: [phone] ?____________________________________________________________________________________________ ?  ?

## 2020-01-21 ENCOUNTER — Telehealth: Payer: Self-pay

## 2020-01-21 NOTE — Telephone Encounter (Signed)
Post procedure phone call.  Call can not be completed at this time.  °

## 2020-01-22 ENCOUNTER — Ambulatory Visit: Payer: Medicare Other | Admitting: Psychology

## 2020-01-26 ENCOUNTER — Ambulatory Visit: Payer: Medicare Other | Admitting: Pain Medicine

## 2020-01-27 ENCOUNTER — Other Ambulatory Visit: Payer: Self-pay

## 2020-01-27 ENCOUNTER — Ambulatory Visit: Payer: Medicare Other | Attending: Pain Medicine | Admitting: Pain Medicine

## 2020-01-27 ENCOUNTER — Encounter: Payer: Self-pay | Admitting: Pain Medicine

## 2020-01-27 VITALS — BP 116/89 | HR 109 | Temp 97.1°F | Resp 16 | Ht 63.0 in | Wt 155.0 lb

## 2020-01-27 DIAGNOSIS — Z888 Allergy status to other drugs, medicaments and biological substances status: Secondary | ICD-10-CM | POA: Diagnosis present

## 2020-01-27 DIAGNOSIS — M5416 Radiculopathy, lumbar region: Secondary | ICD-10-CM | POA: Insufficient documentation

## 2020-01-27 DIAGNOSIS — M79605 Pain in left leg: Secondary | ICD-10-CM | POA: Diagnosis present

## 2020-01-27 DIAGNOSIS — G894 Chronic pain syndrome: Secondary | ICD-10-CM

## 2020-01-27 DIAGNOSIS — M79604 Pain in right leg: Secondary | ICD-10-CM | POA: Insufficient documentation

## 2020-01-27 DIAGNOSIS — G8929 Other chronic pain: Secondary | ICD-10-CM | POA: Insufficient documentation

## 2020-01-27 DIAGNOSIS — M4317 Spondylolisthesis, lumbosacral region: Secondary | ICD-10-CM | POA: Diagnosis present

## 2020-01-27 DIAGNOSIS — M25561 Pain in right knee: Secondary | ICD-10-CM | POA: Diagnosis present

## 2020-01-27 DIAGNOSIS — M25562 Pain in left knee: Secondary | ICD-10-CM | POA: Diagnosis present

## 2020-01-27 DIAGNOSIS — M5441 Lumbago with sciatica, right side: Secondary | ICD-10-CM | POA: Diagnosis present

## 2020-01-27 DIAGNOSIS — M5137 Other intervertebral disc degeneration, lumbosacral region: Secondary | ICD-10-CM | POA: Insufficient documentation

## 2020-01-27 NOTE — Progress Notes (Signed)
PROVIDER NOTE: Information contained herein reflects review and annotations entered in association with encounter. Interpretation of such information and data should be left to medically-trained personnel. Information provided to patient can be located elsewhere in the medical record under "Patient Instructions". Document created using STT-dictation technology, any transcriptional errors that may result from process are unintentional.    Patient: Jean Gomez  Service Category: Procedure  Provider: Gaspar Cola, MD  DOB: [birth year]  DOS: 01/27/2020  Location: [facility]  MRN: [id number]  Setting: Ambulatory - outpatient  Referring Provider: Jodi Marble, MD  Type: Established Patient  Specialty: Interventional Pain Management  PCP: Jodi Marble, MD   Primary Reason for Admission: Surgical management of chronic pain condition.  Procedure:  Anesthesia, Analgesia, Anxiolysis:  Type: Removal of Trial Neurostimulator Leads Purpose: End-of-trial Region: Lumbar Laterality: Bilateral   None required   Indications: 1. Chronic pain syndrome   2. 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 Chronic lower extremity pain (Tertiary Area of Pain) (Bilateral) (R>L)   4. Chronic lumbar radiculopathy (Right)   5. DDD (degenerative disc disease), lumbosacral   6. Grade 1 Anterolisthesis of L5/S1   7. 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  It did not really control the pain and she did not like the tingling sensation that the stimulator was providing her.  During the entire week, she had the backup from the [employer] representative and they went through multiple reprogramming encounters and despite our best efforts, she never  obtained the relief that she was expecting.  In reviewing her expectations, they were realistic.  She was very glad that there was a trial period for this as the device did perform while in a neighbor gave her any problems, but simply it was not what she was hoping for or looking for.  Because of this, today we will be removing the trial device and we will be looking at other possible options.  For now, she refers that she is doing okay and she would like for [place] to go ahead and schedule another series of Hyalgan knee injections for her.  The last series was completed in 2019 with good results until recently when the pain started to again get worse.  Since she had such good results with the first series, we will go ahead and schedule her for a second series.  Neuromodulation Implant Therapy Assessment  Epidural Neurostimulator implant: Side-effects or Adverse reactions: None reported Effectiveness: Described as ineffective. Plan: No plan to proceed with permanent implant.  Pre-op Assessment:  Jean Gomez is a 64 y.o. (year old), female patient, seen today for removal of neurostimulator trial lead(s). She  has a past surgical history that includes Cholecystectomy; Abdominal hysterectomy; Spinal fusion; Back surgery; Esophagogastroduodenoscopy (egd) with propofol (N/A, 08/19/2019); and Colonoscopy with propofol (N/A, 08/19/2019).  Initial Vital Signs:  Pulse: (!) 109  Temp: (!) 97.1 F (36.2 C) Resp: 16 BP: 116/89 SpO2: 99 %  BMI: Estimated body mass index is 27.46 kg/m as calculated from the following:   Height as of this encounter: 5' 3" (1.6 m).   Weight as of this encounter: 155 lb (70.3 kg).  Risk Assessment: Allergies: Reviewed. She is allergic to contrast  media [iodinated diagnostic agents]; gabapentin; labetalol; sulfabenzamide; fentanyl; penicillins; and pregabalin.  Allergy Precautions: None required Coagulopathies: Reviewed. None identified.  Blood-thinner therapy: None at this  time Active Infection(s): Reviewed. None identified. Jean Gomez is afebrile  Site Confirmation: Jean Gomez was asked to confirm the procedure and laterality before marking the site, which she did. Procedure checklist: Completed Consent: Jean Gomez consents to removal of trial leads. Attestation: I, the ordering provider, attest that I have discussed with the patient the risks and potential problems associated with procedure. Date  Time: 01/27/2020 11:23 AM  Pre-Procedure Preparation:  Monitoring: As per clinic protocol. Respiration, ETCO2, SpO2, BP, heart rate and rhythm monitor placed and checked for adequate function Safety Precautions: Patient was assessed for positional comfort and pressure points before starting the procedure. Time-out: I initiated and conducted the "Time-out" before starting the procedure, as per protocol. The patient was asked to participate by confirming the accuracy of the "Time Out" information. Verification of the correct person, site, and procedure were performed and confirmed by me, the nursing staff, and the patient. "Time-out" conducted as per Joint Commission's Universal Protocol (UP.01.01.01). Time: 1212  Description of Procedure Process:   Position: Sitting Area Prepped: Around implant device site Prepping solution: ChloraPrep (2% chlorhexidine gluconate and 70% isopropyl alcohol) Safety Precautions: Sterile technique used  Description of the Procedure: Availability of a responsible, adult driver, and NPO status confirmed. Informed consent was obtained after having discussed risks and possible complications. An IV was started. The patient was then taken to the fluoroscopy suite, where the patient was placed in position for the procedure, over the fluoroscopy table. The patient was then monitored in the usual manner. The bandages were removed and the surgical area was prepped using a broad-spectrum topical antiseptic. Meaningful verbal contact was maintained with  the patient at all times. The stitches were then removed and the patient asked to mildly flex the spine. Using constant tension over the leads, the electrodes were removed in their entirety, without any apparent complication. The patient tolerated the entire procedure well. Following the performance of this procedure, the patient was kept under observation until the discharge criteria were met. The patient was sent home in stable condition. The patient was provided with discharge instructions, including a section on how to identify potential problems. Should any problems arise concerning this procedure, the patient was given instructions to contact us, without hesitation. In any case, we will contact the patient by telephone for a follow-up status report regarding this interventional procedure.  Vitals:   01/27/20 1119  BP: 116/89  Pulse: (!) 109  Resp: 16  Temp: (!) 97.1 F (36.2 C)  TempSrc: Temporal  SpO2: 99%  Weight: 155 lb (70.3 kg)  Height: 5' 3" (1.6 m)   Start Time: 1212 hrs. End Time: 1219(site cleaned and bandaid  applied.) hrs.  Materials: Sterile suture removal kit; Sterile gloves; Sterile gauze; Band-aid  Medication(s): N/A  Post-operative Assessment:  Post-procedure Vital Signs:  Pulse/HCG Rate: (!) 109  Temp: (!) 97.1 F (36.2 C) Resp: 16 BP: 116/89 SpO2: 99 %  Complications: No immediate post-treatment complications observed by team, or reported by patient.  Note: The patient tolerated the entire procedure well. A repeat set of vitals were taken after the procedure and the patient was kept under observation following institutional policy, for this type of procedure. Post-procedural neurological assessment was performed, showing return to baseline, prior to discharge. The patient was provided with post-procedure discharge instructions, including a section on how to identify  potential problems. Should any problems arise concerning this procedure, the patient was given  instructions to immediately contact us, at any time, without hesitation. In any case, we plan to contact the patient by telephone for a follow-up status report regarding this interventional procedure.  Comments:  No additional relevant information.  Plan of Care  Orders:  Orders Placed This Encounter  Procedures  . KNEE INJECTION    Hyalgan knee injection. Please order Hyalgan.    Standing Status:   Future    Standing Expiration Date:   02/24/2020    Scheduling Instructions:     Procedure: Intra-articular Hyalgan Knee injection #1     Side: Bilateral     Sedation: None     Timeframe: ASAA    Order Specific Question:   Where will this procedure be performed?    Answer:   [facility]  . Lumbar spinal cord simulator lead removal    Have suture removal kit available in room. Other equipment: Alcohol Prep; sterile gloves; sterile scissors (suture removal kit); Band-Aid; 4x4 gauze.    Scheduling Instructions:     Procedure: Lumbar spinal cord simulator lead removal     Laterality: N/A     Sedation: No Sedation     Timeframe:  Today  . LEAD REMOVAL    Have suture removal kit available in room. Other equipment: Alcohol Prep; sterile gloves; sterile scissors (suture removal kit); Band-Aid; 4x4 gauze.    Scheduling Instructions:     Procedure: Neurostimulator lead removal     Laterality: N/A     Sedation: No Sedation     Timeframe:  Today  . Miscellanous precautions    NOTE: Although It is true that patients can have allergies to shellfish and that shellfish contain iodine, most shellfish  allergies are due to two protein allergens present in the shellfish: tropomyosins and parvalbumin. Not all patients with shellfish allergies are allergic to iodine. However, as a precaution, avoid using iodine containing products.    Standing Status:   Standing    Number of Occurrences:   1   Chronic Opioid Analgesic:  Oxycodone IR 10 mg 1 tab PO q8 hrs (30 mg/day of oxycodone) MME/day:45  MME/day.   Medications administered: Jean Gomez had no medications administered during this visit.  See the medical record for exact dosing, route, and time of administration.  Follow-up plan:   Return for Procedure (no sedation): (B) Hyalgan Knee inj #1, (ASAP).       Interventional treatment options:  Under consideration: NOTE: CONTRAST & FENTANYL Allergy. Diagnostic left L4 TFESI #1 Diagnostic/therapeutic right L5-S1 IA synovial cyst facet joint injection #1  Diagnostic bilateral L5 TFESI #1  Possible bilateral occipital nerve RFA Possible bilateral occipital nerve peripheral nerves stimulator trial Diagnostic bilateral cervical facet block Possible bilateral cervical facet RFA Diagnostic right CESI Diagnostic bilateral suprascapular NB Possible bilateral suprascapular nerve RFA Diagnostic right LESI Diagnostic left IA hip joint injection Diagnostic bilateral femoral + obturator NB Possible bilateral femoral + obturator nerve RFA Diagnostic bilateral genicular NB Possible bilateral genicular RFA   Therapeutic/palliative (PRN): Diagnostic midline caudal ESI #2 (100/90/40/<50)  Diagnostic right IA hip joint injection #2  Diagnostic right trochanteric bursa injection #2  Diagnostic bilateral IA shoulder joint injection #2 (100/100/50/75)  Diagnostic right L5 TFESI #2 (100/100/97/>50)  Diagnostic left L4-5 LESI #2 (100/100/97/>50)  Palliative bilateral lumbar facet blocks #4 (100/100/90/50)  Palliative right lumbar facet RFA #2 (last done 01/16/2019) Palliative left lumbar facet RFA #2 (last done 01/30/2019)   Diagnostic bilateral GONB#2(100/0/25/50)  Palliative bilateral IA knee joint injection (w/ steroid)  Palliative bilateral IA Hyalgan knee injections(series#2) (series#1completed on 07/04/2018)     Recent Visits Date Type Provider Dept  01/20/20 Procedure visit , , MD [facility]  01/05/20 Office Visit ,  , MD [facility]  10/29/19 Telemedicine , , MD [facility]  Showing recent visits within past 90 days and meeting all other requirements   Today's Visits Date Type Provider Dept  01/27/20 Procedure visit , , MD [facility]  Showing today's visits and meeting all other requirements   Future Appointments Date Type Provider Dept  03/29/20 Appointment , , MD [facility]  Showing future appointments within next 90 days and meeting all other requirements   Disposition: Discharge home  Discharge (Date  Time): 01/27/2020; 1220 hrs.   Primary Care Physician: Tejan-[name], S Ahmed, MD Location: [facility] Note by:  A , MD Date: 01/27/2020; Time: 1:00 PM 

## 2020-01-27 NOTE — Progress Notes (Signed)
Safety precautions to be maintained throughout the outpatient stay will include: orient to surroundings, keep bed in low position, maintain call bell within reach at all times, provide assistance with transfer out of bed and ambulation.  

## 2020-01-27 NOTE — Patient Instructions (Signed)
____________________________________________________________________________________________  Preparing for your procedure (without sedation)  Procedure appointments are limited to planned procedures: . No Prescription Refills. . No disability issues will be discussed. . No medication changes will be discussed.  Instructions: . Oral Intake: Do not eat or drink anything for at least 3 hours prior to your procedure. . Transportation: Unless otherwise stated by your physician, you may drive yourself after the procedure. . Blood Pressure Medicine: Take your blood pressure medicine with a sip of water the morning of the procedure. . Blood thinners: Notify our staff if you are taking any blood thinners. Depending on which one you take, there will be specific instructions on how and when to stop it. . Diabetics on insulin: Notify the staff so that you can be scheduled 1st case in the morning. If your diabetes requires high dose insulin, take only  of your normal insulin dose the morning of the procedure and notify the staff that you have done so. . Preventing infections: Shower with an antibacterial soap the morning of your procedure.  . Build-up your immune system: Take 1000 mg of Vitamin C with every meal (3 times a day) the day prior to your procedure. . Antibiotics: Inform the staff if you have a condition or reason that requires you to take antibiotics before dental procedures. . Pregnancy: If you are pregnant, call and cancel the procedure. . Sickness: If you have a cold, fever, or any active infections, call and cancel the procedure. . Arrival: You must be in the facility at least 30 minutes prior to your scheduled procedure. . Children: Do not bring any children with you. . Dress appropriately: Bring dark clothing that you would not mind if they get stained. . Valuables: Do not bring any jewelry or valuables.  Reasons to call and reschedule or cancel your procedure: (Following these  recommendations will minimize the risk of a serious complication.) . Surgeries: Avoid having procedures within 2 weeks of any surgery. (Avoid for 2 weeks before or after any surgery). . Flu Shots: Avoid having procedures within 2 weeks of a flu shots or . (Avoid for 2 weeks before or after immunizations). . Barium: Avoid having a procedure within 7-10 days after having had a radiological study involving the use of radiological contrast. (Myelograms, Barium swallow or enema study). . Heart attacks: Avoid any elective procedures or surgeries for the initial 6 months after a "Myocardial Infarction" (Heart Attack). . Blood thinners: It is imperative that you stop these medications before procedures. Let us know if you if you take any blood thinner.  . Infection: Avoid procedures during or within two weeks of an infection (including chest colds or gastrointestinal problems). Symptoms associated with infections include: Localized redness, fever, chills, night sweats or profuse sweating, burning sensation when voiding, cough, congestion, stuffiness, runny nose, sore throat, diarrhea, nausea, vomiting, cold or Flu symptoms, recent or current infections. It is specially important if the infection is over the area that we intend to treat. . Heart and lung problems: Symptoms that may suggest an active cardiopulmonary problem include: cough, chest pain, breathing difficulties or shortness of breath, dizziness, ankle swelling, uncontrolled high or unusually low blood pressure, and/or palpitations. If you are experiencing any of these symptoms, cancel your procedure and contact your primary care physician for an evaluation.  Remember:  Regular Business hours are:  Monday to Thursday 8:00 AM to 4:00 PM  Provider's Schedule: Ann Bohne, MD:  Procedure days: Tuesday and Thursday 7:30 AM to 4:00 PM  Bilal   Lateef, MD:  Procedure days: Monday and Wednesday 7:30 AM to 4:00  PM ____________________________________________________________________________________________    

## 2020-01-28 ENCOUNTER — Telehealth: Payer: Self-pay | Admitting: *Deleted

## 2020-03-01 ENCOUNTER — Telehealth: Payer: Medicare Other | Admitting: Pain Medicine

## 2020-03-23 ENCOUNTER — Encounter: Payer: Self-pay | Admitting: Pain Medicine

## 2020-03-23 NOTE — Progress Notes (Signed)
Patient: Jean Gomez  Service Category: E/M  Provider: Gaspar Cola, MD  DOB: 12-29-1955  DOS: 03/24/2020  Location: Office  MRN: [id number]  Setting: Ambulatory outpatient  Referring Provider: Jodi Marble, MD  Type: Established Patient  Specialty: Interventional Pain Management  PCP: Jodi Marble, MD  Location: Remote location  Delivery: TeleHealth     Virtual Encounter - Pain Management PROVIDER NOTE: Information contained herein reflects review and annotations entered in association with encounter. Interpretation of such information and data should be left to medically-trained personnel. Information provided to patient can be located elsewhere in the medical record under "Patient Instructions". Document created using STT-dictation technology, any transcriptional errors that may result from process are unintentional.    Contact & Pharmacy Preferred: [phone] Home: [phone] (home) Mobile: [phone] (mobile) E-mail: No e-mail address on record  [place], [place] - [place] [address] Phone: [phone] Fax: [phone]   Pre-screening  Ms. Owens Shark offered "in-person" vs "virtual" encounter. She indicated preferring virtual for this encounter.   Reason COVID-19*  Social distancing based on CDC and AMA recommendations.   I contacted Yevonne Pax on 03/24/2020 via telephone.      I clearly identified myself as Gaspar Cola, MD. I verified that I was speaking with the correct person using two identifiers (Name: DELL HURTUBISE, and date of birth: 1956-08-01).  Consent I sought verbal advanced consent from Yevonne Pax for virtual visit interactions. I informed Ms. Greenberger of possible security and privacy concerns, risks, and limitations associated with providing "not-in-person" medical evaluation and management services. I also informed Ms. Evitt of the availability of "in-person" appointments.  Finally, I informed her that there would be a charge for the virtual visit and that she could be  personally, fully or partially, financially responsible for it. Ms. Moffet expressed understanding and agreed to proceed.   Historic Elements   Ms. ASIANAE MINKLER is a 64 y.o. year old, female patient evaluated today after her last contact with our practice on 01/28/2020. Ms. Salvador  has a past medical history of Acute postoperative pain (01/16/2019), Allergy, Asthma, Cerebral aneurysm, Chest pain (11/01/2016), Chronic migraine, Chronic respiratory failure ([place]), COPD (chronic obstructive pulmonary disease) ([place]), Depression, Hypertension, Osteoarthritis, Pneumonia, Sarcoid, and Seizures ([place]). She also  has a past surgical history that includes Cholecystectomy; Abdominal hysterectomy; Spinal fusion; Back surgery; Esophagogastroduodenoscopy (egd) with propofol (N/A, 08/19/2019); and Colonoscopy with propofol (N/A, 08/19/2019). Ms. Mallinger has a current medication list which includes the following prescription(s): albuterol, albuterol sulfate, alprazolam, amlodipine, aspirin ec, atorvastatin, betamethasone (augmented), budesonide-formoterol, calcium carbonate, citalopram, clotrimazole-betamethasone, dexilant, [START ON 03/29/2020] diclofenac sodium, diphenhydramine, doxycycline, medical compression stockings, hydrochlorothiazide, hydroxyzine, ipratropium, levetiracetam, [START ON 03/29/2020] lidocaine, linaclotide, losartan, nitroglycerin, [START ON 03/31/2020] oxycodone hcl, [START ON 04/30/2020] oxycodone hcl, [START ON 05/30/2020] oxycodone hcl, pantoprazole, potassium chloride sa, promethazine, quetiapine, sucralfate, [START ON 03/29/2020] tizanidine, torsemide, trazodone, and venlafaxine. She  reports that she has never smoked. She has never used smokeless tobacco. She reports current alcohol use. She reports that she does not use drugs. Ms. Stlouis is allergic to contrast media [iodinated diagnostic agents]; gabapentin;  labetalol; sulfabenzamide; penicillins; and pregabalin.   HPI  Today, she is being contacted for medication management.  Pharmacotherapy Assessment  Analgesic: Oxycodone IR 10 mg 1 tab PO q8 hrs (30 mg/day of oxycodone) MME/day:45 MME/day.   Monitoring: [place] PMP: PDMP reviewed during this encounter.       Pharmacotherapy:  No side-effects or adverse reactions reported. Compliance: No problems identified. Effectiveness: Clinically acceptable. Plan: Refer to "POC".  UDS:  Summary  Date Value Ref Range Status  03/27/2018 FINAL  Final    Comment:    ==================================================================== TOXASSURE SELECT 13 (MW) ==================================================================== Test                             Result       Flag       Units Drug Present and Declared for Prescription Verification   Hydrocodone                    646          EXPECTED   ng/mg creat   Hydromorphone                  367          EXPECTED   ng/mg creat   Dihydrocodeine                 317          EXPECTED   ng/mg creat   Norhydrocodone                 2063         EXPECTED   ng/mg creat    Sources of hydrocodone include scheduled prescription    medications. Hydromorphone, dihydrocodeine and norhydrocodone are    expected metabolites of hydrocodone. Hydromorphone and    dihydrocodeine are also available as scheduled prescription    medications. ==================================================================== Test                      Result    Flag   Units      Ref Range   Creatinine              24               mg/dL      >=20 ==================================================================== Declared Medications:  The flagging and interpretation on this report are based on the  following declared medications.  Unexpected results may arise from  inaccuracies in the declared medications.  **Note: The testing scope of this panel includes these medications:   Hydrocodone (Hydrocodone-Acetaminophen)  **Note: The testing scope of this panel does not include following  reported medications:  Acetaminophen (Hydrocodone-Acetaminophen)  Albuterol  Amlodipine  Aspirin (Aspirin 81)  Atorvastatin  Budenoside (Symbicort)  Clotrimazole (Lotrisone)  Diclofenac  Escitalopram  Formoterol (Symbicort)  Hydrochlorothiazide (HCTZ)  Ipratropium  Ketoconazole  Lactulose  Levetiracetam  Losartan (Losartan Potassium)  Lubiprostone  Mirtazapine  Nitroglycerin  Ondansetron  Pantoprazole  Potassium  Sucralfate  Tizanidine  Topical  Torsemide  Venlafaxine  Vitamin D2 (Ergocalciferol) ==================================================================== For clinical consultation, please call [phone]. ====================================================================    Laboratory Chemistry Profile   Renal Lab Results  Component Value Date   BUN 24 (H) 11/22/2019   CREATININE 1.46 (H) 11/22/2019   GFRAA 44 (L) 11/22/2019   GFRNONAA 38 (L) 11/22/2019     Hepatic Lab Results  Component Value Date   AST 20 10/08/2019   ALT 21 10/08/2019   ALBUMIN 4.4 10/08/2019   ALKPHOS 57 10/08/2019   LIPASE 19 10/08/2019     Electrolytes Lab Results  Component Value Date   NA 137 11/22/2019   K 3.7 11/22/2019   CL 102 11/22/2019   CALCIUM 9.6 11/22/2019   MG 1.9 12/20/2018  Bone Lab Results  Component Value Date   25OHVITD1 14 (L) 02/07/2018   25OHVITD2 <1.0 02/07/2018   25OHVITD3 14 02/07/2018     Inflammation (CRP: Acute Phase) (ESR: Chronic Phase) Lab Results  Component Value Date   CRP <0.8 02/07/2018   ESRSEDRATE 53 (H) 02/07/2018   LATICACIDVEN 1.6 12/18/2018       Note: Above Lab results reviewed.  Imaging  DG PAIN CLINIC C-ARM 1-60 MIN NO REPORT Fluoro was used, but no Radiologist interpretation will be provided.  Please refer to "NOTES" tab for provider progress note.  Assessment  The primary encounter  diagnosis was Osteoarthritis of knee (Bilateral) (R>L). Diagnoses of Chronic musculoskeletal pain, Disturbance of skin sensation, Osteoarthritis involving multiple joints, Chronic pain syndrome, Chronic headache disorder (Primary Area of Pain), Chronic low back pain (Midline) (Secondary Area of Pain), Chronic lower extremity pain (Tertiary Area of Pain) (Bilateral) (R>L), and Chronic knee pain (Fourth Area of Pain) (Bilateral) (R>L) were also pertinent to this visit.  Plan of Care  Problem-specific:  No problem-specific Assessment & Plan notes found for this encounter.  Ms. CLAUDINE STALLINGS has a current medication list which includes the following long-term medication(s): albuterol, albuterol sulfate, amlodipine, atorvastatin, calcium carbonate, citalopram, dexilant, [START ON 03/29/2020] diclofenac sodium, diphenhydramine, hydrochlorothiazide, ipratropium, levetiracetam, [START ON 03/29/2020] lidocaine, linaclotide, losartan, nitroglycerin, [START ON 03/31/2020] oxycodone hcl, [START ON 04/30/2020] oxycodone hcl, [START ON 05/30/2020] oxycodone hcl, pantoprazole, potassium chloride sa, promethazine, sucralfate, [START ON 03/29/2020] tizanidine, and torsemide.  Pharmacotherapy (Medications Ordered): Meds ordered this encounter  Medications  . tiZANidine (ZANAFLEX) 4 MG tablet    Sig: Take 1 tablet (4 mg total) by mouth 3 (three) times daily.    Dispense:  90 tablet    Refill:  5    Fill one day early if pharmacy is closed on scheduled refill date. May substitute for generic if available.  . lidocaine (XYLOCAINE) 5 % ointment    Sig: Apply 1 application topically 4 (four) times daily as needed for moderate pain. 1 application = 6 in. of ointment = 5 g (Max: 20 g/day)    Dispense:  35.44 g    Refill:  4    Please instruct patient on correct application. Fill one day early if pharmacy is closed on scheduled refill date. May substitute for generic if available.  . diclofenac Sodium (VOLTAREN) 1 % GEL     Sig: Apply 4 g topically 4 (four) times daily as needed.    Dispense:  350 g    Refill:  PRN  . Oxycodone HCl 10 MG TABS    Sig: Take 1 tablet (10 mg total) by mouth every 8 (eight) hours as needed. Must last 30 days.    Dispense:  90 tablet    Refill:  0    Chronic Pain: STOP Act (Not applicable) Fill 1 day early if closed on refill date. Do not fill until: 03/31/2020. To last until: 04/30/2020. Avoid benzodiazepines within 8 hours of opioids  . Oxycodone HCl 10 MG TABS    Sig: Take 1 tablet (10 mg total) by mouth every 8 (eight) hours as needed. Must last 30 days.    Dispense:  90 tablet    Refill:  0    Chronic Pain: STOP Act (Not applicable) Fill 1 day early if closed on refill date. Do not fill until: 04/30/2020. To last until: 05/30/2020. Avoid benzodiazepines within 8 hours of opioids  . Oxycodone HCl 10 MG TABS    Sig: Take 1  tablet (10 mg total) by mouth every 8 (eight) hours as needed. Must last 30 days.    Dispense:  90 tablet    Refill:  0    Chronic Pain: STOP Act (Not applicable) Fill 1 day early if closed on refill date. Do not fill until: 05/30/2020. To last until: 06/29/2020. Avoid benzodiazepines within 8 hours of opioids   Orders:  No orders of the defined types were placed in this encounter.  Follow-up plan:   Return in about 3 months (around 06/28/2020) for (F2F), (MM).      Interventional treatment options:  Under consideration: NOTE: CONTRAST & FENTANYL Allergy. Diagnostic left L4 TFESI #1 Diagnostic/therapeutic right L5-S1 IA synovial cyst facet joint injection #1  Diagnostic bilateral L5 TFESI #1  Possible bilateral occipital nerve RFA Possible bilateral occipital nerve peripheral nerves stimulator trial Diagnostic bilateral cervical facet block Possible bilateral cervical facet RFA Diagnostic right CESI Diagnostic bilateral suprascapular NB Possible bilateral suprascapular nerve RFA Diagnostic right LESI Diagnostic left IA hip joint  injection Diagnostic bilateral femoral + obturator NB Possible bilateral femoral + obturator nerve RFA Diagnostic bilateral genicular NB Possible bilateral genicular RFA   Therapeutic/palliative (PRN): Diagnostic midline caudal ESI #2 (100/90/40/<50)  Diagnostic right IA hip joint injection #2  Diagnostic right trochanteric bursa injection #2  Diagnostic bilateral IA shoulder joint injection #2 (100/100/50/75)  Diagnostic right L5 TFESI #2 (100/100/97/>50)  Diagnostic left L4-5 LESI #2 (100/100/97/>50)  Palliative bilateral lumbar facet blocks #4 (100/100/90/50)  Palliative right lumbar facet RFA #2 (last done 01/16/2019) Palliative left lumbar facet RFA #2 (last done 01/30/2019) Diagnostic bilateral GONB#2(100/0/25/50)  Palliative bilateral IA knee joint injection (w/ steroid)  Palliative bilateral IA Hyalgan knee injections(series#2) (series#1completed on 07/04/2018)      Recent Visits Date Type Provider Dept  01/27/20 Procedure visit Milinda Pointer, MD [facility]  01/20/20 Procedure visit Milinda Pointer, MD [facility]  01/05/20 Office Visit Milinda Pointer, MD [facility]  Showing recent visits within past 90 days and meeting all other requirements   Today's Visits Date Type Provider Dept  03/24/20 Telemedicine Milinda Pointer, MD [facility]  Showing today's visits and meeting all other requirements   Future Appointments No visits were found meeting these conditions.  Showing future appointments within next 90 days and meeting all other requirements   I discussed the assessment and treatment plan with the patient. The patient was provided an opportunity to ask questions and all were answered. The patient agreed with the plan and demonstrated an understanding of the instructions.  Patient advised to call back or seek an in-person evaluation if the symptoms or condition worsens.  Duration of encounter: 12  minutes.  Note by: Gaspar Cola, MD Date: 03/24/2020; Time: 2:37 PM

## 2020-03-24 ENCOUNTER — Other Ambulatory Visit: Payer: Self-pay

## 2020-03-24 ENCOUNTER — Ambulatory Visit: Payer: Medicare Other | Attending: Pain Medicine | Admitting: Pain Medicine

## 2020-03-24 DIAGNOSIS — G8929 Other chronic pain: Secondary | ICD-10-CM

## 2020-03-24 DIAGNOSIS — M8949 Other hypertrophic osteoarthropathy, multiple sites: Secondary | ICD-10-CM

## 2020-03-24 DIAGNOSIS — M7918 Myalgia, other site: Secondary | ICD-10-CM

## 2020-03-24 DIAGNOSIS — M25561 Pain in right knee: Secondary | ICD-10-CM

## 2020-03-24 DIAGNOSIS — M79604 Pain in right leg: Secondary | ICD-10-CM

## 2020-03-24 DIAGNOSIS — M5441 Lumbago with sciatica, right side: Secondary | ICD-10-CM

## 2020-03-24 DIAGNOSIS — M170 Bilateral primary osteoarthritis of knee: Secondary | ICD-10-CM

## 2020-03-24 DIAGNOSIS — R519 Headache, unspecified: Secondary | ICD-10-CM

## 2020-03-24 DIAGNOSIS — M159 Polyosteoarthritis, unspecified: Secondary | ICD-10-CM

## 2020-03-24 DIAGNOSIS — M150 Primary generalized (osteo)arthritis: Secondary | ICD-10-CM

## 2020-03-24 DIAGNOSIS — R209 Unspecified disturbances of skin sensation: Secondary | ICD-10-CM | POA: Diagnosis not present

## 2020-03-24 DIAGNOSIS — M79605 Pain in left leg: Secondary | ICD-10-CM

## 2020-03-24 DIAGNOSIS — M25562 Pain in left knee: Secondary | ICD-10-CM

## 2020-03-24 DIAGNOSIS — G894 Chronic pain syndrome: Secondary | ICD-10-CM

## 2020-03-24 MED ORDER — OXYCODONE HCL 10 MG PO TABS: 10.0000 mg | ORAL_TABLET | Freq: Three times a day (TID) | ORAL | 0 refills | Status: DC | PRN

## 2020-03-24 MED ORDER — DICLOFENAC SODIUM 1 % EX GEL: 4.0000 g | Freq: Four times a day (QID) | CUTANEOUS | 99 refills | Status: DC | PRN

## 2020-03-24 MED ORDER — TIZANIDINE HCL 4 MG PO TABS: 4.0000 mg | ORAL_TABLET | Freq: Three times a day (TID) | ORAL | 5 refills | Status: AC

## 2020-03-24 MED ORDER — LIDOCAINE 5 % EX OINT: 1.0000 "application " | TOPICAL_OINTMENT | Freq: Four times a day (QID) | CUTANEOUS | 4 refills | Status: DC | PRN

## 2020-03-25 ENCOUNTER — Ambulatory Visit: Payer: Medicare Other

## 2020-03-29 ENCOUNTER — Telehealth: Payer: Medicare Other | Admitting: Pain Medicine

## 2020-06-08 ENCOUNTER — Telehealth: Payer: Self-pay | Admitting: *Deleted

## 2020-06-08 NOTE — Telephone Encounter (Signed)
Patient is c/o pain in lumbar area worse on the right and going down right leg.  Also, both knees are hurting. She would like injections for both areas.  I told patient I do see these procedures in the plan but have no orders.  Will ask Dr Laban Emperor if he will place the order for her to have these issues addressed.

## 2020-06-16 ENCOUNTER — Telehealth: Payer: Self-pay

## 2020-06-16 NOTE — Progress Notes (Signed)
PROVIDER NOTE: Information contained herein reflects review and annotations entered in association with encounter. Interpretation of such information and data should be left to medically-trained personnel. Information provided to patient can be located elsewhere in the medical record under "Patient Instructions". Document created using STT-dictation technology, any transcriptional errors that may result from process are unintentional.    Patient: Jean Gomez  Service Category: Procedure  Provider: Gaspar Cola, MD  DOB: 06-Aug-1956  DOS: 06/17/2020  Location: [facility]  MRN: [id number]  Setting: Ambulatory - outpatient  Referring Provider: Jodi Marble, MD  Type: Established Patient  Specialty: Interventional Pain Management  PCP: Jodi Marble, MD   Primary Reason for Visit: Interventional Pain Management Treatment. CC: Knee Pain (both) and Back Pain (low right)  Procedure:          Anesthesia, Analgesia, Anxiolysis:  Type: Diagnostic Intra-Articular Local anesthetic and steroid Knee Injection          Region: Lateral infrapatellar Knee Region Level: Knee Joint Laterality: Bilateral  Type: Local Anesthesia Indication(s): Analgesia         Local Anesthetic: Lidocaine 1-2% Route: Infiltration ([place]/IM) IV Access: Declined Sedation: Declined   Position: Sitting   Indications: 1. Chronic knee pain (Fourth Area of Pain) (Bilateral) (R>L)   2. Osteoarthritis of knee (Bilateral) (R>L)    Pain Score: Pre-procedure: 8 /10 Post-procedure: 6 /10   HPI  Reason for encounter: Jean Gomez, a 64 y.o. year old female, is here today for evaluation and management of her Chronic pain syndrome [G89.4]. [name] primary complain today is Knee Pain (both) and Back Pain (low right) Last encounter: Practice (06/16/2020). My last encounter with her was on 01/27/2020. Pertinent problems: Ms. Mellott has Chronic pain syndrome; Chronic low back pain (Secondary Area  of Pain) (Bilateral) (R>L) w/ sciatica (Right); Chronic lower extremity pain (Tertiary Area of Pain) (Bilateral) (R>L); Chronic knee pain (Fourth Area of Pain) (Bilateral) (R>L); Chronic shoulder pain (Fifth Area of Pain) (Bilateral) (R>L); Occipital neuralgia (Primary Area of Pain) (Bilateral) (R>L); Chronic neck pain (Bilateral) (R>L); Chronic sacroiliac joint pain (Bilateral) (R>L); Chronic migraine without aura; Muscle spasm; Osteoarthritis of knee (Bilateral) (R>L); Rotator cuff injury; Spondylosis without myelopathy or radiculopathy, lumbosacral region; Chronic shoulder pain (Right); Chronic headache disorder (Primary Area of Pain); Occipital headache (Right); Frontal headache (Left); Chronic low back pain (Midline) (Secondary Area of Pain); Chronic lumbar radiculopathy (Right); Osteoarthritis involving multiple joints; Osteoarthritis of shoulder (Bilateral); Chronic musculoskeletal pain; Lumbar facet syndrome (Bilateral) (R>L); Chronic hip pain (Tertiary Area of Pain) (Bilateral) (R>L); DDD (degenerative disc disease), cervical; Cervical facet arthropathy (Bilateral); Cervical facet syndrome (Bilateral) (R>L); Cervical Grade 1 Anterolisthesis of C4 over C5 (Degenerative); Cervical Grade 1 Retrolisthesis of C5 over C6; Cervical foraminal stenosis (C3-4, C4-5, and C5-6) (Bilateral); DDD (degenerative disc disease), lumbosacral; Osteoarthritis of lumbar spine; Osteoarthritis of facet joint of lumbar spine; Spondylosis without myelopathy or radiculopathy, cervicothoracic region; Lumbar spondylosis; Cervicogenic headache; Chronic low back pain (Secondary Area of Pain) (Bilateral) (R>L) w/o sciatica; Abnormal MRI, lumbar spine; Lumbar foraminal stenosis (Bilateral: L2-S1); Osteoarthritis of hip (Right); Greater trochanteric bursitis (Right); Lumbosacral spinal stenosis; Lumbosacral (intervertebral disc displacement) (IVDD); Lumbar nerve root impingement (Left: L4) (Right: L5); Grade 1 Anterolisthesis of L5/S1;  Lumbar facet arthropathy (L3-4, L4-5, L5-S1); Lumbosacral facet joint synovial cyst (Right: L5-S1); Neurogenic pain; and Lumbosacral spondylosis with radiculopathy (Right) on their pertinent problem list. Pain Assessment: Severity of Chronic pain is reported as a 8 /10. Location: Knee Right, Left/radiates from  back to knee. Onset: More than a month ago. Quality: Aching (catching). Timing: Constant. Modifying factor(s): denies. Vitals:  height is _0  (1.6 m) and weight is 165 lb (74.8 kg). Her temporal temperature is 98.1 F (36.7 C). Her blood pressure is 107/90 and her pulse is 106 (abnormal). Her respiration is 16 and oxygen saturation is 95%.   Note: Ms. Mccuen was initially scheduled today to come in for a medication management appointment.  She had called a couple days earlier indicated that she was having a flareup of her knee pain and back pain and she wanted me to go ahead and do some injections for that.  Unfortunately, somehow I missed this message and today when she comes in she is expecting to have both of the knees injected.  Physical exam today shows swelling in both of the knees with tenderness primarily in the lateral aspect of both knees.  Imaging of both knees confirms that she has bilateral bicompartmental osteoarthritis.  At this point I do believe that it is indicated to proceed with (Bilateral) knee joint injection.  In addition to this, the patient also indicates having a flareup of her lower back pain.  Physical exam today shows positive bilateral lumbar facet pain on hyperextension and rotation.  It also shows positive provocation test using the Patrick maneuver for bilateral sacroiliac joint pain.  The patient was informed of the findings and we will go ahead and schedule her to return for a bilateral lumbar facet block and bilateral sacroiliac joint block under fluoroscopic guidance and IV sedation.  The patient was informed that since she is receiving steroids today, we will need to  put at least 2 weeks of space between the injections.  I explained to her what the reason for this was and she understood and accepted.   The patient indicates doing well with the current medication regimen. No adverse reactions or side effects reported to the medications.  Today we will update her UDS.  Pharmacotherapy Assessment   Analgesic: Oxycodone IR 10 mg 1 tab PO q8 hrs (30 mg/day of oxycodone) MME/day:45 MME/day.   Monitoring:  PMP: PDMP reviewed during this encounter.       Pharmacotherapy: No side-effects or adverse reactions reported. Compliance: No problems identified. Effectiveness: Clinically acceptable.  Dewayne Shorter, RN  06/17/2020 11:20 AM  Signed Nursing Pain Medication Assessment:  Safety precautions to be maintained throughout the outpatient stay will include: orient to surroundings, keep bed in low position, maintain call bell within reach at all times, provide assistance with transfer out of bed and ambulation.  Medication Inspection Compliance: Pill count conducted under aseptic conditions, in front of the patient. Neither the pills nor the bottle was removed from the patient's sight at any time. Once count was completed pills were immediately returned to the patient in their original bottle.  Medication: Hydrocodone/APAP Pill/Patch Count: 34 of 90 pills remain Pill/Patch Appearance: Markings consistent with prescribed medication Bottle Appearance: Standard pharmacy container. Clearly labeled. Filled Date: 06/ 14 / 2021 Last Medication intake:  Today    UDS:  Summary  Date Value Ref Range Status  03/27/2018 FINAL  Final    Comment:    ==================================================================== TOXASSURE SELECT 13 (MW) ==================================================================== Test                             Result       Flag       Units Drug Present and  Declared for Prescription Verification   Hydrocodone                    646           EXPECTED   ng/mg creat   Hydromorphone                  367          EXPECTED   ng/mg creat   Dihydrocodeine                 317          EXPECTED   ng/mg creat   Norhydrocodone                 2063         EXPECTED   ng/mg creat    Sources of hydrocodone include scheduled prescription    medications. Hydromorphone, dihydrocodeine and norhydrocodone are    expected metabolites of hydrocodone. Hydromorphone and    dihydrocodeine are also available as scheduled prescription    medications. ==================================================================== Test                      Result    Flag   Units      Ref Range   Creatinine              24               mg/dL      >=20 ==================================================================== Declared Medications:  The flagging and interpretation on this report are based on the  following declared medications.  Unexpected results may arise from  inaccuracies in the declared medications.  **Note: The testing scope of this panel includes these medications:  Hydrocodone (Hydrocodone-Acetaminophen)  **Note: The testing scope of this panel does not include following  reported medications:  Acetaminophen (Hydrocodone-Acetaminophen)  Albuterol  Amlodipine  Aspirin (Aspirin 81)  Atorvastatin  Budenoside (Symbicort)  Clotrimazole (Lotrisone)  Diclofenac  Escitalopram  Formoterol (Symbicort)  Hydrochlorothiazide (HCTZ)  Ipratropium  Ketoconazole  Lactulose  Levetiracetam  Losartan (Losartan Potassium)  Lubiprostone  Mirtazapine  Nitroglycerin  Ondansetron  Pantoprazole  Potassium  Sucralfate  Tizanidine  Topical  Torsemide  Venlafaxine  Vitamin D2 (Ergocalciferol) ==================================================================== For clinical consultation, please call [phone]. ====================================================================      ROS  Constitutional: Denies any fever or  chills Gastrointestinal: No reported hemesis, hematochezia, vomiting, or acute GI distress Musculoskeletal: Denies any acute onset joint swelling, redness, loss of ROM, or weakness Neurological: No reported episodes of acute onset apraxia, aphasia, dysarthria, agnosia, amnesia, paralysis, loss of coordination, or loss of consciousness  Medication Review  ALPRAZolam, Albuterol Sulfate, Medical Compression Stockings, Oxycodone HCl, QUEtiapine, albuterol, amLODipine, aspirin EC, atorvastatin, betamethasone (augmented), budesonide-formoterol, busPIRone, calcium carbonate, citalopram, clotrimazole-betamethasone, dexlansoprazole, diclofenac Sodium, diphenhydrAMINE, doxycycline, hydrOXYzine, hydrochlorothiazide, ipratropium, levETIRAcetam, lidocaine, linaclotide, losartan, nitroGLYCERIN, pantoprazole, potassium chloride SA, promethazine, sucralfate, tiZANidine, torsemide, traZODone, and venlafaxine  History Review  Allergy: Ms. Nessel is allergic to contrast media [iodinated diagnostic agents], gabapentin, labetalol, sulfabenzamide, penicillins, and pregabalin. Drug: Ms. Harner  reports no history of drug use. Alcohol:  reports current alcohol use. Tobacco:  reports that she has never smoked. She has never used smokeless tobacco. Social: Ms. Worthy  reports that she has never smoked. She has never used smokeless tobacco. She reports current alcohol use. She reports that she does not use drugs. Medical:  has a past medical history of Acute postoperative pain (01/16/2019), Allergy, Asthma, Cerebral aneurysm,  Chest pain (11/01/2016), Chronic migraine, Chronic respiratory failure ([place]), COPD (chronic obstructive pulmonary disease) ([place]), Depression, Hypertension, Osteoarthritis, Pneumonia, Sarcoid, and Seizures ([place]). Surgical: Ms. Viney  has a past surgical history that includes Cholecystectomy; Abdominal hysterectomy; Spinal fusion; Back surgery; Esophagogastroduodenoscopy (egd) with propofol (N/A, 08/19/2019); and  Colonoscopy with propofol (N/A, 08/19/2019). Family: family history includes Breast cancer in her mother; Dementia in her mother; Throat cancer in her father.  Laboratory Chemistry Profile   Renal Lab Results  Component Value Date   BUN 24 (H) 11/22/2019   CREATININE 1.46 (H) 11/22/2019   GFRAA 44 (L) 11/22/2019   GFRNONAA 38 (L) 11/22/2019     Hepatic Lab Results  Component Value Date   AST 20 10/08/2019   ALT 21 10/08/2019   ALBUMIN 4.4 10/08/2019   ALKPHOS 57 10/08/2019   LIPASE 19 10/08/2019     Electrolytes Lab Results  Component Value Date   NA 137 11/22/2019   K 3.7 11/22/2019   CL 102 11/22/2019   CALCIUM 9.6 11/22/2019   MG 1.9 12/20/2018     Bone Lab Results  Component Value Date   25OHVITD1 14 (L) 02/07/2018   25OHVITD2 <1.0 02/07/2018   25OHVITD3 14 02/07/2018     Inflammation (CRP: Acute Phase) (ESR: Chronic Phase) Lab Results  Component Value Date   CRP <0.8 02/07/2018   ESRSEDRATE 53 (H) 02/07/2018   LATICACIDVEN 1.6 12/18/2018       Note: Above Lab results reviewed.  Physical Exam  General appearance: Well nourished, well developed, and well hydrated. In no apparent acute distress Mental status: Alert, oriented x 3 (person, place, & time)       Respiratory: No evidence of acute respiratory distress Eyes: PERLA Vitals: BP 107/90   Pulse (!) 106   Temp 98.1 F (36.7 C) (Temporal)   Resp 16   Ht _0  (1.6 m)   Wt 165 lb (74.8 kg)   SpO2 95%   BMI 29.23 kg/m  BMI: Estimated body mass index is 29.23 kg/m as calculated from the following:   Height as of this encounter: _1  (1.6 m).   Weight as of this encounter: 165 lb (74.8 kg). Ideal: Ideal body weight: 52.4 kg (115 lb 8.3 oz) Adjusted ideal body weight: 61.4 kg (135 lb 5 oz)  Pre-op Assessment:  Ms. Morandi is a 64 y.o. (year old), female patient, seen today for interventional treatment. She  has a past surgical history that includes Cholecystectomy; Abdominal hysterectomy; Spinal  fusion; Back surgery; Esophagogastroduodenoscopy (egd) with propofol (N/A, 08/19/2019); and Colonoscopy with propofol (N/A, 08/19/2019). Ms. Tunnell has a current medication list which includes the following prescription(s): albuterol, albuterol sulfate, alprazolam, amlodipine, aspirin ec, atorvastatin, betamethasone (augmented), budesonide-formoterol, buspirone, calcium carbonate, citalopram, clotrimazole-betamethasone, dexilant, diclofenac sodium, diphenhydramine, medical compression stockings, hydrochlorothiazide, hydroxyzine, ipratropium, levetiracetam, lidocaine, linaclotide, losartan, nitroglycerin, pantoprazole, potassium chloride sa, promethazine, quetiapine, sucralfate, tizanidine, torsemide, trazodone, venlafaxine, doxycycline, [START ON 06/29/2020] oxycodone hcl, [START ON 07/29/2020] oxycodone hcl, and [START ON 08/28/2020] oxycodone hcl. Her primarily concern today is the Knee Pain (both) and Back Pain (low right)  Initial Vital Signs:  Pulse/HCG Rate: (!) 117  Temp: 98.1 F (36.7 C) Resp: 16 BP: 95/83 SpO2: 100 %  BMI: Estimated body mass index is 29.23 kg/m as calculated from the following:   Height as of this encounter: _2  (1.6 m).   Weight as of this encounter: 165 lb (74.8 kg).  Risk Assessment: Allergies: Reviewed. She is allergic to contrast media [iodinated diagnostic agents], gabapentin,  labetalol, sulfabenzamide, penicillins, and pregabalin.  Allergy Precautions: None required Coagulopathies: Reviewed. None identified.  Blood-thinner therapy: None at this time Active Infection(s): Reviewed. None identified. Ms. Tones is afebrile  Site Confirmation: Ms. Bitterman was asked to confirm the procedure and laterality before marking the site Procedure checklist: Completed Consent: Before the procedure and under the influence of no sedative(s), amnesic(s), or anxiolytics, the patient was informed of the treatment options, risks and possible complications. To fulfill our ethical and legal  obligations, as recommended by the American Medical Association's Code of Ethics, I have informed the patient of my clinical impression; the nature and purpose of the treatment or procedure; the risks, benefits, and possible complications of the intervention; the alternatives, including doing nothing; the risk(s) and benefit(s) of the alternative treatment(s) or procedure(s); and the risk(s) and benefit(s) of doing nothing. The patient was provided information about the general risks and possible complications associated with the procedure. These may include, but are not limited to: failure to achieve desired goals, infection, bleeding, organ or nerve damage, allergic reactions, paralysis, and death. In addition, the patient was informed of those risks and complications associated to the procedure, such as failure to decrease pain; infection; bleeding; organ or nerve damage with subsequent damage to sensory, motor, and/or autonomic systems, resulting in permanent pain, numbness, and/or weakness of one or several areas of the body; allergic reactions; (i.e.: anaphylactic reaction); and/or death. Furthermore, the patient was informed of those risks and complications associated with the medications. These include, but are not limited to: allergic reactions (i.e.: anaphylactic or anaphylactoid reaction(s)); adrenal axis suppression; blood sugar elevation that in diabetics may result in ketoacidosis or comma; water retention that in patients with history of congestive heart failure may result in shortness of breath, pulmonary edema, and decompensation with resultant heart failure; weight gain; swelling or edema; medication-induced neural toxicity; particulate matter embolism and blood vessel occlusion with resultant organ, and/or nervous system infarction; and/or aseptic necrosis of one or more joints. Finally, the patient was informed that Medicine is not an exact science; therefore, there is also the possibility of  unforeseen or unpredictable risks and/or possible complications that may result in a catastrophic outcome. The patient indicated having understood very clearly. We have given the patient no guarantees and we have made no promises. Enough time was given to the patient to ask questions, all of which were answered to the patient's satisfaction. Ms. Purington has indicated that she wanted to continue with the procedure. Attestation: I, the ordering provider, attest that I have discussed with the patient the benefits, risks, side-effects, alternatives, likelihood of achieving goals, and potential problems during recovery for the procedure that I have provided informed consent. Date  Time: 06/17/2020 10:58 AM  Pre-Procedure Preparation:  Monitoring: As per clinic protocol. Respiration, ETCO2, SpO2, BP, heart rate and rhythm monitor placed and checked for adequate function Safety Precautions: Patient was assessed for positional comfort and pressure points before starting the procedure. Time-out: I initiated and conducted the "Time-out" before starting the procedure, as per protocol. The patient was asked to participate by confirming the accuracy of the "Time Out" information. Verification of the correct person, site, and procedure were performed and confirmed by me, the nursing staff, and the patient. "Time-out" conducted as per Joint Commission's Universal Protocol (UP.01.01.01). Time: 1204  Description of Procedure:          Target Area: Knee Joint Approach: Just above the Lateral tibial plateau, lateral to the infrapatellar tendon. Area Prepped: Entire knee area,  from the mid-thigh to the mid-shin. DuraPrep (Iodine Povacrylex [0.7% available iodine] and Isopropyl Alcohol, 74% w/w) Safety Precautions: Aspiration looking for blood return was conducted prior to all injections. At no point did we inject any substances, as a needle was being advanced. No attempts were made at seeking any paresthesias. Safe  injection practices and needle disposal techniques used. Medications properly checked for expiration dates. SDV (single dose vial) medications used. Description of the Procedure: Protocol guidelines were followed. The patient was placed in position over the fluoroscopy table. The target area was identified and the area prepped in the usual manner. Skin & deeper tissues infiltrated with local anesthetic. Appropriate amount of time allowed to pass for local anesthetics to take effect. The procedure needles were then advanced to the target area. Proper needle placement secured. Negative aspiration confirmed. Solution injected in intermittent fashion, asking for systemic symptoms every 0.5cc of injectate. The needles were then removed and the area cleansed, making sure to leave some of the prepping solution back to take advantage of its long term bactericidal properties. Vitals:   06/17/20 1056 06/17/20 1200  BP: 95/83 107/90  Pulse: (!) 117 (!) 106  Resp: 16 16  Temp: 98.1 F (36.7 C)   TempSrc: Temporal   SpO2: 100% 95%  Weight: 165 lb (74.8 kg)   Height: _0  (1.6 m)     Start Time: 1204 hrs. End Time: 1206 hrs. Materials:  Needle(s) Type: Regular needle Gauge: 25G Length: 1.5-in Medication(s): Please see orders for medications and dosing details.  Imaging Guidance:          Type of Imaging Technique: None used Indication(s): N/A Exposure Time: No patient exposure Contrast: None used. Fluoroscopic Guidance: N/A Ultrasound Guidance: N/A Interpretation: N/A  Antibiotic Prophylaxis:   Anti-infectives (From admission, onward)   None     Indication(s): None identified  Post-operative Assessment:  Post-procedure Vital Signs:  Pulse/HCG Rate: (!) 106  Temp: 98.1 F (36.7 C) Resp: 16 BP: 107/90 SpO2: 95 %  EBL: None  Complications: No immediate post-treatment complications observed by team, or reported by patient.  Note: The patient tolerated the entire procedure well. A  repeat set of vitals were taken after the procedure and the patient was kept under observation following institutional policy, for this type of procedure. Post-procedural neurological assessment was performed, showing return to baseline, prior to discharge. The patient was provided with post-procedure discharge instructions, including a section on how to identify potential problems. Should any problems arise concerning this procedure, the patient was given instructions to immediately contact us, at any time, without hesitation. In any case, we plan to contact the patient by telephone for a follow-up status report regarding this interventional procedure.  Comments:  No additional relevant information.  Assessment   Status Diagnosis  Controlled Worsening Progressing 1. Chronic pain syndrome   2. Chronic knee pain (Fourth Area of Pain) (Bilateral) (R>L)   3. Osteoarthritis of knee (Bilateral) (R>L)   4. Chronic headache disorder (Primary Area of Pain)   5. Chronic low back pain (Midline) (Secondary Area of Pain)   6. Chronic hip pain (Tertiary Area of Pain) (Bilateral) (R>L)   7. Pharmacologic therapy   8. Lumbar facet syndrome (Bilateral) (R>L)   9. Chronic sacroiliac joint pain (Bilateral) (R>L)       Plan of Care  Orders:  Orders Placed This Encounter  Procedures  . KNEE INJECTION    Local Anesthetic & Steroid injection.    Scheduling Instructions:  Side(s): Bilateral Knee     Sedation: None     Timeframe: Today    Order Specific Question:   Where will this procedure be performed?    Answer:   [facility]  . LUMBAR FACET(MEDIAL BRANCH NERVE BLOCK) MBNB    Standing Status:   Future    Standing Expiration Date:   07/18/2020    Scheduling Instructions:     Procedure: Lumbar facet block (AKA.: Lumbosacral medial branch nerve block)     Side: Bilateral     Level: L3-4, L4-5, & L5-S1 Facets (L2, L3, L4, L5, & S1 Medial Branch Nerves)     Sedation: Patient's choice.      Timeframe: 2 wks    Order Specific Question:   Where will this procedure be performed?    Answer:   [facility]  . SACROILIAC JOINT INJECTION    Standing Status:   Future    Standing Expiration Date:   07/18/2020    Scheduling Instructions:     Side: Bilateral     Sedation: Patient's choice.     Timeframe: 2 wks    Order Specific Question:   Where will this procedure be performed?    Answer:   [facility]  . ToxASSURE Select 13 (MW), Urine    Volume: 30 ml(s). Minimum 3 ml of urine is needed. Document temperature of fresh sample. Indications: Long term (current) use of opiate analgesic (L27.517)    Order Specific Question:   Release to patient    Answer:   Immediate  . Informed Consent Details: Physician/Practitioner Attestation; Transcribe to consent form and obtain patient signature    Provider Attestation: I, [place] Dossie Arbour, MD, (Pain Management Specialist), the physician/practitioner, attest that I have discussed with the patient the benefits, risks, side effects, alternatives, likelihood of achieving goals and potential problems during recovery for the procedure that I have provided informed consent.    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 Owens Shark, before procedure.     Transcribe to consent form and obtain patient signature.  . Provide equipment / supplies at bedside    Equipment required: Single use, disposable, "Block Tray"    Standing Status:   Standing    Number of Occurrences:   1    Order Specific Question:   Specify    Answer:   Block Tray   Chronic Opioid Analgesic:  Oxycodone IR 10 mg 1 tab PO q8 hrs (30 mg/day of oxycodone) MME/day:45 MME/day.   Medications ordered for procedure: Meds ordered this encounter  Medications  .  Oxycodone HCl 10 MG TABS    Sig: Take 1 tablet (10 mg total) by mouth every 8 (eight) hours as needed. Must last 30 days.    Dispense:  90 tablet    Refill:  0    Chronic Pain: STOP Act (Not applicable) Fill 1 day early if closed on refill date. Do not fill until: 06/29/2020. To last until: 07/29/2020. Avoid benzodiazepines within 8 hours of opioids  . Oxycodone HCl 10 MG TABS    Sig: Take 1 tablet (10 mg total) by mouth every 8 (eight) hours as needed. Must last 30 days.    Dispense:  90 tablet    Refill:  0    Chronic Pain: STOP Act (Not applicable) Fill  1 day early if closed on refill date. Do not fill until: 07/29/2020. To last until: 08/28/2020. Avoid benzodiazepines within 8 hours of opioids  . Oxycodone HCl 10 MG TABS    Sig: Take 1 tablet (10 mg total) by mouth every 8 (eight) hours as needed. Must last 30 days.    Dispense:  90 tablet    Refill:  0    Chronic Pain: STOP Act (Not applicable) Fill 1 day early if closed on refill date. Do not fill until: 08/28/2020. To last until: 09/27/2020. Avoid benzodiazepines within 8 hours of opioids  . methylPREDNISolone acetate (DEPO-MEDROL) injection 80 mg  . ropivacaine (PF) 2 mg/mL (0.2%) (NAROPIN) injection 9 mL   Medications administered: We administered methylPREDNISolone acetate and ropivacaine (PF) 2 mg/mL (0.2%).  See the medical record for exact dosing, route, and time of administration.  Follow-up plan:   Return in about 2 weeks (around 07/01/2020) for Procedure (w/ sedation): (B) L-FCT BLK + (B) SI Blk.       Interventional treatment options:  Under consideration: NOTE: CONTRAST & FENTANYL Allergy. Diagnostic left L4 TFESI #1 Diagnostic/therapeutic right L5-S1 IA synovial cyst facet joint injection #1  Diagnostic bilateral L5 TFESI #1  Possible bilateral occipital nerve RFA Possible bilateral occipital nerve peripheral nerves stimulator trial Diagnostic bilateral cervical facet block Possible bilateral cervical facet  RFA Diagnostic right CESI Diagnostic bilateral suprascapular NB Possible bilateral suprascapular nerve RFA Diagnostic right LESI Diagnostic left IA hip joint injection Diagnostic bilateral femoral + obturator NB Possible bilateral femoral + obturator nerve RFA Diagnostic bilateral genicular NB Possible bilateral genicular RFA   Therapeutic/palliative (PRN): Diagnostic midline caudal ESI #2 (100/90/40/<50)  Diagnostic right IA hip joint injection #2  Diagnostic right trochanteric bursa injection #2  Diagnostic bilateral IA shoulder joint injection #2 (100/100/50/75)  Diagnostic right L5 TFESI #2 (100/100/97/>50)  Diagnostic left L4-5 LESI #2 (100/100/97/>50)  Palliative bilateral lumbar facet blocks #4 (100/100/90/50)  Palliative right lumbar facet RFA #2 (last done 01/16/2019) Palliative left lumbar facet RFA #2 (last done 01/30/2019) Diagnostic bilateral GONB#2(100/0/25/50)  Palliative bilateral IA knee joint injection (w/ steroid)  Palliative bilateral IA Hyalgan knee injections(series#2) (series#1completed on 07/04/2018)       Recent Visits Date Type Provider Dept  03/24/20 Telemedicine Milinda Pointer, MD [facility]  Showing recent visits within past 90 days and meeting all other requirements Today's Visits Date Type Provider Dept  06/17/20 Office Visit Milinda Pointer, MD [facility]  Showing today's visits and meeting all other requirements Future Appointments Date Type Provider Dept  07/01/20 Appointment Milinda Pointer, MD [facility]  Showing future appointments within next 90 days and meeting all other requirements  Disposition: Discharge home  Discharge (Date  Time): 06/17/2020; 1209 hrs.   Primary Care Physician: Jodi Marble, MD Location: [facility] Note by: Gaspar Cola, MD Date: 06/17/2020; Time: 1:46 PM  Disclaimer:  Medicine is not an Chief Strategy Officer. The only  guarantee in medicine is that nothing is guaranteed. It is important to note that the decision to proceed with this intervention was based on the information collected from the patient. The Data and conclusions were drawn from the patient's questionnaire, the interview, and the physical examination. Because the information was provided in large part by the patient, it cannot be guaranteed that it has not been purposely or unconsciously manipulated. Every effort has been made to obtain as much relevant data as possible for this evaluation. It is important to note that  the conclusions that lead to this procedure are derived in large part from the available data. Always take into account that the treatment will also be dependent on availability of resources and existing treatment guidelines, considered by other Pain Management Practitioners as being common knowledge and practice, at the time of the intervention. For Medico-Legal purposes, it is also important to point out that variation in procedural techniques and pharmacological choices are the acceptable norm. The indications, contraindications, technique, and results of the above procedure should only be interpreted and judged by a Board-Certified Interventional Pain Specialist with extensive familiarity and expertise in the same exact procedure and technique.

## 2020-06-16 NOTE — Telephone Encounter (Signed)
Spoke with patient and let her know that Dr Laban Emperor could probably address her knee pain and possibly inject if he feels it is warranted.  She is not taking any blood thinners and does not require PA.  Patient verbalizes u/o information.

## 2020-06-16 NOTE — Telephone Encounter (Signed)
She has an eval appt on Thursday but she wants to know if she can get both knees injected while she is here. Please advise.

## 2020-06-17 ENCOUNTER — Encounter: Payer: Self-pay | Admitting: Pain Medicine

## 2020-06-17 ENCOUNTER — Ambulatory Visit: Payer: Medicare Other | Attending: Pain Medicine | Admitting: Pain Medicine

## 2020-06-17 ENCOUNTER — Other Ambulatory Visit: Payer: Self-pay

## 2020-06-17 VITALS — BP 107/90 | HR 106 | Temp 98.1°F | Resp 16 | Ht 63.0 in | Wt 165.0 lb

## 2020-06-17 DIAGNOSIS — M533 Sacrococcygeal disorders, not elsewhere classified: Secondary | ICD-10-CM | POA: Insufficient documentation

## 2020-06-17 DIAGNOSIS — G8929 Other chronic pain: Secondary | ICD-10-CM | POA: Diagnosis present

## 2020-06-17 DIAGNOSIS — M5441 Lumbago with sciatica, right side: Secondary | ICD-10-CM | POA: Diagnosis present

## 2020-06-17 DIAGNOSIS — M25552 Pain in left hip: Secondary | ICD-10-CM | POA: Insufficient documentation

## 2020-06-17 DIAGNOSIS — Z79899 Other long term (current) drug therapy: Secondary | ICD-10-CM

## 2020-06-17 DIAGNOSIS — M47816 Spondylosis without myelopathy or radiculopathy, lumbar region: Secondary | ICD-10-CM | POA: Diagnosis present

## 2020-06-17 DIAGNOSIS — R519 Headache, unspecified: Secondary | ICD-10-CM | POA: Insufficient documentation

## 2020-06-17 DIAGNOSIS — M25551 Pain in right hip: Secondary | ICD-10-CM

## 2020-06-17 DIAGNOSIS — M170 Bilateral primary osteoarthritis of knee: Secondary | ICD-10-CM | POA: Diagnosis present

## 2020-06-17 DIAGNOSIS — M25561 Pain in right knee: Secondary | ICD-10-CM | POA: Insufficient documentation

## 2020-06-17 DIAGNOSIS — M25562 Pain in left knee: Secondary | ICD-10-CM | POA: Diagnosis present

## 2020-06-17 DIAGNOSIS — G894 Chronic pain syndrome: Secondary | ICD-10-CM | POA: Diagnosis present

## 2020-06-17 MED ORDER — OXYCODONE HCL 10 MG PO TABS: 10.0000 mg | ORAL_TABLET | Freq: Three times a day (TID) | ORAL | 0 refills | Status: DC | PRN

## 2020-06-17 MED ORDER — ROPIVACAINE HCL 2 MG/ML IJ SOLN
9.0000 mL | Freq: Once | INTRAMUSCULAR | Status: AC
  Administered 2020-06-17: 9 mL via INTRA_ARTICULAR

## 2020-06-17 MED ORDER — LIDOCAINE HCL (PF) 1 % IJ SOLN
INTRAMUSCULAR | Status: AC
  Filled 2020-06-17: qty 5

## 2020-06-17 MED ORDER — METHYLPREDNISOLONE ACETATE 80 MG/ML IJ SUSP
80.0000 mg | Freq: Once | INTRAMUSCULAR | Status: AC
  Administered 2020-06-17: 80 mg via INTRA_ARTICULAR
  Filled 2020-06-17: qty 1

## 2020-06-17 MED ORDER — ROPIVACAINE HCL 2 MG/ML IJ SOLN
INTRAMUSCULAR | Status: AC
  Filled 2020-06-17: qty 10

## 2020-06-17 NOTE — Patient Instructions (Addendum)
____________________________________________________________________________________________  Drug Holidays (Slow)  What is a "Drug Holiday"? Drug Holiday: is the name given to the period of time during which a patient stops taking a medication(s) for the purpose of eliminating tolerance to the drug.  Benefits . Improved effectiveness of opioids. . Decreased opioid dose needed to achieve benefits. . Improved pain with lesser dose.  What is tolerance? Tolerance: is the progressive decreased in effectiveness of a drug due to its repetitive use. With repetitive use, the body gets use to the medication and as a consequence, it loses its effectiveness. This is a common problem seen with opioid pain medications. As a result, a larger dose of the drug is needed to achieve the same effect that used to be obtained with a smaller dose.  How long should a "Drug Holiday" last? You should stay off of the pain medicine for at least 14 consecutive days. (2 weeks)  Should I stop the medicine "cold turkey"? No. You should always coordinate with your Pain Specialist so that he/she can provide you with the correct medication dose to make the transition as smoothly as possible.  How do I stop the medicine? Slowly. You will be instructed to decrease the daily amount of pills that you take by one (1) pill every seven (7) days. This is called a "slow downward taper" of your dose. For example: if you normally take four (4) pills per day, you will be asked to drop this dose to three (3) pills per day for seven (7) days, then to two (2) pills per day for seven (7) days, then to one (1) per day for seven (7) days, and at the end of those last seven (7) days, this is when the "Drug Holiday" would start.   Will I have withdrawals? By doing a "slow downward taper" like this one, it is unlikely that you will experience any significant withdrawal symptoms. Typically, what triggers withdrawals is the sudden stop of a high  dose opioid therapy. Withdrawals can usually be avoided by slowly decreasing the dose over a prolonged period of time.  What are withdrawals? Withdrawals: refers to the wide range of symptoms that occur after stopping or dramatically reducing opiate drugs after heavy and prolonged use. Withdrawal symptoms do not occur to patients that use low dose opioids, or those who take the medication sporadically. Contrary to benzodiazepine (example: Valium, Xanax, etc.) or alcohol withdrawals ("Delirium Tremens"), opioid withdrawals are not lethal. Withdrawals are the physical manifestation of the body getting rid of the excess receptors.  Expected Symptoms Early symptoms of withdrawal may include: . Agitation . Anxiety . Muscle aches . Increased tearing . Insomnia . Runny nose . Sweating . Yawning  Late symptoms of withdrawal may include: . Abdominal cramping . Diarrhea . Dilated pupils . Goose bumps . Nausea . Vomiting  Will I experience withdrawals? Due to the slow nature of the taper, it is very unlikely that you will experience any.  What is a slow taper? Taper: refers to the gradual decrease in dose.  ___________________________________________________________________________________________    ____________________________________________________________________________________________  Medication Rules  Purpose: To inform patients, and their family members, of our rules and regulations.  Applies to: All patients receiving prescriptions (written or electronic).  Pharmacy of record: Pharmacy where electronic prescriptions will be sent. If written prescriptions are taken to a different pharmacy, please inform the nursing staff. The pharmacy listed in the electronic medical record should be the one where you would like electronic prescriptions to be sent.  Electronic   prescriptions: In compliance with the [place] Strengthen Opioid Misuse Prevention (STOP) Act of 2017 (Session  Law 2017-74/H243), effective December 18, 2018, all controlled substances must be electronically prescribed. Calling prescriptions to the pharmacy will cease to exist.  Prescription refills: Only during scheduled appointments. Applies to all prescriptions.  NOTE: The following applies primarily to controlled substances (Opioid* Pain Medications).   Type of encounter (visit): For patients receiving controlled substances, face-to-face visits are required. (Not an option or up to the patient.)  Patient's responsibilities: 1. Pain Pills: Bring all pain pills to every appointment (except for procedure appointments). 2. Pill Bottles: Bring pills in original pharmacy bottle. Always bring the newest bottle. Bring bottle, even if empty. 3. Medication refills: You are responsible for knowing and keeping track of what medications you take and those you need refilled. The day before your appointment: write a list of all prescriptions that need to be refilled. The day of the appointment: give the list to the admitting nurse. Prescriptions will be written only during appointments. No prescriptions will be written on procedure days. If you forget a medication: it will not be "Called in", "Faxed", or "electronically sent". You will need to get another appointment to get these prescribed. No early refills. Do not call asking to have your prescription filled early. 4. Prescription Accuracy: You are responsible for carefully inspecting your prescriptions before leaving our office. Have the discharge nurse carefully go over each prescription with you, before taking them home. Make sure that your name is accurately spelled, that your address is correct. Check the name and dose of your medication to make sure it is accurate. Check the number of pills, and the written instructions to make sure they are clear and accurate. Make sure that you are given enough medication to last until your next medication refill  appointment. 5. Taking Medication: Take medication as prescribed. When it comes to controlled substances, taking less pills or less frequently than prescribed is permitted and encouraged. Never take more pills than instructed. Never take medication more frequently than prescribed.  6. Inform other Doctors: Always inform, all of your healthcare providers, of all the medications you take. 7. Pain Medication from other Providers: You are not allowed to accept any additional pain medication from any other Doctor or Healthcare provider. There are two exceptions to this rule. (see below) In the event that you require additional pain medication, you are responsible for notifying us, as stated below. 8. Medication Agreement: You are responsible for carefully reading and following our Medication Agreement. This must be signed before receiving any prescriptions from our practice. Safely store a copy of your signed Agreement. Violations to the Agreement will result in no further prescriptions. (Additional copies of our Medication Agreement are available upon request.) 9. Laws, Rules, & Regulations: All patients are expected to follow all Federal and State Laws, Statutes, Rules, & Regulations. Ignorance of the Laws does not constitute a valid excuse.  10. Illegal drugs and Controlled Substances: The use of illegal substances (including, but not limited to marijuana and its derivatives) and/or the illegal use of any controlled substances is strictly prohibited. Violation of this rule may result in the immediate and permanent discontinuation of any and all prescriptions being written by our practice. The use of any illegal substances is prohibited. 11. Adopted CDC guidelines & recommendations: Target dosing levels will be at or below 60 MME/day. Use of benzodiazepines** is not recommended.  Exceptions: There are only two exceptions to the rule of not   receiving pain medications from other Healthcare  Providers. 1. Exception #1 (Emergencies): In the event of an emergency (i.e.: accident requiring emergency care), you are allowed to receive additional pain medication. However, you are responsible for: As soon as you are able, call our office [phone], at any time of the day or night, and leave a message stating your name, the date and nature of the emergency, and the name and dose of the medication prescribed. In the event that your call is answered by a member of our staff, make sure to document and save the date, time, and the name of the person that took your information.  2. Exception #2 (Planned Surgery): In the event that you are scheduled by another doctor or dentist to have any type of surgery or procedure, you are allowed (for a period no longer than 30 days), to receive additional pain medication, for the acute post-op pain. However, in this case, you are responsible for picking up a copy of our "Post-op Pain Management for Surgeons" handout, and giving it to your surgeon or dentist. This document is available at our office, and does not require an appointment to obtain it. Simply go to our office during business hours (Monday-Thursday from 8:00 AM to 4:00 PM) (Friday 8:00 AM to 12:00 Noon) or if you have a scheduled appointment with us, prior to your surgery, and ask for it by name. In addition, you will need to provide us with your name, name of your surgeon, type of surgery, and date of procedure or surgery.  *Opioid medications include: morphine, codeine, oxycodone, oxymorphone, hydrocodone, hydromorphone, meperidine, tramadol, tapentadol, buprenorphine, fentanyl, methadone. **Benzodiazepine medications include: diazepam (Valium), alprazolam (Xanax), clonazepam (Klonopine), lorazepam (Ativan), clorazepate (Tranxene), chlordiazepoxide (Librium), estazolam (Prosom), oxazepam (Serax), temazepam (Restoril), triazolam (Halcion) (Last updated:  02/14/2018) ____________________________________________________________________________________________   ____________________________________________________________________________________________  Medication Recommendations and Reminders  Applies to: All patients receiving prescriptions (written and/or electronic).  Medication Rules & Regulations: These rules and regulations exist for your safety and that of others. They are not flexible and neither are we. Dismissing or ignoring them will be considered "non-compliance" with medication therapy, resulting in complete and irreversible termination of such therapy. (See document titled "Medication Rules" for more details.) In all conscience, because of safety reasons, we cannot continue providing a therapy where the patient does not follow instructions.  Pharmacy of record:   Definition: This is the pharmacy where your electronic prescriptions will be sent.   We do not endorse any particular pharmacy.  You are not restricted in your choice of pharmacy.  The pharmacy listed in the electronic medical record should be the one where you want electronic prescriptions to be sent.  If you choose to change pharmacy, simply notify our nursing staff of your choice of new pharmacy.  Recommendations:  Keep all of your pain medications in a safe place, under lock and key, even if you live alone.   After you fill your prescription, take 1 week's worth of pills and put them away in a safe place. You should keep a separate, properly labeled bottle for this purpose. The remainder should be kept in the original bottle. Use this as your primary supply, until it runs out. Once it's gone, then you know that you have 1 week's worth of medicine, and it is time to come in for a prescription refill. If you do this correctly, it is unlikely that you will ever run out of medicine.  To make sure that the above recommendation works,   it is very important that you  make sure your medication refill appointments are scheduled at least 1 week before you run out of medicine. To do this in an effective manner, make sure that you do not leave the office without scheduling your next medication management appointment. Always ask the nursing staff to show you in your prescription , when your medication will be running out. Then arrange for the receptionist to get you a return appointment, at least 7 days before you run out of medicine. Do not wait until you have 1 or 2 pills left, to come in. This is very poor planning and does not take into consideration that we may need to cancel appointments due to bad weather, sickness, or emergencies affecting our staff.  "Partial Fill": If for any reason your pharmacy does not have enough pills/tablets to completely fill or refill your prescription, do not allow for a "partial fill". You will need a separate prescription to fill the remaining amount, which we will not provide. If the reason for the partial fill is your insurance, you will need to talk to the pharmacist about payment alternatives for the remaining tablets, but again, do not accept a partial fill.  Prescription refills and/or changes in medication(s):   Prescription refills, and/or changes in dose or medication, will be conducted only during scheduled medication management appointments. (Applies to both, written and electronic prescriptions.)  No refills on procedure days. No medication will be changed or started on procedure days. No changes, adjustments, and/or refills will be conducted on a procedure day. Doing so will interfere with the diagnostic portion of the procedure.  No phone refills. No medications will be "called into the pharmacy".  No Fax refills.  No weekend refills.  No Holliday refills.  No after hours refills.  Remember:  Business hours are:  Monday to Thursday 8:00 AM to 4:00 PM Provider's Schedule: Jerolene Kupfer, MD - Appointments  are:  Medication management: Monday and Wednesday 8:00 AM to 4:00 PM Procedure day: Tuesday and Thursday 7:30 AM to 4:00 PM Bilal Lateef, MD - Appointments are:  Medication management: Tuesday and Thursday 8:00 AM to 4:00 PM Procedure day: Monday and Wednesday 7:30 AM to 4:00 PM (Last update: 02/14/2018) ____________________________________________________________________________________________   ____________________________________________________________________________________________  CANNABIDIOL (AKA: CBD Oil or Pills)  Applies to: All patients receiving prescriptions of controlled substances (written and/or electronic).  General Information: Cannabidiol (CBD) was discovered in 1940. It is one of some 113 identified cannabinoids in cannabis (Marijuana) plants, accounting for up to 40% of the plant's extract. As of 2018, preliminary clinical research on cannabidiol included studies of anxiety, cognition, movement disorders, and pain.  Cannabidiol is consummed in multiple ways, including inhalation of cannabis smoke or vapor, as an aerosol spray into the cheek, and by mouth. It may be supplied as CBD oil containing CBD as the active ingredient (no added tetrahydrocannabinol (THC) or terpenes), a full-plant CBD-dominant hemp extract oil, capsules, dried cannabis, or as a liquid solution. CBD is thought not have the same psychoactivity as THC, and may affect the actions of THC. Studies suggest that CBD may interact with different biological targets, including cannabinoid receptors and other neurotransmitter receptors. As of 2018 the mechanism of action for its biological effects has not been determined.  In the [place], cannabidiol has a limited approval by the Food and Drug Administration (FDA) for treatment of only two types of epilepsy disorders. The side effects of long-term use of the drug include somnolence, decreased appetite, diarrhea,   fatigue, malaise, weakness, sleeping  problems, and others.  CBD remains a Schedule I drug prohibited for any use.  Legality: Some manufacturers ship CBD products nationally, an illegal action which the FDA has not enforced in 2018, with CBD remaining the subject of an FDA investigational new drug evaluation, and is not considered legal as a dietary supplement or food ingredient as of December 2018. Federal illegality has made it difficult historically to conduct research on CBD. CBD is openly sold in head shops and health food stores in some states where such sales have not been explicitly legalized.  Warning: Because it is not FDA approved for general use or treatment of pain, it is not required to undergo the same manufacturing controls as prescription drugs.  This means that the available cannabidiol (CBD) may be contaminated with THC.  If this is the case, it will trigger a positive urine drug screen (UDS) test for cannabinoids (Marijuana).  Because a positive UDS for illicit substances is a violation of our medication agreement, your opioid analgesics (pain medicine) may be permanently discontinued. (Last update: 03/07/2018) ____________________________________________________________________________________________   ____________________________________________________________________________________________  Post-Procedure Discharge Instructions  Instructions:  Apply ice:   Purpose: This will minimize any swelling and discomfort after procedure.   When: Day of procedure, as soon as you get home.  How: Fill a plastic sandwich bag with crushed ice. Cover it with a small towel and apply to injection site.  How long: (15 min on, 15 min off) Apply for 15 minutes then remove x 15 minutes.  Repeat sequence on day of procedure, until you go to bed.  Apply heat:   Purpose: To treat any soreness and discomfort from the procedure.  When: Starting the next day after the procedure.  How: Apply heat to procedure site starting  the day following the procedure.  How long: May continue to repeat daily, until discomfort goes away.  Food intake: Start with clear liquids (like water) and advance to regular food, as tolerated.   Physical activities: Keep activities to a minimum for the first 8 hours after the procedure. After that, then as tolerated.  Driving: If you have received any sedation, be responsible and do not drive. You are not allowed to drive for 24 hours after having sedation.  Blood thinner: (Applies only to those taking blood thinners) You may restart your blood thinner 6 hours after your procedure.  Insulin: (Applies only to Diabetic patients taking insulin) As soon as you can eat, you may resume your normal dosing schedule.  Infection prevention: Keep procedure site clean and dry. Shower daily and clean area with soap and water.  Post-procedure Pain Diary: Extremely important that this be done correctly and accurately. Recorded information will be used to determine the next step in treatment. For the purpose of accuracy, follow these rules:  Evaluate only the area treated. Do not report or include pain from an untreated area. For the purpose of this evaluation, ignore all other areas of pain, except for the treated area.  After your procedure, avoid taking a long nap and attempting to complete the pain diary after you wake up. Instead, set your alarm clock to go off every hour, on the hour, for the initial 8 hours after the procedure. Document the duration of the numbing medicine, and the relief you are getting from it.  Do not go to sleep and attempt to complete it later. It will not be accurate. If you received sedation, it is likely that you were given  a medication that may cause amnesia. Because of this, completing the diary at a later time may cause the information to be inaccurate. This information is needed to plan your care.  Follow-up appointment: Keep your post-procedure follow-up evaluation  appointment after the procedure (usually 2 weeks for most procedures, 6 weeks for radiofrequencies). DO NOT FORGET to bring you pain diary with you.   Expect: (What should I expect to see with my procedure?)  From numbing medicine (AKA: Local Anesthetics): Numbness or decrease in pain. You may also experience some weakness, which if present, could last for the duration of the local anesthetic.  Onset: Full effect within 15 minutes of injected.  Duration: It will depend on the type of local anesthetic used. On the average, 1 to 8 hours.   From steroids (Applies only if steroids were used): Decrease in swelling or inflammation. Once inflammation is improved, relief of the pain will follow.  Onset of benefits: Depends on the amount of swelling present. The more swelling, the longer it will take for the benefits to be seen. In some cases, up to 10 days.  Duration: Steroids will stay in the system x 2 weeks. Duration of benefits will depend on multiple posibilities including persistent irritating factors.  Side-effects: If present, they may typically last 2 weeks (the duration of the steroids).  Frequent: Cramps (if they occur, drink Gatorade and take over-the-counter Magnesium 450-500 mg once to twice a day); water retention with temporary weight gain; increases in blood sugar; decreased immune system response; increased appetite.  Occasional: Facial flushing (red, warm cheeks); mood swings; menstrual changes.  Uncommon: Long-term decrease or suppression of natural hormones; bone thinning. (These are more common with higher doses or more frequent use. This is why we prefer that our patients avoid having any injection therapies in other practices.)   Very Rare: Severe mood changes; psychosis; aseptic necrosis.  From procedure: Some discomfort is to be expected once the numbing medicine wears off. This should be minimal if ice and heat are applied as instructed.  Call if: (When should I  call?)  You experience numbness and weakness that gets worse with time, as opposed to wearing off.  New onset bowel or bladder incontinence. (Applies only to procedures done in the spine)  Emergency Numbers:  Durning business hours (Monday - Thursday, 8:00 AM - 4:00 PM) (Friday, 9:00 AM - 12:00 Noon): [phone]  After hours: [phone]  NOTE: If you are having a problem and are unable connect with, or to talk to a provider, then go to your nearest urgent care or emergency department. If the problem is serious and urgent, please call 911. ____________________________________________________________________________________________   ____________________________________________________________________________________________  Preparing for Procedure with Sedation  Procedure appointments are limited to planned procedures: . No Prescription Refills. . No disability issues will be discussed. . No medication changes will be discussed.  Instructions: . Oral Intake: Do not eat or drink anything for at least 8 hours prior to your procedure. (Exception: Blood Pressure Medication. See below.) . Transportation: Unless otherwise stated by your physician, you may drive yourself after the procedure. . Blood Pressure Medicine: Do not forget to take your blood pressure medicine with a sip of water the morning of the procedure. If your Diastolic (lower reading)is above 100 mmHg, elective cases will be cancelled/rescheduled. . Blood thinners: These will need to be stopped for procedures. Notify our staff if you are taking any blood thinners. Depending on which one you take, there will be specific  instructions on how and when to stop it. . Diabetics on insulin: Notify the staff so that you can be scheduled 1st case in the morning. If your diabetes requires high dose insulin, take only  of your normal insulin dose the morning of the procedure and notify the staff that you have done  so. . Preventing infections: Shower with an antibacterial soap the morning of your procedure. . Build-up your immune system: Take 1000 mg of Vitamin C with every meal (3 times a day) the day prior to your procedure. [facility] Kitchen Antibiotics: Inform the staff if you have a condition or reason that requires you to take antibiotics before dental procedures. . Pregnancy: If you are pregnant, call and cancel the procedure. . Sickness: If you have a cold, fever, or any active infections, call and cancel the procedure. . Arrival: You must be in the facility at least 30 minutes prior to your scheduled procedure. . Children: Do not bring children with you. . Dress appropriately: Bring dark clothing that you would not mind if they get stained. . Valuables: Do not bring any jewelry or valuables.  Reasons to call and reschedule or cancel your procedure: (Following these recommendations will minimize the risk of a serious complication.) . Surgeries: Avoid having procedures within 2 weeks of any surgery. (Avoid for 2 weeks before or after any surgery). . Flu Shots: Avoid having procedures within 2 weeks of a flu shots or . (Avoid for 2 weeks before or after immunizations). . Barium: Avoid having a procedure within 7-10 days after having had a radiological study involving the use of radiological contrast. (Myelograms, Barium swallow or enema study). . Heart attacks: Avoid any elective procedures or surgeries for the initial 6 months after a "Myocardial Infarction" (Heart Attack). . Blood thinners: It is imperative that you stop these medications before procedures. Let us know if you if you take any blood thinner.  . Infection: Avoid procedures during or within two weeks of an infection (including chest colds or gastrointestinal problems). Symptoms associated with infections include: Localized redness, fever, chills, night sweats or profuse sweating, burning sensation when voiding, cough, congestion, stuffiness, runny nose,  sore throat, diarrhea, nausea, vomiting, cold or Flu symptoms, recent or current infections. It is specially important if the infection is over the area that we intend to treat. Marland Kitchen Heart and lung problems: Symptoms that may suggest an active cardiopulmonary problem include: cough, chest pain, breathing difficulties or shortness of breath, dizziness, ankle swelling, uncontrolled high or unusually low blood pressure, and/or palpitations. If you are experiencing any of these symptoms, cancel your procedure and contact your primary care physician for an evaluation.  Remember:  Regular Business hours are:  Monday to Thursday 8:00 AM to 4:00 PM  Provider's Schedule: Delano Metz, MD:  Procedure days: Tuesday and Thursday 7:30 AM to 4:00 PM  Edward Jolly, MD:  Procedure days: Monday and Wednesday 7:30 AM to 4:00 PM ____________________________________________________________________________________________   Preparing for Procedure with Sedation Instructions: . Oral Intake: Do not eat or drink anything for at least 8 hours prior to your procedure. . Transportation: Public transportation is not allowed. Bring an adult driver. The driver must be physically present in our waiting room before any procedure can be started. Marland Kitchen Physical Assistance: Bring an adult capable of physically assisting you, in the event you need help. . Blood Pressure Medicine: Take your blood pressure medicine with a sip of water the morning of the procedure. . Insulin: Take only  of your normal insulin  dose. . Preventing infections: Shower with an antibacterial soap the morning of your procedure. . Build-up your immune system: Take 1000 mg of Vitamin C with every meal (3 times a day) the day prior to your procedure. . Pregnancy: If you are pregnant, call and cancel the procedure. . Sickness: If you have a cold, fever, or any active infections, call and cancel the procedure. . Arrival: You must be in the facility at least  30 minutes prior to your scheduled procedure. . Children: Do not bring children with you. . Dress appropriately: Bring dark clothing that you would not mind if they get stained. . Valuables: Do not bring any jewelry or valuables. Procedure appointments are reserved for interventional treatments only. Marland Kitchen. No Prescription Refills. . No medication changes will be discussed during procedure appointments. . No disability issues will be discussed. .Marland Kitchen

## 2020-06-17 NOTE — Progress Notes (Signed)
Nursing Pain Medication Assessment:  Safety precautions to be maintained throughout the outpatient stay will include: orient to surroundings, keep bed in low position, maintain call bell within reach at all times, provide assistance with transfer out of bed and ambulation.  Medication Inspection Compliance: Pill count conducted under aseptic conditions, in front of the patient. Neither the pills nor the bottle was removed from the patient's sight at any time. Once count was completed pills were immediately returned to the patient in their original bottle.  Medication: Hydrocodone/APAP Pill/Patch Count: 34 of 90 pills remain Pill/Patch Appearance: Markings consistent with prescribed medication Bottle Appearance: Standard pharmacy container. Clearly labeled. Filled Date: 06/ 14 / 2021 Last Medication intake:  Today

## 2020-06-24 ENCOUNTER — Ambulatory Visit: Payer: Medicare Other | Admitting: Pain Medicine

## 2020-06-24 LAB — TOXASSURE SELECT 13 (MW), URINE

## 2020-06-30 NOTE — Progress Notes (Signed)
PROVIDER NOTE: Information contained herein reflects review and annotations entered in association with encounter. Interpretation of such information and data should be left to medically-trained personnel. Information provided to patient can be located elsewhere in the medical record under "Patient Instructions". Document created using STT-dictation technology, any transcriptional errors that may result from process are unintentional.    Patient: Jean Gomez  Service Category: Procedure  Provider: Oswaldo Done, MD  DOB: Sep 08, 1956  DOS: 07/01/2020  Location: [facility]  MRN: [id number]  Setting: Ambulatory - outpatient  Referring Provider: Sherron Monday, MD  Type: Established Patient  Specialty: Interventional Pain Management  PCP: Sherron Monday, MD   Primary Reason for Visit: Interventional Pain Management Treatment. CC: Back Pain (lower)  Procedure:          Anesthesia, Analgesia, Anxiolysis:  Procedure #1: Type: Medial Branch Facet Block #4 Primary Purpose: Diagnostic Region: Lumbar Level: L2, L3, L4, L5, & S1 Medial Branch Level(s) Target Area: For Lumbar Facet blocks, the target is the groove formed by the junction of the transverse process and superior articular process. For the L5 dorsal ramus, the target is the notch between superior articular process and sacral ala. For the S1 dorsal ramus, the target is the superior and lateral edge of the posterior S1 Sacral foramen. Approach: Posterior, paramedial, percutaneous approach. Laterality: Bilateral  Procedure #2: Type: Sacroiliac Joint Block  #1  Primary Purpose: Diagnostic Region: Posterior Lumbosacral Level: PSIS (Posterior Superior Iliac Spine) Sacroiliac Joint Target Area: For upper sacroiliac joint block(s), the target is the superior and posterior margin of the sacroiliac joint. Approach: Ipsilateral approach. Laterality: Bilateral  Type: Moderate (Conscious) Sedation combined with Local  Anesthesia Indication(s): Analgesia and Anxiety Route: Intravenous (IV) IV Access: Secured Sedation: Meaningful verbal contact was maintained at all times during the procedure  Local Anesthetic: Lidocaine 1-2%  Position: Prone   Indications: 1. Spondylosis without myelopathy or radiculopathy, lumbosacral region   2. Lumbar facet syndrome (Bilateral) (R>L)   3. Lumbar facet arthropathy (L3-4, L4-5, L5-S1)   4. DDD (degenerative disc disease), lumbosacral   5. Other spondylosis, sacral and sacrococcygeal region   6. Chronic sacroiliac joint pain (Bilateral) (R>L)   7. Chronic low back pain (Secondary Area of Pain) (Bilateral) (R>L) w/o sciatica   8. History of allergy to radiographic contrast media    Pain Score: Pre-procedure: 9 /10 Post-procedure: 0-No pain/10   Unfortunately, review of the patient's last UDS on 06/17/2020 revealed an unreported level of carboxy THC (marijuana).  This is a violation of our medication agreement and therefore today we will be terminating her opioid analgesic therapy.  We will continue to offer interventional therapies, as required.  Today I will be sending prescriptions to the pharmacy to taper her off of the opioids.  This will be done slowly at a rate of a drop of 5 mg of oxycodone per week.  According to my calculations, she should be off of her oxycodone by 09/02/2020.  Post-Procedure Evaluation  Procedure: Therapeutic bilateral IA knee joint injection with steroids, no fluoroscopy or IV sedation Pre-procedure pain level: 8/10 Post-procedure: 6/10 Limited initial benefit, possibly due to rapid discharge after no sedation procedure, without enough time to allow full onset of block.  Sedation: None.  Effectiveness during initial hour after procedure(Ultra-Short Term Relief): 100 %.  Local anesthetic used: Long-acting (4-6 hours) Effectiveness: Defined as any analgesic benefit obtained secondary to the administration of local anesthetics. This carries  significant diagnostic value as to the etiological  location, or anatomical origin, of the pain. Duration of benefit is expected to coincide with the duration of the local anesthetic used.  Effectiveness during initial 4-6 hours after procedure(Short-Term Relief): 100 %.  Long-term benefit: Defined as any relief past the pharmacologic duration of the local anesthetics.  Effectiveness past the initial 6 hours after procedure(Long-Term Relief): 50 %.  Current benefits: Defined as benefit that persist at this time.   Analgesia:  50% improved Function: Jean Gomez improvement in function ROM: Jean Gomez improvement in ROM  Pre-op Assessment:  Jean Gomez is a 64 y.o. (year old), female patient, seen today for interventional treatment. She  has a past surgical history that includes Cholecystectomy; Abdominal hysterectomy; Spinal fusion; Back surgery; Esophagogastroduodenoscopy (egd) with propofol (N/A, 08/19/2019); and Colonoscopy with propofol (N/A, 08/19/2019). Jean Gomez has a current medication list which includes the following prescription(s): albuterol, albuterol sulfate, alprazolam, amlodipine, aspirin ec, atorvastatin, betamethasone (augmented), budesonide-formoterol, buspirone, calcium carbonate, citalopram, clotrimazole-betamethasone, dexilant, diclofenac sodium, diphenhydramine, medical compression stockings, hydrochlorothiazide, hydroxyzine, ipratropium, levetiracetam, lidocaine, linaclotide, losartan, nitroglycerin, oxycodone hcl, pantoprazole, potassium chloride sa, promethazine, quetiapine, sucralfate, tizanidine, torsemide, trazodone, venlafaxine, [START ON 07/29/2020] oxycodone, [START ON 08/05/2020] oxycodone, [START ON 08/12/2020] oxycodone, [START ON 08/19/2020] oxycodone, and [START ON 08/26/2020] oxycodone, and the following Facility-Administered Medications: diphenhydramine and midazolam. Her primarily concern today is the Back Pain (lower)  Initial Vital Signs:  Pulse/HCG Rate: 95ECG  Heart Rate: (!) 101 Temp: (!) 97.3 F (36.3 C) Resp: 16 BP: 134/84 SpO2: 96 %  BMI: Estimated body mass index is 28.87 kg/m as calculated from the following:   Height as of this encounter: 5\' 3"  (1.6 m).   Weight as of this encounter: 163 lb (73.9 kg).  Risk Assessment: Allergies: Reviewed. She is allergic to contrast media [iodinated diagnostic agents], gabapentin, labetalol, sulfabenzamide, penicillins, and pregabalin.  Allergy Precautions: None required Coagulopathies: Reviewed. None identified.  Blood-thinner therapy: None at this time Active Infection(s): Reviewed. None identified. Ms. Fojtik is afebrile  Site Confirmation: Ms. Tuckett was asked to confirm the procedure and laterality before marking the site Procedure checklist: Completed Consent: Before the procedure and under the influence of no sedative(s), amnesic(s), or anxiolytics, the patient was informed of the treatment options, risks and possible complications. To fulfill our ethical and legal obligations, as recommended by the American Medical Association's Code of Ethics, I have informed the patient of my clinical impression; the nature and purpose of the treatment or procedure; the risks, benefits, and possible complications of the intervention; the alternatives, including doing nothing; the risk(s) and benefit(s) of the alternative treatment(s) or procedure(s); and the risk(s) and benefit(s) of doing nothing. The patient was provided information about the general risks and possible complications associated with the procedure. These may include, but are not limited to: failure to achieve desired goals, infection, bleeding, organ or nerve damage, allergic reactions, paralysis, and death. In addition, the patient was informed of those risks and complications associated to Spine-related procedures, such as failure to decrease pain; infection (i.e.: Meningitis, epidural or intraspinal abscess); bleeding (i.e.: epidural hematoma,  subarachnoid hemorrhage, or any other type of intraspinal or peri-dural bleeding); organ or nerve damage (i.e.: Any type of peripheral nerve, nerve root, or spinal cord injury) with subsequent damage to sensory, motor, and/or autonomic systems, resulting in permanent pain, numbness, and/or weakness of one or several areas of the body; allergic reactions; (i.e.: anaphylactic reaction); and/or death. Furthermore, the patient was informed of those risks and complications associated with the medications. These include, but  are not limited to: allergic reactions (i.e.: anaphylactic or anaphylactoid reaction(s)); adrenal axis suppression; blood sugar elevation that in diabetics may result in ketoacidosis or comma; water retention that in patients with history of congestive heart failure may result in shortness of breath, pulmonary edema, and decompensation with resultant heart failure; weight gain; swelling or edema; medication-induced neural toxicity; particulate matter embolism and blood vessel occlusion with resultant organ, and/or nervous system infarction; and/or aseptic necrosis of one or more joints. Finally, the patient was informed that Medicine is not an exact science; therefore, there is also the possibility of unforeseen or unpredictable risks and/or possible complications that may result in a catastrophic outcome. The patient indicated having understood very clearly. We have given the patient no guarantees and we have made no promises. Enough time was given to the patient to ask questions, all of which were answered to the patient's satisfaction. Ms. Brinton has indicated that she wanted to continue with the procedure. Attestation: I, the ordering provider, attest that I have discussed with the patient the benefits, risks, side-effects, alternatives, likelihood of achieving goals, and potential problems during recovery for the procedure that I have provided informed consent. Date  Time: 07/01/2020  7:54  AM  Pre-Procedure Preparation:  Monitoring: As per clinic protocol. Respiration, ETCO2, SpO2, BP, heart rate and rhythm monitor placed and checked for adequate function Safety Precautions: Patient was assessed for positional comfort and pressure points before starting the procedure. Time-out: I initiated and conducted the "Time-out" before starting the procedure, as per protocol. The patient was asked to participate by confirming the accuracy of the "Time Out" information. Verification of the correct person, site, and procedure were performed and confirmed by me, the nursing staff, and the patient. "Time-out" conducted as per Joint Commission's Universal Protocol (UP.01.01.01). Time: 9629  Description of Procedure #1:   Time-out: "Time-out" completed before starting procedure, as per protocol. Area Prepped: Entire Posterior Lumbosacral Region DuraPrep (Iodine Povacrylex [0.7% available iodine] and Isopropyl Alcohol, 74% w/w) Safety Precautions: Aspiration looking for blood return was conducted prior to all injections. At no point did we inject any substances, as a needle was being advanced. No attempts were made at seeking any paresthesias. Safe injection practices and needle disposal techniques used. Medications properly checked for expiration dates. SDV (single dose vial) medications used.  Description of the Procedure: Protocol guidelines were followed. The patient was placed in position over the fluoroscopy table. The target area was identified and the area prepped in the usual manner. Skin & deeper tissues infiltrated with local anesthetic. Appropriate amount of time allowed to pass for local anesthetics to take effect. The procedure needle was introduced through the skin, ipsilateral to the reported pain, and advanced to the target area. Employing the "Medial Branch Technique", the needles were advanced to the angle made by the superior and medial portion of the transverse process, and the lateral  and inferior portion of the superior articulating process of the targeted vertebral bodies. This area is known as "Burton's Eye" or the "Eye of the [place] Dog". A procedure needle was introduced through the skin, and this time advanced to the angle made by the superior and medial border of the sacral ala, and the lateral border of the S1 vertebral body. This last needle was later repositioned at the superior and lateral border of the posterior S1 foramen. Negative aspiration confirmed. Solution injected in intermittent fashion, asking for systemic symptoms every 0.5cc of injectate. The needles were then removed and the area cleansed, making  sure to leave some of the prepping solution back to take advantage of its long term bactericidal properties. Start Time: 0839 hrs. Materials:  Needle(s) Type: Spinal Needle Gauge: 22G Length: 3.5-in Medication(s): Please see orders for medications and dosing details.  Description of Procedure # 2:   Area Prepped: Entire Posterior Lumbosacral Region DuraPrep (Iodine Povacrylex [0.7% available iodine] and Isopropyl Alcohol, 74% w/w) Safety Precautions: Aspiration looking for blood return was conducted prior to all injections. At no point did we inject any substances, as a needle was being advanced. No attempts were made at seeking any paresthesias. Safe injection practices and needle disposal techniques used. Medications properly checked for expiration dates. SDV (single dose vial) medications used. Description of the Procedure: Protocol guidelines were followed. The patient was placed in position over the fluoroscopy table. The target area was identified and the area prepped in the usual manner. Skin & deeper tissues infiltrated with local anesthetic. Appropriate amount of time allowed to pass for local anesthetics to take effect. The procedure needle was advanced under fluoroscopic guidance into the sacroiliac joint until a firm endpoint was obtained. Proper needle  placement secured. Negative aspiration confirmed. Solution injected in intermittent fashion, asking for systemic symptoms every 0.5cc of injectate. The needles were then removed and the area cleansed, making sure to leave some of the prepping solution back to take advantage of its long term bactericidal properties. Vitals:   07/01/20 0852 07/01/20 0902 07/01/20 0912 07/01/20 0922  BP: (!) 124/102 (!) 135/101 (!) 155/114 (!) 139/97  Pulse:      Resp: 18 18 18 19   Temp:      SpO2: 91% 97% 96% 98%  Weight:      Height:        End Time: 0852 hrs. Materials:  Needle(s) Type: Spinal Needle Gauge: 22G Length: 3.5-in Medication(s): Please see orders for medications and dosing details.  Imaging Guidance (Spinal):          Type of Imaging Technique: Fluoroscopy Guidance (Spinal) Indication(s): Assistance in needle guidance and placement for procedures requiring needle placement in or near specific anatomical locations not easily accessible without such assistance. Exposure Time: Please see nurses notes. Contrast: None used. Fluoroscopic Guidance: I was personally present during the use of fluoroscopy. "Tunnel Vision Technique" used to obtain the best possible view of the target area. Parallax error corrected before commencing the procedure. "Direction-depth-direction" technique used to introduce the needle under continuous pulsed fluoroscopy. Once target was reached, antero-posterior, oblique, and lateral fluoroscopic projection used confirm needle placement in all planes. Images permanently stored in EMR. Interpretation: No contrast injected. I personally interpreted the imaging intraoperatively. Adequate needle placement confirmed in multiple planes. Permanent images saved into the patient's record.  Antibiotic Prophylaxis:   Anti-infectives (From admission, onward)   None     Indication(s): None identified  Post-operative Assessment:  Post-procedure Vital Signs:  Pulse/HCG Rate:  9592 Temp: (!) 97.3 F (36.3 C) Resp: 19 BP: (!) 139/97 SpO2: 98 %  EBL: None  Complications: No immediate post-treatment complications observed by team, or reported by patient.  Note: The patient tolerated the entire procedure well. A repeat set of vitals were taken after the procedure and the patient was kept under observation following institutional policy, for this type of procedure. Post-procedural neurological assessment was performed, showing return to baseline, prior to discharge. The patient was provided with post-procedure discharge instructions, including a section on how to identify potential problems. Should any problems arise concerning this procedure, the patient was given  instructions to immediately contact us, at any time, without hesitation. In any case, we plan to contact the patient by telephone for a follow-up status report regarding this interventional procedure.  Comments:  No additional relevant information.  Plan of Care  Orders:  Orders Placed This Encounter  Procedures  . LUMBAR FACET(MEDIAL BRANCH NERVE BLOCK) MBNB    Scheduling Instructions:     Procedure: Lumbar facet block (AKA.: Lumbosacral medial branch nerve block)     Side: Bilateral     Level: L3-4, L4-5, & L5-S1 Facets (L2, L3, L4, L5, & S1 Medial Branch Nerves)     Sedation: Patient's choice.     Timeframe: Today    Order Specific Question:   Where will this procedure be performed?    Answer:   [facility]  . SACROILIAC JOINT INJECTION    Scheduling Instructions:     Side: Bilateral     Sedation: Patient's choice.     Timeframe: Today    Order Specific Question:   Where will this procedure be performed?    Answer:   [facility]  . DG PAIN CLINIC C-ARM 1-60 MIN NO REPORT    Intraoperative interpretation by procedural physician at [facility].    Standing Status:   Standing    Number of Occurrences:   1    Order Specific Question:   Reason for exam:    Answer:    Assistance in needle guidance and placement for procedures requiring needle placement in or near specific anatomical locations not easily accessible without such assistance.  . Informed Consent Details: Physician/Practitioner Attestation; Transcribe to consent form and obtain patient signature    Nursing Order: Transcribe to consent form and obtain patient signature. Note: Always confirm laterality of pain with Ms. Manson Passey, before procedure. Procedure: Lumbar Facet Block  under fluoroscopic guidance Indication/Reason: Low Back Pain, with our without leg pain, due to Facet Joint Arthralgia (Joint Pain) known as Lumbar Facet Syndrome, secondary to Lumbar, and/or Lumbosacral Spondylosis (Arthritis of the Spine), without myelopathy or radiculopathy (Nerve Damage). Provider Attestation: I, Marge Vandermeulen A. Laban Emperor, MD, (Pain Management Specialist), the physician/practitioner, attest that I have discussed with the patient the benefits, risks, side effects, alternatives, likelihood of achieving goals and potential problems during recovery for the procedure that I have provided informed consent.  . Informed Consent Details: Physician/Practitioner Attestation; Transcribe to consent form and obtain patient signature    Provider Attestation: I, Galina Haddox A. Laban Emperor, MD, (Pain Management Specialist), the physician/practitioner, attest that I have discussed with the patient the benefits, risks, side effects, alternatives, likelihood of achieving goals and potential problems during recovery for the procedure that I have provided informed consent.    Scheduling Instructions:     Procedure: Sacroiliac Joint Block     Indication/Reason: Chronic Low Back and Hip Pain secondary to Sacroiliac Joint Pain (Arthralgia/Arthropathy)     Nursing Order: Transcribe to consent form and obtain patient signature.     Note: Always confirm laterality of pain with Ms. Manson Passey, before procedure.  . Provide equipment / supplies at bedside     Equipment required: Single use, disposable, "Block Tray"    Standing Status:   Standing    Number of Occurrences:   1    Order Specific Question:   Specify    Answer:   Block Tray  . Miscellanous precautions    Standing Status:   Standing    Number of Occurrences:   1   Chronic Opioid Analgesic:  Abnormal  UDS (+) THC. D/C - Oxycodone IR 10 mg 1 tab PO q8 hrs (30 mg/day of oxycodone) MME/day:45 MME/day.   Medications ordered for procedure: Meds ordered this encounter  Medications  . lidocaine (XYLOCAINE) 2 % (with pres) injection 400 mg  . lactated ringers infusion 1,000 mL  . midazolam (VERSED) 5 MG/5ML injection 1-2 mg    Make sure Flumazenil is available in the pyxis when using this medication. If oversedation occurs, administer 0.2 mg IV over 15 sec. If after 45 sec no response, administer 0.2 mg again over 1 min; may repeat at 1 min intervals; not to exceed 4 doses (1 mg)  . diphenhydrAMINE (BENADRYL) injection 25 mg  . ropivacaine (PF) 2 mg/mL (0.2%) (NAROPIN) injection 18 mL  . triamcinolone acetonide (KENALOG-40) injection 80 mg  . methylPREDNISolone acetate (DEPO-MEDROL) injection 80 mg  . ropivacaine (PF) 2 mg/mL (0.2%) (NAROPIN) injection 9 mL  . oxyCODONE (OXY IR/ROXICODONE) 5 MG immediate release tablet    Sig: Take 1 tablet (5 mg total) by mouth 5 (five) times daily for 7 days. Max: 5/day. Must last 7 days.    Dispense:  35 tablet    Refill:  0    Downward opioid taper. To avoid withdrawal, fill prescriptions in the exact order as they were intended.  Marland Kitchen oxyCODONE (OXY IR/ROXICODONE) 5 MG immediate release tablet    Sig: Take 1 tablet (5 mg total) by mouth 4 (four) times daily for 7 days. Max: 4/day. Must last 7 days.    Dispense:  28 tablet    Refill:  0    Downward opioid taper. To avoid withdrawal, fill prescriptions in the exact order as they were intended.  Marland Kitchen oxyCODONE (OXY IR/ROXICODONE) 5 MG immediate release tablet    Sig: Take 1 tablet (5 mg total) by mouth 3  (three) times daily for 7 days. Max: 3/day. Must last 7 days.    Dispense:  21 tablet    Refill:  0    Downward opioid taper. To avoid withdrawal, fill prescriptions in the exact order as they were intended.  Marland Kitchen oxyCODONE (OXY IR/ROXICODONE) 5 MG immediate release tablet    Sig: Take 1 tablet (5 mg total) by mouth 2 (two) times daily for 7 days. Max: 2/day. Must last 7 days.    Dispense:  14 tablet    Refill:  0    Downward opioid taper. To avoid withdrawal, fill prescriptions in the exact order as they were intended.  Marland Kitchen oxyCODONE (OXY IR/ROXICODONE) 5 MG immediate release tablet    Sig: Take 1 tablet (5 mg total) by mouth daily for 7 days. Max: 1/day. Must last 7 days.    Dispense:  7 tablet    Refill:  0    Downward opioid taper. To avoid withdrawal, fill prescriptions in the exact order as they were intended.   Medications administered: We administered lidocaine, lactated ringers, midazolam, diphenhydrAMINE, ropivacaine (PF) 2 mg/mL (0.2%), triamcinolone acetonide, methylPREDNISolone acetate, and ropivacaine (PF) 2 mg/mL (0.2%).  See the medical record for exact dosing, route, and time of administration.  Follow-up plan:   Return in about 2 weeks (around 07/15/2020) for VV(15-min), (PP), pm on procedure day.       Interventional treatment options:  Under consideration: NOTE: CONTRAST & FENTANYL Allergy. Diagnostic left L4 TFESI #1 Diagnostic/therapeutic right L5-S1 IA synovial cyst facet joint injection #1  Diagnostic bilateral L5 TFESI #1  Possible bilateral occipital nerve RFA Possible bilateral occipital nerve peripheral nerves stimulator trial Diagnostic bilateral  cervical facet block Possible bilateral cervical facet RFA Diagnostic right CESI Diagnostic bilateral suprascapular NB Possible bilateral suprascapular nerve RFA Diagnostic right LESI Diagnostic left IA hip joint injection Diagnostic bilateral femoral + obturator NB Possible bilateral femoral +  obturator nerve RFA Diagnostic bilateral genicular NB Possible bilateral genicular RFA   Therapeutic/palliative (PRN): Diagnostic midline caudal ESI #2 (100/90/40/<50)  Diagnostic right IA hip joint injection #2  Diagnostic right trochanteric bursa injection #2  Diagnostic bilateral IA shoulder joint injection #2 (100/100/50/75)  Diagnostic right L5 TFESI #2 (100/100/97/>50)  Diagnostic left L4-5 LESI #2 (100/100/97/>50)  Palliative bilateral lumbar facet blocks #5 (100/100/90/50)  Palliative right lumbar facet RFA #2 (last done 01/16/2019) Palliative left lumbar facet RFA #2 (last done 01/30/2019) Diagnostic bilateral GONB#2(100/0/25/50)  Palliative bilateral IA knee joint injection (w/ steroid)  Palliative bilateral IA Hyalgan knee injections(series#2) (series#1completed on 07/04/2018)  Palliative bilateral SI joint block #2     Recent Visits Date Type Provider Dept  06/17/20 Office Visit Delano Metz, MD [facility]  Showing recent visits within past 90 days and meeting all other requirements Today's Visits Date Type Provider Dept  07/01/20 Procedure visit Delano Metz, MD [facility]  Showing today's visits and meeting all other requirements Future Appointments Date Type Provider Dept  07/20/20 Appointment Delano Metz, MD [facility]  Showing future appointments within next 90 days and meeting all other requirements  Disposition: Discharge home  Discharge (Date  Time): 07/01/2020; 0922 hrs.   Primary Care Physician: Sherron Monday, MD Location: [facility] Note by: Oswaldo Done, MD Date: 07/01/2020; Time: 1:17 PM  Disclaimer:  Medicine is not an Visual merchandiser. The only guarantee in medicine is that nothing is guaranteed. It is important to note that the decision to proceed with this intervention was based on the information collected from the patient. The Data and conclusions  were drawn from the patient's questionnaire, the interview, and the physical examination. Because the information was provided in large part by the patient, it cannot be guaranteed that it has not been purposely or unconsciously manipulated. Every effort has been made to obtain as much relevant data as possible for this evaluation. It is important to note that the conclusions that lead to this procedure are derived in large part from the available data. Always take into account that the treatment will also be dependent on availability of resources and existing treatment guidelines, considered by other Pain Management Practitioners as being common knowledge and practice, at the time of the intervention. For Medico-Legal purposes, it is also important to point out that variation in procedural techniques and pharmacological choices are the acceptable norm. The indications, contraindications, technique, and results of the above procedure should only be interpreted and judged by a Board-Certified Interventional Pain Specialist with extensive familiarity and expertise in the same exact procedure and technique.

## 2020-07-01 ENCOUNTER — Other Ambulatory Visit: Payer: Self-pay

## 2020-07-01 ENCOUNTER — Ambulatory Visit (HOSPITAL_BASED_OUTPATIENT_CLINIC_OR_DEPARTMENT_OTHER): Payer: Medicare Other | Admitting: Pain Medicine

## 2020-07-01 ENCOUNTER — Encounter: Payer: Self-pay | Admitting: Pain Medicine

## 2020-07-01 ENCOUNTER — Ambulatory Visit
Admission: RE | Admit: 2020-07-01 | Discharge: 2020-07-01 | Disposition: A | Discharge status: Home / Self Care | Payer: Medicare Other | Source: Ambulatory Visit | Attending: Pain Medicine | Admitting: Pain Medicine

## 2020-07-01 VITALS — BP 139/97 | HR 95 | Temp 97.3°F | Resp 19 | Ht 63.0 in | Wt 163.0 lb

## 2020-07-01 DIAGNOSIS — G8929 Other chronic pain: Secondary | ICD-10-CM | POA: Insufficient documentation

## 2020-07-01 DIAGNOSIS — M47817 Spondylosis without myelopathy or radiculopathy, lumbosacral region: Secondary | ICD-10-CM | POA: Insufficient documentation

## 2020-07-01 DIAGNOSIS — M5137 Other intervertebral disc degeneration, lumbosacral region: Secondary | ICD-10-CM | POA: Insufficient documentation

## 2020-07-01 DIAGNOSIS — M47898 Other spondylosis, sacral and sacrococcygeal region: Secondary | ICD-10-CM | POA: Insufficient documentation

## 2020-07-01 DIAGNOSIS — Z91041 Radiographic dye allergy status: Secondary | ICD-10-CM

## 2020-07-01 DIAGNOSIS — M545 Low back pain: Secondary | ICD-10-CM | POA: Diagnosis present

## 2020-07-01 DIAGNOSIS — G894 Chronic pain syndrome: Secondary | ICD-10-CM | POA: Diagnosis present

## 2020-07-01 DIAGNOSIS — M47816 Spondylosis without myelopathy or radiculopathy, lumbar region: Secondary | ICD-10-CM

## 2020-07-01 DIAGNOSIS — M533 Sacrococcygeal disorders, not elsewhere classified: Secondary | ICD-10-CM | POA: Insufficient documentation

## 2020-07-01 MED ORDER — METHYLPREDNISOLONE ACETATE 80 MG/ML IJ SUSP
80.0000 mg | Freq: Once | INTRAMUSCULAR | Status: AC
  Administered 2020-07-01: 80 mg via INTRA_ARTICULAR
  Filled 2020-07-01: qty 1

## 2020-07-01 MED ORDER — LACTATED RINGERS IV SOLN
1000.0000 mL | Freq: Once | INTRAVENOUS | Status: AC
  Administered 2020-07-01: 1000 mL via INTRAVENOUS

## 2020-07-01 MED ORDER — DIPHENHYDRAMINE HCL 50 MG/ML IJ SOLN
25.0000 mg | INTRAMUSCULAR | Status: DC | PRN
  Administered 2020-07-01: 25 mg via INTRAVENOUS
  Filled 2020-07-01: qty 1

## 2020-07-01 MED ORDER — ROPIVACAINE HCL 2 MG/ML IJ SOLN
9.0000 mL | Freq: Once | INTRAMUSCULAR | Status: AC
  Administered 2020-07-01: 9 mL via INTRA_ARTICULAR
  Filled 2020-07-01: qty 10

## 2020-07-01 MED ORDER — OXYCODONE HCL 5 MG PO TABS: 5.0000 mg | ORAL_TABLET | Freq: Every day | ORAL | 0 refills | Status: DC

## 2020-07-01 MED ORDER — TRIAMCINOLONE ACETONIDE 40 MG/ML IJ SUSP
80.0000 mg | Freq: Once | INTRAMUSCULAR | Status: AC
  Administered 2020-07-01: 40 mg
  Filled 2020-07-01: qty 2

## 2020-07-01 MED ORDER — OXYCODONE HCL 5 MG PO TABS: 5.0000 mg | ORAL_TABLET | Freq: Four times a day (QID) | ORAL | 0 refills | Status: DC

## 2020-07-01 MED ORDER — OXYCODONE HCL 5 MG PO TABS: 5.0000 mg | ORAL_TABLET | Freq: Three times a day (TID) | ORAL | 0 refills | Status: DC

## 2020-07-01 MED ORDER — OXYCODONE HCL 5 MG PO TABS: 5.0000 mg | ORAL_TABLET | Freq: Two times a day (BID) | ORAL | 0 refills | Status: DC

## 2020-07-01 MED ORDER — LIDOCAINE HCL 2 % IJ SOLN
20.0000 mL | Freq: Once | INTRAMUSCULAR | Status: AC
  Administered 2020-07-01: 400 mg
  Filled 2020-07-01: qty 20

## 2020-07-01 MED ORDER — MIDAZOLAM HCL 5 MG/5ML IJ SOLN
1.0000 mg | INTRAMUSCULAR | Status: DC | PRN
  Administered 2020-07-01: 2 mg via INTRAVENOUS
  Filled 2020-07-01: qty 5

## 2020-07-01 MED ORDER — ROPIVACAINE HCL 2 MG/ML IJ SOLN
18.0000 mL | Freq: Once | INTRAMUSCULAR | Status: AC
  Administered 2020-07-01: 18 mL via PERINEURAL
  Filled 2020-07-01: qty 20

## 2020-07-01 NOTE — Progress Notes (Signed)
Safety precautions to be maintained throughout the outpatient stay will include: orient to surroundings, keep bed in low position, maintain call bell within reach at all times, provide assistance with transfer out of bed and ambulation.  

## 2020-07-01 NOTE — Patient Instructions (Addendum)
____________________________________________________________________________________________  Post-Procedure Discharge Instructions  Instructions:  Apply ice:   Purpose: This will minimize any swelling and discomfort after procedure.   When: Day of procedure, as soon as you get home.  How: Fill a plastic sandwich bag with crushed ice. Cover it with a small towel and apply to injection site.  How long: (15 min on, 15 min off) Apply for 15 minutes then remove x 15 minutes.  Repeat sequence on day of procedure, until you go to bed.  Apply heat:   Purpose: To treat any soreness and discomfort from the procedure.  When: Starting the next day after the procedure.  How: Apply heat to procedure site starting the day following the procedure.  How long: May continue to repeat daily, until discomfort goes away.  Food intake: Start with clear liquids (like water) and advance to regular food, as tolerated.   Physical activities: Keep activities to a minimum for the first 8 hours after the procedure. After that, then as tolerated.  Driving: If you have received any sedation, be responsible and do not drive. You are not allowed to drive for 24 hours after having sedation.  Blood thinner: (Applies only to those taking blood thinners) You may restart your blood thinner 6 hours after your procedure.  Insulin: (Applies only to Diabetic patients taking insulin) As soon as you can eat, you may resume your normal dosing schedule.  Infection prevention: Keep procedure site clean and dry. Shower daily and clean area with soap and water.  Post-procedure Pain Diary: Extremely important that this be done correctly and accurately. Recorded information will be used to determine the next step in treatment. For the purpose of accuracy, follow these rules:  Evaluate only the area treated. Do not report or include pain from an untreated area. For the purpose of this evaluation, ignore all other areas of pain,  except for the treated area.  After your procedure, avoid taking a long nap and attempting to complete the pain diary after you wake up. Instead, set your alarm clock to go off every hour, on the hour, for the initial 8 hours after the procedure. Document the duration of the numbing medicine, and the relief you are getting from it.  Do not go to sleep and attempt to complete it later. It will not be accurate. If you received sedation, it is likely that you were given a medication that may cause amnesia. Because of this, completing the diary at a later time may cause the information to be inaccurate. This information is needed to plan your care.  Follow-up appointment: Keep your post-procedure follow-up evaluation appointment after the procedure (usually 2 weeks for most procedures, 6 weeks for radiofrequencies). DO NOT FORGET to bring you pain diary with you.   Expect: (What should I expect to see with my procedure?)  From numbing medicine (AKA: Local Anesthetics): Numbness or decrease in pain. You may also experience some weakness, which if present, could last for the duration of the local anesthetic.  Onset: Full effect within 15 minutes of injected.  Duration: It will depend on the type of local anesthetic used. On the average, 1 to 8 hours.   From steroids (Applies only if steroids were used): Decrease in swelling or inflammation. Once inflammation is improved, relief of the pain will follow.  Onset of benefits: Depends on the amount of swelling present. The more swelling, the longer it will take for the benefits to be seen. In some cases, up to 10 days.    Duration: Steroids will stay in the system x 2 weeks. Duration of benefits will depend on multiple posibilities including persistent irritating factors.  Side-effects: If present, they may typically last 2 weeks (the duration of the steroids).  Frequent: Cramps (if they occur, drink Gatorade and take over-the-counter Magnesium 450-500 mg  once to twice a day); water retention with temporary weight gain; increases in blood sugar; decreased immune system response; increased appetite.  Occasional: Facial flushing (red, warm cheeks); mood swings; menstrual changes.  Uncommon: Long-term decrease or suppression of natural hormones; bone thinning. (These are more common with higher doses or more frequent use. This is why we prefer that our patients avoid having any injection therapies in other practices.)   Very Rare: Severe mood changes; psychosis; aseptic necrosis.  From procedure: Some discomfort is to be expected once the numbing medicine wears off. This should be minimal if ice and heat are applied as instructed.  Call if: (When should I call?)  You experience numbness and weakness that gets worse with time, as opposed to wearing off.  New onset bowel or bladder incontinence. (Applies only to procedures done in the spine)  Emergency Numbers:  Durning business hours (Monday - Thursday, 8:00 AM - 4:00 PM) (Friday, 9:00 AM - 12:00 Noon): [phone]  After hours: [phone]  NOTE: If you are having a problem and are unable connect with, or to talk to a provider, then go to your nearest urgent care or emergency department. If the problem is serious and urgent, please call 911. ____________________________________________________________________________________________   ____________________________________________________________________________________________  Drug Holidays (Slow)  What is a "Drug Holiday"? Drug Holiday: is the name given to the period of time during which a patient stops taking a medication(s) for the purpose of eliminating tolerance to the drug.  Benefits . Improved effectiveness of opioids. . Decreased opioid dose needed to achieve benefits. . Improved pain with lesser dose.  What is tolerance? Tolerance: is the progressive decreased in effectiveness of a drug due to its repetitive use.  With repetitive use, the body gets use to the medication and as a consequence, it loses its effectiveness. This is a common problem seen with opioid pain medications. As a result, a larger dose of the drug is needed to achieve the same effect that used to be obtained with a smaller dose.  How long should a "Drug Holiday" last? You should stay off of the pain medicine for at least 14 consecutive days. (2 weeks)  Should I stop the medicine "cold [place]"? No. You should always coordinate with your Pain Specialist so that he/she can provide you with the correct medication dose to make the transition as smoothly as possible.  How do I stop the medicine? Slowly. You will be instructed to decrease the daily amount of pills that you take by one (1) pill every seven (7) days. This is called a "slow downward taper" of your dose. For example: if you normally take four (4) pills per day, you will be asked to drop this dose to three (3) pills per day for seven (7) days, then to two (2) pills per day for seven (7) days, then to one (1) per day for seven (7) days, and at the end of those last seven (7) days, this is when the "Drug Holiday" would start.   Will I have withdrawals? By doing a "slow downward taper" like this one, it is unlikely that you will experience any significant withdrawal symptoms. Typically, what triggers withdrawals is the  sudden stop of a high dose opioid therapy. Withdrawals can usually be avoided by slowly decreasing the dose over a prolonged period of time.  What are withdrawals? Withdrawals: refers to the wide range of symptoms that occur after stopping or dramatically reducing opiate drugs after heavy and prolonged use. Withdrawal symptoms do not occur to patients that use low dose opioids, or those who take the medication sporadically. Contrary to benzodiazepine (example: Valium, Xanax, etc.) or alcohol withdrawals ("Delirium Tremens"), opioid withdrawals are not lethal. Withdrawals are  the physical manifestation of the body getting rid of the excess receptors.  Expected Symptoms Early symptoms of withdrawal may include: . Agitation . Anxiety . Muscle aches . Increased tearing . Insomnia . Runny nose . Sweating . Yawning  Late symptoms of withdrawal may include: . Abdominal cramping . Diarrhea . Dilated pupils . Goose bumps . Nausea . Vomiting  Will I experience withdrawals? Due to the slow nature of the taper, it is very unlikely that you will experience any.  What is a slow taper? Taper: refers to the gradual decrease in dose.  ___________________________________________________________________________________________    ____________________________________________________________________________________________  Medication Rules  Purpose: To inform patients, and their family members, of our rules and regulations.  Applies to: All patients receiving prescriptions (written or electronic).  Pharmacy of record: Pharmacy where electronic prescriptions will be sent. If written prescriptions are taken to a different pharmacy, please inform the nursing staff. The pharmacy listed in the electronic medical record should be the one where you would like electronic prescriptions to be sent.  Electronic prescriptions: In compliance with the [facility] Strengthen Opioid Misuse Prevention (STOP) Act of 2017 (Session Conni [place] [phone]), effective December 18, 2018, all controlled substances must be electronically prescribed. Calling prescriptions to the pharmacy will cease to exist.  Prescription refills: Only during scheduled appointments. Applies to all prescriptions.  NOTE: The following applies primarily to controlled substances (Opioid* Pain Medications).   Type of encounter (visit): For patients receiving controlled substances, face-to-face visits are required. (Not an option or up to the patient.)  Patient's responsibilities: 1. Pain Pills: Bring all pain  pills to every appointment (except for procedure appointments). 2. Pill Bottles: Bring pills in original pharmacy bottle. Always bring the newest bottle. Bring bottle, even if empty. 3. Medication refills: You are responsible for knowing and keeping track of what medications you take and those you need refilled. The day before your appointment: write a list of all prescriptions that need to be refilled. The day of the appointment: give the list to the admitting nurse. Prescriptions will be written only during appointments. No prescriptions will be written on procedure days. If you forget a medication: it will not be "Called in", "Faxed", or "electronically sent". You will need to get another appointment to get these prescribed. No early refills. Do not call asking to have your prescription filled early. 4. Prescription Accuracy: You are responsible for carefully inspecting your prescriptions before leaving our office. Have the discharge nurse carefully go over each prescription with you, before taking them home. Make sure that your name is accurately spelled, that your address is correct. Check the name and dose of your medication to make sure it is accurate. Check the number of pills, and the written instructions to make sure they are clear and accurate. Make sure that you are given enough medication to last until your next medication refill appointment. 5. Taking Medication: Take medication as prescribed. When it comes to controlled substances, taking less pills or less  frequently than prescribed is permitted and encouraged. Never take more pills than instructed. Never take medication more frequently than prescribed.  6. Inform other Doctors: Always inform, all of your healthcare providers, of all the medications you take. 7. Pain Medication from other Providers: You are not allowed to accept any additional pain medication from any other Doctor or Healthcare provider. There are two exceptions to this  rule. (see below) In the event that you require additional pain medication, you are responsible for notifying us, as stated below. 8. Medication Agreement: You are responsible for carefully reading and following our Medication Agreement. This must be signed before receiving any prescriptions from our practice. Safely store a copy of your signed Agreement. Violations to the Agreement will result in no further prescriptions. (Additional copies of our Medication Agreement are available upon request.) 9. Laws, Rules, & Regulations: All patients are expected to follow all [address] and [employer], Rules, [place] [employer]. Ignorance of the Laws does not constitute a valid excuse.  10. Illegal drugs and Controlled Substances: The use of illegal substances (including, but not limited to marijuana and its derivatives) and/or the illegal use of any controlled substances is strictly prohibited. Violation of this rule may result in the immediate and permanent discontinuation of any and all prescriptions being written by our practice. The use of any illegal substances is prohibited. 11. Adopted CDC guidelines & recommendations: Target dosing levels will be at or below 60 MME/day. Use of benzodiazepines** is not recommended.  Exceptions: There are only two exceptions to the rule of not receiving pain medications from other Healthcare Providers. 1. Exception #1 (Emergencies): In the event of an emergency (i.e.: accident requiring emergency care), you are allowed to receive additional pain medication. However, you are responsible for: As soon as you are able, call our office [phone], at any time of the day or night, and leave a message stating your name, the date and nature of the emergency, and the name and dose of the medication prescribed. In the event that your call is answered by a member of our staff, make sure to document and save the date, time, and the name of the person that took your information.   2. Exception #2 (Planned Surgery): In the event that you are scheduled by another doctor or dentist to have any type of surgery or procedure, you are allowed (for a period no longer than 30 days), to receive additional pain medication, for the acute post-op pain. However, in this case, you are responsible for picking up a copy of our "Post-op Pain Management for Surgeons" handout, and giving it to your surgeon or dentist. This document is available at our office, and does not require an appointment to obtain it. Simply go to our office during business hours (Monday-Thursday from 8:00 AM to 4:00 PM) (Friday 8:00 AM to 12:00 Noon) or if you have a scheduled appointment with [place], prior to your surgery, and ask for it by name. In addition, you will need to provide [place] with your name, name of your surgeon, type of surgery, and date of procedure or surgery.  *Opioid medications include: morphine, codeine, oxycodone, oxymorphone, hydrocodone, hydromorphone, meperidine, tramadol, tapentadol, buprenorphine, fentanyl, methadone. **Benzodiazepine medications include: diazepam (Valium), alprazolam (Xanax), clonazepam (Klonopine), lorazepam (Ativan), clorazepate (Tranxene), chlordiazepoxide (Librium), estazolam (Prosom), oxazepam (Serax), temazepam (Restoril), triazolam (Halcion) (Last updated: 02/14/2018) ____________________________________________________________________________________________   ____________________________________________________________________________________________  Medication Recommendations and Reminders  Applies to: All patients receiving prescriptions (written and/or electronic).  Medication Rules & Regulations:  These rules and regulations exist for your safety and that of others. They are not flexible and neither are we. Dismissing or ignoring them will be considered "non-compliance" with medication therapy, resulting in complete and irreversible termination of such therapy. (See  document titled "Medication Rules" for more details.) In all conscience, because of safety reasons, we cannot continue providing a therapy where the patient does not follow instructions.  Pharmacy of record:   Definition: This is the pharmacy where your electronic prescriptions will be sent.   We do not endorse any particular pharmacy.  You are not restricted in your choice of pharmacy.  The pharmacy listed in the electronic medical record should be the one where you want electronic prescriptions to be sent.  If you choose to change pharmacy, simply notify our nursing staff of your choice of new pharmacy.  Recommendations:  Keep all of your pain medications in a safe place, under lock and key, even if you live alone.   After you fill your prescription, take 1 week's worth of pills and put them away in a safe place. You should keep a separate, properly labeled bottle for this purpose. The remainder should be kept in the original bottle. Use this as your primary supply, until it runs out. Once it's gone, then you know that you have 1 week's worth of medicine, and it is time to come in for a prescription refill. If you do this correctly, it is unlikely that you will ever run out of medicine.  To make sure that the above recommendation works, it is very important that you make sure your medication refill appointments are scheduled at least 1 week before you run out of medicine. To do this in an effective manner, make sure that you do not leave the office without scheduling your next medication management appointment. Always ask the nursing staff to show you in your prescription , when your medication will be running out. Then arrange for the receptionist to get you a return appointment, at least 7 days before you run out of medicine. Do not wait until you have 1 or 2 pills left, to come in. This is very poor planning and does not take into consideration that we may need to cancel appointments due to  bad weather, sickness, or emergencies affecting our staff.  "Partial Fill": If for any reason your pharmacy does not have enough pills/tablets to completely fill or refill your prescription, do not allow for a "partial fill". You will need a separate prescription to fill the remaining amount, which we will not provide. If the reason for the partial fill is your insurance, you will need to talk to the pharmacist about payment alternatives for the remaining tablets, but again, do not accept a partial fill.  Prescription refills and/or changes in medication(s):   Prescription refills, and/or changes in dose or medication, will be conducted only during scheduled medication management appointments. (Applies to both, written and electronic prescriptions.)  No refills on procedure days. No medication will be changed or started on procedure days. No changes, adjustments, and/or refills will be conducted on a procedure day. Doing so will interfere with the diagnostic portion of the procedure.  No phone refills. No medications will be "called into the pharmacy".  No Fax refills.  No weekend refills.  No Holliday refills.  No after hours refills.  Remember:  Business hours are:  Monday to Thursday 8:00 AM to 4:00 PM Provider's Schedule: Delano MetzFrancisco Wilhelm Ganaway, MD - Appointments are:  Medication management: Monday and Wednesday 8:00 AM to 4:00 PM Procedure day: Tuesday and Thursday 7:30 AM to 4:00 PM Edward Jolly, MD - Appointments are:  Medication management: Tuesday and Thursday 8:00 AM to 4:00 PM Procedure day: Monday and Wednesday 7:30 AM to 4:00 PM (Last update: 02/14/2018) ____________________________________________________________________________________________   ____________________________________________________________________________________________  CANNABIDIOL (AKA: CBD Oil or Pills)  Applies to: All patients receiving prescriptions of controlled substances (written and/or  electronic).  General Information: Cannabidiol (CBD) was discovered in [birth year]. It is one of some 113 identified cannabinoids in cannabis (Marijuana) plants, accounting for up to 40% of the plant's extract. As of 2018, preliminary clinical research on cannabidiol included studies of anxiety, cognition, movement disorders, and pain.  Cannabidiol is consummed in multiple ways, including inhalation of cannabis smoke or vapor, as an aerosol spray into the cheek, and by mouth. It may be supplied as CBD oil containing CBD as the active ingredient (no added tetrahydrocannabinol (THC) or terpenes), a full-plant CBD-dominant hemp extract oil, capsules, dried cannabis, or as a liquid solution. CBD is thought not have the same psychoactivity as THC, and may affect the actions of THC. Studies suggest that CBD may interact with different biological targets, including cannabinoid receptors and other neurotransmitter receptors. As of 2018 the mechanism of action for its biological effects has not been determined.  In the [place], cannabidiol has a limited approval by the Food and Drug Administration (FDA) for treatment of only two types of epilepsy disorders. The side effects of long-term use of the drug include somnolence, decreased appetite, diarrhea, fatigue, malaise, weakness, sleeping problems, and others.  CBD remains a Schedule I drug prohibited for any use.  Legality: Some manufacturers ship CBD products nationally, an illegal action which the FDA has not enforced in 2018, with CBD remaining the subject of an FDA investigational new drug evaluation, and is not considered legal as a dietary supplement or food ingredient as of December 2018. Federal illegality has made it difficult historically to conduct research on CBD. CBD is openly sold in head shops and health food stores in some states where such sales have not been explicitly legalized.  Warning: Because it is not FDA approved for general use or  treatment of pain, it is not required to undergo the same manufacturing controls as prescription drugs.  This means that the available cannabidiol (CBD) may be contaminated with THC.  If this is the case, it will trigger a positive urine drug screen (UDS) test for cannabinoids (Marijuana).  Because a positive UDS for illicit substances is a violation of our medication agreement, your opioid analgesics (pain medicine) may be permanently discontinued. (Last update: 03/07/2018) ____________________________________________________________________________________________

## 2020-07-02 ENCOUNTER — Telehealth: Payer: Self-pay

## 2020-07-02 NOTE — Telephone Encounter (Signed)
Post procedure phone call.  Call can not be completed at this time.  Unable to leave message.

## 2020-07-19 ENCOUNTER — Encounter: Payer: Self-pay | Admitting: Pain Medicine

## 2020-07-20 ENCOUNTER — Other Ambulatory Visit: Payer: Self-pay

## 2020-07-20 ENCOUNTER — Ambulatory Visit: Payer: Medicare Other | Attending: Pain Medicine | Admitting: Pain Medicine

## 2020-07-20 DIAGNOSIS — M5441 Lumbago with sciatica, right side: Secondary | ICD-10-CM | POA: Diagnosis not present

## 2020-07-20 DIAGNOSIS — G8929 Other chronic pain: Secondary | ICD-10-CM | POA: Diagnosis not present

## 2020-07-20 DIAGNOSIS — R937 Abnormal findings on diagnostic imaging of other parts of musculoskeletal system: Secondary | ICD-10-CM | POA: Diagnosis not present

## 2020-07-20 DIAGNOSIS — M5137 Other intervertebral disc degeneration, lumbosacral region: Secondary | ICD-10-CM | POA: Diagnosis not present

## 2020-07-20 DIAGNOSIS — M51379 Other intervertebral disc degeneration, lumbosacral region without mention of lumbar back pain or lower extremity pain: Secondary | ICD-10-CM

## 2020-07-20 NOTE — Progress Notes (Signed)
Patient: Jean Gomez  Service Category: E/M  Provider: Gaspar Cola, MD  DOB: 1956/09/22  DOS: 07/20/2020  Location: Office  MRN: [id number]  Setting: Ambulatory outpatient  Referring Provider: Jodi Marble, MD  Type: Established Patient  Specialty: Interventional Pain Management  PCP: Jodi Marble, MD  Location: Remote location  Delivery: TeleHealth     Virtual Encounter - Pain Management PROVIDER NOTE: Information contained herein reflects review and annotations entered in association with encounter. Interpretation of such information and data should be left to medically-trained personnel. Information provided to patient can be located elsewhere in the medical record under "Patient Instructions". Document created using STT-dictation technology, any transcriptional errors that may result from process are unintentional.    Contact & Pharmacy Preferred: [phone] Home: [phone] (home) Mobile: [phone] (mobile) E-mail: No e-mail address on record  [place], [place] - [address] [place] [address] Phone: [phone] Fax: [phone]   Pre-screening  Jean Gomez offered "in-person" vs "virtual" encounter. She indicated preferring virtual for this encounter.   Reason COVID-19*  Social distancing based on CDC and AMA recommendations.   I contacted Jean Gomez on 07/20/2020 via telephone.      I clearly identified myself as Gaspar Cola, MD. I verified that I was speaking with the correct person using two identifiers (Name: MAXX CALAWAY, and date of birth: 1956/07/04).  Consent I sought verbal advanced consent from Jean Gomez for virtual visit interactions. I informed Jean Gomez of possible security and privacy concerns, risks, and limitations associated with providing "not-in-person" medical evaluation and management services. I also informed Jean Gomez of the availability of "in-person" appointments.  Finally, I informed her that there would be a charge for the virtual visit and that she could be  personally, fully or partially, financially responsible for it. Jean Gomez expressed understanding and agreed to proceed.   Historic Elements   Jean Gomez is a 64 y.o. year old, female patient evaluated today after her last contact with our practice on 07/02/2020. Jean Gomez  has a past medical history of Acute postoperative pain (01/16/2019), Allergy, Asthma, Cerebral aneurysm, Chest pain (11/01/2016), Chronic migraine, Chronic respiratory failure ([place]), COPD (chronic obstructive pulmonary disease) ([place]), Depression, Hypertension, Osteoarthritis, Pneumonia, Sarcoid, and Seizures ([place]). She also  has a past surgical history that includes Cholecystectomy; Abdominal hysterectomy; Spinal fusion; Back surgery; Esophagogastroduodenoscopy (egd) with propofol (N/A, 08/19/2019); and Colonoscopy with propofol (N/A, 08/19/2019). Jean Gomez has a current medication list which includes the following prescription(s): albuterol, albuterol sulfate, alprazolam, amlodipine, aspirin ec, atorvastatin, betamethasone (augmented), budesonide-formoterol, buspirone, calcium carbonate, citalopram, clotrimazole-betamethasone, dexilant, diclofenac sodium, diphenhydramine, medical compression stockings, hydrochlorothiazide, hydroxyzine, ipratropium, levetiracetam, lidocaine, linaclotide, losartan, nitroglycerin, [START ON 07/29/2020] oxycodone, [START ON 08/05/2020] oxycodone, [START ON 08/12/2020] oxycodone, [START ON 08/19/2020] oxycodone, [START ON 08/26/2020] oxycodone, oxycodone hcl, pantoprazole, potassium chloride sa, promethazine, quetiapine, sucralfate, tizanidine, torsemide, trazodone, and venlafaxine. She  reports that she has never smoked. She has never used smokeless tobacco. She reports current alcohol use. She reports that she does not use drugs. Jean Gomez is allergic to contrast media [iodinated diagnostic agents], gabapentin,  labetalol, sulfabenzamide, penicillins, and pregabalin.   HPI  Today, she is being contacted for a post-procedure assessment.  The results of this last diagnostic bilateral lumbar facet block and bilateral SI joint block with suggest that the radiofrequency ablation done on 01/30/2019 and 01/16/2019 is still working and that the cause of this pain is  not within the facet joints or sacroiliac joint, but more so in an area more proximal.  Review of the patient's 2020 MRI reveals that she has pathology affecting the L2-3 and L3-4 levels, more so on the right side, which could account for the patient's pain in the lower back as well as the lower extremity.  Right now the patient indicates that most of her pain is in the lower back and it travels down both lower extremities starting in the back of the leg and then rotating towards the front to where the pain travels down to about the level of the knee, bilaterally.  She indicates that the right side is worse than the left.  She does have other pathology shown on the lumbar MRI, but pain coming from the levels below the L3-4 are not likely to be causing the pain pattern that she is experiencing at this time.  Today I have spoken to the patient about this and I have explained my thoughts on my interpretation about the last procedure.  I have proposed to her to have a bilateral transforaminal epidural steroid injection done at the L2 and L3 levels.  She understood and accepted.  Unfortunately, review of the patient's last UDS on 06/17/2020 revealed an unreported level of carboxy THC (marijuana).  This is a violation of our medication agreement and therefore today we will be terminating her opioid analgesic therapy.  We will continue to offer interventional therapies, as required.  I sent prescriptions to the pharmacy to taper her off of the opioids.  This will be done slowly at a rate of a drop of 5 mg of oxycodone per week.  According to my calculations, she should be off  of her oxycodone by 09/02/2020.  Post-Procedure Evaluation  Procedure (07/01/2020): Diagnostic/therapeutic bilateral lumbar facet block #4 + diagnostic bilateral SI joint block #1 under fluoroscopic guidance and IV sedation Pre-procedure pain level: 9/10 Post-procedure: 0/10 (100% relief)  Sedation: Sedation provided.  Effectiveness during initial hour after procedure(Ultra-Short Term Relief): 50 %.  Local anesthetic used: Long-acting (4-6 hours) Effectiveness: Defined as any analgesic benefit obtained secondary to the administration of local anesthetics. This carries significant diagnostic value as to the etiological location, or anatomical origin, of the pain. Duration of benefit is expected to coincide with the duration of the local anesthetic used.  Effectiveness during initial 4-6 hours after procedure(Short-Term Relief): 0 %.  Long-term benefit: Defined as any relief past the pharmacologic duration of the local anesthetics.  Effectiveness past the initial 6 hours after procedure(Long-Term Relief): 0 %.  Current benefits: Defined as benefit that persist at this time.   Analgesia:  No benefit Function: No benefit ROM: No benefit  Pharmacotherapy Assessment  Analgesic: Abnormal UDS (+) THC. D/C - Oxycodone IR 10 mg 1 tab PO q8 hrs (30 mg/day of oxycodone) MME/day:45 MME/day.   Monitoring: [place] PMP: PDMP reviewed during this encounter.       Pharmacotherapy: No side-effects or adverse reactions reported. Compliance: No problems identified. Effectiveness: Clinically acceptable. Plan: Refer to "POC".  UDS:  Summary  Date Value Ref Range Status  06/17/2020 Note  Final    Comment:    ==================================================================== [employer] (MW) ==================================================================== Test                             Result       Flag       Units  Drug Present and Declared for Prescription  Verification   Oxycodone                       254          EXPECTED   ng/mg creat   Oxymorphone                    2883         EXPECTED   ng/mg creat   Noroxycodone                   1986         EXPECTED   ng/mg creat   Noroxymorphone                 1073         EXPECTED   ng/mg creat    Sources of oxycodone are scheduled prescription medications.    Oxymorphone, noroxycodone, and noroxymorphone are expected    metabolites of oxycodone. Oxymorphone is also available as a    scheduled prescription medication.  Drug Present not Declared for Prescription Verification   Carboxy-THC                    3            UNEXPECTED ng/mg creat    Carboxy-THC is a metabolite of tetrahydrocannabinol (THC). Source of    THC is most commonly herbal marijuana or marijuana-based products,    but THC is also present in a scheduled prescription medication.    Trace amounts of THC can be present in hemp and cannabidiol (CBD)    products. This test is not intended to distinguish between delta-9-    tetrahydrocannabinol, the predominant form of THC in most herbal or    marijuana-based products, and delta-8-tetrahydrocannabinol.  Drug Absent but Declared for Prescription Verification   Alprazolam                     Not Detected UNEXPECTED ng/mg creat ==================================================================== Test                      Result    Flag   Units      Ref Range   Creatinine              120              mg/dL      >=20 ==================================================================== Declared Medications:  The flagging and interpretation on this report are based on the  following declared medications.  Unexpected results may arise from  inaccuracies in the declared medications.   **Note: The testing scope of this panel includes these medications:   Alprazolam (Xanax)  Oxycodone   **Note: The testing scope of this panel does not include the  following reported medications:   Albuterol  Amlodipine  (Norvasc)  Aspirin  Atorvastatin (Lipitor)  Betamethasone  Betamethasone (Lotrisone)  Budesonide (Symbicort)  Buspirone (Buspar)  Calcium  Citalopram (Celexa)  Clotrimazole (Lotrisone)  Dexlansoprazole (Dexilant)  Diclofenac (Voltaren)  Diphenhydramine (Benadryl)  Doxycycline  Formoterol (Symbicort)  Hydrochlorothiazide (Hydrodiuril)  Hydroxyzine (Atarax)  Ipratropium (Atrovent)  Levetiracetam (Keppra)  Lidocaine (Xylocaine)  Linaclotide (Linzess)  Nitroglycerin (Nitrostat)  Pantoprazole (Protonix)  Potassium (Klor-Con)  Promethazine (Phenergan)  Quetiapine (Seroquel)  Sucralfate (Carafate)  Tizanidine (Zanaflex)  Torsemide (Demadex)  Trazodone (Desyrel)  Venlafaxine (Effexor) ==================================================================== For clinical consultation, please call [phone]. ====================================================================     Laboratory Chemistry Profile  Renal Lab Results  Component Value Date   BUN 24 (H) 11/22/2019   CREATININE 1.46 (H) 11/22/2019   GFRAA 44 (L) 11/22/2019   GFRNONAA 38 (L) 11/22/2019     Hepatic Lab Results  Component Value Date   AST 20 10/08/2019   ALT 21 10/08/2019   ALBUMIN 4.4 10/08/2019   ALKPHOS 57 10/08/2019   LIPASE 19 10/08/2019     Electrolytes Lab Results  Component Value Date   NA 137 11/22/2019   K 3.7 11/22/2019   CL 102 11/22/2019   CALCIUM 9.6 11/22/2019   MG 1.9 12/20/2018     Bone Lab Results  Component Value Date   25OHVITD1 14 (L) 02/07/2018   25OHVITD2 <1.0 02/07/2018   25OHVITD3 14 02/07/2018     Inflammation (CRP: Acute Phase) (ESR: Chronic Phase) Lab Results  Component Value Date   CRP <0.8 02/07/2018   ESRSEDRATE 53 (H) 02/07/2018   LATICACIDVEN 1.6 12/18/2018       Note: Above Lab results reviewed.   Imaging  DG PAIN CLINIC C-ARM 1-60 MIN NO REPORT Fluoro was used, but no Radiologist interpretation will be provided.  Please refer to  "NOTES" tab for provider progress note.  Assessment  The primary encounter diagnosis was Abnormal MRI, lumbar spine (07/02/2019). 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  Plan of Care  Problem-specific:  No problem-specific Assessment & Plan notes found for this encounter.  Jean Gomez has a current medication list which includes the following long-term medication(s): albuterol, albuterol sulfate, amlodipine, atorvastatin, calcium carbonate, citalopram, dexilant, diclofenac sodium, diphenhydramine, hydrochlorothiazide, ipratropium, levetiracetam, lidocaine, linaclotide, losartan, nitroglycerin, [START ON 07/29/2020] oxycodone, [START ON 08/05/2020] oxycodone, [START ON 08/12/2020] oxycodone, [START ON 08/19/2020] oxycodone, [START ON 08/26/2020] oxycodone, oxycodone hcl, pantoprazole, potassium chloride sa, promethazine, sucralfate, tizanidine, and torsemide.  Pharmacotherapy (Medications Ordered): No orders of the defined types were placed in this encounter.  Orders:  Orders Placed This Encounter  Procedures  . Lumbar Transforaminal Epidural    Standing Status:   Future    Standing Expiration Date:   08/20/2020    Scheduling Instructions:     Side: Bilateral     Level: L2 & L3     Sedation: Patient's choice.     Timeframe: ASAP    Order Specific Question:   Where will this procedure be performed?    Answer:   [facility]   Follow-up plan:   Return for Procedure (w/ sedation): (B) L2 & L3 TFESI #1.      Interventional treatment options:  Under consideration: NOTE: CONTRAST & FENTANYL Allergy. Diagnostic bilateral L2 and L3 TFESI #1  Diagnostic left L4 TFESI #1 Diagnostic/therapeutic right L5-S1 IA synovial cyst facet joint injection #1  Diagnostic bilateral L5 TFESI #1   Possible bilateral occipital nerve RFA Possible bilateral occipital nerve peripheral nerves stimulator trial Diagnostic bilateral cervical facet block Possible bilateral cervical facet RFA Diagnostic right CESI Diagnostic bilateral suprascapular NB Possible bilateral suprascapular nerve RFA Diagnostic right LESI Diagnostic left IA hip joint injection Diagnostic bilateral femoral + obturator NB Possible bilateral femoral + obturator nerve RFA Diagnostic bilateral genicular NB Possible bilateral genicular RFA   Therapeutic/palliative (PRN): Diagnostic midline caudal ESI #2 (100/90/40/<50)  Diagnostic right IA hip joint injection #2  Diagnostic right trochanteric bursa injection #2  Diagnostic bilateral IA shoulder joint  injection #2 (100/100/50/75)  Diagnostic right L5 TFESI #2 (100/100/97/>50)  Diagnostic left L4-5 LESI #2 (100/100/97/>50)  Palliative bilateral lumbar facet blocks #5 (100/100/90/50)  Palliative right lumbar facet RFA #2 (last done 01/16/2019) Palliative left lumbar facet RFA #2 (last done 01/30/2019) Diagnostic bilateral GONB#2(100/0/25/50)  Palliative bilateral IA knee joint injection (w/ steroid)  Palliative bilateral IA Hyalgan knee injections(series#2) (series#1completed on 07/04/2018)  Palliative bilateral SI joint block #2     Recent Visits Date Type Provider Dept  07/01/20 Procedure visit Milinda Pointer, MD [facility]  06/17/20 Office Visit Milinda Pointer, MD [facility]  Showing recent visits within past 90 days and meeting all other requirements Today's Visits Date Type Provider Dept  07/20/20 Telemedicine Milinda Pointer, MD [facility]  Showing today's visits and meeting all other requirements Future Appointments No visits were found meeting these conditions. Showing future appointments within next 90 days and meeting all other requirements  I discussed the assessment and treatment plan  with the patient. The patient was provided an opportunity to ask questions and all were answered. The patient agreed with the plan and demonstrated an understanding of the instructions.  Patient advised to call back or seek an in-person evaluation if the symptoms or condition worsens.  Duration of encounter: 16 minutes.  Note by: Gaspar Cola, MD Date: 07/20/2020; Time: 12:18 PM

## 2020-07-20 NOTE — Patient Instructions (Signed)
____________________________________________________________________________________________  Preparing for Procedure with Sedation  Procedure appointments are limited to planned procedures: . No Prescription Refills. . No disability issues will be discussed. . No medication changes will be discussed.  Instructions: . Oral Intake: Do not eat or drink anything for at least 8 hours prior to your procedure. (Exception: Blood Pressure Medication. See below.) . Transportation: Unless otherwise stated by your physician, you may drive yourself after the procedure. . Blood Pressure Medicine: Do not forget to take your blood pressure medicine with a sip of water the morning of the procedure. If your Diastolic (lower reading)is above 100 mmHg, elective cases will be cancelled/rescheduled. . Blood thinners: These will need to be stopped for procedures. Notify our staff if you are taking any blood thinners. Depending on which one you take, there will be specific instructions on how and when to stop it. . Diabetics on insulin: Notify the staff so that you can be scheduled 1st case in the morning. If your diabetes requires high dose insulin, take only  of your normal insulin dose the morning of the procedure and notify the staff that you have done so. . Preventing infections: Shower with an antibacterial soap the morning of your procedure. . Build-up your immune system: Take 1000 mg of Vitamin C with every meal (3 times a day) the day prior to your procedure. . Antibiotics: Inform the staff if you have a condition or reason that requires you to take antibiotics before dental procedures. . Pregnancy: If you are pregnant, call and cancel the procedure. . Sickness: If you have a cold, fever, or any active infections, call and cancel the procedure. . Arrival: You must be in the facility at least 30 minutes prior to your scheduled procedure. . Children: Do not bring children with you. . Dress appropriately:  Bring dark clothing that you would not mind if they get stained. . Valuables: Do not bring any jewelry or valuables.  Reasons to call and reschedule or cancel your procedure: (Following these recommendations will minimize the risk of a serious complication.) . Surgeries: Avoid having procedures within 2 weeks of any surgery. (Avoid for 2 weeks before or after any surgery). . Flu Shots: Avoid having procedures within 2 weeks of a flu shots or . (Avoid for 2 weeks before or after immunizations). . Barium: Avoid having a procedure within 7-10 days after having had a radiological study involving the use of radiological contrast. (Myelograms, Barium swallow or enema study). . Heart attacks: Avoid any elective procedures or surgeries for the initial 6 months after a "Myocardial Infarction" (Heart Attack). . Blood thinners: It is imperative that you stop these medications before procedures. Let us know if you if you take any blood thinner.  . Infection: Avoid procedures during or within two weeks of an infection (including chest colds or gastrointestinal problems). Symptoms associated with infections include: Localized redness, fever, chills, night sweats or profuse sweating, burning sensation when voiding, cough, congestion, stuffiness, runny nose, sore throat, diarrhea, nausea, vomiting, cold or Flu symptoms, recent or current infections. It is specially important if the infection is over the area that we intend to treat. . Heart and lung problems: Symptoms that may suggest an active cardiopulmonary problem include: cough, chest pain, breathing difficulties or shortness of breath, dizziness, ankle swelling, uncontrolled high or unusually low blood pressure, and/or palpitations. If you are experiencing any of these symptoms, cancel your procedure and contact your primary care physician for an evaluation.  Remember:  Regular Business hours are:    Monday to Thursday 8:00 AM to 4:00 PM  Provider's  Schedule: Yarimar Lavis, MD:  Procedure days: Tuesday and Thursday 7:30 AM to 4:00 PM  Bilal Lateef, MD:  Procedure days: Monday and Wednesday 7:30 AM to 4:00 PM ____________________________________________________________________________________________    

## 2020-07-22 ENCOUNTER — Encounter: Payer: Self-pay | Admitting: Pain Medicine

## 2020-07-22 ENCOUNTER — Ambulatory Visit (HOSPITAL_BASED_OUTPATIENT_CLINIC_OR_DEPARTMENT_OTHER): Payer: Medicare Other | Admitting: Pain Medicine

## 2020-07-22 ENCOUNTER — Other Ambulatory Visit: Payer: Self-pay

## 2020-07-22 ENCOUNTER — Ambulatory Visit
Admission: RE | Admit: 2020-07-22 | Discharge: 2020-07-22 | Disposition: A | Discharge status: Home / Self Care | Payer: Medicare Other | Source: Ambulatory Visit | Attending: Pain Medicine | Admitting: Pain Medicine

## 2020-07-22 VITALS — BP 129/100 | HR 106 | Temp 97.3°F | Resp 18 | Ht 63.0 in | Wt 161.0 lb

## 2020-07-22 DIAGNOSIS — M25552 Pain in left hip: Secondary | ICD-10-CM | POA: Insufficient documentation

## 2020-07-22 DIAGNOSIS — M5137 Other intervertebral disc degeneration, lumbosacral region: Secondary | ICD-10-CM | POA: Diagnosis present

## 2020-07-22 DIAGNOSIS — M4727 Other spondylosis with radiculopathy, lumbosacral region: Secondary | ICD-10-CM | POA: Insufficient documentation

## 2020-07-22 DIAGNOSIS — M5441 Lumbago with sciatica, right side: Secondary | ICD-10-CM | POA: Insufficient documentation

## 2020-07-22 DIAGNOSIS — M25551 Pain in right hip: Secondary | ICD-10-CM | POA: Diagnosis present

## 2020-07-22 DIAGNOSIS — G8929 Other chronic pain: Secondary | ICD-10-CM | POA: Diagnosis present

## 2020-07-22 DIAGNOSIS — Z91041 Radiographic dye allergy status: Secondary | ICD-10-CM | POA: Insufficient documentation

## 2020-07-22 DIAGNOSIS — M48061 Spinal stenosis, lumbar region without neurogenic claudication: Secondary | ICD-10-CM

## 2020-07-22 MED ORDER — SODIUM CHLORIDE 0.9% FLUSH
2.0000 mL | Freq: Once | INTRAVENOUS | Status: AC
  Administered 2020-07-22: 2 mL

## 2020-07-22 MED ORDER — DIPHENHYDRAMINE HCL 50 MG/ML IJ SOLN
12.5000 mg | Freq: Once | INTRAMUSCULAR | Status: AC
  Administered 2020-07-22: 12.5 mg via INTRAVENOUS
  Filled 2020-07-22: qty 1

## 2020-07-22 MED ORDER — ROPIVACAINE HCL 2 MG/ML IJ SOLN
2.0000 mL | Freq: Once | INTRAMUSCULAR | Status: AC
  Administered 2020-07-22: 2 mL via EPIDURAL
  Filled 2020-07-22: qty 10

## 2020-07-22 MED ORDER — DEXAMETHASONE SODIUM PHOSPHATE 10 MG/ML IJ SOLN
20.0000 mg | Freq: Once | INTRAMUSCULAR | Status: AC
  Administered 2020-07-22: 20 mg
  Filled 2020-07-22: qty 2

## 2020-07-22 MED ORDER — DEXAMETHASONE SODIUM PHOSPHATE 10 MG/ML IJ SOLN
20.0000 mg | Freq: Once | INTRAMUSCULAR | Status: AC
  Administered 2020-07-22: 10 mg
  Filled 2020-07-22: qty 2

## 2020-07-22 MED ORDER — LIDOCAINE HCL 2 % IJ SOLN
20.0000 mL | Freq: Once | INTRAMUSCULAR | Status: AC
  Administered 2020-07-22: 400 mg

## 2020-07-22 MED ORDER — LACTATED RINGERS IV SOLN
1000.0000 mL | Freq: Once | INTRAVENOUS | Status: AC
  Administered 2020-07-22: 1000 mL via INTRAVENOUS

## 2020-07-22 MED ORDER — LIDOCAINE HCL 2 % IJ SOLN
INTRAMUSCULAR | Status: AC
  Filled 2020-07-22: qty 20

## 2020-07-22 MED ORDER — MIDAZOLAM HCL 5 MG/5ML IJ SOLN
1.0000 mg | INTRAMUSCULAR | Status: DC | PRN
  Administered 2020-07-22: 1 mg via INTRAVENOUS
  Administered 2020-07-22: 2 mg via INTRAVENOUS
  Filled 2020-07-22: qty 5

## 2020-07-22 NOTE — Progress Notes (Signed)
PROVIDER NOTE: Information contained herein reflects review and annotations entered in association with encounter. Interpretation of such information and data should be left to medically-trained personnel. Information provided to patient can be located elsewhere in the medical record under "Patient Instructions". Document created using STT-dictation technology, any transcriptional errors that may result from process are unintentional.    Patient: Jean Gomez  Service Category: Procedure  Provider: Oswaldo Done, MD  DOB: 01-Aug-1956  DOS: 07/22/2020  Location: [facility]  MRN: [id number]  Setting: Ambulatory - outpatient  Referring Provider: Sherron Monday, MD  Type: Established Patient  Specialty: Interventional Pain Management  PCP: Sherron Monday, MD   Primary Reason for Visit: Interventional Pain Management Treatment. CC: Back Pain (low)  Procedure:          Anesthesia, Analgesia, Anxiolysis:  Type: Trans-Foraminal Epidural Steroid Injection #1  Purpose: Diagnostic/Therapeutic Region: Posterolateral Lumbosacral Target Area: The 6 o'clock position under the pedicle, on the affected side. Approach: Posterior Percutaneous Paravertebral approach. Level: L2 and L3 level Laterality: Bilateral Paravertebral  Type: Moderate (Conscious) Sedation combined with Local Anesthesia Indication(s): Analgesia and Anxiety Route: Intravenous (IV) IV Access: Secured Sedation: Meaningful verbal contact was maintained at all times during the procedure  Local Anesthetic: Lidocaine 1-2%  Position: Prone   Indications: 1. DDD (degenerative disc disease), lumbosacral   2. 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 Chronic hip pain (Tertiary Area of Pain) (Bilateral) (R>L)   4. Lumbar foraminal stenosis (Bilateral: L2-S1)   5. Lumbosacral spondylosis with radiculopathy (Right)    History of allergy to radiographic contrast media     Pain Score: Pre-procedure: 8 /10 Post-procedure: 0-No pain/10   Pre-op Assessment:  Jean Gomez is a 64 y.o. (year old), female patient, seen today for interventional treatment. She  has a past surgical history that includes Cholecystectomy; Abdominal hysterectomy; Spinal fusion; Back surgery; Esophagogastroduodenoscopy (egd) with propofol (N/A, 08/19/2019); and Colonoscopy with propofol (N/A, 08/19/2019). Ms. Fullen has a current medication list which includes the following prescription(s): albuterol, albuterol sulfate, amitiza, amlodipine, aripiprazole, aspirin ec, atorvastatin, betamethasone (augmented), budesonide-formoterol, buspirone, calcium carbonate, citalopram, clotrimazole-betamethasone, dexilant, diclofenac sodium, diphenhydramine, medical compression stockings, hydrochlorothiazide, hydroxyzine, ipratropium, lactulose, levetiracetam, lidocaine, linaclotide, losartan, mirtazapine, nitroglycerin, [START ON 07/29/2020] oxycodone, [START ON 08/05/2020] oxycodone, [START ON 08/12/2020] oxycodone, [START ON 08/19/2020] oxycodone, [START ON 08/26/2020] oxycodone, oxycodone hcl, pantoprazole, potassium chloride sa, promethazine, quetiapine, sucralfate, tizanidine, torsemide, trazodone, venlafaxine, alprazolam, and levetiracetam, and the following Facility-Administered Medications: midazolam. Her primarily concern today is the Back Pain (low)  Initial Vital Signs:  Pulse/HCG Rate: (!) 106ECG Heart Rate: (!) 109 (sinus tach) Temp: (!) 97.5 F (36.4 C) Resp: 18 BP: 132/82 SpO2: 99 %  BMI: Estimated body mass index is 28.52 kg/m as calculated from the following:   Height as of this encounter: 5\' 3"  (1.6 m).   Weight as of this encounter: 161 lb (73 kg).  Risk Assessment: Allergies: Reviewed. She is allergic to contrast media [iodinated diagnostic agents], gabapentin, labetalol, sulfabenzamide, penicillins, and pregabalin.  Allergy Precautions: None required Coagulopathies: Reviewed. None identified.   Blood-thinner therapy: None at this time Active Infection(s): Reviewed. None identified. Ms. Zynda is afebrile  Site Confirmation: Ms. Soria was asked to confirm the procedure and laterality before marking the site Procedure checklist: Completed Consent: Before the procedure and under the influence of no sedative(s), amnesic(s), or anxiolytics, the patient was informed of the treatment options, risks and possible complications. To fulfill our  ethical and legal obligations, as recommended by the American Medical Association's Code of Ethics, I have informed the patient of my clinical impression; the nature and purpose of the treatment or procedure; the risks, benefits, and possible complications of the intervention; the alternatives, including doing nothing; the risk(s) and benefit(s) of the alternative treatment(s) or procedure(s); and the risk(s) and benefit(s) of doing nothing. The patient was provided information about the general risks and possible complications associated with the procedure. These may include, but are not limited to: failure to achieve desired goals, infection, bleeding, organ or nerve damage, allergic reactions, paralysis, and death. In addition, the patient was informed of those risks and complications associated to Spine-related procedures, such as failure to decrease pain; infection (i.e.: Meningitis, epidural or intraspinal abscess); bleeding (i.e.: epidural hematoma, subarachnoid hemorrhage, or any other type of intraspinal or peri-dural bleeding); organ or nerve damage (i.e.: Any type of peripheral nerve, nerve root, or spinal cord injury) with subsequent damage to sensory, motor, and/or autonomic systems, resulting in permanent pain, numbness, and/or weakness of one or several areas of the body; allergic reactions; (i.e.: anaphylactic reaction); and/or death. Furthermore, the patient was informed of those risks and complications associated with the medications. These include,  but are not limited to: allergic reactions (i.e.: anaphylactic or anaphylactoid reaction(s)); adrenal axis suppression; blood sugar elevation that in diabetics may result in ketoacidosis or comma; water retention that in patients with history of congestive heart failure may result in shortness of breath, pulmonary edema, and decompensation with resultant heart failure; weight gain; swelling or edema; medication-induced neural toxicity; particulate matter embolism and blood vessel occlusion with resultant organ, and/or nervous system infarction; and/or aseptic necrosis of one or more joints. Finally, the patient was informed that Medicine is not an exact science; therefore, there is also the possibility of unforeseen or unpredictable risks and/or possible complications that may result in a catastrophic outcome. The patient indicated having understood very clearly. We have given the patient no guarantees and we have made no promises. Enough time was given to the patient to ask questions, all of which were answered to the patient's satisfaction. Ms. Hockman has indicated that she wanted to continue with the procedure. Attestation: I, the ordering provider, attest that I have discussed with the patient the benefits, risks, side-effects, alternatives, likelihood of achieving goals, and potential problems during recovery for the procedure that I have provided informed consent. Date  Time: 07/22/2020  8:04 AM  Pre-Procedure Preparation:  Monitoring: As per clinic protocol. Respiration, ETCO2, SpO2, BP, heart rate and rhythm monitor placed and checked for adequate function Safety Precautions: Patient was assessed for positional comfort and pressure points before starting the procedure. Time-out: I initiated and conducted the "Time-out" before starting the procedure, as per protocol. The patient was asked to participate by confirming the accuracy of the "Time Out" information. Verification of the correct person, site,  and procedure were performed and confirmed by me, the nursing staff, and the patient. "Time-out" conducted as per Joint Commission's Universal Protocol (UP.01.01.01). Time: 0942  Description of Procedure:          Area Prepped: Entire Posterior Lumbosacral Area DuraPrep (Iodine Povacrylex [0.7% available iodine] and Isopropyl Alcohol, 74% w/w) Safety Precautions: Aspiration looking for blood return was conducted prior to all injections. At no point did we inject any substances, as a needle was being advanced. No attempts were made at seeking any paresthesias. Safe injection practices and needle disposal techniques used. Medications properly checked for expiration dates. SDV (  single dose vial) medications used. Description of the Procedure: Protocol guidelines were followed. The patient was placed in position over the procedure table. The target area was identified and the area prepped in the usual manner. Skin & deeper tissues infiltrated with local anesthetic. Appropriate amount of time allowed to pass for local anesthetics to take effect. The procedure needles were then advanced to the target area. Proper needle placement secured. Negative aspiration confirmed. Solution injected in intermittent fashion, asking for systemic symptoms every 0.5cc of injectate. The needles were then removed and the area cleansed, making sure to leave some of the prepping solution back to take advantage of its long term bactericidal properties.  Vitals:   07/22/20 0955 07/22/20 1005 07/22/20 1015 07/22/20 1025  BP: (!) 130/97 (!) 133/103 (!) 130/100 (!) 129/100  Pulse:      Resp:      Temp:  (!) 97.5 F (36.4 C)  (!) 97.3 F (36.3 C)  SpO2: 98% 98% 98% 98%  Weight:      Height:        Start Time: 0942 hrs. End Time: 0955 hrs.  Materials:  Needle(s) Type: Spinal Needle Gauge: 22G Length: 3.5-in Medication(s): Please see orders for medications and dosing details.  Imaging Guidance (Spinal):          Type  of Imaging Technique: Fluoroscopy Guidance (Spinal) Indication(s): Assistance in needle guidance and placement for procedures requiring needle placement in or near specific anatomical locations not easily accessible without such assistance. Exposure Time: Please see nurses notes. Contrast: Before injecting any contrast, we confirmed that the patient did not have an allergy to iodine, shellfish, or radiological contrast. Once satisfactory needle placement was completed at the desired level, radiological contrast was injected. Contrast injected under live fluoroscopy. No contrast complications. See chart for type and volume of contrast used. Fluoroscopic Guidance: I was personally present during the use of fluoroscopy. "Tunnel Vision Technique" used to obtain the best possible view of the target area. Parallax error corrected before commencing the procedure. "Direction-depth-direction" technique used to introduce the needle under continuous pulsed fluoroscopy. Once target was reached, antero-posterior, oblique, and lateral fluoroscopic projection used confirm needle placement in all planes. Images permanently stored in EMR. Interpretation: I personally interpreted the imaging intraoperatively. Adequate needle placement confirmed in multiple planes. Appropriate spread of contrast into desired area was observed. No evidence of afferent or efferent intravascular uptake. No intrathecal or subarachnoid spread observed. Permanent images saved into the patient's record.  Antibiotic Prophylaxis:   Anti-infectives (From admission, onward)   None     Indication(s): None identified  Post-operative Assessment:  Post-procedure Vital Signs:  Pulse/HCG Rate: (!) 10699 Temp: (!) 97.3 F (36.3 C) Resp: 18 BP: (!) 129/100 SpO2: 98 %  EBL: None  Complications: No immediate post-treatment complications observed by team, or reported by patient.  Note: The patient tolerated the entire procedure well. A repeat  set of vitals were taken after the procedure and the patient was kept under observation following institutional policy, for this type of procedure. Post-procedural neurological assessment was performed, showing return to baseline, prior to discharge. The patient was provided with post-procedure discharge instructions, including a section on how to identify potential problems. Should any problems arise concerning this procedure, the patient was given instructions to immediately contact us, at any time, without hesitation. In any case, we plan to contact the patient by telephone for a follow-up status report regarding this interventional procedure.  Comments:  No additional relevant information.  Plan of  Care  Orders:  Orders Placed This Encounter  Procedures  . Lumbar Transforaminal Epidural    Scheduling Instructions:     Side: Bilateral     Level: L2 & L3     Sedation: With Sedation.     Timeframe: Today    Order Specific Question:   Where will this procedure be performed?    Answer:   [facility]  . [facility] C-ARM 1-60 MIN NO REPORT    Intraoperative interpretation by procedural physician at [facility].    Standing Status:   Standing    Number of Occurrences:   1    Order Specific Question:   Reason for exam:    Answer:   Assistance in needle guidance and placement for procedures requiring needle placement in or near specific anatomical locations not easily accessible without such assistance.  . Informed Consent Details: Physician/Practitioner Attestation; Transcribe to consent form and obtain patient signature    Provider Attestation: I, Bettyjean Stefanski A. Laban Emperor, MD, (Pain Management Specialist), the physician/practitioner, attest that I have discussed with the patient the benefits, risks, side effects, alternatives, likelihood of achieving goals and potential problems during recovery for the procedure that I have provided informed consent.    Scheduling  Instructions:     Procedure: Diagnostic lumbar transforaminal epidural steroid injection under fluoroscopic guidance. (See notes for level and laterality.)     Indication/Reason: Lumbar radiculopathy/radiculitis associated with lumbar stenosis     Note: Always confirm laterality of pain with Ms. Manson Passey, before procedure.     Transcribe to consent form and obtain patient signature.  . Provide equipment / supplies at bedside    Equipment required: Single use, disposable, "Block Tray"    Standing Status:   Standing    Number of Occurrences:   1    Order Specific Question:   Specify    Answer:   Block Tray  . Miscellanous precautions    Standing Status:   Standing    Number of Occurrences:   1   Chronic Opioid Analgesic:  Abnormal UDS (+) THC. D/C - Oxycodone IR 10 mg 1 tab PO q8 hrs (30 mg/day of oxycodone) MME/day:45 MME/day.   Medications ordered for procedure: Meds ordered this encounter  Medications  . lidocaine (XYLOCAINE) 2 % (with pres) injection 400 mg  . lactated ringers infusion 1,000 mL  . midazolam (VERSED) 5 MG/5ML injection 1-2 mg    Make sure Flumazenil is available in the pyxis when using this medication. If oversedation occurs, administer 0.2 mg IV over 15 sec. If after 45 sec no response, administer 0.2 mg again over 1 min; may repeat at 1 min intervals; not to exceed 4 doses (1 mg)  . diphenhydrAMINE (BENADRYL) injection 12.5 mg  . sodium chloride flush (NS) 0.9 % injection 2 mL  . ropivacaine (PF) 2 mg/mL (0.2%) (NAROPIN) injection 2 mL  . dexamethasone (DECADRON) injection 20 mg  . sodium chloride flush (NS) 0.9 % injection 2 mL  . ropivacaine (PF) 2 mg/mL (0.2%) (NAROPIN) injection 2 mL  . dexamethasone (DECADRON) injection 20 mg   Medications administered: We administered lidocaine, lactated ringers, midazolam, diphenhydrAMINE, sodium chloride flush, ropivacaine (PF) 2 mg/mL (0.2%), dexamethasone, sodium chloride flush, ropivacaine (PF) 2 mg/mL (0.2%), and  dexamethasone.  See the medical record for exact dosing, route, and time of administration.  Follow-up plan:   Return in about 2 weeks (around 08/05/2020) for F2F encounter, 20-min, MM (on eval day).  Interventional treatment options:  Under consideration: NOTE: CONTRAST & FENTANYL Allergy. Diagnostic bilateral L2 and L3 TFESI #1  Diagnostic left L4 TFESI #1 Diagnostic/therapeutic right L5-S1 IA synovial cyst facet joint injection #1  Diagnostic bilateral L5 TFESI #1  Possible bilateral occipital nerve RFA Possible bilateral occipital nerve peripheral nerves stimulator trial Diagnostic bilateral cervical facet block Possible bilateral cervical facet RFA Diagnostic right CESI Diagnostic bilateral suprascapular NB Possible bilateral suprascapular nerve RFA Diagnostic right LESI Diagnostic left IA hip joint injection Diagnostic bilateral femoral + obturator NB Possible bilateral femoral + obturator nerve RFA Diagnostic bilateral genicular NB Possible bilateral genicular RFA   Therapeutic/palliative (PRN): Diagnostic midline caudal ESI #2 (100/90/40/<50)  Diagnostic right IA hip joint injection #2  Diagnostic right trochanteric bursa injection #2  Diagnostic bilateral IA shoulder joint injection #2 (100/100/50/75)  Diagnostic right L5 TFESI #2 (100/100/97/>50)  Diagnostic left L4-5 LESI #2 (100/100/97/>50)  Palliative bilateral lumbar facet blocks #5 (100/100/90/50)  Palliative right lumbar facet RFA #2 (last done 01/16/2019) Palliative left lumbar facet RFA #2 (last done 01/30/2019) Diagnostic bilateral GONB#2(100/0/25/50)  Palliative bilateral IA knee joint injection (w/ steroid)  Palliative bilateral IA Hyalgan knee injections(series#2) (series#1completed on 07/04/2018)  Palliative bilateral SI joint block #2      Recent Visits Date Type Provider Dept  07/20/20 Telemedicine Delano MetzNaveira, Shanyce Daris, MD [facility]  07/01/20 Procedure visit  Delano MetzNaveira, Jorryn Casagrande, MD [facility]  06/17/20 Office Visit Delano MetzNaveira, Hadasa Gasner, MD [facility]  Showing recent visits within past 90 days and meeting all other requirements Today's Visits Date Type Provider Dept  07/22/20 Procedure visit Delano MetzNaveira, Raye Slyter, MD [facility]  Showing today's visits and meeting all other requirements Future Appointments Date Type Provider Dept  08/02/20 Appointment Delano MetzNaveira, Jakim Drapeau, MD [facility]  Showing future appointments within next 90 days and meeting all other requirements  Disposition: Discharge home  Discharge (Date  Time): 07/22/2020; 1025 hrs.   Primary Care Physician: Sherron Mondayejan-[name], S Ahmed, MD [facility] Note by: Oswaldo DoneFrancisco A Saif Peter, MD Date: 07/22/2020; Time: 12:08 PM  Disclaimer:  Medicine is not an Visual merchandiserexact science. The only guarantee in medicine is that nothing is guaranteed. It is important to note that the decision to proceed with this intervention was based on the information collected from the patient. The Data and conclusions were drawn from the patient's questionnaire, the interview, and the physical examination. Because the information was provided in large part by the patient, it cannot be guaranteed that it has not been purposely or unconsciously manipulated. Every effort has been made to obtain as much relevant data as possible for this evaluation. It is important to note that the conclusions that lead to this procedure are derived in large part from the available data. Always take into account that the treatment will also be dependent on availability of resources and existing treatment guidelines, considered by other Pain Management Practitioners as being common knowledge and practice, at the time of the intervention. For Medico-Legal purposes, it is also important to point out that variation in procedural techniques and pharmacological choices are the acceptable norm.  The indications, contraindications, technique, and results of the above procedure should only be interpreted and judged by a Board-Certified Interventional Pain Specialist with extensive familiarity and expertise in the same exact procedure and technique.

## 2020-07-22 NOTE — Progress Notes (Signed)
Safety precautions to be maintained throughout the outpatient stay will include: orient to surroundings, keep bed in low position, maintain call bell within reach at all times, provide assistance with transfer out of bed and ambulation.  

## 2020-07-22 NOTE — Patient Instructions (Signed)

## 2020-07-23 ENCOUNTER — Telehealth: Payer: Self-pay

## 2020-07-23 NOTE — Telephone Encounter (Signed)
Post procedure phone call.  Call cant be completed at this time. Unable to leave a message.

## 2020-07-29 ENCOUNTER — Other Ambulatory Visit: Payer: Self-pay | Admitting: Pain Medicine

## 2020-07-29 ENCOUNTER — Telehealth: Payer: Self-pay

## 2020-07-29 DIAGNOSIS — G894 Chronic pain syndrome: Secondary | ICD-10-CM

## 2020-07-29 NOTE — Telephone Encounter (Signed)
Spoke with patient and she is concerned about her medications being "messed up" by the pharmacy.  After review of chart, I can see that she has been placed on a taper to taper her completely off the oxycodone 5mg .  Last UDS had THC levels in it and I explained that this is probably the reason as she stated she was unaware of the taper.  She does however, recall the conversation of her having marijuana in her system with Dr .  I have told her that if she feels she would like to talk to Dr Laban Emperor we could schedule her an appt with him.  She states she would very much like to do this as she can not go on this small of a dosage of pain medication with her pain levels.

## 2020-07-29 NOTE — Telephone Encounter (Signed)
There is a note on 07/01/20 0745 of Dr Laban Emperor that indicates exactly why he has initiated taper and how long it will last.  Dr Laban Emperor will continue to offer interventional therapies but will not longer offer medication management with opioids to this patient.

## 2020-07-29 NOTE — Telephone Encounter (Signed)
She states the pharmacy has messed up her prescription. She wants a nurse to call her to get this straightened out.

## 2020-08-02 ENCOUNTER — Encounter: Payer: Medicare Other | Admitting: Pain Medicine

## 2020-08-02 ENCOUNTER — Other Ambulatory Visit: Payer: Self-pay

## 2020-08-02 ENCOUNTER — Other Ambulatory Visit
Admission: RE | Admit: 2020-08-02 | Discharge: 2020-08-02 | Disposition: A | Discharge status: Home / Self Care | Payer: Medicare Other | Source: Ambulatory Visit | Attending: General Surgery | Admitting: General Surgery

## 2020-08-02 DIAGNOSIS — Z01812 Encounter for preprocedural laboratory examination: Secondary | ICD-10-CM | POA: Diagnosis present

## 2020-08-02 DIAGNOSIS — Z20822 Contact with and (suspected) exposure to covid-19: Secondary | ICD-10-CM | POA: Insufficient documentation

## 2020-08-02 LAB — SARS CORONAVIRUS 2 (TAT 6-24 HRS): SARS Coronavirus 2: NEGATIVE

## 2020-08-02 NOTE — Progress Notes (Deleted)
PROVIDER NOTE: Information contained herein reflects review and annotations entered in association with encounter. Interpretation of such information and data should be left to medically-trained personnel. Information provided to patient can be located elsewhere in the medical record under "Patient Instructions". Document created using STT-dictation technology, any transcriptional errors that may result from process are unintentional.    Patient: Jean Gomez  Service Category: E/M  Provider: Gaspar Cola, MD  DOB: 04/16/1956  DOS: 08/02/2020  Specialty: Interventional Pain Management  MRN: [id number]  Setting: Ambulatory outpatient  PCP: Jodi Marble, MD  Type: Established Patient    Referring Provider: Jodi Marble, MD  Location: Office  Delivery: Face-to-face     HPI  Reason for encounter: [name], a 64 y.o. year old female, is here today for evaluation and management of her Chronic pain syndrome [G89.4]. [name] primary complain today is No chief complaint on file. Last encounter: Practice (07/29/2020). My last encounter with her was on 07/29/2020. Pertinent problems: [name] has Chronic pain syndrome; Chronic low back pain (Secondary Area of Pain) (Bilateral) (R>L) w/ sciatica (Right); Chronic lower extremity pain (Tertiary Area of Pain) (Bilateral) (R>L); Chronic knee pain (Fourth Area of Pain) (Bilateral) (R>L); Chronic shoulder pain (Fifth Area of Pain) (Bilateral) (R>L); Occipital neuralgia (Primary Area of Pain) (Bilateral) (R>L); Chronic neck pain (Bilateral) (R>L); Chronic sacroiliac joint pain (Bilateral) (R>L); Chronic migraine without aura; Muscle spasm; Osteoarthritis of knee (Bilateral) (R>L); Rotator cuff injury; Spondylosis without myelopathy or radiculopathy, lumbosacral region; Chronic shoulder pain (Right); Chronic headache disorder (Primary Area of Pain); Occipital headache (Right); Frontal headache (Left); Chronic low back pain (Midline)  (Secondary Area of Pain); Chronic lumbar radiculopathy (Right); Osteoarthritis involving multiple joints; Osteoarthritis of shoulder (Bilateral); Chronic musculoskeletal pain; Lumbar facet syndrome (Bilateral) (R>L); Chronic hip pain (Tertiary Area of Pain) (Bilateral) (R>L); DDD (degenerative disc disease), cervical; Cervical facet arthropathy (Bilateral); Cervical facet syndrome (Bilateral) (R>L); Cervical Grade 1 Anterolisthesis of C4 over C5 (Degenerative); Cervical Grade 1 Retrolisthesis of C5 over C6; Cervical foraminal stenosis (C3-4, C4-5, and C5-6) (Bilateral); DDD (degenerative disc disease), lumbosacral; Osteoarthritis of lumbar spine; Osteoarthritis of facet joint of lumbar spine; Spondylosis without myelopathy or radiculopathy, cervicothoracic region; Lumbar spondylosis; Cervicogenic headache; Chronic low back pain (Secondary Area of Pain) (Bilateral) (R>L) w/o sciatica; Abnormal MRI, lumbar spine (07/02/2019); Lumbar foraminal stenosis (Bilateral: L2-S1); Osteoarthritis of hip (Right); Greater trochanteric bursitis (Right); Lumbosacral spinal stenosis; Lumbosacral (intervertebral disc displacement) (IVDD); Lumbar nerve root impingement (Left: L4) (Right: L5); Grade 1 Anterolisthesis of L5/S1; Lumbar facet arthropathy (L3-4, L4-5, L5-S1); Lumbosacral facet joint synovial cyst (Right: L5-S1); Neurogenic pain; Lumbosacral spondylosis with radiculopathy (Right); and Other spondylosis, sacral and sacrococcygeal region on their pertinent problem list. Pain Assessment: Severity of   is reported as a  /10. Location:    / . Onset:  . Quality:  . Timing:  . Modifying factor(s):  Marland Kitchen Vitals:  vitals were not taken for this visit.   *** 06/17/2020 UDS positive for THC.  Number opioids.  The patient is currently being tapered off the oxycodone.  She should complete the taper by 09/02/2020.  Transfer: Tizanidine (Zanaflex) 4 mg tablet, 1 tablet p.o. 3 times daily (90/month) (09/25/2020); lidocaine (Xylocaine) 5%  ointment, 1 application 4 times daily (35.44 g/month) (09/25/2020); diclofenac sodium (Voltaren) 1% gel, 4 g 4 times daily (350 g/month) (09/25/2020)  Pharmacotherapy Assessment   Analgesic: Abnormal (06/17/2020) UDS (+) THC. D/C - Oxycodone IR 10 mg 1 tab PO q8 hrs (30 mg/day of  oxycodone) MME/day:45 MME/day.   Monitoring: [place] PMP: PDMP reviewed during this encounter.       Pharmacotherapy: No side-effects or adverse reactions reported. Compliance: No problems identified. Effectiveness: Clinically acceptable.  No notes on file  UDS:  Summary  Date Value Ref Range Status  06/17/2020 Note  Final    Comment:    ==================================================================== [employer] [facility] 13 (MW) ==================================================================== Test                             Result       Flag       Units  Drug Present and Declared for Prescription Verification   Oxycodone                      254          EXPECTED   ng/mg creat   Oxymorphone                    2883         EXPECTED   ng/mg creat   Noroxycodone                   1986         EXPECTED   ng/mg creat   Noroxymorphone                 1073         EXPECTED   ng/mg creat    Sources of oxycodone are scheduled prescription medications.    Oxymorphone, noroxycodone, and noroxymorphone are expected    metabolites of oxycodone. Oxymorphone is also available as a    scheduled prescription medication.  Drug Present not Declared for Prescription Verification   Carboxy-THC                    3            UNEXPECTED ng/mg creat    Carboxy-THC is a metabolite of tetrahydrocannabinol (THC). Source of    THC is most commonly herbal marijuana or marijuana-based products,    but THC is also present in a scheduled prescription medication.    Trace amounts of THC can be present in hemp and cannabidiol (CBD)    products. This test is not intended to distinguish between delta-9-    tetrahydrocannabinol, the  predominant form of THC in most herbal or    marijuana-based products, and delta-8-tetrahydrocannabinol.  Drug Absent but Declared for Prescription Verification   Alprazolam                     Not Detected UNEXPECTED ng/mg creat ==================================================================== Test                      Result    Flag   Units      Ref Range   Creatinine              120              mg/dL      >=20 ==================================================================== Declared Medications:  The flagging and interpretation on this report are based on the  following declared medications.  Unexpected results may arise from  inaccuracies in the declared medications.   **Note: The testing scope of this panel includes these medications:   Alprazolam (Xanax)  Oxycodone   **Note: The testing scope of this panel does not  include the  following reported medications:   Albuterol  Amlodipine (Norvasc)  Aspirin  Atorvastatin (Lipitor)  Betamethasone  Betamethasone (Lotrisone)  Budesonide (Symbicort)  Buspirone (Buspar)  Calcium  Citalopram (Celexa)  Clotrimazole (Lotrisone)  Dexlansoprazole (Dexilant)  Diclofenac (Voltaren)  Diphenhydramine (Benadryl)  Doxycycline  Formoterol (Symbicort)  Hydrochlorothiazide (Hydrodiuril)  Hydroxyzine (Atarax)  Ipratropium (Atrovent)  Levetiracetam (Keppra)  Lidocaine (Xylocaine)  Linaclotide (Linzess)  Nitroglycerin (Nitrostat)  Pantoprazole (Protonix)  Potassium (Klor-Con)  Promethazine (Phenergan)  Quetiapine (Seroquel)  Sucralfate (Carafate)  Tizanidine (Zanaflex)  Torsemide (Demadex)  Trazodone (Desyrel)  Venlafaxine (Effexor) ==================================================================== For clinical consultation, please call [phone]. ====================================================================      ROS  Constitutional: Denies any fever or chills Gastrointestinal: No reported hemesis,  hematochezia, vomiting, or acute GI distress Musculoskeletal: Denies any acute onset joint swelling, redness, loss of ROM, or weakness Neurological: No reported episodes of acute onset apraxia, aphasia, dysarthria, agnosia, amnesia, paralysis, loss of coordination, or loss of consciousness  Medication Review  ALPRAZolam, ARIPiprazole, Albuterol Sulfate, Medical Compression Stockings, QUEtiapine, albuterol, amLODipine, aspirin EC, atorvastatin, betamethasone (augmented), budesonide-formoterol, busPIRone, calcium carbonate, citalopram, clotrimazole-betamethasone, dexlansoprazole, diclofenac Sodium, diphenhydrAMINE, hydrOXYzine, hydrochlorothiazide, ipratropium, lactulose, levETIRAcetam, lidocaine, linaclotide, losartan, lubiprostone, mirtazapine, nitroGLYCERIN, oxyCODONE, pantoprazole, potassium chloride SA, promethazine, sucralfate, tiZANidine, torsemide, traZODone, and venlafaxine  History Review  Allergy: [name] is allergic to contrast media [iodinated diagnostic agents], gabapentin, labetalol, sulfabenzamide, penicillins, and pregabalin. Drug: [name]  reports no history of drug use. Alcohol:  reports current alcohol use. Tobacco:  reports that she has never smoked. She has never used smokeless tobacco. Social: Ms. Rockman  reports that she has never smoked. She has never used smokeless tobacco. She reports current alcohol use. She reports that she does not use drugs. Medical:  has a past medical history of Acute postoperative pain (01/16/2019), Allergy, Asthma, Cerebral aneurysm, Chest pain (11/01/2016), Chronic migraine, Chronic respiratory failure ([place]), COPD (chronic obstructive pulmonary disease) ([place]), Depression, Hypertension, Osteoarthritis, Pneumonia, Sarcoid, and Seizures ([place]). Surgical: [name]  has a past surgical history that includes Cholecystectomy; Abdominal hysterectomy; Spinal fusion; Back surgery; Esophagogastroduodenoscopy (egd) with propofol (N/A, 08/19/2019); and Colonoscopy  with propofol (N/A, 08/19/2019). Family: family history includes Breast cancer in her mother; Dementia in her mother; Throat cancer in her father.  Laboratory Chemistry Profile   Renal Lab Results  Component Value Date   BUN 24 (H) 11/22/2019   CREATININE 1.46 (H) 11/22/2019   GFRAA 44 (L) 11/22/2019   GFRNONAA 38 (L) 11/22/2019     Hepatic Lab Results  Component Value Date   AST 20 10/08/2019   ALT 21 10/08/2019   ALBUMIN 4.4 10/08/2019   ALKPHOS 57 10/08/2019   LIPASE 19 10/08/2019     Electrolytes Lab Results  Component Value Date   NA 137 11/22/2019   K 3.7 11/22/2019   CL 102 11/22/2019   CALCIUM 9.6 11/22/2019   MG 1.9 12/20/2018     Bone Lab Results  Component Value Date   25OHVITD1 14 (L) 02/07/2018   25OHVITD2 <1.0 02/07/2018   25OHVITD3 14 02/07/2018     Inflammation (CRP: Acute Phase) (ESR: Chronic Phase) Lab Results  Component Value Date   CRP <0.8 02/07/2018   ESRSEDRATE 53 (H) 02/07/2018   LATICACIDVEN 1.6 12/18/2018       Note: Above Lab results reviewed.  Recent Imaging Review  DG PAIN CLINIC C-ARM 1-60 MIN NO REPORT Fluoro was used, but no Radiologist interpretation will be provided.  Please refer to "NOTES" tab for provider progress note. Note: Reviewed  Physical Exam  General appearance: Well nourished, well developed, and well hydrated. In no apparent acute distress Mental status: Alert, oriented x 3 (person, place, & time)       Respiratory: No evidence of acute respiratory distress Eyes: PERLA Vitals: There were no vitals taken for this visit. BMI: Estimated body mass index is 28.52 kg/m as calculated from the following:   Height as of 07/22/20: _0  (1.6 m).   Weight as of 07/22/20: 161 lb (73 kg). Ideal: Ideal body weight: 52.4 kg (115 lb 8.3 oz) Adjusted ideal body weight: 60.7 kg (133 lb 11.4 oz)  Assessment   Status Diagnosis  Controlled Controlled Controlled 1. Chronic pain syndrome   2. Chronic headache disorder  (Primary Area of Pain)   3. Chronic low back pain (Secondary Area of Pain) (Bilateral) (R>L) w/o sciatica   4. Chronic hip pain (Tertiary Area of Pain) (Bilateral) (R>L)   5. Chronic knee pain (Fourth Area of Pain) (Bilateral) (R>L)   6. Chronic shoulder pain (Fifth Area of Pain) (Bilateral) (R>L)      Updated Problems: Problem  Chronic Pain Syndrome   Pain clinic is seeing  Last Assessment & Plan:  See pain clinic notes. She expressed frustration that I have not prescribed her pain meds  Formatting of this note might be different from the original. Pain clinic is seeing  Last Assessment & Plan:  Formatting of this note might be different from the original. See pain clinic notes. She expressed frustration that I have not prescribed her pain meds     Plan of Care  Problem-specific:  No problem-specific Assessment & Plan notes found for this encounter.  Jean Gomez has a current medication list which includes the following long-term medication(s): albuterol, albuterol sulfate, amlodipine, aripiprazole, atorvastatin, calcium carbonate, citalopram, dexilant, diclofenac sodium, diphenhydramine, hydrochlorothiazide, ipratropium, levetiracetam, levetiracetam, lidocaine, linaclotide, losartan, nitroglycerin, oxycodone, [START ON 08/05/2020] oxycodone, [START ON 08/12/2020] oxycodone, [START ON 08/19/2020] oxycodone, [START ON 08/26/2020] oxycodone, pantoprazole, potassium chloride sa, promethazine, sucralfate, tizanidine, and torsemide.  Pharmacotherapy (Medications Ordered): No orders of the defined types were placed in this encounter.  Orders:  No orders of the defined types were placed in this encounter.  Follow-up plan:   No follow-ups on file.      Interventional treatment options:  Under consideration: NOTE: CONTRAST & FENTANYL Allergy. Diagnostic bilateral L2 and L3 TFESI #1  Diagnostic left L4 TFESI #1 Diagnostic/therapeutic right L5-S1 IA synovial cyst facet joint  injection #1  Diagnostic bilateral L5 TFESI #1  Possible bilateral occipital nerve RFA Possible bilateral occipital nerve peripheral nerves stimulator trial Diagnostic bilateral cervical facet block Possible bilateral cervical facet RFA Diagnostic right CESI Diagnostic bilateral suprascapular NB Possible bilateral suprascapular nerve RFA Diagnostic right LESI Diagnostic left IA hip joint injection Diagnostic bilateral femoral + obturator NB Possible bilateral femoral + obturator nerve RFA Diagnostic bilateral genicular NB Possible bilateral genicular RFA   Therapeutic/palliative (PRN): Diagnostic midline caudal ESI #2 (100/90/40/<50)  Diagnostic right IA hip joint injection #2  Diagnostic right trochanteric bursa injection #2  Diagnostic bilateral IA shoulder joint injection #2 (100/100/50/75)  Diagnostic right L5 TFESI #2 (100/100/97/>50)  Diagnostic left L4-5 LESI #2 (100/100/97/>50)  Palliative bilateral lumbar facet blocks #5 (100/100/90/50)  Palliative right lumbar facet RFA #2 (last done 01/16/2019) Palliative left lumbar facet RFA #2 (last done 01/30/2019) Diagnostic bilateral GONB#2(100/0/25/50)  Palliative bilateral IA knee joint injection (w/ steroid)  Palliative bilateral IA Hyalgan knee injections(series#2) (series#1completed on 07/04/2018)  Palliative bilateral SI joint  block #2     Recent Visits Date Type Provider Dept  07/22/20 Procedure visit Milinda Pointer, MD [facility]  07/20/20 Telemedicine Milinda Pointer, MD [facility]  07/01/20 Procedure visit Milinda Pointer, MD [facility]  06/17/20 Office Visit Milinda Pointer, MD [facility]  Showing recent visits within past 90 days and meeting all other requirements Today's Visits Date Type Provider Dept  08/02/20 Appointment Milinda Pointer, MD [facility]  Showing today's visits and meeting all other requirements Future  Appointments No visits were found meeting these conditions. Showing future appointments within next 90 days and meeting all other requirements  I discussed the assessment and treatment plan with the patient. The patient was provided an opportunity to ask questions and all were answered. The patient agreed with the plan and demonstrated an understanding of the instructions.  Patient advised to call back or seek an in-person evaluation if the symptoms or condition worsens.  Duration of encounter: *** minutes.  Note by: Gaspar Cola, MD Date: 08/02/2020; Time: 6:32 AM

## 2020-08-03 ENCOUNTER — Telehealth: Payer: Self-pay

## 2020-08-03 ENCOUNTER — Encounter: Payer: Self-pay | Admitting: General Surgery

## 2020-08-03 NOTE — Telephone Encounter (Signed)
The medication is not helping her pain at all. She wants someone to call her

## 2020-08-03 NOTE — Telephone Encounter (Signed)
Please schedule an appointment. Dr.Naveira will not discuss medication changes outside of an appt.

## 2020-08-04 ENCOUNTER — Encounter: Payer: Self-pay | Admitting: General Surgery

## 2020-08-04 ENCOUNTER — Ambulatory Visit: Payer: Medicare Other | Admitting: Anesthesiology

## 2020-08-04 ENCOUNTER — Other Ambulatory Visit: Payer: Self-pay | Admitting: General Surgery

## 2020-08-04 ENCOUNTER — Ambulatory Visit
Admission: RE | Admit: 2020-08-04 | Discharge: 2020-08-04 | Disposition: A | Discharge status: Home / Self Care | Payer: Medicare Other | Attending: General Surgery | Admitting: General Surgery

## 2020-08-04 ENCOUNTER — Encounter
Admission: RE | Disposition: A | Discharge status: Home / Self Care | Payer: Self-pay | Source: Home / Self Care | Attending: General Surgery

## 2020-08-04 DIAGNOSIS — K269 Duodenal ulcer, unspecified as acute or chronic, without hemorrhage or perforation: Secondary | ICD-10-CM | POA: Insufficient documentation

## 2020-08-04 DIAGNOSIS — D869 Sarcoidosis, unspecified: Secondary | ICD-10-CM | POA: Insufficient documentation

## 2020-08-04 DIAGNOSIS — Z7951 Long term (current) use of inhaled steroids: Secondary | ICD-10-CM | POA: Diagnosis not present

## 2020-08-04 DIAGNOSIS — G43909 Migraine, unspecified, not intractable, without status migrainosus: Secondary | ICD-10-CM | POA: Diagnosis not present

## 2020-08-04 DIAGNOSIS — Z882 Allergy status to sulfonamides status: Secondary | ICD-10-CM | POA: Diagnosis not present

## 2020-08-04 DIAGNOSIS — R569 Unspecified convulsions: Secondary | ICD-10-CM | POA: Insufficient documentation

## 2020-08-04 DIAGNOSIS — R1310 Dysphagia, unspecified: Secondary | ICD-10-CM | POA: Diagnosis not present

## 2020-08-04 DIAGNOSIS — F419 Anxiety disorder, unspecified: Secondary | ICD-10-CM | POA: Insufficient documentation

## 2020-08-04 DIAGNOSIS — Z79899 Other long term (current) drug therapy: Secondary | ICD-10-CM | POA: Insufficient documentation

## 2020-08-04 DIAGNOSIS — F319 Bipolar disorder, unspecified: Secondary | ICD-10-CM | POA: Diagnosis not present

## 2020-08-04 DIAGNOSIS — K219 Gastro-esophageal reflux disease without esophagitis: Secondary | ICD-10-CM | POA: Insufficient documentation

## 2020-08-04 DIAGNOSIS — Z888 Allergy status to other drugs, medicaments and biological substances status: Secondary | ICD-10-CM | POA: Insufficient documentation

## 2020-08-04 DIAGNOSIS — Z7982 Long term (current) use of aspirin: Secondary | ICD-10-CM | POA: Insufficient documentation

## 2020-08-04 DIAGNOSIS — G4730 Sleep apnea, unspecified: Secondary | ICD-10-CM | POA: Diagnosis not present

## 2020-08-04 DIAGNOSIS — K259 Gastric ulcer, unspecified as acute or chronic, without hemorrhage or perforation: Secondary | ICD-10-CM | POA: Insufficient documentation

## 2020-08-04 DIAGNOSIS — J449 Chronic obstructive pulmonary disease, unspecified: Secondary | ICD-10-CM | POA: Diagnosis not present

## 2020-08-04 DIAGNOSIS — Z880 Allergy status to penicillin: Secondary | ICD-10-CM | POA: Diagnosis not present

## 2020-08-04 DIAGNOSIS — M1990 Unspecified osteoarthritis, unspecified site: Secondary | ICD-10-CM | POA: Diagnosis not present

## 2020-08-04 DIAGNOSIS — K449 Diaphragmatic hernia without obstruction or gangrene: Secondary | ICD-10-CM | POA: Diagnosis not present

## 2020-08-04 DIAGNOSIS — Z9981 Dependence on supplemental oxygen: Secondary | ICD-10-CM | POA: Diagnosis not present

## 2020-08-04 DIAGNOSIS — D509 Iron deficiency anemia, unspecified: Secondary | ICD-10-CM | POA: Diagnosis not present

## 2020-08-04 DIAGNOSIS — I10 Essential (primary) hypertension: Secondary | ICD-10-CM | POA: Insufficient documentation

## 2020-08-04 DIAGNOSIS — J9610 Chronic respiratory failure, unspecified whether with hypoxia or hypercapnia: Secondary | ICD-10-CM | POA: Diagnosis not present

## 2020-08-04 HISTORY — PX: ESOPHAGOGASTRODUODENOSCOPY (EGD) WITH PROPOFOL: SHX5813

## 2020-08-04 HISTORY — DX: Sleep apnea, unspecified: G47.30

## 2020-08-04 SURGERY — ESOPHAGOGASTRODUODENOSCOPY (EGD) WITH PROPOFOL
Anesthesia: General

## 2020-08-04 MED ORDER — SODIUM CHLORIDE 0.9 % IV SOLN: INTRAVENOUS | Status: DC

## 2020-08-04 MED ORDER — PHENYLEPHRINE HCL (PRESSORS) 10 MG/ML IV SOLN
INTRAVENOUS | Status: DC | PRN
  Administered 2020-08-04: 100 ug via INTRAVENOUS
  Administered 2020-08-04: 200 ug via INTRAVENOUS

## 2020-08-04 MED ORDER — PROPOFOL 10 MG/ML IV BOLUS
INTRAVENOUS | Status: DC | PRN
  Administered 2020-08-04 (×4): 30 mg via INTRAVENOUS
  Administered 2020-08-04: 50 mg via INTRAVENOUS
  Administered 2020-08-04 (×2): 30 mg via INTRAVENOUS

## 2020-08-04 MED ORDER — LIDOCAINE HCL (CARDIAC) PF 100 MG/5ML IV SOSY
PREFILLED_SYRINGE | INTRAVENOUS | Status: DC | PRN
  Administered 2020-08-04: 80 mg via INTRAVENOUS

## 2020-08-04 MED ORDER — LIDOCAINE HCL (PF) 1 % IJ SOLN
INTRAMUSCULAR | Status: AC
  Filled 2020-08-04: qty 2

## 2020-08-04 NOTE — Op Note (Addendum)
[facility] Gastroenterology Patient Name: Jean Gomez Procedure Date: 08/04/2020 10:28 AM MRN: [id number] Account #: [id number] Date of Birth: 06-05-1956 Admit Type: Outpatient Age: 64 Room: [facility] ENDO ROOM 1 Gender: Female Note Status: Supervisor Override Procedure:             Upper GI endoscopy Indications:           Dysphagia Providers:             Earline Mayotte, MD Medicines:             Monitored Anesthesia Care Complications:         No immediate complications. Procedure:             Pre-Anesthesia Assessment:                        - Prior to the procedure, a History and Physical was                         performed, and patient medications, allergies and                         sensitivities were reviewed. The patient's tolerance                         of previous anesthesia was reviewed.                        - The risks and benefits of the procedure and the                         sedation options and risks were discussed with the                         patient. All questions were answered and informed                         consent was obtained.                        After obtaining informed consent, the endoscope was                         passed under direct vision. Throughout the procedure,                         the patient's blood pressure, pulse, and oxygen                         saturations were monitored continuously. The Endoscope                         was introduced through the mouth, and advanced to the                         second part of duodenum. The upper GI endoscopy was                         accomplished without difficulty. The patient tolerated  the procedure well. Findings:      A small hiatal hernia was present.      One non-bleeding cratered gastric ulcer with pigmented material was       found on the posterior wall of the gastric antrum. The lesion was 18 mm       in largest  dimension. Biopsies were taken with a cold forceps for       histology.      A few diffuse erosions without bleeding were found in the duodenal bulb. Impression:            - Small hiatal hernia.                        - Non-bleeding gastric ulcer with pigmented material.                         Biopsied.                        - Duodenal erosions without bleeding. Recommendation:        - Telephone endoscopist for pathology results in 1                         week. Procedure Code(s):     --- Professional ---                        [phone], Esophagogastroduodenoscopy, flexible,                         transoral; with biopsy, single or multiple Diagnosis Code(s):     --- Professional ---                        K44.9, Diaphragmatic hernia without obstruction or                         gangrene                        K25.9, Gastric ulcer, unspecified as acute or chronic,                         without hemorrhage or perforation                        K26.9, Duodenal ulcer, unspecified as acute or                         chronic, without hemorrhage or perforation                        D50.9, Iron deficiency anemia, unspecified                        R13.10, Dysphagia, unspecified CPT copyright 2019 American Medical Association. All rights reserved. The codes documented in this report are preliminary and upon coder review may  be revised to meet current compliance requirements. Earline Mayotte, MD 08/04/2020 10:51:40 AM This report has been signed electronically. Number of Addenda: 0 Note Initiated On: 08/04/2020 10:28 AM Estimated Blood Loss:  Estimated blood loss: none.      [facility]

## 2020-08-04 NOTE — Transfer of Care (Signed)
Immediate Anesthesia Transfer of Care Note  Patient: Jean Gomez  Procedure(s) Performed: ESOPHAGOGASTRODUODENOSCOPY (EGD) WITH PROPOFOL (N/A )  Patient Location: PACU and Endoscopy Unit  Anesthesia Type:General  Level of Consciousness: awake, drowsy and patient cooperative  Airway & Oxygen Therapy: Patient Spontanous Breathing and Patient connected to nasal cannula oxygen  Post-op Assessment: Report given to RN and Post -op Vital signs reviewed and stable  Post vital signs: Reviewed and stable  Last Vitals:  Vitals Value Taken Time  BP 115/78 08/04/20 1054  Temp 36.2 C 08/04/20 1050  Pulse 93 08/04/20 1057  Resp 20 08/04/20 1057  SpO2 100 % 08/04/20 1057  Vitals shown include unvalidated device data.  Last Pain:  Vitals:   08/04/20 1050  TempSrc: Temporal  PainSc:          Complications: No complications documented.

## 2020-08-04 NOTE — Anesthesia Preprocedure Evaluation (Signed)
Anesthesia Evaluation  Patient identified by MRN, date of birth, ID band Patient awake    Reviewed: Allergy & Precautions, NPO status , Patient's Chart, lab work & pertinent test results  History of Anesthesia Complications Negative for: history of anesthetic complications  Airway Mallampati: III       Dental  (+) Upper Dentures, Lower Dentures   Pulmonary asthma , sleep apnea, Continuous Positive Airway Pressure Ventilation and Oxygen sleep apnea , pneumonia, COPD (4 L [place]),  COPD inhaler and oxygen dependent, Not current smoker,           Cardiovascular hypertension, Pt. on medications (-) Past MI and (-) CHF + dysrhythmias (occassional palpitations) (-) Valvular Problems/Murmurs     Neuro/Psych Seizures - (none in last 4 years), Well Controlled,  Anxiety Depression Bipolar Disorder    GI/Hepatic Neg liver ROS, hiatal hernia, GERD  Medicated and Controlled,  Endo/Other  neg diabetes  Renal/GU Hematuria, not diagnosed     Musculoskeletal  (+) Arthritis ,   Abdominal   Peds  Hematology   Anesthesia Other Findings   Reproductive/Obstetrics                             Anesthesia Physical  Anesthesia Plan  ASA: III  Anesthesia Plan: General   Post-op Pain Management:    Induction: Intravenous  PONV Risk Score and Plan: 3 and Propofol infusion, TIVA and Midazolam  Airway Management Planned: Nasal Cannula  Additional Equipment:   Intra-op Plan:   Post-operative Plan:   Informed Consent: I have reviewed the patients History and Physical, chart, labs and discussed the procedure including the risks, benefits and alternatives for the proposed anesthesia with the patient or authorized representative who has indicated his/her understanding and acceptance.     Dental Advisory Given  Plan Discussed with: Anesthesiologist, CRNA and Surgeon  Anesthesia Plan Comments: (Patient consented  for risks of anesthesia including but not limited to:  - adverse reactions to medications - risk of intubation if required - damage to eyes, teeth, lips or other oral mucosa - nerve damage due to positioning  - sore throat or hoarseness - Damage to heart, brain, nerves, lungs, other parts of body or loss of life  Patient voiced understanding.)        Anesthesia Quick Evaluation

## 2020-08-04 NOTE — Anesthesia Postprocedure Evaluation (Signed)
Anesthesia Post Note  Patient: Jean Gomez  Procedure(s) Performed: ESOPHAGOGASTRODUODENOSCOPY (EGD) WITH PROPOFOL (N/A )  Patient location during evaluation: Endoscopy Anesthesia Type: General Level of consciousness: awake and alert Pain management: pain level controlled Vital Signs Assessment: post-procedure vital signs reviewed and stable Respiratory status: spontaneous breathing, nonlabored ventilation, respiratory function stable and patient connected to nasal cannula oxygen Cardiovascular status: blood pressure returned to baseline and stable Postop Assessment: no apparent nausea or vomiting Anesthetic complications: no   No complications documented.   Last Vitals:  Vitals:   08/04/20 1110 08/04/20 1120  BP: 122/79 120/79  Pulse: 92 (!) 101  Resp: 16 16  Temp:    SpO2: 100% 100%    Last Pain:  Vitals:   08/04/20 1050  TempSrc: Temporal  PainSc:                  Cleda Mccreedy Gift Rueckert

## 2020-08-04 NOTE — H&P (Signed)
Jean Gomez [id number] [birth year]     HPI:  64 y/o woman with a history of slowly progressive anemia and dysphagia. HX: Sarcoidosis and COPD.  Colonoscopy last year was normal.   For EGD.   Medications Prior to Admission  Medication Sig Dispense Refill Last Dose  . albuterol (PROVENTIL) (2.5 MG/3ML) 0.083% nebulizer solution Take 2.5 mg by nebulization every 6 (six) hours as needed for wheezing or shortness of breath.   08/03/2020 at Unknown time  . Albuterol Sulfate 108 (90 Base) MCG/ACT AEPB Inhale 2 puffs into the lungs every 6 (six) hours as needed.   08/03/2020 at Unknown time  . AMITIZA 24 MCG capsule Take 24 mcg by mouth 2 (two) times daily.   08/03/2020 at Unknown time  . amLODipine (NORVASC) 10 MG tablet Take 1 tablet (10 mg total) by mouth daily. 30 tablet 0 08/04/2020 at Unknown time  . ARIPiprazole (ABILIFY) 5 MG tablet Take 5 mg by mouth at bedtime.   08/03/2020 at Unknown time  . aspirin EC 81 MG tablet Take 1 tablet (81 mg total) by mouth daily. 30 tablet 0 08/03/2020 at Unknown time  . atorvastatin (LIPITOR) 20 MG tablet Take 20 mg by mouth daily.   08/03/2020 at Unknown time  . betamethasone, augmented, (DIPROLENE) 0.05 % lotion Apply topically 2 (two) times daily.   08/04/2020 at Unknown time  . budesonide-formoterol (SYMBICORT) 160-4.5 MCG/ACT inhaler Inhale into the lungs.   08/03/2020 at Unknown time  . busPIRone (BUSPAR) 15 MG tablet Take 15 mg by mouth 2 (two) times daily.   08/03/2020 at Unknown time  . calcium carbonate (OS-CAL) 1250 (500 Ca) MG chewable tablet Chew by mouth daily.   08/03/2020 at Unknown time  . citalopram (CELEXA) 20 MG tablet Take 20 mg by mouth daily.   08/03/2020 at Unknown time  . clotrimazole-betamethasone (LOTRISONE) cream Apply 1 application topically 2 (two) times daily. 30 g 0 08/03/2020 at Unknown time  . dexlansoprazole (DEXILANT) 60 MG capsule Take 60 mg by mouth daily.   08/03/2020 at Unknown time  . diclofenac Sodium (VOLTAREN) 1 % GEL Apply  4 g topically 4 (four) times daily as needed. 350 g PRN 08/03/2020 at Unknown time  . diphenhydrAMINE (BENADRYL) 25 MG tablet Take 25 mg by mouth every 6 (six) hours as needed.   Past Month at Unknown time  . Elastic Bandages & Supports (MEDICAL COMPRESSION STOCKINGS) MISC Please provide compression stockings 1 each 0 Past Month at Unknown time  . hydrochlorothiazide (HYDRODIURIL) 25 MG tablet Take 1 tablet (25 mg total) by mouth daily. 30 tablet 0 08/03/2020 at Unknown time  . lactulose (CHRONULAC) 10 GM/15ML solution Take 20 g by mouth 2 (two) times daily.   08/03/2020 at Unknown time  . linaclotide (LINZESS) 145 MCG CAPS capsule Take 145 mcg by mouth daily before breakfast.   08/03/2020 at Unknown time  . nitroGLYCERIN (NITROSTAT) 0.4 MG SL tablet Place 0.4 mg under the tongue every 5 (five) minutes as needed for chest pain.   Past Month at Unknown time  . pantoprazole (PROTONIX) 40 MG tablet Take 1 tablet (40 mg total) by mouth daily. 30 tablet 0 08/03/2020 at Unknown time  . potassium chloride SA (K-DUR,KLOR-CON) 20 MEQ tablet Take 1 tablet (20 mEq total) by mouth 2 (two) times daily. 30 tablet 0 08/03/2020 at Unknown time  . promethazine (PHENERGAN) 25 MG tablet Take 25 mg by mouth every 6 (six) hours as needed for nausea or vomiting.   08/03/2020  at Unknown time  . QUEtiapine (SEROQUEL) 400 MG tablet Take 400 mg by mouth at bedtime.   08/03/2020 at Unknown time  . tiZANidine (ZANAFLEX) 4 MG tablet Take 1 tablet (4 mg total) by mouth 3 (three) times daily. 90 tablet 5 08/03/2020 at Unknown time  . torsemide (DEMADEX) 20 MG tablet Take 1 tablet (20 mg total) by mouth daily. 5 tablet 0 08/03/2020 at Unknown time  . venlafaxine (EFFEXOR) 75 MG tablet TK 1 T PO BID  3 08/03/2020 at Unknown time  . ALPRAZolam (XANAX) 0.25 MG tablet  (Patient not taking: Reported on 07/22/2020)   Completed Course at Unknown time  . hydrOXYzine (ATARAX/VISTARIL) 10 MG tablet Take 10 mg by mouth. (Patient not taking: Reported  on 08/04/2020)   Completed Course at Unknown time  . ipratropium (ATROVENT) 0.02 % nebulizer solution Take 0.5 mg by nebulization every 4 (four) hours as needed for wheezing or shortness of breath. (Patient not taking: Reported on 08/04/2020)   Completed Course at Unknown time  . levETIRAcetam (KEPPRA) 500 MG tablet Take 750 mg by mouth 2 (two) times daily.  (Patient not taking: Reported on 08/04/2020)   Completed Course at Unknown time  . levETIRAcetam (KEPPRA) 750 MG tablet Take 750 mg by mouth daily. (Patient not taking: Reported on 07/22/2020)   Completed Course at Unknown time  . lidocaine (XYLOCAINE) 5 % ointment Apply 1 application topically 4 (four) times daily as needed for moderate pain. 1 application = 6 in. of ointment = 5 g (Max: 20 g/day) 35.44 g 4   . losartan (COZAAR) 25 MG tablet Take 1 tablet (25 mg total) by mouth daily. 30 tablet 0   . mirtazapine (REMERON) 15 MG tablet Take 15 mg by mouth at bedtime. (Patient not taking: Reported on 08/04/2020)   Not Taking at Unknown time  . oxyCODONE (OXY IR/ROXICODONE) 5 MG immediate release tablet Take 1 tablet (5 mg total) by mouth 5 (five) times daily for 7 days. Max: 5/day. Must last 7 days. (Patient not taking: Reported on 08/04/2020) 35 tablet 0 Completed Course at Unknown time  . [START ON 08/05/2020] oxyCODONE (OXY IR/ROXICODONE) 5 MG immediate release tablet Take 1 tablet (5 mg total) by mouth 4 (four) times daily for 7 days. Max: 4/day. Must last 7 days. (Patient not taking: Reported on 08/04/2020) 28 tablet 0 Completed Course at Unknown time  . [START ON 08/12/2020] oxyCODONE (OXY IR/ROXICODONE) 5 MG immediate release tablet Take 1 tablet (5 mg total) by mouth 3 (three) times daily for 7 days. Max: 3/day. Must last 7 days. (Patient not taking: Reported on 08/04/2020) 21 tablet 0 Completed Course at Unknown time  . [START ON 08/19/2020] oxyCODONE (OXY IR/ROXICODONE) 5 MG immediate release tablet Take 1 tablet (5 mg total) by mouth 2 (two) times daily  for 7 days. Max: 2/day. Must last 7 days. (Patient not taking: Reported on 08/04/2020) 14 tablet 0 Completed Course at Unknown time  . [START ON 08/26/2020] oxyCODONE (OXY IR/ROXICODONE) 5 MG immediate release tablet Take 1 tablet (5 mg total) by mouth daily for 7 days. Max: 1/day. Must last 7 days. (Patient not taking: Reported on 08/04/2020) 7 tablet 0 Completed Course at Unknown time  . sucralfate (CARAFATE) 1 g tablet Take 1 tablet (1 g total) by mouth 4 (four) times daily. (Patient not taking: Reported on 08/04/2020) 120 tablet 0 Completed Course at Unknown time  . traZODone (DESYREL) 50 MG tablet Take 50 mg by mouth at bedtime. (Patient not taking:  Reported on 08/04/2020)   Completed Course at Unknown time   Allergies  Allergen Reactions  . Contrast Media [Iodinated Diagnostic Agents] Swelling  . Gabapentin   . Labetalol Other (See Comments)    Made hair fall out  . Sulfabenzamide Nausea Only  . Penicillins Rash  . Pregabalin Other (See Comments)    The patient complained of hemifacial numbness with the use of the medication.   Past Medical History:  Diagnosis Date  . Acute postoperative pain 01/16/2019  . Allergy   . Asthma   . Cerebral aneurysm   . Chest pain 11/01/2016  . Chronic migraine    Migraine  . Chronic respiratory failure (HCC)   . COPD (chronic obstructive pulmonary disease) (HCC)   . Depression    major depression in remission  . Hypertension   . Osteoarthritis   . Pneumonia    12/2018  . Sarcoid   . Seizures (HCC)   . Sleep apnea    Past Surgical History:  Procedure Laterality Date  . ABDOMINAL HYSTERECTOMY    . BACK SURGERY     spine fusion anterior lumbar with open posterior  . CHOLECYSTECTOMY    . COLONOSCOPY WITH PROPOFOL N/A 08/19/2019   Procedure: COLONOSCOPY WITH PROPOFOL;  Surgeon: Christena Deem, MD;  Location: [facility] ENDOSCOPY;  Service: Endoscopy;  Laterality: N/A;  . ESOPHAGOGASTRODUODENOSCOPY (EGD) WITH PROPOFOL N/A 08/19/2019   Procedure:  ESOPHAGOGASTRODUODENOSCOPY (EGD) WITH PROPOFOL;  Surgeon: Christena Deem, MD;  Location: [facility] ENDOSCOPY;  Service: Endoscopy;  Laterality: N/A;  . SPINAL FUSION     Social History   Socioeconomic History  . Marital status: Single    Spouse name: Not on file  . Number of children: Not on file  . Years of education: Not on file  . Highest education level: Not on file  Occupational History  . Not on file  Tobacco Use  . Smoking status: Never Smoker  . Smokeless tobacco: Never Used  Vaping Use  . Vaping Use: Never used  Substance and Sexual Activity  . Alcohol use: Not Currently  . Drug use: No  . Sexual activity: Not on file  Other Topics Concern  . Not on file  Social History Narrative  . Not on file   Social Determinants of Health   Financial Resource Strain:   . Difficulty of Paying Living Expenses:   Food Insecurity:   . Worried About Programme researcher, broadcasting/film/video in the Last Year:   . Barista in the Last Year:   Transportation Needs:   . Freight forwarder (Medical):   Marland Kitchen Lack of Transportation (Non-Medical):   Physical Activity:   . Days of Exercise per Week:   . Minutes of Exercise per Session:   Stress:   . Feeling of Stress :   Social Connections:   . Frequency of Communication with Friends and Family:   . Frequency of Social Gatherings with Friends and Family:   . Attends Religious Services:   . Active Member of Clubs or Organizations:   . Attends Banker Meetings:   Marland Kitchen Marital Status:   Intimate Partner Violence:   . Fear of Current or Ex-Partner:   . Emotionally Abused:   Marland Kitchen Physically Abused:   . Sexually Abused:    Social History   Social History Narrative  . Not on file     ROS: Negative.     PE: HEENT: Negative. Lungs: Clear. Cardio: RR.   Assessment/Plan:  Proceed with  planned endoscopy.   M[facility] 08/04/2020

## 2020-08-05 ENCOUNTER — Encounter: Payer: Self-pay | Admitting: General Surgery

## 2020-08-06 LAB — SURGICAL PATHOLOGY

## 2020-08-12 ENCOUNTER — Ambulatory Visit: Payer: Medicare Other | Attending: Internal Medicine

## 2020-08-12 DIAGNOSIS — G4733 Obstructive sleep apnea (adult) (pediatric): Secondary | ICD-10-CM | POA: Diagnosis present

## 2020-08-16 ENCOUNTER — Other Ambulatory Visit: Payer: Self-pay

## 2020-08-22 ENCOUNTER — Other Ambulatory Visit: Payer: Self-pay

## 2020-08-22 ENCOUNTER — Emergency Department
Admission: EM | Admit: 2020-08-22 | Discharge: 2020-08-22 | Disposition: A | Discharge status: Home / Self Care | Payer: Medicare Other | Attending: Student in an Organized Health Care Education/Training Program | Admitting: Student in an Organized Health Care Education/Training Program

## 2020-08-22 ENCOUNTER — Emergency Department: Payer: Medicare Other

## 2020-08-22 DIAGNOSIS — J449 Chronic obstructive pulmonary disease, unspecified: Secondary | ICD-10-CM | POA: Insufficient documentation

## 2020-08-22 DIAGNOSIS — R519 Headache, unspecified: Secondary | ICD-10-CM | POA: Insufficient documentation

## 2020-08-22 DIAGNOSIS — Y999 Unspecified external cause status: Secondary | ICD-10-CM | POA: Diagnosis not present

## 2020-08-22 DIAGNOSIS — Z79899 Other long term (current) drug therapy: Secondary | ICD-10-CM | POA: Insufficient documentation

## 2020-08-22 DIAGNOSIS — Z7982 Long term (current) use of aspirin: Secondary | ICD-10-CM | POA: Diagnosis not present

## 2020-08-22 DIAGNOSIS — M545 Low back pain: Secondary | ICD-10-CM | POA: Insufficient documentation

## 2020-08-22 DIAGNOSIS — I10 Essential (primary) hypertension: Secondary | ICD-10-CM | POA: Insufficient documentation

## 2020-08-22 DIAGNOSIS — Y929 Unspecified place or not applicable: Secondary | ICD-10-CM | POA: Diagnosis not present

## 2020-08-22 DIAGNOSIS — W19XXXA Unspecified fall, initial encounter: Secondary | ICD-10-CM

## 2020-08-22 DIAGNOSIS — W010XXA Fall on same level from slipping, tripping and stumbling without subsequent striking against object, initial encounter: Secondary | ICD-10-CM | POA: Diagnosis not present

## 2020-08-22 DIAGNOSIS — Y939 Activity, unspecified: Secondary | ICD-10-CM | POA: Diagnosis not present

## 2020-08-22 MED ORDER — ONDANSETRON 4 MG PO TBDP
4.0000 mg | ORAL_TABLET | Freq: Once | ORAL | Status: AC
  Administered 2020-08-22: 4 mg via ORAL
  Filled 2020-08-22: qty 1

## 2020-08-22 MED ORDER — OXYCODONE-ACETAMINOPHEN 5-325 MG PO TABS
1.0000 | ORAL_TABLET | Freq: Once | ORAL | Status: AC
  Administered 2020-08-22: 1 via ORAL
  Filled 2020-08-22: qty 1

## 2020-08-22 NOTE — ED Triage Notes (Signed)
Pt states she tripped over her grand daughters toy last night and fell and hit her head, pt has brusing to the left forehead. Pt has a back brace on arrival, states she is having worsening lower back pain since the fall, also having some new neck pain as welll.

## 2020-08-22 NOTE — ED Provider Notes (Signed)
Emergency Department Provider Note  ____________________________________________  Time seen: Approximately 5:59 PM  I have reviewed the triage vital signs and the nursing notes.   HISTORY  Chief Complaint Head Injury   Historian Patient     HPI Jean Gomez is a 64 y.o. female presents to the emergency department with low back pain and headache.  Patient states that she tripped over one of her grandchildren's toys.  She states that she hit the back of her head during the fall.  She did not lose consciousness.  She denies numbness or tingling in the upper and lower extremities.  No chest pain, chest tightness or abdominal pain.  She states that she has had worsening low back pain since fall occurred.  No bowel or bladder incontinence or saddle anesthesia.  No other alleviating measures have been attempted.   Past Medical History:  Diagnosis Date  . Acute postoperative pain 01/16/2019  . Allergy   . Asthma   . Cerebral aneurysm   . Chest pain 11/01/2016  . Chronic migraine    Migraine  . Chronic respiratory failure (HCC)   . COPD (chronic obstructive pulmonary disease) (HCC)   . Depression    major depression in remission  . Hypertension   . Osteoarthritis   . Pneumonia    12/2018  . Sarcoid   . Seizures (HCC)   . Sleep apnea      Immunizations up to date:  Yes.     Past Medical History:  Diagnosis Date  . Acute postoperative pain 01/16/2019  . Allergy   . Asthma   . Cerebral aneurysm   . Chest pain 11/01/2016  . Chronic migraine    Migraine  . Chronic respiratory failure (HCC)   . COPD (chronic obstructive pulmonary disease) (HCC)   . Depression    major depression in remission  . Hypertension   . Osteoarthritis   . Pneumonia    12/2018  . Sarcoid   . Seizures (HCC)   . Sleep apnea     Patient Active Problem List   Diagnosis Date Noted  . Other spondylosis, sacral and sacrococcygeal region 07/01/2020  . History of Allergy to iodine  01/27/2020    Class: History of  . Lumbosacral spondylosis with radiculopathy (Right) 01/05/2020  . Neurogenic pain 10/29/2019  . Osteoarthritis of hip (Right) 10/09/2019  . Greater trochanteric bursitis (Right) 10/09/2019  . Lumbosacral spinal stenosis 10/09/2019  . Lumbosacral (intervertebral disc displacement) (IVDD) 10/09/2019  . Lumbar nerve root impingement (Left: L4) (Right: L5) 10/09/2019  . Grade 1 Anterolisthesis of L5/S1 10/09/2019  . Lumbar facet arthropathy (L3-4, L4-5, L5-S1) 10/09/2019  . Lumbosacral facet joint synovial cyst (Right: L5-S1) 10/09/2019  . Lumbar foraminal stenosis (Bilateral: L2-S1) 07/10/2019  . Abnormal MRI, lumbar spine (07/02/2019) 07/03/2019  . Disturbance of skin sensation 06/16/2019  . Chronic low back pain (Secondary Area of Pain) (Bilateral) (R>L) w/o sciatica 01/16/2019  . Acute on chronic respiratory failure with hypoxia (HCC) 12/18/2018  . Cervicogenic headache 10/21/2018  . Lumbar spondylosis 06/25/2018  . History of allergy to radiographic contrast media 05/30/2018  . Bipolar affective disorder, current episode manic (HCC) 04/17/2018  . Chronic hip pain [facility](Tertiary Area of Pain) (Bilateral) (R>L) 02/26/2018  . DDD (degenerative disc disease), cervical 02/26/2018  . Cervical facet arthropathy (Bilateral) 02/26/2018  . Cervical facet syndrome (Bilateral) (R>L) 02/26/2018  . Cervical Grade 1 Anterolisthesis of C4 over C5 (Degenerative) 02/26/2018  . Cervical Grade 1 Retrolisthesis of C5 over C6 02/26/2018  .  Cervical foraminal stenosis (C3-4, C4-5, and C5-6) (Bilateral) 02/26/2018  . DDD (degenerative disc disease), lumbosacral 02/26/2018  . Osteoarthritis of lumbar spine 02/26/2018  . Osteoarthritis of facet joint of lumbar spine 02/26/2018  . Spondylosis without myelopathy or radiculopathy, cervicothoracic region 02/26/2018  . Occipital headache (Right) 02/25/2018  . Frontal headache (Left) 02/25/2018  . Chronic low back pain (Midline)  (Secondary Area of Pain) 02/25/2018  . Chronic lumbar radiculopathy (Right) 02/25/2018  . Osteoarthritis involving multiple joints 02/25/2018  . Osteoarthritis of shoulder (Bilateral) 02/25/2018  . Chronic musculoskeletal pain 02/25/2018  . Lumbar facet syndrome (Bilateral) (R>L) 02/25/2018  . Vitamin D deficiency 02/14/2018  . Chronic pain syndrome 02/07/2018  . Pharmacologic therapy 02/07/2018  . Disorder of skeletal system 02/07/2018  . Problems influencing health status 02/07/2018  . Long term current use of opiate analgesic 02/07/2018  . Chronic low back pain (Secondary Area of Pain) (Bilateral) (R>L) w/ sciatica (Right) 02/07/2018  . Chronic lower extremity pain [facility] Area of Pain) (Bilateral) (R>L) 02/07/2018  . Chronic knee pain (Fourth Area of Pain) (Bilateral) (R>L) 02/07/2018  . Chronic shoulder pain (Fifth Area of Pain) (Bilateral) (R>L) 02/07/2018  . Occipital neuralgia (Primary Area of Pain) (Bilateral) (R>L) 02/07/2018  . Chronic neck pain (Bilateral) (R>L) 02/07/2018  . Chronic sacroiliac joint pain (Bilateral) (R>L) 02/07/2018  . H/O aneurysm 01/30/2018  . History of depression 01/30/2018  . History of seizure disorder 01/30/2018  . Chronic headache disorder (Primary Area of Pain) 01/30/2018  . Chronic migraine without aura 01/17/2018  . Chronic respiratory failure with hypoxia (HCC) 01/17/2018  . Constipation 01/17/2018  . HTN, goal below 140/90 01/17/2018  . Hypercholesterolemia 01/17/2018  . Major depression in remission (HCC) 01/17/2018  . COPD (chronic obstructive pulmonary disease) (HCC) 01/17/2018  . Sarcoid 01/17/2018  . Seizure disorder (HCC) 01/17/2018  . Cerebral aneurysm 01/17/2018  . Nonruptured cerebral aneurysm 10/22/2017  . Obstructive sleep apnea on CPAP 09/11/2017  . Generalized anxiety disorder 01/26/2017  . Irritable bowel syndrome 01/26/2017  . Pulmonary emphysema (HCC) 01/26/2017  . GERD (gastroesophageal reflux disease) 08/02/2016  .  Hiatal hernia 08/02/2016  . Essential tremor 07/19/2016  . Muscle spasm 10/05/2015  . Spondylosis without myelopathy or radiculopathy, lumbosacral region 10/05/2015  . Rotator cuff injury 09/17/2015  . Chronic shoulder pain (Right) 09/17/2015  . Epilepsy (HCC) 07/14/2015  . Insomnia 07/14/2015  . Osteoarthritis of knee (Bilateral) (R>L) 06/24/2015    Past Surgical History:  Procedure Laterality Date  . ABDOMINAL HYSTERECTOMY    . BACK SURGERY     spine fusion anterior lumbar with open posterior  . CHOLECYSTECTOMY    . COLONOSCOPY WITH PROPOFOL N/A 08/19/2019   Procedure: COLONOSCOPY WITH PROPOFOL;  Surgeon: Christena Deem, MD;  Location: [facility] ENDOSCOPY;  Service: Endoscopy;  Laterality: N/A;  . ESOPHAGOGASTRODUODENOSCOPY (EGD) WITH PROPOFOL N/A 08/19/2019   Procedure: ESOPHAGOGASTRODUODENOSCOPY (EGD) WITH PROPOFOL;  Surgeon: Christena Deem, MD;  Location: [facility] ENDOSCOPY;  Service: Endoscopy;  Laterality: N/A;  . ESOPHAGOGASTRODUODENOSCOPY (EGD) WITH PROPOFOL N/A 08/04/2020   Procedure: ESOPHAGOGASTRODUODENOSCOPY (EGD) WITH PROPOFOL;  Surgeon: Earline Mayotte, MD;  Location: [facility] ENDOSCOPY;  Service: Endoscopy;  Laterality: N/A;  . SPINAL FUSION      Prior to Admission medications   Medication Sig Start Date End Date Taking? Authorizing Provider  albuterol (PROVENTIL) (2.5 MG/3ML) 0.083% nebulizer solution Take 2.5 mg by nebulization every 6 (six) hours as needed for wheezing or shortness of breath.    [provider]  Albuterol Sulfate 108 (90 Base)  MCG/ACT AEPB Inhale 2 puffs into the lungs every 6 (six) hours as needed.    [provider]  ALPRAZolam Prudy Feeler) 0.25 MG tablet  04/16/19   [provider]  AMITIZA 24 MCG capsule Take 24 mcg by mouth 2 (two) times daily. 06/22/20   [provider]  amLODipine (NORVASC) 10 MG tablet Take 1 tablet (10 mg total) by mouth daily. 04/19/18   Clapacs, Jackquline Denmark, MD  ARIPiprazole (ABILIFY) 5 MG tablet Take 5 mg  by mouth at bedtime. 07/07/20   [provider]  aspirin EC 81 MG tablet Take 1 tablet (81 mg total) by mouth daily. 04/19/18   Clapacs, Jackquline Denmark, MD  atorvastatin (LIPITOR) 20 MG tablet Take 20 mg by mouth daily.    [provider]  betamethasone, augmented, (DIPROLENE) 0.05 % lotion Apply topically 2 (two) times daily.    [provider]  budesonide-formoterol (SYMBICORT) 160-4.5 MCG/ACT inhaler Inhale into the lungs.    [provider]  busPIRone (BUSPAR) 15 MG tablet Take 15 mg by mouth 2 (two) times daily. 04/09/20   [provider]  calcium carbonate (OS-CAL) 1250 (500 Ca) MG chewable tablet Chew by mouth daily.    [provider]  citalopram (CELEXA) 20 MG tablet Take 20 mg by mouth daily.    [provider]  clotrimazole-betamethasone (LOTRISONE) cream Apply 1 application topically 2 (two) times daily. 04/19/18   Clapacs, Jackquline Denmark, MD  dexlansoprazole (DEXILANT) 60 MG capsule Take 60 mg by mouth daily.    [provider]  diclofenac Sodium (VOLTAREN) 1 % GEL Apply 4 g topically 4 (four) times daily as needed. 03/29/20 09/25/20  Delano Metz, MD  diphenhydrAMINE (BENADRYL) 25 MG tablet Take 25 mg by mouth every 6 (six) hours as needed.    [provider]  Elastic Bandages & Supports (MEDICAL COMPRESSION STOCKINGS) MISC Please provide compression stockings 12/30/18   Minna Antis, MD  hydrochlorothiazide (HYDRODIURIL) 25 MG tablet Take 1 tablet (25 mg total) by mouth daily. 04/19/18   Clapacs, Jackquline Denmark, MD  hydrOXYzine (ATARAX/VISTARIL) 10 MG tablet Take 10 mg by mouth. Patient not taking: Reported on 08/04/2020    [provider]  ipratropium (ATROVENT) 0.02 % nebulizer solution Take 0.5 mg by nebulization every 4 (four) hours as needed for wheezing or shortness of breath. Patient not taking: Reported on 08/04/2020    [provider]  lactulose (CHRONULAC) 10 GM/15ML solution Take 20 g by mouth  2 (two) times daily. 06/24/20   [provider]  levETIRAcetam (KEPPRA) 500 MG tablet Take 750 mg by mouth 2 (two) times daily.  Patient not taking: Reported on 08/04/2020 11/20/18   [provider]  levETIRAcetam (KEPPRA) 750 MG tablet Take 750 mg by mouth daily. Patient not taking: Reported on 07/22/2020 07/08/20   [provider]  lidocaine (XYLOCAINE) 5 % ointment Apply 1 application topically 4 (four) times daily as needed for moderate pain. 1 application = 6 in. of ointment = 5 g (Max: 20 g/day) 03/29/20 09/25/20  Delano Metz, MD  linaclotide [facility]) 145 MCG CAPS capsule Take 145 mcg by mouth daily before breakfast.    [provider]  losartan (COZAAR) 25 MG tablet Take 1 tablet (25 mg total) by mouth daily. 04/19/18   Clapacs, Jackquline Denmark, MD  mirtazapine (REMERON) 15 MG tablet Take 15 mg by mouth at bedtime. Patient not taking: Reported on 08/04/2020 07/07/20   [provider]  nitroGLYCERIN (NITROSTAT) 0.4 MG SL tablet  Place 0.4 mg under the tongue every 5 (five) minutes as needed for chest pain.    [provider]  oxyCODONE (OXY IR/ROXICODONE) 5 MG immediate release tablet Take 1 tablet (5 mg total) by mouth 5 (five) times daily for 7 days. Max: 5/day. Must last 7 days. Patient not taking: Reported on 08/04/2020 07/29/20 08/05/20  Delano Metz, MD  oxyCODONE (OXY IR/ROXICODONE) 5 MG immediate release tablet Take 1 tablet (5 mg total) by mouth 4 (four) times daily for 7 days. Max: 4/day. Must last 7 days. Patient not taking: Reported on 08/04/2020 08/05/20 08/12/20  Delano Metz, MD  oxyCODONE (OXY IR/ROXICODONE) 5 MG immediate release tablet Take 1 tablet (5 mg total) by mouth 3 (three) times daily for 7 days. Max: 3/day. Must last 7 days. Patient not taking: Reported on 08/04/2020 08/12/20 08/19/20  Delano Metz, MD  oxyCODONE (OXY IR/ROXICODONE) 5 MG immediate release tablet Take 1 tablet (5 mg total) by mouth 2 (two) times daily for  7 days. Max: 2/day. Must last 7 days. Patient not taking: Reported on 08/04/2020 08/19/20 08/26/20  Delano Metz, MD  oxyCODONE (OXY IR/ROXICODONE) 5 MG immediate release tablet Take 1 tablet (5 mg total) by mouth daily for 7 days. Max: 1/day. Must last 7 days. Patient not taking: Reported on 08/04/2020 08/26/20 09/02/20  Delano Metz, MD  pantoprazole (PROTONIX) 40 MG tablet Take 1 tablet (40 mg total) by mouth daily. 04/19/18   Clapacs, Jackquline Denmark, MD  potassium chloride SA (K-DUR,KLOR-CON) 20 MEQ tablet Take 1 tablet (20 mEq total) by mouth 2 (two) times daily. 04/19/18   Clapacs, Jackquline Denmark, MD  promethazine (PHENERGAN) 25 MG tablet Take 25 mg by mouth every 6 (six) hours as needed for nausea or vomiting.    [provider]  QUEtiapine (SEROQUEL) 400 MG tablet Take 400 mg by mouth at bedtime.    [provider]  sucralfate (CARAFATE) 1 g tablet Take 1 tablet (1 g total) by mouth 4 (four) times daily. Patient not taking: Reported on 08/04/2020 04/19/18   Clapacs, Jackquline Denmark, MD  tiZANidine (ZANAFLEX) 4 MG tablet Take 1 tablet (4 mg total) by mouth 3 (three) times daily. 03/29/20 09/25/20  Delano Metz, MD  torsemide (DEMADEX) 20 MG tablet Take 1 tablet (20 mg total) by mouth daily. 12/30/18   Minna Antis, MD  traZODone (DESYREL) 50 MG tablet Take 50 mg by mouth at bedtime. Patient not taking: Reported on 08/04/2020    [provider]  venlafaxine (EFFEXOR) 75 MG tablet TK 1 T PO BID 09/06/18   [provider]    Allergies Contrast media [iodinated diagnostic agents], Gabapentin, Labetalol, Sulfabenzamide, Penicillins, and Pregabalin  Family History  Problem Relation Age of Onset  . Dementia Mother   . Breast cancer Mother   . Throat cancer Father     Social History Social History   Tobacco Use  . Smoking status: Never Smoker  . Smokeless tobacco: Never Used  Vaping Use  . Vaping Use: Never used  Substance Use Topics  . Alcohol use: Not Currently  .  Drug use: No     Review of Systems  Constitutional: No fever/chills Eyes:  No discharge ENT: No upper respiratory complaints. Respiratory: no cough. No SOB/ use of accessory muscles to breath Gastrointestinal:   No nausea, no vomiting.  No diarrhea.  No constipation. Musculoskeletal: Patient has low back pain.  Skin: Negative for rash, abrasions, lacerations, ecchymosis.    ____________________________________________   PHYSICAL EXAM:  VITAL SIGNS:  ED Triage Vitals  Enc Vitals Group     BP 08/22/20 1540 119/73     Pulse Rate 08/22/20 1546 100     Resp 08/22/20 1540 20     Temp 08/22/20 1540 98.6 F (37 C)     Temp Source 08/22/20 1540 Oral     SpO2 08/22/20 1546 100 %     Weight 08/22/20 1540 160 lb (72.6 kg)     Height 08/22/20 1540 5\' 3"  (1.6 m)     Head Circumference --      Peak Flow --      Pain Score 08/22/20 1547 8     Pain Loc --      Pain Edu? --      Excl. in GC? --      Constitutional: Alert and oriented. Well appearing and in no acute distress. Eyes: Conjunctivae are normal. PERRL. EOMI. Head: Atraumatic. ENT:      Nose: No congestion/rhinnorhea.      Mouth/Throat: Mucous membranes are moist.  Neck: No stridor. FROM.  Cardiovascular: Normal rate, regular rhythm. Normal S1 and S2.  Good peripheral circulation. Respiratory: Normal respiratory effort without tachypnea or retractions. Lungs CTAB. Good air entry to the bases with no decreased or absent breath sounds Gastrointestinal: Bowel sounds x 4 quadrants. Soft and nontender to palpation. No guarding or rigidity. No distention. Musculoskeletal: Full range of motion to all extremities. No obvious deformities noted.  Patient has symmetric strength in the lower extremities. Neurologic:  Normal for age. No gross focal neurologic deficits are appreciated.  No changes in sensation. Skin:  Skin is warm, dry and intact. No rash noted. Psychiatric: Mood and affect are normal for age. Speech and behavior are  normal.   ____________________________________________   LABS (all labs ordered are listed, but only abnormal results are displayed)  Labs Reviewed - No data to display ____________________________________________  EKG   ____________________________________________  RADIOLOGY 10/22/20, personally viewed and evaluated these images (plain radiographs) as part of my medical decision making, as well as reviewing the written report by the radiologist.  DG Lumbar Spine 2-3 Views  Result Date: 08/22/2020 CLINICAL DATA:  Lumbar back pain after fall. Tripped over grand daughter's toy last night leading to fall. Progressive pain. EXAM: LUMBAR SPINE - 2-3 VIEW COMPARISON:  Lumbar MRI 07/02/2019 FINDINGS: No acute fracture. There is slight straightening of normal lordosis. Grade 1 anterolisthesis of L5 on S1 is stable from prior MRI. Multilevel disc space narrowing and endplate spurring most prominent at L3-L4. Prominent lower lumbar facet hypertrophy. Vertebral body heights are normal. The sacroiliac joints are congruent. IMPRESSION: 1. No acute fracture or subluxation of the lumbar spine. 2. Multilevel degenerative disc disease and facet hypertrophy. Electronically Signed   By: 07/04/2019 M.D.   On: 08/22/2020 18:22   CT Head Wo Contrast  Result Date: 08/22/2020 CLINICAL DATA:  Fall, head injury, neck trauma EXAM: CT HEAD WITHOUT CONTRAST CT CERVICAL SPINE WITHOUT CONTRAST TECHNIQUE: Multidetector CT imaging of the head and cervical spine was performed following the standard protocol without intravenous contrast. Multiplanar CT image reconstructions of the cervical spine were also generated. COMPARISON:  None. FINDINGS: CT HEAD FINDINGS Brain: Normal anatomic configuration. Mild to moderate periventricular white matter changes are present likely reflecting the sequela of small vessel ischemia. Remote lacunar infarcts are noted within the basal ganglia bilaterally. No abnormal intra or  extra-axial mass lesion or fluid collection. No abnormal mass effect or midline shift. No evidence of acute  intracranial hemorrhage or infarct. Ventricular size is normal. Cerebellum unremarkable. Vascular: Extensive vascular calcifications are noted within the carotid siphons and distal vertebral arteries bilaterally, however, there is no asymmetric vascular hyperdensity noted at the skull base. Skull: Intact Sinuses/Orbits: There is mild layering fluid within the right sphenoid sinus. Remaining paranasal sinuses are clear. Paranasal sinuses are clear. Orbits are unremarkable. Other: Mastoid air cells and middle ear cavities are clear. CT CERVICAL SPINE FINDINGS Alignment: There is 3 mm anterolisthesis of C4 upon C5 and minimal retrolisthesis of C5 upon C6 both likely degenerative in nature. Skull base and vertebrae: Craniocervical junction is unremarkable. Atlantodental interval is normal. No acute fracture of the cervical spine. No lytic or blastic bone lesions are identified. Soft tissues and spinal canal: No prevertebral fluid or swelling. No visible canal hematoma. Disc levels: Review of the sagittal reformats demonstrates preservation of vertebral body height. There is mild intervertebral disc space narrowing and endplate remodeling at C5-6 in keeping with changes of mild degenerative disc disease. Review of the axial images demonstrates moderate multilevel uncovertebral and facet arthrosis resulting in multilevel neural foraminal narrowing, most severe on the left at C3-4, on the right at C4-5, and bilaterally at C5-6 where this is of mild severity. No significant canal stenosis. Upper chest: Unremarkable Other: None signified IMPRESSION: 1. No acute intracranial abnormality. 2. Mild to moderate periventricular white matter changes likely reflecting the sequela of small vessel ischemia. 3. Remote lacunar infarcts within the basal ganglia bilaterally. 4. No acute fracture or subluxation of the cervical spine.  5. Mild degenerative disc disease and facet arthrosis of the cervical spine, with mild multilevel neural foraminal narrowing as outlined above. 6. Mild layering fluid within the right sphenoid sinus. Electronically Signed   By: Helyn Numbers MD   On: 08/22/2020 16:45   CT Cervical Spine Wo Contrast  Result Date: 08/22/2020 CLINICAL DATA:  Fall, head injury, neck trauma EXAM: CT HEAD WITHOUT CONTRAST CT CERVICAL SPINE WITHOUT CONTRAST TECHNIQUE: Multidetector CT imaging of the head and cervical spine was performed following the standard protocol without intravenous contrast. Multiplanar CT image reconstructions of the cervical spine were also generated. COMPARISON:  None. FINDINGS: CT HEAD FINDINGS Brain: Normal anatomic configuration. Mild to moderate periventricular white matter changes are present likely reflecting the sequela of small vessel ischemia. Remote lacunar infarcts are noted within the basal ganglia bilaterally. No abnormal intra or extra-axial mass lesion or fluid collection. No abnormal mass effect or midline shift. No evidence of acute intracranial hemorrhage or infarct. Ventricular size is normal. Cerebellum unremarkable. Vascular: Extensive vascular calcifications are noted within the carotid siphons and distal vertebral arteries bilaterally, however, there is no asymmetric vascular hyperdensity noted at the skull base. Skull: Intact Sinuses/Orbits: There is mild layering fluid within the right sphenoid sinus. Remaining paranasal sinuses are clear. Paranasal sinuses are clear. Orbits are unremarkable. Other: Mastoid air cells and middle ear cavities are clear. CT CERVICAL SPINE FINDINGS Alignment: There is 3 mm anterolisthesis of C4 upon C5 and minimal retrolisthesis of C5 upon C6 both likely degenerative in nature. Skull base and vertebrae: Craniocervical junction is unremarkable. Atlantodental interval is normal. No acute fracture of the cervical spine. No lytic or blastic bone lesions are  identified. Soft tissues and spinal canal: No prevertebral fluid or swelling. No visible canal hematoma. Disc levels: Review of the sagittal reformats demonstrates preservation of vertebral body height. There is mild intervertebral disc space narrowing and endplate remodeling at C5-6 in keeping with changes of mild degenerative disc  disease. Review of the axial images demonstrates moderate multilevel uncovertebral and facet arthrosis resulting in multilevel neural foraminal narrowing, most severe on the left at C3-4, on the right at C4-5, and bilaterally at C5-6 where this is of mild severity. No significant canal stenosis. Upper chest: Unremarkable Other: None signified IMPRESSION: 1. No acute intracranial abnormality. 2. Mild to moderate periventricular white matter changes likely reflecting the sequela of small vessel ischemia. 3. Remote lacunar infarcts within the basal ganglia bilaterally. 4. No acute fracture or subluxation of the cervical spine. 5. Mild degenerative disc disease and facet arthrosis of the cervical spine, with mild multilevel neural foraminal narrowing as outlined above. 6. Mild layering fluid within the right sphenoid sinus. Electronically Signed   By: Helyn Numbers MD   On: 08/22/2020 16:45    ____________________________________________    PROCEDURES  Procedure(s) performed:     Procedures     Medications  oxyCODONE-acetaminophen (PERCOCET/ROXICET) 5-325 MG per tablet 1 tablet (1 tablet Oral Given 08/22/20 1851)  ondansetron (ZOFRAN-ODT) disintegrating tablet 4 mg (4 mg Oral Given 08/22/20 1851)     ____________________________________________   INITIAL IMPRESSION / ASSESSMENT AND PLAN / ED COURSE  Pertinent labs & imaging results that were available during my care of the patient were reviewed by me and considered in my medical decision making (see chart for details).      Assessment and Plan:  Fall Low back pain:  64 year old female presents to the  emergency department with headache and low back pain after patient had a mechanical fall.  Vital signs were reassuring at triage.  On physical exam, patient was alert with no neuro deficits.  CT head and CT cervical spine revealed no evidence of intracranial bleed or C-spine fracture.  X-ray of the lumbar spine revealed no acute bony abnormality.  Patient is currently under a pain management contract.  She was given Norco in the emergency department and no additional medications were prescribed at discharge.  Return precautions were given to return with new or worsening symptoms.   ____________________________________________  FINAL CLINICAL IMPRESSION(S) / ED DIAGNOSES  Final diagnoses:  Fall, initial encounter      NEW MEDICATIONS STARTED DURING THIS VISIT:  ED Discharge Orders    None          This chart was dictated using voice recognition software/Dragon. Despite best efforts to proofread, errors can occur which can change the meaning. Any change was purely unintentional.     Gasper Lloyd 08/22/20 2002    Willy Eddy, MD 08/22/20 2126

## 2020-08-26 ENCOUNTER — Encounter: Payer: Self-pay | Admitting: Pain Medicine

## 2020-08-26 ENCOUNTER — Ambulatory Visit: Payer: Medicare Other | Attending: Pain Medicine | Admitting: Pain Medicine

## 2020-08-26 ENCOUNTER — Other Ambulatory Visit: Payer: Self-pay

## 2020-08-26 VITALS — BP 138/83 | HR 115 | Temp 97.4°F | Resp 16 | Ht 63.0 in | Wt 160.0 lb

## 2020-08-26 DIAGNOSIS — Z91041 Radiographic dye allergy status: Secondary | ICD-10-CM | POA: Diagnosis present

## 2020-08-26 DIAGNOSIS — M47816 Spondylosis without myelopathy or radiculopathy, lumbar region: Secondary | ICD-10-CM | POA: Insufficient documentation

## 2020-08-26 DIAGNOSIS — M4317 Spondylolisthesis, lumbosacral region: Secondary | ICD-10-CM | POA: Diagnosis present

## 2020-08-26 DIAGNOSIS — M25552 Pain in left hip: Secondary | ICD-10-CM | POA: Insufficient documentation

## 2020-08-26 DIAGNOSIS — G8929 Other chronic pain: Secondary | ICD-10-CM | POA: Insufficient documentation

## 2020-08-26 DIAGNOSIS — M5137 Other intervertebral disc degeneration, lumbosacral region: Secondary | ICD-10-CM | POA: Diagnosis present

## 2020-08-26 DIAGNOSIS — M5441 Lumbago with sciatica, right side: Secondary | ICD-10-CM | POA: Diagnosis present

## 2020-08-26 DIAGNOSIS — G894 Chronic pain syndrome: Secondary | ICD-10-CM | POA: Diagnosis present

## 2020-08-26 DIAGNOSIS — M25551 Pain in right hip: Secondary | ICD-10-CM | POA: Diagnosis present

## 2020-08-26 DIAGNOSIS — M48061 Spinal stenosis, lumbar region without neurogenic claudication: Secondary | ICD-10-CM | POA: Diagnosis present

## 2020-08-26 DIAGNOSIS — M4727 Other spondylosis with radiculopathy, lumbosacral region: Secondary | ICD-10-CM | POA: Insufficient documentation

## 2020-08-26 NOTE — Progress Notes (Signed)
Nursing Pain Medication Assessment:  Safety precautions to be maintained throughout the outpatient stay will include: orient to surroundings, keep bed in low position, maintain call bell within reach at all times, provide assistance with transfer out of bed and ambulation.  Medication Inspection Compliance: Jean Gomez did not comply with our request to bring her pills to be counted. She was reminded that bringing the medication bottles, even when empty, is a requirement.  Medication: None brought in. Pill/Patch Count: None available to be counted. Bottle Appearance: No container available. Did not bring bottle(s) to appointment. Filled Date: N/A Last Medication intake:  Ran out of medicine more than 48 hours ago

## 2020-08-26 NOTE — Progress Notes (Signed)
PROVIDER NOTE: Information contained herein reflects review and annotations entered in association with encounter. Interpretation of such information and data should be left to medically-trained personnel. Information provided to patient can be located elsewhere in the medical record under "Patient Instructions". Document created using STT-dictation technology, any transcriptional errors that may result from process are unintentional.    Patient: Jean Gomez  Service Category: E/M  Provider: Gaspar Cola, MD  DOB: 1956/03/12  DOS: 08/26/2020  Specialty: Interventional Pain Management  MRN: [id number]  Setting: Ambulatory outpatient  PCP: Jodi Marble, MD  Type: Established Patient    Referring Provider: Jodi Marble, MD  Location: Office  Delivery: Face-to-face     HPI  Reason for encounter: Jean Gomez, a 64 y.o. year old female, is here today for evaluation and management of her Chronic pain syndrome [G89.4]. Jean Gomez primary complain today is Back Pain (lower), Hip Pain (left), and Knee Pain (bilateral) Last encounter: Practice (08/03/2020). My last encounter with her was on 08/02/2020. Pertinent problems: Jean Gomez has Chronic pain syndrome; Chronic low back pain (Secondary Area of Pain) (Bilateral) (R>L) w/ sciatica (Right); Chronic lower extremity pain (Tertiary Area of Pain) (Bilateral) (R>L); Chronic knee pain (Fourth Area of Pain) (Bilateral) (R>L); Chronic shoulder pain (Fifth Area of Pain) (Bilateral) (R>L); Occipital neuralgia (Primary Area of Pain) (Bilateral) (R>L); Chronic neck pain (Bilateral) (R>L); Chronic sacroiliac joint pain (Bilateral) (R>L); Chronic migraine without aura; Muscle spasm; Osteoarthritis of knee (Bilateral) (R>L); Rotator cuff injury; Spondylosis without myelopathy or radiculopathy, lumbosacral region; Chronic shoulder pain (Right); Chronic headache disorder (Primary Area of Pain); Occipital headache (Right); Frontal headache (Left);  Chronic low back pain (Midline) (Secondary Area of Pain); Chronic lumbar radiculopathy (Right); Osteoarthritis involving multiple joints; Osteoarthritis of shoulder (Bilateral); Chronic musculoskeletal pain; Lumbar facet syndrome (Bilateral) (R>L); Chronic hip pain (Tertiary Area of Pain) (Bilateral) (R>L); DDD (degenerative disc disease), cervical; Cervical facet arthropathy (Bilateral); Cervical facet syndrome (Bilateral) (R>L); Cervical Grade 1 Anterolisthesis of C4 over C5 (Degenerative); Cervical Grade 1 Retrolisthesis of C5 over C6; Cervical foraminal stenosis (C3-4, C4-5, and C5-6) (Bilateral); DDD (degenerative disc disease), lumbosacral; Osteoarthritis of lumbar spine; Osteoarthritis of facet joint of lumbar spine; Spondylosis without myelopathy or radiculopathy, cervicothoracic region; Lumbar spondylosis; Cervicogenic headache; Chronic low back pain (Secondary Area of Pain) (Bilateral) (R>L) w/o sciatica; Abnormal MRI, lumbar spine (07/02/2019); Lumbar foraminal stenosis (Bilateral: L2-S1); Osteoarthritis of hip (Right); Greater trochanteric bursitis (Right); Lumbosacral spinal stenosis; Lumbosacral (intervertebral disc displacement) (IVDD); Lumbar nerve root impingement (Left: L4) (Right: L5); Grade 1 Anterolisthesis of L5/S1; Lumbar facet arthropathy (L3-4, L4-5, L5-S1); Lumbosacral facet joint synovial cyst (Right: L5-S1); Neurogenic pain; Lumbosacral spondylosis with radiculopathy (Right); and Other spondylosis, sacral and sacrococcygeal region on their pertinent problem list. Pain Assessment: Severity of Chronic pain is reported as a 8 /10. Location: Back Lower/left hip. Onset: More than a month ago. Quality: Sharp, Stabbing. Timing: Constant. Modifying factor(s): lying down, alternating sides. Vitals:  height is $RemoveB'5\' 3"'GDLDvXFb$  (1.6 m) and weight is 160 lb (72.6 kg). Her temporal temperature is 97.4 F (36.3 C) (abnormal). Her blood pressure is 138/83 and her pulse is 115 (abnormal). Her respiration is 16  and oxygen saturation is 100%.   Today the patient returns to the clinic for follow-up evaluation after the bilateral L2 and L3 transforaminal ESI.  In reviewing her current symptoms, it is clear to me that she is still having a lot of problems with the facet joints.  The only problem here is that get  any information from this patient can be very tricky and unreliable.  We have been treating her with lumbar facet blocks which provide her with excellent relief of the pain.  However, on the very last 1 done she told the nursing staff that she only had 50% relief of the pain for the first hour and nothing thereafter.  Today I went ahead and confronted her with this and I explained to her that the problem here is that if she is not accurate with the rest of the information that she has provided [place], then we cannot justify doing any further treatments in that area.  I think that part of the problem is that she is trying to manipulate the information provided to [place] for the purpose of trying to demonstrate to [place] that she is still having quite a bit of pain and discomfort.  To a certain degree of this is a level of symptom exenteration.  Once I explained all this to the patient, she indicated that she needed to apologize but this was probably her fault, because when she is asked this questions she tends to be dismissive and not provide always with accurate results.  Today she tried to go back over what had happened during that procedure and she indicated that she had complete relief of the pain for the duration of the numbing medicine and it was not until 2 to 3 days later that the pain started coming back.  Of course, this is completely opposite to what she told the nurse on the follow-up visit.  In order to clarify this today we have decided to go ahead and repeat this diagnostic lumbar facet block for the purpose of clarifying if she indeed is getting some benefit from the diagnostic injection.  If she is, but she may  again be a good candidate for radiofrequency ablation.  The last time that we did a radiofrequency ablation of her lumbar facets was around 2019 and the reason why she has been experiencing a lot more pain recently is because I believe that it is wearing off.  She seems to have obtained a lot more relief than she had initially reported.  Then again, this could have been an attempt to manipulate [place] into providing her with additional pain medication.  While on that subject, today the patient has again indicated that she has run out of pain medication and that she needs something for her pain.  She does not seem to understand or recall the fact that I told her that we would not be doing that anymore since she tested positive for THC on her UDS.  At one point today, she refused to leave until "I gave her something for the pain".  Fortunately, the nursing staff was so kind as to remind her that it is probably better for her if she follows my instructions and goes through a "drug holiday" and then we can revisit the possibility of repeating her UDS.  Post-Procedure Evaluation  Procedure (08/02/2020): Diagnostic bilateral L2 and L3 transforaminal ESI #1 under fluoroscopic guidance and IV sedation Pre-procedure pain level: 8/10 Post-procedure: 0/10 (100% relief)  Sedation: Sedation provided.  Effectiveness during initial hour after procedure(Ultra-Short Term Relief): 100 %.  Local anesthetic used: Long-acting (4-6 hours) Effectiveness: Defined as any analgesic benefit obtained secondary to the administration of local anesthetics. This carries significant diagnostic value as to the etiological location, or anatomical origin, of the pain. Duration of benefit is expected to coincide with the duration of  the local anesthetic used.  Effectiveness during initial 4-6 hours after procedure(Short-Term Relief): 100 %.  Long-term benefit: Defined as any relief past the pharmacologic duration of the local anesthetics.   Effectiveness past the initial 6 hours after procedure(Long-Term Relief): 0 %.  Current benefits: Defined as benefit that persist at this time.   Analgesia:  Back to baseline Function: Back to baseline ROM: Back to baseline  Pharmacotherapy Assessment   Analgesic: Abnormal (06/17/2020) UDS (+) THC. D/C - Oxycodone IR 10 mg 1 tab PO q8 hrs (30 mg/day of oxycodone) MME/day:45 MME/day.   Monitoring: [place] PMP: PDMP reviewed during this encounter.       Pharmacotherapy: No side-effects or adverse reactions reported. Compliance: No problems identified. Effectiveness: Clinically acceptable.  Landis Martins, RN  08/26/2020  1:26 PM  Sign when Signing Visit Nursing Pain Medication Assessment:  Safety precautions to be maintained throughout the outpatient stay will include: orient to surroundings, keep bed in low position, maintain call bell within reach at all times, provide assistance with transfer out of bed and ambulation.  Medication Inspection Compliance: Jean Gomez did not comply with our request to bring her pills to be counted. She was reminded that bringing the medication bottles, even when empty, is a requirement.  Medication: None brought in. Pill/Patch Count: None available to be counted. Bottle Appearance: No container available. Did not bring bottle(s) to appointment. Filled Date: N/A Last Medication intake:  Ran out of medicine more than 48 hours ago    UDS:  Summary  Date Value Ref Range Status  06/17/2020 Note  Final    Comment:    ==================================================================== [employer] (MW) ==================================================================== Test                             Result       Flag       Units  Drug Present and Declared for Prescription Verification   Oxycodone                      254          EXPECTED   ng/mg creat   Oxymorphone                    2883         EXPECTED   ng/mg creat   Noroxycodone                    1986         EXPECTED   ng/mg creat   Noroxymorphone                 1073         EXPECTED   ng/mg creat    Sources of oxycodone are scheduled prescription medications.    Oxymorphone, noroxycodone, and noroxymorphone are expected    metabolites of oxycodone. Oxymorphone is also available as a    scheduled prescription medication.  Drug Present not Declared for Prescription Verification   Carboxy-THC                    3            UNEXPECTED ng/mg creat    Carboxy-THC is a metabolite of tetrahydrocannabinol (THC). Source of    THC is most commonly herbal marijuana or marijuana-based products,    but THC is also present in a scheduled prescription medication.  Trace amounts of THC can be present in hemp and cannabidiol (CBD)    products. This test is not intended to distinguish between delta-9-    tetrahydrocannabinol, the predominant form of THC in most herbal or    marijuana-based products, and delta-8-tetrahydrocannabinol.  Drug Absent but Declared for Prescription Verification   Alprazolam                     Not Detected UNEXPECTED ng/mg creat ==================================================================== Test                      Result    Flag   Units      Ref Range   Creatinine              120              mg/dL      >=20 ==================================================================== Declared Medications:  The flagging and interpretation on this report are based on the  following declared medications.  Unexpected results may arise from  inaccuracies in the declared medications.   **Note: The testing scope of this panel includes these medications:   Alprazolam (Xanax)  Oxycodone   **Note: The testing scope of this panel does not include the  following reported medications:   Albuterol  Amlodipine (Norvasc)  Aspirin  Atorvastatin (Lipitor)  Betamethasone  Betamethasone (Lotrisone)  Budesonide (Symbicort)  Buspirone (Buspar)  Calcium   Citalopram (Celexa)  Clotrimazole (Lotrisone)  Dexlansoprazole (Dexilant)  Diclofenac (Voltaren)  Diphenhydramine (Benadryl)  Doxycycline  Formoterol (Symbicort)  Hydrochlorothiazide (Hydrodiuril)  Hydroxyzine (Atarax)  Ipratropium (Atrovent)  Levetiracetam (Keppra)  Lidocaine (Xylocaine)  Linaclotide (Linzess)  Nitroglycerin (Nitrostat)  Pantoprazole (Protonix)  Potassium (Klor-Con)  Promethazine (Phenergan)  Quetiapine (Seroquel)  Sucralfate (Carafate)  Tizanidine (Zanaflex)  Torsemide (Demadex)  Trazodone (Desyrel)  Venlafaxine (Effexor) ==================================================================== For clinical consultation, please call [phone]. ====================================================================      ROS  Constitutional: Denies any fever or chills Gastrointestinal: No reported hemesis, hematochezia, vomiting, or acute GI distress Musculoskeletal: Denies any acute onset joint swelling, redness, loss of ROM, or weakness Neurological: No reported episodes of acute onset apraxia, aphasia, dysarthria, agnosia, amnesia, paralysis, loss of coordination, or loss of consciousness  Medication Review  ALPRAZolam, ARIPiprazole, Albuterol Sulfate, Medical Compression Stockings, QUEtiapine, albuterol, amLODipine, aspirin EC, atorvastatin, betamethasone (augmented), budesonide-formoterol, busPIRone, calcium carbonate, citalopram, clotrimazole-betamethasone, dexlansoprazole, diclofenac Sodium, diphenhydrAMINE, hydrOXYzine, hydrochlorothiazide, ipratropium, lactulose, levETIRAcetam, lidocaine, linaclotide, losartan, lubiprostone, mirtazapine, nitroGLYCERIN, oxyCODONE, pantoprazole, potassium chloride SA, promethazine, sucralfate, tiZANidine, torsemide, traZODone, and venlafaxine  History Review  Allergy: Jean Gomez is allergic to contrast media [iodinated diagnostic agents], gabapentin, labetalol, sulfabenzamide, penicillins, and pregabalin. Drug: Jean Gomez   reports no history of drug use. Alcohol:  reports previous alcohol use. Tobacco:  reports that she has never smoked. She has never used smokeless tobacco. Social: Jean Gomez  reports that she has never smoked. She has never used smokeless tobacco. She reports previous alcohol use. She reports that she does not use drugs. Medical:  has a past medical history of Acute postoperative pain (01/16/2019), Allergy, Asthma, Cerebral aneurysm, Chest pain (11/01/2016), Chronic migraine, Chronic respiratory failure ([place]), COPD (chronic obstructive pulmonary disease) ([place]), Depression, Hypertension, Osteoarthritis, Pneumonia, Sarcoid, Seizures ([place]), and Sleep apnea. Surgical: Jean Gomez  has a past surgical history that includes Cholecystectomy; Abdominal hysterectomy; Spinal fusion; Back surgery; Esophagogastroduodenoscopy (egd) with propofol (N/A, 08/19/2019); Colonoscopy with propofol (N/A, 08/19/2019); and Esophagogastroduodenoscopy (egd) with propofol (N/A, 08/04/2020). Family: family history includes Breast cancer in her mother; Dementia  in her mother; Throat cancer in her father.  Laboratory Chemistry Profile   Renal Lab Results  Component Value Date   BUN 24 (H) 11/22/2019   CREATININE 1.46 (H) 11/22/2019   GFRAA 44 (L) 11/22/2019   GFRNONAA 38 (L) 11/22/2019     Hepatic Lab Results  Component Value Date   AST 20 10/08/2019   ALT 21 10/08/2019   ALBUMIN 4.4 10/08/2019   ALKPHOS 57 10/08/2019   LIPASE 19 10/08/2019     Electrolytes Lab Results  Component Value Date   NA 137 11/22/2019   K 3.7 11/22/2019   CL 102 11/22/2019   CALCIUM 9.6 11/22/2019   MG 1.9 12/20/2018     Bone Lab Results  Component Value Date   25OHVITD1 14 (L) 02/07/2018   25OHVITD2 <1.0 02/07/2018   25OHVITD3 14 02/07/2018     Inflammation (CRP: Acute Phase) (ESR: Chronic Phase) Lab Results  Component Value Date   CRP <0.8 02/07/2018   ESRSEDRATE 53 (H) 02/07/2018   LATICACIDVEN 1.6 12/18/2018       Note:  Above Lab results reviewed.  Recent Imaging Review  DG Lumbar Spine 2-3 Views CLINICAL DATA:  Lumbar back pain after fall. Tripped over grand daughter's toy last night leading to fall. Progressive pain.  EXAM: LUMBAR SPINE - 2-3 VIEW  COMPARISON:  Lumbar MRI 07/02/2019  FINDINGS: No acute fracture. There is slight straightening of normal lordosis. Grade 1 anterolisthesis of L5 on S1 is stable from prior MRI. Multilevel disc space narrowing and endplate spurring most prominent at L3-L4. Prominent lower lumbar facet hypertrophy. Vertebral body heights are normal. The sacroiliac joints are congruent.  IMPRESSION: 1. No acute fracture or subluxation of the lumbar spine. 2. Multilevel degenerative disc disease and facet hypertrophy.  Electronically Signed   By: Keith Rake M.D.   On: 08/22/2020 18:22 CT Head Wo Contrast CLINICAL DATA:  Fall, head injury, neck trauma  EXAM: CT HEAD WITHOUT CONTRAST  CT CERVICAL SPINE WITHOUT CONTRAST  TECHNIQUE: Multidetector CT imaging of the head and cervical spine was performed following the standard protocol without intravenous contrast. Multiplanar CT image reconstructions of the cervical spine were also generated.  COMPARISON:  None.  FINDINGS: CT HEAD FINDINGS  Brain: Normal anatomic configuration. Mild to moderate periventricular white matter changes are present likely reflecting the sequela of small vessel ischemia. Remote lacunar infarcts are noted within the basal ganglia bilaterally. No abnormal intra or extra-axial mass lesion or fluid collection. No abnormal mass effect or midline shift. No evidence of acute intracranial hemorrhage or infarct. Ventricular size is normal. Cerebellum unremarkable.  Vascular: Extensive vascular calcifications are noted within the carotid siphons and distal vertebral arteries bilaterally, however, there is no asymmetric vascular hyperdensity noted at the skull base.  Skull:  Intact  Sinuses/Orbits: There is mild layering fluid within the right sphenoid sinus. Remaining paranasal sinuses are clear. Paranasal sinuses are clear. Orbits are unremarkable.  Other: Mastoid air cells and middle ear cavities are clear.  CT CERVICAL SPINE FINDINGS  Alignment: There is 3 mm anterolisthesis of C4 upon C5 and minimal retrolisthesis of C5 upon C6 both likely degenerative in nature.  Skull base and vertebrae: Craniocervical junction is unremarkable. Atlantodental interval is normal. No acute fracture of the cervical spine. No lytic or blastic bone lesions are identified.  Soft tissues and spinal canal: No prevertebral fluid or swelling. No visible canal hematoma.  Disc levels: Review of the sagittal reformats demonstrates preservation of vertebral body height. There is mild intervertebral  disc space narrowing and endplate remodeling at K4-6 in keeping with changes of mild degenerative disc disease. Review of the axial images demonstrates moderate multilevel uncovertebral and facet arthrosis resulting in multilevel neural foraminal narrowing, most severe on the left at C3-4, on the right at C4-5, and bilaterally at C5-6 where this is of mild severity. No significant canal stenosis.  Upper chest: Unremarkable  Other: None signified  IMPRESSION: 1. No acute intracranial abnormality. 2. Mild to moderate periventricular white matter changes likely reflecting the sequela of small vessel ischemia. 3. Remote lacunar infarcts within the basal ganglia bilaterally. 4. No acute fracture or subluxation of the cervical spine. 5. Mild degenerative disc disease and facet arthrosis of the cervical spine, with mild multilevel neural foraminal narrowing as outlined above. 6. Mild layering fluid within the right sphenoid sinus.  Electronically Signed   By: Fidela Salisbury MD   On: 08/22/2020 16:45 CT Cervical Spine Wo Contrast CLINICAL DATA:  Fall, head injury, neck  trauma  EXAM: CT HEAD WITHOUT CONTRAST  CT CERVICAL SPINE WITHOUT CONTRAST  TECHNIQUE: Multidetector CT imaging of the head and cervical spine was performed following the standard protocol without intravenous contrast. Multiplanar CT image reconstructions of the cervical spine were also generated.  COMPARISON:  None.  FINDINGS: CT HEAD FINDINGS  Brain: Normal anatomic configuration. Mild to moderate periventricular white matter changes are present likely reflecting the sequela of small vessel ischemia. Remote lacunar infarcts are noted within the basal ganglia bilaterally. No abnormal intra or extra-axial mass lesion or fluid collection. No abnormal mass effect or midline shift. No evidence of acute intracranial hemorrhage or infarct. Ventricular size is normal. Cerebellum unremarkable.  Vascular: Extensive vascular calcifications are noted within the carotid siphons and distal vertebral arteries bilaterally, however, there is no asymmetric vascular hyperdensity noted at the skull base.  Skull: Intact  Sinuses/Orbits: There is mild layering fluid within the right sphenoid sinus. Remaining paranasal sinuses are clear. Paranasal sinuses are clear. Orbits are unremarkable.  Other: Mastoid air cells and middle ear cavities are clear.  CT CERVICAL SPINE FINDINGS  Alignment: There is 3 mm anterolisthesis of C4 upon C5 and minimal retrolisthesis of C5 upon C6 both likely degenerative in nature.  Skull base and vertebrae: Craniocervical junction is unremarkable. Atlantodental interval is normal. No acute fracture of the cervical spine. No lytic or blastic bone lesions are identified.  Soft tissues and spinal canal: No prevertebral fluid or swelling. No visible canal hematoma.  Disc levels: Review of the sagittal reformats demonstrates preservation of vertebral body height. There is mild intervertebral disc space narrowing and endplate remodeling at K8-6 in keeping  with changes of mild degenerative disc disease. Review of the axial images demonstrates moderate multilevel uncovertebral and facet arthrosis resulting in multilevel neural foraminal narrowing, most severe on the left at C3-4, on the right at C4-5, and bilaterally at C5-6 where this is of mild severity. No significant canal stenosis.  Upper chest: Unremarkable  Other: None signified  IMPRESSION: 1. No acute intracranial abnormality. 2. Mild to moderate periventricular white matter changes likely reflecting the sequela of small vessel ischemia. 3. Remote lacunar infarcts within the basal ganglia bilaterally. 4. No acute fracture or subluxation of the cervical spine. 5. Mild degenerative disc disease and facet arthrosis of the cervical spine, with mild multilevel neural foraminal narrowing as outlined above. 6. Mild layering fluid within the right sphenoid sinus.  Electronically Signed   By: Fidela Salisbury MD   On: 08/22/2020 16:45 Note: Reviewed  Physical Exam  General appearance: Well nourished, well developed, and well hydrated. In no apparent acute distress Mental status: Alert, oriented x 3 (person, place, & time)       Respiratory: No evidence of acute respiratory distress Eyes: PERLA Vitals: BP 138/83 (BP Location: Left Arm, Patient Position: Sitting, Cuff Size: Normal)   Pulse (!) 115   Temp (!) 97.4 F (36.3 C) (Temporal)   Resp 16   Ht $R'5\' 3"'zr$  (1.6 m)   Wt 160 lb (72.6 kg)   SpO2 100%   BMI 28.34 kg/m  BMI: Estimated body mass index is 28.34 kg/m as calculated from the following:   Height as of this encounter: $RemoveBeforeD'5\' 3"'ONFbBWRldOvbyh$  (1.6 m).   Weight as of this encounter: 160 lb (72.6 kg). Ideal: Ideal body weight: 52.4 kg (115 lb 8.3 oz) Adjusted ideal body weight: 60.5 kg (133 lb 5 oz)  Assessment   Status Diagnosis  Controlled Controlled Controlled 1. Chronic pain syndrome   2. DDD (degenerative disc disease), lumbosacral   3. Chronic low back pain (Secondary  Area of Pain) (Bilateral) (R>L) w/ sciatica (Right)   4. Chronic hip pain (Tertiary Area of Pain) (Bilateral) (R>L)   5. Lumbar foraminal stenosis (Bilateral: L2-S1)   6. Lumbosacral spondylosis with radiculopathy (Right)   7. History of allergy to radiographic contrast media   8. Lumbar facet syndrome (Bilateral) (R>L)   9. Lumbar facet arthropathy (L3-4, L4-5, L5-S1)   10. Grade 1 Anterolisthesis of L5/S1      Updated Problems: No problems updated.  Plan of Care  Problem-specific:  No problem-specific Assessment & Plan notes found for this encounter.  Jean Gomez has a current medication list which includes the following long-term medication(s): albuterol, albuterol sulfate, amlodipine, aripiprazole, atorvastatin, calcium carbonate, citalopram, dexilant, diclofenac sodium, diphenhydramine, hydrochlorothiazide, lidocaine, linaclotide, losartan, nitroglycerin, oxycodone, pantoprazole, potassium chloride sa, promethazine, tizanidine, torsemide, ipratropium, levetiracetam, levetiracetam, and sucralfate.  Pharmacotherapy (Medications Ordered): No orders of the defined types were placed in this encounter.  Orders:  Orders Placed This Encounter  Procedures  . LUMBAR FACET(MEDIAL BRANCH NERVE BLOCK) MBNB    Standing Status:   Future    Standing Expiration Date:   09/25/2020    Scheduling Instructions:     Procedure: Lumbar facet block (AKA.: Lumbosacral medial branch nerve block)     Side: Bilateral     Level: L3-4, L4-5, & L5-S1 Facets (L2, L3, L4, L5, & S1 Medial Branch Nerves)     Sedation: Patient's choice.     Timeframe: ASAA    Order Specific Question:   Where will this procedure be performed?    Answer:   [facility]   Follow-up plan:   Return for Procedure (w/ sedation): (B) L-FCT BLK.      Interventional treatment options:  Under consideration: NOTE: CONTRAST & FENTANYL Allergy. Diagnostic bilateral L2 and L3 TFESI #1  Diagnostic left L4 TFESI  #1 Diagnostic/therapeutic right L5-S1 IA synovial cyst facet joint injection #1  Diagnostic bilateral L5 TFESI #1  Possible bilateral occipital nerve RFA Possible bilateral occipital nerve peripheral nerves stimulator trial Diagnostic bilateral cervical facet block Possible bilateral cervical facet RFA Diagnostic right CESI Diagnostic bilateral suprascapular NB Possible bilateral suprascapular nerve RFA Diagnostic right LESI Diagnostic left IA hip joint injection Diagnostic bilateral femoral + obturator NB Possible bilateral femoral + obturator nerve RFA Diagnostic bilateral genicular NB Possible bilateral genicular RFA   Therapeutic/palliative (PRN): Diagnostic midline caudal ESI #2 (100/90/40/<50)  Diagnostic right IA hip joint injection #  2  Diagnostic right trochanteric bursa injection #2  Diagnostic bilateral IA shoulder joint injection #2 (100/100/50/75)  Diagnostic right L5 TFESI #2 (100/100/97/>50)  Diagnostic left L4-5 LESI #2 (100/100/97/>50)  Palliative bilateral lumbar facet blocks #5 (100/100/90/50)  Palliative right lumbar facet RFA #2 (last done 01/16/2019) Palliative left lumbar facet RFA #2 (last done 01/30/2019) Diagnostic bilateral GONB#2(100/0/25/50)  Palliative bilateral IA knee joint injection (w/ steroid)  Palliative bilateral IA Hyalgan knee injections(series#2) (series#1completed on 07/04/2018)  Palliative bilateral SI joint block #2      Recent Visits Date Type Provider Dept  07/22/20 Procedure visit Milinda Pointer, MD [facility]  07/20/20 Telemedicine Milinda Pointer, MD [facility]  07/01/20 Procedure visit Milinda Pointer, MD [facility]  06/17/20 Office Visit Milinda Pointer, MD [facility]  Showing recent visits within past 90 days and meeting all other requirements Today's Visits Date Type Provider Dept  08/26/20 Office Visit Milinda Pointer, MD [facility]   Showing today's visits and meeting all other requirements Future Appointments Date Type Provider Dept  09/23/20 Appointment Milinda Pointer, MD [facility]  Showing future appointments within next 90 days and meeting all other requirements  I discussed the assessment and treatment plan with the patient. The patient was provided an opportunity to ask questions and all were answered. The patient agreed with the plan and demonstrated an understanding of the instructions.  Patient advised to call back or seek an in-person evaluation if the symptoms or condition worsens.  Duration of encounter: 35 minutes.  Note by: Gaspar Cola, MD Date: 08/26/2020; Time: 4:53 PM

## 2020-08-26 NOTE — Patient Instructions (Addendum)
____________________________________________________________________________________________  Preparing for Procedure with Sedation  Procedure appointments are limited to planned procedures: . No Prescription Refills. . No disability issues will be discussed. . No medication changes will be discussed.  Instructions: . Oral Intake: Do not eat or drink anything for at least 8 hours prior to your procedure. (Exception: Blood Pressure Medication. See below.) . Transportation: Unless otherwise stated by your physician, you may drive yourself after the procedure. . Blood Pressure Medicine: Do not forget to take your blood pressure medicine with a sip of water the morning of the procedure. If your Diastolic (lower reading)is above 100 mmHg, elective cases will be cancelled/rescheduled. . Blood thinners: These will need to be stopped for procedures. Notify our staff if you are taking any blood thinners. Depending on which one you take, there will be specific instructions on how and when to stop it. . Diabetics on insulin: Notify the staff so that you can be scheduled 1st case in the morning. If your diabetes requires high dose insulin, take only  of your normal insulin dose the morning of the procedure and notify the staff that you have done so. . Preventing infections: Shower with an antibacterial soap the morning of your procedure. . Build-up your immune system: Take 1000 mg of Vitamin C with every meal (3 times a day) the day prior to your procedure. . Antibiotics: Inform the staff if you have a condition or reason that requires you to take antibiotics before dental procedures. . Pregnancy: If you are pregnant, call and cancel the procedure. . Sickness: If you have a cold, fever, or any active infections, call and cancel the procedure. . Arrival: You must be in the facility at least 30 minutes prior to your scheduled procedure. . Children: Do not bring children with you. . Dress appropriately:  Bring dark clothing that you would not mind if they get stained. . Valuables: Do not bring any jewelry or valuables.  Reasons to call and reschedule or cancel your procedure: (Following these recommendations will minimize the risk of a serious complication.) . Surgeries: Avoid having procedures within 2 weeks of any surgery. (Avoid for 2 weeks before or after any surgery). . Flu Shots: Avoid having procedures within 2 weeks of a flu shots or . (Avoid for 2 weeks before or after immunizations). . Barium: Avoid having a procedure within 7-10 days after having had a radiological study involving the use of radiological contrast. (Myelograms, Barium swallow or enema study). . Heart attacks: Avoid any elective procedures or surgeries for the initial 6 months after a "Myocardial Infarction" (Heart Attack). . Blood thinners: It is imperative that you stop these medications before procedures. Let us know if you if you take any blood thinner.  . Infection: Avoid procedures during or within two weeks of an infection (including chest colds or gastrointestinal problems). Symptoms associated with infections include: Localized redness, fever, chills, night sweats or profuse sweating, burning sensation when voiding, cough, congestion, stuffiness, runny nose, sore throat, diarrhea, nausea, vomiting, cold or Flu symptoms, recent or current infections. It is specially important if the infection is over the area that we intend to treat. . Heart and lung problems: Symptoms that may suggest an active cardiopulmonary problem include: cough, chest pain, breathing difficulties or shortness of breath, dizziness, ankle swelling, uncontrolled high or unusually low blood pressure, and/or palpitations. If you are experiencing any of these symptoms, cancel your procedure and contact your primary care physician for an evaluation.  Remember:  Regular Business hours are:    Monday to Thursday 8:00 AM to 4:00 PM  Provider's  Schedule: Azul Brumett, MD:  Procedure days: Tuesday and Thursday 7:30 AM to 4:00 PM  Bilal Lateef, MD:  Procedure days: Monday and Wednesday 7:30 AM to 4:00 PM ____________________________________________________________________________________________   Preparing for Procedure with Sedation Instructions: . Oral Intake: Do not eat or drink anything for at least 8 hours prior to your procedure. . Transportation: Public transportation is not allowed. Bring an adult driver. The driver must be physically present in our waiting room before any procedure can be started. . Physical Assistance: Bring an adult capable of physically assisting you, in the event you need help. . Blood Pressure Medicine: Take your blood pressure medicine with a sip of water the morning of the procedure. . Insulin: Take only  of your normal insulin dose. . Preventing infections: Shower with an antibacterial soap the morning of your procedure. . Build-up your immune system: Take 1000 mg of Vitamin C with every meal (3 times a day) the day prior to your procedure. . Pregnancy: If you are pregnant, call and cancel the procedure. . Sickness: If you have a cold, fever, or any active infections, call and cancel the procedure. . Arrival: You must be in the facility at least 30 minutes prior to your scheduled procedure. . Children: Do not bring children with you. . Dress appropriately: Bring dark clothing that you would not mind if they get stained. . Valuables: Do not bring any jewelry or valuables. Procedure appointments are reserved for interventional treatments only. . No Prescription Refills. . No medication changes will be discussed during procedure appointments. . No disability issues will be discussed. .  

## 2020-09-08 ENCOUNTER — Encounter: Payer: Medicare Other | Attending: Internal Medicine | Admitting: Internal Medicine

## 2020-09-08 ENCOUNTER — Other Ambulatory Visit: Payer: Self-pay

## 2020-09-08 DIAGNOSIS — G4733 Obstructive sleep apnea (adult) (pediatric): Secondary | ICD-10-CM | POA: Diagnosis not present

## 2020-09-08 DIAGNOSIS — L89319 Pressure ulcer of right buttock, unspecified stage: Secondary | ICD-10-CM | POA: Diagnosis present

## 2020-09-08 DIAGNOSIS — I671 Cerebral aneurysm, nonruptured: Secondary | ICD-10-CM | POA: Insufficient documentation

## 2020-09-08 DIAGNOSIS — D863 Sarcoidosis of skin: Secondary | ICD-10-CM | POA: Diagnosis not present

## 2020-09-13 NOTE — Progress Notes (Signed)
TRANIKA, SCHOLLER ([id number]) Visit Report for 09/08/2020 Chief Complaint Document Details Patient Name: Jean Gomez, Jean [name] Date of Service: 09/08/2020 9:15 AM Medical Record Number: [id number] Patient Account Number: [id number] Date of Birth/Sex: September 22, 1956 (64 y.o. F) Treating RN: Tyler Aas Primary Care Provider: Bluford Main Other Clinician: Referring Provider: Bluford Main Treating Provider/Extender: Altamese [place] in Treatment: 0 Information Obtained from: Patient Chief Complaint 09/08/2018; patient is here for complaints of a recurrent wound area on her right buttock Electronic Signature(s) Signed: 09/13/2020 9:55:55 AM By: Baltazar Najjar MD Entered By: Baltazar Najjar on 09/08/2020 09:53:43 [place], Lajoyce [place]ers ([id number]) -------------------------------------------------------------------------------- HPI Details Patient Name: Jean [name] Date of Service: 09/08/2020 9:15 AM Medical Record Number: [id number] Patient Account Number: [id number] Date of Birth/Sex: 01/14/1956 (63 y.o. F) Treating RN: Tyler Aas Primary Care Provider: Bluford Main Other Clinician: Referring Provider: Bluford Main Treating Provider/Extender: Altamese [place] in Treatment: 0 History of Present Illness HPI Description: 09/08/2020; ADMISSION This is a 64 year old woman who comes to [place] today referred by her primary physician apparently for recurrent lesions on her right buttock. These are always in the same spot in the lower right buttock in close proximity to the gluteal cleft. She states these will start his nodules. She finds them very pruritic and she will often rub them or scratch them and then they may drain and they usually close over when she uses topical antibiotics. This is been a recurrent phenomenon. She felt she had some pictures on her phone to show me but she could not come up with any. Unfortunately or fortunately at this time the area is  totally healed and except for some normal looking scar tissue there is nothing to really to see. He states that the recurrence of these have been going on for the past year off and on as described. Past medical history includes chronic low back pain, peptic ulcer disease, obstructive sleep apnea, COPD, migraines, seizure disorder, cerebral aneurysm. The patient says she has a long history of sarcoidosis dating back to 1975 although I did not see this in her records she said this was pulmonary. She is unaware that she had any spread of this in the other organ system specifically her skin Electronic Signature(s) Signed: 09/13/2020 9:55:55 AM By: Baltazar Najjar MD Entered By: Baltazar Najjar on 09/08/2020 10:00:36 Jean Gomez ([id number]) -------------------------------------------------------------------------------- Physical Exam Details Patient Name: Jean Gomez, Jean [name] Date of Service: 09/08/2020 9:15 AM Medical Record Number: [id number] Patient Account Number: [id number] Date of Birth/Sex: [birth year] (64 y.o. F) Treating RN: Tyler Aas Primary Care Provider: Bluford Main Other Clinician: Referring Provider: Bluford Main Treating Provider/Extender: Altamese  in Treatment: 0 Constitutional Sitting or standing Blood Pressure is within target range for patient.. Pulse regular and within target range for patient.Marland Kitchen Respirations regular, non- labored and within target range.. Temperature is normal and within the target range for the patient.Marland Kitchen appears in no distress. Integumentary (Hair, Skin) Nothing abnormal is seen in terms of systemic skin lesions.. Notes Wound exam; the areas in question is on the lower right gluteal. This is very healthy looking scar tissue here. There is certainly no evidence of a lesion nothing that is diagnostic. Areas in this area are usually pressure ulcers but there is certainly nothing to suggest this based on the  patient's history Electronic Signature(s) Signed: 09/13/2020 9:55:55 AM By: Baltazar Najjar MD Entered By: Baltazar Najjar on 09/08/2020 10:11:21 Jean Gomez ([id number]) -------------------------------------------------------------------------------- Physician Orders Details Patient Name: Jean [name] Date of  Service: 09/08/2020 9:15 AM Medical Record Number: [id number] Patient Account Number: [id number] Date of Birth/Sex: Feb 01, 1956 (64 y.o. F) Treating RN: Tyler Aas Primary Care Provider: Bluford Main Other Clinician: Referring Provider: Bluford Main Treating Provider/Extender: Altamese [place] in Treatment: 0 Verbal / Phone Orders: No Diagnosis Coding Discharge From [facility] Services o Discharge from Wound Care Center - consult only Electronic Signature(s) Signed: 09/08/2020 4:32:30 PM By: Tyler Aas Signed: 09/13/2020 9:55:55 AM By: Baltazar Najjar MD Entered By: Tyler Aas on 09/08/2020 09:49:30 Jean Gomez, Jean Gomez ([id number]) -------------------------------------------------------------------------------- Problem List Details Patient Name: Jean Gomez, Jean [name] Date of Service: 09/08/2020 9:15 AM Medical Record Number: [id number] Patient Account Number: [id number] Date of Birth/Sex: Apr 26, 1956 (64 y.o. F) Treating RN: Tyler Aas Primary Care Provider: Bluford Main Other Clinician: Referring Provider: Bluford Main Treating Provider/Extender: Altamese [place] in Treatment: 0 Active Problems ICD-10 Encounter Code Description Active Date MDM Diagnosis L89.319 Pressure ulcer of right buttock, unspecified stage 09/08/2020 No Yes D86.3 Sarcoidosis of skin 09/08/2020 No Yes Inactive Problems Resolved Problems Electronic Signature(s) Signed: 09/13/2020 9:55:55 AM By: Baltazar Najjar MD Entered By: Baltazar Najjar on 09/08/2020 09:52:54 Jean Gomez  ([id number]) -------------------------------------------------------------------------------- Progress Note Details Patient Name: Jean [name] Date of Service: 09/08/2020 9:15 AM Medical Record Number: [id number] Patient Account Number: [id number] Date of Birth/Sex: 29-Jun-1956 (64 y.o. F) Treating RN: Tyler Aas Primary Care Provider: Bluford Main Other Clinician: Referring Provider: Bluford Main Treating Provider/Extender: Altamese [place] in Treatment: 0 Subjective Chief Complaint Information obtained from Patient 09/08/2018; patient is here for complaints of a recurrent wound area on her right buttock History of Present Illness (HPI) 09/08/2020; ADMISSION This is a 64 year old woman who comes to [place] today referred by her primary physician apparently for recurrent lesions on her right buttock. These are always in the same spot in the lower right buttock in close proximity to the gluteal cleft. She states these will start his nodules. She finds them very pruritic and she will often rub them or scratch them and then they may drain and they usually close over when she uses topical antibiotics. This is been a recurrent phenomenon. She felt she had some pictures on her phone to show me but she could not come up with any. Unfortunately or fortunately at this time the area is totally healed and except for some normal looking scar tissue there is nothing to really to see. He states that the recurrence of these have been going on for the past year off and on as described. Past medical history includes chronic low back pain, peptic ulcer disease, obstructive sleep apnea, COPD, migraines, seizure disorder, cerebral aneurysm. The patient says she has a long history of sarcoidosis dating back to 1975 although I did not see this in her records she said this was pulmonary. She is unaware that she had any spread of this in the other organ system specifically her  skin Objective Constitutional Sitting or standing Blood Pressure is within target range for patient.. Pulse regular and within target range for patient.Marland Kitchen Respirations regular, non- labored and within target range.. Temperature is normal and within the target range for the patient.Marland Kitchen appears in no distress. Vitals Time Taken: 9:35 AM, Height: 63 in, Source: Stated, Weight: 160 lbs, Source: Stated, BMI: 28.3, Temperature: 98.9 F, Pulse: 105 bpm, Respiratory Rate: 16 breaths/min, Blood Pressure: 133/80 mmHg. General Notes: Wound exam; the areas in question is on the lower right gluteal. This is very healthy looking scar tissue here. There is certainly no  evidence of a lesion nothing that is diagnostic. Areas in this area are usually pressure ulcers but there is certainly nothing to suggest this based on the patient's history Integumentary (Hair, Skin) Nothing abnormal is seen in terms of systemic skin lesions.. Assessment Active Problems ICD-10 Pressure ulcer of right buttock, unspecified stage Sarcoidosis of skin Jean Gomez, Jean Gomez ([id number]) Plan Discharge From [facility] Services: Discharge from Wound Care Center - consult only 1. The patient can be discharged from the wound care center there is nothing here for [place] to follow 2. Unfortunately she did not have pictures on her phone that she could pull up today. 3. Have advised her to come back if indeed these are recurrent 4. I see nothing that looks like cutaneous sarcoid which I think was just an academic thought Electronic Signature(s) Signed: 09/13/2020 9:55:55 AM By: Baltazar Najjar MD Entered By: Baltazar Najjar on 09/08/2020 10:12:19 Jean Gomez ([id number]) -------------------------------------------------------------------------------- SuperBill Details Patient Name: Jean [name] Date of Service: 09/08/2020 Medical Record Number: [id number] Patient Account Number: [id number] Date of Birth/Sex: [birth year] (64 y.o.  F) Treating RN: Tyler Aas Primary Care Provider: Bluford Main Other Clinician: Referring Provider: Bluford Main Treating Provider/Extender: Altamese [place] in Treatment: 0 Diagnosis Coding ICD-10 Codes Code Description L89.319 Pressure ulcer of right buttock, unspecified stage D86.3 Sarcoidosis of skin Facility Procedures CPT4 Code: [id number] Description: [phone] - WOUND CARE VISIT-LEV 2 EST PT Modifier: Quantity: 1 Physician Procedures CPT4 Code: [id number] Description: 99202 - WC PHYS LEVEL 2 - NEW PT Modifier: Quantity: 1 CPT4 Code: Description: ICD-10 Diagnosis Description L89.319 Pressure ulcer of right buttock, unspecified stage D86.3 Sarcoidosis of skin Modifier: Quantity: Electronic Signature(s) Signed: 09/13/2020 9:55:55 AM By: Baltazar Najjar MD Entered By: Baltazar Najjar on 09/08/2020 10:12:51

## 2020-09-13 NOTE — Progress Notes (Signed)
Jean Gomez ([id number]) Visit Report for 09/08/2020 Arrival Information Details Patient Name: Jean, [name] Date of Service: 09/08/2020 9:15 AM Medical Record Number: [id number] Patient Account Number: [id number] Date of Birth/Sex: [birth year] (64 y.o. F) Treating RN: Tyler Aas Primary Care Levy Cedano: Bluford Main Other Clinician: Referring Chaitra Mast: Bluford Main Treating Venancio Chenier/Extender: Altamese [place] in Treatment: 0 Visit Information Patient Arrived: Ambulatory Arrival Time: 09:36 Accompanied By: self Transfer Assistance: None Patient Identification Verified: Yes Secondary Verification Process Completed: Yes Electronic Signature(s) Signed: 09/08/2020 10:01:18 AM By: Dayton Martes RCP, RRT, CHT Entered By: Dayton Martes on 09/08/2020 09:36:32 Jean Gomez ([id number]) -------------------------------------------------------------------------------- Clinic Level of Care Assessment Details Patient Name: Jean [name] Date of Service: 09/08/2020 9:15 AM Medical Record Number: [id number] Patient Account Number: [id number] Date of Birth/Sex: 14-Mar-1956 (64 y.o. F) Treating RN: Tyler Aas Primary Care Levelle Edelen: Bluford Main Other Clinician: Referring Paytin Ramakrishnan: Bluford Main Treating Byanka Landrus/Extender: Altamese [place] in Treatment: 0 Clinic Level of Care Assessment Items TOOL 4 Quantity Score []  - Use when only an EandM is performed on FOLLOW-UP visit 0 ASSESSMENTS - Nursing Assessment / Reassessment X - Reassessment of Co-morbidities (includes updates in patient status) 1 10 X- 1 5 Reassessment of Adherence to Treatment Plan ASSESSMENTS - Wound and Skin Assessment / Reassessment X - Simple Wound Assessment / Reassessment - one wound 1 5 []  - 0 Complex Wound Assessment / Reassessment - multiple wounds []  - 0 Dermatologic / Skin Assessment (not related to wound area) ASSESSMENTS -  Focused Assessment []  - Circumferential Edema Measurements - multi extremities 0 []  - 0 Nutritional Assessment / Counseling / Intervention []  - 0 Lower Extremity Assessment (monofilament, tuning fork, pulses) []  - 0 Peripheral Arterial Disease Assessment (using hand held doppler) ASSESSMENTS - Ostomy and/or Continence Assessment and Care []  - Incontinence Assessment and Management 0 []  - 0 Ostomy Care Assessment and Management (repouching, etc.) PROCESS - Coordination of Care []  - Simple Patient / Family Education for ongoing care 0 X- 1 20 Complex (extensive) Patient / Family Education for ongoing care []  - 0 Staff obtains , Records, Test Results / Process Orders []  - 0 Staff telephones HHA, Nursing Homes / Clarify orders / etc []  - 0 Routine Transfer to another Facility (non-emergent condition) []  - 0 Routine Hospital Admission (non-emergent condition) []  - 0 New Admissions / / Ordering NPWT, Apligraf, etc. X- 1 20 Emergency Hospital Admission (emergent condition) X- 1 10 Simple Discharge Coordination []  - 0 Complex (extensive) Discharge Coordination PROCESS - Special Needs []  - Pediatric / Minor Patient Management 0 []  - 0 Isolation Patient Management []  - 0 Hearing / Language / Visual special needs []  - 0 Assessment of Community assistance (transportation, D/C planning, etc.) []  - 0 Additional assistance / Altered mentation []  - 0 Support Surface(s) Assessment (bed, cushion, seat, etc.) INTERVENTIONS - Wound Cleansing / Measurement Jean [name] ( ) []  - 0 Simple Wound Cleansing - one wound []  - 0 Complex Wound Cleansing - multiple wounds []  - 0 Wound Imaging (photographs - any number of wounds) []  - 0 Wound Tracing (instead of photographs) []  - 0 Simple Wound Measurement - one wound []  - 0 Complex Wound Measurement - multiple wounds INTERVENTIONS - Wound Dressings []  - Small Wound Dressing one or multiple  wounds 0 []  - 0 Medium Wound Dressing one or multiple wounds []  - 0 Large Wound Dressing one or multiple wounds []  - 0 Application of Medications - topical []  -  0 Application of Medications - injection INTERVENTIONS - Miscellaneous []  - External ear exam 0 []  - 0 Specimen Collection (cultures, biopsies, blood, body fluids, etc.) []  - 0 Specimen(s) / Culture(s) sent or taken to Lab for analysis []  - 0 Patient Transfer (multiple staff / / Similar devices) []  - 0 Simple Staple / Suture removal (25 or less) []  - 0 Complex Staple / Suture removal (26 or more) []  - 0 Hypo / Hyperglycemic Management (close monitor of Blood Glucose) []  - 0 Ankle / Brachial Index (ABI) - do not check if billed separately X- 1 5 Vital Signs Has the patient been seen at the hospital within the last three years: Yes Total Score: 75 Level Of Care: New/Established - Level 2 Electronic Signature(s) Signed: 09/08/2020 4:32:30 PM By: Entered By: on 09/08/2020 09:50:18 ( ) -------------------------------------------------------------------------------- Encounter Discharge Information Details Patient Name: . Date of Service: 09/08/2020 9:15 AM Medical Record Number: 09/10/2020 Patient Account Number: Tyler Aas Date of Birth/Sex: [birth year] (64 y.o. F) Treating RN: Jean Gomez Primary Care Preethi Scantlebury: [phone] Other Clinician: Referring Jeevan Kalla: Jean Gomez Treating Melda Mermelstein/Extender: 09/10/2020 in Treatment: 0 Encounter Discharge Information Items Discharge Condition: Stable Ambulatory Status: Ambulatory Discharge Destination: Home Transportation: Private Auto Accompanied By: self Schedule Follow-up Appointment: Yes Clinical Summary of Care: Electronic Signature(s) Signed: 09/08/2020 4:32:30 PM By: [id number] Entered By: 04/03/1956 on 09/08/2020 09:50:43 Tyler Aas (Bluford Main) -------------------------------------------------------------------------------- Pain Assessment Details Patient Name: Bluford Main. Date of Service: 09/08/2020 9:15 AM Medical Record Number: 09/10/2020 Patient Account Number: Tyler Aas Date of Birth/Sex: 1956-06-19 (64 y.o. F) Treating RN: Jean Gomez Primary Care Mahalia Dykes: [phone] Other Clinician: Referring Jobie Popp: Jean Gomez Treating Donia Yokum/Extender: 09/10/2020 in Treatment: 0 Active Problems Location of Pain Severity and Description of Pain Patient Has Paino No Site Locations Pain Management and Medication Current Pain Management: Electronic Signature(s) Signed: 09/08/2020 10:01:18 AM By: [id number], RRT, CHT Signed: 09/08/2020 4:32:30 PM By: ([id number] Entered By: Tyler Aas on 09/08/2020 09:36:50 Bluford Main ([place]ames[place]) -------------------------------------------------------------------------------- Vitals Details Patient Name: 09/10/2020. Date of Service: 09/08/2020 9:15 AM Medical Record Number: 09/10/2020 Patient Account Number: Tyler Aas Date of Birth/Sex: 02-23-1956 (64 y.o. F) Treating RN: Jean Gomez Primary Care Ledger Heindl: [phone] Other Clinician: Referring Crosby Oriordan: Jean Gomez Treating Roper Tolson/Extender: 09/10/2020 in Treatment: 0 Vital Signs Time Taken: 09:35 Temperature (F): 98.9 Height (in): 63 Pulse (bpm): 105 Source: Stated Respiratory Rate (breaths/min): 16 Weight (lbs): 160 Blood Pressure (mmHg): 133/80 Source: Stated Reference Range: 80 - 120 mg / dl Body Mass Index (BMI): 28.3 Electronic Signature(s) Signed: 09/08/2020 10:01:18 AM By: [id number] RCP, RRT, CHT Entered By: 04/03/1956 on 09/08/2020 09:38:09

## 2020-09-23 ENCOUNTER — Encounter: Payer: Medicare Other | Admitting: Pain Medicine

## 2020-09-23 NOTE — Progress Notes (Signed)
No show

## 2020-09-23 NOTE — Patient Instructions (Signed)

## 2020-11-03 ENCOUNTER — Other Ambulatory Visit: Payer: Self-pay

## 2020-11-03 ENCOUNTER — Other Ambulatory Visit
Admission: RE | Admit: 2020-11-03 | Discharge: 2020-11-03 | Disposition: A | Discharge status: Home / Self Care | Payer: Medicare Other | Source: Ambulatory Visit | Attending: General Surgery | Admitting: General Surgery

## 2020-11-03 DIAGNOSIS — Z01812 Encounter for preprocedural laboratory examination: Secondary | ICD-10-CM | POA: Diagnosis present

## 2020-11-03 DIAGNOSIS — Z20822 Contact with and (suspected) exposure to covid-19: Secondary | ICD-10-CM | POA: Insufficient documentation

## 2020-11-03 LAB — SARS CORONAVIRUS 2 (TAT 6-24 HRS): SARS Coronavirus 2: NEGATIVE

## 2020-11-04 ENCOUNTER — Encounter: Payer: Self-pay | Admitting: General Surgery

## 2020-11-05 ENCOUNTER — Encounter: Payer: Self-pay | Admitting: General Surgery

## 2020-11-05 ENCOUNTER — Ambulatory Visit
Admission: RE | Admit: 2020-11-05 | Discharge: 2020-11-05 | Disposition: A | Discharge status: Home / Self Care | Payer: Medicare Other | Attending: General Surgery | Admitting: General Surgery

## 2020-11-05 ENCOUNTER — Ambulatory Visit: Payer: Medicare Other | Admitting: Anesthesiology

## 2020-11-05 ENCOUNTER — Encounter
Admission: RE | Disposition: A | Discharge status: Home / Self Care | Payer: Self-pay | Source: Home / Self Care | Attending: General Surgery

## 2020-11-05 ENCOUNTER — Other Ambulatory Visit: Payer: Self-pay

## 2020-11-05 DIAGNOSIS — Z7982 Long term (current) use of aspirin: Secondary | ICD-10-CM | POA: Diagnosis not present

## 2020-11-05 DIAGNOSIS — Z7951 Long term (current) use of inhaled steroids: Secondary | ICD-10-CM | POA: Diagnosis not present

## 2020-11-05 DIAGNOSIS — Z09 Encounter for follow-up examination after completed treatment for conditions other than malignant neoplasm: Secondary | ICD-10-CM | POA: Insufficient documentation

## 2020-11-05 DIAGNOSIS — Z9981 Dependence on supplemental oxygen: Secondary | ICD-10-CM | POA: Insufficient documentation

## 2020-11-05 DIAGNOSIS — Z79899 Other long term (current) drug therapy: Secondary | ICD-10-CM | POA: Insufficient documentation

## 2020-11-05 DIAGNOSIS — Z8711 Personal history of peptic ulcer disease: Secondary | ICD-10-CM | POA: Insufficient documentation

## 2020-11-05 HISTORY — DX: Constipation, unspecified: K59.00

## 2020-11-05 HISTORY — PX: ESOPHAGOGASTRODUODENOSCOPY (EGD) WITH PROPOFOL: SHX5813

## 2020-11-05 HISTORY — DX: Pure hypercholesterolemia, unspecified: E78.00

## 2020-11-05 HISTORY — DX: Chronic pain syndrome: G89.4

## 2020-11-05 SURGERY — ESOPHAGOGASTRODUODENOSCOPY (EGD) WITH PROPOFOL
Anesthesia: General

## 2020-11-05 MED ORDER — LIDOCAINE HCL (PF) 1 % IJ SOLN
INTRAMUSCULAR | Status: AC
  Administered 2020-11-05: 0.2 mL
  Filled 2020-11-05: qty 2

## 2020-11-05 MED ORDER — LIDOCAINE HCL (CARDIAC) PF 100 MG/5ML IV SOSY
PREFILLED_SYRINGE | INTRAVENOUS | Status: DC | PRN
  Administered 2020-11-05: 50 mg via INTRAVENOUS

## 2020-11-05 MED ORDER — SODIUM CHLORIDE 0.9 % IV SOLN: INTRAVENOUS | Status: DC

## 2020-11-05 MED ORDER — LIDOCAINE HCL (PF) 2 % IJ SOLN
INTRAMUSCULAR | Status: AC
  Filled 2020-11-05: qty 5

## 2020-11-05 MED ORDER — PROPOFOL 500 MG/50ML IV EMUL
INTRAVENOUS | Status: AC
  Filled 2020-11-05: qty 50

## 2020-11-05 MED ORDER — PROPOFOL 10 MG/ML IV BOLUS
INTRAVENOUS | Status: DC | PRN
  Administered 2020-11-05: 50 mg via INTRAVENOUS

## 2020-11-05 MED ORDER — PROPOFOL 500 MG/50ML IV EMUL
INTRAVENOUS | Status: DC | PRN
  Administered 2020-11-05: 150 ug/kg/min via INTRAVENOUS

## 2020-11-05 NOTE — H&P (Signed)
Jean Gomez [id number] 03/22/1956     HPI:  Patient identified with an 18 mm ulcer in the posterior gastric body at EGD in August. Biopsies were benign. For follow up exam to confirm healing.   Medications Prior to Admission  Medication Sig Dispense Refill Last Dose  . albuterol (PROVENTIL) (2.5 MG/3ML) 0.083% nebulizer solution Take 2.5 mg by nebulization every 6 (six) hours as needed for wheezing or shortness of breath.   11/04/2020 at 1830  . Albuterol Sulfate 108 (90 Base) MCG/ACT AEPB Inhale 2 puffs into the lungs every 6 (six) hours as needed.   11/04/2020 at 1830  . AMITIZA 24 MCG capsule Take 24 mcg by mouth 2 (two) times daily.   11/04/2020 at 0800  . amLODipine (NORVASC) 10 MG tablet Take 1 tablet (10 mg total) by mouth daily. 30 tablet 0 11/04/2020 at 0800  . ARIPiprazole (ABILIFY) 5 MG tablet Take 5 mg by mouth at bedtime.   11/04/2020 at 0800  . aspirin EC 81 MG tablet Take 1 tablet (81 mg total) by mouth daily. 30 tablet 0 11/04/2020 at 0800  . atorvastatin (LIPITOR) 20 MG tablet Take 20 mg by mouth daily.   11/04/2020 at 0800  . betamethasone, augmented, (DIPROLENE) 0.05 % lotion Apply topically 2 (two) times daily.   Past Week at Unknown time  . budesonide-formoterol (SYMBICORT) 160-4.5 MCG/ACT inhaler Inhale into the lungs.   11/04/2020 at 0800  . busPIRone (BUSPAR) 15 MG tablet Take 15 mg by mouth 2 (two) times daily.   11/04/2020 at 0800  . calcium carbonate (OS-CAL) 1250 (500 Ca) MG chewable tablet Chew by mouth daily.   11/04/2020 at 0800  . citalopram (CELEXA) 20 MG tablet Take 20 mg by mouth daily.   11/04/2020 at 0800  . clotrimazole-betamethasone (LOTRISONE) cream Apply 1 application topically 2 (two) times daily. 30 g 0 Past Week at +0800  . dexlansoprazole (DEXILANT) 60 MG capsule Take 60 mg by mouth daily.   11/04/2020 at 0800  . diphenhydrAMINE (BENADRYL) 25 MG tablet Take 25 mg by mouth every 6 (six) hours as needed.   Past Month at Unknown time  . Elastic  Bandages & Supports (MEDICAL COMPRESSION STOCKINGS) MISC Please provide compression stockings 1 each 0 Past Week at Unknown time  . hydrochlorothiazide (HYDRODIURIL) 25 MG tablet Take 1 tablet (25 mg total) by mouth daily. 30 tablet 0 11/04/2020 at 0800  . levETIRAcetam (KEPPRA) 500 MG tablet Take 750 mg by mouth 2 (two) times daily.    11/04/2020 at 0800  . levETIRAcetam (KEPPRA) 750 MG tablet Take 750 mg by mouth daily.    11/04/2020 at 0800  . losartan (COZAAR) 25 MG tablet Take 1 tablet (25 mg total) by mouth daily. 30 tablet 0 11/04/2020 at 0800  . nitroGLYCERIN (NITROSTAT) 0.4 MG SL tablet Place 0.4 mg under the tongue every 5 (five) minutes as needed for chest pain.   Past Week at Unknown time  . pantoprazole (PROTONIX) 40 MG tablet Take 1 tablet (40 mg total) by mouth daily. 30 tablet 0 11/04/2020 at 0800  . potassium chloride SA (K-DUR,KLOR-CON) 20 MEQ tablet Take 1 tablet (20 mEq total) by mouth 2 (two) times daily. 30 tablet 0 11/04/2020 at 0800  . promethazine (PHENERGAN) 25 MG tablet Take 25 mg by mouth every 6 (six) hours as needed for nausea or vomiting.   Past Week at Unknown time  . QUEtiapine (SEROQUEL) 400 MG tablet Take 400 mg by mouth at bedtime.  11/04/2020 at 2200  . torsemide (DEMADEX) 20 MG tablet Take 1 tablet (20 mg total) by mouth daily. 5 tablet 0 11/04/2020 at 0800  . venlafaxine (EFFEXOR) 75 MG tablet TK 1 T PO BID  3 11/04/2020 at 0800  . ALPRAZolam (XANAX) 0.25 MG tablet  (Patient not taking: Reported on 07/22/2020)   Completed Course at Unknown time  . diclofenac Sodium (VOLTAREN) 1 % GEL Apply 4 g topically 4 (four) times daily as needed. 350 g PRN   . hydrOXYzine (ATARAX/VISTARIL) 10 MG tablet Take 10 mg by mouth. (Patient not taking: Reported on 08/04/2020)   Completed Course at Unknown time  . ipratropium (ATROVENT) 0.02 % nebulizer solution Take 0.5 mg by nebulization every 4 (four) hours as needed for wheezing or shortness of breath. (Patient not taking:  Reported on 08/04/2020)   Completed Course at Unknown time  . lactulose (CHRONULAC) 10 GM/15ML solution Take 20 g by mouth 2 (two) times daily. (Patient not taking: Reported on 11/05/2020)   Completed Course  . lidocaine (XYLOCAINE) 5 % ointment Apply 1 application topically 4 (four) times daily as needed for moderate pain. 1 application = 6 in. of ointment = 5 g (Max: 20 g/day) 35.44 g 4   . linaclotide (LINZESS) 145 MCG CAPS capsule Take 145 mcg by mouth daily before breakfast. (Patient not taking: Reported on 11/05/2020)   Completed Course at Unknown time  . mirtazapine (REMERON) 15 MG tablet Take 15 mg by mouth at bedtime. (Patient not taking: Reported on 08/04/2020)   Completed Course at Unknown time  . oxyCODONE (OXY IR/ROXICODONE) 5 MG immediate release tablet Take 1 tablet (5 mg total) by mouth daily for 7 days. Max: 1/day. Must last 7 days. 7 tablet 0   . sucralfate (CARAFATE) 1 g tablet Take 1 tablet (1 g total) by mouth 4 (four) times daily. (Patient not taking: Reported on 08/04/2020) 120 tablet 0   . tiZANidine (ZANAFLEX) 4 MG tablet Take 1 tablet (4 mg total) by mouth 3 (three) times daily. 90 tablet 5   . traZODone (DESYREL) 50 MG tablet Take 50 mg by mouth at bedtime. (Patient not taking: Reported on 08/04/2020)      Allergies  Allergen Reactions  . Contrast Media [Iodinated Diagnostic Agents] Swelling  . Gabapentin   . Labetalol Other (See Comments)    Made hair fall out  . Sulfabenzamide Nausea Only  . Penicillins Rash  . Pregabalin Other (See Comments)    The patient complained of hemifacial numbness with the use of the medication.   Past Medical History:  Diagnosis Date  . Acute postoperative pain 01/16/2019  . Allergy   . Asthma   . Cerebral aneurysm   . Chest pain 11/01/2016  . Chronic migraine    Migraine  . Chronic pain syndrome   . Chronic respiratory failure (HCC)   . Constipation   . COPD (chronic obstructive pulmonary disease) (HCC)   . Depression    major  depression in remission  . Hypercholesterolemia   . Hypertension   . Osteoarthritis   . Pneumonia    12/2018  . Sarcoid   . Seizures (HCC)   . Sleep apnea    Past Surgical History:  Procedure Laterality Date  . ABDOMINAL HYSTERECTOMY    . BACK SURGERY     spine fusion anterior lumbar with open posterior  . CHOLECYSTECTOMY    . COLONOSCOPY WITH PROPOFOL N/A 08/19/2019   Procedure: COLONOSCOPY WITH PROPOFOL;  Surgeon: Christena Deem, MD;  Location: [facility] ENDOSCOPY;  Service: Endoscopy;  Laterality: N/A;  . ESOPHAGOGASTRODUODENOSCOPY (EGD) WITH PROPOFOL N/A 08/19/2019   Procedure: ESOPHAGOGASTRODUODENOSCOPY (EGD) WITH PROPOFOL;  Surgeon: Christena Deem, MD;  Location: [facility] ENDOSCOPY;  Service: Endoscopy;  Laterality: N/A;  . ESOPHAGOGASTRODUODENOSCOPY (EGD) WITH PROPOFOL N/A 08/04/2020   Procedure: ESOPHAGOGASTRODUODENOSCOPY (EGD) WITH PROPOFOL;  Surgeon: Earline Mayotte, MD;  Location: [facility] ENDOSCOPY;  Service: Endoscopy;  Laterality: N/A;  . SPINAL FUSION     Social History   Socioeconomic History  . Marital status: Single    Spouse name: Not on file  . Number of children: Not on file  . Years of education: Not on file  . Highest education level: Not on file  Occupational History  . Not on file  Tobacco Use  . Smoking status: Never Smoker  . Smokeless tobacco: Never Used  Vaping Use  . Vaping Use: Never used  Substance and Sexual Activity  . Alcohol use: Yes    Comment: rarely  . Drug use: No  . Sexual activity: Not on file  Other Topics Concern  . Not on file  Social History Narrative  . Not on file   Social Determinants of Health   Financial Resource Strain:   . Difficulty of Paying Living Expenses: Not on file  Food Insecurity:   . Worried About Programme researcher, broadcasting/film/video in the Last Year: Not on file  . Ran Out of Food in the Last Year: Not on file  Transportation Needs:   . Lack of Transportation (Medical): Not on file  . Lack of Transportation  (Non-Medical): Not on file  Physical Activity:   . Days of Exercise per Week: Not on file  . Minutes of Exercise per Session: Not on file  Stress:   . Feeling of Stress : Not on file  Social Connections:   . Frequency of Communication with Friends and Family: Not on file  . Frequency of Social Gatherings with Friends and Family: Not on file  . Attends Religious Services: Not on file  . Active Member of Clubs or Organizations: Not on file  . Attends Banker Meetings: Not on file  . Marital Status: Not on file  Intimate Partner Violence:   . Fear of Current or Ex-Partner: Not on file  . Emotionally Abused: Not on file  . Physically Abused: Not on file  . Sexually Abused: Not on file   Social History   Social History Narrative  . Not on file     ROS: Negative.     PE: HEENT: Negative. Lungs: Clear. Cardio: RR.  Assessment/Plan:  Proceed with planned upper endoscopy.  Merrily Pew [facility] 11/05/2020

## 2020-11-05 NOTE — Anesthesia Postprocedure Evaluation (Signed)
Anesthesia Post Note  Patient: Jean Gomez  Procedure(s) Performed: ESOPHAGOGASTRODUODENOSCOPY (EGD) WITH PROPOFOL (N/A )  Patient location during evaluation: Endoscopy Anesthesia Type: General Level of consciousness: awake and alert Pain management: pain level controlled Vital Signs Assessment: post-procedure vital signs reviewed and stable Respiratory status: spontaneous breathing, nonlabored ventilation, respiratory function stable and patient connected to nasal cannula oxygen Cardiovascular status: blood pressure returned to baseline and stable Postop Assessment: no apparent nausea or vomiting Anesthetic complications: no   No complications documented.   Last Vitals:  Vitals:   11/05/20 0950 11/05/20 1000  BP: 104/64 114/64  Pulse:    Resp: 14 18  Temp:    SpO2:      Last Pain:  Vitals:   11/05/20 0847  TempSrc: Temporal  PainSc: 9                  Lenard Simmer

## 2020-11-05 NOTE — Anesthesia Procedure Notes (Signed)
Date/Time: 11/05/2020 9:20 AM Performed by: Ginger Carne, CRNA Pre-anesthesia Checklist: Patient identified, Emergency Drugs available, Suction available, Patient being monitored and Timeout performed Patient Re-evaluated:Patient Re-evaluated prior to induction Oxygen Delivery Method: Simple face mask Preoxygenation: Pre-oxygenation with 100% oxygen Induction Type: IV induction

## 2020-11-05 NOTE — Anesthesia Preprocedure Evaluation (Addendum)
Anesthesia Evaluation  Patient identified by MRN, date of birth, ID band Patient awake    Reviewed: Allergy & Precautions, H&P , NPO status , Patient's Chart, lab work & pertinent test results  History of Anesthesia Complications Negative for: history of anesthetic complications  Airway Mallampati: II  TM Distance: >3 FB     Dental  (+) Upper Dentures, Lower Dentures   Pulmonary shortness of breath, at rest and Long-Term Oxygen Therapy, asthma , sleep apnea, Continuous Positive Airway Pressure Ventilation and Oxygen sleep apnea , COPD,  oxygen dependent,  Pulmonary sarcoidosis and COPD Wears 4L [place] while awake and CPAP while sleeping   breath sounds clear to auscultation       Cardiovascular hypertension, (-) angina(-) Past MI and (-) Cardiac Stents (-) dysrhythmias  Rhythm:regular Rate:Normal     Neuro/Psych  Headaches, Seizures -,  PSYCHIATRIC DISORDERS Anxiety Depression Bipolar Disorder    GI/Hepatic Neg liver ROS, hiatal hernia, GERD  ,  Endo/Other  negative endocrine ROS  Renal/GU negative Renal ROS  negative genitourinary   Musculoskeletal   Abdominal   Peds  Hematology negative hematology ROS (+)   Anesthesia Other Findings Past Medical History: 01/16/2019: Acute postoperative pain No date: Allergy No date: Asthma No date: Cerebral aneurysm 11/01/2016: Chest pain No date: Chronic migraine     Comment:  Migraine No date: Chronic pain syndrome No date: Chronic respiratory failure (HCC) No date: Constipation No date: COPD (chronic obstructive pulmonary disease) (HCC) No date: Depression     Comment:  major depression in remission No date: Hypercholesterolemia No date: Hypertension No date: Osteoarthritis No date: Pneumonia     Comment:  12/2018 No date: Sarcoid No date: Seizures (HCC) No date: Sleep apnea  Past Surgical History: No date: ABDOMINAL HYSTERECTOMY No date: BACK SURGERY     Comment:   spine fusion anterior lumbar with open posterior No date: CHOLECYSTECTOMY 08/19/2019: COLONOSCOPY WITH PROPOFOL; N/A     Comment:  Procedure: COLONOSCOPY WITH PROPOFOL;  Surgeon:               Christena Deem, MD;  Location: [facility] ENDOSCOPY;                Service: Endoscopy;  Laterality: N/A; 08/19/2019: ESOPHAGOGASTRODUODENOSCOPY (EGD) WITH PROPOFOL; N/A     Comment:  Procedure: ESOPHAGOGASTRODUODENOSCOPY (EGD) WITH               PROPOFOL;  Surgeon: Christena Deem, MD;  Location:               [facility] ENDOSCOPY;  Service: Endoscopy;  Laterality: N/A; 08/04/2020: ESOPHAGOGASTRODUODENOSCOPY (EGD) WITH PROPOFOL; N/A     Comment:  Procedure: ESOPHAGOGASTRODUODENOSCOPY (EGD) WITH               PROPOFOL;  Surgeon: Earline Mayotte, MD;  Location:               [facility] ENDOSCOPY;  Service: Endoscopy;  Laterality: N/A; No date: SPINAL FUSION     Reproductive/Obstetrics negative OB ROS                            Anesthesia Physical Anesthesia Plan  ASA: III  Anesthesia Plan: General   Post-op Pain Management:    Induction:   PONV Risk Score and Plan: Propofol infusion and TIVA  Airway Management Planned: Simple Face Mask  Additional Equipment:   Intra-op Plan:   Post-operative Plan:   Informed Consent: I have reviewed  the patients History and Physical, chart, labs and discussed the procedure including the risks, benefits and alternatives for the proposed anesthesia with the patient or authorized representative who has indicated his/her understanding and acceptance.     Dental Advisory Given  Plan Discussed with: Anesthesiologist, CRNA and Surgeon  Anesthesia Plan Comments:        Anesthesia Quick Evaluation

## 2020-11-05 NOTE — Op Note (Signed)
[facility] Patient Name: Jean Gomez Procedure Date: 11/05/2020 9:12 AM MRN: [id number] Account #: [id number] Date of Birth: [birth year] Admit Type: Outpatient Age: 64 Room: [facility] ENDO ROOM 1 Gender: Female Note Status: Finalized Procedure:             Upper GI endoscopy Indications:           Follow-up of acute gastric ulcer Providers:             Earline Mayotte, MD Referring MD:          Silas Flood. Ellsworth Lennox, MD (Referring MD) Medicines:             Monitored Anesthesia Care Procedure:             Pre-Anesthesia Assessment:                        - Prior to the procedure, a History and Physical was                         performed, and patient medications, allergies and                         sensitivities were reviewed. The patient's tolerance                         of previous anesthesia was reviewed.                        - The risks and benefits of the procedure and the                         sedation options and risks were discussed with the                         patient. All questions were answered and informed                         consent was obtained.                        After obtaining informed consent, the endoscope was                         passed under direct vision. Throughout the procedure,                         the patient's blood pressure, pulse, and oxygen                         saturations were monitored continuously. The Endoscope                         was introduced through the mouth, and advanced to the                         third part of duodenum. The upper GI endoscopy was                         accomplished without difficulty. The patient tolerated  the procedure well. Findings:      The esophagus was normal.      No evidence of residual gastric ulcer.      A small amount of food (residue) was found in the cardia. No biopsies or       other specimens were collected for  this exam.      The examined duodenum was normal. Impression:            - Normal esophagus.                        - A small amount of food (residue) in the stomach. No                         specimens collected.                        - Normal examined duodenum. Recommendation:        - Discharge patient to home (via wheelchair).                        - Avoid hard to digest foods. indefinitely. Procedure Code(s):     --- Professional ---                        [phone], Esophagogastroduodenoscopy, flexible,                         transoral; diagnostic, including collection of                         specimen(s) by brushing or washing, when performed                         (separate procedure) Diagnosis Code(s):     --- Professional ---                        K25.3, Acute gastric ulcer without hemorrhage or                         perforation CPT copyright 2019 American Medical Association. All rights reserved. The codes documented in this report are preliminary and upon coder review may  be revised to meet current compliance requirements. Earline Mayotte, MD 11/05/2020 9:41:27 AM This report has been signed electronically. Number of Addenda: 0 Note Initiated On: 11/05/2020 9:12 AM      [facility]

## 2020-11-05 NOTE — Transfer of Care (Signed)
Immediate Anesthesia Transfer of Care Note  Patient: STEPHANEY STEVEN  Procedure(s) Performed: ESOPHAGOGASTRODUODENOSCOPY (EGD) WITH PROPOFOL (N/A )  Patient Location: PACU  Anesthesia Type:General  Level of Consciousness: awake and alert   Airway & Oxygen Therapy: Patient Spontanous Breathing and Patient connected to nasal cannula oxygen  Post-op Assessment: Report given to RN and Post -op Vital signs reviewed and stable  Post vital signs: Reviewed and stable  Last Vitals:  Vitals Value Taken Time  BP 120/98 11/05/20 0942  Temp    Pulse 99 11/05/20 0944  Resp 14 11/05/20 0944  SpO2 100 % 11/05/20 0944  Vitals shown include unvalidated device data.  Last Pain:  Vitals:   11/05/20 0847  TempSrc: Temporal  PainSc: 9          Complications: No complications documented.

## 2021-03-30 ENCOUNTER — Other Ambulatory Visit: Payer: Self-pay | Admitting: Pain Medicine

## 2021-03-30 DIAGNOSIS — M170 Bilateral primary osteoarthritis of knee: Secondary | ICD-10-CM

## 2021-03-30 DIAGNOSIS — M8949 Other hypertrophic osteoarthropathy, multiple sites: Secondary | ICD-10-CM

## 2021-03-30 DIAGNOSIS — M159 Polyosteoarthritis, unspecified: Secondary | ICD-10-CM

## 2021-10-03 ENCOUNTER — Other Ambulatory Visit: Payer: Self-pay | Admitting: Internal Medicine

## 2021-10-03 DIAGNOSIS — R928 Other abnormal and inconclusive findings on diagnostic imaging of breast: Secondary | ICD-10-CM

## 2021-10-04 ENCOUNTER — Other Ambulatory Visit: Payer: Self-pay | Admitting: *Deleted

## 2021-10-04 ENCOUNTER — Inpatient Hospital Stay
Admission: RE | Admit: 2021-10-04 | Discharge: 2021-10-04 | Disposition: A | Discharge status: Home / Self Care | Payer: Self-pay | Source: Ambulatory Visit | Attending: *Deleted | Admitting: *Deleted

## 2021-10-04 DIAGNOSIS — Z1231 Encounter for screening mammogram for malignant neoplasm of breast: Secondary | ICD-10-CM

## 2021-10-05 ENCOUNTER — Other Ambulatory Visit: Payer: Self-pay | Admitting: Internal Medicine

## 2021-10-05 DIAGNOSIS — R928 Other abnormal and inconclusive findings on diagnostic imaging of breast: Secondary | ICD-10-CM

## 2021-10-19 ENCOUNTER — Ambulatory Visit
Admission: RE | Admit: 2021-10-19 | Discharge: 2021-10-19 | Disposition: A | Discharge status: Home / Self Care | Payer: 59 | Source: Ambulatory Visit | Attending: Internal Medicine | Admitting: Internal Medicine

## 2021-10-19 ENCOUNTER — Other Ambulatory Visit: Payer: Self-pay

## 2021-10-19 DIAGNOSIS — R928 Other abnormal and inconclusive findings on diagnostic imaging of breast: Secondary | ICD-10-CM | POA: Insufficient documentation

## 2021-12-27 DIAGNOSIS — J449 Chronic obstructive pulmonary disease, unspecified: Secondary | ICD-10-CM | POA: Diagnosis not present

## 2021-12-29 DIAGNOSIS — M545 Low back pain, unspecified: Secondary | ICD-10-CM | POA: Diagnosis not present

## 2021-12-29 DIAGNOSIS — M25562 Pain in left knee: Secondary | ICD-10-CM | POA: Diagnosis not present

## 2021-12-29 DIAGNOSIS — M25561 Pain in right knee: Secondary | ICD-10-CM | POA: Diagnosis not present

## 2021-12-29 DIAGNOSIS — Z79899 Other long term (current) drug therapy: Secondary | ICD-10-CM | POA: Diagnosis not present

## 2021-12-29 DIAGNOSIS — G8929 Other chronic pain: Secondary | ICD-10-CM | POA: Diagnosis not present

## 2022-01-02 DIAGNOSIS — G8921 Chronic pain due to trauma: Secondary | ICD-10-CM | POA: Diagnosis not present

## 2022-01-02 DIAGNOSIS — G894 Chronic pain syndrome: Secondary | ICD-10-CM | POA: Diagnosis not present

## 2022-01-02 DIAGNOSIS — G8929 Other chronic pain: Secondary | ICD-10-CM | POA: Diagnosis not present

## 2022-01-14 DIAGNOSIS — G4733 Obstructive sleep apnea (adult) (pediatric): Secondary | ICD-10-CM | POA: Diagnosis not present

## 2022-01-26 DIAGNOSIS — M25561 Pain in right knee: Secondary | ICD-10-CM | POA: Diagnosis not present

## 2022-01-26 DIAGNOSIS — G8929 Other chronic pain: Secondary | ICD-10-CM | POA: Diagnosis not present

## 2022-01-26 DIAGNOSIS — Z79899 Other long term (current) drug therapy: Secondary | ICD-10-CM | POA: Diagnosis not present

## 2022-01-26 DIAGNOSIS — M545 Low back pain, unspecified: Secondary | ICD-10-CM | POA: Diagnosis not present

## 2022-01-26 DIAGNOSIS — M25562 Pain in left knee: Secondary | ICD-10-CM | POA: Diagnosis not present

## 2022-01-27 DIAGNOSIS — J449 Chronic obstructive pulmonary disease, unspecified: Secondary | ICD-10-CM | POA: Diagnosis not present

## 2022-02-14 DIAGNOSIS — G4733 Obstructive sleep apnea (adult) (pediatric): Secondary | ICD-10-CM | POA: Diagnosis not present

## 2022-02-20 DIAGNOSIS — G8929 Other chronic pain: Secondary | ICD-10-CM | POA: Diagnosis not present

## 2022-02-20 DIAGNOSIS — M1993 Secondary osteoarthritis, unspecified site: Secondary | ICD-10-CM | POA: Diagnosis not present

## 2022-02-20 DIAGNOSIS — D860 Sarcoidosis of lung: Secondary | ICD-10-CM | POA: Diagnosis not present

## 2022-02-20 DIAGNOSIS — G894 Chronic pain syndrome: Secondary | ICD-10-CM | POA: Diagnosis not present

## 2022-02-20 DIAGNOSIS — Z79891 Long term (current) use of opiate analgesic: Secondary | ICD-10-CM | POA: Diagnosis not present

## 2022-02-20 DIAGNOSIS — G8921 Chronic pain due to trauma: Secondary | ICD-10-CM | POA: Diagnosis not present

## 2022-02-20 DIAGNOSIS — M4807 Spinal stenosis, lumbosacral region: Secondary | ICD-10-CM | POA: Diagnosis not present

## 2022-02-22 DIAGNOSIS — M5136 Other intervertebral disc degeneration, lumbar region: Secondary | ICD-10-CM | POA: Diagnosis not present

## 2022-02-22 DIAGNOSIS — Z9049 Acquired absence of other specified parts of digestive tract: Secondary | ICD-10-CM | POA: Diagnosis not present

## 2022-02-22 DIAGNOSIS — M4317 Spondylolisthesis, lumbosacral region: Secondary | ICD-10-CM | POA: Diagnosis not present

## 2022-02-22 DIAGNOSIS — M7918 Myalgia, other site: Secondary | ICD-10-CM | POA: Diagnosis not present

## 2022-02-22 DIAGNOSIS — M545 Low back pain, unspecified: Secondary | ICD-10-CM | POA: Diagnosis not present

## 2022-02-22 DIAGNOSIS — M533 Sacrococcygeal disorders, not elsewhere classified: Secondary | ICD-10-CM | POA: Diagnosis not present

## 2022-02-22 DIAGNOSIS — G8929 Other chronic pain: Secondary | ICD-10-CM | POA: Diagnosis not present

## 2022-02-22 DIAGNOSIS — M461 Sacroiliitis, not elsewhere classified: Secondary | ICD-10-CM | POA: Diagnosis not present

## 2022-02-22 DIAGNOSIS — M47816 Spondylosis without myelopathy or radiculopathy, lumbar region: Secondary | ICD-10-CM | POA: Diagnosis not present

## 2022-02-23 DIAGNOSIS — G8929 Other chronic pain: Secondary | ICD-10-CM | POA: Diagnosis not present

## 2022-02-23 DIAGNOSIS — M545 Low back pain, unspecified: Secondary | ICD-10-CM | POA: Diagnosis not present

## 2022-02-23 DIAGNOSIS — M25562 Pain in left knee: Secondary | ICD-10-CM | POA: Diagnosis not present

## 2022-02-23 DIAGNOSIS — Z79899 Other long term (current) drug therapy: Secondary | ICD-10-CM | POA: Diagnosis not present

## 2022-02-23 DIAGNOSIS — M25561 Pain in right knee: Secondary | ICD-10-CM | POA: Diagnosis not present

## 2022-02-24 DIAGNOSIS — J449 Chronic obstructive pulmonary disease, unspecified: Secondary | ICD-10-CM | POA: Diagnosis not present

## 2022-03-13 DIAGNOSIS — G8929 Other chronic pain: Secondary | ICD-10-CM | POA: Diagnosis not present

## 2022-03-13 DIAGNOSIS — M533 Sacrococcygeal disorders, not elsewhere classified: Secondary | ICD-10-CM | POA: Diagnosis not present

## 2022-03-13 DIAGNOSIS — M7918 Myalgia, other site: Secondary | ICD-10-CM | POA: Diagnosis not present

## 2022-03-21 DIAGNOSIS — D869 Sarcoidosis, unspecified: Secondary | ICD-10-CM | POA: Diagnosis not present

## 2022-03-21 DIAGNOSIS — J439 Emphysema, unspecified: Secondary | ICD-10-CM | POA: Diagnosis not present

## 2022-03-21 DIAGNOSIS — R0600 Dyspnea, unspecified: Secondary | ICD-10-CM | POA: Diagnosis not present

## 2022-03-21 DIAGNOSIS — R0609 Other forms of dyspnea: Secondary | ICD-10-CM | POA: Diagnosis not present

## 2022-03-23 DIAGNOSIS — M25562 Pain in left knee: Secondary | ICD-10-CM | POA: Diagnosis not present

## 2022-03-23 DIAGNOSIS — G8929 Other chronic pain: Secondary | ICD-10-CM | POA: Diagnosis not present

## 2022-03-23 DIAGNOSIS — Z79899 Other long term (current) drug therapy: Secondary | ICD-10-CM | POA: Diagnosis not present

## 2022-03-23 DIAGNOSIS — M545 Low back pain, unspecified: Secondary | ICD-10-CM | POA: Diagnosis not present

## 2022-03-23 DIAGNOSIS — M25561 Pain in right knee: Secondary | ICD-10-CM | POA: Diagnosis not present

## 2022-03-27 DIAGNOSIS — J449 Chronic obstructive pulmonary disease, unspecified: Secondary | ICD-10-CM | POA: Diagnosis not present

## 2022-04-20 DIAGNOSIS — Z79899 Other long term (current) drug therapy: Secondary | ICD-10-CM | POA: Diagnosis not present

## 2022-04-20 DIAGNOSIS — M545 Low back pain, unspecified: Secondary | ICD-10-CM | POA: Diagnosis not present

## 2022-04-20 DIAGNOSIS — G8929 Other chronic pain: Secondary | ICD-10-CM | POA: Diagnosis not present

## 2022-04-20 DIAGNOSIS — M25561 Pain in right knee: Secondary | ICD-10-CM | POA: Diagnosis not present

## 2022-04-20 DIAGNOSIS — M25562 Pain in left knee: Secondary | ICD-10-CM | POA: Diagnosis not present

## 2022-04-26 DIAGNOSIS — J449 Chronic obstructive pulmonary disease, unspecified: Secondary | ICD-10-CM | POA: Diagnosis not present

## 2022-05-05 DIAGNOSIS — E782 Mixed hyperlipidemia: Secondary | ICD-10-CM | POA: Diagnosis not present

## 2022-05-05 DIAGNOSIS — I10 Essential (primary) hypertension: Secondary | ICD-10-CM | POA: Diagnosis not present

## 2022-05-05 DIAGNOSIS — D649 Anemia, unspecified: Secondary | ICD-10-CM | POA: Diagnosis not present

## 2022-05-08 DIAGNOSIS — I10 Essential (primary) hypertension: Secondary | ICD-10-CM | POA: Diagnosis not present

## 2022-05-08 DIAGNOSIS — R609 Edema, unspecified: Secondary | ICD-10-CM | POA: Diagnosis not present

## 2022-05-08 DIAGNOSIS — G4700 Insomnia, unspecified: Secondary | ICD-10-CM | POA: Diagnosis not present

## 2022-05-08 DIAGNOSIS — R569 Unspecified convulsions: Secondary | ICD-10-CM | POA: Diagnosis not present

## 2022-05-08 DIAGNOSIS — E782 Mixed hyperlipidemia: Secondary | ICD-10-CM | POA: Diagnosis not present

## 2022-05-08 DIAGNOSIS — K219 Gastro-esophageal reflux disease without esophagitis: Secondary | ICD-10-CM | POA: Diagnosis not present

## 2022-05-08 DIAGNOSIS — G3184 Mild cognitive impairment, so stated: Secondary | ICD-10-CM | POA: Diagnosis not present

## 2022-05-08 DIAGNOSIS — K5909 Other constipation: Secondary | ICD-10-CM | POA: Diagnosis not present

## 2022-05-15 DIAGNOSIS — Z1211 Encounter for screening for malignant neoplasm of colon: Secondary | ICD-10-CM | POA: Diagnosis not present

## 2022-05-16 DIAGNOSIS — G4733 Obstructive sleep apnea (adult) (pediatric): Secondary | ICD-10-CM | POA: Diagnosis not present

## 2022-05-16 DIAGNOSIS — J449 Chronic obstructive pulmonary disease, unspecified: Secondary | ICD-10-CM | POA: Diagnosis not present

## 2022-05-16 DIAGNOSIS — R0602 Shortness of breath: Secondary | ICD-10-CM | POA: Diagnosis not present

## 2022-05-18 DIAGNOSIS — G8929 Other chronic pain: Secondary | ICD-10-CM | POA: Diagnosis not present

## 2022-05-18 DIAGNOSIS — M25561 Pain in right knee: Secondary | ICD-10-CM | POA: Diagnosis not present

## 2022-05-18 DIAGNOSIS — Z79899 Other long term (current) drug therapy: Secondary | ICD-10-CM | POA: Diagnosis not present

## 2022-05-18 DIAGNOSIS — M25562 Pain in left knee: Secondary | ICD-10-CM | POA: Diagnosis not present

## 2022-05-18 DIAGNOSIS — M545 Low back pain, unspecified: Secondary | ICD-10-CM | POA: Diagnosis not present

## 2022-05-27 DIAGNOSIS — J449 Chronic obstructive pulmonary disease, unspecified: Secondary | ICD-10-CM | POA: Diagnosis not present

## 2022-06-01 DIAGNOSIS — G8929 Other chronic pain: Secondary | ICD-10-CM | POA: Diagnosis not present

## 2022-06-01 DIAGNOSIS — M7918 Myalgia, other site: Secondary | ICD-10-CM | POA: Diagnosis not present

## 2022-06-17 DIAGNOSIS — G8929 Other chronic pain: Secondary | ICD-10-CM | POA: Diagnosis not present

## 2022-06-17 DIAGNOSIS — M25562 Pain in left knee: Secondary | ICD-10-CM | POA: Diagnosis not present

## 2022-06-17 DIAGNOSIS — M25561 Pain in right knee: Secondary | ICD-10-CM | POA: Diagnosis not present

## 2022-06-17 DIAGNOSIS — Z79899 Other long term (current) drug therapy: Secondary | ICD-10-CM | POA: Diagnosis not present

## 2022-06-17 DIAGNOSIS — M545 Low back pain, unspecified: Secondary | ICD-10-CM | POA: Diagnosis not present

## 2022-06-26 DIAGNOSIS — J449 Chronic obstructive pulmonary disease, unspecified: Secondary | ICD-10-CM | POA: Diagnosis not present

## 2022-07-20 DIAGNOSIS — M25562 Pain in left knee: Secondary | ICD-10-CM | POA: Diagnosis not present

## 2022-07-20 DIAGNOSIS — G8929 Other chronic pain: Secondary | ICD-10-CM | POA: Diagnosis not present

## 2022-07-20 DIAGNOSIS — Z79899 Other long term (current) drug therapy: Secondary | ICD-10-CM | POA: Diagnosis not present

## 2022-07-20 DIAGNOSIS — J449 Chronic obstructive pulmonary disease, unspecified: Secondary | ICD-10-CM | POA: Diagnosis not present

## 2022-07-20 DIAGNOSIS — M7062 Trochanteric bursitis, left hip: Secondary | ICD-10-CM | POA: Diagnosis not present

## 2022-07-20 DIAGNOSIS — M25561 Pain in right knee: Secondary | ICD-10-CM | POA: Diagnosis not present

## 2022-07-20 DIAGNOSIS — M545 Low back pain, unspecified: Secondary | ICD-10-CM | POA: Diagnosis not present

## 2022-07-27 DIAGNOSIS — J449 Chronic obstructive pulmonary disease, unspecified: Secondary | ICD-10-CM | POA: Diagnosis not present

## 2022-08-14 DIAGNOSIS — G4700 Insomnia, unspecified: Secondary | ICD-10-CM | POA: Diagnosis not present

## 2022-08-14 DIAGNOSIS — K5909 Other constipation: Secondary | ICD-10-CM | POA: Diagnosis not present

## 2022-08-14 DIAGNOSIS — E782 Mixed hyperlipidemia: Secondary | ICD-10-CM | POA: Diagnosis not present

## 2022-08-14 DIAGNOSIS — R569 Unspecified convulsions: Secondary | ICD-10-CM | POA: Diagnosis not present

## 2022-08-14 DIAGNOSIS — K219 Gastro-esophageal reflux disease without esophagitis: Secondary | ICD-10-CM | POA: Diagnosis not present

## 2022-08-14 DIAGNOSIS — R609 Edema, unspecified: Secondary | ICD-10-CM | POA: Diagnosis not present

## 2022-08-14 DIAGNOSIS — G3184 Mild cognitive impairment, so stated: Secondary | ICD-10-CM | POA: Diagnosis not present

## 2022-08-14 DIAGNOSIS — D649 Anemia, unspecified: Secondary | ICD-10-CM | POA: Diagnosis not present

## 2022-08-17 DIAGNOSIS — G8929 Other chronic pain: Secondary | ICD-10-CM | POA: Diagnosis not present

## 2022-08-17 DIAGNOSIS — J449 Chronic obstructive pulmonary disease, unspecified: Secondary | ICD-10-CM | POA: Diagnosis not present

## 2022-08-17 DIAGNOSIS — M25562 Pain in left knee: Secondary | ICD-10-CM | POA: Diagnosis not present

## 2022-08-17 DIAGNOSIS — M25511 Pain in right shoulder: Secondary | ICD-10-CM | POA: Diagnosis not present

## 2022-08-17 DIAGNOSIS — M25561 Pain in right knee: Secondary | ICD-10-CM | POA: Diagnosis not present

## 2022-08-17 DIAGNOSIS — Z79899 Other long term (current) drug therapy: Secondary | ICD-10-CM | POA: Diagnosis not present

## 2022-08-17 DIAGNOSIS — M545 Low back pain, unspecified: Secondary | ICD-10-CM | POA: Diagnosis not present

## 2022-08-17 DIAGNOSIS — M7062 Trochanteric bursitis, left hip: Secondary | ICD-10-CM | POA: Diagnosis not present

## 2022-08-18 DIAGNOSIS — Z79899 Other long term (current) drug therapy: Secondary | ICD-10-CM | POA: Diagnosis not present

## 2022-08-27 DIAGNOSIS — J449 Chronic obstructive pulmonary disease, unspecified: Secondary | ICD-10-CM | POA: Diagnosis not present

## 2022-09-14 DIAGNOSIS — M25511 Pain in right shoulder: Secondary | ICD-10-CM | POA: Diagnosis not present

## 2022-09-14 DIAGNOSIS — M25562 Pain in left knee: Secondary | ICD-10-CM | POA: Diagnosis not present

## 2022-09-14 DIAGNOSIS — M25561 Pain in right knee: Secondary | ICD-10-CM | POA: Diagnosis not present

## 2022-09-14 DIAGNOSIS — M545 Low back pain, unspecified: Secondary | ICD-10-CM | POA: Diagnosis not present

## 2022-09-14 DIAGNOSIS — G8929 Other chronic pain: Secondary | ICD-10-CM | POA: Diagnosis not present

## 2022-09-14 DIAGNOSIS — M7062 Trochanteric bursitis, left hip: Secondary | ICD-10-CM | POA: Diagnosis not present

## 2022-09-14 DIAGNOSIS — J449 Chronic obstructive pulmonary disease, unspecified: Secondary | ICD-10-CM | POA: Diagnosis not present

## 2022-09-14 DIAGNOSIS — G894 Chronic pain syndrome: Secondary | ICD-10-CM | POA: Diagnosis not present

## 2022-09-26 DIAGNOSIS — J449 Chronic obstructive pulmonary disease, unspecified: Secondary | ICD-10-CM | POA: Diagnosis not present

## 2022-10-12 DIAGNOSIS — G894 Chronic pain syndrome: Secondary | ICD-10-CM | POA: Diagnosis not present

## 2022-10-12 DIAGNOSIS — J449 Chronic obstructive pulmonary disease, unspecified: Secondary | ICD-10-CM | POA: Diagnosis not present

## 2022-10-12 DIAGNOSIS — M25561 Pain in right knee: Secondary | ICD-10-CM | POA: Diagnosis not present

## 2022-10-12 DIAGNOSIS — G8929 Other chronic pain: Secondary | ICD-10-CM | POA: Diagnosis not present

## 2022-10-12 DIAGNOSIS — M545 Low back pain, unspecified: Secondary | ICD-10-CM | POA: Diagnosis not present

## 2022-10-12 DIAGNOSIS — M25511 Pain in right shoulder: Secondary | ICD-10-CM | POA: Diagnosis not present

## 2022-10-12 DIAGNOSIS — M25562 Pain in left knee: Secondary | ICD-10-CM | POA: Diagnosis not present

## 2022-10-12 DIAGNOSIS — M7062 Trochanteric bursitis, left hip: Secondary | ICD-10-CM | POA: Diagnosis not present

## 2022-10-27 DIAGNOSIS — J449 Chronic obstructive pulmonary disease, unspecified: Secondary | ICD-10-CM | POA: Diagnosis not present

## 2022-10-31 DIAGNOSIS — K219 Gastro-esophageal reflux disease without esophagitis: Secondary | ICD-10-CM | POA: Diagnosis not present

## 2022-10-31 DIAGNOSIS — D649 Anemia, unspecified: Secondary | ICD-10-CM | POA: Diagnosis not present

## 2022-10-31 DIAGNOSIS — E782 Mixed hyperlipidemia: Secondary | ICD-10-CM | POA: Diagnosis not present

## 2022-10-31 DIAGNOSIS — K5909 Other constipation: Secondary | ICD-10-CM | POA: Diagnosis not present

## 2022-10-31 DIAGNOSIS — R569 Unspecified convulsions: Secondary | ICD-10-CM | POA: Diagnosis not present

## 2022-10-31 DIAGNOSIS — G2581 Restless legs syndrome: Secondary | ICD-10-CM | POA: Diagnosis not present

## 2022-10-31 DIAGNOSIS — G4700 Insomnia, unspecified: Secondary | ICD-10-CM | POA: Diagnosis not present

## 2022-10-31 DIAGNOSIS — R609 Edema, unspecified: Secondary | ICD-10-CM | POA: Diagnosis not present

## 2022-11-16 DIAGNOSIS — Z0000 Encounter for general adult medical examination without abnormal findings: Secondary | ICD-10-CM | POA: Diagnosis not present

## 2022-11-16 DIAGNOSIS — M7062 Trochanteric bursitis, left hip: Secondary | ICD-10-CM | POA: Diagnosis not present

## 2022-11-16 DIAGNOSIS — M25562 Pain in left knee: Secondary | ICD-10-CM | POA: Diagnosis not present

## 2022-11-16 DIAGNOSIS — M25561 Pain in right knee: Secondary | ICD-10-CM | POA: Diagnosis not present

## 2022-11-16 DIAGNOSIS — M25511 Pain in right shoulder: Secondary | ICD-10-CM | POA: Diagnosis not present

## 2022-11-16 DIAGNOSIS — J449 Chronic obstructive pulmonary disease, unspecified: Secondary | ICD-10-CM | POA: Diagnosis not present

## 2022-11-26 DIAGNOSIS — J449 Chronic obstructive pulmonary disease, unspecified: Secondary | ICD-10-CM | POA: Diagnosis not present

## 2022-12-05 DIAGNOSIS — G8929 Other chronic pain: Secondary | ICD-10-CM | POA: Diagnosis not present

## 2022-12-05 DIAGNOSIS — M7062 Trochanteric bursitis, left hip: Secondary | ICD-10-CM | POA: Diagnosis not present

## 2022-12-05 DIAGNOSIS — M7061 Trochanteric bursitis, right hip: Secondary | ICD-10-CM | POA: Diagnosis not present

## 2022-12-05 DIAGNOSIS — M7918 Myalgia, other site: Secondary | ICD-10-CM | POA: Diagnosis not present

## 2022-12-14 DIAGNOSIS — G894 Chronic pain syndrome: Secondary | ICD-10-CM | POA: Diagnosis not present

## 2022-12-14 DIAGNOSIS — M545 Low back pain, unspecified: Secondary | ICD-10-CM | POA: Diagnosis not present

## 2022-12-14 DIAGNOSIS — M25511 Pain in right shoulder: Secondary | ICD-10-CM | POA: Diagnosis not present

## 2022-12-14 DIAGNOSIS — J449 Chronic obstructive pulmonary disease, unspecified: Secondary | ICD-10-CM | POA: Diagnosis not present

## 2022-12-14 DIAGNOSIS — M25562 Pain in left knee: Secondary | ICD-10-CM | POA: Diagnosis not present

## 2022-12-14 DIAGNOSIS — G8929 Other chronic pain: Secondary | ICD-10-CM | POA: Diagnosis not present

## 2022-12-14 DIAGNOSIS — M25561 Pain in right knee: Secondary | ICD-10-CM | POA: Diagnosis not present

## 2022-12-14 DIAGNOSIS — M7062 Trochanteric bursitis, left hip: Secondary | ICD-10-CM | POA: Diagnosis not present

## 2022-12-27 ENCOUNTER — Other Ambulatory Visit: Payer: Self-pay | Admitting: Specialist

## 2022-12-27 DIAGNOSIS — R918 Other nonspecific abnormal finding of lung field: Secondary | ICD-10-CM | POA: Diagnosis not present

## 2022-12-27 DIAGNOSIS — G4733 Obstructive sleep apnea (adult) (pediatric): Secondary | ICD-10-CM | POA: Diagnosis not present

## 2022-12-27 DIAGNOSIS — R042 Hemoptysis: Secondary | ICD-10-CM | POA: Diagnosis not present

## 2022-12-27 DIAGNOSIS — J449 Chronic obstructive pulmonary disease, unspecified: Secondary | ICD-10-CM | POA: Diagnosis not present

## 2022-12-27 DIAGNOSIS — J439 Emphysema, unspecified: Secondary | ICD-10-CM | POA: Diagnosis not present

## 2022-12-27 DIAGNOSIS — D869 Sarcoidosis, unspecified: Secondary | ICD-10-CM | POA: Diagnosis not present

## 2022-12-28 DIAGNOSIS — M7062 Trochanteric bursitis, left hip: Secondary | ICD-10-CM | POA: Diagnosis not present

## 2022-12-28 DIAGNOSIS — G8929 Other chronic pain: Secondary | ICD-10-CM | POA: Diagnosis not present

## 2022-12-28 DIAGNOSIS — M7918 Myalgia, other site: Secondary | ICD-10-CM | POA: Diagnosis not present

## 2022-12-28 DIAGNOSIS — M7061 Trochanteric bursitis, right hip: Secondary | ICD-10-CM | POA: Diagnosis not present

## 2023-01-15 DIAGNOSIS — J449 Chronic obstructive pulmonary disease, unspecified: Secondary | ICD-10-CM | POA: Diagnosis not present

## 2023-01-15 DIAGNOSIS — G8929 Other chronic pain: Secondary | ICD-10-CM | POA: Diagnosis not present

## 2023-01-15 DIAGNOSIS — G894 Chronic pain syndrome: Secondary | ICD-10-CM | POA: Diagnosis not present

## 2023-01-15 DIAGNOSIS — M25511 Pain in right shoulder: Secondary | ICD-10-CM | POA: Diagnosis not present

## 2023-01-15 DIAGNOSIS — M7062 Trochanteric bursitis, left hip: Secondary | ICD-10-CM | POA: Diagnosis not present

## 2023-01-15 DIAGNOSIS — M545 Low back pain, unspecified: Secondary | ICD-10-CM | POA: Diagnosis not present

## 2023-01-16 DIAGNOSIS — G308 Other Alzheimer's disease: Secondary | ICD-10-CM | POA: Diagnosis not present

## 2023-01-16 DIAGNOSIS — G4700 Insomnia, unspecified: Secondary | ICD-10-CM | POA: Diagnosis not present

## 2023-01-16 DIAGNOSIS — K5909 Other constipation: Secondary | ICD-10-CM | POA: Diagnosis not present

## 2023-01-16 DIAGNOSIS — K219 Gastro-esophageal reflux disease without esophagitis: Secondary | ICD-10-CM | POA: Diagnosis not present

## 2023-01-16 DIAGNOSIS — R569 Unspecified convulsions: Secondary | ICD-10-CM | POA: Diagnosis not present

## 2023-01-16 DIAGNOSIS — I10 Essential (primary) hypertension: Secondary | ICD-10-CM | POA: Diagnosis not present

## 2023-01-16 DIAGNOSIS — G2581 Restless legs syndrome: Secondary | ICD-10-CM | POA: Diagnosis not present

## 2023-01-16 DIAGNOSIS — E782 Mixed hyperlipidemia: Secondary | ICD-10-CM | POA: Diagnosis not present

## 2023-01-19 DIAGNOSIS — J449 Chronic obstructive pulmonary disease, unspecified: Secondary | ICD-10-CM | POA: Diagnosis not present

## 2023-01-23 ENCOUNTER — Other Ambulatory Visit: Payer: Self-pay | Admitting: Internal Medicine

## 2023-01-27 DIAGNOSIS — J449 Chronic obstructive pulmonary disease, unspecified: Secondary | ICD-10-CM | POA: Diagnosis not present

## 2023-02-15 DIAGNOSIS — G894 Chronic pain syndrome: Secondary | ICD-10-CM | POA: Diagnosis not present

## 2023-02-15 DIAGNOSIS — J449 Chronic obstructive pulmonary disease, unspecified: Secondary | ICD-10-CM | POA: Diagnosis not present

## 2023-02-15 DIAGNOSIS — G8929 Other chronic pain: Secondary | ICD-10-CM | POA: Diagnosis not present

## 2023-02-15 DIAGNOSIS — M545 Low back pain, unspecified: Secondary | ICD-10-CM | POA: Diagnosis not present

## 2023-02-15 DIAGNOSIS — M7062 Trochanteric bursitis, left hip: Secondary | ICD-10-CM | POA: Diagnosis not present

## 2023-02-15 DIAGNOSIS — M25511 Pain in right shoulder: Secondary | ICD-10-CM | POA: Diagnosis not present

## 2023-02-17 DIAGNOSIS — J449 Chronic obstructive pulmonary disease, unspecified: Secondary | ICD-10-CM | POA: Diagnosis not present

## 2023-02-22 ENCOUNTER — Other Ambulatory Visit: Payer: Self-pay

## 2023-02-25 DIAGNOSIS — J449 Chronic obstructive pulmonary disease, unspecified: Secondary | ICD-10-CM | POA: Diagnosis not present

## 2023-02-27 ENCOUNTER — Other Ambulatory Visit: Payer: Self-pay | Admitting: Internal Medicine

## 2023-02-27 DIAGNOSIS — G4733 Obstructive sleep apnea (adult) (pediatric): Secondary | ICD-10-CM | POA: Diagnosis not present

## 2023-02-27 DIAGNOSIS — J449 Chronic obstructive pulmonary disease, unspecified: Secondary | ICD-10-CM | POA: Diagnosis not present

## 2023-03-15 DIAGNOSIS — G894 Chronic pain syndrome: Secondary | ICD-10-CM | POA: Diagnosis not present

## 2023-03-15 DIAGNOSIS — M545 Low back pain, unspecified: Secondary | ICD-10-CM | POA: Diagnosis not present

## 2023-03-15 DIAGNOSIS — M25511 Pain in right shoulder: Secondary | ICD-10-CM | POA: Diagnosis not present

## 2023-03-15 DIAGNOSIS — Z79899 Other long term (current) drug therapy: Secondary | ICD-10-CM | POA: Diagnosis not present

## 2023-03-15 DIAGNOSIS — M7062 Trochanteric bursitis, left hip: Secondary | ICD-10-CM | POA: Diagnosis not present

## 2023-03-15 DIAGNOSIS — J449 Chronic obstructive pulmonary disease, unspecified: Secondary | ICD-10-CM | POA: Diagnosis not present

## 2023-03-15 DIAGNOSIS — G8929 Other chronic pain: Secondary | ICD-10-CM | POA: Diagnosis not present

## 2023-03-19 ENCOUNTER — Other Ambulatory Visit: Payer: Self-pay | Admitting: Internal Medicine

## 2023-03-20 DIAGNOSIS — J449 Chronic obstructive pulmonary disease, unspecified: Secondary | ICD-10-CM | POA: Diagnosis not present

## 2023-03-20 DIAGNOSIS — Z79899 Other long term (current) drug therapy: Secondary | ICD-10-CM | POA: Diagnosis not present

## 2023-03-21 DIAGNOSIS — G4733 Obstructive sleep apnea (adult) (pediatric): Secondary | ICD-10-CM | POA: Diagnosis not present

## 2023-03-21 DIAGNOSIS — G894 Chronic pain syndrome: Secondary | ICD-10-CM | POA: Diagnosis not present

## 2023-03-21 DIAGNOSIS — J449 Chronic obstructive pulmonary disease, unspecified: Secondary | ICD-10-CM | POA: Diagnosis not present

## 2023-03-21 DIAGNOSIS — Z9981 Dependence on supplemental oxygen: Secondary | ICD-10-CM | POA: Diagnosis not present

## 2023-03-28 DIAGNOSIS — J449 Chronic obstructive pulmonary disease, unspecified: Secondary | ICD-10-CM | POA: Diagnosis not present

## 2023-04-09 ENCOUNTER — Other Ambulatory Visit: Payer: Self-pay | Admitting: Internal Medicine

## 2023-04-13 ENCOUNTER — Other Ambulatory Visit: Payer: Self-pay | Admitting: Internal Medicine

## 2023-04-13 DIAGNOSIS — G8929 Other chronic pain: Secondary | ICD-10-CM | POA: Diagnosis not present

## 2023-04-13 DIAGNOSIS — M25562 Pain in left knee: Secondary | ICD-10-CM | POA: Diagnosis not present

## 2023-04-13 DIAGNOSIS — M545 Low back pain, unspecified: Secondary | ICD-10-CM | POA: Diagnosis not present

## 2023-04-13 DIAGNOSIS — M7062 Trochanteric bursitis, left hip: Secondary | ICD-10-CM | POA: Diagnosis not present

## 2023-04-13 DIAGNOSIS — M25561 Pain in right knee: Secondary | ICD-10-CM | POA: Diagnosis not present

## 2023-04-13 DIAGNOSIS — M25511 Pain in right shoulder: Secondary | ICD-10-CM | POA: Diagnosis not present

## 2023-04-13 DIAGNOSIS — J449 Chronic obstructive pulmonary disease, unspecified: Secondary | ICD-10-CM | POA: Diagnosis not present

## 2023-04-16 DIAGNOSIS — G4733 Obstructive sleep apnea (adult) (pediatric): Secondary | ICD-10-CM | POA: Diagnosis not present

## 2023-04-16 DIAGNOSIS — Z9981 Dependence on supplemental oxygen: Secondary | ICD-10-CM | POA: Diagnosis not present

## 2023-04-16 DIAGNOSIS — G894 Chronic pain syndrome: Secondary | ICD-10-CM | POA: Diagnosis not present

## 2023-04-16 DIAGNOSIS — J449 Chronic obstructive pulmonary disease, unspecified: Secondary | ICD-10-CM | POA: Diagnosis not present

## 2023-04-18 ENCOUNTER — Other Ambulatory Visit: Payer: 59

## 2023-04-18 ENCOUNTER — Other Ambulatory Visit: Payer: Self-pay | Admitting: Internal Medicine

## 2023-04-18 DIAGNOSIS — E782 Mixed hyperlipidemia: Secondary | ICD-10-CM | POA: Diagnosis not present

## 2023-04-18 DIAGNOSIS — D649 Anemia, unspecified: Secondary | ICD-10-CM | POA: Diagnosis not present

## 2023-04-19 DIAGNOSIS — J449 Chronic obstructive pulmonary disease, unspecified: Secondary | ICD-10-CM | POA: Diagnosis not present

## 2023-04-19 LAB — LIPID PANEL W/O CHOL/HDL RATIO
Cholesterol, Total: 191 mg/dL (ref 100–199)
HDL: 73 mg/dL (ref >39)
LDL Chol Calc (NIH): 101 mg/dL — ABNORMAL HIGH (ref 0–99)
Triglycerides: 96 mg/dL (ref 0–149)
VLDL Cholesterol Cal: 17 mg/dL (ref 5–40)

## 2023-04-19 LAB — CBC WITH DIFFERENTIAL/PLATELET
Basophils Absolute: 0 10*3/uL (ref 0.0–0.2)
Basos: 0 %
EOS (ABSOLUTE): 0 10*3/uL (ref 0.0–0.4)
Eos: 0 %
Hematocrit: 32.8 % — ABNORMAL LOW (ref 34.0–46.6)
Hemoglobin: 10.5 g/dL — ABNORMAL LOW (ref 11.1–15.9)
Immature Grans (Abs): 0 10*3/uL (ref 0.0–0.1)
Immature Granulocytes: 0 %
Lymphocytes Absolute: 3.4 10*3/uL — ABNORMAL HIGH (ref 0.7–3.1)
Lymphs: 38 %
MCH: 26.9 pg (ref 26.6–33.0)
MCHC: 32 g/dL (ref 31.5–35.7)
MCV: 84 fL (ref 79–97)
Monocytes Absolute: 0.8 10*3/uL (ref 0.1–0.9)
Monocytes: 9 %
Neutrophils Absolute: 4.8 10*3/uL (ref 1.4–7.0)
Neutrophils: 53 %
Platelets: 400 10*3/uL (ref 150–450)
RBC: 3.9 x10E6/uL (ref 3.77–5.28)
RDW: 15.1 % (ref 11.7–15.4)
WBC: 9 10*3/uL (ref 3.4–10.8)

## 2023-04-19 LAB — HEPATIC FUNCTION PANEL
ALT: 23 IU/L (ref 0–32)
AST: 20 IU/L (ref 0–40)
Albumin: 4.3 g/dL (ref 3.9–4.9)
Alkaline Phosphatase: 64 IU/L (ref 44–121)
Bilirubin Total: 0.2 mg/dL (ref 0.0–1.2)
Bilirubin, Direct: <0.1 mg/dL (ref 0.00–0.40)
Total Protein: 6.7 g/dL (ref 6.0–8.5)

## 2023-04-19 LAB — CK: Total CK: 62 U/L (ref 32–182)

## 2023-04-20 ENCOUNTER — Ambulatory Visit: Payer: 59 | Admitting: Internal Medicine

## 2023-04-20 ENCOUNTER — Other Ambulatory Visit: Payer: Self-pay | Admitting: Internal Medicine

## 2023-04-27 DIAGNOSIS — J449 Chronic obstructive pulmonary disease, unspecified: Secondary | ICD-10-CM | POA: Diagnosis not present

## 2023-04-30 ENCOUNTER — Encounter: Payer: Self-pay | Admitting: Internal Medicine

## 2023-04-30 ENCOUNTER — Ambulatory Visit (INDEPENDENT_AMBULATORY_CARE_PROVIDER_SITE_OTHER): Payer: 59 | Admitting: Internal Medicine

## 2023-04-30 VITALS — BP 120/82 | HR 106 | Ht 63.0 in | Wt 177.4 lb

## 2023-04-30 DIAGNOSIS — F5101 Primary insomnia: Secondary | ICD-10-CM | POA: Diagnosis not present

## 2023-04-30 DIAGNOSIS — E7800 Pure hypercholesterolemia, unspecified: Secondary | ICD-10-CM | POA: Diagnosis not present

## 2023-04-30 DIAGNOSIS — K219 Gastro-esophageal reflux disease without esophagitis: Secondary | ICD-10-CM

## 2023-04-30 MED ORDER — BELSOMRA 15 MG PO TABS
15.0000 mg | ORAL_TABLET | ORAL | 2 refills | Status: DC | PRN
Start: 2023-04-30 — End: 2023-05-09

## 2023-04-30 MED ORDER — PROMETHAZINE HCL 25 MG PO TABS
25.0000 mg | ORAL_TABLET | Freq: Four times a day (QID) | ORAL | 2 refills | Status: DC | PRN
Start: 2023-04-30 — End: 2024-03-17

## 2023-04-30 NOTE — Progress Notes (Signed)
Established Patient Office Visit  Subjective:  Patient ID: Jean Gomez, female    DOB: 1956-01-31  Age: 67 y.o. MRN: [id number]  Chief Complaint  Patient presents with   Follow-up    Follow up    No new complaints, here for lab review and medication refills. Reports that insomnia persists despite current tranquilizers.    No other concerns at this time.   Past Medical History:  Diagnosis Date   Acute postoperative pain 01/16/2019   Allergy    Asthma    Cerebral aneurysm    Chest pain 11/01/2016   Chronic migraine    Migraine   Chronic pain syndrome    Chronic respiratory failure (HCC)    Constipation    COPD (chronic obstructive pulmonary disease) (HCC)    Depression    major depression in remission   Hypercholesterolemia    Hypertension    Osteoarthritis    Pneumonia    12/2018   Sarcoid    Seizures (HCC)    Sleep apnea     Past Surgical History:  Procedure Laterality Date   ABDOMINAL HYSTERECTOMY     BACK SURGERY     spine fusion anterior lumbar with open posterior   CHOLECYSTECTOMY     COLONOSCOPY WITH PROPOFOL N/A 08/19/2019   Procedure: COLONOSCOPY WITH PROPOFOL;  Surgeon: Christena Deem, MD;  Location: [facility] ENDOSCOPY;  Service: Endoscopy;  Laterality: N/A;   ESOPHAGOGASTRODUODENOSCOPY (EGD) WITH PROPOFOL N/A 08/19/2019   Procedure: ESOPHAGOGASTRODUODENOSCOPY (EGD) WITH PROPOFOL;  Surgeon: Christena Deem, MD;  Location: [facility] ENDOSCOPY;  Service: Endoscopy;  Laterality: N/A;   ESOPHAGOGASTRODUODENOSCOPY (EGD) WITH PROPOFOL N/A 08/04/2020   Procedure: ESOPHAGOGASTRODUODENOSCOPY (EGD) WITH PROPOFOL;  Surgeon: Earline Mayotte, MD;  Location: [facility] ENDOSCOPY;  Service: Endoscopy;  Laterality: N/A;   ESOPHAGOGASTRODUODENOSCOPY (EGD) WITH PROPOFOL N/A 11/05/2020   Procedure: ESOPHAGOGASTRODUODENOSCOPY (EGD) WITH PROPOFOL;  Surgeon: Earline Mayotte, MD;  Location: [facility] ENDOSCOPY;  Service: Endoscopy;  Laterality: N/A;   SPINAL FUSION      Social  History   Socioeconomic History   Marital status: Single    Spouse name: Not on file   Number of children: Not on file   Years of education: Not on file   Highest education level: Not on file  Occupational History   Not on file  Tobacco Use   Smoking status: Never   Smokeless tobacco: Never  Vaping Use   Vaping Use: Never used  Substance and Sexual Activity   Alcohol use: Yes    Comment: rarely   Drug use: No   Sexual activity: Not on file  Other Topics Concern   Not on file  Social History Narrative   Not on file   Social Determinants of Health   Financial Resource Strain: Low Risk  (03/05/2018)   Overall Financial Resource Strain (CARDIA)    Difficulty of Paying Living Expenses: Not very hard  Food Insecurity: Food Insecurity Present (03/05/2018)   Hunger Vital Sign    Worried About Running Out of Food in the Last Year: Sometimes true    Ran Out of Food in the Last Year: Sometimes true  Transportation Needs: Unmet Transportation Needs (03/05/2018)   PRAPARE - Transportation    Lack of Transportation (Medical): Yes    Lack of Transportation (Non-Medical): Yes  Physical Activity: Unknown (03/05/2018)   Exercise Vital Sign    Days of Exercise per Week: Patient declined    Minutes of Exercise per Session: Patient declined  Stress: No Stress Concern  Present (03/05/2018)   [employer] of Occupational Health - Occupational Stress Questionnaire    Feeling of Stress : Only a little  Social Connections: Unknown (03/05/2018)   Social Connection and Isolation Panel [NHANES]    Frequency of Communication with Friends and Family: Patient declined    Frequency of Social Gatherings with Friends and Family: Patient declined    Attends Religious Services: Patient declined    Database administrator or Organizations: Patient declined    Attends Banker Meetings: Patient declined    Marital Status: Patient declined  Intimate Partner Violence: Unknown (03/05/2018)    Humiliation, Afraid, Rape, and Kick questionnaire    Fear of Current or Ex-Partner: Patient declined    Emotionally Abused: Patient declined    Physically Abused: Patient declined    Sexually Abused: Patient declined    Family History  Problem Relation Age of Onset   Dementia Mother    Breast cancer Mother    Throat cancer Father     Allergies  Allergen Reactions   Contrast Media [Iodinated Contrast Media] Swelling   Gabapentin    Labetalol Other (See Comments)    Made hair fall out   Sulfabenzamide Nausea Only   Penicillins Rash   Pregabalin Other (See Comments)    The patient complained of hemifacial numbness with the use of the medication.    Review of Systems  Constitutional: Negative.   HENT: Negative.    Eyes: Negative.   Respiratory: Negative.    Cardiovascular: Negative.   Gastrointestinal:  Positive for nausea.  Genitourinary: Negative.   Skin: Negative.   Neurological: Negative.   Endo/Heme/Allergies: Negative.   Psychiatric/Behavioral:  The patient has insomnia.        Objective:   BP 120/82   Pulse (!) 106   Ht 5\' 3"  (1.6 m)   Wt 177 lb 6.4 oz (80.5 kg)   SpO2 92%   BMI 31.42 kg/m   Vitals:   04/30/23 1128  BP: 120/82  Pulse: (!) 106  Height: 5\' 3"  (1.6 m)  Weight: 177 lb 6.4 oz (80.5 kg)  SpO2: 92%  BMI (Calculated): 31.43    Physical Exam Vitals reviewed.  Constitutional:      General: She is not in acute distress. HENT:     Head: Normocephalic.     Nose: Nose normal.     Mouth/Throat:     Mouth: Mucous membranes are moist.  Eyes:     Extraocular Movements: Extraocular movements intact.     Pupils: Pupils are equal, round, and reactive to light.  Cardiovascular:     Rate and Rhythm: Normal rate and regular rhythm.     Heart sounds: No murmur heard. Pulmonary:     Effort: Pulmonary effort is normal.     Breath sounds: No rhonchi or rales.  Abdominal:     General: Abdomen is flat.     Palpations: There is no hepatomegaly,  splenomegaly or mass.  Musculoskeletal:        General: Normal range of motion.     Cervical back: Normal range of motion. No tenderness.     Right lower leg: 1+ Pitting Edema present.     Left lower leg: 1+ Pitting Edema present.  Skin:    General: Skin is warm and dry.  Neurological:     General: No focal deficit present.     Mental Status: She is alert and oriented to person, place, and time.     Cranial Nerves: No cranial  nerve deficit.     Motor: No weakness.  Psychiatric:        Mood and Affect: Mood normal.        Behavior: Behavior normal.      No results found for any visits on 04/30/23.  Recent Results (from the past 2160 hour(Vernestine Brodhead))  CBC with Differential/Platelet     Status: Abnormal   Collection Time: 04/18/23  9:25 AM  Result Value Ref Range   WBC 9.0 3.4 - 10.8 x10E3/uL   RBC 3.90 3.77 - 5.28 x10E6/uL   Hemoglobin 10.5 (L) 11.1 - 15.9 g/dL   Hematocrit 16.1 (L) 09.6 - 46.6 %   MCV 84 79 - 97 fL   MCH 26.9 26.6 - 33.0 pg   MCHC 32.0 31.5 - 35.7 g/dL   RDW 04.5 40.9 - 81.1 %   Platelets 400 150 - 450 x10E3/uL   Neutrophils 53 Not Estab. %   Lymphs 38 Not Estab. %   Monocytes 9 Not Estab. %   Eos 0 Not Estab. %   Basos 0 Not Estab. %   Neutrophils Absolute 4.8 1.4 - 7.0 x10E3/uL   Lymphocytes Absolute 3.4 (H) 0.7 - 3.1 x10E3/uL   Monocytes Absolute 0.8 0.1 - 0.9 x10E3/uL   EOS (ABSOLUTE) 0.0 0.0 - 0.4 x10E3/uL   Basophils Absolute 0.0 0.0 - 0.2 x10E3/uL   Immature Granulocytes 0 Not Estab. %   Immature Grans (Abs) 0.0 0.0 - 0.1 x10E3/uL  Lipid Panel w/o Chol/HDL Ratio     Status: Abnormal   Collection Time: 04/18/23  9:25 AM  Result Value Ref Range   Cholesterol, Total 191 100 - 199 mg/dL   Triglycerides 96 0 - 149 mg/dL   HDL 73 >91 mg/dL   VLDL Cholesterol Cal 17 5 - 40 mg/dL   LDL Chol Calc (NIH) 478 (H) 0 - 99 mg/dL  Hepatic function panel     Status: None   Collection Time: 04/18/23  9:25 AM  Result Value Ref Range   Total Protein 6.7 6.0 - 8.5  g/dL   Albumin 4.3 3.9 - 4.9 g/dL   Bilirubin Total 0.2 0.0 - 1.2 mg/dL   Bilirubin, Direct <2.95 0.00 - 0.40 mg/dL   Alkaline Phosphatase 64 44 - 121 IU/L   AST 20 0 - 40 IU/L   ALT 23 0 - 32 IU/L  CK     Status: None   Collection Time: 04/18/23  9:25 AM  Result Value Ref Range   Total CK 62 32 - 182 U/L      Assessment & Plan:   Problem List Items Addressed This Visit       Digestive   GERD (gastroesophageal reflux disease) - Primary   Relevant Medications   promethazine (PHENERGAN) 25 MG tablet   Other Relevant Orders   CBC With Diff/Platelet     Other   Hypercholesterolemia   Relevant Orders   Comprehensive metabolic panel   Lipid panel   CK   Insomnia   Relevant Medications   Suvorexant (BELSOMRA) 15 MG TABS    Return in about 3 months (around 07/31/2023) for awv with labs prior.   Total time spent: 20 minutes  Luna Fuse, MD  04/30/2023

## 2023-05-02 ENCOUNTER — Other Ambulatory Visit: Payer: Self-pay | Admitting: Internal Medicine

## 2023-05-04 ENCOUNTER — Telehealth: Payer: Self-pay

## 2023-05-04 ENCOUNTER — Other Ambulatory Visit: Payer: Self-pay | Admitting: Internal Medicine

## 2023-05-04 NOTE — Telephone Encounter (Signed)
Patient called stating that the sleeping medication that you prescribed for her isnt working, she is requesting to have something different called in for her please advise

## 2023-05-09 ENCOUNTER — Encounter: Payer: Self-pay | Admitting: Internal Medicine

## 2023-05-09 ENCOUNTER — Ambulatory Visit (INDEPENDENT_AMBULATORY_CARE_PROVIDER_SITE_OTHER): Payer: 59 | Admitting: Internal Medicine

## 2023-05-09 VITALS — BP 130/90 | HR 95 | Ht 63.0 in | Wt 178.4 lb

## 2023-05-09 DIAGNOSIS — F5101 Primary insomnia: Secondary | ICD-10-CM | POA: Diagnosis not present

## 2023-05-09 MED ORDER — BELSOMRA 20 MG PO TABS
20.0000 mg | ORAL_TABLET | Freq: Every evening | ORAL | 2 refills | Status: DC
Start: 2023-05-09 — End: 2023-07-09

## 2023-05-09 NOTE — Progress Notes (Addendum)
Established Patient Office Visit  Subjective:  Patient ID: Jean Gomez, female    DOB: 08-28-1956  Age: 67 y.o. MRN: [id number]  Chief Complaint  Patient presents with   Follow-up    Sleeping meds not working    C/o insomnia which failed to improve with current dose of Belsomra.    No other concerns at this time.   Past Medical History:  Diagnosis Date   Acute postoperative pain 01/16/2019   Allergy    Asthma    Cerebral aneurysm    Chest pain 11/01/2016   Chronic migraine    Migraine   Chronic pain syndrome    Chronic respiratory failure (HCC)    Constipation    COPD (chronic obstructive pulmonary disease) (HCC)    Depression    major depression in remission   Hypercholesterolemia    Hypertension    Osteoarthritis    Pneumonia    12/2018   Sarcoid    Seizures (HCC)    Sleep apnea     Past Surgical History:  Procedure Laterality Date   ABDOMINAL HYSTERECTOMY     BACK SURGERY     spine fusion anterior lumbar with open posterior   CHOLECYSTECTOMY     COLONOSCOPY WITH PROPOFOL N/A 08/19/2019   Procedure: COLONOSCOPY WITH PROPOFOL;  Surgeon: Christena Deem, MD;  Location: [facility] ENDOSCOPY;  Service: Endoscopy;  Laterality: N/A;   ESOPHAGOGASTRODUODENOSCOPY (EGD) WITH PROPOFOL N/A 08/19/2019   Procedure: ESOPHAGOGASTRODUODENOSCOPY (EGD) WITH PROPOFOL;  Surgeon: Christena Deem, MD;  Location: [facility] ENDOSCOPY;  Service: Endoscopy;  Laterality: N/A;   ESOPHAGOGASTRODUODENOSCOPY (EGD) WITH PROPOFOL N/A 08/04/2020   Procedure: ESOPHAGOGASTRODUODENOSCOPY (EGD) WITH PROPOFOL;  Surgeon: Earline Mayotte, MD;  Location: [facility] ENDOSCOPY;  Service: Endoscopy;  Laterality: N/A;   ESOPHAGOGASTRODUODENOSCOPY (EGD) WITH PROPOFOL N/A 11/05/2020   Procedure: ESOPHAGOGASTRODUODENOSCOPY (EGD) WITH PROPOFOL;  Surgeon: Earline Mayotte, MD;  Location: [facility] ENDOSCOPY;  Service: Endoscopy;  Laterality: N/A;   SPINAL FUSION      Social History   Socioeconomic History    Marital status: Single    Spouse name: Not on file   Number of children: Not on file   Years of education: Not on file   Highest education level: Not on file  Occupational History   Not on file  Tobacco Use   Smoking status: Never   Smokeless tobacco: Never  Vaping Use   Vaping Use: Never used  Substance and Sexual Activity   Alcohol use: Yes    Comment: rarely   Drug use: No   Sexual activity: Not on file  Other Topics Concern   Not on file  Social History Narrative   Not on file   Social Determinants of Health   Financial Resource Strain: Low Risk  (03/05/2018)   Overall Financial Resource Strain (CARDIA)    Difficulty of Paying Living Expenses: Not very hard  Food Insecurity: Food Insecurity Present (03/05/2018)   Hunger Vital Sign    Worried About Running Out of Food in the Last Year: Sometimes true    Ran Out of Food in the Last Year: Sometimes true  Transportation Needs: Unmet Transportation Needs (03/05/2018)   PRAPARE - Transportation    Lack of Transportation (Medical): Yes    Lack of Transportation (Non-Medical): Yes  Physical Activity: Unknown (03/05/2018)   Exercise Vital Sign    Days of Exercise per Week: Patient declined    Minutes of Exercise per Session: Patient declined  Stress: No Stress Concern Present (03/05/2018)  [employer] of Occupational Health - Occupational Stress Questionnaire    Feeling of Stress : Only a little  Social Connections: Unknown (03/05/2018)   Social Connection and Isolation Panel [NHANES]    Frequency of Communication with Friends and Family: Patient declined    Frequency of Social Gatherings with Friends and Family: Patient declined    Attends Religious Services: Patient declined    Database administrator or Organizations: Patient declined    Attends Banker Meetings: Patient declined    Marital Status: Patient declined  Intimate Partner Violence: Unknown (03/05/2018)   Humiliation, Afraid, Rape, and Kick  questionnaire    Fear of Current or Ex-Partner: Patient declined    Emotionally Abused: Patient declined    Physically Abused: Patient declined    Sexually Abused: Patient declined    Family History  Problem Relation Age of Onset   Dementia Mother    Breast cancer Mother    Throat cancer Father     Allergies  Allergen Reactions   Contrast Media [Iodinated Contrast Media] Swelling   Gabapentin    Labetalol Other (See Comments)    Made hair fall out   Sulfabenzamide Nausea Only   Penicillins Rash   Pregabalin Other (See Comments)    The patient complained of hemifacial numbness with the use of the medication.    Review of Systems  All other systems reviewed and are negative.      Objective:   BP (!) 130/90   Pulse 95   Ht 5\' 3"  (1.6 m)   Wt 178 lb 6.4 oz (80.9 kg)   SpO2 95%   BMI 31.60 kg/m   Vitals:   05/09/23 1028  BP: (!) 130/90  Pulse: 95  Height: 5\' 3"  (1.6 m)  Weight: 178 lb 6.4 oz (80.9 kg)  SpO2: 95%  BMI (Calculated): 31.61    Physical Exam Vitals reviewed.  Constitutional:      General: She is not in acute distress. HENT:     Head: Normocephalic.     Nose: Nose normal.     Mouth/Throat:     Mouth: Mucous membranes are moist.  Eyes:     Extraocular Movements: Extraocular movements intact.     Pupils: Pupils are equal, round, and reactive to light.  Cardiovascular:     Rate and Rhythm: Normal rate and regular rhythm.     Heart sounds: No murmur heard. Pulmonary:     Effort: Pulmonary effort is normal.     Breath sounds: No rhonchi or rales.  Abdominal:     General: Abdomen is flat.     Palpations: There is no hepatomegaly, splenomegaly or mass.  Musculoskeletal:        General: Normal range of motion.     Cervical back: Normal range of motion. No tenderness.     Right lower leg: 1+ Pitting Edema present.     Left lower leg: 1+ Pitting Edema present.  Skin:    General: Skin is warm and dry.  Neurological:     General: No focal  deficit present.     Mental Status: She is alert and oriented to person, place, and time.     Cranial Nerves: No cranial nerve deficit.     Motor: No weakness.  Psychiatric:        Mood and Affect: Mood normal.        Behavior: Behavior normal.      No results found for any visits on 05/09/23.  Assessment & Plan:  Order wbc, stool analysis and fu prn Problem List Items Addressed This Visit       Other   Insomnia - Primary   Relevant Medications   Suvorexant (BELSOMRA) 20 MG TABS    No follow-ups on file.   Total time spent: 20 minutes  Luna Fuse, MD  05/09/2023   This document may have been prepared by [facility] Voice Recognition software and as such may include unintentional dictation errors.

## 2023-05-10 DIAGNOSIS — G8929 Other chronic pain: Secondary | ICD-10-CM | POA: Diagnosis not present

## 2023-05-10 DIAGNOSIS — M545 Low back pain, unspecified: Secondary | ICD-10-CM | POA: Diagnosis not present

## 2023-05-10 DIAGNOSIS — M7062 Trochanteric bursitis, left hip: Secondary | ICD-10-CM | POA: Diagnosis not present

## 2023-05-10 DIAGNOSIS — M25511 Pain in right shoulder: Secondary | ICD-10-CM | POA: Diagnosis not present

## 2023-05-10 DIAGNOSIS — M25562 Pain in left knee: Secondary | ICD-10-CM | POA: Diagnosis not present

## 2023-05-10 DIAGNOSIS — M25561 Pain in right knee: Secondary | ICD-10-CM | POA: Diagnosis not present

## 2023-05-10 DIAGNOSIS — J449 Chronic obstructive pulmonary disease, unspecified: Secondary | ICD-10-CM | POA: Diagnosis not present

## 2023-05-17 DIAGNOSIS — M7061 Trochanteric bursitis, right hip: Secondary | ICD-10-CM | POA: Diagnosis not present

## 2023-05-17 DIAGNOSIS — M7062 Trochanteric bursitis, left hip: Secondary | ICD-10-CM | POA: Diagnosis not present

## 2023-05-18 DIAGNOSIS — G4733 Obstructive sleep apnea (adult) (pediatric): Secondary | ICD-10-CM | POA: Diagnosis not present

## 2023-05-20 DIAGNOSIS — J449 Chronic obstructive pulmonary disease, unspecified: Secondary | ICD-10-CM | POA: Diagnosis not present

## 2023-05-21 ENCOUNTER — Ambulatory Visit (INDEPENDENT_AMBULATORY_CARE_PROVIDER_SITE_OTHER): Payer: 59 | Admitting: Internal Medicine

## 2023-05-21 VITALS — BP 112/60 | HR 113 | Ht 63.0 in | Wt 175.0 lb

## 2023-05-21 DIAGNOSIS — K529 Noninfective gastroenteritis and colitis, unspecified: Secondary | ICD-10-CM

## 2023-05-21 MED ORDER — LOPERAMIDE HCL 2 MG PO CAPS
2.0000 mg | ORAL_CAPSULE | ORAL | 0 refills | Status: AC | PRN
Start: 2023-05-21 — End: 2023-05-31

## 2023-05-21 NOTE — Progress Notes (Signed)
Established Patient Office Visit  Subjective:  Patient ID: Jean Gomez, female    DOB: [birth year]  Age: 67 y.o. MRN: [id number]  Chief Complaint  Patient presents with   Acute Visit    Throwing Up & Diarrhea x 15 days    C/o 8 day h/o of nausea, vomiting and diarrhea. Diarrhea is profuse with watery, pasty stools but no blood. Denies change in diet or any new otc. Admits to concomitant fever, chills and malaise.    No other concerns at this time.   Past Medical History:  Diagnosis Date   Acute postoperative pain 01/16/2019   Allergy    Asthma    Cerebral aneurysm    Chest pain 11/01/2016   Chronic migraine    Migraine   Chronic pain syndrome    Chronic respiratory failure (HCC)    Constipation    COPD (chronic obstructive pulmonary disease) (HCC)    Depression    major depression in remission   Hypercholesterolemia    Hypertension    Osteoarthritis    Pneumonia    12/2018   Sarcoid    Seizures (HCC)    Sleep apnea     Past Surgical History:  Procedure Laterality Date   ABDOMINAL HYSTERECTOMY     BACK SURGERY     spine fusion anterior lumbar with open posterior   CHOLECYSTECTOMY     COLONOSCOPY WITH PROPOFOL N/A 08/19/2019   Procedure: COLONOSCOPY WITH PROPOFOL;  Surgeon: Christena Deem, MD;  Location: [facility] ENDOSCOPY;  Service: Endoscopy;  Laterality: N/A;   ESOPHAGOGASTRODUODENOSCOPY (EGD) WITH PROPOFOL N/A 08/19/2019   Procedure: ESOPHAGOGASTRODUODENOSCOPY (EGD) WITH PROPOFOL;  Surgeon: Christena Deem, MD;  Location: [facility] ENDOSCOPY;  Service: Endoscopy;  Laterality: N/A;   ESOPHAGOGASTRODUODENOSCOPY (EGD) WITH PROPOFOL N/A 08/04/2020   Procedure: ESOPHAGOGASTRODUODENOSCOPY (EGD) WITH PROPOFOL;  Surgeon: Earline Mayotte, MD;  Location: [facility] ENDOSCOPY;  Service: Endoscopy;  Laterality: N/A;   ESOPHAGOGASTRODUODENOSCOPY (EGD) WITH PROPOFOL N/A 11/05/2020   Procedure: ESOPHAGOGASTRODUODENOSCOPY (EGD) WITH PROPOFOL;  Surgeon: Earline Mayotte, MD;   Location: [facility] ENDOSCOPY;  Service: Endoscopy;  Laterality: N/A;   SPINAL FUSION      Social History   Socioeconomic History   Marital status: Single    Spouse name: Not on file   Number of children: Not on file   Years of education: Not on file   Highest education level: Not on file  Occupational History   Not on file  Tobacco Use   Smoking status: Never   Smokeless tobacco: Never  Vaping Use   Vaping Use: Never used  Substance and Sexual Activity   Alcohol use: Yes    Comment: rarely   Drug use: No   Sexual activity: Not on file  Other Topics Concern   Not on file  Social History Narrative   Not on file   Social Determinants of Health   Financial Resource Strain: Low Risk  (03/05/2018)   Overall Financial Resource Strain (CARDIA)    Difficulty of Paying Living Expenses: Not very hard  Food Insecurity: Food Insecurity Present (03/05/2018)   Hunger Vital Sign    Worried About Running Out of Food in the Last Year: Sometimes true    Ran Out of Food in the Last Year: Sometimes true  Transportation Needs: Unmet Transportation Needs (03/05/2018)   PRAPARE - Transportation    Lack of Transportation (Medical): Yes    Lack of Transportation (Non-Medical): Yes  Physical Activity: Unknown (03/05/2018)   Exercise Vital Sign  Days of Exercise per Week: Patient declined    Minutes of Exercise per Session: Patient declined  Stress: No Stress Concern Present (03/05/2018)   [employer] of Occupational Health - Occupational Stress Questionnaire    Feeling of Stress : Only a little  Social Connections: Unknown (03/05/2018)   Social Connection and Isolation Panel [NHANES]    Frequency of Communication with Friends and Family: Patient declined    Frequency of Social Gatherings with Friends and Family: Patient declined    Attends Religious Services: Patient declined    Database administrator or Organizations: Patient declined    Attends Banker Meetings: Patient  declined    Marital Status: Patient declined  Intimate Partner Violence: Unknown (03/05/2018)   Humiliation, Afraid, Rape, and Kick questionnaire    Fear of Current or Ex-Partner: Patient declined    Emotionally Abused: Patient declined    Physically Abused: Patient declined    Sexually Abused: Patient declined    Family History  Problem Relation Age of Onset   Dementia Mother    Breast cancer Mother    Throat cancer Father     Allergies  Allergen Reactions   Contrast Media [Iodinated Contrast Media] Swelling   Gabapentin    Labetalol Other (See Comments)    Made hair fall out   Sulfabenzamide Nausea Only   Penicillins Rash   Pregabalin Other (See Comments)    The patient complained of hemifacial numbness with the use of the medication.    Review of Systems  All other systems reviewed and are negative.      Objective:   BP 112/60   Pulse (!) 113   Ht 5\' 3"  (1.6 m)   Wt 175 lb (79.4 kg)   SpO2 98%   BMI 31.00 kg/m   Vitals:   05/21/23 1127  BP: 112/60  Pulse: (!) 113  Height: 5\' 3"  (1.6 m)  Weight: 175 lb (79.4 kg)  SpO2: 98%  BMI (Calculated): 31.01    Physical Exam Abdominal:     General: Bowel sounds are normal. There is no distension.     Palpations: Abdomen is soft.     Tenderness: There is abdominal tenderness in the right upper quadrant and epigastric area. There is no rebound.      No results found for any visits on 05/21/23.  Recent Results (from the past 2160 hour(Sapir Lavey))  CBC with Differential/Platelet     Status: Abnormal   Collection Time: 04/18/23  9:25 AM  Result Value Ref Range   WBC 9.0 3.4 - 10.8 x10E3/uL   RBC 3.90 3.77 - 5.28 x10E6/uL   Hemoglobin 10.5 (L) 11.1 - 15.9 g/dL   Hematocrit 62.9 (L) 52.8 - 46.6 %   MCV 84 79 - 97 fL   MCH 26.9 26.6 - 33.0 pg   MCHC 32.0 31.5 - 35.7 g/dL   RDW 41.3 24.4 - 01.0 %   Platelets 400 150 - 450 x10E3/uL   Neutrophils 53 Not Estab. %   Lymphs 38 Not Estab. %   Monocytes 9 Not Estab. %    Eos 0 Not Estab. %   Basos 0 Not Estab. %   Neutrophils Absolute 4.8 1.4 - 7.0 x10E3/uL   Lymphocytes Absolute 3.4 (H) 0.7 - 3.1 x10E3/uL   Monocytes Absolute 0.8 0.1 - 0.9 x10E3/uL   EOS (ABSOLUTE) 0.0 0.0 - 0.4 x10E3/uL   Basophils Absolute 0.0 0.0 - 0.2 x10E3/uL   Immature Granulocytes 0 Not Estab. %   Immature Grans (  Abs) 0.0 0.0 - 0.1 x10E3/uL  Lipid Panel w/o Chol/HDL Ratio     Status: Abnormal   Collection Time: 04/18/23  9:25 AM  Result Value Ref Range   Cholesterol, Total 191 100 - 199 mg/dL   Triglycerides 96 0 - 149 mg/dL   HDL 73 >78 mg/dL   VLDL Cholesterol Cal 17 5 - 40 mg/dL   LDL Chol Calc (NIH) 295 (H) 0 - 99 mg/dL  Hepatic function panel     Status: None   Collection Time: 04/18/23  9:25 AM  Result Value Ref Range   Total Protein 6.7 6.0 - 8.5 g/dL   Albumin 4.3 3.9 - 4.9 g/dL   Bilirubin Total 0.2 0.0 - 1.2 mg/dL   Bilirubin, Direct <6.21 0.00 - 0.40 mg/dL   Alkaline Phosphatase 64 44 - 121 IU/L   AST 20 0 - 40 IU/L   ALT 23 0 - 32 IU/L  CK     Status: None   Collection Time: 04/18/23  9:25 AM  Result Value Ref Range   Total CK 62 32 - 182 U/L      Assessment & Plan:   As per problem list. Continue phenergan Problem List Items Addressed This Visit   None Visit Diagnoses     Gastroenteritis    -  Primary   Relevant Medications   loperamide (IMODIUM) 2 MG capsule   Other Relevant Orders   Comprehensive metabolic panel   Lipase   CBC With Diff/Platelet   Stool culture       Return in about 10 days (around 05/31/2023).   Total time spent: 30 minutes  Luna Fuse, MD  05/21/2023   This document may have been prepared by [facility] Voice Recognition software and as such may include unintentional dictation errors.

## 2023-05-22 ENCOUNTER — Other Ambulatory Visit: Payer: Self-pay | Admitting: Internal Medicine

## 2023-05-22 DIAGNOSIS — K529 Noninfective gastroenteritis and colitis, unspecified: Secondary | ICD-10-CM

## 2023-05-22 LAB — CBC WITH DIFF/PLATELET
Basophils Absolute: 0 10*3/uL (ref 0.0–0.2)
Basos: 0 %
EOS (ABSOLUTE): 0 10*3/uL (ref 0.0–0.4)
Eos: 0 %
Hematocrit: 30.2 % — ABNORMAL LOW (ref 34.0–46.6)
Hemoglobin: 9.6 g/dL — ABNORMAL LOW (ref 11.1–15.9)
Lymphocytes Absolute: 3.5 10*3/uL — ABNORMAL HIGH (ref 0.7–3.1)
Lymphs: 14 %
MCH: 26.4 pg — ABNORMAL LOW (ref 26.6–33.0)
MCHC: 31.8 g/dL (ref 31.5–35.7)
MCV: 83 fL (ref 79–97)
Monocytes Absolute: 1.3 10*3/uL — ABNORMAL HIGH (ref 0.1–0.9)
Monocytes: 5 %
Neutrophils Absolute: 19.5 10*3/uL — ABNORMAL HIGH (ref 1.4–7.0)
Neutrophils: 77 %
Platelets: 564 10*3/uL — ABNORMAL HIGH (ref 150–450)
RBC: 3.64 x10E6/uL — ABNORMAL LOW (ref 3.77–5.28)
RDW: 14.4 % (ref 11.7–15.4)
WBC: 25.3 10*3/uL (ref 3.4–10.8)

## 2023-05-22 LAB — COMPREHENSIVE METABOLIC PANEL
ALT: 51 IU/L — ABNORMAL HIGH (ref 0–32)
AST: 41 IU/L — ABNORMAL HIGH (ref 0–40)
Albumin/Globulin Ratio: 1.2 (ref 1.2–2.2)
Albumin: 3.7 g/dL — ABNORMAL LOW (ref 3.9–4.9)
Alkaline Phosphatase: 128 IU/L — ABNORMAL HIGH (ref 44–121)
BUN/Creatinine Ratio: 18 (ref 12–28)
BUN: 17 mg/dL (ref 8–27)
Bilirubin Total: <0.2 mg/dL (ref 0.0–1.2)
CO2: 20 mmol/L (ref 20–29)
Calcium: 9.4 mg/dL (ref 8.7–10.3)
Chloride: 103 mmol/L (ref 96–106)
Creatinine, Ser: 0.96 mg/dL (ref 0.57–1.00)
Globulin, Total: 3.1 g/dL (ref 1.5–4.5)
Glucose: 90 mg/dL (ref 70–99)
Potassium: 4.7 mmol/L (ref 3.5–5.2)
Sodium: 141 mmol/L (ref 134–144)
Total Protein: 6.8 g/dL (ref 6.0–8.5)
eGFR: 65 mL/min/{1.73_m2} (ref >59)

## 2023-05-22 LAB — LIPASE: Lipase: 42 U/L (ref 14–72)

## 2023-05-22 LAB — IMMATURE CELLS: Metamyelocytes: 4 % — ABNORMAL HIGH (ref 0–0)

## 2023-05-23 NOTE — Progress Notes (Signed)
Patient notified

## 2023-05-25 DIAGNOSIS — K529 Noninfective gastroenteritis and colitis, unspecified: Secondary | ICD-10-CM | POA: Diagnosis not present

## 2023-05-27 LAB — CLOSTRIDIUM DIFFICILE EIA: C difficile Toxins A+B, EIA: NEGATIVE

## 2023-05-28 ENCOUNTER — Other Ambulatory Visit: Payer: Self-pay | Admitting: Internal Medicine

## 2023-05-28 ENCOUNTER — Telehealth: Payer: Self-pay

## 2023-05-28 ENCOUNTER — Telehealth: Payer: Self-pay | Admitting: Internal Medicine

## 2023-05-28 DIAGNOSIS — J449 Chronic obstructive pulmonary disease, unspecified: Secondary | ICD-10-CM | POA: Diagnosis not present

## 2023-05-28 NOTE — Telephone Encounter (Signed)
Patient LM about her stool/ bw resutls states she's still really not feeling well does she need to go to the ED?

## 2023-05-28 NOTE — Telephone Encounter (Signed)
Patient called about stool test results. I see she has called earlier for the same thing. Please advise.

## 2023-05-29 DIAGNOSIS — R000 Tachycardia, unspecified: Secondary | ICD-10-CM | POA: Diagnosis not present

## 2023-05-29 DIAGNOSIS — G4733 Obstructive sleep apnea (adult) (pediatric): Secondary | ICD-10-CM | POA: Diagnosis not present

## 2023-05-29 DIAGNOSIS — D869 Sarcoidosis, unspecified: Secondary | ICD-10-CM | POA: Diagnosis not present

## 2023-05-29 DIAGNOSIS — R079 Chest pain, unspecified: Secondary | ICD-10-CM | POA: Diagnosis not present

## 2023-05-29 DIAGNOSIS — J439 Emphysema, unspecified: Secondary | ICD-10-CM | POA: Diagnosis not present

## 2023-05-29 LAB — STOOL CULTURE: E coli, Shiga toxin Assay: NEGATIVE

## 2023-05-29 NOTE — Progress Notes (Signed)
Patient notified

## 2023-05-30 ENCOUNTER — Other Ambulatory Visit: Payer: Self-pay | Admitting: Specialist

## 2023-05-30 ENCOUNTER — Other Ambulatory Visit
Admission: RE | Admit: 2023-05-30 | Discharge: 2023-05-30 | Disposition: A | Discharge status: Home / Self Care | Payer: 59 | Source: Ambulatory Visit | Attending: Specialist | Admitting: Specialist

## 2023-05-30 DIAGNOSIS — E538 Deficiency of other specified B group vitamins: Secondary | ICD-10-CM | POA: Diagnosis not present

## 2023-05-30 DIAGNOSIS — R079 Chest pain, unspecified: Secondary | ICD-10-CM | POA: Insufficient documentation

## 2023-05-30 DIAGNOSIS — Z955 Presence of coronary angioplasty implant and graft: Secondary | ICD-10-CM | POA: Diagnosis not present

## 2023-05-30 DIAGNOSIS — Z87891 Personal history of nicotine dependence: Secondary | ICD-10-CM | POA: Diagnosis not present

## 2023-05-30 DIAGNOSIS — R000 Tachycardia, unspecified: Secondary | ICD-10-CM | POA: Diagnosis not present

## 2023-05-30 DIAGNOSIS — R7989 Other specified abnormal findings of blood chemistry: Secondary | ICD-10-CM

## 2023-05-30 DIAGNOSIS — I5022 Chronic systolic (congestive) heart failure: Secondary | ICD-10-CM | POA: Diagnosis not present

## 2023-05-30 DIAGNOSIS — Z7984 Long term (current) use of oral hypoglycemic drugs: Secondary | ICD-10-CM | POA: Diagnosis not present

## 2023-05-30 DIAGNOSIS — I2510 Atherosclerotic heart disease of native coronary artery without angina pectoris: Secondary | ICD-10-CM | POA: Diagnosis not present

## 2023-05-30 DIAGNOSIS — Z7902 Long term (current) use of antithrombotics/antiplatelets: Secondary | ICD-10-CM | POA: Diagnosis not present

## 2023-05-30 DIAGNOSIS — I255 Ischemic cardiomyopathy: Secondary | ICD-10-CM | POA: Diagnosis not present

## 2023-05-30 DIAGNOSIS — R600 Localized edema: Secondary | ICD-10-CM | POA: Diagnosis not present

## 2023-05-30 DIAGNOSIS — Z7901 Long term (current) use of anticoagulants: Secondary | ICD-10-CM | POA: Diagnosis not present

## 2023-05-30 DIAGNOSIS — M109 Gout, unspecified: Secondary | ICD-10-CM | POA: Diagnosis not present

## 2023-05-30 DIAGNOSIS — N390 Urinary tract infection, site not specified: Secondary | ICD-10-CM | POA: Diagnosis not present

## 2023-05-30 DIAGNOSIS — I130 Hypertensive heart and chronic kidney disease with heart failure and stage 1 through stage 4 chronic kidney disease, or unspecified chronic kidney disease: Secondary | ICD-10-CM | POA: Diagnosis not present

## 2023-05-30 DIAGNOSIS — I4821 Permanent atrial fibrillation: Secondary | ICD-10-CM | POA: Diagnosis not present

## 2023-05-30 DIAGNOSIS — J439 Emphysema, unspecified: Secondary | ICD-10-CM | POA: Diagnosis not present

## 2023-05-30 DIAGNOSIS — Z79899 Other long term (current) drug therapy: Secondary | ICD-10-CM | POA: Diagnosis not present

## 2023-05-30 DIAGNOSIS — N183 Chronic kidney disease, stage 3 unspecified: Secondary | ICD-10-CM | POA: Diagnosis not present

## 2023-05-30 DIAGNOSIS — E1122 Type 2 diabetes mellitus with diabetic chronic kidney disease: Secondary | ICD-10-CM | POA: Diagnosis not present

## 2023-05-30 LAB — D-DIMER, QUANTITATIVE: D-Dimer, Quant: 1.84 ug/mL-FEU — ABNORMAL HIGH (ref 0.00–0.50)

## 2023-05-31 ENCOUNTER — Other Ambulatory Visit: Payer: Self-pay | Admitting: Specialist

## 2023-05-31 ENCOUNTER — Ambulatory Visit
Admission: RE | Admit: 2023-05-31 | Discharge: 2023-05-31 | Disposition: A | Discharge status: Home / Self Care | Payer: 59 | Source: Ambulatory Visit | Attending: Specialist | Admitting: Specialist

## 2023-05-31 DIAGNOSIS — R0602 Shortness of breath: Secondary | ICD-10-CM | POA: Diagnosis not present

## 2023-05-31 DIAGNOSIS — R079 Chest pain, unspecified: Secondary | ICD-10-CM | POA: Diagnosis not present

## 2023-05-31 DIAGNOSIS — R7989 Other specified abnormal findings of blood chemistry: Secondary | ICD-10-CM | POA: Diagnosis not present

## 2023-05-31 MED ORDER — TECHNETIUM TO 99M ALBUMIN AGGREGATED
4.0000 | Freq: Once | INTRAVENOUS | Status: AC | PRN
  Administered 2023-05-31: 4.38 via INTRAVENOUS

## 2023-05-31 NOTE — Progress Notes (Signed)
Patient informed. 

## 2023-06-12 ENCOUNTER — Other Ambulatory Visit: Payer: Self-pay | Admitting: Internal Medicine

## 2023-06-14 DIAGNOSIS — J449 Chronic obstructive pulmonary disease, unspecified: Secondary | ICD-10-CM | POA: Diagnosis not present

## 2023-06-14 DIAGNOSIS — M25561 Pain in right knee: Secondary | ICD-10-CM | POA: Diagnosis not present

## 2023-06-14 DIAGNOSIS — M25511 Pain in right shoulder: Secondary | ICD-10-CM | POA: Diagnosis not present

## 2023-06-14 DIAGNOSIS — M545 Low back pain, unspecified: Secondary | ICD-10-CM | POA: Diagnosis not present

## 2023-06-14 DIAGNOSIS — J9611 Chronic respiratory failure with hypoxia: Secondary | ICD-10-CM | POA: Diagnosis not present

## 2023-06-14 DIAGNOSIS — M7062 Trochanteric bursitis, left hip: Secondary | ICD-10-CM | POA: Diagnosis not present

## 2023-06-14 DIAGNOSIS — G8929 Other chronic pain: Secondary | ICD-10-CM | POA: Diagnosis not present

## 2023-06-14 DIAGNOSIS — M25562 Pain in left knee: Secondary | ICD-10-CM | POA: Diagnosis not present

## 2023-06-19 DIAGNOSIS — J449 Chronic obstructive pulmonary disease, unspecified: Secondary | ICD-10-CM | POA: Diagnosis not present

## 2023-06-20 ENCOUNTER — Other Ambulatory Visit: Payer: Self-pay | Admitting: Internal Medicine

## 2023-06-25 ENCOUNTER — Other Ambulatory Visit: Payer: Self-pay | Admitting: Internal Medicine

## 2023-06-26 ENCOUNTER — Emergency Department: Payer: 59

## 2023-06-26 ENCOUNTER — Other Ambulatory Visit: Payer: Self-pay

## 2023-06-26 ENCOUNTER — Emergency Department
Admission: EM | Admit: 2023-06-26 | Discharge: 2023-06-26 | Disposition: A | Discharge status: Home / Self Care | Payer: 59 | Source: Home / Self Care | Attending: Emergency Medicine | Admitting: Emergency Medicine

## 2023-06-26 DIAGNOSIS — R1011 Right upper quadrant pain: Secondary | ICD-10-CM | POA: Diagnosis not present

## 2023-06-26 DIAGNOSIS — R7989 Other specified abnormal findings of blood chemistry: Secondary | ICD-10-CM | POA: Diagnosis not present

## 2023-06-26 DIAGNOSIS — R509 Fever, unspecified: Secondary | ICD-10-CM | POA: Diagnosis not present

## 2023-06-26 DIAGNOSIS — R634 Abnormal weight loss: Secondary | ICD-10-CM | POA: Diagnosis not present

## 2023-06-26 DIAGNOSIS — I10 Essential (primary) hypertension: Secondary | ICD-10-CM | POA: Insufficient documentation

## 2023-06-26 DIAGNOSIS — R112 Nausea with vomiting, unspecified: Secondary | ICD-10-CM | POA: Diagnosis not present

## 2023-06-26 DIAGNOSIS — D72829 Elevated white blood cell count, unspecified: Secondary | ICD-10-CM | POA: Insufficient documentation

## 2023-06-26 DIAGNOSIS — K219 Gastro-esophageal reflux disease without esophagitis: Secondary | ICD-10-CM | POA: Diagnosis not present

## 2023-06-26 DIAGNOSIS — Z86010 Personal history of colonic polyps: Secondary | ICD-10-CM | POA: Diagnosis not present

## 2023-06-26 DIAGNOSIS — R1084 Generalized abdominal pain: Secondary | ICD-10-CM | POA: Insufficient documentation

## 2023-06-26 DIAGNOSIS — Z8711 Personal history of peptic ulcer disease: Secondary | ICD-10-CM | POA: Diagnosis not present

## 2023-06-26 DIAGNOSIS — K6389 Other specified diseases of intestine: Secondary | ICD-10-CM | POA: Diagnosis not present

## 2023-06-26 LAB — URINALYSIS, ROUTINE W REFLEX MICROSCOPIC
Bacteria, UA: NONE SEEN
Bilirubin Urine: NEGATIVE
Glucose, UA: NEGATIVE mg/dL
Hgb urine dipstick: NEGATIVE
Ketones, ur: NEGATIVE mg/dL
Nitrite: NEGATIVE
Protein, ur: NEGATIVE mg/dL
Specific Gravity, Urine: 1.012 (ref 1.005–1.030)
pH: 5 (ref 5.0–8.0)

## 2023-06-26 LAB — CBC
HCT: 27.6 % — ABNORMAL LOW (ref 36.0–46.0)
Hemoglobin: 8.6 g/dL — ABNORMAL LOW (ref 12.0–15.0)
MCH: 25.1 pg — ABNORMAL LOW (ref 26.0–34.0)
MCHC: 31.2 g/dL (ref 30.0–36.0)
MCV: 80.5 fL (ref 80.0–100.0)
Platelets: 601 10*3/uL — ABNORMAL HIGH (ref 150–400)
RBC: 3.43 MIL/uL — ABNORMAL LOW (ref 3.87–5.11)
RDW: 15.7 % — ABNORMAL HIGH (ref 11.5–15.5)
WBC: 21.1 10*3/uL — ABNORMAL HIGH (ref 4.0–10.5)
nRBC: 0.1 % (ref 0.0–0.2)

## 2023-06-26 LAB — COMPREHENSIVE METABOLIC PANEL
ALT: 12 U/L (ref 0–44)
AST: 13 U/L — ABNORMAL LOW (ref 15–41)
Albumin: 3 g/dL — ABNORMAL LOW (ref 3.5–5.0)
Alkaline Phosphatase: 92 U/L (ref 38–126)
Anion gap: 9 (ref 5–15)
BUN: 16 mg/dL (ref 8–23)
CO2: 24 mmol/L (ref 22–32)
Calcium: 8.6 mg/dL — ABNORMAL LOW (ref 8.9–10.3)
Chloride: 103 mmol/L (ref 98–111)
Creatinine, Ser: 0.79 mg/dL (ref 0.44–1.00)
GFR, Estimated: >60 mL/min (ref >60)
Glucose, Bld: 98 mg/dL (ref 70–99)
Potassium: 3.8 mmol/L (ref 3.5–5.1)
Sodium: 136 mmol/L (ref 135–145)
Total Bilirubin: 0.4 mg/dL (ref 0.3–1.2)
Total Protein: 6.9 g/dL (ref 6.5–8.1)

## 2023-06-26 LAB — LIPASE, BLOOD: Lipase: 27 U/L (ref 11–51)

## 2023-06-26 MED ORDER — SODIUM CHLORIDE 0.9 % IV BOLUS
1000.0000 mL | Freq: Once | INTRAVENOUS | Status: AC
  Administered 2023-06-26: 1000 mL via INTRAVENOUS

## 2023-06-26 MED ORDER — OXYCODONE-ACETAMINOPHEN 5-325 MG PO TABS
2.0000 | ORAL_TABLET | Freq: Once | ORAL | Status: AC
  Administered 2023-06-26: 2 via ORAL
  Filled 2023-06-26: qty 2

## 2023-06-26 MED ORDER — DICYCLOMINE HCL 20 MG PO TABS: 20.0000 mg | ORAL_TABLET | Freq: Four times a day (QID) | ORAL | 0 refills | Status: DC | PRN

## 2023-06-26 MED ORDER — MORPHINE SULFATE (PF) 4 MG/ML IV SOLN
4.0000 mg | Freq: Once | INTRAVENOUS | Status: AC
  Administered 2023-06-26: 4 mg via INTRAVENOUS
  Filled 2023-06-26: qty 1

## 2023-06-26 MED ORDER — ONDANSETRON HCL 4 MG/2ML IJ SOLN
4.0000 mg | Freq: Once | INTRAMUSCULAR | Status: AC
  Administered 2023-06-26: 4 mg via INTRAVENOUS
  Filled 2023-06-26: qty 2

## 2023-06-26 MED ORDER — OMEPRAZOLE MAGNESIUM 20 MG PO TBEC: 20.0000 mg | DELAYED_RELEASE_TABLET | Freq: Every day | ORAL | 0 refills | Status: DC

## 2023-06-26 NOTE — ED Provider Notes (Signed)
[facility] Provider Note    Event Date/Time   First MD Initiated Contact with Patient 06/26/23 1733     (approximate)   History   Abdominal Pain   HPI  Jean Gomez is a 67 y.o. female with history of chronic pain, HTN, IBS presenting to the emergency department for evaluation of abdominal pain.  Patient reports that she has had generalized abdominal pain over the past several months.  She also notes an area of swelling just underneath her skin over her right upper abdomen.  Does report some intermittent nausea, vomiting, diarrhea, worse since Saturday.  She has been taking Pepto-Bismol with limited benefit.  Does report she is tolerating some p.o. intake.  On review of her chart, it appears that she was seen in the GI office prior to presentation here.  Documentation available reports that patient wanted to be seen in the ER for more timely evaluation of her progressive abdominal pain.      Physical Exam   Triage Vital Signs: ED Triage Vitals  Enc Vitals Group     BP 06/26/23 1532 98/62     Pulse Rate 06/26/23 1532 (!) 111     Resp 06/26/23 1532 20     Temp 06/26/23 1532 99.4 F (37.4 C)     Temp Source 06/26/23 1532 Oral     SpO2 06/26/23 1532 100 %     Weight 06/26/23 1533 176 lb (79.8 kg)     Height 06/26/23 1533 5\' 3"  (1.6 m)     Head Circumference --      Peak Flow --      Pain Score 06/26/23 1530 10     Pain Loc --      Pain Edu? --      Excl. in GC? --     Most recent vital signs: Vitals:   06/26/23 1900 06/26/23 1930  BP: 126/76 (!) 117/56  Pulse: 93 97  Resp:    Temp:    SpO2: 100% 100%     General: Awake, interactive  CV:  Tachycardic with regular rhythm Resp:  Lungs clear, unlabored respirations.  Abd:  Soft, nondistended, mild generalized tenderness to palpation without rebound or guarding, there is a area of soft tissue swelling along the lower rib cage on the right side of the abdomen without associated fluctuance,  erythema, warmth, mild tenderness to palpation Neuro:  Symmetric facial movement, fluid speech   ED Results / Procedures / Treatments   Labs (all labs ordered are listed, but only abnormal results are displayed) Labs Reviewed  COMPREHENSIVE METABOLIC PANEL - Abnormal; Notable for the following components:      Result Value   Calcium 8.6 (*)    Albumin 3.0 (*)    AST 13 (*)    All other components within normal limits  CBC - Abnormal; Notable for the following components:   WBC 21.1 (*)    RBC 3.43 (*)    Hemoglobin 8.6 (*)    HCT 27.6 (*)    MCH 25.1 (*)    RDW 15.7 (*)    Platelets 601 (*)    All other components within normal limits  URINALYSIS, ROUTINE W REFLEX MICROSCOPIC - Abnormal; Notable for the following components:   Color, Urine YELLOW (*)    APPearance CLEAR (*)    Leukocytes,Ua TRACE (*)    All other components within normal limits  LIPASE, BLOOD     EKG EKG independently reviewed interpreted by myself (ER  attending) demonstrates:    RADIOLOGY Imaging independently reviewed and interpreted by myself demonstrates:  CT abdomen pelvis without evidence of bowel obstruction or other acute intra-abdominal process.   PROCEDURES:  Critical Care performed: No  Procedures   MEDICATIONS ORDERED IN ED: Medications  oxyCODONE-acetaminophen (PERCOCET/ROXICET) 5-325 MG per tablet 2 tablet (has no administration in time range)  sodium chloride 0.9 % bolus 1,000 mL (1,000 mLs Intravenous New Bag/Given 06/26/23 1829)  ondansetron (ZOFRAN) injection 4 mg (4 mg Intravenous Given 06/26/23 1829)  morphine (PF) 4 MG/ML injection 4 mg (4 mg Intravenous Given 06/26/23 1829)     IMPRESSION / MDM / ASSESSMENT AND PLAN / ED COURSE  I reviewed the triage vital signs and the nursing notes.  Differential diagnosis includes, but is not limited to, colitis, diverticulitis, IBS flare, appendicitis, other intra-abdominal process  Patient's presentation is most consistent with acute  presentation with potential threat to life or bodily function.  67 year old female presenting to the emergency department for evaluation of progressive abdominal pain.  Some tenderness on exam without evidence of peritonitis.  Does have a contrast allergy that patient reports involves both skin changes and difficulty breathing, swelling documented in computer.  Discussed limitations of noncontrast CT with patient, but given the duration of her symptoms and overall reassuring abdominal exam, do think this is more appropriate with her allergy.  Will treat symptomatically with IV fluids, Zofran, morphine.  Lab work is notable for leukocytosis with WBC of 21, but this is actually improved from a month ago when she was up to 25.  She does have anemia, slightly decreased from prior, but no reported acute bleeding sources.  CMP without severe derangement.  UA slightly reflective of infection with a clean sample, but patient without urinary symptoms.  Will send urine culture, but hold off on empiric antibiotics.  CT abdomen pelvis demonstrates gastric wall thickening, no other acute intra-abdominal process noted.  Patient updated on the results of her workup.  She does report some improvement in her pain.  She denies taking NSAIDs, reports she is been taking Tylenol and her narcotic pain medicine.  She does intermittently take omeprazole.  I did recommend that she start taking this regularly.  She will arrange follow-up with GI for further evaluation.  She is comfortable with discharge home.  Will DC with prescriptions for omeprazole as well as Bentyl to see if that improves her abdominal pain as well.  Strict return precautions were provided.  Patient discharged stable condition.      FINAL CLINICAL IMPRESSION(S) / ED DIAGNOSES   Final diagnoses:  Generalized abdominal pain     Rx / DC Orders   ED Discharge Orders          Ordered    dicyclomine (BENTYL) 20 MG tablet  Every 6 hours PRN         06/26/23 2058    omeprazole (PRILOSEC OTC) 20 MG tablet  Daily        06/26/23 2058             Note:  This document was prepared using Dragon voice recognition software and may include unintentional dictation errors.   Trinna Post, MD 06/26/23 [phone]

## 2023-06-26 NOTE — Discharge Instructions (Signed)
You were seen in the emergency room today for your abdominal pain has been going on for several months.  We did not find an emergency cause for this.  Your CT was concerning for inflammation of your stomach lining called gastritis.  Please take omeprazole daily until you see GI for further evaluation.  I also sent a prescription for an antispasm medicine called Bentyl that you can take as needed.  Return to the ER for new or worsening symptoms.

## 2023-06-26 NOTE — ED Triage Notes (Signed)
Pt to ED for generalized abdominal pain since 2-3 months "like pins sticking me" and also soft area of swelling to R abdomen. LBM today. Pt also has NVD since Saturday (twice per day for each). Pt also complains of chronic back and knee pain. Pt wears 3L at baseline. Seen at [facility] today, referred to ED. Temp 99.4, was 99.1 at UC.  Pt declines bloodwork in triage, asking to call lab because she is a hard stick. Pt appears in NAD.

## 2023-06-27 DIAGNOSIS — J449 Chronic obstructive pulmonary disease, unspecified: Secondary | ICD-10-CM | POA: Diagnosis not present

## 2023-06-29 ENCOUNTER — Encounter: Payer: Self-pay | Admitting: *Deleted

## 2023-07-02 ENCOUNTER — Ambulatory Visit
Admission: RE | Admit: 2023-07-02 | Discharge: 2023-07-02 | Disposition: A | Discharge status: Home / Self Care | Payer: 59 | Attending: Gastroenterology | Admitting: Gastroenterology

## 2023-07-02 ENCOUNTER — Ambulatory Visit: Payer: 59 | Admitting: General Practice

## 2023-07-02 ENCOUNTER — Encounter
Admission: RE | Disposition: A | Discharge status: Home / Self Care | Payer: Self-pay | Source: Home / Self Care | Attending: Gastroenterology

## 2023-07-02 ENCOUNTER — Other Ambulatory Visit: Payer: Self-pay

## 2023-07-02 ENCOUNTER — Encounter: Payer: Self-pay | Admitting: *Deleted

## 2023-07-02 DIAGNOSIS — K259 Gastric ulcer, unspecified as acute or chronic, without hemorrhage or perforation: Secondary | ICD-10-CM | POA: Insufficient documentation

## 2023-07-02 DIAGNOSIS — Z79899 Other long term (current) drug therapy: Secondary | ICD-10-CM | POA: Diagnosis not present

## 2023-07-02 DIAGNOSIS — K2950 Unspecified chronic gastritis without bleeding: Secondary | ICD-10-CM | POA: Insufficient documentation

## 2023-07-02 DIAGNOSIS — Z8711 Personal history of peptic ulcer disease: Secondary | ICD-10-CM | POA: Insufficient documentation

## 2023-07-02 DIAGNOSIS — Z9981 Dependence on supplemental oxygen: Secondary | ICD-10-CM | POA: Insufficient documentation

## 2023-07-02 DIAGNOSIS — Z09 Encounter for follow-up examination after completed treatment for conditions other than malignant neoplasm: Secondary | ICD-10-CM | POA: Diagnosis not present

## 2023-07-02 DIAGNOSIS — J4489 Other specified chronic obstructive pulmonary disease: Secondary | ICD-10-CM | POA: Insufficient documentation

## 2023-07-02 DIAGNOSIS — D869 Sarcoidosis, unspecified: Secondary | ICD-10-CM | POA: Diagnosis not present

## 2023-07-02 DIAGNOSIS — I10 Essential (primary) hypertension: Secondary | ICD-10-CM | POA: Diagnosis not present

## 2023-07-02 HISTORY — PX: ESOPHAGOGASTRODUODENOSCOPY (EGD) WITH PROPOFOL: SHX5813

## 2023-07-02 HISTORY — PX: BIOPSY: SHX5522

## 2023-07-02 SURGERY — ESOPHAGOGASTRODUODENOSCOPY (EGD) WITH PROPOFOL
Anesthesia: General

## 2023-07-02 MED ORDER — PROPOFOL 1000 MG/100ML IV EMUL
INTRAVENOUS | Status: AC
  Filled 2023-07-02: qty 100

## 2023-07-02 MED ORDER — LIDOCAINE HCL (PF) 2 % IJ SOLN
INTRAMUSCULAR | Status: AC
  Filled 2023-07-02: qty 5

## 2023-07-02 MED ORDER — LIDOCAINE HCL (PF) 1 % IJ SOLN
INTRAMUSCULAR | Status: AC
  Filled 2023-07-02: qty 2

## 2023-07-02 MED ORDER — GLYCOPYRROLATE 0.2 MG/ML IJ SOLN
INTRAMUSCULAR | Status: AC
  Filled 2023-07-02: qty 1

## 2023-07-02 MED ORDER — DEXMEDETOMIDINE HCL IN NACL 80 MCG/20ML IV SOLN
INTRAVENOUS | Status: DC | PRN
  Administered 2023-07-02: 4 ug via INTRAVENOUS

## 2023-07-02 MED ORDER — SODIUM CHLORIDE 0.9 % IV SOLN: INTRAVENOUS | Status: DC

## 2023-07-02 MED ORDER — LIDOCAINE HCL (CARDIAC) PF 100 MG/5ML IV SOSY
PREFILLED_SYRINGE | INTRAVENOUS | Status: DC | PRN
  Administered 2023-07-02: 80 mg via INTRAVENOUS

## 2023-07-02 MED ORDER — PROPOFOL 10 MG/ML IV BOLUS
INTRAVENOUS | Status: DC | PRN
  Administered 2023-07-02: 40 mg via INTRAVENOUS
  Administered 2023-07-02: 50 mg via INTRAVENOUS
  Administered 2023-07-02: 30 mg via INTRAVENOUS
  Administered 2023-07-02: 80 mg via INTRAVENOUS

## 2023-07-02 NOTE — Anesthesia Postprocedure Evaluation (Signed)
Anesthesia Post Note  Patient: Jean Gomez  Procedure(s) Performed: ESOPHAGOGASTRODUODENOSCOPY (EGD) WITH PROPOFOL BIOPSY  Patient location during evaluation: Endoscopy Anesthesia Type: General Level of consciousness: awake and alert Pain management: pain level controlled Vital Signs Assessment: post-procedure vital signs reviewed and stable Respiratory status: spontaneous breathing, nonlabored ventilation, respiratory function stable and patient connected to nasal cannula oxygen Cardiovascular status: blood pressure returned to baseline and stable Postop Assessment: no apparent nausea or vomiting Anesthetic complications: no  No notable events documented.   Last Vitals:  Vitals:   07/02/23 1245 07/02/23 1253  BP: (!) 132/92 124/85  Pulse:    Resp:    Temp:    SpO2:      Last Pain:  Vitals:   07/02/23 1253  TempSrc:   PainSc: 0-No pain                 Stephanie Coup

## 2023-07-02 NOTE — Anesthesia Preprocedure Evaluation (Signed)
Anesthesia Evaluation  Patient identified by MRN, date of birth, ID band Patient awake    Reviewed: Allergy & Precautions, H&P , NPO status , Patient's Chart, lab work & pertinent test results  History of Anesthesia Complications Negative for: history of anesthetic complications  Airway Mallampati: II  TM Distance: >3 FB     Dental  (+) Upper Dentures, Lower Dentures, Dental Advidsory Given   Pulmonary shortness of breath, at rest and Long-Term Oxygen Therapy, asthma , sleep apnea, Continuous Positive Airway Pressure Ventilation and Oxygen sleep apnea , COPD,  oxygen dependent Pulmonary sarcoidosis and COPD Wears 4L [place] while awake and CPAP while sleeping   breath sounds clear to auscultation       Cardiovascular hypertension, (-) angina (-) Past MI and (-) Cardiac Stents (-) dysrhythmias  Rhythm:regular Rate:Normal     Neuro/Psych  Headaches, Seizures -,  PSYCHIATRIC DISORDERS Anxiety Depression Bipolar Disorder      GI/Hepatic Neg liver ROS, hiatal hernia,GERD  ,,  Endo/Other  negative endocrine ROS    Renal/GU negative Renal ROS  negative genitourinary   Musculoskeletal   Abdominal   Peds  Hematology negative hematology ROS (+)   Anesthesia Other Findings Past Medical History: 01/16/2019: Acute postoperative pain No date: Allergy No date: Asthma No date: Cerebral aneurysm 11/01/2016: Chest pain No date: Chronic migraine     Comment:  Migraine No date: Chronic pain syndrome No date: Chronic respiratory failure (HCC) No date: Constipation No date: COPD (chronic obstructive pulmonary disease) (HCC) No date: Depression     Comment:  major depression in remission No date: Hypercholesterolemia No date: Hypertension No date: Osteoarthritis No date: Pneumonia     Comment:  12/2018 No date: Sarcoid No date: Seizures (HCC) No date: Sleep apnea  Past Surgical History: No date: ABDOMINAL HYSTERECTOMY No date:  BACK SURGERY     Comment:  spine fusion anterior lumbar with open posterior No date: CHOLECYSTECTOMY 08/19/2019: COLONOSCOPY WITH PROPOFOL; N/A     Comment:  Procedure: COLONOSCOPY WITH PROPOFOL;  Surgeon:               Christena Deem, MD;  Location: [facility] ENDOSCOPY;                Service: Endoscopy;  Laterality: N/A; 08/19/2019: ESOPHAGOGASTRODUODENOSCOPY (EGD) WITH PROPOFOL; N/A     Comment:  Procedure: ESOPHAGOGASTRODUODENOSCOPY (EGD) WITH               PROPOFOL;  Surgeon: Christena Deem, MD;  Location:               [facility] ENDOSCOPY;  Service: Endoscopy;  Laterality: N/A; 08/04/2020: ESOPHAGOGASTRODUODENOSCOPY (EGD) WITH PROPOFOL; N/A     Comment:  Procedure: ESOPHAGOGASTRODUODENOSCOPY (EGD) WITH               PROPOFOL;  Surgeon: Earline Mayotte, MD;  Location:               [facility] ENDOSCOPY;  Service: Endoscopy;  Laterality: N/A; No date: SPINAL FUSION     Reproductive/Obstetrics negative OB ROS                             Anesthesia Physical Anesthesia Plan  ASA: 3  Anesthesia Plan: General   Post-op Pain Management: Minimal or no pain anticipated   Induction: Intravenous  PONV Risk Score and Plan: 3 and Propofol infusion and TIVA  Airway Management Planned: Simple Face Mask  Additional Equipment: None  Intra-op Plan:   Post-operative Plan:   Informed Consent: I have reviewed the patients History and Physical, chart, labs and discussed the procedure including the risks, benefits and alternatives for the proposed anesthesia with the patient or authorized representative who has indicated his/her understanding and acceptance.     Dental Advisory Given  Plan Discussed with: Anesthesiologist, CRNA and Surgeon  Anesthesia Plan Comments: (Discussed risks of anesthesia with patient, including possibility of difficulty with spontaneous ventilation under anesthesia necessitating airway intervention, PONV, and rare risks such as cardiac or  respiratory or neurological events, and allergic reactions. Discussed the role of CRNA in patient's perioperative care. Patient understands.)        Anesthesia Quick Evaluation

## 2023-07-02 NOTE — Transfer of Care (Signed)
Immediate Anesthesia Transfer of Care Note  Patient: Jean Gomez  Procedure(s) Performed: ESOPHAGOGASTRODUODENOSCOPY (EGD) WITH PROPOFOL BIOPSY  Patient Location: PACU  Anesthesia Type:General  Level of Consciousness: drowsy  Airway & Oxygen Therapy: Patient Spontanous Breathing and Patient connected to nasal cannula oxygen  Post-op Assessment: Report given to RN and Post -op Vital signs reviewed and stable  Post vital signs: Reviewed and stable  Last Vitals:  Vitals Value Taken Time  BP 127/87 07/02/23 1236  Temp 97.5   Pulse 100 07/02/23 1236  Resp 18 07/02/23 1236  SpO2 98 % 07/02/23 1236  Vitals shown include unfiled device data.  Last Pain:  Vitals:   07/02/23 1118  TempSrc: Temporal  PainSc: 0-No pain         Complications: No notable events documented.

## 2023-07-02 NOTE — Op Note (Signed)
[facility] Gastroenterology Patient Name: Jean Gomez Procedure Date: 07/02/2023 11:23 AM MRN: [id number] Account #: [id number] Date of Birth: 1956-07-28 Admit Type: Outpatient Age: 67 Room: [facility] ENDO ROOM 3 Gender: Female Note Status: Finalized Instrument Name: Upper Endoscope [id number] Procedure:             Upper GI endoscopy Indications:           Generalized abdominal pain Providers:             Eather Colas MD, MD Medicines:             Monitored Anesthesia Care Complications:         No immediate complications. Estimated blood loss:                         Minimal. Procedure:             Pre-Anesthesia Assessment:                        - Prior to the procedure, a History and Physical was                         performed, and patient medications and allergies were                         reviewed. The patient is competent. The risks and                         benefits of the procedure and the sedation options and                         risks were discussed with the patient. All questions                         were answered and informed consent was obtained.                         Patient identification and proposed procedure were                         verified by the physician, the nurse, the                         anesthesiologist, the anesthetist and the technician                         in the endoscopy suite. Mental Status Examination:                         alert and oriented. Airway Examination: normal                         oropharyngeal airway and neck mobility. Respiratory                         Examination: clear to auscultation. CV Examination:                         normal. Prophylactic Antibiotics: The patient does not  require prophylactic antibiotics. Prior                         Anticoagulants: The patient has taken no anticoagulant                         or antiplatelet agents. ASA Grade  Assessment: III - A                         patient with severe systemic disease. After reviewing                         the risks and benefits, the patient was deemed in                         satisfactory condition to undergo the procedure. The                         anesthesia plan was to use monitored anesthesia care                         (MAC). Immediately prior to administration of                         medications, the patient was re-assessed for adequacy                         to receive sedatives. The heart rate, respiratory                         rate, oxygen saturations, blood pressure, adequacy of                         pulmonary ventilation, and response to care were                         monitored throughout the procedure. The physical                         status of the patient was re-assessed after the                         procedure.                        After obtaining informed consent, the endoscope was                         passed under direct vision. Throughout the procedure,                         the patient's blood pressure, pulse, and oxygen                         saturations were monitored continuously. The Endoscope                         was introduced through the mouth, and advanced to the  second part of duodenum. The upper GI endoscopy was                         accomplished without difficulty. The patient tolerated                         the procedure well. Findings:      The examined esophagus was normal.      Two non-bleeding cratered gastric ulcers with a clean ulcer base       (Forrest Class III) were found in the gastric antrum, on the greater       curvature of the gastric antrum and at the pylorus. The largest lesion       was 15 mm in largest dimension. Biopsies were taken with a cold forceps       for Helicobacter pylori testing. Estimated blood loss was minimal.      The examined duodenum was  normal. Impression:            - Normal esophagus.                        - Non-bleeding gastric ulcers with a clean ulcer base                         (Forrest Class III). Biopsied.                        - Normal examined duodenum. Recommendation:        - Discharge patient to home.                        - Resume previous diet.                        - Continue present medications.                        - Use a proton pump inhibitor PO BID.                        - Await pathology results.                        - Repeat upper endoscopy in 3 months to check healing.                        - Return to referring physician as previously                         scheduled. Procedure Code(s):     --- Professional ---                        [phone], Esophagogastroduodenoscopy, flexible,                         transoral; with biopsy, single or multiple Diagnosis Code(s):     --- Professional ---                        K25.9, Gastric ulcer, unspecified as acute or chronic,  without hemorrhage or perforation                        R10.84, Generalized abdominal pain CPT copyright 2022 American [employer]. All rights reserved. The codes documented in this report are preliminary and upon coder review may  be revised to meet current compliance requirements. Eather Colas MD, MD 07/02/2023 12:34:24 PM Number of Addenda: 0 Note Initiated On: 07/02/2023 11:23 AM Estimated Blood Loss:  Estimated blood loss was minimal.      [facility]

## 2023-07-02 NOTE — H&P (Signed)
Outpatient short stay form Pre-procedure 07/02/2023  Regis Bill, MD  Primary Physician: Sherron Monday, MD  Reason for visit:  Abdominal pain  History of present illness:    67 y/o lady with history of chronic pain, COPD on supplemental o2, and sarcoidosis here for EGD for chronic abdominal pain. No NSAIDS but history of PUD. No blood thinners. Compliant with PPI. No family history of GI malignancies.    Current Facility-Administered Medications:    0.9 %  sodium chloride infusion, , Intravenous, Continuous, Rinnah Peppel, Rossie Muskrat, MD, Last Rate: 20 mL/hr at 07/02/23 1140, New Bag at 07/02/23 1140  Medications Prior to Admission  Medication Sig Dispense Refill Last Dose   albuterol (PROVENTIL) (2.5 MG/3ML) 0.083% nebulizer solution Take 2.5 mg by nebulization every 6 (six) hours as needed for wheezing or shortness of breath.   07/01/2023   Albuterol Sulfate 108 (90 Base) MCG/ACT AEPB Inhale 2 puffs into the lungs every 6 (six) hours as needed.   07/01/2023   AMITIZA 24 MCG capsule Take 24 mcg by mouth 2 (two) times daily.   07/01/2023   amLODipine-olmesartan (AZOR) 5-40 MG tablet TAKE 1 TABLET BY MOUTH EVERY MORNING 90 tablet 1 07/02/2023 at 0600   ARIPiprazole (ABILIFY) 5 MG tablet Take 5 mg by mouth at bedtime.   07/01/2023   aspirin EC 81 MG tablet Take 1 tablet (81 mg total) by mouth daily. 30 tablet 0 07/01/2023   atorvastatin (LIPITOR) 80 MG tablet TAKE 1 TABLET BY MOUTH NIGHTLY 90 tablet 0 07/01/2023   betamethasone, augmented, (DIPROLENE) 0.05 % lotion Apply topically 2 (two) times daily.   07/01/2023   busPIRone (BUSPAR) 15 MG tablet Take 15 mg by mouth 2 (two) times daily.   07/01/2023   calcium carbonate (OS-CAL) 1250 (500 Ca) MG chewable tablet Chew by mouth daily.   07/01/2023   citalopram (CELEXA) 40 MG tablet TAKE ONE TABLET BY MOUTH EVERY MORNING 30 tablet 1 07/01/2023   clotrimazole-betamethasone (LOTRISONE) cream Apply 1 application topically 2 (two) times daily. 30 g 0  07/01/2023   dicyclomine (BENTYL) 20 MG tablet Take 1 tablet (20 mg total) by mouth every 6 (six) hours as needed for up to 7 days for spasms. 25 tablet 0 07/01/2023   diphenhydrAMINE (BENADRYL) 25 MG tablet Take 25 mg by mouth every 6 (six) hours as needed.   07/01/2023   donepezil (ARICEPT) 10 MG tablet TAKE ONE TABLET BY MOUTH EVERY MORNING 90 tablet 0 07/01/2023   Elastic Bandages & Supports (MEDICAL COMPRESSION STOCKINGS) MISC Please provide compression stockings 1 each 0 07/01/2023   Fluticasone Furoate (ARNUITY ELLIPTA) 100 MCG/ACT AEPB INHALE 1 INHALATION INTO THE LUNGS ONCE DAILY. 30 each 1 07/01/2023   hydrochlorothiazide (HYDRODIURIL) 25 MG tablet TAKE 1 TABLET BY MOUTH EVERY MORNING 90 tablet 1 07/02/2023 at 0600   levETIRAcetam (KEPPRA) 750 MG tablet TAKE ONE TABLET BY MOUTH EVERY MORNING 90 tablet 1 07/01/2023   linaclotide (LINZESS) 290 MCG CAPS capsule TAKE ONE TABLET BY MOUTH EVERY MORNING 30 capsule 2 07/01/2023   nitroGLYCERIN (NITROSTAT) 0.4 MG SL tablet Place 0.4 mg under the tongue every 5 (five) minutes as needed for chest pain.   Past Month   omeprazole (PRILOSEC OTC) 20 MG tablet Take 1 tablet (20 mg total) by mouth daily for 28 days. 28 tablet 0 07/01/2023   pantoprazole (PROTONIX) 40 MG tablet TAKE ONE TABLET BY MOUTH EVERY MORNING 90 tablet 0 07/01/2023   potassium chloride SA (KLOR-CON M) 20 MEQ tablet  TAKE ONE TABLET BY MOUTH EVERY MORNING 90 tablet 1 07/01/2023   QUEtiapine (SEROQUEL) 400 MG tablet Take 400 mg by mouth at bedtime.   07/01/2023   sucralfate (CARAFATE) 1 g tablet TAKE ONE TABLET BY MOUTH FOUR TIMES DAILY BEFORE MEALS AND AT BEDTIME 360 tablet 0 07/01/2023   Suvorexant (BELSOMRA) 20 MG TABS Take 1 tablet (20 mg total) by mouth at bedtime. 30 tablet 2 07/01/2023   torsemide (DEMADEX) 20 MG tablet Take 1 tablet (20 mg total) by mouth daily. 5 tablet 0 07/02/2023 at 0600   traZODone (DESYREL) 100 MG tablet TAKE 1 TABLET BY MOUTH NIGHTLY 30 tablet 0 07/01/2023    venlafaxine (EFFEXOR) 75 MG tablet TK 1 T PO BID  3 07/01/2023   promethazine (PHENERGAN) 25 MG tablet Take 1 tablet (25 mg total) by mouth every 6 (six) hours as needed for nausea or vomiting. 30 tablet 2    tiZANidine (ZANAFLEX) 4 MG tablet Take 1 tablet (4 mg total) by mouth 3 (three) times daily. 90 tablet 5      Allergies  Allergen Reactions   Contrast Media [Iodinated Contrast Media] Swelling   Gabapentin    Labetalol Other (See Comments)    Made hair fall out   Sulfabenzamide Nausea Only   Penicillins Rash   Pregabalin Other (See Comments)    The patient complained of hemifacial numbness with the use of the medication.     Past Medical History:  Diagnosis Date   Acute postoperative pain 01/16/2019   Allergy    Asthma    Cerebral aneurysm    Chest pain 11/01/2016   Chronic migraine    Migraine   Chronic pain syndrome    Chronic respiratory failure (HCC)    Constipation    COPD (chronic obstructive pulmonary disease) (HCC)    Depression    major depression in remission   Hypercholesterolemia    Hypertension    Osteoarthritis    Pneumonia    12/2018   Sarcoid    Seizures (HCC)    Sleep apnea     Review of systems:  Otherwise negative.    Physical Exam  Gen: Alert, oriented. Appears stated age.  HEENT: PERRLA. Lungs: No respiratory distress CV: RRR Abd: soft, benign, no masses Ext: No edema    Planned procedures: Proceed with EGD. The patient understands the nature of the planned procedure, indications, risks, alternatives and potential complications including but not limited to bleeding, infection, perforation, damage to internal organs and possible oversedation/side effects from anesthesia. The patient agrees and gives consent to proceed.  Please refer to procedure notes for findings, recommendations and patient disposition/instructions.     Regis Bill, MD [facility] Gastroenterology

## 2023-07-02 NOTE — Interval H&P Note (Signed)
History and Physical Interval Note:  07/02/2023 12:07 PM  Jean Gomez  has presented today for surgery, with the diagnosis of R10.84 (ICD-10-CM) - Generalized abdominal pain R93.3 (ICD-10-CM) - Abnormal CT scan, gastrointestinal tract.  The various methods of treatment have been discussed with the patient and family. After consideration of risks, benefits and other options for treatment, the patient has consented to  Procedure(s): ESOPHAGOGASTRODUODENOSCOPY (EGD) WITH PROPOFOL (N/A) as a surgical intervention.  The patient's history has been reviewed, patient examined, no change in status, stable for surgery.  I have reviewed the patient's chart and labs.  Questions were answered to the patient's satisfaction.     Regis Bill  Ok to proceed with EGD

## 2023-07-03 ENCOUNTER — Telehealth: Payer: Self-pay

## 2023-07-03 ENCOUNTER — Encounter: Payer: Self-pay | Admitting: Gastroenterology

## 2023-07-03 DIAGNOSIS — H26492 Other secondary cataract, left eye: Secondary | ICD-10-CM | POA: Diagnosis not present

## 2023-07-03 DIAGNOSIS — Z961 Presence of intraocular lens: Secondary | ICD-10-CM | POA: Diagnosis not present

## 2023-07-03 DIAGNOSIS — H02401 Unspecified ptosis of right eyelid: Secondary | ICD-10-CM | POA: Diagnosis not present

## 2023-07-03 NOTE — Telephone Encounter (Signed)
Transition Care Management Follow-up Telephone Call Date of discharge and from where: [place] 7/9 How have you been since you were released from the hospital? Not Good and very weak. Patient can't be seen at PCP until 8/7 Any questions or concerns? No  Items Reviewed: Did the pt receive and understand the discharge instructions provided? Yes  Medications obtained and verified? Yes  Other? No  Any new allergies since your discharge? No  Dietary orders reviewed? No Do you have support at home? Yes     Follow up appointments reviewed:  PCP Hospital f/u appt confirmed? Yes  Scheduled to see  PCP on 8/7 @ 10:00. Specialist Hospital f/u appt confirmed? No Scheduled to see  on  @ . Are transportation arrangements needed? No  If their condition worsens, is the pt aware to call PCP or go to the Emergency Dept.? Yes Was the patient provided with contact information for the PCP's office or ED? Yes Was to pt encouraged to call back with questions or concerns? Yes

## 2023-07-09 ENCOUNTER — Other Ambulatory Visit: Payer: Self-pay | Admitting: Internal Medicine

## 2023-07-09 DIAGNOSIS — F5101 Primary insomnia: Secondary | ICD-10-CM

## 2023-07-12 DIAGNOSIS — G8929 Other chronic pain: Secondary | ICD-10-CM | POA: Diagnosis not present

## 2023-07-12 DIAGNOSIS — M545 Low back pain, unspecified: Secondary | ICD-10-CM | POA: Diagnosis not present

## 2023-07-12 DIAGNOSIS — M25511 Pain in right shoulder: Secondary | ICD-10-CM | POA: Diagnosis not present

## 2023-07-12 DIAGNOSIS — J449 Chronic obstructive pulmonary disease, unspecified: Secondary | ICD-10-CM | POA: Diagnosis not present

## 2023-07-12 DIAGNOSIS — M7062 Trochanteric bursitis, left hip: Secondary | ICD-10-CM | POA: Diagnosis not present

## 2023-07-12 DIAGNOSIS — M25561 Pain in right knee: Secondary | ICD-10-CM | POA: Diagnosis not present

## 2023-07-12 DIAGNOSIS — J9611 Chronic respiratory failure with hypoxia: Secondary | ICD-10-CM | POA: Diagnosis not present

## 2023-07-12 DIAGNOSIS — M25562 Pain in left knee: Secondary | ICD-10-CM | POA: Diagnosis not present

## 2023-07-20 DIAGNOSIS — J449 Chronic obstructive pulmonary disease, unspecified: Secondary | ICD-10-CM | POA: Diagnosis not present

## 2023-07-25 ENCOUNTER — Ambulatory Visit: Payer: 59 | Admitting: Internal Medicine

## 2023-07-28 DIAGNOSIS — J449 Chronic obstructive pulmonary disease, unspecified: Secondary | ICD-10-CM | POA: Diagnosis not present

## 2023-07-30 ENCOUNTER — Other Ambulatory Visit: Payer: Self-pay | Admitting: Internal Medicine

## 2023-07-30 DIAGNOSIS — H26492 Other secondary cataract, left eye: Secondary | ICD-10-CM | POA: Diagnosis not present

## 2023-08-01 DIAGNOSIS — G894 Chronic pain syndrome: Secondary | ICD-10-CM | POA: Diagnosis not present

## 2023-08-01 DIAGNOSIS — J449 Chronic obstructive pulmonary disease, unspecified: Secondary | ICD-10-CM | POA: Diagnosis not present

## 2023-08-01 DIAGNOSIS — G4733 Obstructive sleep apnea (adult) (pediatric): Secondary | ICD-10-CM | POA: Diagnosis not present

## 2023-08-01 DIAGNOSIS — Z9981 Dependence on supplemental oxygen: Secondary | ICD-10-CM | POA: Diagnosis not present

## 2023-08-06 ENCOUNTER — Other Ambulatory Visit: Payer: Self-pay | Admitting: Internal Medicine

## 2023-08-06 DIAGNOSIS — F5101 Primary insomnia: Secondary | ICD-10-CM

## 2023-08-16 DIAGNOSIS — M25511 Pain in right shoulder: Secondary | ICD-10-CM | POA: Diagnosis not present

## 2023-08-16 DIAGNOSIS — M25562 Pain in left knee: Secondary | ICD-10-CM | POA: Diagnosis not present

## 2023-08-16 DIAGNOSIS — G4733 Obstructive sleep apnea (adult) (pediatric): Secondary | ICD-10-CM | POA: Diagnosis not present

## 2023-08-16 DIAGNOSIS — M25561 Pain in right knee: Secondary | ICD-10-CM | POA: Diagnosis not present

## 2023-08-16 DIAGNOSIS — M7062 Trochanteric bursitis, left hip: Secondary | ICD-10-CM | POA: Diagnosis not present

## 2023-08-16 DIAGNOSIS — M545 Low back pain, unspecified: Secondary | ICD-10-CM | POA: Diagnosis not present

## 2023-08-16 DIAGNOSIS — G8929 Other chronic pain: Secondary | ICD-10-CM | POA: Diagnosis not present

## 2023-08-16 DIAGNOSIS — J449 Chronic obstructive pulmonary disease, unspecified: Secondary | ICD-10-CM | POA: Diagnosis not present

## 2023-08-16 DIAGNOSIS — J9611 Chronic respiratory failure with hypoxia: Secondary | ICD-10-CM | POA: Diagnosis not present

## 2023-08-20 DIAGNOSIS — J449 Chronic obstructive pulmonary disease, unspecified: Secondary | ICD-10-CM | POA: Diagnosis not present

## 2023-08-28 DIAGNOSIS — J449 Chronic obstructive pulmonary disease, unspecified: Secondary | ICD-10-CM | POA: Diagnosis not present

## 2023-08-30 ENCOUNTER — Emergency Department: Payer: 59

## 2023-08-30 ENCOUNTER — Other Ambulatory Visit: Payer: Self-pay

## 2023-08-30 ENCOUNTER — Inpatient Hospital Stay
Admission: EM | Admit: 2023-08-30 | Discharge: 2023-09-06 | DRG: 872 | Disposition: A | Discharge status: Home / Self Care | Payer: 59 | Attending: Internal Medicine | Admitting: Internal Medicine

## 2023-08-30 DIAGNOSIS — A419 Sepsis, unspecified organism: Secondary | ICD-10-CM | POA: Diagnosis not present

## 2023-08-30 DIAGNOSIS — K529 Noninfective gastroenteritis and colitis, unspecified: Secondary | ICD-10-CM | POA: Diagnosis present

## 2023-08-30 DIAGNOSIS — D869 Sarcoidosis, unspecified: Secondary | ICD-10-CM | POA: Diagnosis not present

## 2023-08-30 DIAGNOSIS — Z7982 Long term (current) use of aspirin: Secondary | ICD-10-CM

## 2023-08-30 DIAGNOSIS — N179 Acute kidney failure, unspecified: Secondary | ICD-10-CM | POA: Diagnosis not present

## 2023-08-30 DIAGNOSIS — R197 Diarrhea, unspecified: Secondary | ICD-10-CM | POA: Diagnosis present

## 2023-08-30 DIAGNOSIS — J9611 Chronic respiratory failure with hypoxia: Secondary | ICD-10-CM | POA: Diagnosis present

## 2023-08-30 DIAGNOSIS — Z880 Allergy status to penicillin: Secondary | ICD-10-CM | POA: Diagnosis not present

## 2023-08-30 DIAGNOSIS — R6520 Severe sepsis without septic shock: Secondary | ICD-10-CM | POA: Diagnosis present

## 2023-08-30 DIAGNOSIS — G43909 Migraine, unspecified, not intractable, without status migrainosus: Secondary | ICD-10-CM | POA: Diagnosis present

## 2023-08-30 DIAGNOSIS — I10 Essential (primary) hypertension: Secondary | ICD-10-CM | POA: Diagnosis not present

## 2023-08-30 DIAGNOSIS — R109 Unspecified abdominal pain: Principal | ICD-10-CM

## 2023-08-30 DIAGNOSIS — Z8679 Personal history of other diseases of the circulatory system: Secondary | ICD-10-CM

## 2023-08-30 DIAGNOSIS — D509 Iron deficiency anemia, unspecified: Secondary | ICD-10-CM | POA: Diagnosis present

## 2023-08-30 DIAGNOSIS — E876 Hypokalemia: Secondary | ICD-10-CM | POA: Diagnosis not present

## 2023-08-30 DIAGNOSIS — I959 Hypotension, unspecified: Secondary | ICD-10-CM | POA: Diagnosis not present

## 2023-08-30 DIAGNOSIS — Z8669 Personal history of other diseases of the nervous system and sense organs: Secondary | ICD-10-CM

## 2023-08-30 DIAGNOSIS — Z1152 Encounter for screening for COVID-19: Secondary | ICD-10-CM | POA: Diagnosis not present

## 2023-08-30 DIAGNOSIS — R0602 Shortness of breath: Secondary | ICD-10-CM | POA: Diagnosis not present

## 2023-08-30 DIAGNOSIS — F325 Major depressive disorder, single episode, in full remission: Secondary | ICD-10-CM | POA: Diagnosis present

## 2023-08-30 DIAGNOSIS — D72829 Elevated white blood cell count, unspecified: Secondary | ICD-10-CM | POA: Diagnosis not present

## 2023-08-30 DIAGNOSIS — G4730 Sleep apnea, unspecified: Secondary | ICD-10-CM | POA: Diagnosis present

## 2023-08-30 DIAGNOSIS — K259 Gastric ulcer, unspecified as acute or chronic, without hemorrhage or perforation: Secondary | ICD-10-CM | POA: Diagnosis not present

## 2023-08-30 DIAGNOSIS — K2950 Unspecified chronic gastritis without bleeding: Secondary | ICD-10-CM | POA: Diagnosis not present

## 2023-08-30 DIAGNOSIS — J441 Chronic obstructive pulmonary disease with (acute) exacerbation: Secondary | ICD-10-CM | POA: Diagnosis not present

## 2023-08-30 DIAGNOSIS — Z91041 Radiographic dye allergy status: Secondary | ICD-10-CM

## 2023-08-30 DIAGNOSIS — E7800 Pure hypercholesterolemia, unspecified: Secondary | ICD-10-CM | POA: Diagnosis not present

## 2023-08-30 DIAGNOSIS — R42 Dizziness and giddiness: Secondary | ICD-10-CM | POA: Diagnosis not present

## 2023-08-30 DIAGNOSIS — Z23 Encounter for immunization: Secondary | ICD-10-CM | POA: Diagnosis present

## 2023-08-30 DIAGNOSIS — J449 Chronic obstructive pulmonary disease, unspecified: Secondary | ICD-10-CM | POA: Diagnosis present

## 2023-08-30 DIAGNOSIS — D638 Anemia in other chronic diseases classified elsewhere: Secondary | ICD-10-CM | POA: Insufficient documentation

## 2023-08-30 DIAGNOSIS — G40909 Epilepsy, unspecified, not intractable, without status epilepticus: Secondary | ICD-10-CM | POA: Diagnosis present

## 2023-08-30 DIAGNOSIS — M1990 Unspecified osteoarthritis, unspecified site: Secondary | ICD-10-CM | POA: Diagnosis present

## 2023-08-30 DIAGNOSIS — G894 Chronic pain syndrome: Secondary | ICD-10-CM | POA: Diagnosis present

## 2023-08-30 DIAGNOSIS — R569 Unspecified convulsions: Secondary | ICD-10-CM | POA: Diagnosis not present

## 2023-08-30 DIAGNOSIS — Z90710 Acquired absence of both cervix and uterus: Secondary | ICD-10-CM

## 2023-08-30 DIAGNOSIS — D860 Sarcoidosis of lung: Secondary | ICD-10-CM | POA: Diagnosis not present

## 2023-08-30 DIAGNOSIS — Z79899 Other long term (current) drug therapy: Secondary | ICD-10-CM

## 2023-08-30 DIAGNOSIS — Z882 Allergy status to sulfonamides status: Secondary | ICD-10-CM

## 2023-08-30 DIAGNOSIS — Z888 Allergy status to other drugs, medicaments and biological substances status: Secondary | ICD-10-CM

## 2023-08-30 DIAGNOSIS — R000 Tachycardia, unspecified: Secondary | ICD-10-CM | POA: Diagnosis not present

## 2023-08-30 DIAGNOSIS — Z86010 Personal history of colonic polyps: Secondary | ICD-10-CM | POA: Diagnosis not present

## 2023-08-30 DIAGNOSIS — Z7951 Long term (current) use of inhaled steroids: Secondary | ICD-10-CM

## 2023-08-30 DIAGNOSIS — K5900 Constipation, unspecified: Secondary | ICD-10-CM | POA: Diagnosis present

## 2023-08-30 DIAGNOSIS — Z981 Arthrodesis status: Secondary | ICD-10-CM

## 2023-08-30 DIAGNOSIS — R1084 Generalized abdominal pain: Secondary | ICD-10-CM | POA: Diagnosis not present

## 2023-08-30 DIAGNOSIS — Z9049 Acquired absence of other specified parts of digestive tract: Secondary | ICD-10-CM | POA: Diagnosis not present

## 2023-08-30 LAB — CBC WITH DIFFERENTIAL/PLATELET
Abs Immature Granulocytes: 0.39 10*3/uL — ABNORMAL HIGH (ref 0.00–0.07)
Basophils Absolute: 0.1 10*3/uL (ref 0.0–0.1)
Basophils Relative: 0 %
Eosinophils Absolute: 0.1 10*3/uL (ref 0.0–0.5)
Eosinophils Relative: 0 %
HCT: 26.5 % — ABNORMAL LOW (ref 36.0–46.0)
Hemoglobin: 8.1 g/dL — ABNORMAL LOW (ref 12.0–15.0)
Immature Granulocytes: 2 %
Lymphocytes Relative: 6 %
Lymphs Abs: 1.4 10*3/uL (ref 0.7–4.0)
MCH: 23.3 pg — ABNORMAL LOW (ref 26.0–34.0)
MCHC: 30.6 g/dL (ref 30.0–36.0)
MCV: 76.4 fL — ABNORMAL LOW (ref 80.0–100.0)
Monocytes Absolute: 1.8 10*3/uL — ABNORMAL HIGH (ref 0.1–1.0)
Monocytes Relative: 7 %
Neutro Abs: 21.1 10*3/uL — ABNORMAL HIGH (ref 1.7–7.7)
Neutrophils Relative %: 85 %
Platelets: 560 10*3/uL — ABNORMAL HIGH (ref 150–400)
RBC: 3.47 MIL/uL — ABNORMAL LOW (ref 3.87–5.11)
RDW: 18.6 % — ABNORMAL HIGH (ref 11.5–15.5)
WBC: 24.8 10*3/uL — ABNORMAL HIGH (ref 4.0–10.5)
nRBC: 0.1 % (ref 0.0–0.2)

## 2023-08-30 LAB — RETICULOCYTES
Immature Retic Fract: 29.7 % — ABNORMAL HIGH (ref 2.3–15.9)
RBC.: 3.37 MIL/uL — ABNORMAL LOW (ref 3.87–5.11)
Retic Count, Absolute: 20.6 10*3/uL (ref 19.0–186.0)
Retic Ct Pct: 0.6 % (ref 0.4–3.1)

## 2023-08-30 LAB — GASTROINTESTINAL PANEL BY PCR, STOOL (REPLACES STOOL CULTURE)

## 2023-08-30 LAB — COMPREHENSIVE METABOLIC PANEL
ALT: 16 U/L (ref 0–44)
AST: 19 U/L (ref 15–41)
Albumin: 2.7 g/dL — ABNORMAL LOW (ref 3.5–5.0)
Alkaline Phosphatase: 123 U/L (ref 38–126)
Anion gap: 12 (ref 5–15)
BUN: 37 mg/dL — ABNORMAL HIGH (ref 8–23)
CO2: 17 mmol/L — ABNORMAL LOW (ref 22–32)
Calcium: 8.8 mg/dL — ABNORMAL LOW (ref 8.9–10.3)
Chloride: 107 mmol/L (ref 98–111)
Creatinine, Ser: 1.54 mg/dL — ABNORMAL HIGH (ref 0.44–1.00)
GFR, Estimated: 37 mL/min — ABNORMAL LOW (ref >60)
Glucose, Bld: 106 mg/dL — ABNORMAL HIGH (ref 70–99)
Potassium: 4.4 mmol/L (ref 3.5–5.1)
Sodium: 136 mmol/L (ref 135–145)
Total Bilirubin: 0.3 mg/dL (ref 0.3–1.2)
Total Protein: 7 g/dL (ref 6.5–8.1)

## 2023-08-30 LAB — URINALYSIS, W/ REFLEX TO CULTURE (INFECTION SUSPECTED)
Bilirubin Urine: NEGATIVE
Glucose, UA: NEGATIVE mg/dL
Hgb urine dipstick: NEGATIVE
Ketones, ur: NEGATIVE mg/dL
Nitrite: NEGATIVE
Protein, ur: NEGATIVE mg/dL
Specific Gravity, Urine: 1.023 (ref 1.005–1.030)
pH: 5 (ref 5.0–8.0)

## 2023-08-30 LAB — C DIFFICILE QUICK SCREEN W PCR REFLEX
C Diff antigen: NEGATIVE
C Diff interpretation: NOT DETECTED
C Diff toxin: NEGATIVE

## 2023-08-30 LAB — PROTIME-INR
INR: 1.1 (ref 0.8–1.2)
Prothrombin Time: 14.5 s (ref 11.4–15.2)

## 2023-08-30 LAB — TROPONIN I (HIGH SENSITIVITY): Troponin I (High Sensitivity): 17 ng/L (ref <18)

## 2023-08-30 LAB — SARS CORONAVIRUS 2 BY RT PCR: SARS Coronavirus 2 by RT PCR: NEGATIVE

## 2023-08-30 LAB — CREATININE, URINE, RANDOM: Creatinine, Urine: 119 mg/dL

## 2023-08-30 LAB — IRON AND TIBC
Iron: 10 ug/dL — ABNORMAL LOW (ref 28–170)
Saturation Ratios: 4 % — ABNORMAL LOW (ref 10.4–31.8)
TIBC: 288 ug/dL (ref 250–450)
UIBC: 278 ug/dL

## 2023-08-30 LAB — FOLATE: Folate: 20.5 ng/mL (ref >5.9)

## 2023-08-30 LAB — FERRITIN: Ferritin: 54 ng/mL (ref 11–307)

## 2023-08-30 LAB — SODIUM, URINE, RANDOM: Sodium, Ur: 42 mmol/L

## 2023-08-30 LAB — LACTIC ACID, PLASMA
Lactic Acid, Venous: 1 mmol/L (ref 0.5–1.9)
Lactic Acid, Venous: 1.1 mmol/L (ref 0.5–1.9)

## 2023-08-30 MED ORDER — ALBUTEROL SULFATE (2.5 MG/3ML) 0.083% IN NEBU: 2.5000 mg | INHALATION_SOLUTION | RESPIRATORY_TRACT | Status: DC | PRN

## 2023-08-30 MED ORDER — PNEUMOCOCCAL 20-VAL CONJ VACC 0.5 ML IM SUSY
0.5000 mL | PREFILLED_SYRINGE | INTRAMUSCULAR | Status: AC
  Administered 2023-08-31: 0.5 mL via INTRAMUSCULAR
  Filled 2023-08-30: qty 0.5

## 2023-08-30 MED ORDER — FENTANYL CITRATE PF 50 MCG/ML IJ SOSY
50.0000 ug | PREFILLED_SYRINGE | Freq: Once | INTRAMUSCULAR | Status: AC
  Administered 2023-08-30: 50 ug via INTRAVENOUS
  Filled 2023-08-30: qty 1

## 2023-08-30 MED ORDER — IPRATROPIUM-ALBUTEROL 0.5-2.5 (3) MG/3ML IN SOLN
3.0000 mL | Freq: Four times a day (QID) | RESPIRATORY_TRACT | Status: DC
  Administered 2023-08-30: 3 mL via RESPIRATORY_TRACT
  Filled 2023-08-30: qty 3

## 2023-08-30 MED ORDER — PREDNISONE 20 MG PO TABS
40.0000 mg | ORAL_TABLET | Freq: Every day | ORAL | Status: AC
  Administered 2023-08-31 – 2023-09-03 (×4): 40 mg via ORAL
  Filled 2023-08-30 (×4): qty 2

## 2023-08-30 MED ORDER — SODIUM CHLORIDE 0.9 % IV SOLN: Freq: Once | INTRAVENOUS | Status: AC

## 2023-08-30 MED ORDER — PANTOPRAZOLE SODIUM 40 MG IV SOLR
40.0000 mg | Freq: Two times a day (BID) | INTRAVENOUS | Status: DC
  Administered 2023-08-30 – 2023-08-31 (×3): 40 mg via INTRAVENOUS
  Filled 2023-08-30 (×3): qty 10

## 2023-08-30 MED ORDER — HYDROMORPHONE HCL 1 MG/ML IJ SOLN
1.0000 mg | INTRAMUSCULAR | Status: DC | PRN
  Administered 2023-08-30 – 2023-09-04 (×21): 1 mg via INTRAVENOUS
  Filled 2023-08-30 (×23): qty 1

## 2023-08-30 MED ORDER — ENOXAPARIN SODIUM 40 MG/0.4ML IJ SOSY
40.0000 mg | PREFILLED_SYRINGE | INTRAMUSCULAR | Status: DC
  Administered 2023-08-30 – 2023-09-05 (×7): 40 mg via SUBCUTANEOUS
  Filled 2023-08-30 (×7): qty 0.4

## 2023-08-30 MED ORDER — METHYLPREDNISOLONE SODIUM SUCC 125 MG IJ SOLR
125.0000 mg | INTRAMUSCULAR | Status: AC
  Administered 2023-08-30: 125 mg via INTRAVENOUS
  Filled 2023-08-30: qty 2

## 2023-08-30 MED ORDER — IPRATROPIUM-ALBUTEROL 0.5-2.5 (3) MG/3ML IN SOLN
3.0000 mL | Freq: Three times a day (TID) | RESPIRATORY_TRACT | Status: DC
  Administered 2023-08-31 – 2023-09-01 (×5): 3 mL via RESPIRATORY_TRACT
  Filled 2023-08-30 (×5): qty 3

## 2023-08-30 MED ORDER — LEVETIRACETAM 750 MG PO TABS
750.0000 mg | ORAL_TABLET | Freq: Every morning | ORAL | Status: DC
  Administered 2023-08-31 – 2023-09-06 (×7): 750 mg via ORAL
  Filled 2023-08-30 (×7): qty 1

## 2023-08-30 MED ORDER — SODIUM CHLORIDE 0.9 % IV SOLN
2.0000 g | INTRAVENOUS | Status: AC
  Administered 2023-08-30 – 2023-09-05 (×7): 2 g via INTRAVENOUS
  Filled 2023-08-30 (×7): qty 20

## 2023-08-30 MED ORDER — MORPHINE SULFATE (PF) 4 MG/ML IV SOLN
4.0000 mg | Freq: Once | INTRAVENOUS | Status: AC
  Administered 2023-08-30: 4 mg via INTRAVENOUS
  Filled 2023-08-30: qty 1

## 2023-08-30 MED ORDER — LACTATED RINGERS IV SOLN
150.0000 mL/h | INTRAVENOUS | Status: AC
  Administered 2023-08-30: 150 mL/h via INTRAVENOUS

## 2023-08-30 MED ORDER — ONDANSETRON HCL 4 MG/2ML IJ SOLN
4.0000 mg | Freq: Once | INTRAMUSCULAR | Status: AC
  Administered 2023-08-30: 4 mg via INTRAVENOUS
  Filled 2023-08-30: qty 2

## 2023-08-30 MED ORDER — METRONIDAZOLE 500 MG/100ML IV SOLN
500.0000 mg | Freq: Two times a day (BID) | INTRAVENOUS | Status: AC
  Administered 2023-08-30 – 2023-09-05 (×13): 500 mg via INTRAVENOUS
  Filled 2023-08-30 (×14): qty 100

## 2023-08-30 MED ORDER — SODIUM CHLORIDE 0.9 % IV BOLUS
1000.0000 mL | Freq: Once | INTRAVENOUS | Status: AC
  Administered 2023-08-30: 1000 mL via INTRAVENOUS

## 2023-08-30 NOTE — Assessment & Plan Note (Signed)
BP stable Titrate home regimen 

## 2023-08-30 NOTE — ED Provider Notes (Signed)
[facility] Provider Note    Event Date/Time   First MD Initiated Contact with Patient 08/30/23 1051     (approximate)  History   Chief Complaint: Abdominal Pain and Back Pain  HPI  Jean Gomez is a 67 y.o. 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  According to the patient over the past 3 days or so she has been experiencing pain across her abdomen along with nausea and diarrhea.  Patient states she has been feeling somewhat weak.  Denies any urinary symptoms.  Patient went to her doctor was found to be hypotensive and was sent immediately to the emergency department for further evaluation.  Physical Exam   Triage Vital Signs: ED Triage Vitals  Encounter Vitals Group     BP 08/30/23 1029 (!) 84/60     Systolic BP Percentile --      Diastolic BP Percentile --      Pulse Rate 08/30/23 1029 (!) 116     Resp 08/30/23 1029 16     Temp 08/30/23 1029 99.1 F (37.3 C)     Temp Source 08/30/23 1029 Oral     SpO2 08/30/23 1029 100 %     Weight 08/30/23 1030 175 lb (79.4 kg)     Height 08/30/23 1030 5\' 3"  (1.6 m)     Head Circumference --      Peak Flow --      Pain Score 08/30/23 1030 10     Pain Loc --      Pain Education --      Exclude from Growth Chart --     Most recent vital signs: Vitals:   08/30/23 1029 08/30/23 1045  BP: (!) 84/60 91/64  Pulse: (!) 116 68  Resp: 16 14  Temp: 99.1 F (37.3 C)   SpO2: 100% 96%    General: Awake, no distress.  CV:  Good peripheral perfusion.  Regular rate and rhythm  Resp:  Normal effort.  Equal breath sounds bilaterally.  Abd:  No distention.  Soft, mild diffuse tenderness to palpation without rebound or guarding.  No focal tenderness identified.  ED Results / Procedures / Treatments   EKG  EKG viewed and interpreted by myself shows a sinus rhythm at 95 bpm with a narrow QRS,  normal axis, normal intervals, no concerning ST changes.  RADIOLOGY  I have reviewed and interpreted CT images.  No obstruction seen on my evaluation.  No significant findings. Radiology is read the CT scan as fluid in the right colon reflecting a diarrheal illness.   MEDICATIONS ORDERED IN ED: Medications  sodium chloride 0.9 % bolus 1,000 mL (has no administration in time range)  fentaNYL (SUBLIMAZE) injection 50 mcg (has no administration in time range)  ondansetron (ZOFRAN) injection 4 mg (has no administration in time range)     IMPRESSION / MDM / ASSESSMENT AND PLAN / ED COURSE  I reviewed the triage vital signs and the nursing notes.  Patient's presentation is most consistent with acute presentation with potential threat to life or bodily function.  Patient presents the emergency department for abdominal pain nausea diarrhea.  Overall the patient appears well mild diffuse tenderness on exam without focal or significant tenderness identified.  No shortness of breath.  Patient states low-grade subjective fever around 99-100.  We obtained a COVID swab as a precaution.  Will obtain CT  imaging of the abdomen/pelvis.  We will IV hydrate and continue to closely monitor while awaiting results.  Patient found to be hypotensive initially 84/60, has improved to the low 90s without intervention we will begin IV fluid shortly.  Patient's lab work has resulted showing significant leukocytosis of 24,000 with chronic anemia hemoglobin of 8.1.  Patient's chemistry shows creatinine 1.5 from a baseline of 0.8 indicating acute kidney injury.  Patient states she has been experiencing multiple episodes of diarrhea over the past 3 to 4 days although states the diarrhea has been ongoing intermittently times weeks to months.  Patient states yesterday she estimates 10-20 bowel movements.  Given the leukocytosis acute kidney injury likely related to dehydration with CT consistent with diarrheal illness I have  sent C. difficile testing we will continue to IV hydrate and treat the patient's discomfort.  Will admit to the hospital service for further workup and treatment.  Patient agreeable to plan of care.  Urinalysis shows no significant finding.  COVID test negative.  FINAL CLINICAL IMPRESSION(S) / ED DIAGNOSES   Abdominal pain Diarrhea Nausea Leukocytosis Acute kidney injury  Note:  This document was prepared using Dragon voice recognition software and may include unintentional dictation errors.   Minna Antis, MD 08/30/23 1442

## 2023-08-30 NOTE — Assessment & Plan Note (Signed)
Recurring episodes of nonbilious nonbloody diarrhea over several weeks has progressively worsened per the patient Noted EGD in July of this year with Dr. Mia Creek with finding of gastric ulcers White count 25 CT scan with noted fluid collection in the right colon consistent with diarrhea Will place on IV Rocephin and Flagyl for infectious coverage Formally consult gastroenterology with Dr. Timothy Lasso for further evaluation C. difficile and GI panel Follow-up GI recommendations

## 2023-08-30 NOTE — H&P (Addendum)
History and Physical    Patient: Jean Gomez ZOX:[id number] DOB: 1956-09-02 DOA: 08/30/2023 DOS: the patient was seen and examined on 08/30/2023 PCP: Sherron Monday, MD  Patient coming from: Home  Chief Complaint:  Chief Complaint  Patient presents with   Abdominal Pain   Back Pain   HPI: Jean Gomez is a 67 y.o. female with medical history significant of sarcoidosis, chronic pain, chronic respiratory failure on 2 L, depression, hyperlipidemia, seizure disorder presenting with sepsis, diarrhea.  Patient reports several weeks to months of recurring nonbilious nonbloody diarrhea.  Symptoms have progressively worsened especially over the past few weeks.  No fevers or chills.  Positive nausea mild generalized abdominal pain.  No chest pain.  Does have some shortness of breath and wheezing over the past week or so in setting of COPD.  Non-smoker.  Baseline sarcoidosis with chronic respiratory failure on 3 L.  Stable home oxygen use.  Has been using inhalers with minimal improvement in symptoms.  No reported rhinorrhea and nasal congestion. Presented to the ER Tmax nine 9.1, heart rate 100s, systolic blood pressure initially in the 80s however improved to the 120s with IV fluid resuscitation.  O2 sats stable at 100% on 3 L.  White count 24.8, hemoglobin 8.1, platelets 560.  Lactate 1.  Creatinine 1.54.  CT abdomen pelvis with fluid in right colon which may be indicative of acute diarrheal illness. Review of Systems: As mentioned in the history of present illness. All other systems reviewed and are negative. Past Medical History:  Diagnosis Date   Acute postoperative pain 01/16/2019   Allergy    Asthma    Cerebral aneurysm    Chest pain 11/01/2016   Chronic migraine    Migraine   Chronic pain syndrome    Chronic respiratory failure (HCC)    Constipation    COPD (chronic obstructive pulmonary disease) (HCC)    Depression    major depression in remission   Hypercholesterolemia     Hypertension    Osteoarthritis    Pneumonia    12/2018   Sarcoid    Seizures (HCC)    Sleep apnea    Past Surgical History:  Procedure Laterality Date   ABDOMINAL HYSTERECTOMY     BACK SURGERY     spine fusion anterior lumbar with open posterior   BIOPSY  07/02/2023   Procedure: BIOPSY;  Surgeon: Regis Bill, MD;  Location: [facility] ENDOSCOPY;  Service: Endoscopy;;   CHOLECYSTECTOMY     COLONOSCOPY WITH PROPOFOL N/A 08/19/2019   Procedure: COLONOSCOPY WITH PROPOFOL;  Surgeon: Christena Deem, MD;  Location: [facility] ENDOSCOPY;  Service: Endoscopy;  Laterality: N/A;   ESOPHAGOGASTRODUODENOSCOPY (EGD) WITH PROPOFOL N/A 08/19/2019   Procedure: ESOPHAGOGASTRODUODENOSCOPY (EGD) WITH PROPOFOL;  Surgeon: Christena Deem, MD;  Location: [facility] ENDOSCOPY;  Service: Endoscopy;  Laterality: N/A;   ESOPHAGOGASTRODUODENOSCOPY (EGD) WITH PROPOFOL N/A 08/04/2020   Procedure: ESOPHAGOGASTRODUODENOSCOPY (EGD) WITH PROPOFOL;  Surgeon: Earline Mayotte, MD;  Location: [facility] ENDOSCOPY;  Service: Endoscopy;  Laterality: N/A;   ESOPHAGOGASTRODUODENOSCOPY (EGD) WITH PROPOFOL N/A 11/05/2020   Procedure: ESOPHAGOGASTRODUODENOSCOPY (EGD) WITH PROPOFOL;  Surgeon: Earline Mayotte, MD;  Location: [facility] ENDOSCOPY;  Service: Endoscopy;  Laterality: N/A;   ESOPHAGOGASTRODUODENOSCOPY (EGD) WITH PROPOFOL N/A 07/02/2023   Procedure: ESOPHAGOGASTRODUODENOSCOPY (EGD) WITH PROPOFOL;  Surgeon: Regis Bill, MD;  Location: [facility] ENDOSCOPY;  Service: Endoscopy;  Laterality: N/A;   SPINAL FUSION     Social History:  reports that she has never smoked. She has never used  smokeless tobacco. She reports current alcohol use. She reports that she does not use drugs.  Allergies  Allergen Reactions   Contrast Media [Iodinated Contrast Media] Swelling   Gabapentin    Labetalol Other (See Comments)    Made hair fall out   Sulfabenzamide Nausea Only   Penicillins Rash   Pregabalin Other (See Comments)    The patient  complained of hemifacial numbness with the use of the medication.    Family History  Problem Relation Age of Onset   Dementia Mother    Breast cancer Mother    Throat cancer Father     Prior to Admission medications   Medication Sig Start Date End Date Taking? Authorizing Provider  albuterol (PROVENTIL) (2.5 MG/3ML) 0.083% nebulizer solution Take 2.5 mg by nebulization every 6 (six) hours as needed for wheezing or shortness of breath.    [provider]  Albuterol Sulfate 108 (90 Base) MCG/ACT AEPB Inhale 2 puffs into the lungs every 6 (six) hours as needed.    [provider]  amLODipine-olmesartan (AZOR) 5-40 MG tablet TAKE 1 TABLET BY MOUTH EVERY MORNING 06/20/23   Sherron Monday, MD  ARIPiprazole (ABILIFY) 5 MG tablet Take 5 mg by mouth at bedtime. 07/07/20   [provider]  ARNUITY ELLIPTA 100 MCG/ACT AEPB INHALE 1 INHALATION INTO THE LUNGS ONCE DAILY. 07/09/23   Sherron Monday, MD  aspirin EC 81 MG tablet Take 1 tablet (81 mg total) by mouth daily. 04/19/18   Clapacs, Jackquline Denmark, MD  atorvastatin (LIPITOR) 80 MG tablet TAKE 1 TABLET BY MOUTH NIGHTLY 08/07/23   Sherron Monday, MD  betamethasone, augmented, (DIPROLENE) 0.05 % lotion Apply topically 2 (two) times daily.    [provider]  busPIRone (BUSPAR) 15 MG tablet Take 15 mg by mouth 2 (two) times daily. 04/09/20   [provider]  calcium carbonate (OS-CAL) 1250 (500 Ca) MG chewable tablet Chew by mouth daily.    [provider]  citalopram (CELEXA) 40 MG tablet TAKE ONE TABLET BY MOUTH EVERY MORNING 05/28/23   Sherron Monday, MD  clotrimazole-betamethasone (LOTRISONE) cream Apply 1 application topically 2 (two) times daily. 04/19/18   Clapacs, Jackquline Denmark, MD  dicyclomine (BENTYL) 20 MG tablet Take 1 tablet (20 mg total) by mouth every 6 (six) hours as needed for up to 7 days for spasms. 06/26/23 07/03/23  Trinna Post, MD  diphenhydrAMINE (BENADRYL) 25 MG tablet Take 25 mg by mouth  every 6 (six) hours as needed.    [provider]  donepezil (ARICEPT) 10 MG tablet TAKE ONE TABLET BY MOUTH EVERY MORNING 07/30/23   Sherron Monday, MD  Elastic Bandages & Supports (MEDICAL COMPRESSION STOCKINGS) MISC Please provide compression stockings 12/30/18   Minna Antis, MD  hydrochlorothiazide (HYDRODIURIL) 25 MG tablet TAKE 1 TABLET BY MOUTH EVERY MORNING 06/25/23   Sherron Monday, MD  levETIRAcetam (KEPPRA) 750 MG tablet TAKE ONE TABLET BY MOUTH EVERY MORNING 06/25/23   Sherron Monday, MD  LINZESS 290 MCG CAPS capsule TAKE 1 CAPSULE BY MOUTH EVERY MORNING 07/09/23   Sherron Monday, MD  mirtazapine (REMERON) 15 MG tablet TAKE ONE TABLET BY MOUTH AT BEDTIME 08/07/23   Sherron Monday, MD  nitroGLYCERIN (NITROSTAT) 0.4 MG SL tablet Place 0.4 mg under the tongue every 5 (five) minutes as needed for chest pain.    [provider]  omeprazole (PRILOSEC OTC) 20 MG tablet Take 1 tablet (20 mg total) by mouth daily  for 28 days. 06/26/23 07/24/23  Trinna Post, MD  omeprazole (PRILOSEC) 20 MG capsule TAKE 1 CAPSULE BY MOUTH ONCE DAILY 07/09/23   Sherron Monday, MD  pantoprazole (PROTONIX) 40 MG tablet TAKE ONE TABLET BY MOUTH EVERY MORNING 06/12/23   Sherron Monday, MD  potassium chloride SA (KLOR-CON M) 20 MEQ tablet TAKE ONE TABLET BY MOUTH EVERY MORNING 05/28/23   Sherron Monday, MD  promethazine (PHENERGAN) 25 MG tablet Take 1 tablet (25 mg total) by mouth every 6 (six) hours as needed for nausea or vomiting. 04/30/23 05/30/23  Sherron Monday, MD  QUEtiapine (SEROQUEL) 400 MG tablet Take 400 mg by mouth at bedtime.    [provider]  sucralfate (CARAFATE) 1 g tablet TAKE ONE TABLET BY MOUTH FOUR TIMES DAILY BEFORE MEALS AND AT BEDTIME 03/20/23   Boswell, Gareth Morgan, NP  Suvorexant (BELSOMRA) 20 MG TABS TAKE ONE TABLET BY MOUTH AT BEDTIME 08/07/23   Sherron Monday, MD  tiZANidine (ZANAFLEX) 4 MG tablet Take 1 tablet (4 mg total) by mouth 3  (three) times daily. 03/29/20 05/09/23  Delano Metz, MD  torsemide (DEMADEX) 20 MG tablet Take 1 tablet (20 mg total) by mouth daily. 12/30/18   Minna Antis, MD  traZODone (DESYREL) 100 MG tablet TAKE 1 TABLET BY MOUTH NIGHTLY 04/09/23   Orson Eva, NP  venlafaxine (EFFEXOR) 75 MG tablet TK 1 T PO BID 09/06/18   [provider]    Physical Exam: Vitals:   08/30/23 1330 08/30/23 1400 08/30/23 1530 08/30/23 1605  BP: 106/67 116/68 119/67 106/67  Pulse: (!) 104 (!) 106 (!) 102 89  Resp: 19 (!) 23 17 20   Temp:    98.3 F (36.8 C)  TempSrc:      SpO2: 99% 100% 100% 99%  Weight:      Height:       Physical Exam Constitutional:      Appearance: She is normal weight.  HENT:     Head: Normocephalic and atraumatic.     Nose: Nose normal.     Mouth/Throat:     Mouth: Mucous membranes are dry.  Eyes:     Extraocular Movements: Extraocular movements intact.  Cardiovascular:     Rate and Rhythm: Normal rate and regular rhythm.  Pulmonary:     Effort: Pulmonary effort is normal.     Breath sounds: Wheezing present.  Abdominal:     General: Bowel sounds are normal.  Musculoskeletal:        General: Normal range of motion.  Skin:    General: Skin is warm and dry.  Neurological:     General: No focal deficit present.  Psychiatric:        Mood and Affect: Mood normal.     Data Reviewed:  There are no new results to review at this time. DG Chest Port 1 View CLINICAL DATA:  Shortness of breath.  EXAM: PORTABLE CHEST 1 VIEW  COMPARISON:  12/30/2022  FINDINGS: Heart size and mediastinal contours are stable. Diffuse coarsened interstitial markings. Extensive bilateral upper lobe confluent scarring with upward retraction of the hila bilaterally. No superimposed pleural effusion, interstitial edema or airspace disease. Visualized osseous structures are unremarkable.  IMPRESSION: 1. No acute findings. 2. Extensive bilateral upper lobe confluent scarring  with upward retraction of the hila bilaterally.  Electronically Signed   By: Signa Kell M.D.   On: 08/30/2023 13:56 CT ABDOMEN PELVIS WO CONTRAST CLINICAL DATA:  Abdominal pain  EXAM: CT ABDOMEN AND PELVIS  WITHOUT CONTRAST  TECHNIQUE: Multidetector CT imaging of the abdomen and pelvis was performed following the standard protocol without IV contrast.  RADIATION DOSE REDUCTION: This exam was performed according to the departmental dose-optimization program which includes automated exposure control, adjustment of the mA and/or kV according to patient size and/or use of iterative reconstruction technique.  COMPARISON:  CT abdomen/pelvis 06/26/2023  FINDINGS: Lower chest: Relative hyperlucency in the right middle lobe as well as traction bronchiectasis and architectural distortion in the right lower lobe are unchanged. The imaged heart is unremarkable.  Hepatobiliary: The liver is unremarkable. The gallbladder is surgically absent. There is no biliary ductal dilatation.  Pancreas: Unremarkable.  Spleen: Unremarkable.  Adrenals/Urinary Tract: The adrenals are unremarkable  The kidneys are unremarkable, with no focal lesion, stone, hydronephrosis, or hydroureter. The bladder is decompressed but grossly unremarkable.  Stomach/Bowel: The stomach is unremarkable. There is no evidence of bowel obstruction. There is no abnormal bowel wall thickening or inflammatory change. The appendix is normal. There is fluid in the right colon.  Vascular/Lymphatic: There is mild calcified plaque in the nonaneurysmal abdominal aorta. There are few prominent retroperitoneal lymph nodes measuring up to 1.0 cm at the level of the aortic bifurcation, nonspecific but not significantly changed since 2020 and favored reactive. There is no new or progressive abdominopelvic lymphadenopathy.  Reproductive: The uterus and adnexa are unremarkable.  Other: There is no ascites or free  air.  Musculoskeletal: There is no acute osseous abnormality or suspicious osseous lesion.  IMPRESSION: Fluid in the right colon may reflect a diarrheal illness. Otherwise, no acute findings in the abdomen or pelvis.  Electronically Signed   By: Lesia Hausen M.D.   On: 08/30/2023 12:22  Lab Results  Component Value Date   WBC 24.8 (H) 08/30/2023   HGB 8.1 (L) 08/30/2023   HCT 26.5 (L) 08/30/2023   MCV 76.4 (L) 08/30/2023   PLT 560 (H) 08/30/2023   Last metabolic panel Lab Results  Component Value Date   GLUCOSE 106 (H) 08/30/2023   NA 136 08/30/2023   K 4.4 08/30/2023   CL 107 08/30/2023   CO2 17 (L) 08/30/2023   BUN 37 (H) 08/30/2023   CREATININE 1.54 (H) 08/30/2023   GFRNONAA 37 (L) 08/30/2023   CALCIUM 8.8 (L) 08/30/2023   PROT 7.0 08/30/2023   ALBUMIN 2.7 (L) 08/30/2023   LABGLOB 3.1 05/21/2023   AGRATIO 1.2 05/21/2023   BILITOT 0.3 08/30/2023   ALKPHOS 123 08/30/2023   AST 19 08/30/2023   ALT 16 08/30/2023   ANIONGAP 12 08/30/2023    Assessment and Plan: * Diarrhea Recurring episodes of nonbilious nonbloody diarrhea over several weeks has progressively worsened per the patient Noted EGD in July of this year with Dr. Mia Creek with finding of gastric ulcers White count 25 CT scan with noted fluid collection in the right colon consistent with diarrhea Will place on IV Rocephin and Flagyl for infectious coverage Formally consult gastroenterology with Dr. Timothy Lasso for further evaluation C. difficile and GI panel Follow-up GI recommendations  Sepsis [facility]) Meeting sepsis criteria with heart rate in 100s as well as white count of 25 Had transient systolic blood pressure in the 80s so would technically be within the severe sepsis criteria though overall reassuring presentation Lactate 1 Noted likely predominant GI source in the setting of persistent diarrhea CT of the abdomen pelvis with fluid in the right colon Will place on IV Rocephin and Flagyl for infectious  coverage Pancultured C. difficile and GI panel Will consult  gastroenterology for further evaluation given significant white count and persistence of symptoms in the setting of sarcoidosis  COPD (chronic obstructive pulmonary disease) (HCC) Positive exacerbation with increased work of breathing, cough or wheezing over multiple days IV Solu-Medrol DuoNebs IV Rocephin in the setting of sepsis associated with persistent diarrhea Continue supplemental oxygen in setting of chronic respiratory failure on 3 L at baseline Monitor  Anemia of chronic disease Hemoglobin 8 today with baseline hemoglobin around 8-10 in setting of chronic medical issues including sarcoidosis and chronic respiratory failure No reported active bleeding Will monitor for now Transfuse for hemoglobin less than 7 Check anemia panel Monitor  AKI (acute kidney injury) (HCC) Creatinine 1.54 with GFR in the 30s Clinically dry IV fluid hydration FENa Hold nephrotoxic agents  Multiple gastric ulcers Noted gastric ulcers on EGD July 2023 with Dr. Mia Creek Schedule for repeat EGD October 25 IV PPI for now in the setting of IV steroid use associated with COPD exacerbation Otherwise monitor closely  Sarcoidosis Baseline sarcoidosis with pulmonary manifestations Noted active COPD flare with IV steroid use Monitor  Hypercholesterolemia Statin  History of seizure disorder Stable at present Continue Keppra  HTN, goal below 140/90 BP stable Titrate home regimen  Chronic respiratory failure with hypoxia (HCC) Chronic respiratory failure on 3 L at home Appears stable on 3 L at present though with increased work of breathing cough or wheezing-concern for COPD exacerbation in the setting of sarcoid Noted extensive upper lobe bilateral scarring on chest x-ray-likely chronic Continue supplemental oxygen as needed COPD exacerbation treatment Otherwise continue to monitor Consider CT of the chest as clinically  appropriate    Greater than 50% was spent in counseling and coordination of care with patient Total encounter time 80 minutes or more   Advance Care Planning:   Code Status: Prior   Consults: Gastroenterology   Family Communication: No family at the bedside   Severity of Illness: The appropriate patient status for this patient is INPATIENT. Inpatient status is judged to be reasonable and necessary in order to provide the required intensity of service to ensure the patient's safety. The patient's presenting symptoms, physical exam findings, and initial radiographic and laboratory data in the context of their chronic comorbidities is felt to place them at high risk for further clinical deterioration. Furthermore, it is not anticipated that the patient will be medically stable for discharge from the hospital within 2 midnights of admission.   * I certify that at the point of admission it is my clinical judgment that the patient will require inpatient hospital care spanning beyond 2 midnights from the point of admission due to high intensity of service, high risk for further deterioration and high frequency of surveillance required.*  Author: Floydene Flock, MD 08/30/2023 5:12 PM  For on call review [email or web address].

## 2023-08-30 NOTE — Assessment & Plan Note (Signed)
Baseline sarcoidosis with pulmonary manifestations Noted active COPD flare with IV steroid use Monitor

## 2023-08-30 NOTE — ED Triage Notes (Signed)
Pt states that she has been having upper abd pain that radiates across the top and around to her back, pt states that she has had this pain before and it was her sarcoidosis acting up. Pt is always on 3L of O2

## 2023-08-30 NOTE — Assessment & Plan Note (Signed)
Positive exacerbation with increased work of breathing, cough or wheezing over multiple days IV Solu-Medrol DuoNebs IV Rocephin in the setting of sepsis associated with persistent diarrhea Continue supplemental oxygen in setting of chronic respiratory failure on 3 L at baseline Monitor

## 2023-08-30 NOTE — Plan of Care (Signed)

## 2023-08-30 NOTE — Assessment & Plan Note (Signed)
Stable at present Continue Keppra

## 2023-08-30 NOTE — Assessment & Plan Note (Signed)
Meeting sepsis criteria with heart rate in 100s as well as white count of 25 Had transient systolic blood pressure in the 80s so would technically be within the severe sepsis criteria though overall reassuring presentation Lactate 1 Noted likely predominant GI source in the setting of persistent diarrhea CT of the abdomen pelvis with fluid in the right colon Will place on IV Rocephin and Flagyl for infectious coverage Pancultured C. difficile and GI panel Will consult gastroenterology for further evaluation given significant white count and persistence of symptoms in the setting of sarcoidosis

## 2023-08-30 NOTE — Assessment & Plan Note (Signed)
Creatinine 1.54 with GFR in the 30s Clinically dry IV fluid hydration FENa Hold nephrotoxic agents

## 2023-08-30 NOTE — Assessment & Plan Note (Signed)
Chronic respiratory failure on 3 L at home Appears stable on 3 L at present though with increased work of breathing cough or wheezing-concern for COPD exacerbation in the setting of sarcoid Noted extensive upper lobe bilateral scarring on chest x-ray-likely chronic Continue supplemental oxygen as needed COPD exacerbation treatment Otherwise continue to monitor Consider CT of the chest as clinically appropriate

## 2023-08-30 NOTE — Assessment & Plan Note (Signed)
Hemoglobin 8 today with baseline hemoglobin around 8-10 in setting of chronic medical issues including sarcoidosis and chronic respiratory failure No reported active bleeding Will monitor for now Transfuse for hemoglobin less than 7 Check anemia panel Monitor

## 2023-08-30 NOTE — Assessment & Plan Note (Signed)
?   Statin.

## 2023-08-30 NOTE — Assessment & Plan Note (Signed)
Noted gastric ulcers on EGD July 2023 with Dr. Mia Creek Schedule for repeat EGD October 25 IV PPI for now in the setting of IV steroid use associated with COPD exacerbation Otherwise monitor closely

## 2023-08-31 DIAGNOSIS — N179 Acute kidney failure, unspecified: Secondary | ICD-10-CM | POA: Diagnosis not present

## 2023-08-31 DIAGNOSIS — D72829 Elevated white blood cell count, unspecified: Secondary | ICD-10-CM | POA: Diagnosis not present

## 2023-08-31 DIAGNOSIS — R109 Unspecified abdominal pain: Secondary | ICD-10-CM

## 2023-08-31 LAB — BASIC METABOLIC PANEL WITH GFR
Anion gap: 12 (ref 5–15)
BUN: 15 mg/dL (ref 8–23)
CO2: 19 mmol/L — ABNORMAL LOW (ref 22–32)
Calcium: 8.5 mg/dL — ABNORMAL LOW (ref 8.9–10.3)
Chloride: 109 mmol/L (ref 98–111)
Creatinine, Ser: 0.77 mg/dL (ref 0.44–1.00)
GFR, Estimated: >60 mL/min
Glucose, Bld: 111 mg/dL — ABNORMAL HIGH (ref 70–99)
Potassium: 3.7 mmol/L (ref 3.5–5.1)
Sodium: 140 mmol/L (ref 135–145)

## 2023-08-31 LAB — CBC WITH DIFFERENTIAL/PLATELET
Abs Immature Granulocytes: 0.59 10*3/uL — ABNORMAL HIGH (ref 0.00–0.07)
Basophils Absolute: 0.1 10*3/uL (ref 0.0–0.1)
Basophils Relative: 0 %
Eosinophils Absolute: 0 10*3/uL (ref 0.0–0.5)
Eosinophils Relative: 0 %
HCT: 26.4 % — ABNORMAL LOW (ref 36.0–46.0)
Hemoglobin: 7.9 g/dL — ABNORMAL LOW (ref 12.0–15.0)
Immature Granulocytes: 2 %
Lymphocytes Relative: 4 %
Lymphs Abs: 1.1 10*3/uL (ref 0.7–4.0)
MCH: 23 pg — ABNORMAL LOW (ref 26.0–34.0)
MCHC: 29.9 g/dL — ABNORMAL LOW (ref 30.0–36.0)
MCV: 76.7 fL — ABNORMAL LOW (ref 80.0–100.0)
Monocytes Absolute: 1 10*3/uL (ref 0.1–1.0)
Monocytes Relative: 4 %
Neutro Abs: 23.2 10*3/uL — ABNORMAL HIGH (ref 1.7–7.7)
Neutrophils Relative %: 90 %
Platelets: 560 10*3/uL — ABNORMAL HIGH (ref 150–400)
RBC: 3.44 MIL/uL — ABNORMAL LOW (ref 3.87–5.11)
RDW: 18.9 % — ABNORMAL HIGH (ref 11.5–15.5)
Smear Review: NORMAL
WBC: 25.9 10*3/uL — ABNORMAL HIGH (ref 4.0–10.5)
nRBC: 0.1 % (ref 0.0–0.2)

## 2023-08-31 LAB — VITAMIN B12: Vitamin B-12: 881 pg/mL (ref 180–914)

## 2023-08-31 LAB — HIV ANTIBODY (ROUTINE TESTING W REFLEX): HIV Screen 4th Generation wRfx: NONREACTIVE

## 2023-08-31 MED ORDER — ADULT MULTIVITAMIN W/MINERALS CH
1.0000 | ORAL_TABLET | Freq: Every day | ORAL | Status: DC
  Administered 2023-09-01 – 2023-09-06 (×6): 1 via ORAL
  Filled 2023-08-31 (×6): qty 1

## 2023-08-31 MED ORDER — BOOST / RESOURCE BREEZE PO LIQD CUSTOM
1.0000 | Freq: Three times a day (TID) | ORAL | Status: DC
  Administered 2023-08-31 – 2023-09-02 (×5): 1 via ORAL

## 2023-08-31 NOTE — Progress Notes (Addendum)
Progress Note   Patient: Jean Gomez ZOX:[id number] DOB: January 02, 1956 DOA: 08/30/2023     1 DOS: the patient was seen and examined on 08/31/2023    Subjective:  Patient seen and examined at bedside this morning Admits to some abdominal discomfort with diarrhea Denies nausea vomiting chest pain cough or urinary complaint    Brief hospital course: From HPI "Jean Gomez is a 67 y.o. female with medical history significant of sarcoidosis, chronic pain, chronic respiratory failure on 2 L, depression, hyperlipidemia, seizure disorder presenting with sepsis, diarrhea.  Patient reports several weeks to months of recurring nonbilious nonbloody diarrhea.  Symptoms have progressively worsened especially over the past few weeks.  No fevers or chills.  Positive nausea mild generalized abdominal pain.  No chest pain.  Does have some shortness of breath and wheezing over the past week or so in setting of COPD.  Non-smoker.  Baseline sarcoidosis with chronic respiratory failure on 3 L.  Stable home oxygen use.  Has been using inhalers with minimal improvement in symptoms.  No reported rhinorrhea and nasal congestion. Presented to the ER Tmax nine 9.1, heart rate 100s, systolic blood pressure initially in the 80s however improved to the 120s with IV fluid resuscitation.  O2 sats stable at 100% on 3 L.  White count 24.8, hemoglobin 8.1, platelets 560.  Lactate 1.  Creatinine 1.54.  CT abdomen pelvis with fluid in right colon which may be indicative of acute diarrheal illness.  "  Assessment and Plan:   Sepsis secondary to acute gastroenteritis  Recurring episodes of nonbilious nonbloody diarrhea over several weeks has progressively worsened per the patient Noted EGD in July of this year with Dr. Mia Creek with finding of gastric ulcers Patient presented with WBC 24 with a pulse of 123 in the setting of acute gastroenteritis Continue IV Rocephin and Flagyl for possible infectious coverage Plan of care  discussed with gastroenterology with Dr. Timothy Lasso f Follow-up on C. difficile test Follow-up GI recommendations I have discussed the case with gastroenterologist who does not recommend any acute endoscopic intervention at this time GI panel reviewed by me shows no suspicious organisms We will continue current IV fluid   Sepsis (HCC) Meeting sepsis criteria with heart rate in 100s as well as white count of 25 Had transient systolic blood pressure in the 80s so would technically be within the severe sepsis criteria though overall reassuring presentation Lactate 1 Noted likely predominant GI source in the setting of persistent diarrhea CT of the abdomen pelvis with fluid in the right colon Continue IV Rocephin and Flagyl for infectious coverage Follow-up on pancultured  COPD (chronic obstructive pulmonary disease) (HCC) Chronic hypoxic respiratory failure Positive exacerbation with increased work of breathing, cough or wheezing over multiple days Continue prednisone to complete 5 days course Continue DuoNebs IV Rocephin in the setting of sepsis associated with persistent diarrhea    Anemia of chronic disease Hemoglobin 8  with baseline hemoglobin around 8-10 in setting of chronic medical issues including sarcoidosis and chronic respiratory failure No reported active bleeding Will monitor for now Transfuse for hemoglobin less than 7 Check anemia panel Monitor   AKI (acute kidney injury) (HCC) Creatinine 1.54 with GFR in the 30s Clinically dry on presentation IV fluid hydration Monitor renal function By nephrotoxic medications   Multiple gastric ulcers Noted gastric ulcers on EGD July 2023 with Dr. Mia Creek Schedule for repeat EGD October 25 IV PPI for now in the setting of IV steroid use associated with COPD exacerbation  Otherwise monitor closely   Sarcoidosis Baseline sarcoidosis with pulmonary manifestations Noted active COPD flare with IV steroid use Monitor    Hypercholesterolemia Continue statin   History of seizure disorder Stable at present Continue Keppra   HTN, goal below 140/90 BP stable Titrate home regimen       Advance Care Planning:   Code Status: Prior    Consults: Gastroenterology    Family Communication: No family at the bedside       Physical Exam: HENT: Normocephalic atraumatic Eyes:     Extraocular Movements: Extraocular movements intact.  Cardiovascular:     Rate and Rhythm: Normal rate and regular rhythm.  Pulmonary: Clear to auscultation bilaterally.  Abdominal:     General: Bowel sounds are normal.  Musculoskeletal:        General: Normal range of motion.  Skin:    General: Skin is warm and dry.  Neurological:     General: No focal deficit present.  Psychiatric:        Mood and Affect: Mood normal.    Vitals:   08/31/23 0946 08/31/23 1325 08/31/23 1418 08/31/23 1439  BP: 126/74  118/67   Pulse: (!) 119  (!) 114   Resp: 16  16   Temp: 98.6 F (37 C)  99.2 F (37.3 C)   TempSrc:   Oral   SpO2: 100% 100% 100%   Weight:    84.7 kg  Height:        Data Reviewed: I have personally reviewed patient's chest x-ray that did not show any acute findings.  I have also reviewed patient's CT scan of the abdomen that showed fluid in the right colon which may represent diarrheal illness but no acute findings.  I have reviewed the CBC as well as CMP results as shown below as well as vitals shown above.     Latest Ref Rng & Units 08/31/2023   11:23 AM 08/30/2023   11:31 AM 06/26/2023    3:49 PM  CBC  WBC 4.0 - 10.5 K/uL 25.9  24.8  21.1   Hemoglobin 12.0 - 15.0 g/dL 7.9  8.1  8.6   Hematocrit 36.0 - 46.0 % 26.4  26.5  27.6   Platelets 150 - 400 K/uL 560  560  601        Latest Ref Rng & Units 08/31/2023   11:23 AM 08/30/2023   11:31 AM 06/26/2023    3:49 PM  CMP  Glucose 70 - 99 mg/dL 098  119  98   BUN 8 - 23 mg/dL 15  37  16   Creatinine 0.44 - 1.00 mg/dL 1.47  8.29  5.62   Sodium 135 - 145 mmol/L  140  136  136   Potassium 3.5 - 5.1 mmol/L 3.7  4.4  3.8   Chloride 98 - 111 mmol/L 109  107  103   CO2 22 - 32 mmol/L 19  17  24    Calcium 8.9 - 10.3 mg/dL 8.5  8.8  8.6   Total Protein 6.5 - 8.1 g/dL  7.0  6.9   Total Bilirubin 0.3 - 1.2 mg/dL  0.3  0.4   Alkaline Phos 38 - 126 U/L  123  92   AST 15 - 41 U/L  19  13   ALT 0 - 44 U/L  16  12       Family Communication: Discussed with patient   Author: Loyce Dys, MD 08/31/2023 4:56 PM  For on call review  [email or web address]

## 2023-08-31 NOTE — Progress Notes (Signed)
Initial Nutrition Assessment  DOCUMENTATION CODES:   Obesity unspecified  INTERVENTION:   Boost Breeze po TID, each supplement provides 250 kcal and 9 grams of protein  MVI po daily   Pt at high refeed risk; recommend monitor potassium, magnesium and phosphorus labs daily until stable  Check vitamin D and B12 labs  NUTRITION DIAGNOSIS:   Inadequate oral intake related to acute illness as evidenced by per patient/family report.  GOAL:   Patient will meet greater than or equal to 90% of their needs  MONITOR:   PO intake, Supplement acceptance, Labs, Weight trends, I & O's, Skin  REASON FOR ASSESSMENT:   Consult Assessment of nutrition requirement/status  ASSESSMENT:   67 y/o female with h/o HTN, seizures, COPD, sarcoidosis, PUD, chronic pain, anxiety, GERD, MDD, OSA, hiatal hernia, DDD and partial colectomy 2016 (after ingesting safety pin) who is admitted with diarrhea and sepsis.  Met with pt in room today. Pt reports decreased appetite and oral intake for several months pta. Pt reports early satiety. Pt reports chronic intermittent diarrhea but reports that this time, she has been having diarrhea for ~ 2 weeks. Pt reports that she is lactose intolerant is unable to tolerate Boost or Ensure as it causes bloating. RD discussed with pt the importance of adequate nutrition needed to preserve lean muscle. Pt is willing to try Boost Breeze in hospital. RD will add supplements and MVI to help pt meet her estimated needs. Pt is likely at refeed risk. Pt reports eating 100% of her full liquid tray this morning. Pt denies any recent weight loss. Pt reports her UBW is 165-175lbs. Per chart, pt is up ~11lbs since admission. Pt with h/o vitamin D deficiency; will check vitamin labs where deficiency can lead to diarrhea.   Medications reviewed and include: lovenox, protonix, prednisone, ceftriaxone, metronidazole  Labs reviewed: K 3.7 wnl Wbc- 25.9(H), Hgb 7.9(L), Hct 26.4(L), MCV  76.7(L), MCH 23.0(L), MCHC 29.9(L)  NUTRITION - FOCUSED PHYSICAL EXAM:  Flowsheet Row Most Recent Value  Orbital Region No depletion  Upper Arm Region No depletion  Thoracic and Lumbar Region No depletion  Buccal Region No depletion  Temple Region No depletion  Clavicle Bone Region No depletion  Clavicle and Acromion Bone Region No depletion  Scapular Bone Region No depletion  Dorsal Hand No depletion  Patellar Region No depletion  Anterior Thigh Region No depletion  Posterior Calf Region No depletion  Edema (RD Assessment) None  Hair Reviewed  Eyes Reviewed  Mouth Reviewed  Skin Reviewed  Nails Reviewed   Diet Order:   Diet Order             Diet full liquid Room service appropriate? Yes; Fluid consistency: Thin  Diet effective now                  EDUCATION NEEDS:   Education needs have been addressed  Skin:  Skin Assessment: Reviewed RN Assessment  Last BM:  9/12- type 7  Height:   Ht Readings from Last 1 Encounters:  08/30/23 5\' 3"  (1.6 m)    Weight:   Wt Readings from Last 1 Encounters:  08/30/23 79.4 kg    Ideal Body Weight:  52.2 kg  BMI:  Body mass index is 31 kg/m.  Estimated Nutritional Needs:   Kcal:  1700-1900kcal/day  Protein:  85-95g/day  Fluid:  1.4-1.6L/day  Betsey Holiday MS, RD, LDN Please refer to [facility] for RD and/or RD on-call/weekend/after hours pager

## 2023-08-31 NOTE — Evaluation (Signed)
Occupational Therapy Evaluation Patient Details Name: KAE WHARFF MRN: [id number] DOB: 1956-01-15 Today's Date: 08/31/2023   History of Present Illness 67 y.o. female with medical history significant of sarcoidosis, chronic pain, chronic respiratory failure on 2 L, depression, hyperlipidemia, seizure disorder presenting with sepsis, diarrhea.  Patient reports several weeks to months of recurring nonbilious nonbloody diarrhea.  Symptoms have progressively worsened especially over the past few weeks.  No fevers or chills.  Positive nausea mild generalized abdominal pain.  No chest pain.  Does have some shortness of breath and wheezing over the past week or so in setting of COPD.  Non-smoker.  Baseline sarcoidosis with chronic respiratory failure on 3 L.  Stable home oxygen use.  Has been using inhalers with minimal improvement in symptoms.  No reported rhinorrhea and nasal congestion.   Clinical Impression   Upon entering the room, pt supine in bed and agreeable to OT intervention. Pt reports living at home alone with use of rollator for safety and energy conservation. Pt on 3Ls via [place] at home and also during evaluation this morning. Pt reports driving and being Ind in self care and IADL tasks. Her daughter does check in on her nightly and she stays with her daughter on the weekends. Pt performing all transfers, toileting, clothing management, and standing hygiene without physical assistance and use of RW. Pt's HR does increase to 140 bpm with toileting tasks during session. She returns to bed at end of session with call bell and all needed items within reach. Pt does not need skilled OT intervention at this time as she is at her functional baseline. OT to complete orders.          Functional Status Assessment  Patient has not had a recent decline in their functional status  Equipment Recommendations  None recommended by OT       Precautions / Restrictions Precautions Precautions:  Fall Precaution Comments: watch HR      Mobility Bed Mobility Overal bed mobility: Modified Independent                  Transfers Overall transfer level: Modified independent Equipment used: Rolling walker (2 wheels)                      Balance Overall balance assessment: Modified Independent                                         ADL either performed or assessed with clinical judgement   ADL Overall ADL's : Modified independent                                       General ADL Comments: with use of RW     Vision Patient Visual Report: No change from baseline              Pertinent Vitals/Pain Pain Assessment Pain Assessment: No/denies pain     Extremity/Trunk Assessment Upper Extremity Assessment Upper Extremity Assessment: Overall WFL for tasks assessed           Communication     Cognition Arousal: Alert Behavior During Therapy: WFL for tasks assessed/performed Overall Cognitive Status: Within Functional Limits for tasks assessed  Home Living Family/patient expects to be discharged to:: Private residence Living Arrangements: Alone Available Help at Discharge: Family;Available PRN/intermittently Type of Home: Apartment Home Access: Level entry     Home Layout: One level     Bathroom Shower/Tub: Producer, television/film/video: Standard     Home Equipment: Cane - single point;Shower seat;Rollator (4 wheels)          Prior Functioning/Environment Prior Level of Function : Independent/Modified Independent;Driving               ADLs Comments: Pt reports use of rollator for ambulation. She is Ind in self care tasks and most IADLs and still drives. Daughter comes to check on her at night and she goes to stay with daugher on the weekends.                 OT Goals(Current goals can be found in the care plan  section) Acute Rehab OT Goals Patient Stated Goal: to go home OT Goal Formulation: With patient Time For Goal Achievement: 08/31/23 Potential to Achieve Goals: Fair  OT Frequency:         AM-PAC OT "6 Clicks" Daily Activity     Outcome Measure Help from another person eating meals?: None Help from another person taking care of personal grooming?: None Help from another person toileting, which includes using toliet, bedpan, or urinal?: None Help from another person bathing (including washing, rinsing, drying)?: None Help from another person to put on and taking off regular upper body clothing?: None Help from another person to put on and taking off regular lower body clothing?: None 6 Click Score: 24   End of Session Equipment Utilized During Treatment: Rolling walker (2 wheels) Nurse Communication: Mobility status  Activity Tolerance: Patient tolerated treatment well Patient left: in bed;with call bell/phone within reach;with bed alarm set                   Time: 1010-1034 OT Time Calculation (min): 24 min Charges:  OT General Charges $OT Visit: 1 Visit OT Evaluation $OT Eval Low Complexity: 1 Low OT Treatments $Self Care/Home Management : 8-22 mins  Jackquline Denmark, MS, OTR/L , CBIS ascom [phone]  08/31/23, 1:41 PM

## 2023-08-31 NOTE — Plan of Care (Signed)

## 2023-08-31 NOTE — Consult Note (Addendum)
GI Inpatient Consult Note  Reason for Consult: Diarrhea   Attending Requesting Consult: Dr. Doree Albee  History of Present Illness: Jean Gomez is a 67 y.o. female seen for evaluation of diarrhea at the request of Dr. Alvester Morin. Patient has a PMH of sarcoidosis, chronic pain, chronic respiratory failure on 2 L, depression, hyperlipidemia, seizure disorder, gastric ulcer presenting with sepsis, diarrhea.   Patient was seen in the GI office yesterday to set up follow up EGD. She was sent to the [facility] ED from the office for  upper abdominal pain that radiates across into the back, nausea, and hypotension with tachycardia, acute on chromic SOB and persistent  leukocytosis. She also reports diarrhea over the last few weeks.   Upon presentation to the ED, vital signs were 99.1, 116,  systolic BP 80 improved with IV fluid resuscitation.  O2 sats stable at 100% on 3 L.  Labs were significant for WBC  24.8, hemoglobin 8.1, platelets 560. Creatinine 1.54,  Lactate 1.0,  troponin 17, Covid neg . Ferritin 54, iron sat 4, serum iron 10. B12 881. Stool negative for PCR and C diff,  Imaging studies revealed CT abdomen pelvis with fluid in right colon which may be indicative of acute diarrheal illness. CXR No acute findings. Extensive bilateral upper lobe confluent scarring with upward retraction of the hila bilaterally. She was treated with IV LR, Rocephin IV 2 g every 24 hours, metronidazole 500 mg IV every 12 hours, pantoprazole 40 mg IV every 12 hours, pain medication, methylprednisolone 125 mg IV, prednisone 40 mg orally this am, Lovenox 40 mg [place].   Patient reports she is still not feeling well.  Her main concern today is onset on Tues she developed more shortness of breath than normal, vague intermittent anterior chest discomfort, sharper abdominal cramps and diarrhea, fatigue. She does not feel better overnight after treatment.    She does take the Linzess 290  mcg every other day and took Linzess   yesterday morning despite having loose stools.  She reports she has had  3-4 liquid bowel movements per day since Tues. Linzess does not normally give her diarrhea like this. She will have bout of constipation every 8-9 days. No weight loss, anorexia, hematochezia.  She has discomfort across her upper abdomen bilaterally that is mild, comes and goes and has sharper pain at times that doubles her over. She is hungry, and has no nausea or vomiting.  Taking her PPI at home as directed and has been avoiding NSAIDs. She is set up for an outpatient EGD 10/12/23 for f/up gastric ulcers.   05/31/2023: Chart review with inconclusive VQ Pulm perfusion study and D dimer abnormal 1.84.   Last Colonoscopy: 08/19/2019:Indic: Personal history of colon polyps. Imp: redundant colon. Repeat in 5 years  Last Endoscopy: 07/02/2023: Indication generalized abdominal pain.  Impression: Normal esophagus.  Nonbleeding gastric ulcers with clean ulcer base (Forrest class III).  Normal examined duodenum.  PPI twice daily.  Allergy: Chronic gastritis, negative H. pylori, metaplasia or malignancy.  Repeat in 3 months.   Past Medical History:  Past Medical History:  Diagnosis Date   Acute postoperative pain 01/16/2019   Allergy    Asthma    Cerebral aneurysm    Chest pain 11/01/2016   Chronic migraine    Migraine   Chronic pain syndrome    Chronic respiratory failure (HCC)    Constipation    COPD (chronic obstructive pulmonary disease) (HCC)    Depression  major depression in remission   Hypercholesterolemia    Hypertension    Osteoarthritis    Pneumonia    12/2018   Sarcoid    Seizures (HCC)    Sleep apnea     Problem List: Patient Active Problem List   Diagnosis Date Noted   Diarrhea 08/30/2023   Sepsis (HCC) 08/30/2023   Multiple gastric ulcers 08/30/2023   AKI (acute kidney injury) (HCC) 08/30/2023   Anemia of chronic disease 08/30/2023   Other spondylosis, sacral and sacrococcygeal region 07/01/2020    History of Allergy to iodine 01/27/2020    Class: History of   Lumbosacral spondylosis with radiculopathy (Right) 01/05/2020   Neurogenic pain 10/29/2019   Osteoarthritis of hip (Right) 10/09/2019   Greater trochanteric bursitis (Right) 10/09/2019   Lumbosacral spinal stenosis 10/09/2019   Lumbosacral (intervertebral disc displacement) (IVDD) 10/09/2019   Lumbar nerve root impingement (Left: L4) (Right: L5) 10/09/2019   Grade 1 Anterolisthesis of L5/S1 10/09/2019   Lumbar facet arthropathy (L3-4, L4-5, L5-S1) 10/09/2019   Lumbosacral facet joint synovial cyst (Right: L5-S1) 10/09/2019   Lumbar foraminal stenosis (Bilateral: L2-S1) 07/10/2019   Abnormal MRI, lumbar spine (07/02/2019) 07/03/2019   Disturbance of skin sensation 06/16/2019   Chronic low back pain (Secondary Area of Pain) (Bilateral) (R>L) w/o sciatica 01/16/2019   Acute on chronic respiratory failure with hypoxia (HCC) 12/18/2018   Cervicogenic headache 10/21/2018   Lumbar spondylosis 06/25/2018   History of allergy to radiographic contrast media 05/30/2018   Bipolar affective disorder, current episode manic (HCC) 04/17/2018   Chronic hip pain (Tertiary Area of Pain) (Bilateral) (R>L) 02/26/2018   DDD (degenerative disc disease), cervical 02/26/2018   Cervical facet arthropathy (Bilateral) 02/26/2018   Cervical facet syndrome (Bilateral) (R>L) 02/26/2018   Cervical Grade 1 Anterolisthesis of C4 over C5 (Degenerative) 02/26/2018   Cervical Grade 1 Retrolisthesis of C5 over C6 02/26/2018   Cervical foraminal stenosis (C3-4, C4-5, and C5-6) (Bilateral) 02/26/2018   DDD (degenerative disc disease), lumbosacral 02/26/2018   Osteoarthritis of lumbar spine 02/26/2018   Osteoarthritis of facet joint of lumbar spine 02/26/2018   Spondylosis without myelopathy or radiculopathy, cervicothoracic region 02/26/2018   Occipital headache (Right) 02/25/2018   Frontal headache (Left) 02/25/2018   Chronic low back pain (Midline) (Secondary  Area of Pain) 02/25/2018   Chronic lumbar radiculopathy (Right) 02/25/2018   Osteoarthritis involving multiple joints 02/25/2018   Osteoarthritis of shoulder (Bilateral) 02/25/2018   Chronic musculoskeletal pain 02/25/2018   Lumbar facet syndrome (Bilateral) (R>L) 02/25/2018   Vitamin D deficiency 02/14/2018   Chronic pain syndrome 02/07/2018   Pharmacologic therapy 02/07/2018   Disorder of skeletal system 02/07/2018   Problems influencing health status 02/07/2018   Long term current use of opiate analgesic 02/07/2018   Chronic low back pain (Secondary Area of Pain) (Bilateral) (R>L) w/ sciatica (Right) 02/07/2018   Chronic lower extremity pain (Tertiary Area of Pain) (Bilateral) (R>L) 02/07/2018   Chronic knee pain (Fourth Area of Pain) (Bilateral) (R>L) 02/07/2018   Chronic shoulder pain (Fifth Area of Pain) (Bilateral) (R>L) 02/07/2018   Occipital neuralgia (Primary Area of Pain) (Bilateral) (R>L) 02/07/2018   Chronic neck pain (Bilateral) (R>L) 02/07/2018   Chronic sacroiliac joint pain (Bilateral) (R>L) 02/07/2018   H/O aneurysm 01/30/2018   History of depression 01/30/2018   History of seizure disorder 01/30/2018   Chronic headache disorder (Primary Area of Pain) 01/30/2018   Chronic migraine without aura 01/17/2018   Chronic respiratory failure with hypoxia (HCC) 01/17/2018   Constipation 01/17/2018   HTN,  goal below 140/90 01/17/2018   Hypercholesterolemia 01/17/2018   Major depression in remission (HCC) 01/17/2018   COPD (chronic obstructive pulmonary disease) (HCC) 01/17/2018   Sarcoidosis 01/17/2018   Seizure disorder (HCC) 01/17/2018   Cerebral aneurysm 01/17/2018   Nonruptured cerebral aneurysm 10/22/2017   Obstructive sleep apnea on CPAP 09/11/2017   Generalized anxiety disorder 01/26/2017   Irritable bowel syndrome 01/26/2017   Pulmonary emphysema (HCC) 01/26/2017   GERD (gastroesophageal reflux disease) 08/02/2016   Hiatal hernia 08/02/2016   Essential tremor  07/19/2016   Muscle spasm 10/05/2015   Spondylosis without myelopathy or radiculopathy, lumbosacral region 10/05/2015   Rotator cuff injury 09/17/2015   Chronic shoulder pain (Right) 09/17/2015   Epilepsy (HCC) 07/14/2015   Insomnia 07/14/2015   Osteoarthritis of knee (Bilateral) (R>L) 06/24/2015    Past Surgical History: Past Surgical History:  Procedure Laterality Date   ABDOMINAL HYSTERECTOMY     BACK SURGERY     spine fusion anterior lumbar with open posterior   BIOPSY  07/02/2023   Procedure: BIOPSY;  Surgeon: Regis Bill, MD;  Location: [facility] ENDOSCOPY;  Service: Endoscopy;;   CHOLECYSTECTOMY     COLONOSCOPY WITH PROPOFOL N/A 08/19/2019   Procedure: COLONOSCOPY WITH PROPOFOL;  Surgeon: Christena Deem, MD;  Location: [facility] ENDOSCOPY;  Service: Endoscopy;  Laterality: N/A;   ESOPHAGOGASTRODUODENOSCOPY (EGD) WITH PROPOFOL N/A 08/19/2019   Procedure: ESOPHAGOGASTRODUODENOSCOPY (EGD) WITH PROPOFOL;  Surgeon: Christena Deem, MD;  Location: [facility] ENDOSCOPY;  Service: Endoscopy;  Laterality: N/A;   ESOPHAGOGASTRODUODENOSCOPY (EGD) WITH PROPOFOL N/A 08/04/2020   Procedure: ESOPHAGOGASTRODUODENOSCOPY (EGD) WITH PROPOFOL;  Surgeon: Earline Mayotte, MD;  Location: [facility] ENDOSCOPY;  Service: Endoscopy;  Laterality: N/A;   ESOPHAGOGASTRODUODENOSCOPY (EGD) WITH PROPOFOL N/A 11/05/2020   Procedure: ESOPHAGOGASTRODUODENOSCOPY (EGD) WITH PROPOFOL;  Surgeon: Earline Mayotte, MD;  Location: [facility] ENDOSCOPY;  Service: Endoscopy;  Laterality: N/A;   ESOPHAGOGASTRODUODENOSCOPY (EGD) WITH PROPOFOL N/A 07/02/2023   Procedure: ESOPHAGOGASTRODUODENOSCOPY (EGD) WITH PROPOFOL;  Surgeon: Regis Bill, MD;  Location: [facility] ENDOSCOPY;  Service: Endoscopy;  Laterality: N/A;   SPINAL FUSION      Allergies: Allergies  Allergen Reactions   Penicillins Rash, Hives and Itching   Contrast Media [Iodinated Contrast Media] Swelling   Gabapentin    Labetalol Other (See Comments)    Made hair fall  out   Sulfabenzamide Nausea Only   Pregabalin Other (See Comments)    The patient complained of hemifacial numbness with the use of the medication.    Home Medications: Medications Prior to Admission  Medication Sig Dispense Refill Last Dose   albuterol (PROVENTIL) (2.5 MG/3ML) 0.083% nebulizer solution Take 2.5 mg by nebulization every 6 (six) hours as needed for wheezing or shortness of breath.   prn at unknown   Albuterol Sulfate 108 (90 Base) MCG/ACT AEPB Inhale 2 puffs into the lungs every 6 (six) hours as needed.   prn at unknown   amLODipine-olmesartan (AZOR) 5-40 MG tablet TAKE 1 TABLET BY MOUTH EVERY MORNING 90 tablet 1 08/30/2023   ARNUITY ELLIPTA 100 MCG/ACT AEPB INHALE 1 INHALATION INTO THE LUNGS ONCE DAILY. 30 each 1 08/30/2023   aspirin EC 81 MG tablet Take 1 tablet (81 mg total) by mouth daily. 30 tablet 0 08/30/2023   atorvastatin (LIPITOR) 80 MG tablet TAKE 1 TABLET BY MOUTH NIGHTLY 30 tablet 0 08/29/2023   calcium carbonate (OS-CAL) 1250 (500 Ca) MG chewable tablet Chew by mouth daily.   prn at unknown   citalopram (CELEXA) 40 MG tablet TAKE ONE TABLET BY  MOUTH EVERY MORNING 30 tablet 1 08/30/2023   diclofenac Sodium (VOLTAREN) 1 % GEL Apply 4 g topically 4 (four) times daily as needed (Painful Joints).   prn at unknown   diphenhydrAMINE (BENADRYL) 25 MG tablet Take 25 mg by mouth every 6 (six) hours as needed.   prn at unknown   donepezil (ARICEPT) 10 MG tablet TAKE ONE TABLET BY MOUTH EVERY MORNING 30 tablet 0 08/30/2023   hydrochlorothiazide (HYDRODIURIL) 25 MG tablet TAKE 1 TABLET BY MOUTH EVERY MORNING 90 tablet 1 08/30/2023   indomethacin (INDOCIN) 50 MG capsule Take 50 mg by mouth 3 (three) times daily.   08/30/2023   lactulose (CHRONULAC) 10 GM/15ML solution Take 10 g by mouth 2 (two) times daily as needed for mild constipation.   prn at unknown   levETIRAcetam (KEPPRA) 750 MG tablet TAKE ONE TABLET BY MOUTH EVERY MORNING 90 tablet 1 08/30/2023   LINZESS 290 MCG CAPS capsule  TAKE 1 CAPSULE BY MOUTH EVERY MORNING 30 capsule 2 08/30/2023   mirtazapine (REMERON) 15 MG tablet TAKE ONE TABLET BY MOUTH AT BEDTIME 30 tablet 0 08/29/2023   nitroGLYCERIN (NITROSTAT) 0.4 MG SL tablet Place 0.4 mg under the tongue every 5 (five) minutes as needed for chest pain.   prn at unknown   omeprazole (PRILOSEC) 20 MG capsule TAKE 1 CAPSULE BY MOUTH ONCE DAILY 30 capsule 3 08/29/2023   Oxycodone HCl 10 MG TABS Take 10 mg by mouth 3 (three) times daily as needed.   08/30/2023   pantoprazole (PROTONIX) 40 MG tablet TAKE ONE TABLET BY MOUTH EVERY MORNING 90 tablet 0 08/30/2023   potassium chloride SA (KLOR-CON M) 20 MEQ tablet TAKE ONE TABLET BY MOUTH EVERY MORNING 90 tablet 1 08/30/2023   sucralfate (CARAFATE) 1 g tablet TAKE ONE TABLET BY MOUTH FOUR TIMES DAILY BEFORE MEALS AND AT BEDTIME 360 tablet 0 prn at unknown   Suvorexant (BELSOMRA) 20 MG TABS TAKE ONE TABLET BY MOUTH AT BEDTIME 30 tablet 0 08/29/2023   clotrimazole-betamethasone (LOTRISONE) cream Apply 1 application topically 2 (two) times daily. (Patient not taking: Reported on 08/30/2023) 30 g 0 Not Taking   dicyclomine (BENTYL) 20 MG tablet Take 1 tablet (20 mg total) by mouth every 6 (six) hours as needed for up to 7 days for spasms. 25 tablet 0    Elastic Bandages & Supports (MEDICAL COMPRESSION STOCKINGS) MISC Please provide compression stockings 1 each 0    omeprazole (PRILOSEC OTC) 20 MG tablet Take 1 tablet (20 mg total) by mouth daily for 28 days. 28 tablet 0    promethazine (PHENERGAN) 25 MG tablet Take 1 tablet (25 mg total) by mouth every 6 (six) hours as needed for nausea or vomiting. 30 tablet 2    tiZANidine (ZANAFLEX) 4 MG tablet Take 1 tablet (4 mg total) by mouth 3 (three) times daily. 90 tablet 5    torsemide (DEMADEX) 20 MG tablet Take 1 tablet (20 mg total) by mouth daily. (Patient not taking: Reported on 08/30/2023) 5 tablet 0 Not Taking   traZODone (DESYREL) 100 MG tablet TAKE 1 TABLET BY MOUTH NIGHTLY (Patient  not taking: Reported on 08/30/2023) 30 tablet 0 Not Taking   Home medication reconciliation was completed with the patient.   Scheduled Inpatient Medications:    enoxaparin (LOVENOX) injection  40 mg Subcutaneous Q24H   ipratropium-albuterol  3 mL Nebulization TID   levETIRAcetam  750 mg Oral q morning   pantoprazole (PROTONIX) IV  40 mg Intravenous Q12H   pneumococcal  20-valent conjugate vaccine  0.5 mL Intramuscular Tomorrow-1000   predniSONE  40 mg Oral Q breakfast    Continuous Inpatient Infusions:    cefTRIAXone (ROCEPHIN)  IV 2 g (08/30/23 1828)   lactated ringers 150 mL/hr (08/30/23 1826)   metronidazole 500 mg (08/31/23 0454)    PRN Inpatient Medications:  albuterol, HYDROmorphone (DILAUDID) injection  Family History: family history includes Breast cancer in her mother; Dementia in her mother; Throat cancer in her father.  The patient's family history is negative for inflammatory bowel disorders, GI malignancy, or solid organ transplantation.  Social History:   reports that she has never smoked. She has never used smokeless tobacco. She reports current alcohol use. She reports that she does not use drugs. The patient denies ETOH, tobacco, or drug use.   Review of Systems: Constitutional: Weight is stable.  Eyes: No changes in vision. ENT: No oral lesions, sore throat.  GI: see HPI.  Heme/Lymph: No easy bruising.  CV: Positive mild left anterior chest pain intermittent.  GU: No hematuria.  Integumentary: No rashes.  Neuro: No headaches.  Psych: No depression/anxiety.  Endocrine: No heat/cold intolerance.  Allergic/Immunologic: No urticaria.  Resp: No cough, worse SOB.  Musculoskeletal: No joint swelling.    Physical Examination: BP 107/68 (BP Location: Right Arm)   Pulse 99   Temp 98.8 F (37.1 C)   Resp 20   Ht 5\' 3"  (1.6 m)   Wt 79.4 kg   SpO2 99%   BMI 31.00 kg/m  Gen: NAD, alert and oriented x 4. Sitting at the side of the bed bathing. Looks stronger  and more well appearing today HEENT: EOMI Neck: supple, no JVD or thyromegaly Chest: CTA bilaterally, no wheezes, crackles, or other adventitious sounds CV: tachycardia  Abd: soft, mild generalized tenderness with light palpation, ND, +BS in all four quadrants; no HSM, guarding, rigidity, or rebound tenderness Ext: no edema Skin: no rash or lesions noted  Data: Lab Results  Component Value Date   WBC 24.8 (H) 08/30/2023   HGB 8.1 (L) 08/30/2023   HCT 26.5 (L) 08/30/2023   MCV 76.4 (L) 08/30/2023   PLT 560 (H) 08/30/2023   Recent Labs  Lab 08/30/23 1131  HGB 8.1*   Lab Results  Component Value Date   NA 136 08/30/2023   K 4.4 08/30/2023   CL 107 08/30/2023   CO2 17 (L) 08/30/2023   BUN 37 (H) 08/30/2023   CREATININE 1.54 (H) 08/30/2023   Lab Results  Component Value Date   ALT 16 08/30/2023   AST 19 08/30/2023   ALKPHOS 123 08/30/2023   BILITOT 0.3 08/30/2023   Recent Labs  Lab 08/30/23 1131  INR 1.1   CLINICAL DATA:  Abdominal pain   EXAM: CT ABDOMEN AND PELVIS WITHOUT CONTRAST   TECHNIQUE: Multidetector CT imaging of the abdomen and pelvis was performed following the standard protocol without IV contrast.   RADIATION DOSE REDUCTION: This exam was performed according to the departmental dose-optimization program which includes automated exposure control, adjustment of the mA and/or kV according to patient size and/or use of iterative reconstruction technique.   COMPARISON:  CT abdomen/pelvis 06/26/2023   FINDINGS: Lower chest: Relative hyperlucency in the right middle lobe as well as traction bronchiectasis and architectural distortion in the right lower lobe are unchanged. The imaged heart is unremarkable.   Hepatobiliary: The liver is unremarkable. The gallbladder is surgically absent. There is no biliary ductal dilatation.   Pancreas: Unremarkable.   Spleen: Unremarkable.  Adrenals/Urinary Tract: The adrenals are unremarkable   The kidneys  are unremarkable, with no focal lesion, stone, hydronephrosis, or hydroureter. The bladder is decompressed but grossly unremarkable.   Stomach/Bowel: The stomach is unremarkable. There is no evidence of bowel obstruction. There is no abnormal bowel wall thickening or inflammatory change. The appendix is normal. There is fluid in the right colon.   Vascular/Lymphatic: There is mild calcified plaque in the nonaneurysmal abdominal aorta. There are few prominent retroperitoneal lymph nodes measuring up to 1.0 cm at the level of the aortic bifurcation, nonspecific but not significantly changed since 2020 and favored reactive. There is no new or progressive abdominopelvic lymphadenopathy.   Reproductive: The uterus and adnexa are unremarkable.   Other: There is no ascites or free air.   Musculoskeletal: There is no acute osseous abnormality or suspicious osseous lesion.   IMPRESSION: Fluid in the right colon may reflect a diarrheal illness. Otherwise, no acute findings in the abdomen or pelvis.     Electronically Signed   By: Lesia Hausen M.D.   On: 08/30/2023 12:22     Assessment/Plan: Ms. Boughner is a 67 y.o. female  with history of sarcoidosis, chronic respiratory failure, supplemental oxygen therapy, COPD, asthma, cerebral aneurysm, chronic migraine, chronic pain syndrome- knees,  constipation, major depression in remission hypercholesterolemia, seizures (HCC) ,sleep apnea,  gastric ulcers presents for  upper abdominal pain that radiates across into the back, nausea, and hypotension with tachycardia, acute on chronic SOB and persistent  leukocytosis. Anemia of chronic disease and iron deficiency.  She also reports diarrhea over the last few weeks. HX of elevated D dimer and inclusive VQ scan.   # Diarrhea: She continues to take Linzess every other day-last dose yesterday.  She takes it with loose stools.  Csy is UTD- normal 2020- due 2025 for PH colon polyps Stool studies neg for  C diff and PCR CT scan IMPRESSION:Fluid in the right colon may reflect a diarrheal illness. Otherwise,no acute findings in the abdomen or pelvis.  #History of non bleeding gastric ulcers- Forrest III class in July Presents on pantoprazole  Avoids NSAIDs NO prednisone at home IV PPI  Scheduled for repeat EGD 10/12/2023  #Sepsis: Met sepsis criteria with heart rate in 100s, hypotension, WBC  25 on admission Leukocytosis chronic- not on outpatient prednisone Low grade fevers 99 range Lactate 1.0 Received IV Rocephin and Flagyl for infectious coverage Blood cultures no growth  at this time x2 Stool studies PCR, Cdiff negative CXR- no infiltrate CT a/p- negative acute findings  #Anemia of chronic disease and iron deficiency: Hgb: 9.6 baseline-8 0.6-8.1 Ferritin 54, iron sat 4, serum iron 10. B12 881 Retic immature 29.7, absolute 20.6 RBC 3.37 No melena or hematochezia No reports of GI blood loss History of non bleeding gastric ulcers- Forrest III class  - COPD - Chronic resp failure - Sarcoidosis Appears stable on 3 L nasal cannula increase work of breathing and patient reports subjective acute on chronic shortness of breath.  Noted extensive upper lobe bilateral scarring on chest x-ray thought to be chronic. Noted abnormal V/Q and D-dimer in June  CT of the chest has been ordered by Pulmonary   Recommendations: Advised she should stop taking laxatives when she has diarrhea Repeat EGD planned as outpatient for follow-up non bleeding gastric ulcers. Consider outpatient colonoscopy for IDA  No GI source of infection identified Continue PPI and may switch orally  Avoid NSAIDs Monitor for GI bleed Consider Hematology consult for persistent leukocytosis and outpatient  IV iron transfusion Labs and imaging reviewed Supportive care per primary team Pulmonary workup in process   Thank you for the consult. Please call with questions or concerns.  Amedeo Kinsman, ANP [facility] [phone]

## 2023-08-31 NOTE — Progress Notes (Signed)
PT Cancellation Note  Patient Details Name: Jean Gomez MRN: [id number] DOB: 10-09-1956   Cancelled Treatment:    Reason Eval/Treat Not Completed: Pain limiting ability to participate.  PT consult received.  Chart reviewed.  Pt c/o 10/10 migraine pain and also LBP; pt declining PT session at this time d/t this pain--nurse notified of pt's pain and request for pain medication.  Hendricks Limes, PT 08/31/23, 2:25 PM

## 2023-09-01 DIAGNOSIS — R109 Unspecified abdominal pain: Secondary | ICD-10-CM | POA: Diagnosis not present

## 2023-09-01 DIAGNOSIS — N179 Acute kidney failure, unspecified: Secondary | ICD-10-CM | POA: Diagnosis not present

## 2023-09-01 DIAGNOSIS — D72829 Elevated white blood cell count, unspecified: Secondary | ICD-10-CM | POA: Diagnosis not present

## 2023-09-01 LAB — CBC WITH DIFFERENTIAL/PLATELET
Abs Immature Granulocytes: 0.74 10*3/uL — ABNORMAL HIGH (ref 0.00–0.07)
Basophils Absolute: 0 10*3/uL (ref 0.0–0.1)
Basophils Relative: 0 %
Eosinophils Absolute: 0 10*3/uL (ref 0.0–0.5)
Eosinophils Relative: 0 %
HCT: 22.1 % — ABNORMAL LOW (ref 36.0–46.0)
Hemoglobin: 7 g/dL — ABNORMAL LOW (ref 12.0–15.0)
Immature Granulocytes: 3 %
Lymphocytes Relative: 7 %
Lymphs Abs: 1.5 10*3/uL (ref 0.7–4.0)
MCH: 23.6 pg — ABNORMAL LOW (ref 26.0–34.0)
MCHC: 31.7 g/dL (ref 30.0–36.0)
MCV: 74.7 fL — ABNORMAL LOW (ref 80.0–100.0)
Monocytes Absolute: 0.8 10*3/uL (ref 0.1–1.0)
Monocytes Relative: 4 %
Neutro Abs: 19.5 10*3/uL — ABNORMAL HIGH (ref 1.7–7.7)
Neutrophils Relative %: 86 %
Platelets: 497 10*3/uL — ABNORMAL HIGH (ref 150–400)
RBC: 2.96 MIL/uL — ABNORMAL LOW (ref 3.87–5.11)
RDW: 18.8 % — ABNORMAL HIGH (ref 11.5–15.5)
WBC: 22.6 10*3/uL — ABNORMAL HIGH (ref 4.0–10.5)
nRBC: 0.1 % (ref 0.0–0.2)

## 2023-09-01 LAB — ABO/RH: ABO/RH(D): A POS

## 2023-09-01 LAB — VITAMIN B12: Vitamin B-12: 897 pg/mL (ref 180–914)

## 2023-09-01 LAB — PREPARE RBC (CROSSMATCH)

## 2023-09-01 LAB — MAGNESIUM: Magnesium: 1.7 mg/dL (ref 1.7–2.4)

## 2023-09-01 LAB — PHOSPHORUS: Phosphorus: 2.6 mg/dL (ref 2.5–4.6)

## 2023-09-01 LAB — VITAMIN D 25 HYDROXY (VIT D DEFICIENCY, FRACTURES): Vit D, 25-Hydroxy: 44.5 ng/mL (ref 30–100)

## 2023-09-01 MED ORDER — PANTOPRAZOLE SODIUM 40 MG PO TBEC
40.0000 mg | DELAYED_RELEASE_TABLET | Freq: Two times a day (BID) | ORAL | Status: DC
  Administered 2023-09-01 – 2023-09-06 (×11): 40 mg via ORAL
  Filled 2023-09-01 (×11): qty 1

## 2023-09-01 MED ORDER — SODIUM CHLORIDE 0.9% IV SOLUTION: Freq: Once | INTRAVENOUS | Status: AC

## 2023-09-01 NOTE — TOC Initial Note (Signed)
Transition of Care [facility]) - Initial/Assessment Note    Patient Details  Name: Jean Gomez MRN: [id number] Date of Birth: May 03, 1956  Transition of Care [facility]) CM/SW Contact:    Allena Katz, LCSW Phone Number: 09/01/2023, 10:01 AM  Clinical Narrative:     Pt admitted from home with Sepsis. Pt lives with adult children.  Pt declining to work with PT at this time due to ongoing pain. CSW will follow up with patient for post discharge needs once PT/OT has been able to work with patient.             Expected Discharge Plan: Home w Home Health Services Barriers to Discharge: Continued Medical Work up   Patient Goals and CMS Choice            Expected Discharge Plan and Services       Living arrangements for the past 2 months: Single Family Home                                      Prior Living Arrangements/Services Living arrangements for the past 2 months: Single Family Home Lives with:: Adult Children Patient language and need for interpreter reviewed:: No Do you feel safe going back to the place where you live?: Yes        Care giver support system in place?: Yes (comment)      Activities of Daily Living Home Assistive Devices/Equipment: Cane (specify quad or straight) ADL Screening (condition at time of admission) Patient's cognitive ability adequate to safely complete daily activities?: Yes Is the patient deaf or have difficulty hearing?: No Does the patient have difficulty seeing, even when wearing glasses/contacts?: No Does the patient have difficulty concentrating, remembering, or making decisions?: No Patient able to express need for assistance with ADLs?: Yes Does the patient have difficulty dressing or bathing?: No Independently performs ADLs?: Yes (appropriate for developmental age) Does the patient have difficulty walking or climbing stairs?: Yes Weakness of Legs: Both Weakness of Arms/Hands: None  Permission Sought/Granted                   Emotional Assessment       Orientation: : Oriented to Self, Oriented to Place, Oriented to  Time, Oriented to Situation      Admission diagnosis:  Diarrhea [R19.7] AKI (acute kidney injury) (HCC) [N17.9] Abdominal pain, unspecified abdominal location [R10.9] Leukocytosis, unspecified type [D72.829] Patient Active Problem List   Diagnosis Date Noted   Diarrhea 08/30/2023   Sepsis (HCC) 08/30/2023   Multiple gastric ulcers 08/30/2023   AKI (acute kidney injury) (HCC) 08/30/2023   Anemia of chronic disease 08/30/2023   Other spondylosis, sacral and sacrococcygeal region 07/01/2020   History of Allergy to iodine 01/27/2020    Class: History of   Lumbosacral spondylosis with radiculopathy (Right) 01/05/2020   Neurogenic pain 10/29/2019   Osteoarthritis of hip (Right) 10/09/2019   Greater trochanteric bursitis (Right) 10/09/2019   Lumbosacral spinal stenosis 10/09/2019   Lumbosacral (intervertebral disc displacement) (IVDD) 10/09/2019   Lumbar nerve root impingement (Left: L4) (Right: L5) 10/09/2019   Grade 1 Anterolisthesis of L5/S1 10/09/2019   Lumbar facet arthropathy (L3-4, L4-5, L5-S1) 10/09/2019   Lumbosacral facet joint synovial cyst (Right: L5-S1) 10/09/2019   Lumbar foraminal stenosis (Bilateral: L2-S1) 07/10/2019   Abnormal MRI, lumbar spine (07/02/2019) 07/03/2019   Disturbance of skin sensation 06/16/2019   Chronic low back pain (Secondary Area  of Pain) (Bilateral) (R>L) w/o sciatica 01/16/2019   Acute on chronic respiratory failure with hypoxia (HCC) 12/18/2018   Cervicogenic headache 10/21/2018   Lumbar spondylosis 06/25/2018   History of allergy to radiographic contrast media 05/30/2018   Bipolar affective disorder, current episode manic (HCC) 04/17/2018   Chronic hip pain (Tertiary Area of Pain) (Bilateral) (R>L) 02/26/2018   DDD (degenerative disc disease), cervical 02/26/2018   Cervical facet arthropathy (Bilateral) 02/26/2018   Cervical facet  syndrome (Bilateral) (R>L) 02/26/2018   Cervical Grade 1 Anterolisthesis of C4 over C5 (Degenerative) 02/26/2018   Cervical Grade 1 Retrolisthesis of C5 over C6 02/26/2018   Cervical foraminal stenosis (C3-4, C4-5, and C5-6) (Bilateral) 02/26/2018   DDD (degenerative disc disease), lumbosacral 02/26/2018   Osteoarthritis of lumbar spine 02/26/2018   Osteoarthritis of facet joint of lumbar spine 02/26/2018   Spondylosis without myelopathy or radiculopathy, cervicothoracic region 02/26/2018   Occipital headache (Right) 02/25/2018   Frontal headache (Left) 02/25/2018   Chronic low back pain (Midline) (Secondary Area of Pain) 02/25/2018   Chronic lumbar radiculopathy (Right) 02/25/2018   Osteoarthritis involving multiple joints 02/25/2018   Osteoarthritis of shoulder (Bilateral) 02/25/2018   Chronic musculoskeletal pain 02/25/2018   Lumbar facet syndrome (Bilateral) (R>L) 02/25/2018   Vitamin D deficiency 02/14/2018   Chronic pain syndrome 02/07/2018   Pharmacologic therapy 02/07/2018   Disorder of skeletal system 02/07/2018   Problems influencing health status 02/07/2018   Long term current use of opiate analgesic 02/07/2018   Chronic low back pain (Secondary Area of Pain) (Bilateral) (R>L) w/ sciatica (Right) 02/07/2018   Chronic lower extremity pain (Tertiary Area of Pain) (Bilateral) (R>L) 02/07/2018   Chronic knee pain (Fourth Area of Pain) (Bilateral) (R>L) 02/07/2018   Chronic shoulder pain (Fifth Area of Pain) (Bilateral) (R>L) 02/07/2018   Occipital neuralgia (Primary Area of Pain) (Bilateral) (R>L) 02/07/2018   Chronic neck pain (Bilateral) (R>L) 02/07/2018   Chronic sacroiliac joint pain (Bilateral) (R>L) 02/07/2018   H/O aneurysm 01/30/2018   History of depression 01/30/2018   History of seizure disorder 01/30/2018   Chronic headache disorder (Primary Area of Pain) 01/30/2018   Chronic migraine without aura 01/17/2018   Chronic respiratory failure with hypoxia (HCC)  01/17/2018   Constipation 01/17/2018   HTN, goal below 140/90 01/17/2018   Hypercholesterolemia 01/17/2018   Major depression in remission (HCC) 01/17/2018   COPD (chronic obstructive pulmonary disease) (HCC) 01/17/2018   Sarcoidosis 01/17/2018   Seizure disorder (HCC) 01/17/2018   Cerebral aneurysm 01/17/2018   Nonruptured cerebral aneurysm 10/22/2017   Obstructive sleep apnea on CPAP 09/11/2017   Generalized anxiety disorder 01/26/2017   Irritable bowel syndrome 01/26/2017   Pulmonary emphysema (HCC) 01/26/2017   GERD (gastroesophageal reflux disease) 08/02/2016   Hiatal hernia 08/02/2016   Essential tremor 07/19/2016   Muscle spasm 10/05/2015   Spondylosis without myelopathy or radiculopathy, lumbosacral region 10/05/2015   Rotator cuff injury 09/17/2015   Chronic shoulder pain (Right) 09/17/2015   Epilepsy (HCC) 07/14/2015   Insomnia 07/14/2015   Osteoarthritis of knee (Bilateral) (R>L) 06/24/2015   PCP:  Sherron Monday, MD Pharmacy:   [facility] - [place], [place] - [address] [place] [address] Phone: [phone] Fax: [phone]     Social Determinants of Health (SDOH) Social History: SDOH Screenings   Food Insecurity: No Food Insecurity (08/30/2023)  Housing: Low Risk  (08/30/2023)  Transportation Needs: No Transportation Needs (08/30/2023)  Utilities: Not At Risk (08/30/2023)  Alcohol Screen: Low Risk  (04/17/2018)  Depression (PHQ2-9): Low Risk  (08/26/2020)  Financial Resource Strain: Low Risk  (03/05/2018)  Physical Activity: Unknown (03/05/2018)  Social Connections: Unknown (03/05/2018)  Stress: No Stress Concern Present (03/05/2018)  Tobacco Use: Low Risk  (07/02/2023)   SDOH Interventions:     Readmission Risk Interventions     No data to display

## 2023-09-01 NOTE — Evaluation (Signed)
Physical Therapy Evaluation Patient Details Name: Jean Gomez MRN: [id number] DOB: March 13, 1956 Today's Date: 09/01/2023  History of Present Illness  67 y.o. female with medical history significant of sarcoidosis, chronic pain, chronic respiratory failure on 2 L, depression, hyperlipidemia, seizure disorder presenting with sepsis, diarrhea.  Patient reports several weeks to months of recurring nonbilious nonbloody diarrhea.  Symptoms have progressively worsened especially over the past few weeks.  No fevers or chills.  Positive nausea mild generalized abdominal pain.  No chest pain.  Does have some shortness of breath and wheezing over the past week or so in setting of COPD.  Non-smoker.  Baseline sarcoidosis with chronic respiratory failure on 3 L.  Stable home oxygen use.  Has been using inhalers with minimal improvement in symptoms.  No reported rhinorrhea and nasal congestion.  Clinical Impression  Pt eating, so I come back, then she done. Pt agreeable to evaluation of mobility, still has headache. Pt demonstrates bed mobility, transfers, AMB in room ~1103ft without equipment or physical assistance. She appears slow, weak, unsure of balance, but well compensated and careful. Pt returned to O2 at end of session, all needs met. Pt appears to be very near baseline per both her report and today's performance. See cognition notes below.   Regarding cognition:  Pt has some questionable cognitive impairment in discussion- says she has been AMB to BR today, but cannot explain how she AMB to BR and maintained O2 on with a 93ft [place] attached to wall. Additional questioning only makes matters more convoluted, but it sounds as though pt took the wall O2 with her into the BR. She also automatically doffs her canula prior to coming to EOB, then upon commencing AMB, asks author if he plans to take off her O2 prior to AMB, then says, oh you already took it off- Thereasa Parkin corrects pt to remind her that she took it off  herself 30 sec prior. On room air, 89% SpO2 120 bpm, lightheaded but sats improving. Pt returned to O2 at end of session, all needs met. Pt appears to be very near baseline per both her report and today's performance.        If plan is discharge home, recommend the following: A little help with walking and/or transfers;A little help with bathing/dressing/bathroom;Assist for transportation;Help with stairs or ramp for entrance   Can travel by private vehicle        Equipment Recommendations None recommended by PT  Recommendations for Other Services       Functional Status Assessment Patient has had a recent decline in their functional status and demonstrates the ability to make significant improvements in function in a reasonable and predictable amount of time.     Precautions / Restrictions Precautions Precautions: Fall      Mobility  Bed Mobility Overal bed mobility: Modified Independent                  Transfers Overall transfer level: Needs assistance Equipment used: None Transfers: Sit to/from Stand Sit to Stand: Supervision                Ambulation/Gait Ambulation/Gait assistance: Contact guard assist Gait Distance (Feet): 100 Feet Assistive device: None Gait Pattern/deviations: Step-to pattern       General Gait Details: slow, steady, takes her time, has some increased HR/lightheadedness after AMB (SpO2 89% on room air)  Stairs            Wheelchair Mobility     Tilt Bed  Modified Rankin (Stroke Patients Only)       Balance                                             Pertinent Vitals/Pain Pain Assessment Pain Assessment: Faces Faces Pain Scale: Hurts a little bit Pain Location: headache/migraine Pain Intervention(s): Limited activity within patient's tolerance    Home Living Family/patient expects to be discharged to:: Private residence Living Arrangements: Alone Available Help at Discharge:  Family;Available PRN/intermittently Type of Home: Apartment Home Access: Level entry       Home Layout: One level Home Equipment: Cane - single point;Shower seat;Rollator (4 wheels)      Prior Function Prior Level of Function : Independent/Modified Independent;Driving             Mobility Comments: SPC or less for AMB in home. ADLs Comments: . She is Ind in self care tasks and most IADLs and still drives. Daughter comes to check on her at night and she goes to stay with daugher on the weekends.     Extremity/Trunk Assessment                Communication      Cognition Arousal: Alert Behavior During Therapy: WFL for tasks assessed/performed Overall Cognitive Status: Difficult to assess                                 General Comments: does not answer questions in a helpful way, seems confused or hard of hearing or nonsensical at times        General Comments      Exercises     Assessment/Plan    PT Assessment Patient needs continued PT services  PT Problem List Decreased activity tolerance;Decreased balance;Decreased mobility;Decreased knowledge of use of DME;Decreased safety awareness;Decreased knowledge of precautions       PT Treatment Interventions DME instruction;Patient/family education;Functional mobility training;Therapeutic activities;Therapeutic exercise;Balance training    PT Goals (Current goals can be found in the Care Plan section)  Acute Rehab PT Goals Patient Stated Goal: DC to home PT Goal Formulation: With patient Time For Goal Achievement: 09/15/23 Potential to Achieve Goals: Good    Frequency Min 1X/week     Co-evaluation               AM-PAC PT "6 Clicks" Mobility  Outcome Measure Help needed turning from your back to your side while in a flat bed without using bedrails?: A Little Help needed moving from lying on your back to sitting on the side of a flat bed without using bedrails?: A Little Help needed  moving to and from a bed to a chair (including a wheelchair)?: A Little Help needed standing up from a chair using your arms (e.g., wheelchair or bedside chair)?: A Little Help needed to walk in hospital room?: A Little Help needed climbing 3-5 steps with a railing? : A Little 6 Click Score: 18    End of Session Equipment Utilized During Treatment: Oxygen Activity Tolerance: Patient tolerated treatment well;No increased pain Patient left: in bed;with call bell/phone within reach;with bed alarm set   PT Visit Diagnosis: Unsteadiness on feet (R26.81);Difficulty in walking, not elsewhere classified (R26.2)    Time: 1005-1020 PT Time Calculation (min) (ACUTE ONLY): 15 min   Charges:   PT Evaluation $PT Eval  Moderate Complexity: 1 Mod   PT General Charges $$ ACUTE PT VISIT: 1 Visit        4:24 PM, 09/01/23 Rosamaria Lints, PT, DPT Physical Therapist - [facility]  [phone] (ASCOM)    Jean Gomez 09/01/2023, 4:17 PM

## 2023-09-01 NOTE — Plan of Care (Signed)

## 2023-09-01 NOTE — Progress Notes (Signed)
Progress Note   Patient: Jean Gomez:[id number] DOB: [birth year] DOA: 08/30/2023     2 DOS: the patient was seen and examined on 09/01/2023       Subjective:  Patient seen and examined at bedside this morning She tells me her diarrhea is improving She denies any melena or bloody stool or hematemesis Hemoglobin noted to be 7.0 today I have counseled patient and she has agreed for transfusion of 1 unit of packed RBC today   Brief hospital course: From HPI "Helaina F Gornick is a 67 y.o. female with medical history significant of sarcoidosis, chronic pain, chronic respiratory failure on 2 L, depression, hyperlipidemia, seizure disorder presenting with sepsis, diarrhea.  Patient reports several weeks to months of recurring nonbilious nonbloody diarrhea.  Symptoms have progressively worsened especially over the past few weeks.  No fevers or chills.  Positive nausea mild generalized abdominal pain.  No chest pain.  Does have some shortness of breath and wheezing over the past week or so in setting of COPD.  Non-smoker.  Baseline sarcoidosis with chronic respiratory failure on 3 L.  Stable home oxygen use.  Has been using inhalers with minimal improvement in symptoms.  No reported rhinorrhea and nasal congestion. Presented to the ER Tmax nine 9.1, heart rate 100s, systolic blood pressure initially in the 80s however improved to the 120s with IV fluid resuscitation.  O2 sats stable at 100% on 3 L.  White count 24.8, hemoglobin 8.1, platelets 560.  Lactate 1.  Creatinine 1.54.  CT abdomen pelvis with fluid in right colon which may be indicative of acute diarrheal illness.  "   Assessment and Plan:     Sepsis secondary to acute gastroenteritis  Recurring episodes of nonbilious nonbloody diarrhea over several weeks has progressively worsened per the patient Noted EGD in July of this year with Dr. Mia Creek with finding of gastric ulcers Patient presented with WBC 24 with a pulse of 123 in the  setting of acute gastroenteritis Continue IV Rocephin and Flagyl for possible infectious coverage Plan of care discussed with gastroenterology with Dr. Timothy Lasso f C. difficile test is negative I have discussed the case with gastroenterologist who does not recommend any acute endoscopic intervention at this time GI panel reviewed by me shows no suspicious organisms We will continue current IV fluid   Sepsis (HCC) Meeting sepsis criteria with heart rate in 100s as well as white count of 25 Had transient systolic blood pressure in the 80s so would technically be within the severe sepsis criteria though overall reassuring presentation Lactate 1 Noted likely predominant GI source in the setting of persistent diarrhea CT of the abdomen pelvis with fluid in the right colon Continue IV Rocephin and Flagyl for infectious coverage Follow-up on pancultured   COPD (chronic obstructive pulmonary disease) (HCC) Chronic hypoxic respiratory failure Positive exacerbation with increased work of breathing, cough or wheezing over multiple days Continue prednisone to complete 5 days course Continue DuoNebs IV Rocephin in the setting of sepsis associated with persistent diarrhea     Acute on chronic anemia secondary to anemia of chronic disease Hemoglobin 8  with baseline hemoglobin around 8-10 in setting of chronic medical issues including sarcoidosis and chronic respiratory failure No reported active bleeding Will monitor for now We will give 1 unit of packed RBC today Monitor CBC closely GI on board we appreciate input   AKI (acute kidney injury) (HCC) Creatinine 1.54 with GFR in the 30s Clinically dry on presentation IV fluid hydration Monitor  renal function By nephrotoxic medications   Multiple gastric ulcers Noted gastric ulcers on EGD July 2023 with Dr. Mia Creek Schedule for repeat EGD October 25 IV PPI for now in the setting of IV steroid use associated with COPD exacerbation Otherwise  monitor closely   Sarcoidosis Baseline sarcoidosis with pulmonary manifestations Noted active COPD flare with IV steroid use Monitor   Hypercholesterolemia Continue statin   History of seizure disorder Stable at present Continue Keppra   HTN, goal below 140/90 BP stable Titrate home regimen        Advance Care Planning:   Code Status: Prior    Consults: Gastroenterology    Family Communication: No family at the bedside          Physical Exam: HENT: Normocephalic atraumatic Eyes:     Extraocular Movements: Extraocular movements intact.  Cardiovascular:     Rate and Rhythm: Normal rate and regular rhythm.  Pulmonary: Clear to auscultation bilaterally.  Abdominal:     General: Bowel sounds are normal.  Musculoskeletal:        General: Normal range of motion.  Skin:    General: Skin is warm and dry.  Neurological:     General: No focal deficit present.  Psychiatric:        Mood and Affect: Mood normal.       Data Reviewed: I have reviewed patient's lab results including GI panel, C. difficile test, CBC and BMP results as well as gastroenterology documentation    Latest Ref Rng & Units 09/01/2023    5:17 AM 08/31/2023   11:23 AM 08/30/2023   11:31 AM  CBC  WBC 4.0 - 10.5 K/uL 22.6  25.9  24.8   Hemoglobin 12.0 - 15.0 g/dL 7.0  7.9  8.1   Hematocrit 36.0 - 46.0 % 22.1  26.4  26.5   Platelets 150 - 400 K/uL 497  560  560      Vitals:   09/01/23 1414 09/01/23 1415 09/01/23 1444 09/01/23 1457  BP: 125/84 125/84 128/79   Pulse: 96 96 95   Resp: 18 18 18    Temp: 99.7 F (37.6 C) 99.7 F (37.6 C) 99.6 F (37.6 C)   TempSrc:  Oral Oral   SpO2:  100% 100% 99%  Weight:      Height:         Author: Loyce Dys, MD 09/01/2023 3:34 PM  For on call review [email or web address].

## 2023-09-02 DIAGNOSIS — R109 Unspecified abdominal pain: Secondary | ICD-10-CM | POA: Diagnosis not present

## 2023-09-02 DIAGNOSIS — N179 Acute kidney failure, unspecified: Secondary | ICD-10-CM | POA: Diagnosis not present

## 2023-09-02 DIAGNOSIS — D72829 Elevated white blood cell count, unspecified: Secondary | ICD-10-CM | POA: Diagnosis not present

## 2023-09-02 LAB — CBC WITH DIFFERENTIAL/PLATELET
Abs Immature Granulocytes: 0.77 10*3/uL — ABNORMAL HIGH (ref 0.00–0.07)
Basophils Absolute: 0.1 10*3/uL (ref 0.0–0.1)
Basophils Relative: 0 %
Eosinophils Absolute: 0 10*3/uL (ref 0.0–0.5)
Eosinophils Relative: 0 %
HCT: 33.3 % — ABNORMAL LOW (ref 36.0–46.0)
Hemoglobin: 10.2 g/dL — ABNORMAL LOW (ref 12.0–15.0)
Immature Granulocytes: 3 %
Lymphocytes Relative: 12 %
Lymphs Abs: 2.9 10*3/uL (ref 0.7–4.0)
MCH: 24.2 pg — ABNORMAL LOW (ref 26.0–34.0)
MCHC: 30.6 g/dL (ref 30.0–36.0)
MCV: 78.9 fL — ABNORMAL LOW (ref 80.0–100.0)
Monocytes Absolute: 1.4 10*3/uL — ABNORMAL HIGH (ref 0.1–1.0)
Monocytes Relative: 6 %
Neutro Abs: 18.2 10*3/uL — ABNORMAL HIGH (ref 1.7–7.7)
Neutrophils Relative %: 79 %
Platelets: 543 10*3/uL — ABNORMAL HIGH (ref 150–400)
RBC: 4.22 MIL/uL (ref 3.87–5.11)
RDW: 19.5 % — ABNORMAL HIGH (ref 11.5–15.5)
WBC: 23.4 10*3/uL — ABNORMAL HIGH (ref 4.0–10.5)
nRBC: 0.1 % (ref 0.0–0.2)

## 2023-09-02 LAB — PHOSPHORUS: Phosphorus: 2.4 mg/dL — ABNORMAL LOW (ref 2.5–4.6)

## 2023-09-02 LAB — BPAM RBC
Blood Product Expiration Date: 202410072359
ISSUE DATE / TIME: 202409141423
Unit Type and Rh: 6200

## 2023-09-02 LAB — TYPE AND SCREEN
ABO/RH(D): A POS
Antibody Screen: NEGATIVE
Unit division: 0

## 2023-09-02 LAB — BASIC METABOLIC PANEL
Anion gap: 11 (ref 5–15)
BUN: 9 mg/dL (ref 8–23)
CO2: 24 mmol/L (ref 22–32)
Calcium: 8.9 mg/dL (ref 8.9–10.3)
Chloride: 103 mmol/L (ref 98–111)
Creatinine, Ser: 0.65 mg/dL (ref 0.44–1.00)
GFR, Estimated: >60 mL/min (ref >60)
Glucose, Bld: 96 mg/dL (ref 70–99)
Potassium: 3.6 mmol/L (ref 3.5–5.1)
Sodium: 138 mmol/L (ref 135–145)

## 2023-09-02 LAB — MAGNESIUM: Magnesium: 1.9 mg/dL (ref 1.7–2.4)

## 2023-09-02 NOTE — Progress Notes (Signed)
Progress Note   Patient: Jean Gomez WUJ:[id number] DOB: [birth year] DOA: 08/30/2023     3 DOS: the patient was seen and examined on 09/02/2023      Subjective:  Patient seen and examined at bedside this morning Diarrhea is resolved Patient has had some improvement however she tells me she is still not feeling like her baseline Hemoglobin have improved after blood transfusion She denies nausea vomiting abdominal pain chest pain or cough   Brief hospital course: From HPI "Loria F Mackiewicz is a 67 y.o. female with medical history significant of sarcoidosis, chronic pain, chronic respiratory failure on 2 L, depression, hyperlipidemia, seizure disorder presenting with sepsis, diarrhea.  Patient reports several weeks to months of recurring nonbilious nonbloody diarrhea.  Symptoms have progressively worsened especially over the past few weeks.  No fevers or chills.  Positive nausea mild generalized abdominal pain.  No chest pain.  Does have some shortness of breath and wheezing over the past week or so in setting of COPD.  Non-smoker.  Baseline sarcoidosis with chronic respiratory failure on 3 L.  Stable home oxygen use.  Has been using inhalers with minimal improvement in symptoms.  No reported rhinorrhea and nasal congestion. Presented to the ER Tmax nine 9.1, heart rate 100s, systolic blood pressure initially in the 80s however improved to the 120s with IV fluid resuscitation.  O2 sats stable at 100% on 3 L.  White count 24.8, hemoglobin 8.1, platelets 560.  Lactate 1.  Creatinine 1.54.  CT abdomen pelvis with fluid in right colon which may be indicative of acute diarrheal illness.  "   Assessment and Plan:     Sepsis secondary to acute gastroenteritis  Recurring episodes of nonbilious nonbloody diarrhea over several weeks has progressively worsened per the patient Noted EGD in July of this year with Dr. Mia Creek with finding of gastric ulcers Patient presented with WBC 24 with a pulse of  123 in the setting of acute gastroenteritis Continue IV Rocephin and Flagyl for possible infectious coverage Plan of care discussed with gastroenterology with Dr. Timothy Lasso f C. difficile test is negative I have discussed the case with gastroenterologist who does not recommend any acute endoscopic intervention at this time GI panel reviewed by me shows no suspicious organisms Will continue supplemental fluid   Sepsis (HCC) Meeting sepsis criteria with heart rate in 100s as well as white count of 25 Had transient systolic blood pressure in the 80s so would technically be within the severe sepsis criteria though overall reassuring presentation Lactate 1 Noted likely predominant GI source in the setting of persistent diarrhea CT of the abdomen pelvis with fluid in the right colon Continue IV Rocephin and Flagyl for infectious coverage Culture results showing no growth till date   COPD (chronic obstructive pulmonary disease) (HCC) Chronic hypoxic respiratory failure Positive exacerbation with increased work of breathing, cough or wheezing over multiple days Continue prednisone to complete 5 days course Continue DuoNebs IV Rocephin in the setting of sepsis associated with persistent diarrhea     Acute on chronic anemia secondary to anemia of chronic disease Hemoglobin 8  with baseline hemoglobin around 8-10 in setting of chronic medical issues including sarcoidosis and chronic respiratory failure No reported active bleeding Patient received 1 unit of blood transfusion on 09/01/2023 Monitor CBC closely Globin have improved appropriately   AKI (acute kidney injury) (HCC) Creatinine 1.54 with GFR in the 30s Clinically dry on presentation IV fluid hydration Monitor renal function By nephrotoxic medications   Multiple  gastric ulcers Noted gastric ulcers on EGD July 2023 with Dr. Mia Creek Schedule for repeat EGD October 25 IV PPI for now in the setting of IV steroid use associated with COPD  exacerbation Otherwise monitor closely   Sarcoidosis Baseline sarcoidosis with pulmonary manifestations Noted active COPD flare with IV steroid use Monitor   Hypercholesterolemia Continue statin   History of seizure disorder Stable at present Continue Keppra   HTN, goal below 140/90 BP stable Titrate home regimen        Advance Care Planning:   Code Status: Prior    Consults: Gastroenterology    Family Communication: No family at the bedside          Physical Exam: HENT: Normocephalic atraumatic Eyes:     Extraocular Movements: Extraocular movements intact.  Cardiovascular:     Rate and Rhythm: Normal rate and regular rhythm.  Pulmonary: Clear to auscultation bilaterally.  Abdominal:     General: Bowel sounds are normal.  Musculoskeletal:        General: Normal range of motion.  Skin:    General: Skin is warm and dry.  Neurological:     General: No focal deficit present.  Psychiatric:        Mood and Affect: Mood normal.        Data Reviewed: I have reviewed patient's CBC, BMP, PT OT documentation as well as patient's vitals below     Latest Ref Rng & Units 09/02/2023   11:17 AM 09/01/2023    5:17 AM 08/31/2023   11:23 AM  CBC  WBC 4.0 - 10.5 K/uL 23.4  22.6  25.9   Hemoglobin 12.0 - 15.0 g/dL 66.4  7.0  7.9   Hematocrit 36.0 - 46.0 % 33.3  22.1  26.4   Platelets 150 - 400 K/uL 543  497  560        Latest Ref Rng & Units 09/02/2023   11:17 AM 08/31/2023   11:23 AM 08/30/2023   11:31 AM  BMP  Glucose 70 - 99 mg/dL 96  403  474   BUN 8 - 23 mg/dL 9  15  37   Creatinine 0.44 - 1.00 mg/dL 2.59  5.63  8.75   Sodium 135 - 145 mmol/L 138  140  136   Potassium 3.5 - 5.1 mmol/L 3.6  3.7  4.4   Chloride 98 - 111 mmol/L 103  109  107   CO2 22 - 32 mmol/L 24  19  17    Calcium 8.9 - 10.3 mg/dL 8.9  8.5  8.8     Vitals:   09/02/23 0504 09/02/23 0825 09/02/23 0900 09/02/23 1455  BP:  137/75  (!) 127/91  Pulse:  (!) 118 99 (!) 101  Resp:  18  18  Temp:   99.1 F (37.3 C)  99.4 F (37.4 C)  TempSrc:      SpO2:  100%  96%  Weight: 81.7 kg     Height:        Author: Loyce Dys, MD 09/02/2023 3:39 PM  For on call review [email or web address].

## 2023-09-02 NOTE — Plan of Care (Signed)
  Problem: Clinical Measurements: Goal: Diagnostic test results will improve Outcome: Not Progressing   Problem: Fluid Volume: Goal: Hemodynamic stability will improve Outcome: Not Progressing   Problem: Elimination: Goal: Will not experience complications related to bowel motility Outcome: Not Progressing

## 2023-09-02 NOTE — Plan of Care (Signed)

## 2023-09-03 DIAGNOSIS — D72829 Elevated white blood cell count, unspecified: Secondary | ICD-10-CM | POA: Diagnosis not present

## 2023-09-03 DIAGNOSIS — R109 Unspecified abdominal pain: Secondary | ICD-10-CM | POA: Diagnosis not present

## 2023-09-03 DIAGNOSIS — N179 Acute kidney failure, unspecified: Secondary | ICD-10-CM | POA: Diagnosis not present

## 2023-09-03 LAB — BASIC METABOLIC PANEL
Anion gap: 9 (ref 5–15)
BUN: 7 mg/dL — ABNORMAL LOW (ref 8–23)
CO2: 26 mmol/L (ref 22–32)
Calcium: 8.6 mg/dL — ABNORMAL LOW (ref 8.9–10.3)
Chloride: 104 mmol/L (ref 98–111)
Creatinine, Ser: 0.61 mg/dL (ref 0.44–1.00)
GFR, Estimated: >60 mL/min (ref >60)
Glucose, Bld: 83 mg/dL (ref 70–99)
Potassium: 3.3 mmol/L — ABNORMAL LOW (ref 3.5–5.1)
Sodium: 139 mmol/L (ref 135–145)

## 2023-09-03 LAB — CBC WITH DIFFERENTIAL/PLATELET
Abs Immature Granulocytes: 0.68 10*3/uL — ABNORMAL HIGH (ref 0.00–0.07)
Basophils Absolute: 0 10*3/uL (ref 0.0–0.1)
Basophils Relative: 0 %
Eosinophils Absolute: 0 10*3/uL (ref 0.0–0.5)
Eosinophils Relative: 0 %
HCT: 29.8 % — ABNORMAL LOW (ref 36.0–46.0)
Hemoglobin: 9.5 g/dL — ABNORMAL LOW (ref 12.0–15.0)
Immature Granulocytes: 4 %
Lymphocytes Relative: 15 %
Lymphs Abs: 2.4 10*3/uL (ref 0.7–4.0)
MCH: 24.7 pg — ABNORMAL LOW (ref 26.0–34.0)
MCHC: 31.9 g/dL (ref 30.0–36.0)
MCV: 77.4 fL — ABNORMAL LOW (ref 80.0–100.0)
Monocytes Absolute: 1.2 10*3/uL — ABNORMAL HIGH (ref 0.1–1.0)
Monocytes Relative: 7 %
Neutro Abs: 11.9 10*3/uL — ABNORMAL HIGH (ref 1.7–7.7)
Neutrophils Relative %: 74 %
Platelets: 523 10*3/uL — ABNORMAL HIGH (ref 150–400)
RBC: 3.85 MIL/uL — ABNORMAL LOW (ref 3.87–5.11)
RDW: 19.9 % — ABNORMAL HIGH (ref 11.5–15.5)
WBC: 16.2 10*3/uL — ABNORMAL HIGH (ref 4.0–10.5)
nRBC: 0.2 % (ref 0.0–0.2)

## 2023-09-03 LAB — PHOSPHORUS: Phosphorus: 2.6 mg/dL (ref 2.5–4.6)

## 2023-09-03 LAB — MAGNESIUM: Magnesium: 1.5 mg/dL — ABNORMAL LOW (ref 1.7–2.4)

## 2023-09-03 MED ORDER — CITALOPRAM HYDROBROMIDE 10 MG PO TABS
40.0000 mg | ORAL_TABLET | Freq: Every day | ORAL | Status: DC
  Administered 2023-09-03 – 2023-09-06 (×4): 40 mg via ORAL
  Filled 2023-09-03 (×4): qty 4

## 2023-09-03 MED ORDER — MIRTAZAPINE 15 MG PO TABS
15.0000 mg | ORAL_TABLET | Freq: Every day | ORAL | Status: DC
  Administered 2023-09-03: 15 mg via ORAL
  Filled 2023-09-03: qty 1

## 2023-09-03 MED ORDER — MAGNESIUM SULFATE 2 GM/50ML IV SOLN
2.0000 g | Freq: Once | INTRAVENOUS | Status: AC
  Administered 2023-09-03: 2 g via INTRAVENOUS
  Filled 2023-09-03: qty 50

## 2023-09-03 MED ORDER — POTASSIUM CHLORIDE CRYS ER 20 MEQ PO TBCR
40.0000 meq | EXTENDED_RELEASE_TABLET | ORAL | Status: AC
  Administered 2023-09-03 (×2): 40 meq via ORAL
  Filled 2023-09-03 (×2): qty 2

## 2023-09-03 NOTE — Progress Notes (Signed)
Progress Note   Patient: Jean Gomez DOB: 09/18/1956 DOA: 08/30/2023     4 DOS: the patient was seen and examined on 09/03/2023     Subjective:  Patient tells me her diarrhea is almost resolved She still continues to feel lethargic Tells me she has began to start tolerating some diet She is hoping that by tomorrow she will be able to go home Denies nausea vomiting abdominal pain no chest pain  Brief hospital course: From HPI "Jean Gomez is a 67 y.o. female with medical history significant of sarcoidosis, chronic pain, chronic respiratory failure on 2 L, depression, hyperlipidemia, seizure disorder presenting with sepsis, diarrhea.  Patient reports several weeks to months of recurring nonbilious nonbloody diarrhea.  Symptoms have progressively worsened especially over the past few weeks.  No fevers or chills.  Positive nausea mild generalized abdominal pain.  No chest pain.  Does have some shortness of breath and wheezing over the past week or so in setting of COPD.  Non-smoker.  Baseline sarcoidosis with chronic respiratory failure on 3 L.  Stable home oxygen use.  Has been using inhalers with minimal improvement in symptoms.  No reported rhinorrhea and nasal congestion. Presented to the ER Tmax nine 9.1, heart rate 100s, systolic blood pressure initially in the 80s however improved to the 120s with IV fluid resuscitation.  O2 sats stable at 100% on 3 L.  White count 24.8, hemoglobin 8.1, platelets 560.  Lactate 1.  Creatinine 1.54.  CT abdomen pelvis with fluid in right colon which may be indicative of acute diarrheal illness.  "   Assessment and Plan:     Sepsis secondary to acute gastroenteritis  Recurring episodes of nonbilious nonbloody diarrhea over several weeks has progressively worsened per the patient Noted EGD in July of this year with Dr. Mia Creek with finding of gastric ulcers Patient presented with WBC 24 with a pulse of 123 in the setting of acute  gastroenteritis Continue IV Rocephin and Flagyl for possible infectious coverage Plan of care discussed with gastroenterology with Dr. Timothy Lasso f C. difficile test is negative I have discussed the case with gastroenterologist who does not recommend any acute endoscopic intervention at this time GI panel reviewed by me shows no suspicious organisms Will continue supplemental fluid   Sepsis (HCC) Meeting sepsis criteria with heart rate in 100s as well as white count of 25 Had transient systolic blood pressure in the 80s so would technically be within the severe sepsis criteria though overall reassuring presentation Lactate 1 Noted likely predominant GI source in the setting of persistent diarrhea CT of the abdomen pelvis with fluid in the right colon Continue IV Rocephin and Flagyl for infectious coverage Culture results showing no growth till date   COPD (chronic obstructive pulmonary disease) (HCC) Chronic hypoxic respiratory failure Positive exacerbation with increased work of breathing, cough or wheezing over multiple days Has completed 5 days course of prednisone Continue DuoNebs IV Rocephin in the setting of sepsis associated with persistent diarrhea   Hypomagnesemia Hypokalemia Continue repletion and monitoring  Acute on chronic anemia secondary to anemia of chronic disease Hemoglobin 8  with baseline hemoglobin around 8-10 in setting of chronic medical issues including sarcoidosis and chronic respiratory failure No reported active bleeding Patient received 1 unit of blood transfusion on 09/01/2023 Monitor CBC closely Globin have improved appropriately   AKI (acute kidney injury) (HCC) Creatinine 1.54 with GFR in the 30s Clinically dry on presentation IV fluid hydration Continue monitoring renal function  Multiple gastric ulcers Noted gastric ulcers on EGD July 2023 with Dr. Mia Creek Schedule for repeat EGD October 25 IV PPI for now in the setting of IV steroid use  associated with COPD exacerbation Otherwise monitor closely   Sarcoidosis Baseline sarcoidosis with pulmonary manifestations Noted active COPD flare with IV steroid use Monitor   Hypercholesterolemia Continue statin therapy   History of seizure disorder Stable at present Continue Keppra   HTN, goal below 140/90 BP stable Titrate home regimen        Advance Care Planning:   Code Status: Prior    Consults: Gastroenterology    Family Communication: No family at the bedside          Physical Exam: HENT: Normocephalic atraumatic Eyes:     Extraocular Movements: Extraocular movements intact.  Cardiovascular:     Rate and Rhythm: Normal rate and regular rhythm.  Pulmonary: Clear to auscultation bilaterally.  Abdominal:     General: Bowel sounds are normal.  Musculoskeletal:        General: Normal range of motion.  Skin:    General: Skin is warm and dry.  Neurological:     General: No focal deficit present.  Psychiatric:        Mood and Affect: Mood normal.        Data Reviewed: I reviewed patient's lab results documented below as well as PT OT documentation and nursing documentation.    Latest Ref Rng & Units 09/03/2023    7:08 AM 09/02/2023   11:17 AM 09/01/2023    5:17 AM  CBC  WBC 4.0 - 10.5 K/uL 16.2  23.4  22.6   Hemoglobin 12.0 - 15.0 g/dL 9.5  18.8  7.0   Hematocrit 36.0 - 46.0 % 29.8  33.3  22.1   Platelets 150 - 400 K/uL 523  543  497        Latest Ref Rng & Units 09/03/2023    7:08 AM 09/02/2023   11:17 AM 08/31/2023   11:23 AM  BMP  Glucose 70 - 99 mg/dL 83  96  416   BUN 8 - 23 mg/dL 7  9  15    Creatinine 0.44 - 1.00 mg/dL 6.06  3.01  6.01   Sodium 135 - 145 mmol/L 139  138  140   Potassium 3.5 - 5.1 mmol/L 3.3  3.6  3.7   Chloride 98 - 111 mmol/L 104  103  109   CO2 22 - 32 mmol/L 26  24  19    Calcium 8.9 - 10.3 mg/dL 8.6  8.9  8.5      Vitals:   09/02/23 0900 09/02/23 1455 09/02/23 2313 09/03/23 1505  BP:  (!) 127/91 129/71 (!) 133/95   Pulse: 99 (!) 101 95 (!) 101  Resp:  18 16 18   Temp:  99.4 F (37.4 C) 99.1 F (37.3 C) 99.1 F (37.3 C)  TempSrc:   Oral   SpO2:  96% 91% 100%  Weight:      Height:         Author: Loyce Dys, MD 09/03/2023 4:53 PM  For on call review [email or web address].

## 2023-09-04 DIAGNOSIS — N179 Acute kidney failure, unspecified: Secondary | ICD-10-CM | POA: Diagnosis not present

## 2023-09-04 DIAGNOSIS — R109 Unspecified abdominal pain: Secondary | ICD-10-CM | POA: Diagnosis not present

## 2023-09-04 DIAGNOSIS — D72829 Elevated white blood cell count, unspecified: Secondary | ICD-10-CM | POA: Diagnosis not present

## 2023-09-04 LAB — CBC WITH DIFFERENTIAL/PLATELET
Abs Immature Granulocytes: 0.54 10*3/uL — ABNORMAL HIGH (ref 0.00–0.07)
Basophils Absolute: 0.1 10*3/uL (ref 0.0–0.1)
Basophils Relative: 0 %
Eosinophils Absolute: 0 10*3/uL (ref 0.0–0.5)
Eosinophils Relative: 0 %
HCT: 30.3 % — ABNORMAL LOW (ref 36.0–46.0)
Hemoglobin: 9.4 g/dL — ABNORMAL LOW (ref 12.0–15.0)
Immature Granulocytes: 3 %
Lymphocytes Relative: 16 %
Lymphs Abs: 2.6 10*3/uL (ref 0.7–4.0)
MCH: 24.1 pg — ABNORMAL LOW (ref 26.0–34.0)
MCHC: 31 g/dL (ref 30.0–36.0)
MCV: 77.7 fL — ABNORMAL LOW (ref 80.0–100.0)
Monocytes Absolute: 1.3 10*3/uL — ABNORMAL HIGH (ref 0.1–1.0)
Monocytes Relative: 8 %
Neutro Abs: 12.3 10*3/uL — ABNORMAL HIGH (ref 1.7–7.7)
Neutrophils Relative %: 73 %
Platelets: 567 10*3/uL — ABNORMAL HIGH (ref 150–400)
RBC: 3.9 MIL/uL (ref 3.87–5.11)
RDW: 20.1 % — ABNORMAL HIGH (ref 11.5–15.5)
WBC: 16.8 10*3/uL — ABNORMAL HIGH (ref 4.0–10.5)
nRBC: 0.3 % — ABNORMAL HIGH (ref 0.0–0.2)

## 2023-09-04 LAB — BASIC METABOLIC PANEL
Anion gap: 9 (ref 5–15)
BUN: <5 mg/dL — ABNORMAL LOW (ref 8–23)
CO2: 27 mmol/L (ref 22–32)
Calcium: 8.8 mg/dL — ABNORMAL LOW (ref 8.9–10.3)
Chloride: 102 mmol/L (ref 98–111)
Creatinine, Ser: 0.69 mg/dL (ref 0.44–1.00)
GFR, Estimated: >60 mL/min (ref >60)
Glucose, Bld: 78 mg/dL (ref 70–99)
Potassium: 4 mmol/L (ref 3.5–5.1)
Sodium: 138 mmol/L (ref 135–145)

## 2023-09-04 LAB — CULTURE, BLOOD (ROUTINE X 2)
Culture: NO GROWTH
Culture: NO GROWTH
Special Requests: ADEQUATE

## 2023-09-04 LAB — MAGNESIUM: Magnesium: 2.2 mg/dL (ref 1.7–2.4)

## 2023-09-04 MED ORDER — LINACLOTIDE 145 MCG PO CAPS
290.0000 ug | ORAL_CAPSULE | Freq: Every morning | ORAL | Status: DC
  Administered 2023-09-04 – 2023-09-06 (×3): 290 ug via ORAL
  Filled 2023-09-04 (×2): qty 1
  Filled 2023-09-04 (×2): qty 2
  Filled 2023-09-04: qty 1

## 2023-09-04 MED ORDER — PANTOPRAZOLE SODIUM 40 MG PO TBEC: 40.0000 mg | DELAYED_RELEASE_TABLET | Freq: Every morning | ORAL | Status: DC

## 2023-09-04 MED ORDER — AMLODIPINE-OLMESARTAN 5-40 MG PO TABS: 1.0000 | ORAL_TABLET | Freq: Every morning | ORAL | Status: DC

## 2023-09-04 MED ORDER — SODIUM CHLORIDE 0.9 % IV SOLN: INTRAVENOUS | Status: DC

## 2023-09-04 MED ORDER — HYDROCHLOROTHIAZIDE 25 MG PO TABS
25.0000 mg | ORAL_TABLET | Freq: Every morning | ORAL | Status: DC
  Administered 2023-09-04 – 2023-09-06 (×3): 25 mg via ORAL
  Filled 2023-09-04 (×3): qty 1

## 2023-09-04 MED ORDER — LOPERAMIDE HCL 2 MG PO CAPS
2.0000 mg | ORAL_CAPSULE | ORAL | Status: AC | PRN
  Administered 2023-09-05 (×2): 2 mg via ORAL
  Filled 2023-09-04 (×2): qty 1

## 2023-09-04 MED ORDER — AMLODIPINE BESYLATE 5 MG PO TABS
5.0000 mg | ORAL_TABLET | Freq: Every day | ORAL | Status: DC
  Administered 2023-09-04 – 2023-09-06 (×3): 5 mg via ORAL
  Filled 2023-09-04 (×3): qty 1

## 2023-09-04 MED ORDER — MIRTAZAPINE 15 MG PO TABS
15.0000 mg | ORAL_TABLET | Freq: Every day | ORAL | Status: DC
  Administered 2023-09-04 – 2023-09-05 (×2): 15 mg via ORAL
  Filled 2023-09-04 (×2): qty 1

## 2023-09-04 MED ORDER — LOPERAMIDE HCL 2 MG PO CAPS
4.0000 mg | ORAL_CAPSULE | Freq: Once | ORAL | Status: AC
  Administered 2023-09-04: 4 mg via ORAL
  Filled 2023-09-04: qty 2

## 2023-09-04 MED ORDER — IRBESARTAN 150 MG PO TABS
300.0000 mg | ORAL_TABLET | Freq: Every day | ORAL | Status: DC
  Administered 2023-09-04 – 2023-09-06 (×3): 300 mg via ORAL
  Filled 2023-09-04 (×3): qty 2

## 2023-09-04 MED ORDER — LOPERAMIDE HCL 2 MG PO CAPS: 2.0000 mg | ORAL_CAPSULE | ORAL | Status: DC | PRN

## 2023-09-04 MED ORDER — OXYCODONE HCL 5 MG PO TABS
10.0000 mg | ORAL_TABLET | Freq: Three times a day (TID) | ORAL | Status: DC | PRN
  Administered 2023-09-04 – 2023-09-06 (×7): 10 mg via ORAL
  Filled 2023-09-04 (×7): qty 2

## 2023-09-04 MED ORDER — DONEPEZIL HCL 5 MG PO TABS
10.0000 mg | ORAL_TABLET | Freq: Every morning | ORAL | Status: DC
  Administered 2023-09-04 – 2023-09-06 (×3): 10 mg via ORAL
  Filled 2023-09-04 (×3): qty 2

## 2023-09-04 MED ORDER — ATORVASTATIN CALCIUM 20 MG PO TABS
80.0000 mg | ORAL_TABLET | Freq: Every day | ORAL | Status: DC
  Administered 2023-09-04 – 2023-09-05 (×2): 80 mg via ORAL
  Filled 2023-09-04 (×2): qty 4

## 2023-09-04 MED ORDER — ASPIRIN 81 MG PO TBEC
81.0000 mg | DELAYED_RELEASE_TABLET | Freq: Every day | ORAL | Status: DC
  Administered 2023-09-04 – 2023-09-06 (×3): 81 mg via ORAL
  Filled 2023-09-04 (×3): qty 1

## 2023-09-04 NOTE — Plan of Care (Signed)
  Problem: Clinical Measurements: Goal: Diagnostic test results will improve Outcome: Progressing Goal: Signs and symptoms of infection will decrease Outcome: Progressing   Problem: Health Behavior/Discharge Planning: Goal: Ability to manage health-related needs will improve Outcome: Progressing   Problem: Clinical Measurements: Goal: Ability to maintain clinical measurements within normal limits will improve Outcome: Progressing

## 2023-09-04 NOTE — Progress Notes (Signed)
Progress Note   Patient: Jean Gomez DOB: 1956/12/07 DOA: 08/30/2023     5 DOS: the patient was seen and examined on 09/04/2023       Subjective:  Patient tells me her diarrhea is almost resolved Patient had several episodes of diarrhea overnight Has had about 5 watery stool today Denies nausea vomiting or worsening abdominal pain    Brief hospital course: From HPI "Jean Gomez is a 67 y.o. female with medical history significant of sarcoidosis, chronic pain, chronic respiratory failure on 2 L, depression, hyperlipidemia, seizure disorder presenting with sepsis, diarrhea.  Patient reports several weeks to months of recurring nonbilious nonbloody diarrhea.  Symptoms have progressively worsened especially over the past few weeks.  No fevers or chills.  Positive nausea mild generalized abdominal pain.  No chest pain.  Does have some shortness of breath and wheezing over the past week or so in setting of COPD.  Non-smoker.  Baseline sarcoidosis with chronic respiratory failure on 3 L.  Stable home oxygen use.  Has been using inhalers with minimal improvement in symptoms.  No reported rhinorrhea and nasal congestion. Presented to the ER Tmax nine 9.1, heart rate 100s, systolic blood pressure initially in the 80s however improved to the 120s with IV fluid resuscitation.  O2 sats stable at 100% on 3 L.  White count 24.8, hemoglobin 8.1, platelets 560.  Lactate 1.  Creatinine 1.54.  CT abdomen pelvis with fluid in right colon which may be indicative of acute diarrheal illness.  "   Assessment and Plan:     Sepsis secondary to acute gastroenteritis  Recurring episodes of nonbilious nonbloody diarrhea over several weeks has progressively worsened per the patient Noted EGD in July of this year with Dr. Mia Creek with finding of gastric ulcers Patient presented with WBC 24 with a pulse of 123 in the setting of acute gastroenteritis Patient is on empiric IV Rocephin and Flagyl  for possible infectious coverage Plan of care discussed with gastroenterology with Dr. Timothy Lasso  C. difficile test is negative and added GI panel did not show any organisms I have discussed the case with gastroenterologist who does not recommend any acute endoscopic intervention at this time Patient initially planned for discharge today however had about 5 episodes of diarrhea today Possible discharge tomorrow if diarrhea is improved   Sepsis (HCC) Meeting sepsis criteria with heart rate in 100s as well as white count of 25 Had transient systolic blood pressure in the 80s so would technically be within the severe sepsis criteria though overall reassuring presentation Lactate 1 Noted likely predominant GI source in the setting of persistent diarrhea CT of the abdomen pelvis with fluid in the right colon Continue IV Rocephin and Flagyl for infectious coverage Culture results showing no growth till date   COPD (chronic obstructive pulmonary disease) (HCC) Chronic hypoxic respiratory failure Positive exacerbation with increased work of breathing, cough or wheezing over multiple days Has completed 5 days course of prednisone Continue DuoNebs IV Rocephin in the setting of sepsis associated with persistent diarrhea   Hypomagnesemia Hypokalemia Continue repletion and monitoring   Acute on chronic anemia secondary to anemia of chronic disease Hemoglobin 8  with baseline hemoglobin around 8-10 in setting of chronic medical issues including sarcoidosis and chronic respiratory failure No reported active bleeding Patient received 1 unit of blood transfusion on 09/01/2023 Monitor CBC closely Globin have improved appropriately   AKI (acute kidney injury) (HCC)-resolved Creatinine 1.54 with GFR in the 30s Clinically dry on  presentation Continue IV fluid in the setting of diarrhea Continue monitoring renal function   Multiple gastric ulcers Noted gastric ulcers on EGD July 2023 with Dr.  Mia Creek Schedule for repeat EGD October 25 Continue PPI therapy Otherwise monitor closely   Sarcoidosis Baseline sarcoidosis with pulmonary manifestations Noted active COPD flare with IV steroid use Monitor   Hypercholesterolemia Continue statin therapy   History of seizure disorder Stable at present Continue Keppra   HTN, goal below 140/90 BP stable Titrate home regimen        Advance Care Planning:   Code Status: Prior    Consults: Gastroenterology    Family Communication: No family at the bedside          Physical Exam: HENT: Normocephalic atraumatic Eyes:     Extraocular Movements: Extraocular movements intact.  Cardiovascular:     Rate and Rhythm: Normal rate and regular rhythm.  Pulmonary: Clear to auscultation bilaterally.  Abdominal:     General: Bowel sounds are normal.  Musculoskeletal:        General: Normal range of motion.  Skin:    General: Skin is warm and dry.  Neurological:     General: No focal deficit present.  Psychiatric:        Mood and Affect: Mood normal.        Data Reviewed: I reviewed patient's lab results documented below as well as PT OT documentation and nursing documentation.      Latest Ref Rng & Units 09/04/2023    4:35 AM 09/03/2023    7:08 AM 09/02/2023   11:17 AM  CBC  WBC 4.0 - 10.5 K/uL 16.8  16.2  23.4   Hemoglobin 12.0 - 15.0 g/dL 9.4  9.5  28.4   Hematocrit 36.0 - 46.0 % 30.3  29.8  33.3   Platelets 150 - 400 K/uL 567  523  543        Latest Ref Rng & Units 09/04/2023    4:35 AM 09/03/2023    7:08 AM 09/02/2023   11:17 AM  BMP  Glucose 70 - 99 mg/dL 78  83  96   BUN 8 - 23 mg/dL 5  7  9    Creatinine 0.44 - 1.00 mg/dL 1.32  4.40  1.02   Sodium 135 - 145 mmol/L 138  139  138   Potassium 3.5 - 5.1 mmol/L 4.0  3.3  3.6   Chloride 98 - 111 mmol/L 102  104  103   CO2 22 - 32 mmol/L 27  26  24    Calcium 8.9 - 10.3 mg/dL 8.8  8.6  8.9      Vitals:   09/03/23 2308 09/04/23 0500 09/04/23 0859 09/04/23 1552   BP: (!) 149/86  (!) 151/108 (!) 140/75  Pulse: 88  97 (!) 102  Resp: 20  14 18   Temp: 98.9 F (37.2 C)  98.9 F (37.2 C) 99 F (37.2 C)  TempSrc: Oral     SpO2: 100%  98% 100%  Weight:  82 kg    Height:        Time spent: 38 minutes  Author: Loyce Dys, MD 09/04/2023 5:49 PM  For on call review [email or web address].

## 2023-09-04 NOTE — TOC Progression Note (Signed)
Transition of Care [facility]) - Progression Note    Patient Details  Name: Jean Gomez MRN: [id number] Date of Birth: [birth year]  [facility] CM/SW Contact  Marlowe Sax, RN Phone Number: 09/04/2023, 3:41 PM  Clinical Narrative:     Met with the patient in the room, she lives at home alone, her daughter provides transportation to the doctor and will transport her at DC She has DME at home and stated that she does not need additional, she has Oxygen at home She stated that she has not needs  Expected Discharge Plan: Home/Self Care Barriers to Discharge: No Barriers Identified  Expected Discharge Plan and Services       Living arrangements for the past 2 months: Single Family Home                 DME Arranged: N/A DME Agency: NA       HH Arranged: NA           Social Determinants of Health (SDOH) Interventions SDOH Screenings   Food Insecurity: No Food Insecurity (08/30/2023)  Housing: Low Risk  (08/30/2023)  Transportation Needs: No Transportation Needs (08/30/2023)  Utilities: Not At Risk (08/30/2023)  Alcohol Screen: Low Risk  (04/17/2018)  Depression (PHQ2-9): Low Risk  (08/26/2020)  Financial Resource Strain: Low Risk  (03/05/2018)  Physical Activity: Unknown (03/05/2018)  Social Connections: Unknown (03/05/2018)  Stress: No Stress Concern Present (03/05/2018)  Tobacco Use: Low Risk  (07/02/2023)    Readmission Risk Interventions     No data to display

## 2023-09-04 NOTE — Progress Notes (Signed)
Physical Therapy Treatment Patient Details Name: Jean Gomez MRN: [id number] DOB: [birth year] Today's Date: 09/04/2023   History of Present Illness 67 y.o. female with medical history significant of sarcoidosis, chronic pain, chronic respiratory failure on 2 L, depression, hyperlipidemia, seizure disorder presenting with sepsis, diarrhea.  Patient reports several weeks to months of recurring nonbilious nonbloody diarrhea.  Symptoms have progressively worsened especially over the past few weeks.  No fevers or chills.  Positive nausea mild generalized abdominal pain.  No chest pain.  Does have some shortness of breath and wheezing over the past week or so in setting of COPD.  Non-smoker.  Baseline sarcoidosis with chronic respiratory failure on 3 L.  Stable home oxygen use.  Has been using inhalers with minimal improvement in symptoms.  No reported rhinorrhea and nasal congestion.    PT Comments  Pt was side lying in bed upon arrival. She is A and O x 4 and agreeable to session. Pt demonstrated safe steady abilities to exit bed, stand without AD, and ambulate ~ 100 ft without difficulty. Pt is on O2 at baseline. Sao2 > 91% throughout session. No LOB or safety concerns during ambulation. Pt did not want HH services at baseline however would benefit from continued skilled PT to maximize her activity tolerance with all ADLs. " I want to go home this afternoon is the diarrhea stays gone."    If plan is discharge home, recommend the following: A little help with walking and/or transfers;A little help with bathing/dressing/bathroom;Assist for transportation;Help with stairs or ramp for entrance     Equipment Recommendations  None recommended by PT       Precautions / Restrictions Precautions Precautions: Fall Restrictions Weight Bearing Restrictions: No     Mobility  Bed Mobility Overal bed mobility: Modified Independent   Transfers Overall transfer level: Modified independent Equipment used:  None Transfers: Sit to/from Stand Sit to Stand: Modified independent (Device/Increase time)     Ambulation/Gait Ambulation/Gait assistance: Modified independent (Device/Increase time) Gait Distance (Feet): 100 Feet Assistive device: None Gait Pattern/deviations: Step-through pattern Gait velocity: decreased  General Gait Details: Pt demonstrated safe abilities to ambulate ~ 100 ft without LOB or safety concerns. Pt is on o2 at baseline. sao2 >91% throughout       Cognition Arousal: Alert Behavior During Therapy: WFL for tasks assessed/performed Overall Cognitive Status: Within Functional Limits for tasks assessed    General Comments: Pt is A and O and agreeable to OOB activity               Pertinent Vitals/Pain Pain Assessment Pain Assessment: No/denies pain Faces Pain Scale: No hurt     PT Goals (current goals can now be found in the care plan section) Acute Rehab PT Goals Patient Stated Goal: DC to home Progress towards PT goals: Progressing toward goals    Frequency    Min 1X/week       AM-PAC PT "6 Clicks" Mobility   Outcome Measure  Help needed turning from your back to your side while in a flat bed without using bedrails?: None Help needed moving from lying on your back to sitting on the side of a flat bed without using bedrails?: None Help needed moving to and from a bed to a chair (including a wheelchair)?: None Help needed standing up from a chair using your arms (e.g., wheelchair or bedside chair)?: A Little Help needed to walk in hospital room?: A Little Help needed climbing 3-5 steps with a railing? : A Little  6 Click Score: 21    End of Session Equipment Utilized During Treatment: Oxygen Activity Tolerance: Patient tolerated treatment well;No increased pain Patient left: in bed;with call bell/phone within reach;with bed alarm set Nurse Communication: Mobility status PT Visit Diagnosis: Unsteadiness on feet (R26.81);Difficulty in walking, not  elsewhere classified (R26.2)     Time: 2130-8657 PT Time Calculation (min) (ACUTE ONLY): 12 min  Charges:    $Gait Training: 8-22 mins PT General Charges $$ ACUTE PT VISIT: 1 Visit                     Jetta Lout PTA 09/04/23, 12:48 PM

## 2023-09-05 DIAGNOSIS — R197 Diarrhea, unspecified: Secondary | ICD-10-CM | POA: Diagnosis not present

## 2023-09-05 MED ORDER — LOPERAMIDE HCL 2 MG PO CAPS
2.0000 mg | ORAL_CAPSULE | Freq: Two times a day (BID) | ORAL | Status: DC | PRN
  Administered 2023-09-05: 2 mg via ORAL
  Filled 2023-09-05: qty 1

## 2023-09-05 NOTE — Care Management Important Message (Signed)
Important Message  Patient Details  Name: Jean Gomez MRN: [id number] Date of Birth: 25-Dec-1955   Medicare Important Message Given:  Yes     Olegario Messier A [name] 09/05/2023, 10:53 AM

## 2023-09-05 NOTE — Progress Notes (Signed)
PROGRESS NOTE    Jean Gomez  AYT:[id number] DOB: [birth year] DOA: 08/30/2023 PCP: Sherron Monday, MD   Assessment & Plan:   Principal Problem:   Diarrhea Active Problems:   Sepsis (HCC)   COPD (chronic obstructive pulmonary disease) (HCC)   Chronic respiratory failure with hypoxia (HCC)   HTN, goal below 140/90   History of seizure disorder   Hypercholesterolemia   Sarcoidosis   Multiple gastric ulcers   AKI (acute kidney injury) (HCC)   Anemia of chronic disease  Assessment and Plan: Acute gastroenteritis: w/ diarrhea. GI PCR panel, c. diff were neg. Continue on rocephin, flagyl for possible infectious coverage. EGD in July 2024 showed gastric ulcers. No acute scopes needed currently as per GI. Advanced to soft diet today   Sepsis: met criteria w/ tachycardia, leukocytosis, transient hypotension & likely acute gastroenteritis. CT abd/pelvis shows fluid in the right colon. Continue on IV abxs.   COPD: completed steroid course. Continue on bronchodilators & encourage incentive spirometry.   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 No need for a transfusion currently. S/p 1 unit of pRBCs transfused so far    AKI: resolved    Multiple gastric ulcers: found on EGD in July 2023. Repeat EGD in Oct. Continue on PPI    Sarcoidosis: completed steroid course. Continue w/ supportive care    HLD: continue on statine    Hx of seizure disorder: continue on keppra   HTN: continue on amlodipine, irbesartan      DVT prophylaxis: lovenox  Code Status: full  Family Communication: did not want me to call any family or friends  Disposition Plan: likely d/c back home   Level of care: Med-Surg Status is: Inpatient Remains inpatient appropriate because: severity of illness    Consultants:  GI   Procedures:   Antimicrobials: rocephin, flagyl     Subjective: Pt c/o intermittent abd pain   Objective: Vitals:   09/04/23 1552 09/05/23 0000 09/05/23 0043 09/05/23 0422  BP: (!) 140/75 (!) 145/97 (!) 152/85   Pulse: (!) 102 (!) 108 (!) 102   Resp: 18 18    Temp: 99 F (37.2 C) 98.1 F (36.7 C)    TempSrc:      SpO2: 100% 98%    Weight:    81.9 kg  Height:        Intake/Output Summary (Last 24 hours) at 09/05/2023 0828 Last data filed at 09/05/2023 0300 Gross per 24 hour  Intake 132.82 ml  Output --  Net 132.82 ml   Filed Weights   09/02/23 0504 09/04/23 0500 09/05/23 0422  Weight: 81.7 kg 82 kg 81.9 kg    Examination:  General exam: Appears calm and comfortable  Respiratory system: Clear to auscultation. Respiratory effort normal. Cardiovascular system: S1 & S2+. No  rubs, gallops or clicks.  Gastrointestinal system: Abdomen is nondistended, soft and nontender.  Normal bowel sounds heard. Central nervous system: Alert and oriented. Moves all extremities  Psychiatry: Judgement and insight appear normal. Flat mood and affect     Data Reviewed: I have personally reviewed following labs and imaging studies  CBC: Recent Labs  Lab 09/01/23 0517 09/02/23 1117 09/03/23 0708 09/04/23 0435 09/05/23 0324  WBC 22.6* 23.4* 16.2* 16.8* 17.2*  NEUTROABS 19.5* 18.2* 11.9* 12.3* 12.5*  HGB 7.0* 10.2* 9.5* 9.4* 9.2*  HCT 22.1* 33.3* 29.8* 30.3* 29.0*  MCV 74.7* 78.9* 77.4* 77.7*  76.9*  PLT 497* 543* 523* 567* 580*   Basic Metabolic Panel: Recent Labs  Lab 08/31/23 1123 09/01/23 0517 09/02/23 1117 09/03/23 0708 09/04/23 0435 09/05/23 0324  NA 140  --  138 139 138 140  K 3.7  --  3.6 3.3* 4.0 3.4*  CL 109  --  103 104 102 98  CO2 19*  --  24 26 27 27   GLUCOSE 111*  --  96 83 78 78  BUN 15  --  9 7* <5* <5*  CREATININE 0.77  --  0.65 0.61 0.69 0.66  CALCIUM 8.5*  --  8.9 8.6* 8.8* 8.9  MG  --  1.7 1.9 1.5* 2.2  --   PHOS  --  2.6 2.4* 2.6  --   --    GFR: Estimated Creatinine Clearance: 69.2 mL/min (by  C-G formula based on SCr of 0.66 mg/dL). Liver Function Tests: Recent Labs  Lab 08/30/23 1131  AST 19  ALT 16  ALKPHOS 123  BILITOT 0.3  PROT 7.0  ALBUMIN 2.7*   No results for input(s): "LIPASE", "AMYLASE" in the last 168 hours. No results for input(s): "AMMONIA" in the last 168 hours. Coagulation Profile: Recent Labs  Lab 08/30/23 1131  INR 1.1   Cardiac Enzymes: No results for input(s): "CKTOTAL", "CKMB", "CKMBINDEX", "TROPONINI" in the last 168 hours. BNP (last 3 results) No results for input(s): "PROBNP" in the last 8760 hours. HbA1C: No results for input(s): "HGBA1C" in the last 72 hours. CBG: No results for input(s): "GLUCAP" in the last 168 hours. Lipid Profile: No results for input(s): "CHOL", "HDL", "LDLCALC", "TRIG", "CHOLHDL", "LDLDIRECT" in the last 72 hours. Thyroid Function Tests: No results for input(s): "TSH", "T4TOTAL", "FREET4", "T3FREE", "THYROIDAB" in the last 72 hours. Anemia Panel: No results for input(s): "VITAMINB12", "FOLATE", "FERRITIN", "TIBC", "IRON", "RETICCTPCT" in the last 72 hours. Sepsis Labs: Recent Labs  Lab 08/30/23 1131 08/30/23 1624  LATICACIDVEN 1.0 1.1    Recent Results (from the past 240 hour(s))  Culture, blood (Routine x 2)     Status: None   Collection Time: 08/30/23 11:11 AM   Specimen: BLOOD  Result Value Ref Range Status   Specimen Description BLOOD BLOOD LEFT HAND  Final   Special Requests   Final    BOTTLES DRAWN AEROBIC AND ANAEROBIC Blood Culture results may not be optimal due to an inadequate volume of blood received in culture bottles   Culture   Final    NO GROWTH 5 DAYS Performed at [facility], [address] [place], [place] [address]    Report Status 09/04/2023 FINAL  Final  SARS Coronavirus 2 by RT PCR (hospital order, performed in [facility] hospital lab) *cepheid single result test* Urine, Catheterized     Status: None   Collection Time: 08/30/23 11:11 AM   Specimen: Urine,  Catheterized; Nasal Swab  Result Value Ref Range Status   SARS Coronavirus 2 by RT PCR NEGATIVE NEGATIVE Final    Comment: (NOTE) SARS-CoV-2 target nucleic acids are NOT DETECTED.  The SARS-CoV-2 RNA is generally detectable in upper and lower respiratory specimens during the acute phase of infection. The lowest concentration of SARS-CoV-2 viral copies this assay can detect is 250 copies / mL. A negative result does not preclude SARS-CoV-2 infection and should not be used as the sole basis for treatment or other patient management decisions.  A negative result may occur with improper specimen collection / handling, submission of specimen other than nasopharyngeal swab, presence of viral  mutation(s) within the areas targeted by this assay, and inadequate number of viral copies (<250 copies / mL). A negative result must be combined with clinical observations, patient history, and epidemiological information.  Fact Sheet for Patients:   RoadLapTop.co.za  Fact Sheet for Healthcare Providers: http://kim-miller.com/  This test is not yet approved or  cleared by the [place] FDA and has been authorized for detection and/or diagnosis of SARS-CoV-2 by FDA under an Emergency Use Authorization (EUA).  This EUA will remain in effect (meaning this test can be used) for the duration of the COVID-19 declaration under Section 564(b)(1) of the Act, 21 U.S.C. section 360bbb-3(b)(1), unless the authorization is terminated or revoked sooner.  Performed at [facility], [address] [place], [place] [address]   Culture, blood (Routine x 2)     Status: None   Collection Time: 08/30/23 11:31 AM   Specimen: BLOOD  Result Value Ref Range Status   Specimen Description BLOOD BLOOD LEFT ARM  Final   Special Requests   Final    BOTTLES DRAWN AEROBIC AND ANAEROBIC Blood Culture adequate volume   Culture   Final    NO GROWTH 5 DAYS Performed  at [facility], [address], [place], [place] [address]    Report Status 09/04/2023 FINAL  Final  C Difficile Quick Screen w PCR reflex     Status: None   Collection Time: 08/30/23  3:13 PM   Specimen: STOOL  Result Value Ref Range Status   C Diff antigen NEGATIVE NEGATIVE Final   C Diff toxin NEGATIVE NEGATIVE Final   C Diff interpretation No C. difficile detected.  Final    Comment: Performed at [facility], [address] [place], [place] [address]  Gastrointestinal Panel by PCR , Stool     Status: None   Collection Time: 08/30/23  3:13 PM   Specimen: STOOL  Result Value Ref Range Status   Campylobacter species NOT DETECTED NOT DETECTED Final   Plesimonas shigelloides NOT DETECTED NOT DETECTED Final   Salmonella species NOT DETECTED NOT DETECTED Final   Yersinia enterocolitica NOT DETECTED NOT DETECTED Final   Vibrio species NOT DETECTED NOT DETECTED Final   Vibrio cholerae NOT DETECTED NOT DETECTED Final   Enteroaggregative E coli (EAEC) NOT DETECTED NOT DETECTED Final   Enteropathogenic E coli (EPEC) NOT DETECTED NOT DETECTED Final   Enterotoxigenic E coli (ETEC) NOT DETECTED NOT DETECTED Final   Shiga like toxin producing E coli (STEC) NOT DETECTED NOT DETECTED Final   Shigella/Enteroinvasive E coli (EIEC) NOT DETECTED NOT DETECTED Final   Cryptosporidium NOT DETECTED NOT DETECTED Final   Cyclospora cayetanensis NOT DETECTED NOT DETECTED Final   Entamoeba histolytica NOT DETECTED NOT DETECTED Final   Giardia lamblia NOT DETECTED NOT DETECTED Final   Adenovirus F40/41 NOT DETECTED NOT DETECTED Final   Astrovirus NOT DETECTED NOT DETECTED Final   Norovirus GI/GII NOT DETECTED NOT DETECTED Final   Rotavirus A NOT DETECTED NOT DETECTED Final   Sapovirus (I, II, IV, and V) NOT DETECTED NOT DETECTED Final    Comment: Performed at [facility], [address], [place], [place] [address]         Radiology Studies: No results  found.      Scheduled Meds:  amLODipine  5 mg Oral Daily   And   irbesartan  300 mg Oral Daily   aspirin EC  81 mg Oral Daily   atorvastatin  80 mg Oral QHS  citalopram  40 mg Oral Daily   donepezil  10 mg Oral q morning   enoxaparin (LOVENOX) injection  40 mg Subcutaneous Q24H   feeding supplement  1 Container Oral TID BM   hydrochlorothiazide  25 mg Oral q morning   levETIRAcetam  750 mg Oral q morning   linaclotide  290 mcg Oral q morning   mirtazapine  15 mg Oral QHS   multivitamin with minerals  1 tablet Oral Daily   pantoprazole  40 mg Oral BID   Continuous Infusions:  sodium chloride 75 mL/hr at 09/05/23 0255   cefTRIAXone (ROCEPHIN)  IV 2 g (09/04/23 1811)   metronidazole 500 mg (09/05/23 0527)     LOS: 6 days       Charise Killian, MD [facility] Pager [phone]  If 7PM-7AM, please contact night-coverage [email or web address] 09/05/2023, 8:28 AM

## 2023-09-05 NOTE — Progress Notes (Signed)
Physical Therapy Discharge Patient Details Name: Jean Gomez MRN: [id number] DOB: 05-31-1956 Today's Date: 09/05/2023 Time:  -     Patient discharged from PT services secondary to goals met and no further PT needs identified.  Please see latest therapy progress note for current level of functioning and progress toward goals.    Progress and discharge plan discussed with patient and/or caregiver: Patient/Caregiver agrees with plan  Patient reports she is ambulating in room and getting up to recliner. She will benefit from mobilization from mobility specialists or nursing staff. No further skilled PT needs at this time.      Oluwadarasimi Redmon 09/05/2023, 11:06 AM

## 2023-09-05 NOTE — Progress Notes (Signed)
Nutrition Follow-up  DOCUMENTATION CODES:   Obesity unspecified  INTERVENTION:   -D/c Boost Breeze -Case discussed with Dr Mayford Knife; plan to advance from full liquid diet to soft diet -Continue MVI with minerals daily  NUTRITION DIAGNOSIS:   Inadequate oral intake related to acute illness as evidenced by per patient/family report.  Ongoing  GOAL:   Patient will meet greater than or equal to 90% of their needs  Progressing   MONITOR:   PO intake, Supplement acceptance, Labs, Weight trends, I & O's, Skin  REASON FOR ASSESSMENT:   Consult Assessment of nutrition requirement/status  ASSESSMENT:   67 y/o female with h/o HTN, seizures, COPD, sarcoidosis, PUD, chronic pain, anxiety, GERD, MDD, OSA, hiatal hernia, DDD and partial colectomy 2016 (after ingesting safety pin) who is admitted with diarrhea and sepsis.  Reviewed I/O's: +133 ml x 24 hours and +3.8 L since admission  Spoke with pt at bedside, who was pleasant and in good spirits today. Pt reports feeling better and diarrhea has improved ("now more mushy BMs"). Pt reports she has a good appetite, but is becoming tired of options on full liquid diet and thinks she is ready for solid foods. Case discussed with MD; received permission to advance diet. Pt very grateful for diet advancement.   Pt reports she does not tolerate Ensure or Boost supplements well. Pt reports she can tolerate the Boost Breeze, but does not care for the berry flavor and is disappointed that other flavor options are not available. Pt declines offer of alternative supplement. Discussed importance of good meal intake to promote healing. Pt expressed appreciation for visit.   Wt has been stable since admission.   Per TOC notes, plan home with home health services.   Medications reviewed and include remeron and keppra.   Labs reviewed: K: 3.4.    Diet Order:   Diet Order             DIET SOFT Room service appropriate? Yes; Fluid consistency:  Thin  Diet effective now                   EDUCATION NEEDS:   Education needs have been addressed  Skin:  Skin Assessment: Reviewed RN Assessment  Last BM:  09/05/23 (type 6)  Height:   Ht Readings from Last 1 Encounters:  08/30/23 5\' 3"  (1.6 m)    Weight:   Wt Readings from Last 1 Encounters:  09/05/23 81.9 kg    Ideal Body Weight:  52.2 kg  BMI:  Body mass index is 31.98 kg/m.  Estimated Nutritional Needs:   Kcal:  1700-1900kcal/day  Protein:  85-95g/day  Fluid:  1.4-1.6L/day    Levada Schilling, RD, LDN, CDCES Registered Dietitian II Certified Diabetes Care and Education Specialist Please refer to [facility] for RD and/or RD on-call/weekend/after hours pager

## 2023-09-05 NOTE — Plan of Care (Signed)
  Problem: Education: Goal: Knowledge of General Education information will improve Description: Including pain rating scale, medication(s)/side effects and non-pharmacologic comfort measures Outcome: Progressing   Problem: Clinical Measurements: Goal: Diagnostic test results will improve Outcome: Progressing Goal: Signs and symptoms of infection will decrease Outcome: Progressing   Problem: Health Behavior/Discharge Planning: Goal: Ability to manage health-related needs will improve Outcome: Progressing   Problem: Activity: Goal: Risk for activity intolerance will decrease Outcome: Progressing   Problem: Nutrition: Goal: Adequate nutrition will be maintained Outcome: Progressing   Problem: Coping: Goal: Level of anxiety will decrease Outcome: Progressing   Problem: Safety: Goal: Ability to remain free from injury will improve Outcome: Progressing   Problem: Skin Integrity: Goal: Risk for impaired skin integrity will decrease Outcome: Progressing

## 2023-09-06 DIAGNOSIS — R197 Diarrhea, unspecified: Secondary | ICD-10-CM | POA: Diagnosis not present

## 2023-09-06 LAB — CBC
HCT: 30.9 % — ABNORMAL LOW (ref 36.0–46.0)
Hemoglobin: 9.7 g/dL — ABNORMAL LOW (ref 12.0–15.0)
MCH: 24.6 pg — ABNORMAL LOW (ref 26.0–34.0)
MCHC: 31.4 g/dL (ref 30.0–36.0)
MCV: 78.4 fL — ABNORMAL LOW (ref 80.0–100.0)
Platelets: 617 10*3/uL — ABNORMAL HIGH (ref 150–400)
RBC: 3.94 MIL/uL (ref 3.87–5.11)
RDW: 20.6 % — ABNORMAL HIGH (ref 11.5–15.5)
WBC: 14 10*3/uL — ABNORMAL HIGH (ref 4.0–10.5)
nRBC: 0.1 % (ref 0.0–0.2)

## 2023-09-06 LAB — BASIC METABOLIC PANEL
Anion gap: 11 (ref 5–15)
BUN: <5 mg/dL — ABNORMAL LOW (ref 8–23)
CO2: 30 mmol/L (ref 22–32)
Calcium: 8.3 mg/dL — ABNORMAL LOW (ref 8.9–10.3)
Chloride: 99 mmol/L (ref 98–111)
Creatinine, Ser: 0.65 mg/dL (ref 0.44–1.00)
GFR, Estimated: >60 mL/min (ref >60)
Glucose, Bld: 85 mg/dL (ref 70–99)
Potassium: 3.3 mmol/L — ABNORMAL LOW (ref 3.5–5.1)
Sodium: 140 mmol/L (ref 135–145)

## 2023-09-06 MED ORDER — POTASSIUM CHLORIDE CRYS ER 20 MEQ PO TBCR
40.0000 meq | EXTENDED_RELEASE_TABLET | Freq: Once | ORAL | Status: AC
  Administered 2023-09-06: 40 meq via ORAL
  Filled 2023-09-06: qty 2

## 2023-09-06 NOTE — Progress Notes (Signed)
Jean Gomez to be D/C'd home per MD order. Discussed with the patient and all questions fully answered.  Skin clean, dry and intact without evidence of skin break down, no evidence of skin tears noted.  IV catheter discontinued intact. Site without signs and symptoms of complications. Dressing and pressure applied.  An After Visit Summary was printed and given to the patient.  Patient escorted via WC, and D/C home via private auto.  Jean Gomez  09/06/2023

## 2023-09-06 NOTE — Plan of Care (Signed)
  Problem: Fluid Volume: Goal: Hemodynamic stability will improve Outcome: Progressing   Problem: Clinical Measurements: Goal: Diagnostic test results will improve Outcome: Progressing Goal: Signs and symptoms of infection will decrease Outcome: Progressing   Problem: Respiratory: Goal: Ability to maintain adequate ventilation will improve Outcome: Progressing   Problem: Education: Goal: Knowledge of General Education information will improve Description: Including pain rating scale, medication(s)/side effects and non-pharmacologic comfort measures Outcome: Progressing   Problem: Health Behavior/Discharge Planning: Goal: Ability to manage health-related needs will improve Outcome: Progressing   Problem: Clinical Measurements: Goal: Ability to maintain clinical measurements within normal limits will improve Outcome: Progressing Goal: Will remain free from infection Outcome: Progressing Goal: Diagnostic test results will improve Outcome: Progressing Goal: Respiratory complications will improve Outcome: Progressing Goal: Cardiovascular complication will be avoided Outcome: Progressing   Problem: Activity: Goal: Risk for activity intolerance will decrease Outcome: Progressing   Problem: Nutrition: Goal: Adequate nutrition will be maintained Outcome: Progressing   Problem: Elimination: Goal: Will not experience complications related to bowel motility Outcome: Progressing Goal: Will not experience complications related to urinary retention Outcome: Progressing   Problem: Pain Managment: Goal: General experience of comfort will improve Outcome: Progressing   Problem: Safety: Goal: Ability to remain free from injury will improve Outcome: Progressing   Problem: Skin Integrity: Goal: Risk for impaired skin integrity will decrease Outcome: Progressing

## 2023-09-06 NOTE — Discharge Summary (Signed)
Physician Discharge Summary  Jean Gomez HYQ:[id number] DOB: 1956/05/05 DOA: 08/30/2023  PCP: Sherron Monday, MD  Admit date: 08/30/2023 Discharge date: 09/06/2023  Admitted From: home  Disposition:  home   Recommendations for Outpatient Follow-up:  Follow up with PCP in 1-2 weeks F/u w/ GI, Dr. Mia Creek, in 3-4 weeks   Home Health: no  Equipment/Devices:  Discharge Condition: stable  CODE STATUS: full   Diet recommendation: Heart Healthy  Brief/Interim Summary: HPI was taken from Dr. Alvester Morin: Jean Gomez is a 67 y.o. female with medical history significant of sarcoidosis, chronic pain, chronic respiratory failure on 2 L, depression, hyperlipidemia, seizure disorder presenting with sepsis, diarrhea.  Patient reports several weeks to months of recurring nonbilious nonbloody diarrhea.  Symptoms have progressively worsened especially over the past few weeks.  No fevers or chills.  Positive nausea mild generalized abdominal pain.  No chest pain.  Does have some shortness of breath and wheezing over the past week or so in setting of COPD.  Non-smoker.  Baseline sarcoidosis with chronic respiratory failure on 3 L.  Stable home oxygen use.  Has been using inhalers with minimal improvement in symptoms.  No reported rhinorrhea and nasal congestion. Presented to the ER Tmax nine 9.1, heart rate 100s, systolic blood pressure initially in the 80s however improved to the 120s with IV fluid resuscitation.  O2 sats stable at 100% on 3 L.  White count 24.8, hemoglobin 8.1, platelets 560.  Lactate 1.  Creatinine 1.54.  CT abdomen pelvis with fluid in right colon which may be indicative of acute diarrheal illness.  Discharge Diagnoses:  Principal Problem:   Diarrhea Active Problems:   Sepsis (HCC)   COPD (chronic obstructive pulmonary disease) (HCC)   Chronic respiratory failure with hypoxia (HCC)   HTN, goal below 140/90   History of seizure disorder   Hypercholesterolemia   Sarcoidosis    Multiple gastric ulcers   AKI (acute kidney injury) (HCC)   Anemia of chronic disease  Acute gastroenteritis: w/ diarrhea. GI PCR panel, c. diff were neg. Completed abx course. EGD in July 2024 showed gastric ulcers. No acute scopes needed currently as per GI. Advanced to soft diet today   Sepsis: met criteria w/ tachycardia, leukocytosis, transient hypotension & likely acute gastroenteritis. CT abd/pelvis shows fluid in the right colon. Continue on IV abxs.   COPD: completed steroid course. Continue on bronchodilators & encourage incentive spirometry.   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 No need for a transfusion currently. S/p 1 unit of pRBCs transfused so far    AKI: resolved    Multiple gastric ulcers: found on EGD in July 2023. Repeat EGD in Oct. Continue on PPI    Sarcoidosis: completed steroid course. Continue w/ supportive care    HLD: continue on statine    Hx of seizure disorder: continue on keppra   HTN: continue on amlodipine, irbesartan  Discharge Instructions  Discharge Instructions     Diet - low sodium heart healthy   Complete by: As directed    Discharge instructions   Complete by: As directed    F/u w/ PCP in 1-2 weeks. F/u w/ GI, Dr. Mia Creek, in 3-4 weeks   Increase activity slowly   Complete by: As directed       Allergies as of 09/06/2023       Reactions   Penicillins Rash, Hives, Itching  Contrast Media [iodinated Contrast Media] Swelling   Gabapentin    Labetalol Other (See Comments)   Made hair fall out   Lactose Intolerance (gi)    Bloating and diarrhea    Sulfabenzamide Nausea Only   Pregabalin Other (See Comments)   The patient complained of hemifacial numbness with the use of the medication.        Medication List     STOP taking these medications    clotrimazole-betamethasone cream Commonly  known as: LOTRISONE   omeprazole 20 MG capsule Commonly known as: PRILOSEC   omeprazole 20 MG tablet Commonly known as: PriLOSEC OTC   torsemide 20 MG tablet Commonly known as: Demadex   traZODone 100 MG tablet Commonly known as: DESYREL       TAKE these medications    Albuterol Sulfate 108 (90 Base) MCG/ACT Aepb Commonly known as: PROAIR RESPICLICK Inhale 2 puffs into the lungs every 6 (six) hours as needed.   albuterol (2.5 MG/3ML) 0.083% nebulizer solution Commonly known as: PROVENTIL Take 2.5 mg by nebulization every 6 (six) hours as needed for wheezing or shortness of breath.   amLODipine-olmesartan 5-40 MG tablet Commonly known as: AZOR TAKE 1 TABLET BY MOUTH EVERY MORNING   Arnuity Ellipta 100 MCG/ACT Aepb Generic drug: Fluticasone Furoate INHALE 1 INHALATION INTO THE LUNGS ONCE DAILY.   aspirin EC 81 MG tablet Take 1 tablet (81 mg total) by mouth daily.   atorvastatin 80 MG tablet Commonly known as: LIPITOR TAKE 1 TABLET BY MOUTH NIGHTLY   Belsomra 20 MG Tabs Generic drug: Suvorexant TAKE ONE TABLET BY MOUTH AT BEDTIME   calcium carbonate 1250 (500 Ca) MG chewable tablet Commonly known as: OS-CAL Chew by mouth daily.   citalopram 40 MG tablet Commonly known as: CELEXA TAKE ONE TABLET BY MOUTH EVERY MORNING   diclofenac Sodium 1 % Gel Commonly known as: VOLTAREN Apply 4 g topically 4 (four) times daily as needed (Painful Joints).   dicyclomine 20 MG tablet Commonly known as: BENTYL Take 1 tablet (20 mg total) by mouth every 6 (six) hours as needed for up to 7 days for spasms.   diphenhydrAMINE 25 MG tablet Commonly known as: BENADRYL Take 25 mg by mouth every 6 (six) hours as needed.   donepezil 10 MG tablet Commonly known as: ARICEPT TAKE ONE TABLET BY MOUTH EVERY MORNING   hydrochlorothiazide 25 MG tablet Commonly known as: HYDRODIURIL TAKE 1 TABLET BY MOUTH EVERY MORNING   indomethacin 50 MG capsule Commonly known as: INDOCIN Take  50 mg by mouth 3 (three) times daily.   lactulose 10 GM/15ML solution Commonly known as: CHRONULAC Take 10 g by mouth 2 (two) times daily as needed for mild constipation.   levETIRAcetam 750 MG tablet Commonly known as: KEPPRA TAKE ONE TABLET BY MOUTH EVERY MORNING   Linzess 290 MCG Caps capsule Generic drug: linaclotide TAKE 1 CAPSULE BY MOUTH EVERY MORNING   Medical Compression Stockings Misc Please provide compression stockings   mirtazapine 15 MG tablet Commonly known as: REMERON TAKE ONE TABLET BY MOUTH AT BEDTIME   nitroGLYCERIN 0.4 MG SL tablet Commonly known as: NITROSTAT Place 0.4 mg under the tongue every 5 (five) minutes as needed for chest pain.   Oxycodone HCl 10 MG Tabs Take 10 mg by mouth 3 (three) times daily as needed.   pantoprazole 40 MG tablet Commonly known as: PROTONIX TAKE ONE TABLET BY MOUTH EVERY MORNING   potassium chloride SA 20 MEQ tablet Commonly known as: Jerene Dilling  TAKE ONE TABLET BY MOUTH EVERY MORNING   promethazine 25 MG tablet Commonly known as: PHENERGAN Take 1 tablet (25 mg total) by mouth every 6 (six) hours as needed for nausea or vomiting.   sucralfate 1 g tablet Commonly known as: CARAFATE TAKE ONE TABLET BY MOUTH FOUR TIMES DAILY BEFORE MEALS AND AT BEDTIME   tiZANidine 4 MG tablet Commonly known as: ZANAFLEX Take 1 tablet (4 mg total) by mouth 3 (three) times daily.        Follow-up Information     Locklear, Rossie Muskrat, MD Follow up.   Specialty: Gastroenterology Why: F/u in 3-4 weeks Contact information: [address] [place] [address] [phone]                Allergies  Allergen Reactions   Penicillins Rash, Hives and Itching   Contrast Media [Iodinated Contrast Media] Swelling   Gabapentin    Labetalol Other (See Comments)    Made hair fall out   Lactose Intolerance (Gi)     Bloating and diarrhea    Sulfabenzamide Nausea Only   Pregabalin Other (See Comments)    The patient  complained of hemifacial numbness with the use of the medication.    Consultations: GI  Procedures/Studies: DG Chest Port 1 View  Result Date: 08/30/2023 CLINICAL DATA:  Shortness of breath. EXAM: PORTABLE CHEST 1 VIEW COMPARISON:  12/30/2022 FINDINGS: Heart size and mediastinal contours are stable. Diffuse coarsened interstitial markings. Extensive bilateral upper lobe confluent scarring with upward retraction of the hila bilaterally. No superimposed pleural effusion, interstitial edema or airspace disease. Visualized osseous structures are unremarkable. IMPRESSION: 1. No acute findings. 2. Extensive bilateral upper lobe confluent scarring with upward retraction of the hila bilaterally. Electronically Signed   By: Signa Kell M.D.   On: 08/30/2023 13:56   CT ABDOMEN PELVIS WO CONTRAST  Result Date: 08/30/2023 CLINICAL DATA:  Abdominal pain EXAM: CT ABDOMEN AND PELVIS WITHOUT CONTRAST TECHNIQUE: Multidetector CT imaging of the abdomen and pelvis was performed following the standard protocol without IV contrast. RADIATION DOSE REDUCTION: This exam was performed according to the departmental dose-optimization program which includes automated exposure control, adjustment of the mA and/or kV according to patient size and/or use of iterative reconstruction technique. COMPARISON:  CT abdomen/pelvis 06/26/2023 FINDINGS: Lower chest: Relative hyperlucency in the right middle lobe as well as traction bronchiectasis and architectural distortion in the right lower lobe are unchanged. The imaged heart is unremarkable. Hepatobiliary: The liver is unremarkable. The gallbladder is surgically absent. There is no biliary ductal dilatation. Pancreas: Unremarkable. Spleen: Unremarkable. Adrenals/Urinary Tract: The adrenals are unremarkable The kidneys are unremarkable, with no focal lesion, stone, hydronephrosis, or hydroureter. The bladder is decompressed but grossly unremarkable. Stomach/Bowel: The stomach is  unremarkable. There is no evidence of bowel obstruction. There is no abnormal bowel wall thickening or inflammatory change. The appendix is normal. There is fluid in the right colon. Vascular/Lymphatic: There is mild calcified plaque in the nonaneurysmal abdominal aorta. There are few prominent retroperitoneal lymph nodes measuring up to 1.0 cm at the level of the aortic bifurcation, nonspecific but not significantly changed since 2020 and favored reactive. There is no new or progressive abdominopelvic lymphadenopathy. Reproductive: The uterus and adnexa are unremarkable. Other: There is no ascites or free air. Musculoskeletal: There is no acute osseous abnormality or suspicious osseous lesion. IMPRESSION: Fluid in the right colon may reflect a diarrheal illness. Otherwise, no acute findings in the abdomen or pelvis. Electronically Signed   By: Theron Arista  Noone M.D.   On: 08/30/2023 12:22   (Echo, Carotid, EGD, Colonoscopy, ERCP)    Subjective: Pt c/o fatigue    Discharge Exam: Vitals:   09/05/23 2350 09/06/23 0809  BP: 136/79 139/87  Pulse: (!) 101 83  Resp: 16 19  Temp: 98.3 F (36.8 C) 99 F (37.2 C)  SpO2: 95% 100%   Vitals:   09/05/23 0422 09/05/23 0912 09/05/23 2350 09/06/23 0809  BP:  136/88 136/79 139/87  Pulse:  100 (!) 101 83  Resp:  16 16 19   Temp:  98.9 F (37.2 C) 98.3 F (36.8 C) 99 F (37.2 C)  TempSrc:   Oral   SpO2:  100% 95% 100%  Weight: 81.9 kg     Height:        General: Pt is alert, awake, not in acute distress Cardiovascular:  S1/S2 +, no rubs, no gallops Respiratory: CTA bilaterally, no wheezing, no rhonchi Abdominal: Soft, NT,obese, bowel sounds + Extremities: no edema, no cyanosis    The results of significant diagnostics from this hospitalization (including imaging, microbiology, ancillary and laboratory) are listed below for reference.     Microbiology: Recent Results (from the past 240 hour(s))  Culture, blood (Routine x 2)     Status: None    Collection Time: 08/30/23 11:11 AM   Specimen: BLOOD  Result Value Ref Range Status   Specimen Description BLOOD BLOOD LEFT HAND  Final   Special Requests   Final    BOTTLES DRAWN AEROBIC AND ANAEROBIC Blood Culture results may not be optimal due to an inadequate volume of blood received in culture bottles   Culture   Final    NO GROWTH 5 DAYS Performed at [facility], [address], [place], [place] [address]    Report Status 09/04/2023 FINAL  Final  SARS Coronavirus 2 by RT PCR (hospital order, performed in [facility] hospital lab) *cepheid single result test* Urine, Catheterized     Status: None   Collection Time: 08/30/23 11:11 AM   Specimen: Urine, Catheterized; Nasal Swab  Result Value Ref Range Status   SARS Coronavirus 2 by RT PCR NEGATIVE NEGATIVE Final    Comment: (NOTE) SARS-CoV-2 target nucleic acids are NOT DETECTED.  The SARS-CoV-2 RNA is generally detectable in upper and lower respiratory specimens during the acute phase of infection. The lowest concentration of SARS-CoV-2 viral copies this assay can detect is 250 copies / mL. A negative result does not preclude SARS-CoV-2 infection and should not be used as the sole basis for treatment or other patient management decisions.  A negative result may occur with improper specimen collection / handling, submission of specimen other than nasopharyngeal swab, presence of viral mutation(s) within the areas targeted by this assay, and inadequate number of viral copies (<250 copies / mL). A negative result must be combined with clinical observations, patient history, and epidemiological information.  Fact Sheet for Patients:   RoadLapTop.co.za  Fact Sheet for Healthcare Providers: http://kim-miller.com/  This test is not yet approved or  cleared by the [place] FDA and has been authorized for detection and/or diagnosis of SARS-CoV-2 by FDA under an Emergency Use  Authorization (EUA).  This EUA will remain in effect (meaning this test can be used) for the duration of the COVID-19 declaration under Section 564(b)(1) of the Act, 21 U.S.C. section 360bbb-3(b)(1), unless the authorization is terminated or revoked sooner.  Performed at [facility], [address] [place], [place] [address]   Culture, blood (Routine x  2)     Status: None   Collection Time: 08/30/23 11:31 AM   Specimen: BLOOD  Result Value Ref Range Status   Specimen Description BLOOD BLOOD LEFT ARM  Final   Special Requests   Final    BOTTLES DRAWN AEROBIC AND ANAEROBIC Blood Culture adequate volume   Culture   Final    NO GROWTH 5 DAYS Performed at [facility], [address], [place], [place] [address]    Report Status 09/04/2023 FINAL  Final  C Difficile Quick Screen w PCR reflex     Status: None   Collection Time: 08/30/23  3:13 PM   Specimen: STOOL  Result Value Ref Range Status   C Diff antigen NEGATIVE NEGATIVE Final   C Diff toxin NEGATIVE NEGATIVE Final   C Diff interpretation No C. difficile detected.  Final    Comment: Performed at [facility], [address] [place], [place] [address]  Gastrointestinal Panel by PCR , Stool     Status: None   Collection Time: 08/30/23  3:13 PM   Specimen: STOOL  Result Value Ref Range Status   Campylobacter species NOT DETECTED NOT DETECTED Final   Plesimonas shigelloides NOT DETECTED NOT DETECTED Final   Salmonella species NOT DETECTED NOT DETECTED Final   Yersinia enterocolitica NOT DETECTED NOT DETECTED Final   Vibrio species NOT DETECTED NOT DETECTED Final   Vibrio cholerae NOT DETECTED NOT DETECTED Final   Enteroaggregative E coli (EAEC) NOT DETECTED NOT DETECTED Final   Enteropathogenic E coli (EPEC) NOT DETECTED NOT DETECTED Final   Enterotoxigenic E coli (ETEC) NOT DETECTED NOT DETECTED Final   Shiga like toxin producing E coli (STEC) NOT DETECTED NOT DETECTED Final    Shigella/Enteroinvasive E coli (EIEC) NOT DETECTED NOT DETECTED Final   Cryptosporidium NOT DETECTED NOT DETECTED Final   Cyclospora cayetanensis NOT DETECTED NOT DETECTED Final   Entamoeba histolytica NOT DETECTED NOT DETECTED Final   Giardia lamblia NOT DETECTED NOT DETECTED Final   Adenovirus F40/41 NOT DETECTED NOT DETECTED Final   Astrovirus NOT DETECTED NOT DETECTED Final   Norovirus GI/GII NOT DETECTED NOT DETECTED Final   Rotavirus A NOT DETECTED NOT DETECTED Final   Sapovirus (I, II, IV, and V) NOT DETECTED NOT DETECTED Final    Comment: Performed at [facility], [address] [place], [place] [address]     Labs: BNP (last 3 results) No results for input(s): "BNP" in the last 8760 hours. Basic Metabolic Panel: Recent Labs  Lab 09/01/23 0517 09/02/23 1117 09/03/23 0708 09/04/23 0435 09/05/23 0324 09/06/23 0522  NA  --  138 139 138 140 140  K  --  3.6 3.3* 4.0 3.4* 3.3*  CL  --  103 104 102 98 99  CO2  --  24 26 27 27 30   GLUCOSE  --  96 83 78 78 85  BUN  --  9 7* <5* <5* <5*  CREATININE  --  0.65 0.61 0.69 0.66 0.65  CALCIUM  --  8.9 8.6* 8.8* 8.9 8.3*  MG 1.7 1.9 1.5* 2.2  --   --   PHOS 2.6 2.4* 2.6  --   --   --    Liver Function Tests: Recent Labs  Lab 08/30/23 1131  AST 19  ALT 16  ALKPHOS 123  BILITOT 0.3  PROT 7.0  ALBUMIN 2.7*   No results for input(s): "LIPASE", "AMYLASE" in the last 168 hours. No results for input(s): "AMMONIA" in the last 168  hours. CBC: Recent Labs  Lab 09/01/23 0517 09/02/23 1117 09/03/23 0708 09/04/23 0435 09/05/23 0324 09/06/23 0522  WBC 22.6* 23.4* 16.2* 16.8* 17.2* 14.0*  NEUTROABS 19.5* 18.2* 11.9* 12.3* 12.5*  --   HGB 7.0* 10.2* 9.5* 9.4* 9.2* 9.7*  HCT 22.1* 33.3* 29.8* 30.3* 29.0* 30.9*  MCV 74.7* 78.9* 77.4* 77.7* 76.9* 78.4*  PLT 497* 543* 523* 567* 580* 617*   Cardiac Enzymes: No results for input(s): "CKTOTAL", "CKMB", "CKMBINDEX", "TROPONINI" in the last 168 hours. BNP: Invalid  input(s): "POCBNP" CBG: No results for input(s): "GLUCAP" in the last 168 hours. D-Dimer No results for input(s): "DDIMER" in the last 72 hours. Hgb A1c No results for input(s): "HGBA1C" in the last 72 hours. Lipid Profile No results for input(s): "CHOL", "HDL", "LDLCALC", "TRIG", "CHOLHDL", "LDLDIRECT" in the last 72 hours. Thyroid function studies No results for input(s): "TSH", "T4TOTAL", "T3FREE", "THYROIDAB" in the last 72 hours.  Invalid input(s): "FREET3" Anemia work up No results for input(s): "VITAMINB12", "FOLATE", "FERRITIN", "TIBC", "IRON", "RETICCTPCT" in the last 72 hours. Urinalysis    Component Value Date/Time   COLORURINE YELLOW (A) 08/30/2023 1111   APPEARANCEUR HAZY (A) 08/30/2023 1111   LABSPEC 1.023 08/30/2023 1111   PHURINE 5.0 08/30/2023 1111   GLUCOSEU NEGATIVE 08/30/2023 1111   HGBUR NEGATIVE 08/30/2023 1111   BILIRUBINUR NEGATIVE 08/30/2023 1111   KETONESUR NEGATIVE 08/30/2023 1111   PROTEINUR NEGATIVE 08/30/2023 1111   NITRITE NEGATIVE 08/30/2023 1111   LEUKOCYTESUR TRACE (A) 08/30/2023 1111   Sepsis Labs Recent Labs  Lab 09/03/23 0708 09/04/23 0435 09/05/23 0324 09/06/23 0522  WBC 16.2* 16.8* 17.2* 14.0*   Microbiology Recent Results (from the past 240 hour(s))  Culture, blood (Routine x 2)     Status: None   Collection Time: 08/30/23 11:11 AM   Specimen: BLOOD  Result Value Ref Range Status   Specimen Description BLOOD BLOOD LEFT HAND  Final   Special Requests   Final    BOTTLES DRAWN AEROBIC AND ANAEROBIC Blood Culture results may not be optimal due to an inadequate volume of blood received in culture bottles   Culture   Final    NO GROWTH 5 DAYS Performed at [facility], [address], [place], [place] [address]    Report Status 09/04/2023 FINAL  Final  SARS Coronavirus 2 by RT PCR (hospital order, performed in [facility] hospital lab) *cepheid single result test* Urine, Catheterized     Status: None   Collection Time:  08/30/23 11:11 AM   Specimen: Urine, Catheterized; Nasal Swab  Result Value Ref Range Status   SARS Coronavirus 2 by RT PCR NEGATIVE NEGATIVE Final    Comment: (NOTE) SARS-CoV-2 target nucleic acids are NOT DETECTED.  The SARS-CoV-2 RNA is generally detectable in upper and lower respiratory specimens during the acute phase of infection. The lowest concentration of SARS-CoV-2 viral copies this assay can detect is 250 copies / mL. A negative result does not preclude SARS-CoV-2 infection and should not be used as the sole basis for treatment or other patient management decisions.  A negative result may occur with improper specimen collection / handling, submission of specimen other than nasopharyngeal swab, presence of viral mutation(s) within the areas targeted by this assay, and inadequate number of viral copies (<250 copies / mL). A negative result must be combined with clinical observations, patient history, and epidemiological information.  Fact Sheet for Patients:   RoadLapTop.co.za  Fact Sheet for Healthcare Providers: http://kim-miller.com/  This test is not yet approved or  cleared by the [place] and has been authorized for detection and/or diagnosis of SARS-CoV-2 by FDA under an Emergency Use Authorization (EUA).  This EUA will remain in effect (meaning this test can be used) for the duration of the COVID-19 declaration under Section 564(b)(1) of the Act, 21 U.S.C. section 360bbb-3(b)(1), unless the authorization is terminated or revoked sooner.  Performed at [facility], [address] [place], [place] [address]   Culture, blood (Routine x 2)     Status: None   Collection Time: 08/30/23 11:31 AM   Specimen: BLOOD  Result Value Ref Range Status   Specimen Description BLOOD BLOOD LEFT ARM  Final   Special Requests   Final    BOTTLES DRAWN AEROBIC AND ANAEROBIC Blood Culture adequate volume   Culture    Final    NO GROWTH 5 DAYS Performed at [facility], [address], [place], [place] [address]    Report Status 09/04/2023 FINAL  Final  C Difficile Quick Screen w PCR reflex     Status: None   Collection Time: 08/30/23  3:13 PM   Specimen: STOOL  Result Value Ref Range Status   C Diff antigen NEGATIVE NEGATIVE Final   C Diff toxin NEGATIVE NEGATIVE Final   C Diff interpretation No C. difficile detected.  Final    Comment: Performed at [facility], [address] [place], [place] [address]  Gastrointestinal Panel by PCR , Stool     Status: None   Collection Time: 08/30/23  3:13 PM   Specimen: STOOL  Result Value Ref Range Status   Campylobacter species NOT DETECTED NOT DETECTED Final   Plesimonas shigelloides NOT DETECTED NOT DETECTED Final   Salmonella species NOT DETECTED NOT DETECTED Final   Yersinia enterocolitica NOT DETECTED NOT DETECTED Final   Vibrio species NOT DETECTED NOT DETECTED Final   Vibrio cholerae NOT DETECTED NOT DETECTED Final   Enteroaggregative E coli (EAEC) NOT DETECTED NOT DETECTED Final   Enteropathogenic E coli (EPEC) NOT DETECTED NOT DETECTED Final   Enterotoxigenic E coli (ETEC) NOT DETECTED NOT DETECTED Final   Shiga like toxin producing E coli (STEC) NOT DETECTED NOT DETECTED Final   Shigella/Enteroinvasive E coli (EIEC) NOT DETECTED NOT DETECTED Final   Cryptosporidium NOT DETECTED NOT DETECTED Final   Cyclospora cayetanensis NOT DETECTED NOT DETECTED Final   Entamoeba histolytica NOT DETECTED NOT DETECTED Final   Giardia lamblia NOT DETECTED NOT DETECTED Final   Adenovirus F40/41 NOT DETECTED NOT DETECTED Final   Astrovirus NOT DETECTED NOT DETECTED Final   Norovirus GI/GII NOT DETECTED NOT DETECTED Final   Rotavirus A NOT DETECTED NOT DETECTED Final   Sapovirus (I, II, IV, and V) NOT DETECTED NOT DETECTED Final    Comment: Performed at [facility], [address], [place], [place] [address]     Time  coordinating discharge: Over 30 minutes  SIGNED:   Charise Killian, MD  [facility] 09/06/2023, 10:49 AM Pager   If 7PM-7AM, please contact night-coverage

## 2023-09-07 ENCOUNTER — Other Ambulatory Visit: Payer: Self-pay | Admitting: Internal Medicine

## 2023-09-12 DIAGNOSIS — M545 Low back pain, unspecified: Secondary | ICD-10-CM | POA: Diagnosis not present

## 2023-09-12 DIAGNOSIS — M25561 Pain in right knee: Secondary | ICD-10-CM | POA: Diagnosis not present

## 2023-09-12 DIAGNOSIS — J449 Chronic obstructive pulmonary disease, unspecified: Secondary | ICD-10-CM | POA: Diagnosis not present

## 2023-09-12 DIAGNOSIS — J9611 Chronic respiratory failure with hypoxia: Secondary | ICD-10-CM | POA: Diagnosis not present

## 2023-09-12 DIAGNOSIS — G894 Chronic pain syndrome: Secondary | ICD-10-CM | POA: Diagnosis not present

## 2023-09-12 DIAGNOSIS — M25562 Pain in left knee: Secondary | ICD-10-CM | POA: Diagnosis not present

## 2023-09-12 DIAGNOSIS — M7062 Trochanteric bursitis, left hip: Secondary | ICD-10-CM | POA: Diagnosis not present

## 2023-09-12 DIAGNOSIS — M25511 Pain in right shoulder: Secondary | ICD-10-CM | POA: Diagnosis not present

## 2023-09-18 ENCOUNTER — Other Ambulatory Visit: Payer: Self-pay | Admitting: Internal Medicine

## 2023-09-18 DIAGNOSIS — F5101 Primary insomnia: Secondary | ICD-10-CM

## 2023-09-19 DIAGNOSIS — J449 Chronic obstructive pulmonary disease, unspecified: Secondary | ICD-10-CM | POA: Diagnosis not present

## 2023-09-20 ENCOUNTER — Other Ambulatory Visit: Payer: Self-pay | Admitting: Internal Medicine

## 2023-09-27 ENCOUNTER — Other Ambulatory Visit: Payer: Self-pay | Admitting: Internal Medicine

## 2023-09-27 DIAGNOSIS — J449 Chronic obstructive pulmonary disease, unspecified: Secondary | ICD-10-CM | POA: Diagnosis not present

## 2023-09-29 ENCOUNTER — Inpatient Hospital Stay
Admission: EM | Admit: 2023-09-29 | Discharge: 2023-10-03 | DRG: 377 | Disposition: A | Discharge status: Home / Self Care | Payer: 59 | Attending: Internal Medicine | Admitting: Internal Medicine

## 2023-09-29 ENCOUNTER — Other Ambulatory Visit: Payer: Self-pay

## 2023-09-29 ENCOUNTER — Emergency Department: Payer: 59

## 2023-09-29 DIAGNOSIS — Z9981 Dependence on supplemental oxygen: Secondary | ICD-10-CM | POA: Diagnosis not present

## 2023-09-29 DIAGNOSIS — Z6831 Body mass index (BMI) 31.0-31.9, adult: Secondary | ICD-10-CM | POA: Diagnosis not present

## 2023-09-29 DIAGNOSIS — N179 Acute kidney failure, unspecified: Secondary | ICD-10-CM

## 2023-09-29 DIAGNOSIS — E861 Hypovolemia: Secondary | ICD-10-CM | POA: Diagnosis present

## 2023-09-29 DIAGNOSIS — Z91041 Radiographic dye allergy status: Secondary | ICD-10-CM

## 2023-09-29 DIAGNOSIS — Z90710 Acquired absence of both cervix and uterus: Secondary | ICD-10-CM

## 2023-09-29 DIAGNOSIS — Z1152 Encounter for screening for COVID-19: Secondary | ICD-10-CM | POA: Diagnosis not present

## 2023-09-29 DIAGNOSIS — E669 Obesity, unspecified: Secondary | ICD-10-CM | POA: Diagnosis present

## 2023-09-29 DIAGNOSIS — K219 Gastro-esophageal reflux disease without esophagitis: Secondary | ICD-10-CM | POA: Diagnosis not present

## 2023-09-29 DIAGNOSIS — Z880 Allergy status to penicillin: Secondary | ICD-10-CM

## 2023-09-29 DIAGNOSIS — R296 Repeated falls: Secondary | ICD-10-CM | POA: Diagnosis not present

## 2023-09-29 DIAGNOSIS — J9612 Chronic respiratory failure with hypercapnia: Secondary | ICD-10-CM | POA: Diagnosis not present

## 2023-09-29 DIAGNOSIS — Z888 Allergy status to other drugs, medicaments and biological substances status: Secondary | ICD-10-CM

## 2023-09-29 DIAGNOSIS — Z79899 Other long term (current) drug therapy: Secondary | ICD-10-CM

## 2023-09-29 DIAGNOSIS — D869 Sarcoidosis, unspecified: Secondary | ICD-10-CM | POA: Diagnosis not present

## 2023-09-29 DIAGNOSIS — E7800 Pure hypercholesterolemia, unspecified: Secondary | ICD-10-CM | POA: Diagnosis not present

## 2023-09-29 DIAGNOSIS — K922 Gastrointestinal hemorrhage, unspecified: Secondary | ICD-10-CM | POA: Diagnosis present

## 2023-09-29 DIAGNOSIS — Z9049 Acquired absence of other specified parts of digestive tract: Secondary | ICD-10-CM | POA: Diagnosis not present

## 2023-09-29 DIAGNOSIS — E860 Dehydration: Secondary | ICD-10-CM | POA: Diagnosis present

## 2023-09-29 DIAGNOSIS — Z8679 Personal history of other diseases of the circulatory system: Secondary | ICD-10-CM

## 2023-09-29 DIAGNOSIS — Z803 Family history of malignant neoplasm of breast: Secondary | ICD-10-CM

## 2023-09-29 DIAGNOSIS — J4489 Other specified chronic obstructive pulmonary disease: Secondary | ICD-10-CM | POA: Diagnosis present

## 2023-09-29 DIAGNOSIS — G40909 Epilepsy, unspecified, not intractable, without status epilepticus: Secondary | ICD-10-CM | POA: Diagnosis present

## 2023-09-29 DIAGNOSIS — Z882 Allergy status to sulfonamides status: Secondary | ICD-10-CM

## 2023-09-29 DIAGNOSIS — D649 Anemia, unspecified: Secondary | ICD-10-CM | POA: Diagnosis not present

## 2023-09-29 DIAGNOSIS — R0602 Shortness of breath: Secondary | ICD-10-CM | POA: Diagnosis not present

## 2023-09-29 DIAGNOSIS — K259 Gastric ulcer, unspecified as acute or chronic, without hemorrhage or perforation: Secondary | ICD-10-CM | POA: Diagnosis not present

## 2023-09-29 DIAGNOSIS — J9611 Chronic respiratory failure with hypoxia: Secondary | ICD-10-CM | POA: Diagnosis present

## 2023-09-29 DIAGNOSIS — D62 Acute posthemorrhagic anemia: Secondary | ICD-10-CM | POA: Diagnosis present

## 2023-09-29 DIAGNOSIS — Z86711 Personal history of pulmonary embolism: Secondary | ICD-10-CM | POA: Diagnosis not present

## 2023-09-29 DIAGNOSIS — K2950 Unspecified chronic gastritis without bleeding: Secondary | ICD-10-CM | POA: Diagnosis present

## 2023-09-29 DIAGNOSIS — K253 Acute gastric ulcer without hemorrhage or perforation: Secondary | ICD-10-CM

## 2023-09-29 DIAGNOSIS — K254 Chronic or unspecified gastric ulcer with hemorrhage: Principal | ICD-10-CM | POA: Diagnosis present

## 2023-09-29 DIAGNOSIS — J479 Bronchiectasis, uncomplicated: Secondary | ICD-10-CM | POA: Diagnosis not present

## 2023-09-29 DIAGNOSIS — I10 Essential (primary) hypertension: Secondary | ICD-10-CM | POA: Diagnosis present

## 2023-09-29 DIAGNOSIS — Z91011 Allergy to milk products: Secondary | ICD-10-CM

## 2023-09-29 DIAGNOSIS — R6520 Severe sepsis without septic shock: Secondary | ICD-10-CM | POA: Diagnosis not present

## 2023-09-29 DIAGNOSIS — R197 Diarrhea, unspecified: Secondary | ICD-10-CM | POA: Diagnosis present

## 2023-09-29 DIAGNOSIS — R739 Hyperglycemia, unspecified: Secondary | ICD-10-CM | POA: Diagnosis not present

## 2023-09-29 DIAGNOSIS — R531 Weakness: Secondary | ICD-10-CM | POA: Diagnosis not present

## 2023-09-29 DIAGNOSIS — I959 Hypotension, unspecified: Secondary | ICD-10-CM | POA: Diagnosis not present

## 2023-09-29 DIAGNOSIS — N170 Acute kidney failure with tubular necrosis: Secondary | ICD-10-CM | POA: Diagnosis present

## 2023-09-29 DIAGNOSIS — K449 Diaphragmatic hernia without obstruction or gangrene: Secondary | ICD-10-CM | POA: Diagnosis present

## 2023-09-29 DIAGNOSIS — Z981 Arthrodesis status: Secondary | ICD-10-CM

## 2023-09-29 DIAGNOSIS — A419 Sepsis, unspecified organism: Principal | ICD-10-CM

## 2023-09-29 DIAGNOSIS — Z7951 Long term (current) use of inhaled steroids: Secondary | ICD-10-CM

## 2023-09-29 DIAGNOSIS — Z808 Family history of malignant neoplasm of other organs or systems: Secondary | ICD-10-CM

## 2023-09-29 DIAGNOSIS — K5909 Other constipation: Secondary | ICD-10-CM | POA: Diagnosis present

## 2023-09-29 DIAGNOSIS — G894 Chronic pain syndrome: Secondary | ICD-10-CM | POA: Diagnosis present

## 2023-09-29 DIAGNOSIS — Z818 Family history of other mental and behavioral disorders: Secondary | ICD-10-CM

## 2023-09-29 DIAGNOSIS — Z7982 Long term (current) use of aspirin: Secondary | ICD-10-CM

## 2023-09-29 DIAGNOSIS — G4733 Obstructive sleep apnea (adult) (pediatric): Secondary | ICD-10-CM | POA: Diagnosis not present

## 2023-09-29 DIAGNOSIS — R000 Tachycardia, unspecified: Secondary | ICD-10-CM | POA: Diagnosis not present

## 2023-09-29 DIAGNOSIS — I9589 Other hypotension: Secondary | ICD-10-CM | POA: Diagnosis not present

## 2023-09-29 DIAGNOSIS — K5900 Constipation, unspecified: Secondary | ICD-10-CM | POA: Diagnosis not present

## 2023-09-29 DIAGNOSIS — Z8719 Personal history of other diseases of the digestive system: Secondary | ICD-10-CM | POA: Diagnosis not present

## 2023-09-29 DIAGNOSIS — I509 Heart failure, unspecified: Secondary | ICD-10-CM | POA: Diagnosis not present

## 2023-09-29 LAB — URINALYSIS, W/ REFLEX TO CULTURE (INFECTION SUSPECTED)
Bilirubin Urine: NEGATIVE
Glucose, UA: NEGATIVE mg/dL
Ketones, ur: NEGATIVE mg/dL
Leukocytes,Ua: NEGATIVE
Nitrite: NEGATIVE
Protein, ur: NEGATIVE mg/dL
RBC / HPF: 0 RBC/hpf (ref 0–5)
Specific Gravity, Urine: 1.009 (ref 1.005–1.030)
pH: 5 (ref 5.0–8.0)

## 2023-09-29 LAB — CBC
HCT: 25.6 % — ABNORMAL LOW (ref 36.0–46.0)
Hemoglobin: 7.9 g/dL — ABNORMAL LOW (ref 12.0–15.0)
MCH: 23.6 pg — ABNORMAL LOW (ref 26.0–34.0)
MCHC: 30.9 g/dL (ref 30.0–36.0)
MCV: 76.4 fL — ABNORMAL LOW (ref 80.0–100.0)
Platelets: 577 10*3/uL — ABNORMAL HIGH (ref 150–400)
RBC: 3.35 MIL/uL — ABNORMAL LOW (ref 3.87–5.11)
RDW: 20.4 % — ABNORMAL HIGH (ref 11.5–15.5)
WBC: 24.9 10*3/uL — ABNORMAL HIGH (ref 4.0–10.5)
nRBC: 0 % (ref 0.0–0.2)

## 2023-09-29 LAB — BASIC METABOLIC PANEL
Anion gap: 11 (ref 5–15)
BUN: 29 mg/dL — ABNORMAL HIGH (ref 8–23)
CO2: 22 mmol/L (ref 22–32)
Calcium: 8.5 mg/dL — ABNORMAL LOW (ref 8.9–10.3)
Chloride: 104 mmol/L (ref 98–111)
Creatinine, Ser: 1.91 mg/dL — ABNORMAL HIGH (ref 0.44–1.00)
GFR, Estimated: 28 mL/min — ABNORMAL LOW (ref >60)
Glucose, Bld: 175 mg/dL — ABNORMAL HIGH (ref 70–99)
Potassium: 4.3 mmol/L (ref 3.5–5.1)
Sodium: 137 mmol/L (ref 135–145)

## 2023-09-29 LAB — LACTIC ACID, PLASMA
Lactic Acid, Venous: 2.6 mmol/L (ref 0.5–1.9)
Lactic Acid, Venous: 1.5 mmol/L (ref 0.5–1.9)

## 2023-09-29 LAB — RESP PANEL BY RT-PCR (RSV, FLU A&B, COVID)  RVPGX2
Influenza A by PCR: NEGATIVE
Influenza B by PCR: NEGATIVE
Resp Syncytial Virus by PCR: NEGATIVE
SARS Coronavirus 2 by RT PCR: NEGATIVE

## 2023-09-29 LAB — CBG MONITORING, ED
Glucose-Capillary: 151 mg/dL — ABNORMAL HIGH (ref 70–99)
Glucose-Capillary: 94 mg/dL (ref 70–99)

## 2023-09-29 LAB — PROCALCITONIN: Procalcitonin: 0.49 ng/mL

## 2023-09-29 LAB — PREPARE RBC (CROSSMATCH)

## 2023-09-29 MED ORDER — PANTOPRAZOLE INFUSION (NEW) - SIMPLE MED
8.0000 mg/h | INTRAVENOUS | Status: DC
  Administered 2023-09-29 – 2023-10-02 (×6): 8 mg/h via INTRAVENOUS
  Filled 2023-09-29 (×6): qty 100

## 2023-09-29 MED ORDER — PANTOPRAZOLE SODIUM 40 MG IV SOLR
40.0000 mg | Freq: Once | INTRAVENOUS | Status: AC
  Administered 2023-09-29: 40 mg via INTRAVENOUS
  Filled 2023-09-29: qty 10

## 2023-09-29 MED ORDER — SODIUM CHLORIDE 0.9 % IV SOLN
2.0000 g | INTRAVENOUS | Status: DC
  Administered 2023-09-29: 2 g via INTRAVENOUS
  Filled 2023-09-29: qty 20

## 2023-09-29 MED ORDER — SODIUM CHLORIDE 0.9 % IV BOLUS
1000.0000 mL | Freq: Once | INTRAVENOUS | Status: AC
  Administered 2023-09-29: 1000 mL via INTRAVENOUS

## 2023-09-29 MED ORDER — DONEPEZIL HCL 5 MG PO TABS
10.0000 mg | ORAL_TABLET | Freq: Every morning | ORAL | Status: DC
  Administered 2023-09-30 – 2023-10-03 (×4): 10 mg via ORAL
  Filled 2023-09-29 (×4): qty 2

## 2023-09-29 MED ORDER — SODIUM CHLORIDE 0.9 % IV SOLN: 250.0000 mL | INTRAVENOUS | Status: DC

## 2023-09-29 MED ORDER — SODIUM CHLORIDE 0.9 % IV SOLN
10.0000 mL/h | Freq: Once | INTRAVENOUS | Status: AC
  Administered 2023-09-29: 10 mL/h via INTRAVENOUS

## 2023-09-29 MED ORDER — MORPHINE SULFATE (PF) 2 MG/ML IV SOLN
2.0000 mg | INTRAVENOUS | Status: DC | PRN
  Administered 2023-09-30 – 2023-10-02 (×9): 2 mg via INTRAVENOUS
  Filled 2023-09-29 (×9): qty 1

## 2023-09-29 MED ORDER — CITALOPRAM HYDROBROMIDE 20 MG PO TABS
40.0000 mg | ORAL_TABLET | Freq: Every morning | ORAL | Status: DC
  Administered 2023-09-30 – 2023-10-03 (×4): 40 mg via ORAL
  Filled 2023-09-29 (×4): qty 2

## 2023-09-29 MED ORDER — NOREPINEPHRINE 4 MG/250ML-% IV SOLN: 2.0000 ug/min | INTRAVENOUS | Status: DC

## 2023-09-29 MED ORDER — IPRATROPIUM-ALBUTEROL 0.5-2.5 (3) MG/3ML IN SOLN: 3.0000 mL | Freq: Four times a day (QID) | RESPIRATORY_TRACT | Status: DC | PRN

## 2023-09-29 MED ORDER — LACTATED RINGERS IV SOLN: INTRAVENOUS | Status: AC

## 2023-09-29 MED ORDER — PANTOPRAZOLE SODIUM 40 MG IV SOLR
40.0000 mg | Freq: Once | INTRAVENOUS | Status: DC
Start: 2023-09-29 — End: 2023-09-29

## 2023-09-29 MED ORDER — PANTOPRAZOLE SODIUM 40 MG IV SOLR
40.0000 mg | Freq: Two times a day (BID) | INTRAVENOUS | Status: DC
Start: 2023-10-03 — End: 2023-09-29

## 2023-09-29 MED ORDER — IPRATROPIUM-ALBUTEROL 0.5-2.5 (3) MG/3ML IN SOLN
3.0000 mL | Freq: Four times a day (QID) | RESPIRATORY_TRACT | Status: DC
  Administered 2023-09-29 – 2023-09-30 (×3): 3 mL via RESPIRATORY_TRACT
  Filled 2023-09-29 (×3): qty 3

## 2023-09-29 MED ORDER — LEVETIRACETAM 750 MG PO TABS
750.0000 mg | ORAL_TABLET | Freq: Every morning | ORAL | Status: DC
  Filled 2023-09-29: qty 1

## 2023-09-29 MED ORDER — LEVOFLOXACIN IN D5W 750 MG/150ML IV SOLN
750.0000 mg | Freq: Once | INTRAVENOUS | Status: AC
  Administered 2023-09-29: 750 mg via INTRAVENOUS
  Filled 2023-09-29: qty 150

## 2023-09-29 MED ORDER — POLYETHYLENE GLYCOL 3350 17 G PO PACK: 17.0000 g | PACK | Freq: Every day | ORAL | Status: DC | PRN

## 2023-09-29 MED ORDER — DOCUSATE SODIUM 100 MG PO CAPS: 100.0000 mg | ORAL_CAPSULE | Freq: Two times a day (BID) | ORAL | Status: DC | PRN

## 2023-09-29 MED ORDER — ACETAMINOPHEN 500 MG PO TABS
1000.0000 mg | ORAL_TABLET | Freq: Once | ORAL | Status: AC
  Administered 2023-09-29: 1000 mg via ORAL
  Filled 2023-09-29: qty 2

## 2023-09-29 MED ORDER — SODIUM CHLORIDE 0.9 % IV SOLN: 2.0000 g | INTRAVENOUS | Status: DC

## 2023-09-29 MED ORDER — RISAQUAD PO CAPS
1.0000 | ORAL_CAPSULE | Freq: Three times a day (TID) | ORAL | Status: DC
  Administered 2023-09-30 – 2023-10-03 (×10): 1 via ORAL
  Filled 2023-09-29 (×10): qty 1

## 2023-09-29 MED ORDER — INSULIN ASPART 100 UNIT/ML IJ SOLN
0.0000 [IU] | INTRAMUSCULAR | Status: DC
  Administered 2023-09-29: 2 [IU] via SUBCUTANEOUS
  Filled 2023-09-29: qty 1

## 2023-09-29 MED ORDER — METRONIDAZOLE 500 MG/100ML IV SOLN
500.0000 mg | Freq: Once | INTRAVENOUS | Status: AC
  Administered 2023-09-29: 500 mg via INTRAVENOUS
  Filled 2023-09-29: qty 100

## 2023-09-29 NOTE — ED Provider Notes (Signed)
[facility] Provider Note    Event Date/Time   First MD Initiated Contact with Patient 09/29/23 1218     (approximate)   History   Weakness   HPI  Jean Gomez is a 67 y.o. female presents to the ER for evaluation of generalized weakness abdominal pain diarrheal illness for the past several weeks becoming more acutely worse today.  Has had some dark and tarry stools.  Does have a history of PUD and ulcers.  Was recently admitted for sepsis.  She is not currently on any antibiotics.  Recent GI stool studies were negative.     Physical Exam   Triage Vital Signs: ED Triage Vitals  Encounter Vitals Group     BP 09/29/23 1228 (!) 65/49     Systolic BP Percentile --      Diastolic BP Percentile --      Pulse --      Resp 09/29/23 1228 20     Temp 09/29/23 1228 98.6 F (37 C)     Temp Source 09/29/23 1228 Oral     SpO2 09/29/23 1228 99 %     Weight 09/29/23 1225 176 lb (79.8 kg)     Height 09/29/23 1225 5\' 3"  (1.6 m)     Head Circumference --      Peak Flow --      Pain Score 09/29/23 1220 9     Pain Loc --      Pain Education --      Exclude from Growth Chart --     Most recent vital signs: Vitals:   09/29/23 1315 09/29/23 1430  BP:  (!) 79/52  Pulse: 77 85  Resp: 14   Temp:    SpO2: 97% 95%     Constitutional: Alert, pale Eyes: Conjunctivae are normal.  Head: Atraumatic. Nose: No congestion/rhinnorhea. Mouth/Throat: Mucous membranes are moist.   Neck: Painless ROM.  Cardiovascular:   Good peripheral circulation. Respiratory: Normal respiratory effort.  No retractions.  Gastrointestinal: Soft with mild ttp diffusely, no guarding or rebound Musculoskeletal:  no deformity Neurologic:  MAE spontaneously. No gross focal neurologic deficits are appreciated.  Skin:  Skin is warm, dry and intact. No rash noted. Psychiatric: Mood and affect are normal. Speech and behavior are normal.    ED Results / Procedures / Treatments    Labs (all labs ordered are listed, but only abnormal results are displayed) Labs Reviewed  BASIC METABOLIC PANEL - Abnormal; Notable for the following components:      Result Value   Glucose, Bld 175 (*)    BUN 29 (*)    Creatinine, Ser 1.91 (*)    Calcium 8.5 (*)    GFR, Estimated 28 (*)    All other components within normal limits  CBC - Abnormal; Notable for the following components:   WBC 24.9 (*)    RBC 3.35 (*)    Hemoglobin 7.9 (*)    HCT 25.6 (*)    MCV 76.4 (*)    MCH 23.6 (*)    RDW 20.4 (*)    Platelets 577 (*)    All other components within normal limits  LACTIC ACID, PLASMA - Abnormal; Notable for the following components:   Lactic Acid, Venous 2.6 (*)    All other components within normal limits  RESP PANEL BY RT-PCR (RSV, FLU A&B, COVID)  [id number]  CULTURE, BLOOD (ROUTINE X 2)  CULTURE, BLOOD (ROUTINE X 2)  C DIFFICILE QUICK SCREEN W PCR  REFLEX    GASTROINTESTINAL PANEL BY PCR, STOOL (REPLACES STOOL CULTURE)  LACTIC ACID, PLASMA  COMPREHENSIVE METABOLIC PANEL  PROTIME-INR  APTT  URINALYSIS, W/ REFLEX TO CULTURE (INFECTION SUSPECTED)  TYPE AND SCREEN  PREPARE RBC (CROSSMATCH)     EKG  ED ECG REPORT I, Willy Eddy, the attending physician, personally viewed and interpreted this ECG.   Date: 09/29/2023  EKG Time: 12:32  Rate: 90  Rhythm: sinus  Axis: normal  Intervals: normal  ST&T Change: no stemi, no depressions    RADIOLOGY Please see ED Course for my review and interpretation.  I personally reviewed all radiographic images ordered to evaluate for the above acute complaints and reviewed radiology reports and findings.  These findings were personally discussed with the patient.  Please see medical record for radiology report.    PROCEDURES:  Critical Care performed: yes  .Critical Care  Performed by: Willy Eddy, MD Authorized by: Willy Eddy, MD   Critical care provider statement:    Critical care time (minutes):   40   Critical care was necessary to treat or prevent imminent or life-threatening deterioration of the following conditions:  Circulatory failure   Critical care was time spent personally by me on the following activities:  Ordering and performing treatments and interventions, ordering and review of laboratory studies, ordering and review of radiographic studies, pulse oximetry, re-evaluation of patient's condition, review of old charts, obtaining history from patient or surrogate, examination of patient, evaluation of patient's response to treatment, discussions with primary provider, discussions with consultants and development of treatment plan with patient or surrogate    MEDICATIONS ORDERED IN ED: Medications  metroNIDAZOLE (FLAGYL) IVPB 500 mg (500 mg Intravenous New Bag/Given 09/29/23 1508)  cefTRIAXone (ROCEPHIN) 2 g in sodium chloride 0.9 % 100 mL IVPB (has no administration in time range)  sodium chloride 0.9 % bolus 1,000 mL (has no administration in time range)  pantoprazole (PROTONIX) injection 40 mg (has no administration in time range)  0.9 %  sodium chloride infusion (has no administration in time range)  sodium chloride 0.9 % bolus 1,000 mL (0 mLs Intravenous Stopped 09/29/23 1459)  levofloxacin (LEVAQUIN) IVPB 750 mg (0 mg Intravenous Stopped 09/29/23 1445)  sodium chloride 0.9 % bolus 1,000 mL (1,000 mLs Intravenous New Bag/Given 09/29/23 1506)     IMPRESSION / MDM / ASSESSMENT AND PLAN / ED COURSE  I reviewed the triage vital signs and the nursing notes.                              Differential diagnosis includes, but is not limited to, sepsis, this, perforation, AKI, dehydration, electrolyte abnormality  Patient presenting to the ER for evaluation of symptoms as described above.  Based on symptoms, risk factors and considered above differential, this presenting complaint could reflect a potentially life-threatening illness therefore the patient will be placed on  continuous pulse oximetry and telemetry for monitoring.  Laboratory evaluation will be sent to evaluate for the above complaints.  Patient given IV fluids for her hypotension.  Noted leukocytosis concerning for sepsis broad-spectrum antibiotics ordered.  CT imaging will be ordered given her abdominal pain.    Clinical Course as of 09/29/23 1512  Sat Sep 29, 2023  1329 Blood pressure improved with IV fluids.  Lactate mildly elevated 2.6.  Mild AKI. [PR]  1423 CT on my review and interpretation without evidence of perforation by my review.  Will await formal radiology report. [  PR]  1452 Hypotensive again.  She has guaiac positive stool.  But no melena or hematochezia.  Will give additional IV fluids.  She is having episodes of hypotension will give a 1 unit blood transfusion as her hemoglobin is 7.9 down from baseline. [PR]  1505 Gust case in consultation with Dr. Tobi Bastos of GI.  In addition to interventions artery ordered at this time has also recommended repeating stool studies given recent dairrheal illness. [PR]  1511 Patient will require admission.  Patient will be signed out oncoming physician pending additional IV fluid bolus and transfusion to determine appropriate level care. [PR]    Clinical Course User Index [PR] Willy Eddy, MD     FINAL CLINICAL IMPRESSION(S) / ED DIAGNOSES   Final diagnoses:  Sepsis, due to unspecified organism, unspecified whether acute organ dysfunction present [facility])  AKI (acute kidney injury) (HCC)     Rx / DC Orders   ED Discharge Orders     None        Note:  This document was prepared using Dragon voice recognition software and may include unintentional dictation errors.    Willy Eddy, MD 09/29/23 [phone]

## 2023-09-29 NOTE — Hospital Course (Signed)
Abdominal pain Falls x 3 in one day. Dizzy. Had to hold on to something.   Diarrhea SOB- COPD  @ 3 L [place] at home.   Abdominal pain: September 18th and was in house and was treated ulcers. Pt has an appt upcoming on th 17 th of October.   Today pt was sob and was weak and dizzy and fell toeh r knees. Stomach was cramping.  Diarrhea since past 3 days since d/c.  Watery / pasty / yellowis no blood.

## 2023-09-29 NOTE — Consult Note (Signed)
PHARMACY CONSULT NOTE - ELECTROLYTES  Pharmacy Consult for Electrolyte Monitoring and Replacement   Recent Labs: Potassium (mmol/L)  Date Value  09/29/2023 4.3   Magnesium (mg/dL)  Date Value  40/98/1191 2.2   Calcium (mg/dL)  Date Value  47/82/9562 8.5 (L)   Albumin (g/dL)  Date Value  13/07/6577 2.7 (L)  05/21/2023 3.7 (L)   Phosphorus (mg/dL)  Date Value  46/96/2952 2.6   Sodium (mmol/L)  Date Value  09/29/2023 137  05/21/2023 141    Assessment  Jean Gomez is a 67 y.o. female presenting with GIB. PMH significant for HTN, chronic pain, GERD, epilepsy, HLD, COPD, OA. Pharmacy has been consulted to monitor and replace electrolytes.  Diet: NPO MIVF: LR @ 100 mL/hr  Goal of Therapy: Electrolytes WNL  Plan:  No replacement warranted Check BMP, Mg, Phos with AM labs  Thank you for allowing pharmacy to be a part of this patient's care.  Celene Squibb, PharmD Clinical Pharmacist 09/29/2023 8:31 PM

## 2023-09-29 NOTE — ED Notes (Signed)
1 set cultures was drawn at this time.

## 2023-09-29 NOTE — Sepsis Progress Note (Signed)
Sepsis protocol is being followed by [facility]. 

## 2023-09-29 NOTE — Consult Note (Cosign Needed)
NAME:  Jean Gomez, MRN:  [id number], DOB:  08-08-1956, LOS: 0 ADMISSION DATE:  09/29/2023, CONSULTATION DATE:  09/29/23 REFERRING MD:  Irena Cords  REASON FOR CONSULT:  Acute GI Bleed with Hypotension   HPI  67 y.o female with significant PMH of OSA on CPAP, COPD, asthma, chronic respiratory failure on 3 L at baseline, chronic low back pain, sarcoid, HTN, gastric ulcers s/p EGD 07/02/2023, seizures, cerebral aneurysm, HDL, depression, osteoarthritis who presented to the ED via EMS with chief complaints of progressive shortness of breath, generalized weakness, RLQ abdominal pain and intermittent diarrhea and constipation since 3 to 4 weeks.  Patient recently admitted to the hospital from 9/12 to 9/19 with acute gastroenteritis.  She was discharged to follow-up with her GI doctor who has scheduled an EGD for 10/12/2023.  Patient's last EGD was on 07/02/23 which showed nonbleeding gastric ulcers.   ED Course: Initial vital signs showed HR of 86 beats/minute, BP 65/49 mm Hg, the RR 21 breaths/minute, and the oxygen saturation 97% on 3L and a temperature of 98.32F (37C).  Pertinent Labs/Diagnostics Findings: Glucose: 175 BUN/Cr.:  29/1.91 WBC: 24.9 Hgb/Hct: Plts: 7.9/25.6 PCT: 0.49 Lactic acid: 2.6 COVID PCR: Negative,  CXR> negative for acute finding CT Abd/pelvis> negative  Patient given 30 cc/kg of fluids and started on broad-spectrum antibiotics Vanco cefepime and Flagyl for sepsis due to suspected intrabdominal source. Patient admitted to hospitalist service. PCCM consulted for possible pressor needs.  Past Medical History  OSA on CPAP, COPD, asthma, chronic respiratory failure on 3 L at baseline, chronic low back pain, sarcoid, HTN, gastric ulcers s/p EGD 07/02/2023, seizures, cerebral aneurysm, HDL, depression, osteoarthritis w  Significant Hospital Events   10/12: Admitted with symptomatic anemia secondary to acute GI bleed  Consults:  GI PCCM  Procedures:   None  Significant Diagnostic Tests:  10/12: Chest Xray> IMPRESSION: 1. No definite acute findings. Chronic lung changes, as above, similar to the prior study. These are nonspecific and could reflect areas of chronic post infectious or inflammatory scarring, or could be related to a systemic disease such as sarcoidosis.  10/12: CTA Chest, abdomen and pelvis> IMPRESSION: 1. No acute findings within the abdomen or pelvis. 2. Liquid and solid stool identified within the proximal and mid colon. This is a nonspecific finding but can be seen in the setting of diarrheal illness. 3. Prominent central mesenteric lymph nodes are identified measuring up to 1 cm. These are nonspecific and may be reactive. These appear unchanged compared with 08/30/2023. 4. Aortic Atherosclerosis (ICD10-I70.0) and Emphysema (ICD10-J43.9).  Interim History / Subjective:    -  Micro Data:  10/12: SARS-CoV-2 PCR> negative 10/12: Blood culture x2> 10/12: MRSA PCR>>  Antimicrobials:  Vancomycin 10/12 x 1 Cefepime 10/12 x 1 Metronidazole 10/12 x1 Zosyn 10/12>  OBJECTIVE  Blood pressure 118/82, pulse (!) 115, temperature 98.3 F (36.8 C), temperature source Oral, resp. rate 16, height 5\' 3"  (1.6 m), weight 79.8 kg, SpO2 99%.        Intake/Output Summary (Last 24 hours) at 09/29/2023 2217 Last data filed at 09/29/2023 1734 Gross per 24 hour  Intake 2050 ml  Output 300 ml  Net 1750 ml   Filed Weights   09/29/23 1225  Weight: 79.8 kg   Physical Examination  GENERAL: 67 year-old critically ill patient lying in the bed in no acute distress EYES: PEERLA. No scleral icterus. Extraocular muscles intact.  HEENT: Head atraumatic, normocephalic. Oropharynx and nasopharynx clear.  NECK:  No JVD, supple  LUNGS: Normal breath sounds bilaterally.  No use of accessory muscles of respiration.  CARDIOVASCULAR: S1, S2 normal. No murmurs, rubs, or gallops.  ABDOMEN: Soft, NTND EXTREMITIES: No swelling or  erythema.  Capillary refill < 3 seconds in all extremities. Pulses palpable distally. NEUROLOGIC: The patient is . No focal neurological deficit appreciated. Cranial nerves are intact.  SKIN: No obvious rash, lesion, or ulcer. Warm to touch Labs/imaging that I havepersonally reviewed  (right click and "Reselect all SmartList Selections" daily)     Labs   CBC: Recent Labs  Lab 09/29/23 1237  WBC 24.9*  HGB 7.9*  HCT 25.6*  MCV 76.4*  PLT 577*    Basic Metabolic Panel: Recent Labs  Lab 09/29/23 1237  NA 137  K 4.3  CL 104  CO2 22  GLUCOSE 175*  BUN 29*  CREATININE 1.91*  CALCIUM 8.5*   GFR: Estimated Creatinine Clearance: 28.6 mL/min (A) (by C-G formula based on SCr of 1.91 mg/dL (H)). Recent Labs  Lab 09/29/23 1237 09/29/23 1308  PROCALCITON 0.49  --   WBC 24.9*  --   LATICACIDVEN 2.6* 1.5    Liver Function Tests: No results for input(s): "AST", "ALT", "ALKPHOS", "BILITOT", "PROT", "ALBUMIN" in the last 168 hours. No results for input(s): "LIPASE", "AMYLASE" in the last 168 hours. No results for input(s): "AMMONIA" in the last 168 hours.  ABG    Component Value Date/Time   HCO3 35.8 (H) 12/18/2018 0750   O2SAT 91.1 12/18/2018 0750     Coagulation Profile: No results for input(s): "INR", "PROTIME" in the last 168 hours.  Cardiac Enzymes: No results for input(s): "CKTOTAL", "CKMB", "CKMBINDEX", "TROPONINI" in the last 168 hours.  HbA1C: Hgb A1c MFr Bld  Date/Time Value Ref Range Status  04/18/2018 06:34 AM 5.9 (H) 4.8 - 5.6 % Final    Comment:    (NOTE) Pre diabetes:          5.7%-6.4% Diabetes:              >6.4% Glycemic control for   <7.0% adults with diabetes     CBG: Recent Labs  Lab 09/29/23 2100  GLUCAP 151*    Review of Systems:   Review of Systems  Constitutional:  Positive for malaise/fatigue. Negative for chills, diaphoresis, fever and weight loss.  HENT: Negative.    Eyes: Negative.   Respiratory:  Positive for cough,  sputum production and shortness of breath. Negative for hemoptysis.   Cardiovascular: Negative.   Gastrointestinal:  Positive for abdominal pain, blood in stool, constipation, diarrhea, nausea and vomiting.  Genitourinary: Negative.   Musculoskeletal:  Positive for back pain and neck pain.  Skin: Negative.   Neurological: Negative.   Endo/Heme/Allergies: Negative.   Psychiatric/Behavioral:  Positive for depression.    Past Medical History  She,  has a past medical history of Acute postoperative pain (01/16/2019), Allergy, Asthma, Cerebral aneurysm, Chest pain (11/01/2016), Chronic migraine, Chronic pain syndrome, Chronic respiratory failure (HCC), Constipation, COPD (chronic obstructive pulmonary disease) (HCC), Depression, Hypercholesterolemia, Hypertension, Osteoarthritis, Pneumonia, Sarcoid, Seizures (HCC), and Sleep apnea.   Surgical History    Past Surgical History:  Procedure Laterality Date   ABDOMINAL HYSTERECTOMY     BACK SURGERY     spine fusion anterior lumbar with open posterior   BIOPSY  07/02/2023   Procedure: BIOPSY;  Surgeon: Regis Bill, MD;  Location: [facility] ENDOSCOPY;  Service: Endoscopy;;   CHOLECYSTECTOMY     COLONOSCOPY WITH PROPOFOL N/A 08/19/2019   Procedure: COLONOSCOPY WITH PROPOFOL;  Surgeon: Christena Deem, MD;  Location[facility] ENDOSCOPY;  Service: Endoscopy;  Laterality: N/A;   ESOPHAGOGASTRODUODENOSCOPY (EGD) WITH PROPOFOL N/A 08/19/2019   Procedure: ESOPHAGOGASTRODUODENOSCOPY (EGD) WITH PROPOFOL;  Surgeon: Christena Deem, MD;  Location: [facility] ENDOSCOPY;  Service: Endoscopy;  Laterality: N/A;   ESOPHAGOGASTRODUODENOSCOPY (EGD) WITH PROPOFOL N/A 08/04/2020   Procedure: ESOPHAGOGASTRODUODENOSCOPY (EGD) WITH PROPOFOL;  Surgeon: Earline Mayotte, MD;  Location: [facility] ENDOSCOPY;  Service: Endoscopy;  Laterality: N/A;   ESOPHAGOGASTRODUODENOSCOPY (EGD) WITH PROPOFOL N/A 11/05/2020   Procedure: ESOPHAGOGASTRODUODENOSCOPY (EGD) WITH PROPOFOL;  Surgeon:  Earline Mayotte, MD;  Location: [facility] ENDOSCOPY;  Service: Endoscopy;  Laterality: N/A;   ESOPHAGOGASTRODUODENOSCOPY (EGD) WITH PROPOFOL N/A 07/02/2023   Procedure: ESOPHAGOGASTRODUODENOSCOPY (EGD) WITH PROPOFOL;  Surgeon: Regis Bill, MD;  Location: [facility] ENDOSCOPY;  Service: Endoscopy;  Laterality: N/A;   SPINAL FUSION       Social History   reports that she has never smoked. She has never used smokeless tobacco. She reports current alcohol use. She reports that she does not use drugs.   Family History   Her family history includes Breast cancer in her mother; Dementia in her mother; Throat cancer in her father.   Allergies Allergies  Allergen Reactions   Penicillins Rash, Hives and Itching   Contrast Media [Iodinated Contrast Media] Swelling   Gabapentin    Labetalol Other (See Comments)    Made hair fall out   Lactose Intolerance (Gi)     Bloating and diarrhea    Sulfabenzamide Nausea Only   Pregabalin Other (See Comments)    The patient complained of hemifacial numbness with the use of the medication.     Home Medications  Prior to Admission medications   Medication Sig Start Date End Date Taking? Authorizing Provider  albuterol (PROVENTIL) (2.5 MG/3ML) 0.083% nebulizer solution Take 2.5 mg by nebulization every 6 (six) hours as needed for wheezing or shortness of breath.    [provider]  Albuterol Sulfate 108 (90 Base) MCG/ACT AEPB Inhale 2 puffs into the lungs every 6 (six) hours as needed.    [provider]  amLODipine-olmesartan (AZOR) 5-40 MG tablet TAKE 1 TABLET BY MOUTH EVERY MORNING 06/20/23   Sherron Monday, MD  aspirin EC 81 MG tablet Take 1 tablet (81 mg total) by mouth daily. 04/19/18   Clapacs, Jackquline Denmark, MD  atorvastatin (LIPITOR) 80 MG tablet TAKE 1 TABLET BY MOUTH NIGHTLY 09/10/23   Sherron Monday, MD  calcium carbonate (OS-CAL) 1250 (500 Ca) MG chewable tablet Chew by mouth daily.    [provider]  citalopram (CELEXA)  40 MG tablet TAKE ONE TABLET BY MOUTH EVERY MORNING 09/18/23   Sherron Monday, MD  diclofenac Sodium (VOLTAREN) 1 % GEL Apply 4 g topically 4 (four) times daily as needed (Painful Joints).    [provider]  dicyclomine (BENTYL) 20 MG tablet Take 1 tablet (20 mg total) by mouth every 6 (six) hours as needed for up to 7 days for spasms. 06/26/23 07/03/23  Trinna Post, MD  diphenhydrAMINE (BENADRYL) 25 MG tablet Take 25 mg by mouth every 6 (six) hours as needed.    [provider]  donepezil (ARICEPT) 10 MG tablet TAKE ONE TABLET BY MOUTH EVERY MORNING 07/30/23   Sherron Monday, MD  Elastic Bandages & Supports (MEDICAL COMPRESSION STOCKINGS) MISC Please provide compression stockings 12/30/18   Minna Antis, MD  Fluticasone Furoate (ARNUITY ELLIPTA) 100 MCG/ACT AEPB INHALE 1 INHALATION INTO THE LUNGS ONCE DAILY. 09/18/23  Sherron Monday, MD  hydrochlorothiazide (HYDRODIURIL) 25 MG tablet TAKE 1 TABLET BY MOUTH EVERY MORNING 06/25/23   Sherron Monday, MD  indomethacin (INDOCIN) 50 MG capsule Take 50 mg by mouth 3 (three) times daily.    [provider]  lactulose (CHRONULAC) 10 GM/15ML solution Take 10 g by mouth 2 (two) times daily as needed for mild constipation.    [provider]  levETIRAcetam (KEPPRA) 750 MG tablet TAKE ONE TABLET BY MOUTH EVERY MORNING 06/25/23   Sherron Monday, MD  LINZESS 290 MCG CAPS capsule TAKE 1 CAPSULE BY MOUTH EVERY MORNING 07/09/23   Sherron Monday, MD  mirtazapine (REMERON) 15 MG tablet Take 1 tablet (15 mg total) by mouth at bedtime. 09/21/23   Sherron Monday, MD  nitroGLYCERIN (NITROSTAT) 0.4 MG SL tablet Place 0.4 mg under the tongue every 5 (five) minutes as needed for chest pain.    [provider]  Oxycodone HCl 10 MG TABS Take 10 mg by mouth 3 (three) times daily as needed.    [provider]  pantoprazole (PROTONIX) 40 MG tablet TAKE ONE TABLET BY MOUTH EVERY MORNING 06/12/23    Sherron Monday, MD  potassium chloride SA (KLOR-CON M) 20 MEQ tablet TAKE ONE TABLET BY MOUTH EVERY MORNING 09/18/23   Sherron Monday, MD  promethazine (PHENERGAN) 25 MG tablet Take 1 tablet (25 mg total) by mouth every 6 (six) hours as needed for nausea or vomiting. 04/30/23 05/30/23  Sherron Monday, MD  sucralfate (CARAFATE) 1 g tablet TAKE ONE TABLET BY MOUTH FOUR TIMES DAILY BEFORE MEALS AND AT BEDTIME 03/20/23   Orson Eva, NP  Suvorexant (BELSOMRA) 20 MG TABS TAKE ONE TABLET BY MOUTH AT BEDTIME 09/18/23   Sherron Monday, MD  tiZANidine (ZANAFLEX) 4 MG tablet Take 1 tablet (4 mg total) by mouth 3 (three) times daily. 03/29/20 05/09/23  Delano Metz, MD  Scheduled Meds:  insulin aspart  0-6 Units Subcutaneous Q4H   ipratropium-albuterol  3 mL Nebulization Q6H   Continuous Infusions:  cefTRIAXone (ROCEPHIN)  IV Stopped (09/29/23 1950)   lactated ringers     pantoprazole 8 mg/hr (09/29/23 2105)   PRN Meds:.docusate sodium, ipratropium-albuterol, morphine injection, polyethylene glycol  Active Hospital Problem list   See systems below  Assessment & Plan:  #Acute GI Bleed due to suspected bleeding Gastric Ulcer? #Acute Blood Loss Anemia #Hemorrhagic shock PMHx: Gastric ulcers S/p EGD 7/24: Non bleeding multiple gastric ulcers -Maintain 2 large bore IV's -NPO -Protonix 40 mg IV BID -Monitor for S/Sx of bleeding -Trend CBC (H&H q6h) -pressor for MAP Goal, currently not requiring pressors -SCD's for VTE Prophylaxis (chemical ppx contraindicated) -Transfuse for Hgb <7  (has received 2 units pRBC's) -Hold NSAIDs anticoagulations, steroids -GI consult   #AKI likely ATN #Lactic Acidosis -Monitor I&O's / urinary output -Follow BMP -Ensure adequate renal perfusion -Avoid nephrotoxic agents as able -Replace electrolytes as indicated -Trend Lactic  #Sepsis due to suspected intrabdominal source Initial interventions/workup included: 30cc/kg of NS & Cefepime/  Vancomycin/ Metronidazole Pt presenting w/abd pain n/v, diarrhea hx of Gastroenteritis -F/u cultures, trend lactic/ PCT -GI panel pending -Cdiff pending -Monitor WBC/ fever curve -Start Zosyn -IVF hydration as needed -Pressors for MAP goal >65 -Strict I/O's  #Chronic Hypoxic and Hypercapnic Respiratory Failure #COPD without Acute Exacerbation  Background asthma, sarcoid and COPD, on chronic oxygen at 3 L at home.   -Supplemental O2 as needed to maintain O2 saturations 88 to 92% -CPAP at night -As  needed and scheduled bronchodilators    #History of seizures #Cerebral aneurysm -Continue home keppra  #HTN #HLD -Continue atorvastatin -Hold aspirin in the setting of GI bleed -Hold amlodipine-olmesartan and hydrochlorothiazide in the setting of hypotension  #History of depression -Continue Remeron    Best practice:  Diet:  NPO Pain/Anxiety/Delirium protocol (if indicated): No VAP protocol (if indicated): Not indicated DVT prophylaxis: Contraindicated GI prophylaxis: PPI Glucose control:  SSI Yes Central venous access:  N/A Arterial line:  N/A Foley:  N/A Mobility:  bed rest  PT consulted: N/A Last date of multidisciplinary goals of care discussion [10/12] Code Status:  full code Disposition: Progressive Care Unit   = Goals of Care = Code Status Order: FULL  Primary Emergency Contact: COLEMAN,TAMMI, Home Phone: [phone] Wishes to pursue full aggressive treatment and intervention options, including CPR and intubation, but goals of care will be addressed on going with family if that should become necessary. .  Critical care time: 45 minutes        Webb Silversmith DNP, CCRN, FNP-C, AGACNP-BC Acute Care & Family Nurse Practitioner [facility] PCCM on call pager [phone]

## 2023-09-29 NOTE — ED Notes (Signed)
Pt to CT

## 2023-09-29 NOTE — ED Notes (Signed)
Called lab to send blood band bracelets.

## 2023-09-29 NOTE — ED Notes (Signed)
EDP at bedside

## 2023-09-29 NOTE — Progress Notes (Signed)
CODE SEPSIS - PHARMACY COMMUNICATION  **Broad Spectrum Antibiotics should be administered within 1 hour of Sepsis diagnosis**  Time Code Sepsis Called/Page Received: 1303  Antibiotics Ordered: levofloxacin & metronidazole   Time of 1st antibiotic administration: 1314  Additional action taken by pharmacy:   If necessary, Name of Provider/Nurse Contacted:   Littie Deeds, PharmD Pharmacy Resident  09/29/2023 1:26 PM

## 2023-09-29 NOTE — ED Triage Notes (Signed)
pt to ED [facility] from home, lives by self for SOB Wears 3L at baseline    Pt states SOB and weakness increasing since 1 month Hx COPD Has chronic nonproductive cough Pt also states sharp RLQ abdominal pain and intermittent diarrhea and constipation (varies) since 3-4 weeks  EMS VS: 98.9,86/40 (takes BP meds), CBG 306, no hx DM Denies urinary symptoms, no blood thinners only aspirin  No IV access Alert and oriented  EDP at bedside, took BP 3 times, 65/49, is alert, attempting IV access, poor vasculature

## 2023-09-29 NOTE — ED Notes (Signed)
EDP informed lactic 2.6.

## 2023-09-29 NOTE — H&P (Signed)
History and Physical    Patient: Jean Gomez:[id number] DOB: 09-Jul-1956 DOA: 09/29/2023 DOS: the patient was seen and examined on 09/29/2023 PCP: Sherron Monday, MD  Patient coming from: Home   Chief Complaint:  Chief Complaint  Patient presents with   Weakness    HPI: Jean Gomez is a 67 y.o. female with medical history significant for sarcoidosis omn oxygen - chronic respiratory failure on 3 L, OSA on CPAP, COPD, asthma , depression, hyperlipidemia, seizure, GIB,seizures, cerebral aneurysm, HDL, depression, osteoarthritis , Gastric Ulcer on egd in July 2024 coming with generalized weakness dizziness, falls , abdominal pain- generalized , and diarrhea that is  watery, and clear and sometimes soft and pasty. NO blood or dark color or odor to it. Pt is not having any hematemesis. No fever chills or chest pain palpitation.  In ed pt was in shock on arrival with systolic in the 60's which matches up with her h/o dizziness and weakness and falls with no head trauma. Pt has labs about noon showing anemia. And was MIVF for resuscitation along with one unit of blood. Initial triage vitals show systolic of 65 respirations of 20 and O2 sats of 99% on 3 L.  Code sepsis was called on arrival due to patient being in shock on presentation.  Initial lactic of 2.6, hemoglobin of 7.9 AKI of 1.91 with a EGFR of 28, normal electrolytes, LFTs added on, leukocytosis of 24.  And platelet count of 577 which I suspect is reactive secondary to her anemia.  As far as her blood work extends chart shows patient has been chronically anemic since 2019 with a hemoglobin of 10.8.  Review of Systems: Review of Systems  Constitutional:  Positive for malaise/fatigue.  Respiratory:  Positive for shortness of breath.   Gastrointestinal:  Positive for abdominal pain, diarrhea and nausea.    Past Medical History:  Diagnosis Date   Acute postoperative pain 01/16/2019   Allergy    Asthma    Cerebral aneurysm     Chest pain 11/01/2016   Chronic migraine    Migraine   Chronic pain syndrome    Chronic respiratory failure (HCC)    Constipation    COPD (chronic obstructive pulmonary disease) (HCC)    Depression    major depression in remission   Hypercholesterolemia    Hypertension    Osteoarthritis    Pneumonia    12/2018   Sarcoid    Seizures (HCC)    Sleep apnea    Past Surgical History:  Procedure Laterality Date   ABDOMINAL HYSTERECTOMY     BACK SURGERY     spine fusion anterior lumbar with open posterior   BIOPSY  07/02/2023   Procedure: BIOPSY;  Surgeon: Regis Bill, MD;  Location: [facility] ENDOSCOPY;  Service: Endoscopy;;   CHOLECYSTECTOMY     COLONOSCOPY WITH PROPOFOL N/A 08/19/2019   Procedure: COLONOSCOPY WITH PROPOFOL;  Surgeon: Christena Deem, MD;  Location: [facility] ENDOSCOPY;  Service: Endoscopy;  Laterality: N/A;   ESOPHAGOGASTRODUODENOSCOPY (EGD) WITH PROPOFOL N/A 08/19/2019   Procedure: ESOPHAGOGASTRODUODENOSCOPY (EGD) WITH PROPOFOL;  Surgeon: Christena Deem, MD;  Location: [facility] ENDOSCOPY;  Service: Endoscopy;  Laterality: N/A;   ESOPHAGOGASTRODUODENOSCOPY (EGD) WITH PROPOFOL N/A 08/04/2020   Procedure: ESOPHAGOGASTRODUODENOSCOPY (EGD) WITH PROPOFOL;  Surgeon: Earline Mayotte, MD;  Location: [facility] ENDOSCOPY;  Service: Endoscopy;  Laterality: N/A;   ESOPHAGOGASTRODUODENOSCOPY (EGD) WITH PROPOFOL N/A 11/05/2020   Procedure: ESOPHAGOGASTRODUODENOSCOPY (EGD) WITH PROPOFOL;  Surgeon: Earline Mayotte, MD;  Location: [facility]  ENDOSCOPY;  Service: Endoscopy;  Laterality: N/A;   ESOPHAGOGASTRODUODENOSCOPY (EGD) WITH PROPOFOL N/A 07/02/2023   Procedure: ESOPHAGOGASTRODUODENOSCOPY (EGD) WITH PROPOFOL;  Surgeon: Regis Bill, MD;  Location: [facility] ENDOSCOPY;  Service: Endoscopy;  Laterality: N/A;   SPINAL FUSION     Social History:   reports that she has never smoked. She has never used smokeless tobacco. She reports current alcohol use. She reports that she does not use  drugs.  Allergies  Allergen Reactions   Penicillins Rash, Hives and Itching   Contrast Media [Iodinated Contrast Media] Swelling   Gabapentin    Labetalol Other (See Comments)    Made hair fall out   Lactose Intolerance (Gi)     Bloating and diarrhea    Sulfabenzamide Nausea Only   Pregabalin Other (See Comments)    The patient complained of hemifacial numbness with the use of the medication.    Family History  Problem Relation Age of Onset   Dementia Mother    Breast cancer Mother    Throat cancer Father     Prior to Admission medications   Medication Sig Start Date End Date Taking? Authorizing Provider  albuterol (PROVENTIL) (2.5 MG/3ML) 0.083% nebulizer solution Take 2.5 mg by nebulization every 6 (six) hours as needed for wheezing or shortness of breath.    [provider]  Albuterol Sulfate 108 (90 Base) MCG/ACT AEPB Inhale 2 puffs into the lungs every 6 (six) hours as needed.    [provider]  amLODipine-olmesartan (AZOR) 5-40 MG tablet TAKE 1 TABLET BY MOUTH EVERY MORNING 06/20/23   Sherron Monday, MD  aspirin EC 81 MG tablet Take 1 tablet (81 mg total) by mouth daily. 04/19/18   Clapacs, Jackquline Denmark, MD  atorvastatin (LIPITOR) 80 MG tablet TAKE 1 TABLET BY MOUTH NIGHTLY 09/10/23   Sherron Monday, MD  calcium carbonate (OS-CAL) 1250 (500 Ca) MG chewable tablet Chew by mouth daily.    [provider]  citalopram (CELEXA) 40 MG tablet TAKE ONE TABLET BY MOUTH EVERY MORNING 09/18/23   Sherron Monday, MD  diclofenac Sodium (VOLTAREN) 1 % GEL Apply 4 g topically 4 (four) times daily as needed (Painful Joints).    [provider]  dicyclomine (BENTYL) 20 MG tablet Take 1 tablet (20 mg total) by mouth every 6 (six) hours as needed for up to 7 days for spasms. 06/26/23 07/03/23  Trinna Post, MD  diphenhydrAMINE (BENADRYL) 25 MG tablet Take 25 mg by mouth every 6 (six) hours as needed.    [provider]  donepezil (ARICEPT) 10 MG tablet  TAKE ONE TABLET BY MOUTH EVERY MORNING 07/30/23   Sherron Monday, MD  Elastic Bandages & Supports (MEDICAL COMPRESSION STOCKINGS) MISC Please provide compression stockings 12/30/18   Minna Antis, MD  Fluticasone Furoate (ARNUITY ELLIPTA) 100 MCG/ACT AEPB INHALE 1 INHALATION INTO THE LUNGS ONCE DAILY. 09/18/23   Sherron Monday, MD  hydrochlorothiazide (HYDRODIURIL) 25 MG tablet TAKE 1 TABLET BY MOUTH EVERY MORNING 06/25/23   Sherron Monday, MD  indomethacin (INDOCIN) 50 MG capsule Take 50 mg by mouth 3 (three) times daily.    [provider]  lactulose (CHRONULAC) 10 GM/15ML solution Take 10 g by mouth 2 (two) times daily as needed for mild constipation.    [provider]  levETIRAcetam (KEPPRA) 750 MG tablet TAKE ONE TABLET BY MOUTH EVERY MORNING 06/25/23   Sherron Monday, MD  LINZESS 290 MCG CAPS capsule TAKE 1 CAPSULE BY MOUTH EVERY  MORNING 07/09/23   Sherron Monday, MD  mirtazapine (REMERON) 15 MG tablet Take 1 tablet (15 mg total) by mouth at bedtime. 09/21/23   Sherron Monday, MD  nitroGLYCERIN (NITROSTAT) 0.4 MG SL tablet Place 0.4 mg under the tongue every 5 (five) minutes as needed for chest pain.    [provider]  Oxycodone HCl 10 MG TABS Take 10 mg by mouth 3 (three) times daily as needed.    [provider]  pantoprazole (PROTONIX) 40 MG tablet TAKE ONE TABLET BY MOUTH EVERY MORNING 06/12/23   Sherron Monday, MD  potassium chloride SA (KLOR-CON M) 20 MEQ tablet TAKE ONE TABLET BY MOUTH EVERY MORNING 09/18/23   Sherron Monday, MD  promethazine (PHENERGAN) 25 MG tablet Take 1 tablet (25 mg total) by mouth every 6 (six) hours as needed for nausea or vomiting. 04/30/23 05/30/23  Sherron Monday, MD  sucralfate (CARAFATE) 1 g tablet TAKE ONE TABLET BY MOUTH FOUR TIMES DAILY BEFORE MEALS AND AT BEDTIME 03/20/23   Orson Eva, NP  Suvorexant (BELSOMRA) 20 MG TABS TAKE ONE TABLET BY MOUTH AT BEDTIME 09/18/23   Sherron Monday, MD  tiZANidine (ZANAFLEX) 4 MG tablet Take 1 tablet (4 mg total) by mouth 3 (three) times daily. 03/29/20 05/09/23  Delano Metz, MD     Vitals:   09/29/23 2230 09/29/23 2315 09/29/23 2318 09/29/23 2330  BP: 121/69 113/70 113/70 116/73  Pulse: (!) 108 (!) 109 (!) 109 (!) 110  Resp: 20 17 17 20   Temp:   98.4 F (36.9 C)   TempSrc:   Oral   SpO2: 98% 97%  99%  Weight:      Height:       Physical Exam Vitals and nursing note reviewed.  Constitutional:      General: She is not in acute distress.    Interventions: Nasal cannula in place.  HENT:     Head: Normocephalic and atraumatic.     Right Ear: Hearing normal.     Left Ear: Hearing normal.     Nose: Nose normal. No nasal deformity.     Mouth/Throat:     Lips: Pink.     Tongue: No lesions.     Pharynx: Oropharynx is clear.  Eyes:     General: Lids are normal.     Extraocular Movements: Extraocular movements intact.  Cardiovascular:     Rate and Rhythm: Normal rate and regular rhythm.     Heart sounds: Normal heart sounds.  Pulmonary:     Effort: Pulmonary effort is normal.     Breath sounds: Normal breath sounds.     Comments: Diffuse bl crackles which is baseline from her sarcoid.   Abdominal:     General: Abdomen is protuberant. Bowel sounds are normal. There is no distension.     Palpations: Abdomen is soft. There is no mass.     Tenderness: There is generalized abdominal tenderness.    Musculoskeletal:     Right lower leg: No edema.     Left lower leg: No edema.  Skin:    General: Skin is warm.  Neurological:     General: No focal deficit present.     Mental Status: She is alert and oriented to person, place, and time.     Cranial Nerves: Cranial nerves 2-12 are intact.  Psychiatric:        Attention and Perception: Attention normal.        Mood and Affect: Mood  normal.        Speech: Speech normal.        Behavior: Behavior normal. Behavior is cooperative.      Labs on Admission: I have  personally reviewed following labs and imaging studies  CBC: Recent Labs  Lab 09/29/23 1237  WBC 24.9*  HGB 7.9*  HCT 25.6*  MCV 76.4*  PLT 577*   Basic Metabolic Panel: Recent Labs  Lab 09/29/23 1237  NA 137  K 4.3  CL 104  CO2 22  GLUCOSE 175*  BUN 29*  CREATININE 1.91*  CALCIUM 8.5*   CBG: Recent Labs  Lab 09/29/23 2100 09/29/23 2335  GLUCAP 151* 94   Urinalysis    Component Value Date/Time   COLORURINE YELLOW (A) 09/29/2023 1640   APPEARANCEUR HAZY (A) 09/29/2023 1640   LABSPEC 1.009 09/29/2023 1640   PHURINE 5.0 09/29/2023 1640   GLUCOSEU NEGATIVE 09/29/2023 1640   HGBUR SMALL (A) 09/29/2023 1640   BILIRUBINUR NEGATIVE 09/29/2023 1640   KETONESUR NEGATIVE 09/29/2023 1640   PROTEINUR NEGATIVE 09/29/2023 1640   NITRITE NEGATIVE 09/29/2023 1640   LEUKOCYTESUR NEGATIVE 09/29/2023 1640    Results for orders placed or performed during the hospital encounter of 09/29/23 (from the past 24 hour(s))  Basic metabolic panel     Status: Abnormal   Collection Time: 09/29/23 12:37 PM  Result Value Ref Range   Sodium 137 135 - 145 mmol/L   Potassium 4.3 3.5 - 5.1 mmol/L   Chloride 104 98 - 111 mmol/L   CO2 22 22 - 32 mmol/L   Glucose, Bld 175 (H) 70 - 99 mg/dL   BUN 29 (H) 8 - 23 mg/dL   Creatinine, Ser 1.61 (H) 0.44 - 1.00 mg/dL   Calcium 8.5 (L) 8.9 - 10.3 mg/dL   GFR, Estimated 28 (L) >60 mL/min   Anion gap 11 5 - 15  CBC     Status: Abnormal   Collection Time: 09/29/23 12:37 PM  Result Value Ref Range   WBC 24.9 (H) 4.0 - 10.5 K/uL   RBC 3.35 (L) 3.87 - 5.11 MIL/uL   Hemoglobin 7.9 (L) 12.0 - 15.0 g/dL   HCT 09.6 (L) 04.5 - 40.9 %   MCV 76.4 (L) 80.0 - 100.0 fL   MCH 23.6 (L) 26.0 - 34.0 pg   MCHC 30.9 30.0 - 36.0 g/dL   RDW 81.1 (H) 91.4 - 78.2 %   Platelets 577 (H) 150 - 400 K/uL   nRBC 0.0 0.0 - 0.2 %  Type and screen [facility]     Status: None (Preliminary result)   Collection Time: 09/29/23 12:37 PM  Result Value Ref  Range   ABO/RH(D) A POS    Antibody Screen NEG    Sample Expiration 10/02/2023,2359    Unit Number [id number]    Blood Component Type RED CELLS,LR    Unit division 00    Status of Unit ISSUED    Transfusion Status OK TO TRANSFUSE    Crossmatch Result Compatible    Unit Number [id number]    Blood Component Type RED CELLS,LR    Unit division 00    Status of Unit ISSUED    Transfusion Status OK TO TRANSFUSE    Crossmatch Result      Compatible Performed at [facility], [address] [place], [place] [address]   Lactic acid, plasma     Status: Abnormal   Collection Time: 09/29/23 12:37 PM  Result Value Ref Range   Lactic  Acid, Venous 2.6 (HH) 0.5 - 1.9 mmol/L  Procalcitonin     Status: None   Collection Time: 09/29/23 12:37 PM  Result Value Ref Range   Procalcitonin 0.49 ng/mL  Lactic acid, plasma     Status: None   Collection Time: 09/29/23  1:08 PM  Result Value Ref Range   Lactic Acid, Venous 1.5 0.5 - 1.9 mmol/L  Resp panel by RT-PCR (RSV, Flu A&B, Covid) Anterior Nasal Swab     Status: None   Collection Time: 09/29/23  1:53 PM   Specimen: Anterior Nasal Swab  Result Value Ref Range   SARS Coronavirus 2 by RT PCR NEGATIVE NEGATIVE   Influenza A by PCR NEGATIVE NEGATIVE   Influenza B by PCR NEGATIVE NEGATIVE   Resp Syncytial Virus by PCR NEGATIVE NEGATIVE  Prepare RBC (crossmatch)     Status: None   Collection Time: 09/29/23  2:52 PM  Result Value Ref Range   Order Confirmation      ORDER PROCESSED BY BLOOD BANK Performed at [facility], [address] [place], [place] [address]   Urinalysis, w/ Reflex to Culture (Infection Suspected) -Urine, Clean Catch     Status: Abnormal   Collection Time: 09/29/23  4:40 PM  Result Value Ref Range   Specimen Source URINE, CLEAN CATCH    Color, Urine YELLOW (A) YELLOW   APPearance HAZY (A) CLEAR   Specific Gravity, Urine 1.009 1.005 - 1.030   pH 5.0 5.0 - 8.0   Glucose, UA NEGATIVE NEGATIVE mg/dL    Hgb urine dipstick SMALL (A) NEGATIVE   Bilirubin Urine NEGATIVE NEGATIVE   Ketones, ur NEGATIVE NEGATIVE mg/dL   Protein, ur NEGATIVE NEGATIVE mg/dL   Nitrite NEGATIVE NEGATIVE   Leukocytes,Ua NEGATIVE NEGATIVE   RBC / HPF 0 0 - 5 RBC/hpf   WBC, UA 0-5 0 - 5 WBC/hpf   Bacteria, UA RARE (A) NONE SEEN   Squamous Epithelial / HPF 0-5 0 - 5 /HPF   Mucus PRESENT    Hyaline Casts, UA PRESENT   Prepare RBC (crossmatch)     Status: None   Collection Time: 09/29/23  7:38 PM  Result Value Ref Range   Order Confirmation      ORDER PROCESSED BY BLOOD BANK Performed at [facility], [address] [place], [place] [address]   CBG monitoring, ED     Status: Abnormal   Collection Time: 09/29/23  9:00 PM  Result Value Ref Range   Glucose-Capillary 151 (H) 70 - 99 mg/dL  CBG monitoring, ED     Status: None   Collection Time: 09/29/23 11:35 PM  Result Value Ref Range   Glucose-Capillary 94 70 - 99 mg/dL    Unresulted Labs (From admission, onward)     Start     Ordered   09/30/23 0500  Phosphorus  Daily,   STAT (with R occurrences)      09/29/23 2026   09/30/23 0500  Magnesium  Daily,   STAT (with R occurrences)      09/29/23 2026   09/30/23 0500  CBC  Daily,   STAT (with R occurrences)      09/29/23 2026   09/30/23 0500  Comprehensive metabolic panel  (Septic presentation on arrival (screening labs, nursing and treatment orders for obvious sepsis))  Tomorrow morning,   R        09/29/23 2238   09/29/23 2327  Brain natriuretic peptide  Once,   R  09/29/23 2326   09/29/23 2316  Stool culture  Once,   R        09/29/23 2315   09/29/23 2238  Hepatic function panel  Add-on,   AD        09/29/23 2237   09/29/23 2026  Hemoglobin A1c  (Glycemic Control (SSI)  Q 4 Hours / Glycemic Control (SSI)  AC +/- HS)  Add-on,   AD       Comments: To assess prior glycemic control    09/29/23 2026   09/29/23 1505  C Difficile Quick Screen w PCR reflex  (C Difficile quick screen w PCR  reflex panel )  Once, for 24 hours,   URGENT       References:    CDiff Information Tool   09/29/23 1504   09/29/23 1505  Gastrointestinal Panel by PCR , Stool  (Gastrointestinal Panel by PCR, Stool                                                                                                                                                     **Does Not include CLOSTRIDIUM DIFFICILE testing. **If CDIFF testing is needed, place order from the "C Difficile Testing" order set.**)  Once,   URGENT        09/29/23 1504   09/29/23 1303  Blood Culture (routine x 2)  (Septic presentation on arrival (screening labs, nursing and treatment orders for obvious sepsis))  BLOOD CULTURE X 2,   STAT      09/29/23 1303            Medications  cefTRIAXone (ROCEPHIN) 2 g in sodium chloride 0.9 % 100 mL IVPB (0 g Intravenous Stopped 09/29/23 1950)  docusate sodium (COLACE) capsule 100 mg (has no administration in time range)  polyethylene glycol (MIRALAX / GLYCOLAX) packet 17 g (has no administration in time range)  lactated ringers infusion (has no administration in time range)  ipratropium-albuterol (DUONEB) 0.5-2.5 (3) MG/3ML nebulizer solution 3 mL (3 mLs Nebulization Given 09/29/23 2107)  ipratropium-albuterol (DUONEB) 0.5-2.5 (3) MG/3ML nebulizer solution 3 mL (has no administration in time range)  insulin aspart (novoLOG) injection 0-6 Units ( Subcutaneous Not Given 09/29/23 2337)  pantoprozole (PROTONIX) 80 mg /NS 100 mL infusion (8 mg/hr Intravenous New Bag/Given 09/29/23 2105)  morphine (PF) 2 MG/ML injection 2 mg (has no administration in time range)  lactobacillus acidophilus (BACID) tablet 2 tablet (has no administration in time range)  levETIRAcetam (KEPPRA) tablet 750 mg (has no administration in time range)  donepezil (ARICEPT) tablet 10 mg (has no administration in time range)  citalopram (CELEXA) tablet 40 mg (has no administration in time range)  sodium chloride 0.9 % bolus 1,000 mL (0 mLs  Intravenous Stopped 09/29/23 1459)  levofloxacin (LEVAQUIN) IVPB 750 mg (0 mg Intravenous Stopped 09/29/23 1445)  metroNIDAZOLE (FLAGYL) IVPB  500 mg (0 mg Intravenous Stopped 09/29/23 1615)  sodium chloride 0.9 % bolus 1,000 mL (0 mLs Intravenous Stopped 09/29/23 1950)  pantoprazole (PROTONIX) injection 40 mg (40 mg Intravenous Given 09/29/23 1509)  sodium chloride 0.9 % bolus 1,000 mL (0 mLs Intravenous Stopped 09/29/23 1950)  0.9 %  sodium chloride infusion (0 mL/hr Intravenous Stopped 09/29/23 1743)  acetaminophen (TYLENOL) tablet 1,000 mg (1,000 mg Oral Given 09/29/23 1744)  0.9 %  sodium chloride infusion (0 mL/hr Intravenous Stopped 09/29/23 2337)    Radiological Exams on Admission: CT ABDOMEN PELVIS WO CONTRAST  Result Date: 09/29/2023 CLINICAL DATA:  Sepsis. EXAM: CT ABDOMEN AND PELVIS WITHOUT CONTRAST TECHNIQUE: Multidetector CT imaging of the abdomen and pelvis was performed following the standard protocol without IV contrast. RADIATION DOSE REDUCTION: This exam was performed according to the departmental dose-optimization program which includes automated exposure control, adjustment of the mA and/or kV according to patient size and/or use of iterative reconstruction technique. COMPARISON:  08/30/2023 FINDINGS: Lower chest: Emphysematous changes noted within the imaged portions of the lung bases. No pleural effusion or airspace consolidation. Hepatobiliary: No focal liver abnormality is seen. Status post cholecystectomy. No biliary dilatation. Pancreas: Unremarkable. No pancreatic ductal dilatation or surrounding inflammatory changes. Spleen: Normal in size without focal abnormality. Adrenals/Urinary Tract: Adrenal glands are unremarkable. Kidneys are normal, without renal calculi, focal lesion, or hydronephrosis. Bladder is unremarkable. Stomach/Bowel: Stomach is normal. The appendix is visualized and appears normal. No pathologic dilatation of the large or small bowel loops. Liquid and  solid stool identified within the proximal and mid colon. No bowel wall thickening, inflammation or distension. Vascular/Lymphatic: Aortic atherosclerosis. No aneurysm. Prominent central mesenteric lymph nodes are identified measuring up to 1 cm. For example index lymph node measures 1 cm, image 37/3. Previously 0.9 cm. Lymph node ventral to the aortic bifurcation measures 1 cm, image 46/3. Formally 1.1 cm. No pelvic or inguinal adenopathy. Reproductive: Status post hysterectomy. No adnexal masses. Other: There is no ascites. No signs of pneumoperitoneum. No discrete fluid collections. Musculoskeletal: No acute or significant osseous findings. No acute or suspicious osseous findings. Mild lumbar degenerative disc disease with first degree anterolisthesis of L5 on S1. Stable. IMPRESSION: 1. No acute findings within the abdomen or pelvis. 2. Liquid and solid stool identified within the proximal and mid colon. This is a nonspecific finding but can be seen in the setting of diarrheal illness. 3. Prominent central mesenteric lymph nodes are identified measuring up to 1 cm. These are nonspecific and may be reactive. These appear unchanged compared with 08/30/2023. 4. Aortic Atherosclerosis (ICD10-I70.0) and Emphysema (ICD10-J43.9). Electronically Signed   By: Signa Kell M.D.   On: 09/29/2023 14:44   DG Chest Port 1 View  Result Date: 09/29/2023 CLINICAL DATA:  67 year old female with possible sepsis. EXAM: PORTABLE CHEST 1 VIEW COMPARISON:  Chest x-ray 08/30/2023. FINDINGS: Diffuse interstitial prominence, peribronchial cuffing and areas of bronchiectasis and architectural distortion noted throughout the lungs bilaterally, with some upward retraction of hilar structures. No definite acute consolidative airspace disease. No pleural effusions. No pneumothorax. No evidence of pulmonary edema. Heart size is normal. Upper mediastinal contours are within normal limits. IMPRESSION: 1. No definite acute findings. Chronic  lung changes, as above, similar to the prior study. These are nonspecific and could reflect areas of chronic post infectious or inflammatory scarring, or could be related to a systemic disease such as sarcoidosis. Electronically Signed   By: Trudie Reed M.D.   On: 09/29/2023 14:06    Data Reviewed:  Relevant notes from primary care and specialist visits, past discharge summaries as available in EHR, including Care Everywhere. Prior diagnostic testing as pertinent to current admission diagnoses Updated medications and problem lists for reconciliation ED course, including vitals, labs, imaging, treatment and response to treatment Triage notes, nursing and pharmacy notes and ED provider's notes Notable results as noted in HPI  Assessment and Plan: >>General weakness: 2/2 to anemia/ and shock.  Supportive care with fall precaution and treating the underlying issue which in this case is her GIB assoc shock and electrolyte correction.   >>Dizziness: 2/2 Hypovolemic /hemorrhagic shock. With guaiac positive stools.   >>Abdominal Pain: 2/2 to gastric ulcer and ct concern for diarrheal illness.   >>Shock: 2/2 hemorrhagic / hypovolemic: Cont transfusion.  Pt needs repeat UGI eval  for her gastric ulcer and is at high risk of perforation and decline.  Will need close f/u with GI and repeat cbc monthly or so with pcp until her ulcers are healed.    >>ABLA/ Gastric Ulcer:    Latest Ref Rng & Units 09/29/2023   12:37 PM 09/06/2023    5:22 AM 09/05/2023    3:24 AM  CBC  WBC 4.0 - 10.5 K/uL 24.9  14.0  17.2   Hemoglobin 12.0 - 15.0 g/dL 7.9  9.7  9.2   Hematocrit 36.0 - 46.0 % 25.6  30.9  29.0   Platelets 150 - 400 K/uL 577  617  580   Pt receiving 2nd unit of Blood. STOP NSAIDS, Protonix IV q12h.  Will follow H/H. GI consult: Dr. Tobi Bastos   >>Diarrhea: Do not suspect infectious - although will obtain stool pcr and culture.  Pt is also on linzess/ Lactulose  which could be contributing  to her diarrhea.  Suspect from chronic blood loss.  Will start probiotics.   >>SOB/ C/H respiratory failure 2/2 to sarcoid: At baseline o2 requirement.  CT abd done today negative for any pleural effusion.  We will obtain baseline 2 d echo as well.  Troponin and BNP ordered.   >> Htn: Vitals:   09/29/23 2030 09/29/23 2045 09/29/23 2100 09/29/23 2115  BP: 133/75 138/87 125/80 114/74   09/29/23 2130 09/29/23 2145 09/29/23 2200 09/29/23 2215  BP: 117/75 123/79 118/82 108/68   09/29/23 2230 09/29/23 2315 09/29/23 2318 09/29/23 2330  BP: 121/69 113/70 113/70 116/73  Hold home  amlodipine, hydrochlorothiazide.   >>Seizure disorder: Continue keppra 750 mg po q 12h. Seizure precaution.    DVT prophylaxis:  Contraindicated.  Consults:  Gi: Dr. Tobi Bastos  Advance Care Planning:    Code Status: Full Code   Family Communication:  Daughter at bedside.   Disposition Plan:  Home.   Severity of Illness: The appropriate patient status for this patient is INPATIENT. Inpatient status is judged to be reasonable and necessary in order to provide the required intensity of service to ensure the patient's safety. The patient's presenting symptoms, physical exam findings, and initial radiographic and laboratory data in the context of their chronic comorbidities is felt to place them at high risk for further clinical deterioration. Furthermore, it is not anticipated that the patient will be medically stable for discharge from the hospital within 2 midnights of admission.   * I certify that at the point of admission it is my clinical judgment that the patient will require inpatient hospital care spanning beyond 2 midnights from the point of admission due to high intensity of service, high risk for further deterioration and high frequency of surveillance required.*  Author: Gertha Calkin, MD 09/29/2023 11:39 PM  For on call review [email or web address].

## 2023-09-30 ENCOUNTER — Inpatient Hospital Stay (HOSPITAL_COMMUNITY)
Admit: 2023-09-30 | Discharge: 2023-09-30 | Disposition: A | Discharge status: Home / Self Care | Payer: 59 | Attending: Internal Medicine | Admitting: Internal Medicine

## 2023-09-30 DIAGNOSIS — D649 Anemia, unspecified: Secondary | ICD-10-CM | POA: Diagnosis not present

## 2023-09-30 DIAGNOSIS — R0602 Shortness of breath: Secondary | ICD-10-CM

## 2023-09-30 DIAGNOSIS — K5900 Constipation, unspecified: Secondary | ICD-10-CM

## 2023-09-30 DIAGNOSIS — Z8719 Personal history of other diseases of the digestive system: Secondary | ICD-10-CM

## 2023-09-30 DIAGNOSIS — I509 Heart failure, unspecified: Secondary | ICD-10-CM

## 2023-09-30 DIAGNOSIS — K922 Gastrointestinal hemorrhage, unspecified: Secondary | ICD-10-CM | POA: Diagnosis not present

## 2023-09-30 DIAGNOSIS — R531 Weakness: Secondary | ICD-10-CM

## 2023-09-30 DIAGNOSIS — R197 Diarrhea, unspecified: Secondary | ICD-10-CM | POA: Diagnosis not present

## 2023-09-30 LAB — GASTROINTESTINAL PANEL BY PCR, STOOL (REPLACES STOOL CULTURE)

## 2023-09-30 LAB — HEPATIC FUNCTION PANEL
ALT: 15 U/L (ref 0–44)
AST: 19 U/L (ref 15–41)
Albumin: 2.2 g/dL — ABNORMAL LOW (ref 3.5–5.0)
Alkaline Phosphatase: 124 U/L (ref 38–126)
Bilirubin, Direct: <0.1 mg/dL (ref 0.0–0.2)
Total Bilirubin: 0.4 mg/dL (ref 0.3–1.2)
Total Protein: 6.5 g/dL (ref 6.5–8.1)

## 2023-09-30 LAB — BPAM RBC
Blood Product Expiration Date: 202411072359
Blood Product Expiration Date: 202411102359
ISSUE DATE / TIME: 202410121543
ISSUE DATE / TIME: 202410121953
Unit Type and Rh: 6200
Unit Type and Rh: 6200

## 2023-09-30 LAB — COMPREHENSIVE METABOLIC PANEL
ALT: 16 U/L (ref 0–44)
AST: 20 U/L (ref 15–41)
Albumin: 2.3 g/dL — ABNORMAL LOW (ref 3.5–5.0)
Alkaline Phosphatase: 116 U/L (ref 38–126)
Anion gap: 12 (ref 5–15)
BUN: 20 mg/dL (ref 8–23)
CO2: 21 mmol/L — ABNORMAL LOW (ref 22–32)
Calcium: 7.9 mg/dL — ABNORMAL LOW (ref 8.9–10.3)
Chloride: 109 mmol/L (ref 98–111)
Creatinine, Ser: 1.22 mg/dL — ABNORMAL HIGH (ref 0.44–1.00)
GFR, Estimated: 49 mL/min — ABNORMAL LOW (ref >60)
Glucose, Bld: 91 mg/dL (ref 70–99)
Potassium: 3.9 mmol/L (ref 3.5–5.1)
Sodium: 142 mmol/L (ref 135–145)
Total Bilirubin: 0.4 mg/dL (ref 0.3–1.2)
Total Protein: 6.5 g/dL (ref 6.5–8.1)

## 2023-09-30 LAB — GLUCOSE, CAPILLARY: Glucose-Capillary: 93 mg/dL (ref 70–99)

## 2023-09-30 LAB — CBG MONITORING, ED
Glucose-Capillary: 91 mg/dL (ref 70–99)
Glucose-Capillary: 147 mg/dL — ABNORMAL HIGH (ref 70–99)

## 2023-09-30 LAB — C DIFFICILE QUICK SCREEN W PCR REFLEX
C Diff antigen: POSITIVE — AB
C Diff toxin: NEGATIVE

## 2023-09-30 LAB — TROPONIN I (HIGH SENSITIVITY)
Troponin I (High Sensitivity): 42 ng/L — ABNORMAL HIGH (ref <18)
Troponin I (High Sensitivity): 37 ng/L — ABNORMAL HIGH (ref <18)

## 2023-09-30 LAB — TYPE AND SCREEN
ABO/RH(D): A POS
Antibody Screen: NEGATIVE
Unit division: 0
Unit division: 0

## 2023-09-30 LAB — ECHOCARDIOGRAM COMPLETE BUBBLE STUDY
AR max vel: 2.12 cm2
AV Peak grad: 15.4 mm[Hg]
Ao pk vel: 1.96 m/s
S' Lateral: 2.1 cm

## 2023-09-30 LAB — HEMOGLOBIN A1C
Hgb A1c MFr Bld: 6.3 % — ABNORMAL HIGH (ref 4.8–5.6)
Mean Plasma Glucose: 134.11 mg/dL

## 2023-09-30 LAB — CBC
HCT: 32.1 % — ABNORMAL LOW (ref 36.0–46.0)
Hemoglobin: 9.8 g/dL — ABNORMAL LOW (ref 12.0–15.0)
MCH: 24.7 pg — ABNORMAL LOW (ref 26.0–34.0)
MCHC: 30.5 g/dL (ref 30.0–36.0)
MCV: 80.9 fL (ref 80.0–100.0)
Platelets: 520 10*3/uL — ABNORMAL HIGH (ref 150–400)
RBC: 3.97 MIL/uL (ref 3.87–5.11)
RDW: 19.8 % — ABNORMAL HIGH (ref 11.5–15.5)
WBC: 29 10*3/uL — ABNORMAL HIGH (ref 4.0–10.5)
nRBC: 0 % (ref 0.0–0.2)

## 2023-09-30 LAB — MAGNESIUM: Magnesium: 1.8 mg/dL (ref 1.7–2.4)

## 2023-09-30 LAB — PHOSPHORUS: Phosphorus: 4 mg/dL (ref 2.5–4.6)

## 2023-09-30 LAB — BRAIN NATRIURETIC PEPTIDE: B Natriuretic Peptide: 95.7 pg/mL (ref 0.0–100.0)

## 2023-09-30 LAB — CLOSTRIDIUM DIFFICILE BY PCR, REFLEXED: Toxigenic C. Difficile by PCR: NEGATIVE

## 2023-09-30 MED ORDER — PROCHLORPERAZINE EDISYLATE 10 MG/2ML IJ SOLN
5.0000 mg | Freq: Four times a day (QID) | INTRAMUSCULAR | Status: DC | PRN
  Administered 2023-09-30: 5 mg via INTRAVENOUS
  Filled 2023-09-30 (×2): qty 1

## 2023-09-30 MED ORDER — ONDANSETRON HCL 4 MG/2ML IJ SOLN
4.0000 mg | Freq: Four times a day (QID) | INTRAMUSCULAR | Status: DC | PRN
  Administered 2023-09-30 – 2023-10-01 (×3): 4 mg via INTRAVENOUS
  Filled 2023-09-30 (×3): qty 2

## 2023-09-30 MED ORDER — SODIUM CHLORIDE 0.9 % IV SOLN
750.0000 mg | Freq: Once | INTRAVENOUS | Status: AC
  Administered 2023-09-30: 750 mg via INTRAVENOUS
  Filled 2023-09-30: qty 7.5

## 2023-09-30 MED ORDER — LEVETIRACETAM 750 MG PO TABS
750.0000 mg | ORAL_TABLET | Freq: Two times a day (BID) | ORAL | Status: DC
  Administered 2023-09-30 – 2023-10-03 (×6): 750 mg via ORAL
  Filled 2023-09-30 (×7): qty 1

## 2023-09-30 MED ORDER — BUTALBITAL-APAP-CAFFEINE 50-325-40 MG PO TABS
2.0000 | ORAL_TABLET | Freq: Two times a day (BID) | ORAL | Status: DC | PRN
  Administered 2023-09-30: 2 via ORAL
  Filled 2023-09-30: qty 2

## 2023-09-30 MED ORDER — PERFLUTREN LIPID MICROSPHERE
1.0000 mL | INTRAVENOUS | Status: AC | PRN
  Administered 2023-09-30: 3 mL via INTRAVENOUS

## 2023-09-30 NOTE — ED Notes (Signed)
Advised nurse that patient has ready bed 

## 2023-09-30 NOTE — Progress Notes (Signed)
PROGRESS NOTE    Jean Gomez  QMV:[id number] DOB: 16-Dec-1956 DOA: 09/29/2023 PCP: Sherron Monday, MD  124A/124A-AA  LOS: 1 day   Brief hospital course:   Assessment & Plan: Jean Gomez is a 67 y.o. female with medical history significant for sarcoidosis omn oxygen - chronic respiratory failure on 3 L, OSA on CPAP, COPD, asthma , depression, hyperlipidemia, seizure, GIB,seizures, cerebral aneurysm, HDL, depression, osteoarthritis , Gastric Ulcer on egd in July 2024 coming with generalized weakness dizziness, falls , abdominal pain- generalized , and diarrhea.  In the ED, pt was hypotensive in 60's, which improved with IVF and 2u pRBC.  Hypotension due to hypovolemia Ruled out shock --BP improved with IVF --cont MIVF --Hold home amlodipine, hydrochlorothiazide.  Acute on chronic anemia --received 2u pRBC on presentation for Hgb 7.9 due to initial concern for shock. --Monitor Hgb and transfuse to keep Hgb >7  Possible GI bleed Hx of gastric ulcer --cont IV PPI --EGD tomorrow --stop NSAIDs  N/V/D and abdominal pain --symptoms going on for 3-4 weeks.  CT a/p showed Liquid and solid stool but no acute finding. --C diff and GI path  AKI likely ATN --Cr 1.91 on presentation, with baseline around 0.6.  Due to dehydration and hypotension. --cont MIVF  >>General weakness: --due to above --PT   COPD Sarcoidosis Chronic hypoxemic respiratory failure on 3L O2 --reported more dyspnea, however, no increased O2 requirement, lungs sounded clear. --cont home 3L O2    >>Seizure disorder: Continue keppra 750 mg po q 12h. Seizure precaution.    DVT prophylaxis: SCD/Compression stockings Code Status: Full code  Family Communication:  Level of care: Med-Surg Dispo:   The patient is from: home Anticipated d/c is to: home Anticipated d/c date is: 1-2 days   Subjective and Interval History:  Pt reported N/V and diarrhea, and lower abdominal pain.  More dyspnea.   No dysuria.   Objective: Vitals:   09/30/23 0851 09/30/23 1106 09/30/23 1212 09/30/23 1251  BP: 136/84 120/69 132/79 (!) 142/76  Pulse: (!) 113 (!) 117 (!) 116 (!) 109  Resp: 20 20 18 20   Temp: 98.5 F (36.9 C) 98.8 F (37.1 C) 98.7 F (37.1 C) 97.8 F (36.6 C)  TempSrc: Oral Oral  Oral  SpO2: 96% 97% 98% 98%  Weight:      Height:        Intake/Output Summary (Last 24 hours) at 09/30/2023 1503 Last data filed at 09/30/2023 0716 Gross per 24 hour  Intake 900 ml  Output 750 ml  Net 150 ml   Filed Weights   09/29/23 1225  Weight: 79.8 kg    Examination:   Constitutional: NAD, AAOx3 HEENT: conjunctivae and lids normal, EOMI CV: No cyanosis.   RESP: normal respiratory effort, clear lung sounds, no wheezing Neuro: II - XII grossly intact.   Psych: Normal mood and affect.  Appropriate judgement and reason   Data Reviewed: I have personally reviewed labs and imaging studies  Time spent: 50 minutes  Darlin Priestly, MD [facility] If 7PM-7AM, please contact night-coverage 09/30/2023, 3:03 PM

## 2023-09-30 NOTE — Progress Notes (Signed)
  Echocardiogram 2D Echocardiogram has been performed. Definity IV ultrasound imaging agent used on this study.  Jean Gomez 09/30/2023, 8:58 AM

## 2023-09-30 NOTE — ED Notes (Signed)
Floor RN has not signed off on pt yet. RN called unit and they stated they will work on it, to get pt upstairs.

## 2023-09-30 NOTE — Progress Notes (Signed)
Cpap at bedside. Patient states she is up to bathroom room frequently due to lasix. To have EGD tomorrow. Decided to hold off on cpap due to frequent trips to bathroom

## 2023-09-30 NOTE — Consult Note (Signed)
Jean Gomez , MD [address], [address], [place], [place], [address] [address], [place], [place], [address] Phone: [phone]  Fax: [phone]  Consultation  Referring Provider: Emergency room Primary Care Physician:  Sherron Monday, MD Primary Gastroenterologist:  Dr. Mia Creek Reason for Consultation:    GI bleed  Date of Admission:  09/29/2023 Date of Consultation:  09/30/2023         HPI:   Jean Gomez is a 67 y.o. female sees Dr. Mia Creek as an outpatient last seen on 08/30/2023 history of gastric ulcer in 2021 in June 2024 had pulmonary embolism in July 2024 had generalized abdominal pain and nonbleeding gastric ulcers with a clean base were noted.  Biopsies showed chronic gastritis negative for H. pylori was placed on Protonix 40 mg twice daily.  Plan was for an upper endoscopy.  She presented to the ER for shortness of breath weakness intermittent diarrhea and constipation.  Recent episode of gastroenteritis in 09/07/2023.    His baseline hemoglobin is around 10 g.  MCV has been low.  On admission hemoglobin 7.9 g and this morning is 9.8 g.  B12 normal on 09/01/2023 and ferritin of 54.  Iron of 10 TIBC not elevated.  CT abdomen on admission showed no acute findings liquid stool identified in the proximal mid colon.  Prominent mesenteric lymph node.  She says she was told she had blood in her stool but has not seen any herself although she admits she didn't particularly look. She also denied any hematemesis. She was not sure of what arthritis medications she takes except for 81 asprin. Denies any use of blood thinners. Past Medical History:  Diagnosis Date   Acute postoperative pain 01/16/2019   Allergy    Asthma    Cerebral aneurysm    Chest pain 11/01/2016   Chronic migraine    Migraine   Chronic pain syndrome    Chronic respiratory failure (HCC)    Constipation    COPD (chronic obstructive pulmonary disease) (HCC)    Depression    major  depression in remission   Hypercholesterolemia    Hypertension    Osteoarthritis    Pneumonia    12/2018   Sarcoid    Seizures (HCC)    Sleep apnea     Past Surgical History:  Procedure Laterality Date   ABDOMINAL HYSTERECTOMY     BACK SURGERY     spine fusion anterior lumbar with open posterior   BIOPSY  07/02/2023   Procedure: BIOPSY;  Surgeon: Regis Bill, MD;  Location: [facility] ENDOSCOPY;  Service: Endoscopy;;   CHOLECYSTECTOMY     COLONOSCOPY WITH PROPOFOL N/A 08/19/2019   Procedure: COLONOSCOPY WITH PROPOFOL;  Surgeon: Christena Deem, MD;  Location: [facility] ENDOSCOPY;  Service: Endoscopy;  Laterality: N/A;   ESOPHAGOGASTRODUODENOSCOPY (EGD) WITH PROPOFOL N/A 08/19/2019   Procedure: ESOPHAGOGASTRODUODENOSCOPY (EGD) WITH PROPOFOL;  Surgeon: Christena Deem, MD;  Location: [facility] ENDOSCOPY;  Service: Endoscopy;  Laterality: N/A;   ESOPHAGOGASTRODUODENOSCOPY (EGD) WITH PROPOFOL N/A 08/04/2020   Procedure: ESOPHAGOGASTRODUODENOSCOPY (EGD) WITH PROPOFOL;  Surgeon: Earline Mayotte, MD;  Location: [facility] ENDOSCOPY;  Service: Endoscopy;  Laterality: N/A;   ESOPHAGOGASTRODUODENOSCOPY (EGD) WITH PROPOFOL N/A 11/05/2020   Procedure: ESOPHAGOGASTRODUODENOSCOPY (EGD) WITH PROPOFOL;  Surgeon: Earline Mayotte, MD;  Location: [facility] ENDOSCOPY;  Service: Endoscopy;  Laterality: N/A;   ESOPHAGOGASTRODUODENOSCOPY (EGD) WITH PROPOFOL N/A 07/02/2023   Procedure: ESOPHAGOGASTRODUODENOSCOPY (EGD) WITH PROPOFOL;  Surgeon: Regis Bill, MD;  Location: [facility] ENDOSCOPY;  Service: Endoscopy;  Laterality: N/A;   SPINAL FUSION      Prior to Admission medications   Medication Sig Start Date End Date Taking? Authorizing Provider  albuterol (PROVENTIL) (2.5 MG/3ML) 0.083% nebulizer solution Take 2.5 mg by nebulization every 6 (six) hours as needed for wheezing or shortness of breath.   Yes [provider]  Albuterol Sulfate 108 (90 Base) MCG/ACT AEPB Inhale 2 puffs into the lungs every 6 (six)  hours as needed.   Yes [provider]  ALPRAZolam (XANAX) 0.25 MG tablet Take 0.25 mg by mouth at bedtime as needed for sleep.   Yes [provider]  amLODipine-olmesartan (AZOR) 5-40 MG tablet TAKE 1 TABLET BY MOUTH EVERY MORNING 06/20/23  Yes Sherron Monday, MD  aspirin EC 81 MG tablet Take 1 tablet (81 mg total) by mouth daily. 04/19/18  Yes Clapacs, Jackquline Denmark, MD  atorvastatin (LIPITOR) 80 MG tablet TAKE 1 TABLET BY MOUTH NIGHTLY 09/10/23  Yes Tejan-[name], Marcelino Freestone, MD  calcium carbonate (OS-CAL) 1250 (500 Ca) MG chewable tablet Chew by mouth daily.   Yes [provider]  citalopram (CELEXA) 40 MG tablet TAKE ONE TABLET BY MOUTH EVERY MORNING 09/18/23  Yes Tejan-[name], Marcelino Freestone, MD  dicyclomine (BENTYL) 20 MG tablet Take 1 tablet (20 mg total) by mouth every 6 (six) hours as needed for up to 7 days for spasms. 06/26/23 09/30/23 Yes Ray, Danie Binder, MD  diphenhydrAMINE (BENADRYL) 25 MG tablet Take 25 mg by mouth every 6 (six) hours as needed.   Yes [provider]  donepezil (ARICEPT) 10 MG tablet TAKE ONE TABLET BY MOUTH EVERY MORNING 07/30/23  Yes Tejan-[name], Marcelino Freestone, MD  Fluticasone Furoate (ARNUITY ELLIPTA) 100 MCG/ACT AEPB INHALE 1 INHALATION INTO THE LUNGS ONCE DAILY. 09/18/23  Yes Tejan-[name], Marcelino Freestone, MD  hydrochlorothiazide (HYDRODIURIL) 25 MG tablet TAKE 1 TABLET BY MOUTH EVERY MORNING 06/25/23  Yes Tejan-[name], Marcelino Freestone, MD  indomethacin (INDOCIN) 50 MG capsule Take 50 mg by mouth 3 (three) times daily as needed.   Yes [provider]  lactulose (CHRONULAC) 10 GM/15ML solution Take 10 g by mouth 2 (two) times daily as needed for mild constipation.   Yes [provider]  levETIRAcetam (KEPPRA) 750 MG tablet TAKE ONE TABLET BY MOUTH EVERY MORNING 06/25/23  Yes Tejan-[name], Marcelino Freestone, MD  LINZESS 290 MCG CAPS capsule TAKE 1 CAPSULE BY MOUTH EVERY MORNING 07/09/23  Yes Sherron Monday, MD  mirtazapine (REMERON) 15 MG tablet Take 1 tablet (15 mg total) by mouth at  bedtime. 09/21/23  Yes Sherron Monday, MD  nitroGLYCERIN (NITROSTAT) 0.4 MG SL tablet Place 0.4 mg under the tongue every 5 (five) minutes as needed for chest pain.   Yes [provider]  Oxycodone HCl 10 MG TABS Take 10 mg by mouth 3 (three) times daily as needed.   Yes [provider]  pantoprazole (PROTONIX) 40 MG tablet TAKE ONE TABLET BY MOUTH EVERY MORNING 06/12/23  Yes Tejan-[name], Marcelino Freestone, MD  potassium chloride SA (KLOR-CON M) 20 MEQ tablet TAKE ONE TABLET BY MOUTH EVERY MORNING 09/18/23  Yes Sherron Monday, MD  promethazine (PHENERGAN) 25 MG tablet Take 1 tablet (25 mg total) by mouth every 6 (six) hours as needed for nausea or vomiting. 04/30/23 09/30/23 Yes Tejan-[name], Marcelino Freestone, MD  Suvorexant (BELSOMRA) 20 MG TABS TAKE ONE TABLET BY MOUTH AT BEDTIME 09/18/23  Yes Tejan-[name], Marcelino Freestone, MD  tiZANidine (ZANAFLEX) 4 MG tablet Take 1 tablet (4 mg total)  by mouth 3 (three) times daily. Patient taking differently: Take 4 mg by mouth 3 (three) times daily as needed. 03/29/20 09/30/23 Yes Delano Metz, MD  diclofenac Sodium (VOLTAREN) 1 % GEL Apply 4 g topically 4 (four) times daily as needed (Painful Joints). Patient not taking: Reported on 09/30/2023    [provider]  Elastic Bandages & Supports (MEDICAL COMPRESSION STOCKINGS) MISC Please provide compression stockings 12/30/18   Minna Antis, MD  sucralfate (CARAFATE) 1 g tablet TAKE ONE TABLET BY MOUTH FOUR TIMES DAILY BEFORE MEALS AND AT BEDTIME Patient not taking: Reported on 09/30/2023 03/20/23   Orson Eva, NP    Family History  Problem Relation Age of Onset   Dementia Mother    Breast cancer Mother    Throat cancer Father      Social History   Tobacco Use   Smoking status: Never   Smokeless tobacco: Never  Vaping Use   Vaping status: Never Used  Substance Use Topics   Alcohol use: Yes    Comment: rarely   Drug use: No    Allergies as of 09/29/2023 - Review Complete  09/29/2023  Allergen Reaction Noted   Penicillins Rash, Hives, and Itching 08/05/2014   Contrast media [iodinated contrast media] Swelling 07/04/2018   Gabapentin  02/07/2018   Labetalol Other (See Comments) 02/07/2018   Lactose intolerance (gi)  08/31/2023   Sulfabenzamide Nausea Only 02/07/2018   Pregabalin Other (See Comments) 11/26/2019    Review of Systems:    All systems reviewed and negative except where noted in HPI.   Physical Exam:  Vital signs in last 24 hours: Temp:  [98.2 F (36.8 C)-98.6 F (37 C)] 98.4 F (36.9 C) (10/12 2318) Pulse Rate:  [75-147] 117 (10/13 0330) Resp:  [11-34] 18 (10/13 0330) BP: (61-138)/(43-101) 132/92 (10/13 0330) SpO2:  [92 %-100 %] 97 % (10/13 0330) Weight:  [79.8 kg] 79.8 kg (10/12 1225)   General:   Pleasant, cooperative in NAD Head:  Normocephalic and atraumatic. Eyes:   No icterus.   Conjunctiva pink. PERRLA. Ears:  Normal auditory acuity. Neck:  Supple; no masses or thyroidomegaly Lungs: Respirations even and unlabored. Lungs clear to auscultation bilaterally.   No wheezes, crackles, or rhonchi.  Heart:  Regular rate and rhythm;  Without murmur, clicks, rubs or gallops Abdomen:  Soft, nondistended, nontender. Normal bowel sounds. No appreciable masses or hepatomegaly.  No rebound or guarding.  Neurologic:  Alert and oriented x3;  grossly normal neurologically. Skin:  Intact without significant lesions or rashes. Cervical Nodes:  No significant cervical adenopathy. Psych:  Alert and cooperative. Normal affect.  LAB RESULTS: Recent Labs    09/29/23 1237 09/30/23 0038  WBC 24.9* 29.0*  HGB 7.9* 9.8*  HCT 25.6* 32.1*  PLT 577* 520*   BMET Recent Labs    09/29/23 1237 09/30/23 0038  NA 137 142  K 4.3 3.9  CL 104 109  CO2 22 21*  GLUCOSE 175* 91  BUN 29* 20  CREATININE 1.91* 1.22*  CALCIUM 8.5* 7.9*   LFT Recent Labs    09/29/23 0038 09/30/23 0038  PROT 6.5 6.5  ALBUMIN 2.2* 2.3*  AST 19 20  ALT 15 16   ALKPHOS 124 116  BILITOT 0.4 0.4  BILIDIR <0.1  --   IBILI NOT CALCULATED  --    PT/INR No results for input(s): "LABPROT", "INR" in the last 72 hours.  STUDIES: CT ABDOMEN PELVIS WO CONTRAST  Result Date: 09/29/2023 CLINICAL DATA:  Sepsis. EXAM: CT  ABDOMEN AND PELVIS WITHOUT CONTRAST TECHNIQUE: Multidetector CT imaging of the abdomen and pelvis was performed following the standard protocol without IV contrast. RADIATION DOSE REDUCTION: This exam was performed according to the departmental dose-optimization program which includes automated exposure control, adjustment of the mA and/or kV according to patient size and/or use of iterative reconstruction technique. COMPARISON:  08/30/2023 FINDINGS: Lower chest: Emphysematous changes noted within the imaged portions of the lung bases. No pleural effusion or airspace consolidation. Hepatobiliary: No focal liver abnormality is seen. Status post cholecystectomy. No biliary dilatation. Pancreas: Unremarkable. No pancreatic ductal dilatation or surrounding inflammatory changes. Spleen: Normal in size without focal abnormality. Adrenals/Urinary Tract: Adrenal glands are unremarkable. Kidneys are normal, without renal calculi, focal lesion, or hydronephrosis. Bladder is unremarkable. Stomach/Bowel: Stomach is normal. The appendix is visualized and appears normal. No pathologic dilatation of the large or small bowel loops. Liquid and solid stool identified within the proximal and mid colon. No bowel wall thickening, inflammation or distension. Vascular/Lymphatic: Aortic atherosclerosis. No aneurysm. Prominent central mesenteric lymph nodes are identified measuring up to 1 cm. For example index lymph node measures 1 cm, image 37/3. Previously 0.9 cm. Lymph node ventral to the aortic bifurcation measures 1 cm, image 46/3. Formally 1.1 cm. No pelvic or inguinal adenopathy. Reproductive: Status post hysterectomy. No adnexal masses. Other: There is no ascites. No signs  of pneumoperitoneum. No discrete fluid collections. Musculoskeletal: No acute or significant osseous findings. No acute or suspicious osseous findings. Mild lumbar degenerative disc disease with first degree anterolisthesis of L5 on S1. Stable. IMPRESSION: 1. No acute findings within the abdomen or pelvis. 2. Liquid and solid stool identified within the proximal and mid colon. This is a nonspecific finding but can be seen in the setting of diarrheal illness. 3. Prominent central mesenteric lymph nodes are identified measuring up to 1 cm. These are nonspecific and may be reactive. These appear unchanged compared with 08/30/2023. 4. Aortic Atherosclerosis (ICD10-I70.0) and Emphysema (ICD10-J43.9). Electronically Signed   By: Signa Kell M.D.   On: 09/29/2023 14:44   DG Chest Port 1 View  Result Date: 09/29/2023 CLINICAL DATA:  67 year old female with possible sepsis. EXAM: PORTABLE CHEST 1 VIEW COMPARISON:  Chest x-ray 08/30/2023. FINDINGS: Diffuse interstitial prominence, peribronchial cuffing and areas of bronchiectasis and architectural distortion noted throughout the lungs bilaterally, with some upward retraction of hilar structures. No definite acute consolidative airspace disease. No pleural effusions. No pneumothorax. No evidence of pulmonary edema. Heart size is normal. Upper mediastinal contours are within normal limits. IMPRESSION: 1. No definite acute findings. Chronic lung changes, as above, similar to the prior study. These are nonspecific and could reflect areas of chronic post infectious or inflammatory scarring, or could be related to a systemic disease such as sarcoidosis. Electronically Signed   By: Trudie Reed M.D.   On: 09/29/2023 14:06      Impression / Plan:   MARIJANE TROWER is a 67 y.o. y/o female with a history of gastric ulcers presented to the ER with shortness of breath progressive weakness diarrhea and constipation going on for 3 to 4 weeks.  Hemoglobin lower than  baseline.  Suffers from chronic anemia.Pt unclear of her medications but she has had nsaids in her meds list so it is possible she has used NSAID's recently  Plan 1.  Monitor CBC transfuse as needed 2.  If having diarrhea get stool studies for GI PCR and C. Difficile 3.  AKI on admission improving continue fluid resuscitation 4.  IV PPI  5.  EGD tomorrow 6. Stop nsaids   I have discussed alternative options, risks & benefits,  which include, but are not limited to, bleeding, infection, perforation,respiratory complication & drug reaction.  The patient agrees with this plan & written consent will be obtained.     Thank you for involving me in the care of this patient.      LOS: 1 day   Jean Mood, MD  09/30/2023, 4:47 AM

## 2023-09-30 NOTE — ED Notes (Signed)
Pt states coming in because of abdominal pain and low blood pressure. Pt states tenderness to the abdomen.

## 2023-10-01 ENCOUNTER — Other Ambulatory Visit: Payer: Self-pay

## 2023-10-01 ENCOUNTER — Inpatient Hospital Stay: Payer: 59 | Admitting: Anesthesiology

## 2023-10-01 ENCOUNTER — Encounter: Payer: Self-pay | Admitting: Internal Medicine

## 2023-10-01 ENCOUNTER — Encounter
Admission: EM | Disposition: A | Discharge status: Home / Self Care | Payer: Self-pay | Source: Home / Self Care | Attending: Hospitalist

## 2023-10-01 DIAGNOSIS — K259 Gastric ulcer, unspecified as acute or chronic, without hemorrhage or perforation: Secondary | ICD-10-CM | POA: Diagnosis not present

## 2023-10-01 DIAGNOSIS — K922 Gastrointestinal hemorrhage, unspecified: Secondary | ICD-10-CM | POA: Diagnosis not present

## 2023-10-01 DIAGNOSIS — K449 Diaphragmatic hernia without obstruction or gangrene: Secondary | ICD-10-CM

## 2023-10-01 DIAGNOSIS — K253 Acute gastric ulcer without hemorrhage or perforation: Secondary | ICD-10-CM

## 2023-10-01 HISTORY — PX: ESOPHAGOGASTRODUODENOSCOPY (EGD) WITH PROPOFOL: SHX5813

## 2023-10-01 LAB — BASIC METABOLIC PANEL
Anion gap: 11 (ref 5–15)
BUN: 11 mg/dL (ref 8–23)
CO2: 22 mmol/L (ref 22–32)
Calcium: 8.6 mg/dL — ABNORMAL LOW (ref 8.9–10.3)
Chloride: 107 mmol/L (ref 98–111)
Creatinine, Ser: 0.97 mg/dL (ref 0.44–1.00)
GFR, Estimated: >60 mL/min (ref >60)
Glucose, Bld: 92 mg/dL (ref 70–99)
Potassium: 4 mmol/L (ref 3.5–5.1)
Sodium: 140 mmol/L (ref 135–145)

## 2023-10-01 LAB — CBC
HCT: 32.7 % — ABNORMAL LOW (ref 36.0–46.0)
Hemoglobin: 10.3 g/dL — ABNORMAL LOW (ref 12.0–15.0)
MCH: 24.5 pg — ABNORMAL LOW (ref 26.0–34.0)
MCHC: 31.5 g/dL (ref 30.0–36.0)
MCV: 77.9 fL — ABNORMAL LOW (ref 80.0–100.0)
Platelets: 494 10*3/uL — ABNORMAL HIGH (ref 150–400)
RBC: 4.2 MIL/uL (ref 3.87–5.11)
RDW: 20 % — ABNORMAL HIGH (ref 11.5–15.5)
WBC: 20.8 10*3/uL — ABNORMAL HIGH (ref 4.0–10.5)
nRBC: 0 % (ref 0.0–0.2)

## 2023-10-01 LAB — MAGNESIUM: Magnesium: 1.8 mg/dL (ref 1.7–2.4)

## 2023-10-01 LAB — PHOSPHORUS: Phosphorus: 3 mg/dL (ref 2.5–4.6)

## 2023-10-01 SURGERY — ESOPHAGOGASTRODUODENOSCOPY (EGD) WITH PROPOFOL
Anesthesia: General

## 2023-10-01 MED ORDER — PROPOFOL 500 MG/50ML IV EMUL
INTRAVENOUS | Status: DC | PRN
Start: 2023-10-01 — End: 2023-10-01
  Administered 2023-10-01 (×2): 50 mg via INTRAVENOUS

## 2023-10-01 MED ORDER — LIDOCAINE HCL (PF) 2 % IJ SOLN
INTRAMUSCULAR | Status: DC | PRN
Start: 2023-10-01 — End: 2023-10-01
  Administered 2023-10-01: 100 mg via INTRADERMAL

## 2023-10-01 MED ORDER — ACETAMINOPHEN 500 MG PO TABS
1000.0000 mg | ORAL_TABLET | Freq: Three times a day (TID) | ORAL | Status: DC | PRN
  Administered 2023-10-01 – 2023-10-02 (×2): 1000 mg via ORAL
  Filled 2023-10-01 (×2): qty 2

## 2023-10-01 NOTE — Progress Notes (Addendum)
PROGRESS NOTE    Jean Gomez  LOV:[id number] DOB: August 20, 1956 DOA: 09/29/2023 PCP: Sherron Monday, MD  124A/124A-AA  LOS: 2 days   Brief hospital course:   Assessment & Plan: Jean Gomez is a 67 y.o. female with medical history significant for sarcoidosis omn oxygen - chronic respiratory failure on 3 L, OSA on CPAP, COPD, asthma , depression, hyperlipidemia, seizure, GIB,seizures, cerebral aneurysm, HDL, depression, osteoarthritis , Gastric Ulcer on egd in July 2024 coming with generalized weakness dizziness, falls , abdominal pain- generalized , and diarrhea.  In the ED, pt was hypotensive in 60's, which improved with IVF and 2u pRBC.  Hypotension due to hypovolemia Ruled out shock --BP improved with IVF --oral hydration now --Hold home amlodipine, hydrochlorothiazide.  Acute on chronic anemia --received 2u pRBC on presentation for Hgb 7.9 due to initial concern for shock. --Monitor Hgb and transfuse to keep Hgb >7  Possible GI bleed Hx of gastric ulcer --EGD today found clean-based ulcer seen in the pyloric channel  --cont PPI BID --repeat EGD in 6 weeks with Dr. Mia Creek --avoid NSAIDs  N/V/D and abdominal pain --symptoms going on for 3-4 weeks.  CT a/p showed Liquid and solid stool but no acute finding. --C diff and GI path neg  AKI likely ATN --Cr 1.91 on presentation, with baseline around 0.6.  Due to dehydration and hypotension.  Cr improved with MIVF. --oral hydration now  >>General weakness: --due to above --PT   COPD Sarcoidosis Chronic hypoxemic respiratory failure on 3L O2 --reported more dyspnea, however, no increased O2 requirement, lungs sounded clear. --cont home 3L O2    >>Seizure disorder: Continue keppra 750 mg po q 12h. Seizure precaution.   Sepsis, ruled out --no clear source of infection   DVT prophylaxis: SCD/Compression stockings Code Status: Full code  Family Communication:  Level of care: Med-Surg Dispo:   The  patient is from: home Anticipated d/c is to: home Anticipated d/c date is: 1-2 days   Subjective and Interval History:  Pt underwent EGD today.  Continued to have nausea.     Objective: Vitals:   10/01/23 1210 10/01/23 1247 10/01/23 1335 10/01/23 1617  BP: 122/80  (!) 152/99 (!) 147/86  Pulse: 97  94   Resp: 18 18 20 16   Temp: (!) 96.5 F (35.8 C) (!) 96.5 F (35.8 C) 99.7 F (37.6 C) 98.4 F (36.9 C)  TempSrc: Temporal Temporal Oral   SpO2: 100%  100% 100%  Weight:      Height:        Intake/Output Summary (Last 24 hours) at 10/01/2023 1830 Last data filed at 10/01/2023 1612 Gross per 24 hour  Intake 349.34 ml  Output 1 ml  Net 348.34 ml   Filed Weights   09/29/23 1225  Weight: 79.8 kg    Examination:   Constitutional: NAD, AAOx3 HEENT: conjunctivae and lids normal, EOMI CV: No cyanosis.   RESP: normal respiratory effort, on RA Neuro: II - XII grossly intact.   Psych: depressed mood and affect.  Appropriate judgement and reason   Data Reviewed: I have personally reviewed labs and imaging studies  Time spent: 35 minutes  Darlin Priestly, MD [facility] If 7PM-7AM, please contact night-coverage 10/01/2023, 6:30 PM

## 2023-10-01 NOTE — Anesthesia Preprocedure Evaluation (Addendum)
Anesthesia Evaluation  Patient identified by MRN, date of birth, ID band Patient awake    Reviewed: Allergy & Precautions, NPO status , Patient's Chart, lab work & pertinent test results  History of Anesthesia Complications Negative for: history of anesthetic complications  Airway Mallampati: II   Neck ROM: Full    Dental  (+) Edentulous Upper, Edentulous Lower   Pulmonary asthma , sleep apnea and Continuous Positive Airway Pressure Ventilation , COPD (3L home O2),  oxygen dependent Sarcoidosis    Pulmonary exam normal breath sounds clear to auscultation       Cardiovascular hypertension, Normal cardiovascular exam Rhythm:Regular Rate:Normal  ECG 09/29/23:  Sinus rhythm Low voltage, precordial leads Nonspecific T abnormalities, anterior leads   Neuro/Psych  Headaches, Seizures -,  PSYCHIATRIC DISORDERS Anxiety Depression Bipolar Disorder   Chronic pain; cerebral aneurysm    GI/Hepatic hiatal hernia, PUD,GERD  ,,  Endo/Other  Obesity   Renal/GU      Musculoskeletal  (+) Arthritis ,    Abdominal   Peds  Hematology  (+) Blood dyscrasia, anemia   Anesthesia Other Findings   Reproductive/Obstetrics                             Anesthesia Physical Anesthesia Plan  ASA: 3  Anesthesia Plan: General   Post-op Pain Management:    Induction: Intravenous  PONV Risk Score and Plan: 3 and Propofol infusion, TIVA and Treatment may vary due to age or medical condition  Airway Management Planned: Natural Airway  Additional Equipment:   Intra-op Plan:   Post-operative Plan:   Informed Consent: I have reviewed the patients History and Physical, chart, labs and discussed the procedure including the risks, benefits and alternatives for the proposed anesthesia with the patient or authorized representative who has indicated his/her understanding and acceptance.       Plan Discussed with:  CRNA  Anesthesia Plan Comments: (LMA/GETA backup discussed.  Patient consented for risks of anesthesia including but not limited to:  - adverse reactions to medications - damage to eyes, teeth, lips or other oral mucosa - nerve damage due to positioning  - sore throat or hoarseness - damage to heart, brain, nerves, lungs, other parts of body or loss of life  Informed patient about role of CRNA in peri- and intra-operative care.  Patient voiced understanding.)        Anesthesia Quick Evaluation

## 2023-10-01 NOTE — Transfer of Care (Signed)
Immediate Anesthesia Transfer of Care Note  Patient: Jean Gomez  Procedure(s) Performed: ESOPHAGOGASTRODUODENOSCOPY (EGD) WITH PROPOFOL  Patient Location: PACU  Anesthesia Type:General  Level of Consciousness: awake and patient cooperative  Airway & Oxygen Therapy: Patient Spontanous Breathing and Patient connected to nasal cannula oxygen  Post-op Assessment: Report given to RN and Post -op Vital signs reviewed and stable  Post vital signs: stable, 4L via  attached to wall O2.  Last Vitals:  Vitals Value Taken Time  BP    Temp    Pulse 104 10/01/23 1247  Resp 24 10/01/23 1247  SpO2 98 % 10/01/23 1247  Vitals shown include unfiled device data.  Last Pain:  Vitals:   10/01/23 1210  TempSrc: Temporal  PainSc: 0-No pain      Patients Stated Pain Goal: 0 (09/30/23 2231)  Complications: No notable events documented.

## 2023-10-01 NOTE — Progress Notes (Signed)
Pt stated she was not going to wear CPAP tonight because she was using the bathroom too much. RN at bedside. CPAP remains at bedside.

## 2023-10-01 NOTE — Plan of Care (Signed)
  Problem: Clinical Measurements: Goal: Diagnostic test results will improve Outcome: Not Progressing   Problem: Respiratory: Goal: Ability to maintain adequate ventilation will improve Outcome: Not Progressing   Problem: Fluid Volume: Goal: Ability to maintain a balanced intake and output will improve Outcome: Not Progressing   Problem: Pain Managment: Goal: General experience of comfort will improve Outcome: Not Progressing

## 2023-10-01 NOTE — Progress Notes (Signed)
The patient had an upper endoscopy the clean-based ulcer seen in the pyloric channel.  No active bleeding or no visible vessels.  This is very unlikely to have any further bleeding.  The patient should stay away from NSAIDs and she should be put on a PPI twice a day and follow-up for a repeat EGD in 6 weeks with Dr. Mia Creek her primary gastroenterologist.  I will sign off.  Please call if any further GI concerns or questions.  We would like to thank you for the opportunity to participate in the care of Jean [name]

## 2023-10-01 NOTE — Op Note (Signed)
[facility] Gastroenterology Patient Name: Jean Gomez Procedure Date: 10/01/2023 12:36 PM MRN: [id number] Account #: [id number] Date of Birth: May 08, 1956 Admit Type: Outpatient Age: 67 Room: [facility] ENDO ROOM 2 Gender: Female Note Status: Finalized Instrument Name: Upper Endoscope [id number] Procedure:             Upper GI endoscopy Indications:           Melena Providers:             Midge Minium MD, MD Referring MD:          No Local Md, MD (Referring MD) Medicines:             Propofol per Anesthesia Complications:         No immediate complications. Procedure:             Pre-Anesthesia Assessment:                        - Prior to the procedure, a History and Physical was                         performed, and patient medications and allergies were                         reviewed. The patient's tolerance of previous                         anesthesia was also reviewed. The risks and benefits                         of the procedure and the sedation options and risks                         were discussed with the patient. All questions were                         answered, and informed consent was obtained. Prior                         Anticoagulants: The patient has taken no anticoagulant                         or antiplatelet agents. ASA Grade Assessment: II - A                         patient with mild systemic disease. After reviewing                         the risks and benefits, the patient was deemed in                         satisfactory condition to undergo the procedure.                        After obtaining informed consent, the endoscope was                         passed under direct vision. Throughout the procedure,  the patient's blood pressure, pulse, and oxygen                         saturations were monitored continuously. The Endoscope                         was introduced through the mouth, and advanced to the                          second part of duodenum. The upper GI endoscopy was                         accomplished without difficulty. The patient tolerated                         the procedure well. Findings:      A small hiatal hernia was present.      One non-bleeding cratered gastric ulcer with a clean ulcer base (Forrest       Class III) was found at the pylorus. The lesion was 8 mm in largest       dimension.      The examined duodenum was normal. Impression:            - Small hiatal hernia.                        - Non-bleeding gastric ulcer with a clean ulcer base                         (Forrest Class III).                        - Normal examined duodenum.                        - No specimens collected. Recommendation:        - Return patient to hospital ward for ongoing care.                        - Resume regular diet.                        - Continue present medications.                        - Use a proton pump inhibitor PO BID.                        - Repeat upper endoscopy in 6 weeks to evaluate the                         response to therapy. Procedure Code(s):     --- Professional ---                        [phone], Esophagogastroduodenoscopy, flexible,                         transoral; diagnostic, including collection of  specimen(s) by brushing or washing, when performed                         (separate procedure) Diagnosis Code(s):     --- Professional ---                        K92.1, Melena (includes Hematochezia)                        K25.9, Gastric ulcer, unspecified as acute or chronic,                         without hemorrhage or perforation CPT copyright 2022 American Medical Association. All rights reserved. The codes documented in this report are preliminary and upon coder review may  be revised to meet current compliance requirements. Midge Minium MD, MD 10/01/2023 12:43:43 PM This report has been signed  electronically. Number of Addenda: 0 Note Initiated On: 10/01/2023 12:36 PM Estimated Blood Loss:  Estimated blood loss: none.      [facility]

## 2023-10-01 NOTE — Anesthesia Postprocedure Evaluation (Signed)
Anesthesia Post Note  Patient: Jean Gomez  Procedure(s) Performed: ESOPHAGOGASTRODUODENOSCOPY (EGD) WITH PROPOFOL  Patient location during evaluation: PACU Anesthesia Type: General Level of consciousness: awake and alert, oriented and patient cooperative Pain management: pain level controlled Vital Signs Assessment: post-procedure vital signs reviewed and stable Respiratory status: spontaneous breathing, nonlabored ventilation and respiratory function stable Cardiovascular status: blood pressure returned to baseline and stable Postop Assessment: adequate PO intake Anesthetic complications: no   There were no known notable events for this encounter.   Last Vitals:  Vitals:   10/01/23 1210 10/01/23 1247  BP: 122/80   Pulse: 97   Resp: 18 18  Temp: (!) 35.8 C (!) 35.8 C  SpO2: 100%     Last Pain:  Vitals:   10/01/23 1307  TempSrc:   PainSc: 0-No pain                 Reed Breech

## 2023-10-02 ENCOUNTER — Encounter: Payer: Self-pay | Admitting: Gastroenterology

## 2023-10-02 DIAGNOSIS — K922 Gastrointestinal hemorrhage, unspecified: Secondary | ICD-10-CM | POA: Diagnosis not present

## 2023-10-02 LAB — CBC
HCT: 33.2 % — ABNORMAL LOW (ref 36.0–46.0)
Hemoglobin: 10.4 g/dL — ABNORMAL LOW (ref 12.0–15.0)
MCH: 24.7 pg — ABNORMAL LOW (ref 26.0–34.0)
MCHC: 31.3 g/dL (ref 30.0–36.0)
MCV: 78.9 fL — ABNORMAL LOW (ref 80.0–100.0)
Platelets: 520 10*3/uL — ABNORMAL HIGH (ref 150–400)
RBC: 4.21 MIL/uL (ref 3.87–5.11)
RDW: 19.9 % — ABNORMAL HIGH (ref 11.5–15.5)
WBC: 16.3 10*3/uL — ABNORMAL HIGH (ref 4.0–10.5)
nRBC: 0 % (ref 0.0–0.2)

## 2023-10-02 LAB — BASIC METABOLIC PANEL
Anion gap: 10 (ref 5–15)
BUN: 10 mg/dL (ref 8–23)
CO2: 24 mmol/L (ref 22–32)
Calcium: 8.2 mg/dL — ABNORMAL LOW (ref 8.9–10.3)
Chloride: 104 mmol/L (ref 98–111)
Creatinine, Ser: 0.87 mg/dL (ref 0.44–1.00)
GFR, Estimated: >60 mL/min (ref >60)
Glucose, Bld: 73 mg/dL (ref 70–99)
Potassium: 3.8 mmol/L (ref 3.5–5.1)
Sodium: 138 mmol/L (ref 135–145)

## 2023-10-02 LAB — MAGNESIUM: Magnesium: 1.8 mg/dL (ref 1.7–2.4)

## 2023-10-02 LAB — C-REACTIVE PROTEIN: CRP: 13.4 mg/dL — ABNORMAL HIGH (ref <1.0)

## 2023-10-02 MED ORDER — IRBESARTAN 150 MG PO TABS
300.0000 mg | ORAL_TABLET | Freq: Every day | ORAL | Status: DC
  Administered 2023-10-03: 300 mg via ORAL
  Filled 2023-10-02: qty 2

## 2023-10-02 MED ORDER — AMLODIPINE BESYLATE 5 MG PO TABS
5.0000 mg | ORAL_TABLET | Freq: Every day | ORAL | Status: DC
  Administered 2023-10-03: 5 mg via ORAL
  Filled 2023-10-02: qty 1

## 2023-10-02 MED ORDER — OXYCODONE HCL 5 MG PO TABS
10.0000 mg | ORAL_TABLET | Freq: Three times a day (TID) | ORAL | Status: DC | PRN
  Administered 2023-10-02 – 2023-10-03 (×3): 10 mg via ORAL
  Filled 2023-10-02 (×3): qty 2

## 2023-10-02 MED ORDER — AMLODIPINE-OLMESARTAN 5-40 MG PO TABS: 1.0000 | ORAL_TABLET | Freq: Every morning | ORAL | Status: DC

## 2023-10-02 MED ORDER — PROCHLORPERAZINE EDISYLATE 10 MG/2ML IJ SOLN: 5.0000 mg | Freq: Four times a day (QID) | INTRAMUSCULAR | Status: DC | PRN

## 2023-10-02 MED ORDER — PANTOPRAZOLE SODIUM 40 MG PO TBEC
40.0000 mg | DELAYED_RELEASE_TABLET | Freq: Two times a day (BID) | ORAL | Status: DC
  Administered 2023-10-02 – 2023-10-03 (×3): 40 mg via ORAL
  Filled 2023-10-02 (×3): qty 1

## 2023-10-02 NOTE — Progress Notes (Signed)
PROGRESS NOTE    Jean Gomez  ZOX:[id number] DOB: 13-Nov-1956 DOA: 09/29/2023 PCP: Sherron Monday, MD  124A/124A-AA  LOS: 3 days   Brief hospital course:   Assessment & Plan: Jean Gomez is a 67 y.o. female with medical history significant for sarcoidosis, chronic respiratory failure on 3 L, OSA, COPD, asthma , depression, seizure, cerebral aneurysm, gastric ulcer on egd in July 2024 coming with generalized weakness dizziness, falls , generalized abdominal pain, and diarrhea.  In the ED, pt was hypotensive in 60's, which improved with IVF and 2u pRBC.  Hypotension due to hypovolemia Ruled out shock --BP improved with IVF --oral hydration now  Acute on chronic anemia --received 2u pRBC on presentation for Hgb 7.9 due to initial concern for shock. --Monitor Hgb and transfuse to keep Hgb >7  Possible GI bleed Hx of gastric ulcer --EGD found clean-based ulcer seen in the pyloric channel  --transition from PPI gtt to oral PPI BID --repeat EGD in 6 weeks with Dr. Mia Creek --avoid NSAIDs  N/V/D and abdominal pain --symptoms going on for 3-4 weeks.  CT a/p showed Liquid and solid stool but no acute finding. --C diff and GI path neg  AKI likely ATN --Cr 1.91 on presentation, with baseline around 0.6.  Due to dehydration and hypotension.  Cr improved with MIVF. --oral hydration now  HTN --home amlodipine/olmesartan, hydrochlorothiazide held on presentation due to hypotension. --BP now trending up --resume amlodipine/olmesartan  >>General weakness: --due to above --PT   COPD Sarcoidosis Chronic hypoxemic respiratory failure on 3L O2 --reported more dyspnea, however, no increased O2 requirement, lungs sounded clear. --cont home 3L O2    >>Seizure disorder: Continue keppra 750 mg po q12h. Seizure precaution.   Sepsis, ruled out --no clear source of infection   DVT prophylaxis: SCD/Compression stockings Code Status: Full code  Family Communication:   Level of care: Med-Surg Dispo:   The patient is from: home Anticipated d/c is to: home Anticipated d/c date is: likely tomorrow   Subjective and Interval History:  Pt reported feeling much better today.  Soft stool, no diarrhea.     Objective: Vitals:   10/01/23 2016 10/02/23 0550 10/02/23 0737 10/02/23 1520  BP: (!) 142/85 127/75 (!) 140/89 (!) 145/97  Pulse: (!) 104 (!) 107 (!) 108 84  Resp:   17 18  Temp: 98.8 F (37.1 C) 98.1 F (36.7 C) 98.8 F (37.1 C) 98.7 F (37.1 C)  TempSrc: Oral Oral    SpO2: 100% 100% 100% 100%  Weight:      Height:        Intake/Output Summary (Last 24 hours) at 10/02/2023 1900 Last data filed at 10/02/2023 1800 Gross per 24 hour  Intake 175.59 ml  Output --  Net 175.59 ml   Filed Weights   09/29/23 1225  Weight: 79.8 kg    Examination:   Constitutional: NAD, AAOx3 HEENT: conjunctivae and lids normal, EOMI CV: No cyanosis.   RESP: normal respiratory effort Neuro: II - XII grossly intact.   Psych: better mood and affect.  Appropriate judgement and reason   Data Reviewed: I have personally reviewed labs and imaging studies  Time spent: 35 minutes  Darlin Priestly, MD [facility] If 7PM-7AM, please contact night-coverage 10/02/2023, 7:00 PM

## 2023-10-02 NOTE — Evaluation (Addendum)
Physical Therapy Evaluation Patient Details Name: ROCELYN TURSO MRN: [id number] DOB: 05/16/1956 Today's Date: 10/02/2023  History of Present Illness  Randi KEITLYN BONNER is a 67 y.o. female with medical history significant of sarcoidosis, chronic pain, chronic respiratory failure on 2 L, depression, hyperlipidemia, seizure disorder presenting with sepsis, diarrhea.  Patient reports several weeks to months of recurring nonbilious nonbloody diarrhea.  Symptoms have progressively worsened especially over the past few weeks.    Clinical Impression  Pt received seated in recliner upon arrival to room and pt agreeable to therapy.  Pt reports having family support at home and is ready to go home.  Pt performed all transfers without any complication.  Pt then ambulated around the nursing station without any adverse reactions as well.  Pt very stable with walker and notes she does not have stairs to navigate once home as she has a ramp.  Pt left in recliner with all needs met and call bell within reach.  Nursing in room upon leaving the room.  Patient is at baseline, all education completed, and time is given to address all questions/concerns. No additional skilled PT services needed at this time, PT signing off. PT recommends daily ambulation ad lib or with nursing staff as needed to prevent deconditioning.        If plan is discharge home, recommend the following: A little help with walking and/or transfers;A little help with bathing/dressing/bathroom;Help with stairs or ramp for entrance   Can travel by private vehicle        Equipment Recommendations None recommended by PT  Recommendations for Other Services       Functional Status Assessment Patient has had a recent decline in their functional status and demonstrates the ability to make significant improvements in function in a reasonable and predictable amount of time.     Precautions / Restrictions Precautions Precautions:  Fall Restrictions Weight Bearing Restrictions: No      Mobility  Bed Mobility Overal bed mobility: Modified Independent                  Transfers Overall transfer level: Modified independent Equipment used: Rolling walker (2 wheels)                    Ambulation/Gait Ambulation/Gait assistance: Modified independent (Device/Increase time) Gait Distance (Feet): 200 Feet Assistive device: Rolling walker (2 wheels) Gait Pattern/deviations: WFL(Within Functional Limits) Gait velocity: decreased     General Gait Details: Pt able to ambulate well with the RW, with slowed and steady gait pattern.  Stairs            Wheelchair Mobility     Tilt Bed    Modified Rankin (Stroke Patients Only)       Balance Overall balance assessment: Modified Independent                                           Pertinent Vitals/Pain Pain Assessment Pain Assessment: No/denies pain    Home Living Family/patient expects to be discharged to:: Private residence Living Arrangements: Children Available Help at Discharge: Family;Available PRN/intermittently Type of Home: Apartment Home Access: Level entry       Home Layout: One level Home Equipment: Cane - single point;Shower seat;Rollator (4 wheels)      Prior Function Prior Level of Function : Independent/Modified Independent;Driving  Mobility Comments: SPC or less for AMB in home. ADLs Comments: She is Ind in self care tasks and most IADLs and still drives. Daughter comes to check on her at night and she goes to stay with daugher on the weekends.     Extremity/Trunk Assessment   Upper Extremity Assessment Upper Extremity Assessment: Overall WFL for tasks assessed    Lower Extremity Assessment Lower Extremity Assessment: Overall WFL for tasks assessed       Communication      Cognition Arousal: Alert Behavior During Therapy: WFL for tasks assessed/performed Overall  Cognitive Status: Within Functional Limits for tasks assessed                                          General Comments      Exercises     Assessment/Plan    PT Assessment Patient does not need any further PT services  PT Problem List         PT Treatment Interventions      PT Goals (Current goals can be found in the Care Plan section)  Acute Rehab PT Goals Patient Stated Goal: to go back home with family. PT Goal Formulation: With patient Time For Goal Achievement: 10/02/23 Potential to Achieve Goals: Good    Frequency       Co-evaluation               AM-PAC PT "6 Clicks" Mobility  Outcome Measure Help needed turning from your back to your side while in a flat bed without using bedrails?: A Little Help needed moving from lying on your back to sitting on the side of a flat bed without using bedrails?: A Little Help needed moving to and from a bed to a chair (including a wheelchair)?: A Little Help needed standing up from a chair using your arms (e.g., wheelchair or bedside chair)?: A Little Help needed to walk in hospital room?: A Little Help needed climbing 3-5 steps with a railing? : A Little 6 Click Score: 18    End of Session Equipment Utilized During Treatment: Gait belt Activity Tolerance: Patient tolerated treatment well Patient left: in chair;with call bell/phone within reach;with chair alarm set Nurse Communication: Mobility status PT Visit Diagnosis: Unsteadiness on feet (R26.81);Other abnormalities of gait and mobility (R26.89);Muscle weakness (generalized) (M62.81);Difficulty in walking, not elsewhere classified (R26.2)    Time: 1478-2956 PT Time Calculation (min) (ACUTE ONLY): 15 min   Charges:   PT Evaluation $PT Eval Low Complexity: 1 Low PT Treatments $Therapeutic Activity: 8-22 mins PT General Charges $$ ACUTE PT VISIT: 1 Visit         Nolon Bussing, PT, DPT Physical Therapist - [facility]  10/02/23, 3:30 PM

## 2023-10-02 NOTE — Care Management Important Message (Signed)
Important Message  Patient Details  Name: Jean Gomez MRN: [id number] Date of Birth: 17-Jul-1956   Important Message Given:  Yes - Medicare IM     Bernadette Hoit 10/02/2023, 11:30 AM

## 2023-10-03 ENCOUNTER — Telehealth: Payer: Self-pay | Admitting: *Deleted

## 2023-10-03 ENCOUNTER — Other Ambulatory Visit: Payer: Self-pay | Admitting: *Deleted

## 2023-10-03 ENCOUNTER — Other Ambulatory Visit: Payer: Self-pay

## 2023-10-03 DIAGNOSIS — A419 Sepsis, unspecified organism: Secondary | ICD-10-CM

## 2023-10-03 DIAGNOSIS — K922 Gastrointestinal hemorrhage, unspecified: Secondary | ICD-10-CM | POA: Diagnosis not present

## 2023-10-03 MED ORDER — OXYCODONE HCL 10 MG PO TABS: 10.0000 mg | ORAL_TABLET | Freq: Three times a day (TID) | ORAL | 0 refills | Status: AC | PRN

## 2023-10-03 MED ORDER — PANTOPRAZOLE SODIUM 40 MG PO TBEC: 40.0000 mg | DELAYED_RELEASE_TABLET | Freq: Two times a day (BID) | ORAL | 0 refills | Status: DC

## 2023-10-03 NOTE — Progress Notes (Signed)
Care Coordination  Outreach Note  10/03/2023 Name: Jean Gomez MRN: [id number] DOB: [birth year]   Care Coordination Outreach Attempts: An unsuccessful telephone outreach was attempted today to offer the patient information about available care coordination services.  Follow Up Plan:  Additional outreach attempts will be made to offer the patient care coordination information and services.   Encounter Outcome:  No Answer  Burman Nieves, CCMA Care Coordination Care Guide Direct Dial: [phone]

## 2023-10-03 NOTE — TOC Progression Note (Signed)
Transition of Care [facility]) - Progression Note    Patient Details  Name: Jean Gomez MRN: [id number] Date of Birth: [birth year]  Transition of [facility]) CM/SW Contact  Garret Reddish, RN Phone Number: 10/03/2023, 11:47 AM  Clinical Narrative:     Chart reviewed.  Noted that patient has orders for discharge today.  Patient has home 02 with [employer].  Patient with no TOC identified.           Expected Discharge Plan and Services         Expected Discharge Date: 10/03/23                                     Social Determinants of Health (SDOH) Interventions SDOH Screenings   Food Insecurity: No Food Insecurity (09/30/2023)  Housing: Low Risk  (09/30/2023)  Transportation Needs: No Transportation Needs (09/30/2023)  Utilities: Not At Risk (09/30/2023)  Alcohol Screen: Low Risk  (04/17/2018)  Depression (PHQ2-9): Low Risk  (08/26/2020)  Financial Resource Strain: Low Risk  (03/05/2018)  Physical Activity: Unknown (03/05/2018)  Social Connections: Unknown (03/05/2018)  Stress: No Stress Concern Present (03/05/2018)  Tobacco Use: Low Risk  (10/01/2023)    Readmission Risk Interventions     No data to display

## 2023-10-03 NOTE — Consult Note (Signed)
[facility] Customer service manager [facility]) Accountable Care Organization (ACO) [facility] Liaison Note  10/03/2023  [name] 08-01-1956 [id number]  Location: [facility] RN Hospital Liaison screened the patient remotely at [facility].  Insurance: [employer]   Jean Gomez is a 67 y.o. female who is a Primary Care Patient of Tejan-[name], Marcelino Freestone, MD -[facility]. The patient was screened for 30 day readmission hospitalization with noted medium risk score for unplanned readmission risk with 2 IP/1 ED in 6 months.  The patient was assessed for potential [facility]) Care Management service needs for post hospital transition for care coordination. Review of patient's electronic medical record reveals patient was admitted with Sepsis. Spoke with pt concerning care management (receptive to a referral). Liaison will make a referral for a care coordinator to complete a post hospital prevention readmission follow up call for management of care. Pt states she has a good support system in the home and she is wearing her home oxygen with no problems.    Plan: [facility] Liaison will continue to follow progress and disposition to asess for post hospital community care coordination/management needs.  Referral request for community care coordination: Liaison will make a referral for a nurse care coordinator for post hospital prevention readmission follow up.   [facility] Care Management/Population Health does not replace or interfere with any arrangements made by the Inpatient Transition of Care team.   For questions contact:   Elliot Cousin, RN, [facility] Liaison [place]   Population Health Office Hours MTWF  8:00 am-6:00 pm [phone] mobile [phone] [Office toll free line] Office Hours are M-F 8:30 - 5 pm [email or web address]

## 2023-10-03 NOTE — Discharge Summary (Signed)
1 tablet (25 mg total) by mouth every 6 (six) hours as needed for nausea or vomiting.   sucralfate 1 g tablet Commonly known as: CARAFATE TAKE ONE TABLET BY MOUTH FOUR TIMES DAILY BEFORE MEALS AND AT BEDTIME   tiZANidine 4 MG tablet Commonly known as: ZANAFLEX Take 1 tablet (4 mg total) by mouth 3 (three) times daily. What changed:  when to take this reasons to take this        Follow-up Information     Locklear, Rossie Muskrat, MD Follow up in 6 week(s).   Specialty: Gastroenterology Contact information: [address] [place] [address] [phone]         Sherron Monday, MD Follow up.   Specialty: Internal Medicine Why: F/u in 1-2 weeks Contact information: [address] [place] [address] [phone]                Allergies  Allergen Reactions   Penicillins Rash, Hives and Itching   Contrast Media [Iodinated Contrast Media] Swelling   Gabapentin    Labetalol Other (See Comments)    Made hair fall out   Lactose Intolerance (Gi)     Bloating and diarrhea    Sulfabenzamide Nausea Only   Pregabalin Other (See Comments)    The  patient complained of hemifacial numbness with the use of the medication.    Consultations: GI    Procedures/Studies: ECHOCARDIOGRAM COMPLETE BUBBLE STUDY  Result Date: 09/30/2023    ECHOCARDIOGRAM REPORT   Patient Name:   Jean Gomez Date of Exam: 09/30/2023 Medical Rec #:  [id number]        Height:       63.0 in Accession #:    [id number]       Weight:       176.0 lb Date of Birth:  1956/10/02        BSA:          1.831 m Patient Age:    67 years         BP:           128/82 mmHg Patient Gender: F                HR:           104 bpm. Exam Location:  [facility] Procedure: 2D Echo, Saline Contrast Bubble Study and Intracardiac Opacification            Agent Indications:     CHF  History:         Patient has no prior history of Echocardiogram examinations.  Sonographer:     Overton Mam RDCS, FASE Referring Phys:  [id number] Eliezer Mccoy PATEL Diagnosing Phys: Lennie Odor MD  Sonographer Comments: Technically difficult study due to poor echo windows, suboptimal parasternal window and suboptimal apical window. Image acquisition challenging due to respiratory motion. IMPRESSIONS  1. Left ventricular ejection fraction, by estimation, is 65 to 70%. The left ventricle has normal function. The left ventricle has no regional wall motion abnormalities. There is mild concentric left ventricular hypertrophy. Indeterminate diastolic filling due to E-A fusion.  2. Right ventricular systolic function is normal. The right ventricular size is normal. There is moderately elevated pulmonary artery systolic pressure. The estimated right ventricular systolic pressure is 47.6 mmHg.  3. The mitral valve is grossly normal. Trivial mitral valve regurgitation. No evidence of mitral stenosis.  4. The aortic valve is grossly normal. Aortic valve regurgitation is not visualized. No aortic stenosis is present.  1 tablet (25 mg total) by mouth every 6 (six) hours as needed for nausea or vomiting.   sucralfate 1 g tablet Commonly known as: CARAFATE TAKE ONE TABLET BY MOUTH FOUR TIMES DAILY BEFORE MEALS AND AT BEDTIME   tiZANidine 4 MG tablet Commonly known as: ZANAFLEX Take 1 tablet (4 mg total) by mouth 3 (three) times daily. What changed:  when to take this reasons to take this        Follow-up Information     Locklear, Rossie Muskrat, MD Follow up in 6 week(s).   Specialty: Gastroenterology Contact information: [address] [place] [address] [phone]         Sherron Monday, MD Follow up.   Specialty: Internal Medicine Why: F/u in 1-2 weeks Contact information: [address] [place] [address] [phone]                Allergies  Allergen Reactions   Penicillins Rash, Hives and Itching   Contrast Media [Iodinated Contrast Media] Swelling   Gabapentin    Labetalol Other (See Comments)    Made hair fall out   Lactose Intolerance (Gi)     Bloating and diarrhea    Sulfabenzamide Nausea Only   Pregabalin Other (See Comments)    The  patient complained of hemifacial numbness with the use of the medication.    Consultations: GI    Procedures/Studies: ECHOCARDIOGRAM COMPLETE BUBBLE STUDY  Result Date: 09/30/2023    ECHOCARDIOGRAM REPORT   Patient Name:   Jean Gomez Date of Exam: 09/30/2023 Medical Rec #:  [id number]        Height:       63.0 in Accession #:    [id number]       Weight:       176.0 lb Date of Birth:  1956/10/02        BSA:          1.831 m Patient Age:    67 years         BP:           128/82 mmHg Patient Gender: F                HR:           104 bpm. Exam Location:  [facility] Procedure: 2D Echo, Saline Contrast Bubble Study and Intracardiac Opacification            Agent Indications:     CHF  History:         Patient has no prior history of Echocardiogram examinations.  Sonographer:     Overton Mam RDCS, FASE Referring Phys:  [id number] Eliezer Mccoy PATEL Diagnosing Phys: Lennie Odor MD  Sonographer Comments: Technically difficult study due to poor echo windows, suboptimal parasternal window and suboptimal apical window. Image acquisition challenging due to respiratory motion. IMPRESSIONS  1. Left ventricular ejection fraction, by estimation, is 65 to 70%. The left ventricle has normal function. The left ventricle has no regional wall motion abnormalities. There is mild concentric left ventricular hypertrophy. Indeterminate diastolic filling due to E-A fusion.  2. Right ventricular systolic function is normal. The right ventricular size is normal. There is moderately elevated pulmonary artery systolic pressure. The estimated right ventricular systolic pressure is 47.6 mmHg.  3. The mitral valve is grossly normal. Trivial mitral valve regurgitation. No evidence of mitral stenosis.  4. The aortic valve is grossly normal. Aortic valve regurgitation is not visualized. No aortic stenosis is present.  Physician Discharge Summary  Jean Gomez ZOX:[id number] DOB: 10-09-1956 DOA: 09/29/2023  PCP: Sherron Monday, MD  Admit date: 09/29/2023 Discharge date: 10/03/2023  Admitted From: home  Disposition:  home  Recommendations for Outpatient Follow-up:  Follow up with PCP in 1-2 weeks F/u w/ GI, Dr. Mia Creek, in 6 weeks  Home Health: no  Equipment/Devices:  Discharge Condition: stable  CODE STATUS: full  Diet recommendation: Heart Healthy   Brief/Interim Summary: HPI was taken from Dr. Irena Cords: Jean Gomez is a 67 y.o. female with medical history significant for sarcoidosis omn oxygen - chronic respiratory failure on 3 L, OSA on CPAP, COPD, asthma , depression, hyperlipidemia, seizure, GIB,seizures, cerebral aneurysm, HDL, depression, osteoarthritis , Gastric Ulcer on egd in July 2024 coming with generalized weakness dizziness, falls , abdominal pain- generalized , and diarrhea that is  watery, and clear and sometimes soft and pasty. NO blood or dark color or odor to it. Pt is not having any hematemesis. No fever chills or chest pain palpitation.  In ed pt was in shock on arrival with systolic in the 60's which matches up with her h/o dizziness and weakness and falls with no head trauma. Pt has labs about noon showing anemia. And was MIVF for resuscitation along with one unit of blood. Initial triage vitals show systolic of 65 respirations of 20 and O2 sats of 99% on 3 L.  Code sepsis was called on arrival due to patient being in shock on presentation.  Initial lactic of 2.6, hemoglobin of 7.9 AKI of 1.91 with a EGFR of 28, normal electrolytes, LFTs added on, leukocytosis of 24.  And platelet count of 577 which I suspect is reactive secondary to her anemia.  As far as her blood work extends chart shows patient has been chronically anemic since 2019 with a hemoglobin of 10.8.   Discharge Diagnoses:  Principal Problem:   Acute GI bleeding Active Problems:   Generalized  weakness   Acute gastric ulcer without hemorrhage or perforation  Hypotension: due to hypovolemia. Resolved. Ruled out shock   Acute on chronic anemia: s/p 2 units of pRBCs transfused. Will continue to monitor H&H    GI bleed: secondary to gastric ulcer. S/p EGD which found clean based ulcer seen in the pyloric channel. Continue on PPI BID. F/u w/ GI, Dr. Mia Creek, in 6 weeks for repeat EGD. No NSAIDs. Hx of gastric ulcer    N/V/D and abdominal pain: w/ symptoms going on for 3-4 weeks. C diff and GI PCR neg   AKI:  likely secondary to ATN. Resolved   HTN: continue on amlodipine, olmesartan, HCTZ  General weakness: PT does not recommend any f/u   Hx COPD & sarcoidosis: continue w/ supportive care  Chronic hypoxic respiratory failure: continue on supplemental oxygen, uses 3L [place] at chronically   Seizure disorder: continue on home dose of keppra  Sepsis: ruled out  Obesity: BMI 31.1. Would benefit from weight loss    Discharge Instructions  Discharge Instructions     Diet - low sodium heart healthy   Complete by: As directed    Discharge instructions   Complete by: As directed    F/u w/ PCP in 1-2 weeks. F/u w/ GI, Dr. Mia Creek, in 6 weeks. Do not take NSAIDs/Non steroidal anti-inflammatory drugs anymore such as ibuprofen, advil, naproxen, naproxen, indomethacin, etc.   Increase activity slowly   Complete by: As directed       Allergies as of 10/03/2023  Physician Discharge Summary  Jean Gomez ZOX:[id number] DOB: 10-09-1956 DOA: 09/29/2023  PCP: Sherron Monday, MD  Admit date: 09/29/2023 Discharge date: 10/03/2023  Admitted From: home  Disposition:  home  Recommendations for Outpatient Follow-up:  Follow up with PCP in 1-2 weeks F/u w/ GI, Dr. Mia Creek, in 6 weeks  Home Health: no  Equipment/Devices:  Discharge Condition: stable  CODE STATUS: full  Diet recommendation: Heart Healthy   Brief/Interim Summary: HPI was taken from Dr. Irena Cords: Jean Gomez is a 67 y.o. female with medical history significant for sarcoidosis omn oxygen - chronic respiratory failure on 3 L, OSA on CPAP, COPD, asthma , depression, hyperlipidemia, seizure, GIB,seizures, cerebral aneurysm, HDL, depression, osteoarthritis , Gastric Ulcer on egd in July 2024 coming with generalized weakness dizziness, falls , abdominal pain- generalized , and diarrhea that is  watery, and clear and sometimes soft and pasty. NO blood or dark color or odor to it. Pt is not having any hematemesis. No fever chills or chest pain palpitation.  In ed pt was in shock on arrival with systolic in the 60's which matches up with her h/o dizziness and weakness and falls with no head trauma. Pt has labs about noon showing anemia. And was MIVF for resuscitation along with one unit of blood. Initial triage vitals show systolic of 65 respirations of 20 and O2 sats of 99% on 3 L.  Code sepsis was called on arrival due to patient being in shock on presentation.  Initial lactic of 2.6, hemoglobin of 7.9 AKI of 1.91 with a EGFR of 28, normal electrolytes, LFTs added on, leukocytosis of 24.  And platelet count of 577 which I suspect is reactive secondary to her anemia.  As far as her blood work extends chart shows patient has been chronically anemic since 2019 with a hemoglobin of 10.8.   Discharge Diagnoses:  Principal Problem:   Acute GI bleeding Active Problems:   Generalized  weakness   Acute gastric ulcer without hemorrhage or perforation  Hypotension: due to hypovolemia. Resolved. Ruled out shock   Acute on chronic anemia: s/p 2 units of pRBCs transfused. Will continue to monitor H&H    GI bleed: secondary to gastric ulcer. S/p EGD which found clean based ulcer seen in the pyloric channel. Continue on PPI BID. F/u w/ GI, Dr. Mia Creek, in 6 weeks for repeat EGD. No NSAIDs. Hx of gastric ulcer    N/V/D and abdominal pain: w/ symptoms going on for 3-4 weeks. C diff and GI PCR neg   AKI:  likely secondary to ATN. Resolved   HTN: continue on amlodipine, olmesartan, HCTZ  General weakness: PT does not recommend any f/u   Hx COPD & sarcoidosis: continue w/ supportive care  Chronic hypoxic respiratory failure: continue on supplemental oxygen, uses 3L [place] at chronically   Seizure disorder: continue on home dose of keppra  Sepsis: ruled out  Obesity: BMI 31.1. Would benefit from weight loss    Discharge Instructions  Discharge Instructions     Diet - low sodium heart healthy   Complete by: As directed    Discharge instructions   Complete by: As directed    F/u w/ PCP in 1-2 weeks. F/u w/ GI, Dr. Mia Creek, in 6 weeks. Do not take NSAIDs/Non steroidal anti-inflammatory drugs anymore such as ibuprofen, advil, naproxen, naproxen, indomethacin, etc.   Increase activity slowly   Complete by: As directed       Allergies as of 10/03/2023  Physician Discharge Summary  Jean Gomez ZOX:[id number] DOB: 10-09-1956 DOA: 09/29/2023  PCP: Sherron Monday, MD  Admit date: 09/29/2023 Discharge date: 10/03/2023  Admitted From: home  Disposition:  home  Recommendations for Outpatient Follow-up:  Follow up with PCP in 1-2 weeks F/u w/ GI, Dr. Mia Creek, in 6 weeks  Home Health: no  Equipment/Devices:  Discharge Condition: stable  CODE STATUS: full  Diet recommendation: Heart Healthy   Brief/Interim Summary: HPI was taken from Dr. Irena Cords: Jean Gomez is a 67 y.o. female with medical history significant for sarcoidosis omn oxygen - chronic respiratory failure on 3 L, OSA on CPAP, COPD, asthma , depression, hyperlipidemia, seizure, GIB,seizures, cerebral aneurysm, HDL, depression, osteoarthritis , Gastric Ulcer on egd in July 2024 coming with generalized weakness dizziness, falls , abdominal pain- generalized , and diarrhea that is  watery, and clear and sometimes soft and pasty. NO blood or dark color or odor to it. Pt is not having any hematemesis. No fever chills or chest pain palpitation.  In ed pt was in shock on arrival with systolic in the 60's which matches up with her h/o dizziness and weakness and falls with no head trauma. Pt has labs about noon showing anemia. And was MIVF for resuscitation along with one unit of blood. Initial triage vitals show systolic of 65 respirations of 20 and O2 sats of 99% on 3 L.  Code sepsis was called on arrival due to patient being in shock on presentation.  Initial lactic of 2.6, hemoglobin of 7.9 AKI of 1.91 with a EGFR of 28, normal electrolytes, LFTs added on, leukocytosis of 24.  And platelet count of 577 which I suspect is reactive secondary to her anemia.  As far as her blood work extends chart shows patient has been chronically anemic since 2019 with a hemoglobin of 10.8.   Discharge Diagnoses:  Principal Problem:   Acute GI bleeding Active Problems:   Generalized  weakness   Acute gastric ulcer without hemorrhage or perforation  Hypotension: due to hypovolemia. Resolved. Ruled out shock   Acute on chronic anemia: s/p 2 units of pRBCs transfused. Will continue to monitor H&H    GI bleed: secondary to gastric ulcer. S/p EGD which found clean based ulcer seen in the pyloric channel. Continue on PPI BID. F/u w/ GI, Dr. Mia Creek, in 6 weeks for repeat EGD. No NSAIDs. Hx of gastric ulcer    N/V/D and abdominal pain: w/ symptoms going on for 3-4 weeks. C diff and GI PCR neg   AKI:  likely secondary to ATN. Resolved   HTN: continue on amlodipine, olmesartan, HCTZ  General weakness: PT does not recommend any f/u   Hx COPD & sarcoidosis: continue w/ supportive care  Chronic hypoxic respiratory failure: continue on supplemental oxygen, uses 3L [place] at chronically   Seizure disorder: continue on home dose of keppra  Sepsis: ruled out  Obesity: BMI 31.1. Would benefit from weight loss    Discharge Instructions  Discharge Instructions     Diet - low sodium heart healthy   Complete by: As directed    Discharge instructions   Complete by: As directed    F/u w/ PCP in 1-2 weeks. F/u w/ GI, Dr. Mia Creek, in 6 weeks. Do not take NSAIDs/Non steroidal anti-inflammatory drugs anymore such as ibuprofen, advil, naproxen, naproxen, indomethacin, etc.   Increase activity slowly   Complete by: As directed       Allergies as of 10/03/2023  Physician Discharge Summary  Jean Gomez ZOX:[id number] DOB: 10-09-1956 DOA: 09/29/2023  PCP: Sherron Monday, MD  Admit date: 09/29/2023 Discharge date: 10/03/2023  Admitted From: home  Disposition:  home  Recommendations for Outpatient Follow-up:  Follow up with PCP in 1-2 weeks F/u w/ GI, Dr. Mia Creek, in 6 weeks  Home Health: no  Equipment/Devices:  Discharge Condition: stable  CODE STATUS: full  Diet recommendation: Heart Healthy   Brief/Interim Summary: HPI was taken from Dr. Irena Cords: Jean Gomez is a 67 y.o. female with medical history significant for sarcoidosis omn oxygen - chronic respiratory failure on 3 L, OSA on CPAP, COPD, asthma , depression, hyperlipidemia, seizure, GIB,seizures, cerebral aneurysm, HDL, depression, osteoarthritis , Gastric Ulcer on egd in July 2024 coming with generalized weakness dizziness, falls , abdominal pain- generalized , and diarrhea that is  watery, and clear and sometimes soft and pasty. NO blood or dark color or odor to it. Pt is not having any hematemesis. No fever chills or chest pain palpitation.  In ed pt was in shock on arrival with systolic in the 60's which matches up with her h/o dizziness and weakness and falls with no head trauma. Pt has labs about noon showing anemia. And was MIVF for resuscitation along with one unit of blood. Initial triage vitals show systolic of 65 respirations of 20 and O2 sats of 99% on 3 L.  Code sepsis was called on arrival due to patient being in shock on presentation.  Initial lactic of 2.6, hemoglobin of 7.9 AKI of 1.91 with a EGFR of 28, normal electrolytes, LFTs added on, leukocytosis of 24.  And platelet count of 577 which I suspect is reactive secondary to her anemia.  As far as her blood work extends chart shows patient has been chronically anemic since 2019 with a hemoglobin of 10.8.   Discharge Diagnoses:  Principal Problem:   Acute GI bleeding Active Problems:   Generalized  weakness   Acute gastric ulcer without hemorrhage or perforation  Hypotension: due to hypovolemia. Resolved. Ruled out shock   Acute on chronic anemia: s/p 2 units of pRBCs transfused. Will continue to monitor H&H    GI bleed: secondary to gastric ulcer. S/p EGD which found clean based ulcer seen in the pyloric channel. Continue on PPI BID. F/u w/ GI, Dr. Mia Creek, in 6 weeks for repeat EGD. No NSAIDs. Hx of gastric ulcer    N/V/D and abdominal pain: w/ symptoms going on for 3-4 weeks. C diff and GI PCR neg   AKI:  likely secondary to ATN. Resolved   HTN: continue on amlodipine, olmesartan, HCTZ  General weakness: PT does not recommend any f/u   Hx COPD & sarcoidosis: continue w/ supportive care  Chronic hypoxic respiratory failure: continue on supplemental oxygen, uses 3L [place] at chronically   Seizure disorder: continue on home dose of keppra  Sepsis: ruled out  Obesity: BMI 31.1. Would benefit from weight loss    Discharge Instructions  Discharge Instructions     Diet - low sodium heart healthy   Complete by: As directed    Discharge instructions   Complete by: As directed    F/u w/ PCP in 1-2 weeks. F/u w/ GI, Dr. Mia Creek, in 6 weeks. Do not take NSAIDs/Non steroidal anti-inflammatory drugs anymore such as ibuprofen, advil, naproxen, naproxen, indomethacin, etc.   Increase activity slowly   Complete by: As directed       Allergies as of 10/03/2023  Physician Discharge Summary  Jean Gomez ZOX:[id number] DOB: 10-09-1956 DOA: 09/29/2023  PCP: Sherron Monday, MD  Admit date: 09/29/2023 Discharge date: 10/03/2023  Admitted From: home  Disposition:  home  Recommendations for Outpatient Follow-up:  Follow up with PCP in 1-2 weeks F/u w/ GI, Dr. Mia Creek, in 6 weeks  Home Health: no  Equipment/Devices:  Discharge Condition: stable  CODE STATUS: full  Diet recommendation: Heart Healthy   Brief/Interim Summary: HPI was taken from Dr. Irena Cords: Jean Gomez is a 67 y.o. female with medical history significant for sarcoidosis omn oxygen - chronic respiratory failure on 3 L, OSA on CPAP, COPD, asthma , depression, hyperlipidemia, seizure, GIB,seizures, cerebral aneurysm, HDL, depression, osteoarthritis , Gastric Ulcer on egd in July 2024 coming with generalized weakness dizziness, falls , abdominal pain- generalized , and diarrhea that is  watery, and clear and sometimes soft and pasty. NO blood or dark color or odor to it. Pt is not having any hematemesis. No fever chills or chest pain palpitation.  In ed pt was in shock on arrival with systolic in the 60's which matches up with her h/o dizziness and weakness and falls with no head trauma. Pt has labs about noon showing anemia. And was MIVF for resuscitation along with one unit of blood. Initial triage vitals show systolic of 65 respirations of 20 and O2 sats of 99% on 3 L.  Code sepsis was called on arrival due to patient being in shock on presentation.  Initial lactic of 2.6, hemoglobin of 7.9 AKI of 1.91 with a EGFR of 28, normal electrolytes, LFTs added on, leukocytosis of 24.  And platelet count of 577 which I suspect is reactive secondary to her anemia.  As far as her blood work extends chart shows patient has been chronically anemic since 2019 with a hemoglobin of 10.8.   Discharge Diagnoses:  Principal Problem:   Acute GI bleeding Active Problems:   Generalized  weakness   Acute gastric ulcer without hemorrhage or perforation  Hypotension: due to hypovolemia. Resolved. Ruled out shock   Acute on chronic anemia: s/p 2 units of pRBCs transfused. Will continue to monitor H&H    GI bleed: secondary to gastric ulcer. S/p EGD which found clean based ulcer seen in the pyloric channel. Continue on PPI BID. F/u w/ GI, Dr. Mia Creek, in 6 weeks for repeat EGD. No NSAIDs. Hx of gastric ulcer    N/V/D and abdominal pain: w/ symptoms going on for 3-4 weeks. C diff and GI PCR neg   AKI:  likely secondary to ATN. Resolved   HTN: continue on amlodipine, olmesartan, HCTZ  General weakness: PT does not recommend any f/u   Hx COPD & sarcoidosis: continue w/ supportive care  Chronic hypoxic respiratory failure: continue on supplemental oxygen, uses 3L [place] at chronically   Seizure disorder: continue on home dose of keppra  Sepsis: ruled out  Obesity: BMI 31.1. Would benefit from weight loss    Discharge Instructions  Discharge Instructions     Diet - low sodium heart healthy   Complete by: As directed    Discharge instructions   Complete by: As directed    F/u w/ PCP in 1-2 weeks. F/u w/ GI, Dr. Mia Creek, in 6 weeks. Do not take NSAIDs/Non steroidal anti-inflammatory drugs anymore such as ibuprofen, advil, naproxen, naproxen, indomethacin, etc.   Increase activity slowly   Complete by: As directed       Allergies as of 10/03/2023  Physician Discharge Summary  Jean Gomez ZOX:[id number] DOB: 10-09-1956 DOA: 09/29/2023  PCP: Sherron Monday, MD  Admit date: 09/29/2023 Discharge date: 10/03/2023  Admitted From: home  Disposition:  home  Recommendations for Outpatient Follow-up:  Follow up with PCP in 1-2 weeks F/u w/ GI, Dr. Mia Creek, in 6 weeks  Home Health: no  Equipment/Devices:  Discharge Condition: stable  CODE STATUS: full  Diet recommendation: Heart Healthy   Brief/Interim Summary: HPI was taken from Dr. Irena Cords: Jean Gomez is a 67 y.o. female with medical history significant for sarcoidosis omn oxygen - chronic respiratory failure on 3 L, OSA on CPAP, COPD, asthma , depression, hyperlipidemia, seizure, GIB,seizures, cerebral aneurysm, HDL, depression, osteoarthritis , Gastric Ulcer on egd in July 2024 coming with generalized weakness dizziness, falls , abdominal pain- generalized , and diarrhea that is  watery, and clear and sometimes soft and pasty. NO blood or dark color or odor to it. Pt is not having any hematemesis. No fever chills or chest pain palpitation.  In ed pt was in shock on arrival with systolic in the 60's which matches up with her h/o dizziness and weakness and falls with no head trauma. Pt has labs about noon showing anemia. And was MIVF for resuscitation along with one unit of blood. Initial triage vitals show systolic of 65 respirations of 20 and O2 sats of 99% on 3 L.  Code sepsis was called on arrival due to patient being in shock on presentation.  Initial lactic of 2.6, hemoglobin of 7.9 AKI of 1.91 with a EGFR of 28, normal electrolytes, LFTs added on, leukocytosis of 24.  And platelet count of 577 which I suspect is reactive secondary to her anemia.  As far as her blood work extends chart shows patient has been chronically anemic since 2019 with a hemoglobin of 10.8.   Discharge Diagnoses:  Principal Problem:   Acute GI bleeding Active Problems:   Generalized  weakness   Acute gastric ulcer without hemorrhage or perforation  Hypotension: due to hypovolemia. Resolved. Ruled out shock   Acute on chronic anemia: s/p 2 units of pRBCs transfused. Will continue to monitor H&H    GI bleed: secondary to gastric ulcer. S/p EGD which found clean based ulcer seen in the pyloric channel. Continue on PPI BID. F/u w/ GI, Dr. Mia Creek, in 6 weeks for repeat EGD. No NSAIDs. Hx of gastric ulcer    N/V/D and abdominal pain: w/ symptoms going on for 3-4 weeks. C diff and GI PCR neg   AKI:  likely secondary to ATN. Resolved   HTN: continue on amlodipine, olmesartan, HCTZ  General weakness: PT does not recommend any f/u   Hx COPD & sarcoidosis: continue w/ supportive care  Chronic hypoxic respiratory failure: continue on supplemental oxygen, uses 3L [place] at chronically   Seizure disorder: continue on home dose of keppra  Sepsis: ruled out  Obesity: BMI 31.1. Would benefit from weight loss    Discharge Instructions  Discharge Instructions     Diet - low sodium heart healthy   Complete by: As directed    Discharge instructions   Complete by: As directed    F/u w/ PCP in 1-2 weeks. F/u w/ GI, Dr. Mia Creek, in 6 weeks. Do not take NSAIDs/Non steroidal anti-inflammatory drugs anymore such as ibuprofen, advil, naproxen, naproxen, indomethacin, etc.   Increase activity slowly   Complete by: As directed       Allergies as of 10/03/2023  1 tablet (25 mg total) by mouth every 6 (six) hours as needed for nausea or vomiting.   sucralfate 1 g tablet Commonly known as: CARAFATE TAKE ONE TABLET BY MOUTH FOUR TIMES DAILY BEFORE MEALS AND AT BEDTIME   tiZANidine 4 MG tablet Commonly known as: ZANAFLEX Take 1 tablet (4 mg total) by mouth 3 (three) times daily. What changed:  when to take this reasons to take this        Follow-up Information     Locklear, Rossie Muskrat, MD Follow up in 6 week(s).   Specialty: Gastroenterology Contact information: [address] [place] [address] [phone]         Sherron Monday, MD Follow up.   Specialty: Internal Medicine Why: F/u in 1-2 weeks Contact information: [address] [place] [address] [phone]                Allergies  Allergen Reactions   Penicillins Rash, Hives and Itching   Contrast Media [Iodinated Contrast Media] Swelling   Gabapentin    Labetalol Other (See Comments)    Made hair fall out   Lactose Intolerance (Gi)     Bloating and diarrhea    Sulfabenzamide Nausea Only   Pregabalin Other (See Comments)    The  patient complained of hemifacial numbness with the use of the medication.    Consultations: GI    Procedures/Studies: ECHOCARDIOGRAM COMPLETE BUBBLE STUDY  Result Date: 09/30/2023    ECHOCARDIOGRAM REPORT   Patient Name:   Jean Gomez Date of Exam: 09/30/2023 Medical Rec #:  [id number]        Height:       63.0 in Accession #:    [id number]       Weight:       176.0 lb Date of Birth:  1956/10/02        BSA:          1.831 m Patient Age:    67 years         BP:           128/82 mmHg Patient Gender: F                HR:           104 bpm. Exam Location:  [facility] Procedure: 2D Echo, Saline Contrast Bubble Study and Intracardiac Opacification            Agent Indications:     CHF  History:         Patient has no prior history of Echocardiogram examinations.  Sonographer:     Overton Mam RDCS, FASE Referring Phys:  [id number] Eliezer Mccoy PATEL Diagnosing Phys: Lennie Odor MD  Sonographer Comments: Technically difficult study due to poor echo windows, suboptimal parasternal window and suboptimal apical window. Image acquisition challenging due to respiratory motion. IMPRESSIONS  1. Left ventricular ejection fraction, by estimation, is 65 to 70%. The left ventricle has normal function. The left ventricle has no regional wall motion abnormalities. There is mild concentric left ventricular hypertrophy. Indeterminate diastolic filling due to E-A fusion.  2. Right ventricular systolic function is normal. The right ventricular size is normal. There is moderately elevated pulmonary artery systolic pressure. The estimated right ventricular systolic pressure is 47.6 mmHg.  3. The mitral valve is grossly normal. Trivial mitral valve regurgitation. No evidence of mitral stenosis.  4. The aortic valve is grossly normal. Aortic valve regurgitation is not visualized. No aortic stenosis is present.  Physician Discharge Summary  Jean Gomez ZOX:[id number] DOB: 10-09-1956 DOA: 09/29/2023  PCP: Sherron Monday, MD  Admit date: 09/29/2023 Discharge date: 10/03/2023  Admitted From: home  Disposition:  home  Recommendations for Outpatient Follow-up:  Follow up with PCP in 1-2 weeks F/u w/ GI, Dr. Mia Creek, in 6 weeks  Home Health: no  Equipment/Devices:  Discharge Condition: stable  CODE STATUS: full  Diet recommendation: Heart Healthy   Brief/Interim Summary: HPI was taken from Dr. Irena Cords: Jean Gomez is a 67 y.o. female with medical history significant for sarcoidosis omn oxygen - chronic respiratory failure on 3 L, OSA on CPAP, COPD, asthma , depression, hyperlipidemia, seizure, GIB,seizures, cerebral aneurysm, HDL, depression, osteoarthritis , Gastric Ulcer on egd in July 2024 coming with generalized weakness dizziness, falls , abdominal pain- generalized , and diarrhea that is  watery, and clear and sometimes soft and pasty. NO blood or dark color or odor to it. Pt is not having any hematemesis. No fever chills or chest pain palpitation.  In ed pt was in shock on arrival with systolic in the 60's which matches up with her h/o dizziness and weakness and falls with no head trauma. Pt has labs about noon showing anemia. And was MIVF for resuscitation along with one unit of blood. Initial triage vitals show systolic of 65 respirations of 20 and O2 sats of 99% on 3 L.  Code sepsis was called on arrival due to patient being in shock on presentation.  Initial lactic of 2.6, hemoglobin of 7.9 AKI of 1.91 with a EGFR of 28, normal electrolytes, LFTs added on, leukocytosis of 24.  And platelet count of 577 which I suspect is reactive secondary to her anemia.  As far as her blood work extends chart shows patient has been chronically anemic since 2019 with a hemoglobin of 10.8.   Discharge Diagnoses:  Principal Problem:   Acute GI bleeding Active Problems:   Generalized  weakness   Acute gastric ulcer without hemorrhage or perforation  Hypotension: due to hypovolemia. Resolved. Ruled out shock   Acute on chronic anemia: s/p 2 units of pRBCs transfused. Will continue to monitor H&H    GI bleed: secondary to gastric ulcer. S/p EGD which found clean based ulcer seen in the pyloric channel. Continue on PPI BID. F/u w/ GI, Dr. Mia Creek, in 6 weeks for repeat EGD. No NSAIDs. Hx of gastric ulcer    N/V/D and abdominal pain: w/ symptoms going on for 3-4 weeks. C diff and GI PCR neg   AKI:  likely secondary to ATN. Resolved   HTN: continue on amlodipine, olmesartan, HCTZ  General weakness: PT does not recommend any f/u   Hx COPD & sarcoidosis: continue w/ supportive care  Chronic hypoxic respiratory failure: continue on supplemental oxygen, uses 3L [place] at chronically   Seizure disorder: continue on home dose of keppra  Sepsis: ruled out  Obesity: BMI 31.1. Would benefit from weight loss    Discharge Instructions  Discharge Instructions     Diet - low sodium heart healthy   Complete by: As directed    Discharge instructions   Complete by: As directed    F/u w/ PCP in 1-2 weeks. F/u w/ GI, Dr. Mia Creek, in 6 weeks. Do not take NSAIDs/Non steroidal anti-inflammatory drugs anymore such as ibuprofen, advil, naproxen, naproxen, indomethacin, etc.   Increase activity slowly   Complete by: As directed       Allergies as of 10/03/2023

## 2023-10-04 LAB — CULTURE, BLOOD (ROUTINE X 2)

## 2023-10-05 ENCOUNTER — Ambulatory Visit: Payer: 59 | Admitting: Internal Medicine

## 2023-10-05 LAB — CULTURE, BLOOD (ROUTINE X 2): Culture: NO GROWTH

## 2023-10-05 NOTE — Progress Notes (Signed)
Care Coordination   Note   10/05/2023 Name: Jean Gomez MRN: [id number] DOB: September 11, 1956  Jean Gomez is a 67 y.o. year old female who sees Sherron Monday, MD for primary care. I reached out to [employer] by phone today to offer care coordination services.  [name] was given information about Care Coordination services today including:   The Care Coordination services include support from the care team which includes your Nurse Coordinator, Clinical Social Worker, or Pharmacist.  The Care Coordination team is here to help remove barriers to the health concerns and goals most important to you. Care Coordination services are voluntary, and the patient may decline or stop services at any time by request to their care team member.   Care Coordination Consent Status: Patient agreed to services and verbal consent obtained.   Follow up plan:  Telephone appointment with care coordination team member scheduled for:  10/10/2023  Encounter Outcome:  Patient Scheduled from referral   Burman Nieves, [facility] Care Coordination Care Guide Direct Dial: [phone]

## 2023-10-06 ENCOUNTER — Other Ambulatory Visit: Payer: Self-pay | Admitting: Internal Medicine

## 2023-10-10 ENCOUNTER — Ambulatory Visit: Payer: Self-pay

## 2023-10-10 NOTE — Patient Outreach (Signed)
Care Coordination   10/10/2023 Name: Jean Gomez MRN: [id number] DOB: 1956/03/04   Care Coordination Outreach Attempts:  Successful contact made with patient. Patient request call back at another time.   Follow Up Plan:  Additional outreach attempts will be made to offer the patient care coordination information and services.   Encounter Outcome:  Patient Request to Call Back   Care Coordination Interventions:  No, not indicated    George Ina [facility] Care Coordination [phone] direct line

## 2023-10-11 ENCOUNTER — Ambulatory Visit: Payer: Self-pay

## 2023-10-11 NOTE — Patient Instructions (Addendum)
Visit Information  Thank you for taking time to visit with me today. Please don't hesitate to contact me if I can be of assistance to you.   Following are the goals we discussed today:   Goals Addressed             This Visit's Progress    Continued improvement post hospitalization and ongoing management of health conditions.       Interventions Today    Flowsheet Row Most Recent Value  Chronic Disease   Chronic disease during today's visit Other  [Acute GI bleed,  gastric ulcer]  General Interventions   General Interventions Discussed/Reviewed Doctor Visits, General Interventions Discussed  [evaluation of treatment plan for mentioned health conditions and patients adherence to plan as established by provider.  Assessed for signs of bleeding/ abdominal pain]  Doctor Visits Discussed/Reviewed Doctor Visits Discussed  Education Interventions   Education Provided Provided Education, Provided Printed Education  [Reviewed signs of bleeding. Advised to notify provider for bleeding symptoms and / or increase in abdominal pain.  Advised to call 911 for severe symptoms. Education article mailed to patient on gastrointestinal bleeding.]  Provided Verbal Education On When to see the doctor  Pharmacy Interventions   Pharmacy Dicussed/Reviewed Pharmacy Topics Discussed              Our next appointment is by telephone on 10/23/23 at 1:30 pm  Please call the care guide team at [phone] if you need to cancel or reschedule your appointment.   If you are experiencing a Mental Health or Behavioral Health Crisis or need someone to talk to, please call the Suicide and Crisis Lifeline: 988 call 1-800-273-TALK (toll free, 24 hour hotline)  The patient verbalized understanding of instructions, educational materials, and care plan provided today and agreed to receive a mailed copy of patient instructions, educational materials, and care plan.   George Ina RN,BSN,CCM [facility] Care  Coordination [phone] direct line Gastrointestinal Bleeding Gastrointestinal (GI) bleeding is bleeding anywhere along the digestive tract. The digestive tract carries food from your mouth until it leaves your body as waste, or poop. You may have blood in your poop or have black poop. If you vomit, there may be blood in it too. This condition can be mild, serious, or even life-threatening. If you have a lot of bleeding, you may need to stay in the hospital. What are the causes? This condition may be caused by: Irritation and swelling of the esophagus (esophagitis). The esophagus is part of the body that moves food from your mouth to your stomach. Swollen veins in the butt (hemorrhoids). Tears in the opening of the butt. These are often caused by passing hard poop. Pouches that form on the colon (diverticulosis). Irritation and swelling of pouches that have formed in your colon (diverticulitis). Growths (polyps) or cancer. Irritation of the stomach lining (gastritis). Sores (ulcers) in the stomach. What increases the risk? You are more likely to develop this condition if: You have a certain type of infection in your stomach called Helicobacter pylori infection. You take certain medicines. You smoke. You drink alcohol. What are the signs or symptoms? Common symptoms of this condition include: Vomiting material that has bright red blood in it. It may look like coffee grounds. Changes in your poop. The poop: May have red blood in it. May be black, look like tar, and smell stronger than normal. May be red. Pain or cramping in the belly (abdomen). How is this treated? Treatment for this condition depends on  the cause of the bleeding. For example: Your doctor may treat the bleeding during diagnosis. Common ways of diagnosing this condition is endoscopy or colonoscopy. Medicines can be used to: Help control irritation, swelling, or infection. Reduce acid in your stomach. Certain  problems can be treated with: Creams. Medicines that are put in the butt (suppositories). Warm baths. Surgery is sometimes needed. If you lose a lot of blood, you may need a blood transfusion. If there is a lot of bleeding, you will need to stay in the hospital. If bleeding is mild, you may be allowed to go home. Follow these instructions at home:  Take over-the-counter and prescription medicines only as told by your doctor. Eat foods that have a lot of fiber in them. These foods include beans, whole grains, and fresh fruits and vegetables. You can also try eating 1-3 prunes each day. Drink enough fluid to keep your pee (urine) pale yellow. Keep all follow-up visits. Contact a doctor if: Your symptoms do not get better with treatment. Get help right away if: Your bleeding does not stop. You feel dizzy or you pass out (faint). You feel weak. You have very bad cramps in your back or belly. You pass large clumps of blood (clots) in your poop. Your symptoms are getting worse. You have chest pain or fast heartbeats. These symptoms may be an emergency. Get help right away. Call 911. Do not wait to see if the symptoms will go away. Do not drive yourself to the hospital. Summary Gastrointestinal (GI) bleeding is bleeding anywhere along the digestive tract. The digestive tract carries food from your mouth until it leaves your body as waste, or poop. This bleeding can be caused by many things. Treatment depends on the cause of the bleeding. Take medicines only as told by your doctor. Keep all follow-up visits. This information is not intended to replace advice given to you by your health care provider. Make sure you discuss any questions you have with your health care provider. Document Revised: 07/08/2021 Document Reviewed: 07/08/2021 Elsevier Patient Education  2024 [employer].

## 2023-10-11 NOTE — Patient Outreach (Signed)
Care Coordination   Initial Visit Note   10/11/2023 Name: Jean Gomez MRN: [id number] DOB: Aug 06, 1956  Jean Gomez is a 67 y.o. year old female who sees Jean Monday, MD for primary care. I spoke with  Jean Gomez by phone today.  What matters to the patients health and wellness today?  Per chart review patient admitted to hospital from 09/29/23 to 10/03/23 for acute GI bleed secondary to gastric ulcer.  Patient states she is doing a little better.  She  denies any bleeding symptoms.  She reports ongoing abdominal discomfort/ pain.   Patient states she has all prescribed medications and is taking them as prescribed. Per chart review patient has post hospital follow up with primary care provider on 10/16/23.  Patient states she is independent with her care.   She states her daughters take her to her appointments.    Goals Addressed             This Visit's Progress    Continued improvement post hospitalization and ongoing management of health conditions.       Interventions Today    Flowsheet Row Most Recent Value  Chronic Disease   Chronic disease during today's visit Other  [Acute GI bleed,  gastric ulcer]  General Interventions   General Interventions Discussed/Reviewed Doctor Visits, General Interventions Discussed  [evaluation of treatment plan for mentioned health conditions and patients adherence to plan as established by provider.  Assessed for signs of bleeding/ abdominal pain]  Doctor Visits Discussed/Reviewed Doctor Visits Discussed  Education Interventions   Education Provided Provided Education, Provided Printed Education  [Reviewed signs of bleeding. Advised to notify provider for bleeding symptoms and / or increase in abdominal pain.  Advised to call 911 for severe symptoms. Education article mailed to patient on gastrointestinal bleeding.]  Provided Verbal Education On When to see the doctor  Pharmacy Interventions   Pharmacy Dicussed/Reviewed  Pharmacy Topics Discussed              SDOH assessments and interventions completed:  Yes  SDOH Interventions Today    Flowsheet Row Most Recent Value  SDOH Interventions   Food Insecurity Interventions Intervention Not Indicated  Housing Interventions Intervention Not Indicated  Transportation Interventions Intervention Not Indicated        Care Coordination Interventions:  Yes, provided   Follow up plan: Follow up call scheduled for 10/23/23    Encounter Outcome:  Patient Visit Completed   George Ina RN,BSN,CCM [facility] Care Coordination [phone] direct line

## 2023-10-12 ENCOUNTER — Ambulatory Visit: Admission: RE | Admit: 2023-10-12 | Payer: 59 | Source: Home / Self Care

## 2023-10-12 SURGERY — ESOPHAGOGASTRODUODENOSCOPY (EGD) WITH PROPOFOL
Anesthesia: General

## 2023-10-15 DIAGNOSIS — M25561 Pain in right knee: Secondary | ICD-10-CM | POA: Diagnosis not present

## 2023-10-15 DIAGNOSIS — M7062 Trochanteric bursitis, left hip: Secondary | ICD-10-CM | POA: Diagnosis not present

## 2023-10-15 DIAGNOSIS — J9611 Chronic respiratory failure with hypoxia: Secondary | ICD-10-CM | POA: Diagnosis not present

## 2023-10-15 DIAGNOSIS — M25562 Pain in left knee: Secondary | ICD-10-CM | POA: Diagnosis not present

## 2023-10-15 DIAGNOSIS — G8929 Other chronic pain: Secondary | ICD-10-CM | POA: Diagnosis not present

## 2023-10-15 DIAGNOSIS — M545 Low back pain, unspecified: Secondary | ICD-10-CM | POA: Diagnosis not present

## 2023-10-15 DIAGNOSIS — J449 Chronic obstructive pulmonary disease, unspecified: Secondary | ICD-10-CM | POA: Diagnosis not present

## 2023-10-15 DIAGNOSIS — M25511 Pain in right shoulder: Secondary | ICD-10-CM | POA: Diagnosis not present

## 2023-10-16 ENCOUNTER — Telehealth: Payer: Self-pay

## 2023-10-16 ENCOUNTER — Telehealth: Payer: Self-pay | Admitting: Internal Medicine

## 2023-10-16 ENCOUNTER — Ambulatory Visit (INDEPENDENT_AMBULATORY_CARE_PROVIDER_SITE_OTHER): Payer: 59 | Admitting: Internal Medicine

## 2023-10-16 ENCOUNTER — Encounter: Payer: Self-pay | Admitting: Internal Medicine

## 2023-10-16 ENCOUNTER — Other Ambulatory Visit: Payer: Self-pay | Admitting: Internal Medicine

## 2023-10-16 VITALS — BP 108/80 | HR 110 | Ht 63.0 in | Wt 167.8 lb

## 2023-10-16 DIAGNOSIS — E860 Dehydration: Secondary | ICD-10-CM | POA: Diagnosis not present

## 2023-10-16 DIAGNOSIS — D638 Anemia in other chronic diseases classified elsewhere: Secondary | ICD-10-CM

## 2023-10-16 DIAGNOSIS — D62 Acute posthemorrhagic anemia: Secondary | ICD-10-CM | POA: Diagnosis not present

## 2023-10-16 DIAGNOSIS — K259 Gastric ulcer, unspecified as acute or chronic, without hemorrhage or perforation: Secondary | ICD-10-CM

## 2023-10-16 DIAGNOSIS — B3731 Acute candidiasis of vulva and vagina: Secondary | ICD-10-CM

## 2023-10-16 DIAGNOSIS — R7303 Prediabetes: Secondary | ICD-10-CM

## 2023-10-16 DIAGNOSIS — Z0130 Encounter for examination of blood pressure without abnormal findings: Secondary | ICD-10-CM

## 2023-10-16 MED ORDER — FLUCONAZOLE 150 MG PO TABS: 150.0000 mg | ORAL_TABLET | Freq: Once | ORAL | 0 refills | Status: AC

## 2023-10-16 NOTE — Telephone Encounter (Signed)
Patient left VM that she seen TJ this morning and he did not call in her "yeast cream". I do not see any creams on her med list. Maybe you know what she is referring to. Please advise.

## 2023-10-16 NOTE — Telephone Encounter (Signed)
Patient is requesting to get the yeast infection medication called into the pharmacy for her

## 2023-10-16 NOTE — Progress Notes (Signed)
Established Patient Office Visit  Subjective:  Patient ID: Jean Gomez, female    DOB: [birth year]  Age: 67 y.o. MRN: [id number]  Chief Complaint  Patient presents with   Hospitalization Follow-up    Hospital follow up    Here for hospital f/u for blood loss anemia due to bleeding pud and hypotension with dehydration.  Still c/o poor appetite and only drinks around 1000 ml/day.  No other concerns at this time.   Past Medical History:  Diagnosis Date   Acute postoperative pain 01/16/2019   Allergy    Asthma    Cerebral aneurysm    Chest pain 11/01/2016   Chronic migraine    Migraine   Chronic pain syndrome    Chronic respiratory failure (HCC)    Constipation    COPD (chronic obstructive pulmonary disease) (HCC)    Depression    major depression in remission   Hypercholesterolemia    Hypertension    Osteoarthritis    Pneumonia    12/2018   Sarcoid    Seizures (HCC)    Sleep apnea     Past Surgical History:  Procedure Laterality Date   ABDOMINAL HYSTERECTOMY     BACK SURGERY     spine fusion anterior lumbar with open posterior   BIOPSY  07/02/2023   Procedure: BIOPSY;  Surgeon: Regis Bill, MD;  Location: [facility] ENDOSCOPY;  Service: Endoscopy;;   CHOLECYSTECTOMY     COLONOSCOPY WITH PROPOFOL N/A 08/19/2019   Procedure: COLONOSCOPY WITH PROPOFOL;  Surgeon: Christena Deem, MD;  Location: [facility] ENDOSCOPY;  Service: Endoscopy;  Laterality: N/A;   ESOPHAGOGASTRODUODENOSCOPY (EGD) WITH PROPOFOL N/A 08/19/2019   Procedure: ESOPHAGOGASTRODUODENOSCOPY (EGD) WITH PROPOFOL;  Surgeon: Christena Deem, MD;  Location: [facility] ENDOSCOPY;  Service: Endoscopy;  Laterality: N/A;   ESOPHAGOGASTRODUODENOSCOPY (EGD) WITH PROPOFOL N/A 08/04/2020   Procedure: ESOPHAGOGASTRODUODENOSCOPY (EGD) WITH PROPOFOL;  Surgeon: Earline Mayotte, MD;  Location: [facility] ENDOSCOPY;  Service: Endoscopy;  Laterality: N/A;   ESOPHAGOGASTRODUODENOSCOPY (EGD) WITH PROPOFOL N/A 11/05/2020   Procedure:  ESOPHAGOGASTRODUODENOSCOPY (EGD) WITH PROPOFOL;  Surgeon: Earline Mayotte, MD;  Location: [facility] ENDOSCOPY;  Service: Endoscopy;  Laterality: N/A;   ESOPHAGOGASTRODUODENOSCOPY (EGD) WITH PROPOFOL N/A 07/02/2023   Procedure: ESOPHAGOGASTRODUODENOSCOPY (EGD) WITH PROPOFOL;  Surgeon: Regis Bill, MD;  Location: [facility] ENDOSCOPY;  Service: Endoscopy;  Laterality: N/A;   ESOPHAGOGASTRODUODENOSCOPY (EGD) WITH PROPOFOL N/A 10/01/2023   Procedure: ESOPHAGOGASTRODUODENOSCOPY (EGD) WITH PROPOFOL;  Surgeon: Midge Minium, MD;  Location: [facility] ENDOSCOPY;  Service: Endoscopy;  Laterality: N/A;   SPINAL FUSION      Social History   Socioeconomic History   Marital status: Single    Spouse name: Not on file   Number of children: Not on file   Years of education: Not on file   Highest education level: Not on file  Occupational History   Not on file  Tobacco Use   Smoking status: Never   Smokeless tobacco: Never  Vaping Use   Vaping status: Never Used  Substance and Sexual Activity   Alcohol use: Yes    Comment: rarely   Drug use: No   Sexual activity: Not on file  Other Topics Concern   Not on file  Social History Narrative   Not on file   Social Determinants of Health   Financial Resource Strain: Low Risk  (03/05/2018)   Overall Financial Resource Strain (CARDIA)    Difficulty of Paying Living Expenses: Not very hard  Food Insecurity: No Food Insecurity (10/11/2023)   Hunger  Vital Sign    Worried About Programme researcher, broadcasting/film/video in the Last Year: Never true    Ran Out of Food in the Last Year: Never true  Transportation Needs: No Transportation Needs (10/11/2023)   PRAPARE - Administrator, Civil Service (Medical): No    Lack of Transportation (Non-Medical): No  Physical Activity: Unknown (03/05/2018)   Exercise Vital Sign    Days of Exercise per Week: Patient declined    Minutes of Exercise per Session: Patient declined  Stress: No Stress Concern Present (03/05/2018)    [employer] of Occupational Health - Occupational Stress Questionnaire    Feeling of Stress : Only a little  Social Connections: Unknown (03/05/2018)   Social Connection and Isolation Panel [NHANES]    Frequency of Communication with Friends and Family: Patient declined    Frequency of Social Gatherings with Friends and Family: Patient declined    Attends Religious Services: Patient declined    Database administrator or Organizations: Patient declined    Attends Banker Meetings: Patient declined    Marital Status: Patient declined  Intimate Partner Violence: Not At Risk (09/30/2023)   Humiliation, Afraid, Rape, and Kick questionnaire    Fear of Current or Ex-Partner: No    Emotionally Abused: No    Physically Abused: No    Sexually Abused: No    Family History  Problem Relation Age of Onset   Dementia Mother    Breast cancer Mother    Throat cancer Father     Allergies  Allergen Reactions   Penicillins Rash, Hives and Itching   Contrast Media [Iodinated Contrast Media] Swelling   Gabapentin    Labetalol Other (See Comments)    Made hair fall out   Lactose Intolerance (Gi)     Bloating and diarrhea    Sulfabenzamide Nausea Only   Pregabalin Other (See Comments)    The patient complained of hemifacial numbness with the use of the medication.    Review of Systems  Constitutional: Negative.   HENT: Negative.    Eyes: Negative.   Respiratory: Negative.    Cardiovascular: Negative.   Gastrointestinal:  Positive for nausea.  Genitourinary: Negative.   Skin: Negative.   Neurological: Negative.   Endo/Heme/Allergies: Negative.   Psychiatric/Behavioral:  The patient has insomnia.        Objective:   BP 108/80   Pulse (!) 110   Ht 5\' 3"  (1.6 m)   Wt 167 lb 12.8 oz (76.1 kg)   SpO2 98% Comment: 3 L  BMI 29.72 kg/m   Vitals:   10/16/23 1119  BP: 108/80  Pulse: (!) 110  Height: 5\' 3"  (1.6 m)  Weight: 167 lb 12.8 oz (76.1 kg)  SpO2:  98% Comment: 3 L  BMI (Calculated): 29.73    Physical Exam Vitals reviewed.  Constitutional:      General: She is not in acute distress. HENT:     Head: Normocephalic.     Nose: Nose normal.     Mouth/Throat:     Mouth: Mucous membranes are moist.  Eyes:     Extraocular Movements: Extraocular movements intact.     Pupils: Pupils are equal, round, and reactive to light.  Cardiovascular:     Rate and Rhythm: Normal rate and regular rhythm.     Heart sounds: No murmur heard. Pulmonary:     Effort: Pulmonary effort is normal.     Breath sounds: No rhonchi or rales.  Abdominal:  General: Abdomen is flat.     Palpations: There is no hepatomegaly, splenomegaly or mass.  Musculoskeletal:        General: Normal range of motion.     Cervical back: Normal range of motion. No tenderness.     Right lower leg: 1+ Pitting Edema present.     Left lower leg: 1+ Pitting Edema present.  Skin:    General: Skin is warm and dry.  Neurological:     General: No focal deficit present.     Mental Status: She is alert and oriented to person, place, and time.     Cranial Nerves: No cranial nerve deficit.     Motor: No weakness.  Psychiatric:        Mood and Affect: Mood normal.        Behavior: Behavior normal.      No results found for any visits on 10/16/23.  Recent Results (from the past 2160 hour(Ramello Cordial))  Culture, blood (Routine x 2)     Status: None   Collection Time: 08/30/23 11:11 AM   Specimen: BLOOD  Result Value Ref Range   Specimen Description BLOOD BLOOD LEFT HAND    Special Requests      BOTTLES DRAWN AEROBIC AND ANAEROBIC Blood Culture results may not be optimal due to an inadequate volume of blood received in culture bottles   Culture      NO GROWTH 5 DAYS Performed at [facility], [address] [place], [place] [address]    Report Status 09/04/2023 FINAL   Urinalysis, w/ Reflex to Culture (Infection Suspected) -Urine, Clean Catch     Status: Abnormal    Collection Time: 08/30/23 11:11 AM  Result Value Ref Range   Specimen Source URINE, CATHETERIZED    Color, Urine YELLOW (A) YELLOW   APPearance HAZY (A) CLEAR   Specific Gravity, Urine 1.023 1.005 - 1.030   pH 5.0 5.0 - 8.0   Glucose, UA NEGATIVE NEGATIVE mg/dL   Hgb urine dipstick NEGATIVE NEGATIVE   Bilirubin Urine NEGATIVE NEGATIVE   Ketones, ur NEGATIVE NEGATIVE mg/dL   Protein, ur NEGATIVE NEGATIVE mg/dL   Nitrite NEGATIVE NEGATIVE   Leukocytes,Ua TRACE (A) NEGATIVE   RBC / HPF 0-5 0 - 5 RBC/hpf   WBC, UA 0-5 0 - 5 WBC/hpf    Comment:        Reflex urine culture not performed if WBC <=10, OR if Squamous epithelial cells >5. If Squamous epithelial cells >5 suggest recollection.    Bacteria, UA RARE (A) NONE SEEN   Squamous Epithelial / HPF 0-5 0 - 5 /HPF   Mucus PRESENT    Hyaline Casts, UA PRESENT     Comment: Performed at [facility], [address] [place], [place] [address]  SARS Coronavirus 2 by RT PCR (hospital order, performed in [facility] hospital lab) *cepheid single result test* Urine, Catheterized     Status: None   Collection Time: 08/30/23 11:11 AM   Specimen: Urine, Catheterized; Nasal Swab  Result Value Ref Range   SARS Coronavirus 2 by RT PCR NEGATIVE NEGATIVE    Comment: (NOTE) SARS-CoV-2 target nucleic acids are NOT DETECTED.  The SARS-CoV-2 RNA is generally detectable in upper and lower respiratory specimens during the acute phase of infection. The lowest concentration of SARS-CoV-2 viral copies this assay can detect is 250 copies / mL. A negative result does not preclude SARS-CoV-2 infection and should not be used as the sole basis for treatment or other patient  management decisions.  A negative result may occur with improper specimen collection / handling, submission of specimen other than nasopharyngeal swab, presence of viral mutation(Lafaye Mcelmurry) within the areas targeted by this assay, and inadequate number of viral copies (<250 copies /  mL). A negative result must be combined with clinical observations, patient history, and epidemiological information.  Fact Sheet for Patients:   RoadLapTop.co.za  Fact Sheet for Healthcare Providers: http://kim-miller.com/  This test is not yet approved or  cleared by the [place] FDA and has been authorized for detection and/or diagnosis of SARS-CoV-2 by FDA under an Emergency Use Authorization (EUA).  This EUA will remain in effect (meaning this test can be used) for the duration of the COVID-19 declaration under Section 564(b)(1) of the Act, 21 U.Nabilah Davoli.C. section 360bbb-3(b)(1), unless the authorization is terminated or revoked sooner.  Performed at [facility], [address] [place], [place] [address]   Sodium, urine, random     Status: None   Collection Time: 08/30/23 11:11 AM  Result Value Ref Range   Sodium, Ur 42 mmol/L    Comment: Performed at [facility], [address] [place], [place] [address]  Creatinine, urine, random     Status: None   Collection Time: 08/30/23 11:11 AM  Result Value Ref Range   Creatinine, Urine 119 mg/dL    Comment: Performed at [facility], [address] [place], [place] [address]  Comprehensive metabolic panel     Status: Abnormal   Collection Time: 08/30/23 11:31 AM  Result Value Ref Range   Sodium 136 135 - 145 mmol/L   Potassium 4.4 3.5 - 5.1 mmol/L   Chloride 107 98 - 111 mmol/L   CO2 17 (L) 22 - 32 mmol/L   Glucose, Bld 106 (H) 70 - 99 mg/dL    Comment: Glucose reference range applies only to samples taken after fasting for at least 8 hours.   BUN 37 (H) 8 - 23 mg/dL   Creatinine, Ser 8.46 (H) 0.44 - 1.00 mg/dL   Calcium 8.8 (L) 8.9 - 10.3 mg/dL   Total Protein 7.0 6.5 - 8.1 g/dL   Albumin 2.7 (L) 3.5 - 5.0 g/dL   AST 19 15 - 41 U/L   ALT 16 0 - 44 U/L   Alkaline Phosphatase 123 38 - 126 U/L   Total Bilirubin 0.3 0.3 - 1.2 mg/dL   GFR, Estimated  37 (L) >60 mL/min    Comment: (NOTE) Calculated using the CKD-EPI Creatinine Equation (2021)    Anion gap 12 5 - 15    Comment: Performed at [facility], [address], [place], [place] [address]  Lactic acid, plasma     Status: None   Collection Time: 08/30/23 11:31 AM  Result Value Ref Range   Lactic Acid, Venous 1.0 0.5 - 1.9 mmol/L    Comment: Performed at [facility], [address] [place], [place] [address]  CBC with Differential     Status: Abnormal   Collection Time: 08/30/23 11:31 AM  Result Value Ref Range   WBC 24.8 (H) 4.0 - 10.5 K/uL   RBC 3.47 (L) 3.87 - 5.11 MIL/uL   Hemoglobin 8.1 (L) 12.0 - 15.0 g/dL    Comment: Reticulocyte Hemoglobin testing may be clinically indicated, consider ordering this additional test [id number]    HCT 26.5 (L) 36.0 - 46.0 %   MCV 76.4 (L) 80.0 - 100.0 fL   MCH 23.3 (L) 26.0 - 34.0 pg   MCHC  30.6 30.0 - 36.0 g/dL   RDW 96.2 (H) 95.2 - 84.1 %   Platelets 560 (H) 150 - 400 K/uL   nRBC 0.1 0.0 - 0.2 %   Neutrophils Relative % 85 %   Neutro Abs 21.1 (H) 1.7 - 7.7 K/uL   Lymphocytes Relative 6 %   Lymphs Abs 1.4 0.7 - 4.0 K/uL   Monocytes Relative 7 %   Monocytes Absolute 1.8 (H) 0.1 - 1.0 K/uL   Eosinophils Relative 0 %   Eosinophils Absolute 0.1 0.0 - 0.5 K/uL   Basophils Relative 0 %   Basophils Absolute 0.1 0.0 - 0.1 K/uL   Immature Granulocytes 2 %   Abs Immature Granulocytes 0.39 (H) 0.00 - 0.07 K/uL    Comment: Performed at [facility], [address], [place], [place] [address]  Protime-INR     Status: None   Collection Time: 08/30/23 11:31 AM  Result Value Ref Range   Prothrombin Time 14.5 11.4 - 15.2 seconds   INR 1.1 0.8 - 1.2    Comment: (NOTE) INR goal varies based on device and disease states. Performed at [facility], [address] [place], [place] [address]   Culture, blood (Routine x 2)     Status: None   Collection Time: 08/30/23 11:31 AM   Specimen: BLOOD   Result Value Ref Range   Specimen Description BLOOD BLOOD LEFT ARM    Special Requests      BOTTLES DRAWN AEROBIC AND ANAEROBIC Blood Culture adequate volume   Culture      NO GROWTH 5 DAYS Performed at [facility], [address], [place], [place] [address]    Report Status 09/04/2023 FINAL   Troponin I (High Sensitivity)     Status: None   Collection Time: 08/30/23 11:31 AM  Result Value Ref Range   Troponin I (High Sensitivity) 17 <18 ng/L    Comment: (NOTE) Elevated high sensitivity troponin I (hsTnI) values and significant  changes across serial measurements may suggest ACS but many other  chronic and acute conditions are known to elevate hsTnI results.  Refer to the "Links" section for chest pain algorithms and additional  guidance. Performed at [facility], [address] [place], [place] [address]   C Difficile Quick Screen w PCR reflex     Status: None   Collection Time: 08/30/23  3:13 PM   Specimen: STOOL  Result Value Ref Range   C Diff antigen NEGATIVE NEGATIVE   C Diff toxin NEGATIVE NEGATIVE   C Diff interpretation No C. difficile detected.     Comment: Performed at [facility], [address] [place], [place] [address]  Gastrointestinal Panel by PCR , Stool     Status: None   Collection Time: 08/30/23  3:13 PM   Specimen: STOOL  Result Value Ref Range   Campylobacter species NOT DETECTED NOT DETECTED   Plesimonas shigelloides NOT DETECTED NOT DETECTED   Salmonella species NOT DETECTED NOT DETECTED   Yersinia enterocolitica NOT DETECTED NOT DETECTED   Vibrio species NOT DETECTED NOT DETECTED   Vibrio cholerae NOT DETECTED NOT DETECTED   Enteroaggregative E coli (EAEC) NOT DETECTED NOT DETECTED   Enteropathogenic E coli (EPEC) NOT DETECTED NOT DETECTED   Enterotoxigenic E coli (ETEC) NOT DETECTED NOT DETECTED   Shiga like toxin producing E coli (STEC) NOT DETECTED NOT DETECTED   Shigella/Enteroinvasive E coli (EIEC) NOT  DETECTED NOT DETECTED   Cryptosporidium NOT DETECTED NOT DETECTED  Cyclospora cayetanensis NOT DETECTED NOT DETECTED   Entamoeba histolytica NOT DETECTED NOT DETECTED   Giardia lamblia NOT DETECTED NOT DETECTED   Adenovirus F40/41 NOT DETECTED NOT DETECTED   Astrovirus NOT DETECTED NOT DETECTED   Norovirus GI/GII NOT DETECTED NOT DETECTED   Rotavirus A NOT DETECTED NOT DETECTED   Sapovirus (I, II, IV, and V) NOT DETECTED NOT DETECTED    Comment: Performed at [facility], [address] [place], [place] [address]  Lactic acid, plasma     Status: None   Collection Time: 08/30/23  4:24 PM  Result Value Ref Range   Lactic Acid, Venous 1.1 0.5 - 1.9 mmol/L    Comment: Performed at [facility], [address], [place], [place] [address]  Folate     Status: None   Collection Time: 08/30/23  7:25 PM  Result Value Ref Range   Folate 20.5 >5.9 ng/mL    Comment: Performed at [facility], [address] [place], [place] [address]  Iron and TIBC     Status: Abnormal   Collection Time: 08/30/23  7:25 PM  Result Value Ref Range   Iron 10 (L) 28 - 170 ug/dL   TIBC 528 413 - 244 ug/dL   Saturation Ratios 4 (L) 10.4 - 31.8 %   UIBC 278 ug/dL    Comment: Performed at [facility], [address] [place], [place] [address]  Ferritin     Status: None   Collection Time: 08/30/23  7:25 PM  Result Value Ref Range   Ferritin 54 11 - 307 ng/mL    Comment: Performed at [facility] [address] [place], [place] [address]  Vitamin B12     Status: None   Collection Time: 08/30/23  7:30 PM  Result Value Ref Range   Vitamin B-12 881 180 - 914 pg/mL    Comment: (NOTE) This assay is not validated for testing neonatal or myeloproliferative syndrome specimens for Vitamin B12 levels. Performed at [facility], [address], [place], [place] [address]   Reticulocytes     Status: Abnormal   Collection Time: 08/30/23  7:30 PM  Result Value  Ref Range   Retic Ct Pct 0.6 0.4 - 3.1 %   RBC. 3.37 (L) 3.87 - 5.11 MIL/uL   Retic Count, Absolute 20.6 19.0 - 186.0 K/uL   Immature Retic Fract 29.7 (H) 2.3 - 15.9 %    Comment: Performed at [facility], [address] [place], [place] [address]  HIV Antibody (routine testing w rflx)     Status: None   Collection Time: 08/31/23  5:13 AM  Result Value Ref Range   HIV Screen 4th Generation wRfx Non Reactive Non Reactive    Comment: Performed at [facility], [address] [place], [place] [address]  Basic metabolic panel     Status: Abnormal   Collection Time: 08/31/23 11:23 AM  Result Value Ref Range   Sodium 140 135 - 145 mmol/L   Potassium 3.7 3.5 - 5.1 mmol/L   Chloride 109 98 - 111 mmol/L   CO2 19 (L) 22 - 32 mmol/L   Glucose, Bld 111 (H) 70 - 99 mg/dL    Comment: Glucose reference range applies only to samples taken after fasting for at least 8 hours.   BUN 15 8 - 23 mg/dL   Creatinine, Ser 5.64 0.44 - 1.00 mg/dL   Calcium 8.5 (L) 8.9 - 10.3 mg/dL   GFR,  Estimated >60 >60 mL/min    Comment: (NOTE) Calculated using the CKD-EPI Creatinine Equation (2021)    Anion gap 12 5 - 15    Comment: Performed at [facility], [address] [place], [place] [address]  CBC with Differential/Platelet     Status: Abnormal   Collection Time: 08/31/23 11:23 AM  Result Value Ref Range   WBC 25.9 (H) 4.0 - 10.5 K/uL   RBC 3.44 (L) 3.87 - 5.11 MIL/uL   Hemoglobin 7.9 (L) 12.0 - 15.0 g/dL    Comment: Reticulocyte Hemoglobin testing may be clinically indicated, consider ordering this additional test [id number]    HCT 26.4 (L) 36.0 - 46.0 %   MCV 76.7 (L) 80.0 - 100.0 fL   MCH 23.0 (L) 26.0 - 34.0 pg   MCHC 29.9 (L) 30.0 - 36.0 g/dL   RDW 35.3 (H) 29.9 - 24.2 %   Platelets 560 (H) 150 - 400 K/uL   nRBC 0.1 0.0 - 0.2 %   Neutrophils Relative % 90 %   Neutro Abs 23.2 (H) 1.7 - 7.7 K/uL   Lymphocytes Relative 4 %   Lymphs Abs 1.1 0.7 - 4.0 K/uL   Monocytes  Relative 4 %   Monocytes Absolute 1.0 0.1 - 1.0 K/uL   Eosinophils Relative 0 %   Eosinophils Absolute 0.0 0.0 - 0.5 K/uL   Basophils Relative 0 %   Basophils Absolute 0.1 0.0 - 0.1 K/uL   WBC Morphology MORPHOLOGY UNREMARKABLE    RBC Morphology MORPHOLOGY UNREMARKABLE    Smear Review Normal platelet morphology    Immature Granulocytes 2 %   Abs Immature Granulocytes 0.59 (H) 0.00 - 0.07 K/uL    Comment: Performed at [facility], [address] [place], [place] [address]  CBC with Differential/Platelet     Status: Abnormal   Collection Time: 09/01/23  5:17 AM  Result Value Ref Range   WBC 22.6 (H) 4.0 - 10.5 K/uL   RBC 2.96 (L) 3.87 - 5.11 MIL/uL   Hemoglobin 7.0 (L) 12.0 - 15.0 g/dL    Comment: Reticulocyte Hemoglobin testing may be clinically indicated, consider ordering this additional test [id number]    HCT 22.1 (L) 36.0 - 46.0 %   MCV 74.7 (L) 80.0 - 100.0 fL   MCH 23.6 (L) 26.0 - 34.0 pg   MCHC 31.7 30.0 - 36.0 g/dL   RDW 89.2 (H) 11.9 - 41.7 %   Platelets 497 (H) 150 - 400 K/uL   nRBC 0.1 0.0 - 0.2 %   Neutrophils Relative % 86 %   Neutro Abs 19.5 (H) 1.7 - 7.7 K/uL   Lymphocytes Relative 7 %   Lymphs Abs 1.5 0.7 - 4.0 K/uL   Monocytes Relative 4 %   Monocytes Absolute 0.8 0.1 - 1.0 K/uL   Eosinophils Relative 0 %   Eosinophils Absolute 0.0 0.0 - 0.5 K/uL   Basophils Relative 0 %   Basophils Absolute 0.0 0.0 - 0.1 K/uL   Immature Granulocytes 3 %   Abs Immature Granulocytes 0.74 (H) 0.00 - 0.07 K/uL    Comment: Performed at [facility], [address] [place], [place] [address]  Magnesium     Status: None   Collection Time: 09/01/23  5:17 AM  Result Value Ref Range   Magnesium 1.7 1.7 - 2.4 mg/dL    Comment: Performed at [facility], [address], [place], [place] [address]  Phosphorus     Status: None   Collection Time: 09/01/23  5:17 AM  Result Value Ref Range   Phosphorus 2.6 2.5 - 4.6 mg/dL    Comment: Performed at  [facility], [address] [place], [place] [address]  VITAMIN D 25 Hydroxy (Vit-D Deficiency, Fractures)     Status: None   Collection Time: 09/01/23  5:17 AM  Result Value Ref Range   Vit D, 25-Hydroxy 44.50 30 - 100 ng/mL    Comment: (NOTE) Vitamin D deficiency has been defined by the Institute of Medicine  and an Endocrine Society practice guideline as a level of serum 25-OH  vitamin D less than 20 ng/mL (1,2). The Endocrine Society went on to  further define vitamin D insufficiency as a level between 21 and 29  ng/mL (2).  1. IOM (Institute of Medicine). 2010. Dietary reference intakes for  calcium and D. [place]: [employer]. 2. Holick MF, Binkley [place], Bischoff-Ferrari HA, et al. Evaluation,  treatment, and prevention of vitamin D deficiency: an Endocrine  Society clinical practice guideline, JCEM. 2011 Jul; 96(7): 1911-30.  Performed at [facility], [address] [place], [place] [address]   Vitamin B12     Status: None   Collection Time: 09/01/23  5:17 AM  Result Value Ref Range   Vitamin B-12 897 180 - 914 pg/mL    Comment: (NOTE) This assay is not validated for testing neonatal or myeloproliferative syndrome specimens for Vitamin B12 levels. Performed at [facility], [address], [place], [place] [address]   ABO/Rh     Status: None   Collection Time: 09/01/23  5:17 AM  Result Value Ref Range   ABO/RH(D)      A POS Performed at [facility], [address] [place], [place] [address]   Prepare RBC (crossmatch)     Status: None   Collection Time: 09/01/23 10:51 AM  Result Value Ref Range   Order Confirmation      ORDER PROCESSED BY BLOOD BANK Performed at [facility], [address] [place], [place] [address]   Type and screen [facility]     Status: None   Collection Time: 09/01/23 11:28 AM  Result Value Ref Range   ABO/RH(D) A POS    Antibody Screen NEG     Sample Expiration 09/04/2023,2359    Unit Number [id number]    Blood Component Type RED CELLS,LR    Unit division 00    Status of Unit ISSUED,FINAL    Transfusion Status OK TO TRANSFUSE    Crossmatch Result      Compatible Performed at [facility], [address] [place], [place] [address]   BPAM RBC     Status: None   Collection Time: 09/01/23 11:28 AM  Result Value Ref Range   ISSUE DATE / TIME [id number]    Blood Product Unit Number [id number]    PRODUCT CODE [id number]    Unit Type and Rh 6200    Blood Product Expiration Date [id number]   CBC with Differential/Platelet     Status: Abnormal   Collection Time: 09/02/23 11:17 AM  Result Value Ref Range   WBC 23.4 (H) 4.0 - 10.5 K/uL   RBC 4.22 3.87 - 5.11 MIL/uL   Hemoglobin 10.2 (L) 12.0 - 15.0 g/dL    Comment: POST TRANSFUSION SPECIMEN   HCT 33.3 (L) 36.0 - 46.0 %   MCV 78.9 (L) 80.0 - 100.0 fL   MCH 24.2 (L) 26.0 - 34.0 pg   MCHC  30.6 30.0 - 36.0 g/dL   RDW 78.2 (H) 95.6 - 21.3 %   Platelets 543 (H) 150 - 400 K/uL   nRBC 0.1 0.0 - 0.2 %   Neutrophils Relative % 79 %   Neutro Abs 18.2 (H) 1.7 - 7.7 K/uL   Lymphocytes Relative 12 %   Lymphs Abs 2.9 0.7 - 4.0 K/uL   Monocytes Relative 6 %   Monocytes Absolute 1.4 (H) 0.1 - 1.0 K/uL   Eosinophils Relative 0 %   Eosinophils Absolute 0.0 0.0 - 0.5 K/uL   Basophils Relative 0 %   Basophils Absolute 0.1 0.0 - 0.1 K/uL   Immature Granulocytes 3 %   Abs Immature Granulocytes 0.77 (H) 0.00 - 0.07 K/uL    Comment: Performed at [facility], [address], [place], [place] [address]  Basic metabolic panel     Status: None   Collection Time: 09/02/23 11:17 AM  Result Value Ref Range   Sodium 138 135 - 145 mmol/L   Potassium 3.6 3.5 - 5.1 mmol/L   Chloride 103 98 - 111 mmol/L   CO2 24 22 - 32 mmol/L   Glucose, Bld 96 70 - 99 mg/dL    Comment: Glucose reference range applies only to samples taken after fasting for at least 8 hours.   BUN 9 8 -  23 mg/dL   Creatinine, Ser 8.46 0.44 - 1.00 mg/dL   Calcium 8.9 8.9 - 96.2 mg/dL   GFR, Estimated >95 >28 mL/min    Comment: (NOTE) Calculated using the CKD-EPI Creatinine Equation (2021)    Anion gap 11 5 - 15    Comment: Performed at [facility], [address] [place], [place] [address]  Magnesium     Status: None   Collection Time: 09/02/23 11:17 AM  Result Value Ref Range   Magnesium 1.9 1.7 - 2.4 mg/dL    Comment: Performed at [facility], [address] [place], [place] [address]  Phosphorus     Status: Abnormal   Collection Time: 09/02/23 11:17 AM  Result Value Ref Range   Phosphorus 2.4 (L) 2.5 - 4.6 mg/dL    Comment: Performed at [facility], [address] [place], [place] [address]  CBC with Differential/Platelet     Status: Abnormal   Collection Time: 09/03/23  7:08 AM  Result Value Ref Range   WBC 16.2 (H) 4.0 - 10.5 K/uL   RBC 3.85 (L) 3.87 - 5.11 MIL/uL   Hemoglobin 9.5 (L) 12.0 - 15.0 g/dL   HCT 64.4 (L) 03.4 - 74.2 %   MCV 77.4 (L) 80.0 - 100.0 fL   MCH 24.7 (L) 26.0 - 34.0 pg   MCHC 31.9 30.0 - 36.0 g/dL   RDW 59.5 (H) 63.8 - 75.6 %   Platelets 523 (H) 150 - 400 K/uL   nRBC 0.2 0.0 - 0.2 %   Neutrophils Relative % 74 %   Neutro Abs 11.9 (H) 1.7 - 7.7 K/uL   Lymphocytes Relative 15 %   Lymphs Abs 2.4 0.7 - 4.0 K/uL   Monocytes Relative 7 %   Monocytes Absolute 1.2 (H) 0.1 - 1.0 K/uL   Eosinophils Relative 0 %   Eosinophils Absolute 0.0 0.0 - 0.5 K/uL   Basophils Relative 0 %   Basophils Absolute 0.0 0.0 - 0.1 K/uL   Immature Granulocytes 4 %   Abs Immature Granulocytes 0.68 (H) 0.00 - 0.07 K/uL    Comment: Performed at [facility], [address] [place], [place] [address]  Magnesium     Status: Abnormal   Collection Time: 09/03/23  7:08 AM  Result Value Ref Range   Magnesium 1.5 (L) 1.7 - 2.4 mg/dL    Comment: Performed at [facility], [address] [place], [place] [address]  Phosphorus      Status: None   Collection Time: 09/03/23  7:08 AM  Result Value Ref Range   Phosphorus 2.6 2.5 - 4.6 mg/dL    Comment: Performed at [facility], [address] [place], [place] [address]  Basic metabolic panel     Status: Abnormal   Collection Time: 09/03/23  7:08 AM  Result Value Ref Range   Sodium 139 135 - 145 mmol/L   Potassium 3.3 (L) 3.5 - 5.1 mmol/L   Chloride 104 98 - 111 mmol/L   CO2 26 22 - 32 mmol/L   Glucose, Bld 83 70 - 99 mg/dL    Comment: Glucose reference range applies only to samples taken after fasting for at least 8 hours.   BUN 7 (L) 8 - 23 mg/dL   Creatinine, Ser 2.13 0.44 - 1.00 mg/dL   Calcium 8.6 (L) 8.9 - 10.3 mg/dL   GFR, Estimated >08 >65 mL/min    Comment: (NOTE) Calculated using the CKD-EPI Creatinine Equation (2021)    Anion gap 9 5 - 15    Comment: Performed at [facility], [address] [place], [place] [address]  Basic metabolic panel     Status: Abnormal   Collection Time: 09/04/23  4:35 AM  Result Value Ref Range   Sodium 138 135 - 145 mmol/L   Potassium 4.0 3.5 - 5.1 mmol/L   Chloride 102 98 - 111 mmol/L   CO2 27 22 - 32 mmol/L   Glucose, Bld 78 70 - 99 mg/dL    Comment: Glucose reference range applies only to samples taken after fasting for at least 8 hours.   BUN <5 (L) 8 - 23 mg/dL   Creatinine, Ser 6.29 0.44 - 1.00 mg/dL   Calcium 8.8 (L) 8.9 - 10.3 mg/dL   GFR, Estimated >52 >84 mL/min    Comment: (NOTE) Calculated using the CKD-EPI Creatinine Equation (2021)    Anion gap 9 5 - 15    Comment: Performed at [facility], [address] [place], [place] [address]  Magnesium     Status: None   Collection Time: 09/04/23  4:35 AM  Result Value Ref Range   Magnesium 2.2 1.7 - 2.4 mg/dL    Comment: Performed at [facility], [address] [place], [place] [address]  CBC with Differential/Platelet     Status: Abnormal   Collection Time: 09/04/23  4:35 AM  Result Value Ref Range   WBC  16.8 (H) 4.0 - 10.5 K/uL   RBC 3.90 3.87 - 5.11 MIL/uL   Hemoglobin 9.4 (L) 12.0 - 15.0 g/dL   HCT 25.3 (L) 66.4 - 40.3 %   MCV 77.7 (L) 80.0 - 100.0 fL   MCH 24.1 (L) 26.0 - 34.0 pg   MCHC 31.0 30.0 - 36.0 g/dL   RDW 47.4 (H) 25.9 - 56.3 %   Platelets 567 (H) 150 - 400 K/uL   nRBC 0.3 (H) 0.0 - 0.2 %   Neutrophils Relative % 73 %   Neutro Abs 12.3 (H) 1.7 - 7.7 K/uL   Lymphocytes Relative 16 %   Lymphs Abs 2.6 0.7 - 4.0 K/uL   Monocytes Relative 8 %  Monocytes Absolute 1.3 (H) 0.1 - 1.0 K/uL   Eosinophils Relative 0 %   Eosinophils Absolute 0.0 0.0 - 0.5 K/uL   Basophils Relative 0 %   Basophils Absolute 0.1 0.0 - 0.1 K/uL   Immature Granulocytes 3 %   Abs Immature Granulocytes 0.54 (H) 0.00 - 0.07 K/uL    Comment: Performed at [facility], [address], [place], [place] [address]  Basic metabolic panel     Status: Abnormal   Collection Time: 09/05/23  3:24 AM  Result Value Ref Range   Sodium 140 135 - 145 mmol/L   Potassium 3.4 (L) 3.5 - 5.1 mmol/L   Chloride 98 98 - 111 mmol/L   CO2 27 22 - 32 mmol/L   Glucose, Bld 78 70 - 99 mg/dL    Comment: Glucose reference range applies only to samples taken after fasting for at least 8 hours.   BUN <5 (L) 8 - 23 mg/dL   Creatinine, Ser 9.56 0.44 - 1.00 mg/dL   Calcium 8.9 8.9 - 38.7 mg/dL   GFR, Estimated >56 >43 mL/min    Comment: (NOTE) Calculated using the CKD-EPI Creatinine Equation (2021)    Anion gap 15 5 - 15    Comment: Performed at [facility], [address] [place], [place] [address]  CBC with Differential/Platelet     Status: Abnormal   Collection Time: 09/05/23  3:24 AM  Result Value Ref Range   WBC 17.2 (H) 4.0 - 10.5 K/uL   RBC 3.77 (L) 3.87 - 5.11 MIL/uL   Hemoglobin 9.2 (L) 12.0 - 15.0 g/dL   HCT 88.4 (L) 16.6 - 06.3 %   MCV 76.9 (L) 80.0 - 100.0 fL   MCH 24.4 (L) 26.0 - 34.0 pg   MCHC 31.7 30.0 - 36.0 g/dL   RDW 01.6 (H) 01.0 - 93.2 %   Platelets 580 (H) 150 - 400 K/uL   nRBC  0.2 0.0 - 0.2 %   Neutrophils Relative % 73 %   Neutro Abs 12.5 (H) 1.7 - 7.7 K/uL   Lymphocytes Relative 17 %   Lymphs Abs 3.0 0.7 - 4.0 K/uL   Monocytes Relative 7 %   Monocytes Absolute 1.3 (H) 0.1 - 1.0 K/uL   Eosinophils Relative 0 %   Eosinophils Absolute 0.0 0.0 - 0.5 K/uL   Basophils Relative 0 %   Basophils Absolute 0.0 0.0 - 0.1 K/uL   Immature Granulocytes 3 %   Abs Immature Granulocytes 0.46 (H) 0.00 - 0.07 K/uL    Comment: Performed at [facility], [address] [place], [place] [address]  CBC     Status: Abnormal   Collection Time: 09/06/23  5:22 AM  Result Value Ref Range   WBC 14.0 (H) 4.0 - 10.5 K/uL   RBC 3.94 3.87 - 5.11 MIL/uL   Hemoglobin 9.7 (L) 12.0 - 15.0 g/dL   HCT 22.0 (L) 25.4 - 27.0 %   MCV 78.4 (L) 80.0 - 100.0 fL   MCH 24.6 (L) 26.0 - 34.0 pg   MCHC 31.4 30.0 - 36.0 g/dL   RDW 62.3 (H) 76.2 - 83.1 %   Platelets 617 (H) 150 - 400 K/uL   nRBC 0.1 0.0 - 0.2 %    Comment: Performed at [facility], [address] [place], [place] [address]  Basic metabolic panel     Status: Abnormal   Collection Time: 09/06/23  5:22 AM  Result Value Ref Range   Sodium 140 135 -  145 mmol/L   Potassium 3.3 (L) 3.5 - 5.1 mmol/L   Chloride 99 98 - 111 mmol/L   CO2 30 22 - 32 mmol/L   Glucose, Bld 85 70 - 99 mg/dL    Comment: Glucose reference range applies only to samples taken after fasting for at least 8 hours.   BUN <5 (L) 8 - 23 mg/dL   Creatinine, Ser 5.78 0.44 - 1.00 mg/dL   Calcium 8.3 (L) 8.9 - 10.3 mg/dL   GFR, Estimated >46 >96 mL/min    Comment: (NOTE) Calculated using the CKD-EPI Creatinine Equation (2021)    Anion gap 11 5 - 15    Comment: Performed at [facility], [address] [place], [place] [address]  Hepatic function panel     Status: Abnormal   Collection Time: 09/29/23 12:38 AM  Result Value Ref Range   Total Protein 6.5 6.5 - 8.1 g/dL   Albumin 2.2 (L) 3.5 - 5.0 g/dL   AST 19 15 - 41 U/L   ALT 15 0 -  44 U/L   Alkaline Phosphatase 124 38 - 126 U/L   Total Bilirubin 0.4 0.3 - 1.2 mg/dL   Bilirubin, Direct <4.1 0.0 - 0.2 mg/dL   Indirect Bilirubin NOT CALCULATED 0.3 - 0.9 mg/dL    Comment: Performed at [facility], [address] [place], [place] [address]  Brain natriuretic peptide     Status: None   Collection Time: 09/29/23 12:38 AM  Result Value Ref Range   B Natriuretic Peptide 95.7 0.0 - 100.0 pg/mL    Comment: Performed at [facility], [address] [place], [place] [address]  Troponin I (High Sensitivity)     Status: Abnormal   Collection Time: 09/29/23 12:38 AM  Result Value Ref Range   Troponin I (High Sensitivity) 42 (H) <18 ng/L    Comment: (NOTE) Elevated high sensitivity troponin I (hsTnI) values and significant  changes across serial measurements may suggest ACS but many other  chronic and acute conditions are known to elevate hsTnI results.  Refer to the "Links" section for chest pain algorithms and additional  guidance. Performed at [facility], [address] [place], [place] [address]   Basic metabolic panel     Status: Abnormal   Collection Time: 09/29/23 12:37 PM  Result Value Ref Range   Sodium 137 135 - 145 mmol/L   Potassium 4.3 3.5 - 5.1 mmol/L   Chloride 104 98 - 111 mmol/L   CO2 22 22 - 32 mmol/L   Glucose, Bld 175 (H) 70 - 99 mg/dL    Comment: Glucose reference range applies only to samples taken after fasting for at least 8 hours.   BUN 29 (H) 8 - 23 mg/dL   Creatinine, Ser 4.03 (H) 0.44 - 1.00 mg/dL   Calcium 8.5 (L) 8.9 - 10.3 mg/dL   GFR, Estimated 28 (L) >60 mL/min    Comment: (NOTE) Calculated using the CKD-EPI Creatinine Equation (2021)    Anion gap 11 5 - 15    Comment: Performed at [facility], [address] [place], [place] [address]  CBC     Status: Abnormal   Collection Time: 09/29/23 12:37 PM  Result Value Ref Range   WBC 24.9 (H) 4.0 - 10.5 K/uL   RBC 3.35 (L) 3.87 - 5.11 MIL/uL    Hemoglobin 7.9 (L) 12.0 - 15.0 g/dL    Comment: Reticulocyte Hemoglobin testing may be clinically indicated, consider ordering this additional test  [id number]    HCT 25.6 (L) 36.0 - 46.0 %   MCV 76.4 (L) 80.0 - 100.0 fL   MCH 23.6 (L) 26.0 - 34.0 pg   MCHC 30.9 30.0 - 36.0 g/dL   RDW 47.8 (H) 29.5 - 62.1 %   Platelets 577 (H) 150 - 400 K/uL   nRBC 0.0 0.0 - 0.2 %    Comment: Performed at [facility], [address] [place], [place] [address]  Type and screen [facility]     Status: None   Collection Time: 09/29/23 12:37 PM  Result Value Ref Range   ABO/RH(D) A POS    Antibody Screen NEG    Sample Expiration 10/02/2023,2359    Unit Number [id number]    Blood Component Type RED CELLS,LR    Unit division 00    Status of Unit ISSUED,FINAL    Transfusion Status OK TO TRANSFUSE    Crossmatch Result      Compatible Performed at [facility], [address] [place], [place] [address]    Unit Number [id number]    Blood Component Type RED CELLS,LR    Unit division 00    Status of Unit ISSUED,FINAL    Transfusion Status OK TO TRANSFUSE    Crossmatch Result Compatible   Lactic acid, plasma     Status: Abnormal   Collection Time: 09/29/23 12:37 PM  Result Value Ref Range   Lactic Acid, Venous 2.6 (HH) 0.5 - 1.9 mmol/L    Comment: CRITICAL RESULT CALLED TO, READ BACK BY AND VERIFIED WITH KERRY NELSON @1318  09/29/23 MJU Performed at [facility], [address] [place], [place] [address]   Blood Culture (routine x 2)     Status: None   Collection Time: 09/29/23 12:37 PM   Specimen: BLOOD LEFT ARM  Result Value Ref Range   Specimen Description BLOOD LEFT ARM    Special Requests      BOTTLES DRAWN AEROBIC AND ANAEROBIC Blood Culture results may not be optimal due to an excessive volume of blood received in culture bottles   Culture      NO GROWTH 5 DAYS Performed at [facility], [address]  [place], [place] [address]    Report Status 10/05/2023 FINAL   BPAM RBC     Status: None   Collection Time: 09/29/23 12:37 PM  Result Value Ref Range   ISSUE DATE / TIME [id number]    Blood Product Unit Number [id number]    PRODUCT CODE [id number]    Unit Type and Rh 6200    Blood Product Expiration Date 220254270623    ISSUE DATE / TIME [id number]    Blood Product Unit Number [id number]    PRODUCT CODE [id number]    Unit Type and Rh 6200    Blood Product Expiration Date [id number]   Procalcitonin     Status: None   Collection Time: 09/29/23 12:37 PM  Result Value Ref Range   Procalcitonin 0.49 ng/mL    Comment:        Interpretation: PCT (Procalcitonin) <= 0.5 ng/mL: Systemic infection (sepsis) is not likely. Local bacterial infection is possible. (NOTE)       Sepsis PCT Algorithm           Lower Respiratory Tract  Infection PCT Algorithm    ----------------------------     ----------------------------         PCT < 0.25 ng/mL                PCT < 0.10 ng/mL          Strongly encourage             Strongly discourage   discontinuation of antibiotics    initiation of antibiotics    ----------------------------     -----------------------------       PCT 0.25 - 0.50 ng/mL            PCT 0.10 - 0.25 ng/mL               OR       >80% decrease in PCT            Discourage initiation of                                            antibiotics      Encourage discontinuation           of antibiotics    ----------------------------     -----------------------------         PCT >= 0.50 ng/mL              PCT 0.26 - 0.50 ng/mL               AND        <80% decrease in PCT             Encourage initiation of                                             antibiotics       Encourage continuation           of antibiotics    ----------------------------     -----------------------------        PCT >= 0.50 ng/mL                  PCT > 0.50 ng/mL                AND         increase in PCT                  Strongly encourage                                      initiation of antibiotics    Strongly encourage escalation           of antibiotics                                     -----------------------------                                           PCT <= 0.25 ng/mL  OR                                        > 80% decrease in PCT                                      Discontinue / Do not initiate                                             antibiotics  Performed at [facility], [address] [place], [place] [address]   Hemoglobin A1c     Status: Abnormal   Collection Time: 09/29/23 12:37 PM  Result Value Ref Range   Hgb A1c MFr Bld 6.3 (H) 4.8 - 5.6 %    Comment: (NOTE) Pre diabetes:          5.7%-6.4%  Diabetes:              >6.4%  Glycemic control for   <7.0% adults with diabetes    Mean Plasma Glucose 134.11 mg/dL    Comment: Performed at [facility], [address], [place], [place] [address]  Lactic acid, plasma     Status: None   Collection Time: 09/29/23  1:08 PM  Result Value Ref Range   Lactic Acid, Venous 1.5 0.5 - 1.9 mmol/L    Comment: Performed at [facility], [address] [place], [place] [address]  Blood Culture (routine x 2)     Status: None   Collection Time: 09/29/23  1:08 PM   Specimen: BLOOD  Result Value Ref Range   Specimen Description BLOOD BLOOD RIGHT FOREARM    Special Requests      BOTTLES DRAWN AEROBIC AND ANAEROBIC Blood Culture results may not be optimal due to an excessive volume of blood received in culture bottles   Culture      NO GROWTH 5 DAYS Performed at [facility], [address] [place], [place] [address]    Report Status 10/04/2023 FINAL   Resp panel by RT-PCR (RSV, Flu A&B, Covid) Anterior Nasal Swab     Status: None   Collection Time: 09/29/23  1:53 PM   Specimen: Anterior Nasal Swab   Result Value Ref Range   SARS Coronavirus 2 by RT PCR NEGATIVE NEGATIVE    Comment: (NOTE) SARS-CoV-2 target nucleic acids are NOT DETECTED.  The SARS-CoV-2 RNA is generally detectable in upper respiratory specimens during the acute phase of infection. The lowest concentration of SARS-CoV-2 viral copies this assay can detect is 138 copies/mL. A negative result does not preclude SARS-Cov-2 infection and should not be used as the sole basis for treatment or other patient management decisions. A negative result may occur with  improper specimen collection/handling, submission of specimen other than nasopharyngeal swab, presence of viral mutation(Ranjit Ashurst) within the areas targeted by this assay, and inadequate number of viral copies(<138 copies/mL). A negative result must be combined with clinical observations, patient history, and epidemiological information. The expected result is Negative.  Fact Sheet for Patients:  BloggerCourse.com  Fact Sheet for Healthcare Providers:  SeriousBroker.it  This test is no t yet approved or cleared by the [place] FDA and  has  been authorized for detection and/or diagnosis of SARS-CoV-2 by FDA under an Emergency Use Authorization (EUA). This EUA will remain  in effect (meaning this test can be used) for the duration of the COVID-19 declaration under Section 564(b)(1) of the Act, 21 U.Deliah Strehlow.C.section 360bbb-3(b)(1), unless the authorization is terminated  or revoked sooner.       Influenza A by PCR NEGATIVE NEGATIVE   Influenza B by PCR NEGATIVE NEGATIVE    Comment: (NOTE) The Xpert Xpress SARS-CoV-2/FLU/RSV plus assay is intended as an aid in the diagnosis of influenza from Nasopharyngeal swab specimens and should not be used as a sole basis for treatment. Nasal washings and aspirates are unacceptable for Xpert Xpress SARS-CoV-2/FLU/RSV testing.  Fact Sheet for  Patients: BloggerCourse.com  Fact Sheet for Healthcare Providers: SeriousBroker.it  This test is not yet approved or cleared by the [place] FDA and has been authorized for detection and/or diagnosis of SARS-CoV-2 by FDA under an Emergency Use Authorization (EUA). This EUA will remain in effect (meaning this test can be used) for the duration of the COVID-19 declaration under Section 564(b)(1) of the Act, 21 U.Belinda Bringhurst.C. section 360bbb-3(b)(1), unless the authorization is terminated or revoked.     Resp Syncytial Virus by PCR NEGATIVE NEGATIVE    Comment: (NOTE) Fact Sheet for Patients: BloggerCourse.com  Fact Sheet for Healthcare Providers: SeriousBroker.it  This test is not yet approved or cleared by the [place] FDA and has been authorized for detection and/or diagnosis of SARS-CoV-2 by FDA under an Emergency Use Authorization (EUA). This EUA will remain in effect (meaning this test can be used) for the duration of the COVID-19 declaration under Section 564(b)(1) of the Act, 21 U.Nevaan Bunton.C. section 360bbb-3(b)(1), unless the authorization is terminated or revoked.  Performed at [facility], [address] [place], [place] [address]   Prepare RBC (crossmatch)     Status: None   Collection Time: 09/29/23  2:52 PM  Result Value Ref Range   Order Confirmation      ORDER PROCESSED BY BLOOD BANK Performed at [facility], [address] [place], [place] [address]   Gastrointestinal Panel by PCR , Stool     Status: None   Collection Time: 09/29/23  3:05 PM   Specimen: Stool  Result Value Ref Range   Campylobacter species NOT DETECTED NOT DETECTED   Plesimonas shigelloides NOT DETECTED NOT DETECTED   Salmonella species NOT DETECTED NOT DETECTED   Yersinia enterocolitica NOT DETECTED NOT DETECTED   Vibrio species NOT DETECTED NOT DETECTED   Vibrio cholerae  NOT DETECTED NOT DETECTED   Enteroaggregative E coli (EAEC) NOT DETECTED NOT DETECTED   Enteropathogenic E coli (EPEC) NOT DETECTED NOT DETECTED   Enterotoxigenic E coli (ETEC) NOT DETECTED NOT DETECTED   Shiga like toxin producing E coli (STEC) NOT DETECTED NOT DETECTED   Shigella/Enteroinvasive E coli (EIEC) NOT DETECTED NOT DETECTED   Cryptosporidium NOT DETECTED NOT DETECTED   Cyclospora cayetanensis NOT DETECTED NOT DETECTED   Entamoeba histolytica NOT DETECTED NOT DETECTED   Giardia lamblia NOT DETECTED NOT DETECTED   Adenovirus F40/41 NOT DETECTED NOT DETECTED   Astrovirus NOT DETECTED NOT DETECTED   Norovirus GI/GII NOT DETECTED NOT DETECTED   Rotavirus A NOT DETECTED NOT DETECTED   Sapovirus (I, II, IV, and V) NOT DETECTED NOT DETECTED    Comment: Performed at [facility], [address] [place], [place] [address]  Urinalysis, w/ Reflex to Culture (Infection Suspected) -Urine, Clean Catch  Status: Abnormal   Collection Time: 09/29/23  4:40 PM  Result Value Ref Range   Specimen Source URINE, CLEAN CATCH    Color, Urine YELLOW (A) YELLOW   APPearance HAZY (A) CLEAR   Specific Gravity, Urine 1.009 1.005 - 1.030   pH 5.0 5.0 - 8.0   Glucose, UA NEGATIVE NEGATIVE mg/dL   Hgb urine dipstick SMALL (A) NEGATIVE   Bilirubin Urine NEGATIVE NEGATIVE   Ketones, ur NEGATIVE NEGATIVE mg/dL   Protein, ur NEGATIVE NEGATIVE mg/dL   Nitrite NEGATIVE NEGATIVE   Leukocytes,Ua NEGATIVE NEGATIVE   RBC / HPF 0 0 - 5 RBC/hpf   WBC, UA 0-5 0 - 5 WBC/hpf    Comment:        Reflex urine culture not performed if WBC <=10, OR if Squamous epithelial cells >5. If Squamous epithelial cells >5 suggest recollection.    Bacteria, UA RARE (A) NONE SEEN   Squamous Epithelial / HPF 0-5 0 - 5 /HPF   Mucus PRESENT    Hyaline Casts, UA PRESENT     Comment: Performed at [facility], [address] [place], [place] [address]  Prepare RBC (crossmatch)     Status: None    Collection Time: 09/29/23  7:38 PM  Result Value Ref Range   Order Confirmation      ORDER PROCESSED BY BLOOD BANK Performed at [facility], [address] [place], [place] [address]   CBG monitoring, ED     Status: Abnormal   Collection Time: 09/29/23  9:00 PM  Result Value Ref Range   Glucose-Capillary 151 (H) 70 - 99 mg/dL    Comment: Glucose reference range applies only to samples taken after fasting for at least 8 hours.  CBG monitoring, ED     Status: None   Collection Time: 09/29/23 11:35 PM  Result Value Ref Range   Glucose-Capillary 94 70 - 99 mg/dL    Comment: Glucose reference range applies only to samples taken after fasting for at least 8 hours.  Phosphorus     Status: None   Collection Time: 09/30/23 12:38 AM  Result Value Ref Range   Phosphorus 4.0 2.5 - 4.6 mg/dL    Comment: Performed at [facility], [address] [place], [place] [address]  Magnesium     Status: None   Collection Time: 09/30/23 12:38 AM  Result Value Ref Range   Magnesium 1.8 1.7 - 2.4 mg/dL    Comment: Performed at [facility], [address] [place], [place] [address]  CBC     Status: Abnormal   Collection Time: 09/30/23 12:38 AM  Result Value Ref Range   WBC 29.0 (H) 4.0 - 10.5 K/uL   RBC 3.97 3.87 - 5.11 MIL/uL   Hemoglobin 9.8 (L) 12.0 - 15.0 g/dL   HCT 29.5 (L) 62.1 - 30.8 %   MCV 80.9 80.0 - 100.0 fL   MCH 24.7 (L) 26.0 - 34.0 pg   MCHC 30.5 30.0 - 36.0 g/dL   RDW 65.7 (H) 84.6 - 96.2 %   Platelets 520 (H) 150 - 400 K/uL   nRBC 0.0 0.0 - 0.2 %    Comment: Performed at [facility], [address], [place], [place] [address]  Comprehensive metabolic panel     Status: Abnormal   Collection Time: 09/30/23 12:38 AM  Result Value Ref Range   Sodium 142 135 - 145 mmol/L   Potassium 3.9 3.5 - 5.1 mmol/L   Chloride 109 98 -  111 mmol/L   CO2 21 (L) 22 - 32 mmol/L   Glucose, Bld 91 70 - 99 mg/dL    Comment: Glucose reference range applies  only to samples taken after fasting for at least 8 hours.   BUN 20 8 - 23 mg/dL   Creatinine, Ser 4.33 (H) 0.44 - 1.00 mg/dL   Calcium 7.9 (L) 8.9 - 10.3 mg/dL   Total Protein 6.5 6.5 - 8.1 g/dL   Albumin 2.3 (L) 3.5 - 5.0 g/dL   AST 20 15 - 41 U/L   ALT 16 0 - 44 U/L   Alkaline Phosphatase 116 38 - 126 U/L   Total Bilirubin 0.4 0.3 - 1.2 mg/dL   GFR, Estimated 49 (L) >60 mL/min    Comment: (NOTE) Calculated using the CKD-EPI Creatinine Equation (2021)    Anion gap 12 5 - 15    Comment: Performed at [facility], [address], [place], [place] [address]  Troponin I (High Sensitivity)     Status: Abnormal   Collection Time: 09/30/23 12:38 AM  Result Value Ref Range   Troponin I (High Sensitivity) 37 (H) <18 ng/L    Comment: (NOTE) Elevated high sensitivity troponin I (hsTnI) values and significant  changes across serial measurements may suggest ACS but many other  chronic and acute conditions are known to elevate hsTnI results.  Refer to the "Links" section for chest pain algorithms and additional  guidance. Performed at [facility], [address] [place], [place] [address]   CBG monitoring, ED     Status: None   Collection Time: 09/30/23  7:17 AM  Result Value Ref Range   Glucose-Capillary 91 70 - 99 mg/dL    Comment: Glucose reference range applies only to samples taken after fasting for at least 8 hours.  ECHOCARDIOGRAM COMPLETE BUBBLE STUDY     Status: None   Collection Time: 09/30/23  8:55 AM  Result Value Ref Range   Ao pk vel 1.96 m/Amaury Kuzel   AR max vel 2.12 cm2   AV Peak grad 15.4 mmHg   Cammy Sanjurjo' Lateral 2.10 cm   Est EF 65 - 70%   CBG monitoring, ED     Status: Abnormal   Collection Time: 09/30/23 11:11 AM  Result Value Ref Range   Glucose-Capillary 147 (H) 70 - 99 mg/dL    Comment: Glucose reference range applies only to samples taken after fasting for at least 8 hours.  C Difficile Quick Screen w PCR reflex     Status: Abnormal   Collection Time:  09/30/23  3:14 PM   Specimen: STOOL  Result Value Ref Range   C Diff antigen POSITIVE (A) NEGATIVE   C Diff toxin NEGATIVE NEGATIVE   C Diff interpretation Results are indeterminate. See PCR results.     Comment: Performed at [facility], [address] [place], [place] [address]  C. Diff by PCR, Reflexed     Status: None   Collection Time: 09/30/23  3:14 PM  Result Value Ref Range   Toxigenic C. Difficile by PCR NEGATIVE NEGATIVE    Comment: Patient is colonized with non toxigenic C. difficile. May not need treatment unless significant symptoms are present. Performed at [facility], [address] [place], [place] [address]   Glucose, capillary     Status: None   Collection Time: 09/30/23  4:03 PM  Result Value Ref Range   Glucose-Capillary 93 70 - 99 mg/dL    Comment: Glucose reference range applies  only to samples taken after fasting for at least 8 hours.  Phosphorus     Status: None   Collection Time: 10/01/23  5:37 AM  Result Value Ref Range   Phosphorus 3.0 2.5 - 4.6 mg/dL    Comment: Performed at [facility], [address] [place], [place] [address]  Magnesium     Status: None   Collection Time: 10/01/23  5:37 AM  Result Value Ref Range   Magnesium 1.8 1.7 - 2.4 mg/dL    Comment: Performed at [facility], [address] [place], [place] [address]  CBC     Status: Abnormal   Collection Time: 10/01/23  5:37 AM  Result Value Ref Range   WBC 20.8 (H) 4.0 - 10.5 K/uL   RBC 4.20 3.87 - 5.11 MIL/uL   Hemoglobin 10.3 (L) 12.0 - 15.0 g/dL   HCT 46.9 (L) 62.9 - 52.8 %   MCV 77.9 (L) 80.0 - 100.0 fL   MCH 24.5 (L) 26.0 - 34.0 pg   MCHC 31.5 30.0 - 36.0 g/dL   RDW 41.3 (H) 24.4 - 01.0 %   Platelets 494 (H) 150 - 400 K/uL   nRBC 0.0 0.0 - 0.2 %    Comment: Performed at [facility], [address], [place], [place] [address]  Basic metabolic panel     Status: Abnormal   Collection Time: 10/01/23  5:37 AM  Result Value  Ref Range   Sodium 140 135 - 145 mmol/L   Potassium 4.0 3.5 - 5.1 mmol/L   Chloride 107 98 - 111 mmol/L   CO2 22 22 - 32 mmol/L   Glucose, Bld 92 70 - 99 mg/dL    Comment: Glucose reference range applies only to samples taken after fasting for at least 8 hours.   BUN 11 8 - 23 mg/dL   Creatinine, Ser 6.64 0.44 - 1.00 mg/dL   Calcium 8.6 (L) 8.9 - 10.3 mg/dL   GFR, Estimated >40 >34 mL/min    Comment: (NOTE) Calculated using the CKD-EPI Creatinine Equation (2021)    Anion gap 11 5 - 15    Comment: Performed at [facility], [address] [place], [place] [address]  Magnesium     Status: None   Collection Time: 10/02/23  5:47 AM  Result Value Ref Range   Magnesium 1.8 1.7 - 2.4 mg/dL    Comment: Performed at [facility], [address] [place], [place] [address]  Basic metabolic panel     Status: Abnormal   Collection Time: 10/02/23  5:47 AM  Result Value Ref Range   Sodium 138 135 - 145 mmol/L   Potassium 3.8 3.5 - 5.1 mmol/L   Chloride 104 98 - 111 mmol/L   CO2 24 22 - 32 mmol/L   Glucose, Bld 73 70 - 99 mg/dL    Comment: Glucose reference range applies only to samples taken after fasting for at least 8 hours.   BUN 10 8 - 23 mg/dL   Creatinine, Ser 5.64 0.44 - 1.00 mg/dL   Calcium 8.2 (L) 8.9 - 10.3 mg/dL   GFR, Estimated >33 >29 mL/min    Comment: (NOTE) Calculated using the CKD-EPI Creatinine Equation (2021)    Anion gap 10 5 - 15    Comment: Performed at [facility], [address], [place], [place] [address]  CBC     Status: Abnormal   Collection Time: 10/02/23  5:47 AM  Result Value Ref Range  WBC 16.3 (H) 4.0 - 10.5 K/uL   RBC 4.21 3.87 - 5.11 MIL/uL   Hemoglobin 10.4 (L) 12.0 - 15.0 g/dL   HCT 24.4 (L) 01.0 - 27.2 %   MCV 78.9 (L) 80.0 - 100.0 fL   MCH 24.7 (L) 26.0 - 34.0 pg   MCHC 31.3 30.0 - 36.0 g/dL   RDW 53.6 (H) 64.4 - 03.4 %   Platelets 520 (H) 150 - 400 K/uL   nRBC 0.0 0.0 - 0.2 %    Comment: Performed at  [facility], [address] [place], [place] [address]  C-reactive protein     Status: Abnormal   Collection Time: 10/02/23 11:02 AM  Result Value Ref Range   CRP 13.4 (H) <1.0 mg/dL    Comment: Performed at [facility], [address], [place], [place] [address]      Assessment & Plan:  As per problem list  Problem List Items Addressed This Visit   None   No follow-ups on file.   Total time spent: 30 minutes  Luna Fuse, MD  10/16/2023   This document may have been prepared by [facility] Voice Recognition software and as such may include unintentional dictation errors.

## 2023-10-17 LAB — CBC WITH DIFF/PLATELET
Basophils Absolute: 0 10*3/uL (ref 0.0–0.2)
Basos: 0 %
EOS (ABSOLUTE): 0 10*3/uL (ref 0.0–0.4)
Eos: 0 %
Hematocrit: 34.6 % (ref 34.0–46.6)
Hemoglobin: 10.7 g/dL — ABNORMAL LOW (ref 11.1–15.9)
Immature Grans (Abs): 0 10*3/uL (ref 0.0–0.1)
Immature Granulocytes: 0 %
Lymphocytes Absolute: 2.3 10*3/uL (ref 0.7–3.1)
Lymphs: 22 %
MCH: 24.7 pg — ABNORMAL LOW (ref 26.6–33.0)
MCHC: 30.9 g/dL — ABNORMAL LOW (ref 31.5–35.7)
MCV: 80 fL (ref 79–97)
Monocytes Absolute: 0.9 10*3/uL (ref 0.1–0.9)
Monocytes: 8 %
Neutrophils Absolute: 6.9 10*3/uL (ref 1.4–7.0)
Neutrophils: 70 %
Platelets: 652 10*3/uL — ABNORMAL HIGH (ref 150–450)
RBC: 4.33 x10E6/uL (ref 3.77–5.28)
RDW: 18.2 % — ABNORMAL HIGH (ref 11.7–15.4)
WBC: 10.1 10*3/uL (ref 3.4–10.8)

## 2023-10-18 ENCOUNTER — Telehealth: Payer: Self-pay

## 2023-10-19 DIAGNOSIS — M7061 Trochanteric bursitis, right hip: Secondary | ICD-10-CM | POA: Diagnosis not present

## 2023-10-19 DIAGNOSIS — M7918 Myalgia, other site: Secondary | ICD-10-CM | POA: Diagnosis not present

## 2023-10-19 DIAGNOSIS — M47816 Spondylosis without myelopathy or radiculopathy, lumbar region: Secondary | ICD-10-CM | POA: Diagnosis not present

## 2023-10-19 DIAGNOSIS — M7062 Trochanteric bursitis, left hip: Secondary | ICD-10-CM | POA: Diagnosis not present

## 2023-10-19 DIAGNOSIS — G8929 Other chronic pain: Secondary | ICD-10-CM | POA: Diagnosis not present

## 2023-10-20 ENCOUNTER — Emergency Department: Payer: 59

## 2023-10-20 ENCOUNTER — Inpatient Hospital Stay
Admission: EM | Admit: 2023-10-20 | Discharge: 2023-10-23 | DRG: 872 | Disposition: A | Discharge status: Home / Self Care | Payer: 59 | Attending: Internal Medicine | Admitting: Internal Medicine

## 2023-10-20 DIAGNOSIS — E739 Lactose intolerance, unspecified: Secondary | ICD-10-CM | POA: Diagnosis not present

## 2023-10-20 DIAGNOSIS — K279 Peptic ulcer, site unspecified, unspecified as acute or chronic, without hemorrhage or perforation: Secondary | ICD-10-CM | POA: Insufficient documentation

## 2023-10-20 DIAGNOSIS — Z9981 Dependence on supplemental oxygen: Secondary | ICD-10-CM | POA: Diagnosis not present

## 2023-10-20 DIAGNOSIS — R0689 Other abnormalities of breathing: Secondary | ICD-10-CM | POA: Diagnosis not present

## 2023-10-20 DIAGNOSIS — D649 Anemia, unspecified: Secondary | ICD-10-CM | POA: Diagnosis not present

## 2023-10-20 DIAGNOSIS — S0990XA Unspecified injury of head, initial encounter: Secondary | ICD-10-CM | POA: Diagnosis not present

## 2023-10-20 DIAGNOSIS — Z79899 Other long term (current) drug therapy: Secondary | ICD-10-CM

## 2023-10-20 DIAGNOSIS — Z808 Family history of malignant neoplasm of other organs or systems: Secondary | ICD-10-CM

## 2023-10-20 DIAGNOSIS — Z8711 Personal history of peptic ulcer disease: Secondary | ICD-10-CM

## 2023-10-20 DIAGNOSIS — F0393 Unspecified dementia, unspecified severity, with mood disturbance: Secondary | ICD-10-CM | POA: Diagnosis present

## 2023-10-20 DIAGNOSIS — J449 Chronic obstructive pulmonary disease, unspecified: Secondary | ICD-10-CM | POA: Diagnosis present

## 2023-10-20 DIAGNOSIS — J4489 Other specified chronic obstructive pulmonary disease: Secondary | ICD-10-CM | POA: Diagnosis present

## 2023-10-20 DIAGNOSIS — Z888 Allergy status to other drugs, medicaments and biological substances status: Secondary | ICD-10-CM | POA: Diagnosis not present

## 2023-10-20 DIAGNOSIS — R6520 Severe sepsis without septic shock: Secondary | ICD-10-CM | POA: Diagnosis not present

## 2023-10-20 DIAGNOSIS — R000 Tachycardia, unspecified: Secondary | ICD-10-CM | POA: Diagnosis not present

## 2023-10-20 DIAGNOSIS — M25511 Pain in right shoulder: Secondary | ICD-10-CM | POA: Diagnosis not present

## 2023-10-20 DIAGNOSIS — N3090 Cystitis, unspecified without hematuria: Secondary | ICD-10-CM | POA: Diagnosis not present

## 2023-10-20 DIAGNOSIS — F325 Major depressive disorder, single episode, in full remission: Secondary | ICD-10-CM | POA: Diagnosis present

## 2023-10-20 DIAGNOSIS — Z90710 Acquired absence of both cervix and uterus: Secondary | ICD-10-CM

## 2023-10-20 DIAGNOSIS — E785 Hyperlipidemia, unspecified: Secondary | ICD-10-CM | POA: Diagnosis not present

## 2023-10-20 DIAGNOSIS — N390 Urinary tract infection, site not specified: Secondary | ICD-10-CM | POA: Diagnosis not present

## 2023-10-20 DIAGNOSIS — A419 Sepsis, unspecified organism: Secondary | ICD-10-CM | POA: Diagnosis not present

## 2023-10-20 DIAGNOSIS — N179 Acute kidney failure, unspecified: Secondary | ICD-10-CM | POA: Diagnosis not present

## 2023-10-20 DIAGNOSIS — Z803 Family history of malignant neoplasm of breast: Secondary | ICD-10-CM

## 2023-10-20 DIAGNOSIS — Z91041 Radiographic dye allergy status: Secondary | ICD-10-CM | POA: Diagnosis not present

## 2023-10-20 DIAGNOSIS — A4150 Gram-negative sepsis, unspecified: Secondary | ICD-10-CM | POA: Diagnosis not present

## 2023-10-20 DIAGNOSIS — G40909 Epilepsy, unspecified, not intractable, without status epilepticus: Secondary | ICD-10-CM

## 2023-10-20 DIAGNOSIS — I10 Essential (primary) hypertension: Secondary | ICD-10-CM | POA: Diagnosis not present

## 2023-10-20 DIAGNOSIS — Z981 Arthrodesis status: Secondary | ICD-10-CM

## 2023-10-20 DIAGNOSIS — F0394 Unspecified dementia, unspecified severity, with anxiety: Secondary | ICD-10-CM | POA: Diagnosis present

## 2023-10-20 DIAGNOSIS — D75839 Thrombocytosis, unspecified: Secondary | ICD-10-CM | POA: Diagnosis not present

## 2023-10-20 DIAGNOSIS — E7800 Pure hypercholesterolemia, unspecified: Secondary | ICD-10-CM | POA: Diagnosis present

## 2023-10-20 DIAGNOSIS — R55 Syncope and collapse: Secondary | ICD-10-CM | POA: Diagnosis not present

## 2023-10-20 DIAGNOSIS — J984 Other disorders of lung: Secondary | ICD-10-CM | POA: Diagnosis not present

## 2023-10-20 DIAGNOSIS — R918 Other nonspecific abnormal finding of lung field: Secondary | ICD-10-CM | POA: Diagnosis not present

## 2023-10-20 DIAGNOSIS — G894 Chronic pain syndrome: Secondary | ICD-10-CM | POA: Diagnosis present

## 2023-10-20 DIAGNOSIS — E861 Hypovolemia: Secondary | ICD-10-CM | POA: Diagnosis not present

## 2023-10-20 DIAGNOSIS — Z880 Allergy status to penicillin: Secondary | ICD-10-CM

## 2023-10-20 DIAGNOSIS — Z7982 Long term (current) use of aspirin: Secondary | ICD-10-CM

## 2023-10-20 DIAGNOSIS — E876 Hypokalemia: Secondary | ICD-10-CM | POA: Insufficient documentation

## 2023-10-20 DIAGNOSIS — Z9181 History of falling: Secondary | ICD-10-CM

## 2023-10-20 DIAGNOSIS — M19011 Primary osteoarthritis, right shoulder: Secondary | ICD-10-CM | POA: Diagnosis present

## 2023-10-20 DIAGNOSIS — I959 Hypotension, unspecified: Secondary | ICD-10-CM | POA: Diagnosis not present

## 2023-10-20 DIAGNOSIS — Z9049 Acquired absence of other specified parts of digestive tract: Secondary | ICD-10-CM

## 2023-10-20 DIAGNOSIS — R109 Unspecified abdominal pain: Secondary | ICD-10-CM | POA: Diagnosis not present

## 2023-10-20 DIAGNOSIS — N3000 Acute cystitis without hematuria: Secondary | ICD-10-CM | POA: Diagnosis not present

## 2023-10-20 DIAGNOSIS — G4733 Obstructive sleep apnea (adult) (pediatric): Secondary | ICD-10-CM | POA: Diagnosis present

## 2023-10-20 LAB — BASIC METABOLIC PANEL
Anion gap: 12 (ref 5–15)
BUN: 26 mg/dL — ABNORMAL HIGH (ref 8–23)
CO2: 21 mmol/L — ABNORMAL LOW (ref 22–32)
Calcium: 8.4 mg/dL — ABNORMAL LOW (ref 8.9–10.3)
Chloride: 101 mmol/L (ref 98–111)
Creatinine, Ser: 3.04 mg/dL — ABNORMAL HIGH (ref 0.44–1.00)
GFR, Estimated: 16 mL/min — ABNORMAL LOW (ref >60)
Glucose, Bld: 150 mg/dL — ABNORMAL HIGH (ref 70–99)
Potassium: 3.6 mmol/L (ref 3.5–5.1)
Sodium: 134 mmol/L — ABNORMAL LOW (ref 135–145)

## 2023-10-20 LAB — URINALYSIS, W/ REFLEX TO CULTURE (INFECTION SUSPECTED)
Bilirubin Urine: NEGATIVE
Glucose, UA: 50 mg/dL — AB
Ketones, ur: NEGATIVE mg/dL
Nitrite: NEGATIVE
Protein, ur: 30 mg/dL — AB
Specific Gravity, Urine: 1.006 (ref 1.005–1.030)
WBC, UA: >50 WBC/hpf (ref 0–5)
pH: 6 (ref 5.0–8.0)

## 2023-10-20 LAB — CBC WITH DIFFERENTIAL/PLATELET
Abs Immature Granulocytes: 0.08 10*3/uL — ABNORMAL HIGH (ref 0.00–0.07)
Basophils Absolute: 0 10*3/uL (ref 0.0–0.1)
Basophils Relative: 0 %
Eosinophils Absolute: 0 10*3/uL (ref 0.0–0.5)
Eosinophils Relative: 0 %
HCT: 34.4 % — ABNORMAL LOW (ref 36.0–46.0)
Hemoglobin: 10.2 g/dL — ABNORMAL LOW (ref 12.0–15.0)
Immature Granulocytes: 1 %
Lymphocytes Relative: 11 %
Lymphs Abs: 1.6 10*3/uL (ref 0.7–4.0)
MCH: 24.8 pg — ABNORMAL LOW (ref 26.0–34.0)
MCHC: 29.7 g/dL — ABNORMAL LOW (ref 30.0–36.0)
MCV: 83.7 fL (ref 80.0–100.0)
Monocytes Absolute: 0.9 10*3/uL (ref 0.1–1.0)
Monocytes Relative: 6 %
Neutro Abs: 12.5 10*3/uL — ABNORMAL HIGH (ref 1.7–7.7)
Neutrophils Relative %: 82 %
Platelets: 453 10*3/uL — ABNORMAL HIGH (ref 150–400)
RBC: 4.11 MIL/uL (ref 3.87–5.11)
RDW: 19.3 % — ABNORMAL HIGH (ref 11.5–15.5)
WBC: 15.2 10*3/uL — ABNORMAL HIGH (ref 4.0–10.5)
nRBC: 0 % (ref 0.0–0.2)

## 2023-10-20 LAB — TROPONIN I (HIGH SENSITIVITY)
Troponin I (High Sensitivity): 4 ng/L (ref <18)
Troponin I (High Sensitivity): 4 ng/L (ref <18)

## 2023-10-20 LAB — LACTIC ACID, PLASMA: Lactic Acid, Venous: 1.7 mmol/L (ref 0.5–1.9)

## 2023-10-20 MED ORDER — ATORVASTATIN CALCIUM 20 MG PO TABS
80.0000 mg | ORAL_TABLET | Freq: Every day | ORAL | Status: DC
  Administered 2023-10-21 – 2023-10-22 (×3): 80 mg via ORAL
  Filled 2023-10-20 (×3): qty 4

## 2023-10-20 MED ORDER — DIPHENHYDRAMINE HCL 25 MG PO CAPS: 25.0000 mg | ORAL_CAPSULE | Freq: Four times a day (QID) | ORAL | Status: DC | PRN

## 2023-10-20 MED ORDER — SODIUM CHLORIDE 0.9 % IV BOLUS (SEPSIS)
250.0000 mL | Freq: Once | INTRAVENOUS | Status: AC
  Administered 2023-10-20: 250 mL via INTRAVENOUS

## 2023-10-20 MED ORDER — ASPIRIN 81 MG PO TBEC
81.0000 mg | DELAYED_RELEASE_TABLET | Freq: Every day | ORAL | Status: DC
  Administered 2023-10-21 – 2023-10-23 (×3): 81 mg via ORAL
  Filled 2023-10-20 (×3): qty 1

## 2023-10-20 MED ORDER — SODIUM CHLORIDE 0.9 % IV SOLN: 2.0000 g | INTRAVENOUS | Status: DC

## 2023-10-20 MED ORDER — LINACLOTIDE 290 MCG PO CAPS
290.0000 ug | ORAL_CAPSULE | Freq: Every morning | ORAL | Status: DC
Start: 2023-10-21 — End: 2023-10-20

## 2023-10-20 MED ORDER — MIRTAZAPINE 15 MG PO TABS
15.0000 mg | ORAL_TABLET | Freq: Every day | ORAL | Status: DC
  Administered 2023-10-21 – 2023-10-22 (×3): 15 mg via ORAL
  Filled 2023-10-20 (×3): qty 1

## 2023-10-20 MED ORDER — SODIUM CHLORIDE 0.9 % IV SOLN
2.0000 g | INTRAVENOUS | Status: DC
  Administered 2023-10-20 – 2023-10-22 (×3): 2 g via INTRAVENOUS
  Filled 2023-10-20 (×4): qty 20

## 2023-10-20 MED ORDER — DONEPEZIL HCL 5 MG PO TABS
10.0000 mg | ORAL_TABLET | Freq: Every morning | ORAL | Status: DC
  Administered 2023-10-21 – 2023-10-23 (×3): 10 mg via ORAL
  Filled 2023-10-20 (×3): qty 2

## 2023-10-20 MED ORDER — LEVETIRACETAM 750 MG PO TABS
750.0000 mg | ORAL_TABLET | Freq: Every morning | ORAL | Status: DC
  Administered 2023-10-21 – 2023-10-23 (×3): 750 mg via ORAL
  Filled 2023-10-20 (×4): qty 1

## 2023-10-20 MED ORDER — DICYCLOMINE HCL 20 MG PO TABS: 20.0000 mg | ORAL_TABLET | Freq: Four times a day (QID) | ORAL | Status: DC | PRN

## 2023-10-20 MED ORDER — ALBUTEROL SULFATE (2.5 MG/3ML) 0.083% IN NEBU: 2.5000 mg | INHALATION_SOLUTION | Freq: Four times a day (QID) | RESPIRATORY_TRACT | Status: DC | PRN

## 2023-10-20 MED ORDER — MAGNESIUM HYDROXIDE 400 MG/5ML PO SUSP: 30.0000 mL | Freq: Every day | ORAL | Status: DC | PRN

## 2023-10-20 MED ORDER — CITALOPRAM HYDROBROMIDE 10 MG PO TABS
40.0000 mg | ORAL_TABLET | Freq: Every morning | ORAL | Status: DC
  Administered 2023-10-21 – 2023-10-23 (×3): 40 mg via ORAL
  Filled 2023-10-20: qty 2
  Filled 2023-10-20: qty 4
  Filled 2023-10-20: qty 2

## 2023-10-20 MED ORDER — ALPRAZOLAM 0.25 MG PO TABS: 0.2500 mg | ORAL_TABLET | Freq: Every evening | ORAL | Status: DC | PRN

## 2023-10-20 MED ORDER — SUCRALFATE 1 G PO TABS: 1.0000 g | ORAL_TABLET | Freq: Three times a day (TID) | ORAL | Status: DC

## 2023-10-20 MED ORDER — ACETAMINOPHEN 650 MG RE SUPP: 650.0000 mg | Freq: Four times a day (QID) | RECTAL | Status: DC | PRN

## 2023-10-20 MED ORDER — OXYCODONE HCL 5 MG PO TABS
10.0000 mg | ORAL_TABLET | Freq: Three times a day (TID) | ORAL | Status: DC | PRN
  Administered 2023-10-21 – 2023-10-23 (×7): 10 mg via ORAL
  Filled 2023-10-20 (×7): qty 2

## 2023-10-20 MED ORDER — LINACLOTIDE 145 MCG PO CAPS
290.0000 ug | ORAL_CAPSULE | Freq: Every morning | ORAL | Status: DC
  Administered 2023-10-21 – 2023-10-23 (×3): 290 ug via ORAL
  Filled 2023-10-20 (×2): qty 2
  Filled 2023-10-20: qty 1
  Filled 2023-10-20 (×2): qty 2

## 2023-10-20 MED ORDER — SODIUM CHLORIDE 0.9 % IV BOLUS
1000.0000 mL | Freq: Once | INTRAVENOUS | Status: AC
  Administered 2023-10-20: 1000 mL via INTRAVENOUS

## 2023-10-20 MED ORDER — NITROGLYCERIN 0.4 MG SL SUBL
0.4000 mg | SUBLINGUAL_TABLET | SUBLINGUAL | Status: DC | PRN
Start: 2023-10-20 — End: 2023-10-23

## 2023-10-20 MED ORDER — ONDANSETRON HCL 4 MG/2ML IJ SOLN
4.0000 mg | Freq: Four times a day (QID) | INTRAMUSCULAR | Status: DC | PRN
  Filled 2023-10-20: qty 2

## 2023-10-20 MED ORDER — SUVOREXANT 20 MG PO TABS: 1.0000 | ORAL_TABLET | Freq: Every day | ORAL | Status: DC

## 2023-10-20 MED ORDER — TIZANIDINE HCL 4 MG PO TABS: 4.0000 mg | ORAL_TABLET | Freq: Three times a day (TID) | ORAL | Status: DC | PRN

## 2023-10-20 MED ORDER — MIDODRINE HCL 5 MG PO TABS
10.0000 mg | ORAL_TABLET | Freq: Three times a day (TID) | ORAL | Status: DC
  Administered 2023-10-20 – 2023-10-21 (×2): 10 mg via ORAL
  Filled 2023-10-20 (×3): qty 2

## 2023-10-20 MED ORDER — ENOXAPARIN SODIUM 30 MG/0.3ML IJ SOSY
30.0000 mg | PREFILLED_SYRINGE | INTRAMUSCULAR | Status: DC
  Administered 2023-10-21 – 2023-10-23 (×3): 30 mg via SUBCUTANEOUS
  Filled 2023-10-20 (×3): qty 0.3

## 2023-10-20 MED ORDER — POTASSIUM CHLORIDE CRYS ER 20 MEQ PO TBCR
20.0000 meq | EXTENDED_RELEASE_TABLET | Freq: Every morning | ORAL | Status: DC
  Administered 2023-10-21 – 2023-10-23 (×3): 20 meq via ORAL
  Filled 2023-10-20 (×3): qty 1

## 2023-10-20 MED ORDER — LACTATED RINGERS IV BOLUS
1000.0000 mL | Freq: Once | INTRAVENOUS | Status: AC
  Administered 2023-10-20: 1000 mL via INTRAVENOUS

## 2023-10-20 MED ORDER — MIDODRINE HCL 5 MG PO TABS: 10.0000 mg | ORAL_TABLET | Freq: Three times a day (TID) | ORAL | Status: DC

## 2023-10-20 MED ORDER — PANTOPRAZOLE SODIUM 40 MG PO TBEC
40.0000 mg | DELAYED_RELEASE_TABLET | Freq: Two times a day (BID) | ORAL | Status: DC
  Administered 2023-10-21 – 2023-10-23 (×6): 40 mg via ORAL
  Filled 2023-10-20 (×6): qty 1

## 2023-10-20 MED ORDER — ACETAMINOPHEN 325 MG PO TABS
650.0000 mg | ORAL_TABLET | Freq: Four times a day (QID) | ORAL | Status: DC | PRN
  Administered 2023-10-21: 650 mg via ORAL
  Filled 2023-10-20: qty 2

## 2023-10-20 MED ORDER — CALCIUM CARBONATE 1250 (500 CA) MG PO TABS
1250.0000 mg | ORAL_TABLET | Freq: Every day | ORAL | Status: DC
  Administered 2023-10-21 – 2023-10-23 (×3): 1250 mg via ORAL
  Filled 2023-10-20 (×3): qty 1

## 2023-10-20 MED ORDER — ONDANSETRON HCL 4 MG PO TABS: 4.0000 mg | ORAL_TABLET | Freq: Four times a day (QID) | ORAL | Status: DC | PRN

## 2023-10-20 MED ORDER — LACTATED RINGERS IV SOLN
150.0000 mL/h | INTRAVENOUS | Status: DC
  Administered 2023-10-20: 150 mL/h via INTRAVENOUS

## 2023-10-20 NOTE — H&P (Incomplete)
[place]   PATIENT NAME: Jean Gomez    MR#:  [id number]  DATE OF BIRTH:  1956-02-29  DATE OF ADMISSION:  10/20/2023  PRIMARY CARE PHYSICIAN: Sherron Monday, MD   Patient is coming from: Home  REQUESTING/REFERRING PHYSICIAN: Alfonse Flavors, MD  CHIEF COMPLAINT:  Dizziness and syncope  HISTORY OF PRESENT ILLNESS:  Jean Gomez is a 67 y.o. female with medical history significant for COPD, depression, hypertension, dyslipidemia, osteoarthritis, sarcoidosis, seizure disorder and OSA as well as asthma, who presented to the emergency room with acute onset of dizziness and syncope at home.  The patient was recently admitted here for GI bleeding due to acute gastric ulcer without hemorrhage or perforation with acute blood loss anemia requiring transfusion of 2 units of packed red blood cells.  She has been having occasional chest pain after falling that she attributes to her fall.  She denies any head injuries.  No cough or wheezing or dyspnea.  No abdominal or back pain.  No headache or diplopia.  No paresthesias or focal muscle weakness.  She had vomiting and diarrhea over the past few days.  She admits to urinary frequency and urgency without significant dysuria or hematuria or flank pain.  She admits to tactile fever and chills.  ED Course: When the patient came to the ER, BP was 54/35 with a MAP of 45 and later 71/42 with a MAP of 52 with heart rate of 104 and later 112 respiratory to 11 and later 24 and pulse oximetry of 90% on room air and 98% on 4 L O2 by nasal cannula.  UA came back positive for UTI and BMP revealed a BUN of 26 with creatinine 3.04 up from normal levels 2 weeks ago.  Sodium is 134 and CO2 21 with a blood glucose of 150.  Calcium was 8.4.  High-sensitivity troponin I was 400 twice and lactic acid was 1.7.  CBC showed leukocytosis 15.2 with neutrophilia and anemia close to baseline with microcytosis and thrombocytosis. EKG as reviewed by me : EKG showed  sinus tachycardia with rate 115 with low voltage QRS and prolonged QT interval with QTc of 486 MS Imaging: Portable chest ray showed the following: 1. Slight interval decrease in pulmonary insufflation. 2. Bilateral perihilar and upper lung zone parenchymal scarring. 3. Rounded opacity at the right apex, new from prior examination. Underlying pulmonary nodule is not excluded. Dedicated two view chest radiograph or CT imaging is recommended for further characterization.  Right shoulder x-ray showed the following: 1. Progressive mild to moderate glenohumeral degenerative arthritis. 2. Interval superior subluxation of the right humeral head suggesting underlying rotator cuff degeneration.  The patient was given 2 g of IV Rocephin, 2 L bolus of IV normal saline followed by 250 mL/h.  BP is improved to 108/61 with a MAP of 73.  The patient will be admitted to an observation progressive unit bed for further evaluation and management.   PAST MEDICAL HISTORY:   Past Medical History:  Diagnosis Date   Acute postoperative pain 01/16/2019   Allergy    Asthma    Cerebral aneurysm    Chest pain 11/01/2016   Chronic migraine    Migraine   Chronic pain syndrome    Chronic respiratory failure (HCC)    Constipation    COPD (chronic obstructive pulmonary disease) (HCC)    Depression    major depression in remission   Hypercholesterolemia    Hypertension    Osteoarthritis  Pneumonia    12/2018   Sarcoid    Seizures (HCC)    Sleep apnea     PAST SURGICAL HISTORY:   Past Surgical History:  Procedure Laterality Date   ABDOMINAL HYSTERECTOMY     BACK SURGERY     spine fusion anterior lumbar with open posterior   BIOPSY  07/02/2023   Procedure: BIOPSY;  Surgeon: Regis Bill, MD;  Location: [facility] ENDOSCOPY;  Service: Endoscopy;;   CHOLECYSTECTOMY     COLONOSCOPY WITH PROPOFOL N/A 08/19/2019   Procedure: COLONOSCOPY WITH PROPOFOL;  Surgeon: Christena Deem, MD;  Location: [facility]  ENDOSCOPY;  Service: Endoscopy;  Laterality: N/A;   ESOPHAGOGASTRODUODENOSCOPY (EGD) WITH PROPOFOL N/A 08/19/2019   Procedure: ESOPHAGOGASTRODUODENOSCOPY (EGD) WITH PROPOFOL;  Surgeon: Christena Deem, MD;  Location: [facility] ENDOSCOPY;  Service: Endoscopy;  Laterality: N/A;   ESOPHAGOGASTRODUODENOSCOPY (EGD) WITH PROPOFOL N/A 08/04/2020   Procedure: ESOPHAGOGASTRODUODENOSCOPY (EGD) WITH PROPOFOL;  Surgeon: Earline Mayotte, MD;  Location: [facility] ENDOSCOPY;  Service: Endoscopy;  Laterality: N/A;   ESOPHAGOGASTRODUODENOSCOPY (EGD) WITH PROPOFOL N/A 11/05/2020   Procedure: ESOPHAGOGASTRODUODENOSCOPY (EGD) WITH PROPOFOL;  Surgeon: Earline Mayotte, MD;  Location: [facility] ENDOSCOPY;  Service: Endoscopy;  Laterality: N/A;   ESOPHAGOGASTRODUODENOSCOPY (EGD) WITH PROPOFOL N/A 07/02/2023   Procedure: ESOPHAGOGASTRODUODENOSCOPY (EGD) WITH PROPOFOL;  Surgeon: Regis Bill, MD;  Location: [facility] ENDOSCOPY;  Service: Endoscopy;  Laterality: N/A;   ESOPHAGOGASTRODUODENOSCOPY (EGD) WITH PROPOFOL N/A 10/01/2023   Procedure: ESOPHAGOGASTRODUODENOSCOPY (EGD) WITH PROPOFOL;  Surgeon: Midge Minium, MD;  Location: [facility] ENDOSCOPY;  Service: Endoscopy;  Laterality: N/A;   SPINAL FUSION      SOCIAL HISTORY:   Social History   Tobacco Use   Smoking status: Never   Smokeless tobacco: Never  Substance Use Topics   Alcohol use: Yes    Comment: rarely    FAMILY HISTORY:   Family History  Problem Relation Age of Onset   Dementia Mother    Breast cancer Mother    Throat cancer Father     DRUG ALLERGIES:   Allergies  Allergen Reactions   Penicillins Rash, Hives and Itching   Contrast Media [Iodinated Contrast Media] Swelling   Gabapentin    Labetalol Other (See Comments)    Made hair fall out   Lactose Intolerance (Gi)     Bloating and diarrhea    Sulfabenzamide Nausea Only   Pregabalin Other (See Comments)    The patient complained of hemifacial numbness with the use of the medication.    REVIEW  OF SYSTEMS:   ROS As per history of present illness. All pertinent systems were reviewed above. Constitutional, HEENT, cardiovascular, respiratory, GI, GU, musculoskeletal, neuro, psychiatric, endocrine, integumentary and hematologic systems were reviewed and are otherwise negative/unremarkable except for positive findings mentioned above in the HPI.   MEDICATIONS AT HOME:   Prior to Admission medications   Medication Sig Start Date End Date Taking? Authorizing Provider  albuterol (PROVENTIL) (2.5 MG/3ML) 0.083% nebulizer solution Take 2.5 mg by nebulization every 6 (six) hours as needed for wheezing or shortness of breath.    [provider]  Albuterol Sulfate 108 (90 Base) MCG/ACT AEPB Inhale 2 puffs into the lungs every 6 (six) hours as needed.    [provider]  ALPRAZolam Prudy Feeler) 0.25 MG tablet Take 0.25 mg by mouth at bedtime as needed for sleep.    [provider]  amLODipine-olmesartan (AZOR) 5-40 MG tablet TAKE 1 TABLET BY MOUTH EVERY MORNING 10/08/23   Sherron Monday, MD  aspirin EC 81  MG tablet Take 1 tablet (81 mg total) by mouth daily. 04/19/18   Clapacs, Jackquline Denmark, MD  atorvastatin (LIPITOR) 80 MG tablet TAKE 1 TABLET BY MOUTH NIGHTLY 09/10/23   Sherron Monday, MD  calcium carbonate (OS-CAL) 1250 (500 Ca) MG chewable tablet Chew by mouth daily.    [provider]  citalopram (CELEXA) 40 MG tablet TAKE ONE TABLET BY MOUTH EVERY MORNING 09/18/23   Sherron Monday, MD  dicyclomine (BENTYL) 20 MG tablet Take 1 tablet (20 mg total) by mouth every 6 (six) hours as needed for up to 7 days for spasms. 06/26/23 09/30/23  Trinna Post, MD  diphenhydrAMINE (BENADRYL) 25 MG tablet Take 25 mg by mouth every 6 (six) hours as needed.    [provider]  donepezil (ARICEPT) 10 MG tablet TAKE ONE TABLET BY MOUTH EVERY MORNING 07/30/23   Sherron Monday, MD  Elastic Bandages & Supports (MEDICAL COMPRESSION STOCKINGS) MISC Please provide  compression stockings 12/30/18   Minna Antis, MD  Fluticasone Furoate (ARNUITY ELLIPTA) 100 MCG/ACT AEPB INHALE 1 INHALATION INTO THE LUNGS ONCE DAILY. 09/18/23   Sherron Monday, MD  hydrochlorothiazide (HYDRODIURIL) 25 MG tablet TAKE 1 TABLET BY MOUTH EVERY MORNING 10/08/23   Sherron Monday, MD  lactulose (CHRONULAC) 10 GM/15ML solution Take 10 g by mouth 2 (two) times daily as needed for mild constipation.    [provider]  levETIRAcetam (KEPPRA) 750 MG tablet TAKE ONE TABLET BY MOUTH EVERY MORNING 06/25/23   Sherron Monday, MD  LINZESS 290 MCG CAPS capsule TAKE 1 CAPSULE BY MOUTH EVERY MORNING 07/09/23   Sherron Monday, MD  mirtazapine (REMERON) 15 MG tablet Take 1 tablet (15 mg total) by mouth at bedtime. 09/21/23   Sherron Monday, MD  nitroGLYCERIN (NITROSTAT) 0.4 MG SL tablet Place 0.4 mg under the tongue every 5 (five) minutes as needed for chest pain.    [provider]  Oxycodone HCl 10 MG TABS Take 10 mg by mouth 3 (three) times daily as needed. 10/15/23   [provider]  pantoprazole (PROTONIX) 40 MG tablet Take 1 tablet (40 mg total) by mouth 2 (two) times daily. 10/03/23 11/02/23  Charise Killian, MD  potassium chloride SA (KLOR-CON M) 20 MEQ tablet TAKE ONE TABLET BY MOUTH EVERY MORNING 09/18/23   Sherron Monday, MD  promethazine (PHENERGAN) 25 MG tablet Take 1 tablet (25 mg total) by mouth every 6 (six) hours as needed for nausea or vomiting. 04/30/23 09/30/23  Sherron Monday, MD  sucralfate (CARAFATE) 1 g tablet TAKE ONE TABLET BY MOUTH FOUR TIMES DAILY BEFORE MEALS AND AT BEDTIME 03/20/23   Orson Eva, NP  Suvorexant (BELSOMRA) 20 MG TABS TAKE ONE TABLET BY MOUTH AT BEDTIME 09/18/23   Sherron Monday, MD  tiZANidine (ZANAFLEX) 4 MG tablet Take 1 tablet (4 mg total) by mouth 3 (three) times daily. Patient taking differently: Take 4 mg by mouth 3 (three) times daily as needed. 03/29/20 09/30/23  Delano Metz, MD       VITAL SIGNS:  Blood pressure 118/72, pulse 87, temperature 100.1 F (37.8 C), temperature source Oral, resp. rate 18, height 5\' 3"  (1.6 m), weight 73.6 kg, SpO2 100%.  PHYSICAL EXAMINATION:  Physical Exam  GENERAL:  67 y.o.-year-old patient lying in the bed with no acute distress.  EYES: Pupils equal, round, reactive to light and accommodation. No scleral icterus. Extraocular muscles intact.  HEENT: Head atraumatic, normocephalic. Oropharynx and nasopharynx clear.  NECK:  Supple, no jugular venous distention. No thyroid enlargement, no tenderness.  LUNGS: Normal breath sounds bilaterally, no wheezing, rales,rhonchi or crepitation. No use of accessory muscles of respiration.  CARDIOVASCULAR: Regular rate and rhythm, S1, S2 normal. No murmurs, rubs, or gallops.  ABDOMEN: Soft, nondistended, nontender. Bowel sounds present. No organomegaly or mass.  EXTREMITIES: No pedal edema, cyanosis, or clubbing.  NEUROLOGIC: Cranial nerves II through XII are intact. Muscle strength 5/5 in all extremities. Sensation intact. Gait not checked.  PSYCHIATRIC: The patient is alert and oriented x 3.  Normal affect and good eye contact. SKIN: No obvious rash, lesion, or ulcer.   LABORATORY PANEL:   CBC Recent Labs  Lab 10/20/23 1828  WBC 15.2*  HGB 10.2*  HCT 34.4*  PLT 453*   ------------------------------------------------------------------------------------------------------------------  Chemistries  Recent Labs  Lab 10/20/23 1828  NA 134*  K 3.6  CL 101  CO2 21*  GLUCOSE 150*  BUN 26*  CREATININE 3.04*  CALCIUM 8.4*   ------------------------------------------------------------------------------------------------------------------  Cardiac Enzymes No results for input(s): "TROPONINI" in the last 168 hours. ------------------------------------------------------------------------------------------------------------------  RADIOLOGY:  CT Renal Stone Study  Result Date:  10/20/2023 CLINICAL DATA:  Abdominal and flank pain EXAM: CT ABDOMEN AND PELVIS WITHOUT CONTRAST TECHNIQUE: Multidetector CT imaging of the abdomen and pelvis was performed following the standard protocol without IV contrast. RADIATION DOSE REDUCTION: This exam was performed according to the departmental dose-optimization program which includes automated exposure control, adjustment of the mA and/or kV according to patient size and/or use of iterative reconstruction technique. COMPARISON:  09/29/2023 FINDINGS: Lower chest: Cylindrical bronchiectasis and volume loss within the lower lobes bilaterally, right greater than left, is again noted no acute bone abnormality. No acute abnormality. Hepatobiliary: Cholecystectomy has been performed. Mild extrahepatic biliary ductal dilation appears stable since prior examination, possibly representing post cholecystectomy change. There is, however, heterogeneity of the intraluminal contents within the distal common duct, best seen on image # 32/2 and intraluminal debris, clot, or calculi is not excluded. No intrahepatic biliary ductal dilation. The liver is otherwise unremarkable on this noncontrast examination. Pancreas: Unremarkable Spleen: Unremarkable Adrenals/Urinary Tract: Adrenal glands are unremarkable. Kidneys are normal, without renal calculi, focal lesion, or hydronephrosis. Bladder is unremarkable. Stomach/Bowel: Stomach is within normal limits. Appendix appears normal. No evidence of bowel wall thickening, distention, or inflammatory changes. Vascular/Lymphatic: Aortic atherosclerosis. No enlarged abdominal or pelvic lymph nodes. Reproductive: Status post hysterectomy. No adnexal masses. Other: No abdominal wall hernia or abnormality. No abdominopelvic ascites. Musculoskeletal: Degenerative changes are seen within the lumbar spine with stable grade 1 anterolisthesis L5-S1. No acute bone abnormality. No lytic or blastic bone lesion. IMPRESSION: 1. No definite acute  intra-abdominal pathology identified. 2. Mild extrahepatic biliary ductal dilation appears stable since prior examination, possibly representing post cholecystectomy change. Heterogeneity of the intraluminal contents within the distal common duct, possibly representing intraluminal debris, clot, or calculi. Correlation with liver function tests is recommended. If indicated, this could be better assessed with MRCP. 3. Cylindrical bronchiectasis and volume loss within the lower lobes bilaterally, right greater than left, unchanged, and in keeping with chronic changes of the patient's known sarcoidosis, better assessed on chest CT of 02/11/2018. Aortic Atherosclerosis (ICD10-I70.0). Electronically Signed   By: Helyn Numbers M.D.   On: 10/20/2023 21:49   DG Shoulder Right  Result Date: 10/20/2023 CLINICAL DATA:  Right shoulder pain EXAM: RIGHT SHOULDER - 2+ VIEW COMPARISON:  02/07/2018 FINDINGS: Interval superior subluxation of the right humeral head suggesting underlying rotator cuff degeneration. Progressive asymmetric joint  space narrowing and osteophyte formation in keeping with mild to moderate glenohumeral degenerative arthritis. Acromioclavicular joint space is preserved. No acute fracture or frank dislocation. Visualized right hemithorax is unremarkable. IMPRESSION: 1. Progressive mild to moderate glenohumeral degenerative arthritis. 2. Interval superior subluxation of the right humeral head suggesting underlying rotator cuff degeneration. Electronically Signed   By: Helyn Numbers M.D.   On: 10/20/2023 21:37   DG Chest Portable 1 View  Result Date: 10/20/2023 CLINICAL DATA:  Syncope EXAM: PORTABLE CHEST 1 VIEW COMPARISON:  None Available. FINDINGS: Lung volumes are symmetric though pulmonary insufflation has slightly diminished since prior examination. Bilateral perihilar and upper lung zone parenchymal scarring is again identified with superior retraction of the hila bilaterally. Rounded opacity at  the right apex is new from prior examination and is indeterminate. Un underlying pulmonary nodule is not excluded. No superimposed confluent pulmonary infiltrate. No pneumothorax or pleural effusion. Cardiac size within normal limits. No acute bone abnormality. IMPRESSION: 1. Slight interval decrease in pulmonary insufflation. 2. Bilateral perihilar and upper lung zone parenchymal scarring. 3. Rounded opacity at the right apex, new from prior examination. Underlying pulmonary nodule is not excluded. Dedicated two view chest radiograph or CT imaging is recommended for further characterization. Electronically Signed   By: Helyn Numbers M.D.   On: 10/20/2023 18:57      IMPRESSION AND PLAN:  Assessment and Plan: * Sepsis due to gram-negative UTI (HCC) - Sepsis manifested by tachycardia, tachypnea, and leukocytosis. - This could be partly related to stercoral colitis in addition to UTI. - The patient meets severe sepsis criteria given her hypotension. - She will be admitted to a progressive unit observation bed. - We will continue antibiotic therapy with IV Rocephin. - Will follow urine and blood cultures. - We will continue hydration with IV lactated ringer. - We will manage her constipation that is associated with her stercoral colitis.  Syncope - This is likely secondary to hypotension associated with her sepsis. - Management as above. - We will check her orthostatics.  AKI (acute kidney injury) (HCC) - This is likely prerenal due to hypovolemia and hypotension. - We will continue hydration as mentioned above. - Will follow BMP. - We will avoid nephrotoxins.  COPD (chronic obstructive pulmonary disease) (HCC) - We will continue her inhalers.  Dementia, presenile with depression (HCC) - This is associated with anxiety. - We will continue Aricept and Remeron as well as Celexa and Xanax.  Peptic ulcer disease - The patient still Hemoccult came back negative. - We will continue PPI  therapy as well as Carafate given her recent acute gastric ulcer.  Essential hypertension - Oral antihypertensives are being held off given her initial hypotension.  Dyslipidemia - We will continue statin therapy.  Seizure disorder (HCC) - Will continue her Keppra.     DVT prophylaxis: Lovenox. Advanced Care Planning:  Code Status: full code. Family Communication:  The plan of care was discussed in details with the patient (and family). I answered all questions. The patient agreed to proceed with the above mentioned plan. Further management will depend upon hospital course. Disposition Plan: Back to previous home environment Consults called: none. All the records are reviewed and case discussed with ED provider.  Status is: Observation  I certify that at the time of admission, it is my clinical judgment that the patient will require hospital care extending less than 2 midnights.  Dispo: The patient is from: Home              Anticipated d/c is to: Home              Patient currently is not medically stable to d/c.              Difficult to place patient: No  Hannah Beat M.D on 10/21/2023 at 1:53 AM  [facility]   From 7 PM-7 AM, contact night-coverage [email or web address]  CC: Primary care physician; Sherron Monday, MD

## 2023-10-20 NOTE — Assessment & Plan Note (Signed)
-   Oral antihypertensives are being held off given her initial hypotension.

## 2023-10-20 NOTE — Assessment & Plan Note (Signed)
-   This is associated with anxiety. - We will continue Aricept and Remeron as well as Celexa and Xanax.

## 2023-10-20 NOTE — Progress Notes (Signed)
PHARMACIST - PHYSICIAN COMMUNICATION  CONCERNING:  Enoxaparin (Lovenox) for DVT Prophylaxis    RECOMMENDATION: Patient was prescribed enoxaprin 40mg  q24 hours for VTE prophylaxis.   Filed Weights   10/20/23 1925  Weight: 73.6 kg (162 lb 3.2 oz)    Body mass index is 28.73 kg/m.  Estimated Creatinine Clearance: 17.3 mL/min (A) (by C-G formula based on SCr of 3.04 mg/dL (H)).  Patient is candidate for enoxaparin 30mg  every 24 hours based on CrCl <50ml/min or Weight <45kg  DESCRIPTION: Pharmacy has adjusted enoxaparin dose per [facility] policy.  Patient is now receiving enoxaparin 30 mg every 24 hours   Otelia Sergeant, PharmD, [facility] 10/20/2023 10:16 PM

## 2023-10-20 NOTE — ED Notes (Signed)
Nurse notified of pt destating

## 2023-10-20 NOTE — Assessment & Plan Note (Signed)
-   This is likely prerenal due to hypovolemia and hypotension. - We will continue hydration as mentioned above. - Will follow BMP. - We will avoid nephrotoxins.

## 2023-10-20 NOTE — Assessment & Plan Note (Addendum)
-   We will continue statin therapy. 

## 2023-10-20 NOTE — Assessment & Plan Note (Signed)
-   This is likely secondary to hypotension associated with her sepsis. - Management as above. - We will check her orthostatics.

## 2023-10-20 NOTE — Assessment & Plan Note (Signed)
-   The patient still Hemoccult came back negative. - We will continue PPI therapy as well as Carafate given her recent acute gastric ulcer.

## 2023-10-20 NOTE — Assessment & Plan Note (Signed)
-   We will continue her inhalers. 

## 2023-10-20 NOTE — ED Provider Notes (Signed)
[facility] Provider Note    Event Date/Time   First MD Initiated Contact with Patient 10/20/23 1818     (approximate)   History   Chief Complaint: Dizziness  HPI  Jean Gomez is a 67 y.o. female with a history of hypertension, COPD on nasal cannula oxygen at home, seizures, GI bleed who comes the ED complaining of dizziness and syncope at home.  Feels lightheaded currently.  Denies chest pain or shortness of breath, no abdominal pain or back pain.  No headaches or vision changes.  Does report having vomiting and diarrhea for the past few days.     Physical Exam   Triage Vital Signs: ED Triage Vitals  Encounter Vitals Group     BP 10/20/23 1820 (!) 141/111     Systolic BP Percentile --      Diastolic BP Percentile --      Pulse Rate 10/20/23 1824 (!) 104     Resp 10/20/23 1824 11     Temp --      Temp src --      SpO2 10/20/23 1824 90 %     Weight --      Height --      Head Circumference --      Peak Flow --      Pain Score --      Pain Loc --      Pain Education --      Exclude from Growth Chart --     Most recent vital signs: Vitals:   10/20/23 2045 10/20/23 2057  BP: 109/67   Pulse: 98 93  Resp: (!) 23 15  Temp:    SpO2: 100% 100%    General: Awake, no distress.  Nontoxic appearance.  Dry oral mucosa CV:  Good peripheral perfusion.  Tachycardia, heart rate 110 Resp:  Normal effort.  Clear to auscultation bilaterally Abd:  No distention.  Soft nontender.  Rectal exam completed with nurse Jae Dire at bedside.  Watery Zaborowski stool, Hemoccult negative Other:  Mild conjunctival pallor.   ED Results / Procedures / Treatments   Labs (all labs ordered are listed, but only abnormal results are displayed) Labs Reviewed  CBC WITH DIFFERENTIAL/PLATELET - Abnormal; Notable for the following components:      Result Value   WBC 15.2 (*)    Hemoglobin 10.2 (*)    HCT 34.4 (*)    MCH 24.8 (*)    MCHC 29.7 (*)    RDW 19.3 (*)     Platelets 453 (*)    Neutro Abs 12.5 (*)    Abs Immature Granulocytes 0.08 (*)    All other components within normal limits  BASIC METABOLIC PANEL - Abnormal; Notable for the following components:   Sodium 134 (*)    CO2 21 (*)    Glucose, Bld 150 (*)    BUN 26 (*)    Creatinine, Ser 3.04 (*)    Calcium 8.4 (*)    GFR, Estimated 16 (*)    All other components within normal limits  URINALYSIS, W/ REFLEX TO CULTURE (INFECTION SUSPECTED) - Abnormal; Notable for the following components:   Color, Urine YELLOW (*)    APPearance CLOUDY (*)    Glucose, UA 50 (*)    Hgb urine dipstick SMALL (*)    Protein, ur 30 (*)    Leukocytes,Ua LARGE (*)    Bacteria, UA RARE (*)    Non Squamous Epithelial PRESENT (*)    All  other components within normal limits  CULTURE, BLOOD (ROUTINE X 2)  CULTURE, BLOOD (ROUTINE X 2)  LACTIC ACID, PLASMA  TROPONIN I (HIGH SENSITIVITY)  TROPONIN I (HIGH SENSITIVITY)     EKG Interpreted by me Sinus tachycardia rate 115.  Normal axis, normal intervals.  Normal QRS ST segments and T waves   RADIOLOGY Chest x-ray interpreted by me, no consolidation.  Radiology report reviewed   PROCEDURES:  .Critical Care  Performed by: Sharman Cheek, MD Authorized by: Sharman Cheek, MD   Critical care provider statement:    Critical care time (minutes):  35   Critical care time was exclusive of:  Separately billable procedures and treating other patients   Critical care was necessary to treat or prevent imminent or life-threatening deterioration of the following conditions:  Sepsis and shock   Critical care was time spent personally by me on the following activities:  Development of treatment plan with patient or surrogate, discussions with consultants, evaluation of patient's response to treatment, examination of patient, obtaining history from patient or surrogate, ordering and performing treatments and interventions, ordering and review of laboratory studies,  ordering and review of radiographic studies, pulse oximetry, re-evaluation of patient's condition and review of old charts   Care discussed with: admitting provider      MEDICATIONS ORDERED IN ED: Medications  cefTRIAXone (ROCEPHIN) 2 g in sodium chloride 0.9 % 100 mL IVPB (0 g Intravenous Stopped 10/20/23 2001)  lactated ringers bolus 1,000 mL (has no administration in time range)  sodium chloride 0.9 % bolus 1,000 mL (0 mLs Intravenous Stopped 10/20/23 2019)  sodium chloride 0.9 % bolus 1,000 mL (0 mLs Intravenous Stopped 10/20/23 2020)  sodium chloride 0.9 % bolus 250 mL (0 mLs Intravenous Stopped 10/20/23 2052)     IMPRESSION / MDM / ASSESSMENT AND PLAN / ED COURSE  I reviewed the triage vital signs and the nursing notes.  DDx: Dehydration, AKI, electrolyte abnormality, anemia, UTI, pneumonia, sepsis.  Doubt ACS PE dissection ruptured aneurysm.  Patient's presentation is most consistent with acute presentation with potential threat to life or bodily function.  Patient presents with lightheadedness, syncope, presents with hypotension and tachycardia concerning for shock, possibly sepsis.  Will obtain labs, cultures, give IV fluid bolus 30 mL/kg, 2250 mL total.   Clinical Course as of 10/20/23 2140  Sat Oct 20, 2023  2106 Sepsis reassessment completed. After IVF bolus, BP is normalized. Vasopressors not indicated.  [PS]    Clinical Course User Index [PS] Sharman Cheek, MD    ----------------------------------------- 9:40 PM on 10/20/2023 ----------------------------------------- Urinalysis confirms cystitis as source of sepsis.  Chemistry panel reveals AKI.  CT abdomen pelvis negative for obstructing ureterolithiasis or hydronephrosis.  Case discussed with hospitalist for further management.   FINAL CLINICAL IMPRESSION(S) / ED DIAGNOSES   Final diagnoses:  Sepsis, due to unspecified organism, unspecified whether acute organ dysfunction present (HCC)  AKI (acute kidney  injury) (HCC)  Cystitis     Rx / DC Orders   ED Discharge Orders     None        Note:  This document was prepared using Dragon voice recognition software and may include unintentional dictation errors.   Sharman Cheek, MD 10/20/23 2141

## 2023-10-20 NOTE — Assessment & Plan Note (Signed)
-   Will continue her Keppra.

## 2023-10-20 NOTE — Assessment & Plan Note (Addendum)
-   Sepsis manifested by tachycardia, tachypnea, and leukocytosis. - This could be partly related to stercoral colitis in addition to UTI. - The patient meets severe sepsis criteria given her hypotension. - She will be admitted to a progressive unit observation bed. - We will continue antibiotic therapy with IV Rocephin. - Will follow urine and blood cultures. - We will continue hydration with IV lactated ringer. - We will manage her constipation that is associated with her stercoral colitis.

## 2023-10-21 DIAGNOSIS — N3090 Cystitis, unspecified without hematuria: Secondary | ICD-10-CM | POA: Diagnosis present

## 2023-10-21 DIAGNOSIS — R55 Syncope and collapse: Secondary | ICD-10-CM

## 2023-10-21 DIAGNOSIS — E861 Hypovolemia: Secondary | ICD-10-CM | POA: Diagnosis present

## 2023-10-21 DIAGNOSIS — G40909 Epilepsy, unspecified, not intractable, without status epilepticus: Secondary | ICD-10-CM

## 2023-10-21 DIAGNOSIS — A4150 Gram-negative sepsis, unspecified: Secondary | ICD-10-CM

## 2023-10-21 DIAGNOSIS — E785 Hyperlipidemia, unspecified: Secondary | ICD-10-CM

## 2023-10-21 DIAGNOSIS — K279 Peptic ulcer, site unspecified, unspecified as acute or chronic, without hemorrhage or perforation: Secondary | ICD-10-CM | POA: Diagnosis not present

## 2023-10-21 DIAGNOSIS — I10 Essential (primary) hypertension: Secondary | ICD-10-CM | POA: Diagnosis present

## 2023-10-21 DIAGNOSIS — R6520 Severe sepsis without septic shock: Secondary | ICD-10-CM | POA: Diagnosis present

## 2023-10-21 DIAGNOSIS — E739 Lactose intolerance, unspecified: Secondary | ICD-10-CM | POA: Diagnosis present

## 2023-10-21 DIAGNOSIS — D75839 Thrombocytosis, unspecified: Secondary | ICD-10-CM | POA: Diagnosis present

## 2023-10-21 DIAGNOSIS — Z91041 Radiographic dye allergy status: Secondary | ICD-10-CM | POA: Diagnosis not present

## 2023-10-21 DIAGNOSIS — F0394 Unspecified dementia, unspecified severity, with anxiety: Secondary | ICD-10-CM | POA: Diagnosis present

## 2023-10-21 DIAGNOSIS — A419 Sepsis, unspecified organism: Secondary | ICD-10-CM

## 2023-10-21 DIAGNOSIS — F0393 Unspecified dementia, unspecified severity, with mood disturbance: Secondary | ICD-10-CM

## 2023-10-21 DIAGNOSIS — Z880 Allergy status to penicillin: Secondary | ICD-10-CM | POA: Diagnosis not present

## 2023-10-21 DIAGNOSIS — J449 Chronic obstructive pulmonary disease, unspecified: Secondary | ICD-10-CM

## 2023-10-21 DIAGNOSIS — N390 Urinary tract infection, site not specified: Secondary | ICD-10-CM | POA: Diagnosis not present

## 2023-10-21 DIAGNOSIS — J4489 Other specified chronic obstructive pulmonary disease: Secondary | ICD-10-CM | POA: Diagnosis present

## 2023-10-21 DIAGNOSIS — Z9981 Dependence on supplemental oxygen: Secondary | ICD-10-CM | POA: Diagnosis not present

## 2023-10-21 DIAGNOSIS — E876 Hypokalemia: Secondary | ICD-10-CM | POA: Diagnosis present

## 2023-10-21 DIAGNOSIS — N179 Acute kidney failure, unspecified: Secondary | ICD-10-CM

## 2023-10-21 DIAGNOSIS — Z90710 Acquired absence of both cervix and uterus: Secondary | ICD-10-CM | POA: Diagnosis not present

## 2023-10-21 DIAGNOSIS — F325 Major depressive disorder, single episode, in full remission: Secondary | ICD-10-CM | POA: Diagnosis present

## 2023-10-21 DIAGNOSIS — Z8711 Personal history of peptic ulcer disease: Secondary | ICD-10-CM | POA: Diagnosis not present

## 2023-10-21 DIAGNOSIS — M19011 Primary osteoarthritis, right shoulder: Secondary | ICD-10-CM | POA: Diagnosis present

## 2023-10-21 DIAGNOSIS — G894 Chronic pain syndrome: Secondary | ICD-10-CM | POA: Diagnosis present

## 2023-10-21 DIAGNOSIS — D649 Anemia, unspecified: Secondary | ICD-10-CM | POA: Diagnosis present

## 2023-10-21 DIAGNOSIS — E7800 Pure hypercholesterolemia, unspecified: Secondary | ICD-10-CM | POA: Diagnosis present

## 2023-10-21 DIAGNOSIS — Z888 Allergy status to other drugs, medicaments and biological substances status: Secondary | ICD-10-CM | POA: Diagnosis not present

## 2023-10-21 LAB — CBC
HCT: 29.6 % — ABNORMAL LOW (ref 36.0–46.0)
Hemoglobin: 9 g/dL — ABNORMAL LOW (ref 12.0–15.0)
MCH: 24.8 pg — ABNORMAL LOW (ref 26.0–34.0)
MCHC: 30.4 g/dL (ref 30.0–36.0)
MCV: 81.5 fL (ref 80.0–100.0)
Platelets: 435 10*3/uL — ABNORMAL HIGH (ref 150–400)
RBC: 3.63 MIL/uL — ABNORMAL LOW (ref 3.87–5.11)
RDW: 18.7 % — ABNORMAL HIGH (ref 11.5–15.5)
WBC: 15.8 10*3/uL — ABNORMAL HIGH (ref 4.0–10.5)
nRBC: 0 % (ref 0.0–0.2)

## 2023-10-21 LAB — BASIC METABOLIC PANEL
Anion gap: 7 (ref 5–15)
BUN: 21 mg/dL (ref 8–23)
CO2: 26 mmol/L (ref 22–32)
Calcium: 8.4 mg/dL — ABNORMAL LOW (ref 8.9–10.3)
Chloride: 109 mmol/L (ref 98–111)
Creatinine, Ser: 1.98 mg/dL — ABNORMAL HIGH (ref 0.44–1.00)
GFR, Estimated: 27 mL/min — ABNORMAL LOW (ref >60)
Glucose, Bld: 85 mg/dL (ref 70–99)
Potassium: 3.3 mmol/L — ABNORMAL LOW (ref 3.5–5.1)
Sodium: 141 mmol/L (ref 135–145)

## 2023-10-21 LAB — PROTIME-INR
INR: 1.1 (ref 0.8–1.2)
Prothrombin Time: 14.7 s (ref 11.4–15.2)

## 2023-10-21 LAB — CORTISOL-AM, BLOOD: Cortisol - AM: 7.7 ug/dL (ref 6.7–22.6)

## 2023-10-21 LAB — PROCALCITONIN: Procalcitonin: 0.19 ng/mL

## 2023-10-21 MED ORDER — FLEET ENEMA RE ENEM: 1.0000 | ENEMA | Freq: Every day | RECTAL | Status: DC | PRN

## 2023-10-21 MED ORDER — LACTATED RINGERS IV SOLN: INTRAVENOUS | Status: AC

## 2023-10-21 MED ORDER — BISACODYL 5 MG PO TBEC: 5.0000 mg | DELAYED_RELEASE_TABLET | Freq: Every day | ORAL | Status: DC | PRN

## 2023-10-21 NOTE — ED Notes (Signed)
Pt bed pan emptied, NAD

## 2023-10-21 NOTE — Progress Notes (Signed)
Progress Note   Patient: Jean Gomez VQQ:[id number] DOB: 16-Jan-1956 DOA: 10/20/2023     0 DOS: the patient was seen and examined on 10/21/2023   Brief hospital course: Jean Gomez is a 67 y.o. female with medical history significant for COPD, depression, hypertension, dyslipidemia, osteoarthritis, sarcoidosis, seizure disorder and OSA as well as asthma, who presented to the emergency room with acute onset of dizziness and syncope at home.  Patient's blood pressure found to be very low, UA remarkable, creatinine 3 times higher than normal, admitted to hospitalist service for further management evaluation of sepsis, UTI, syncope, AKI.  Assessment and Plan: * Severe sepsis due to gram-negative UTI with kidney injury, She presented with tachycardia, tachypnea, and leukocytosis, low BP Likely source UTI. BP improved with fluids. Continue antibiotic therapy with IV Rocephin. Follow urine and blood cultures. Gentle IV hydration with IV lactated ringer, rate decreased to 58ml/hr.  Syncope secondary to hypotension associated with her sepsis. Gentle IV fluids. Check orthostatics.  AKI (acute kidney injury) (HCC) Prerenal due to hypovolemia and hypotension. Continue hydration as mentioned above. Monitor daily renal function. Avoid nephrotoxic drugs.  COPD (chronic obstructive pulmonary disease) (HCC) No exacerbation. Continue her inhalers.  Dementia, presenile with depression (HCC) Continue Aricept and Remeron as well as Celexa and Xanax.  Peptic ulcer disease Continue PPI therapy as well as Carafate given her recent acute gastric ulcer.  Essential hypertension Hold oral antihypertensives due to her low BP. BP improved with fluids, stop midodrine.  Dyslipidemia Continue statin therapy.  Seizure disorder (HCC) On Keppra.    Out of bed to chair. Incentive spirometry. Nursing supportive care. Fall, aspiration precautions. DVT prophylaxis   Code Status: Full  Code  Subjective: Patient is seen and examined today morning. She is lying comfortably. Did not get out of bed. Eating poor. Had dizziness episodes while getting out of bed. States appetite poor, lost weight.  Physical Exam: Vitals:   10/21/23 0805 10/21/23 0840 10/21/23 0900 10/21/23 1130  BP: 131/78  124/76 108/67  Pulse:   90 87  Resp:   17 16  Temp:  98.8 F (37.1 C)    TempSrc:  Oral    SpO2:   100% 100%  Weight:      Height:        General - Elderly African American female, no apparent distress HEENT - PERRLA, EOMI, atraumatic head, non tender sinuses. Lung - Clear, no rales, rhonchi, wheezes. Heart - S1, S2 heard, no murmurs, rubs, trace pedal edema. Abdomen - Soft, non tender, bowel sounds good Neuro - Alert, awake and oriented x 3, non focal exam. Skin - Warm and dry.  Data Reviewed:      Latest Ref Rng & Units 10/21/2023    4:50 AM 10/20/2023    6:28 PM 10/16/2023   11:54 AM  CBC  WBC 4.0 - 10.5 K/uL 15.8  15.2  10.1   Hemoglobin 12.0 - 15.0 g/dL 9.0  43.3  29.5   Hematocrit 36.0 - 46.0 % 29.6  34.4  34.6   Platelets 150 - 400 K/uL 435  453  652       Latest Ref Rng & Units 10/21/2023    4:50 AM 10/20/2023    6:28 PM 10/02/2023    5:47 AM  BMP  Glucose 70 - 99 mg/dL 85  188  73   BUN 8 - 23 mg/dL 21  26  10    Creatinine 0.44 - 1.00 mg/dL 4.16  6.06  3.01  Sodium 135 - 145 mmol/L 141  134  138   Potassium 3.5 - 5.1 mmol/L 3.3  3.6  3.8   Chloride 98 - 111 mmol/L 109  101  104   CO2 22 - 32 mmol/L 26  21  24    Calcium 8.9 - 10.3 mg/dL 8.4  8.4  8.2    CT Renal Stone Study  Result Date: 10/20/2023 CLINICAL DATA:  Abdominal and flank pain EXAM: CT ABDOMEN AND PELVIS WITHOUT CONTRAST TECHNIQUE: Multidetector CT imaging of the abdomen and pelvis was performed following the standard protocol without IV contrast. RADIATION DOSE REDUCTION: This exam was performed according to the departmental dose-optimization program which includes automated exposure control,  adjustment of the mA and/or kV according to patient size and/or use of iterative reconstruction technique. COMPARISON:  09/29/2023 FINDINGS: Lower chest: Cylindrical bronchiectasis and volume loss within the lower lobes bilaterally, right greater than left, is again noted no acute bone abnormality. No acute abnormality. Hepatobiliary: Cholecystectomy has been performed. Mild extrahepatic biliary ductal dilation appears stable since prior examination, possibly representing post cholecystectomy change. There is, however, heterogeneity of the intraluminal contents within the distal common duct, best seen on image # 32/2 and intraluminal debris, clot, or calculi is not excluded. No intrahepatic biliary ductal dilation. The liver is otherwise unremarkable on this noncontrast examination. Pancreas: Unremarkable Spleen: Unremarkable Adrenals/Urinary Tract: Adrenal glands are unremarkable. Kidneys are normal, without renal calculi, focal lesion, or hydronephrosis. Bladder is unremarkable. Stomach/Bowel: Stomach is within normal limits. Appendix appears normal. No evidence of bowel wall thickening, distention, or inflammatory changes. Vascular/Lymphatic: Aortic atherosclerosis. No enlarged abdominal or pelvic lymph nodes. Reproductive: Status post hysterectomy. No adnexal masses. Other: No abdominal wall hernia or abnormality. No abdominopelvic ascites. Musculoskeletal: Degenerative changes are seen within the lumbar spine with stable grade 1 anterolisthesis L5-S1. No acute bone abnormality. No lytic or blastic bone lesion. IMPRESSION: 1. No definite acute intra-abdominal pathology identified. 2. Mild extrahepatic biliary ductal dilation appears stable since prior examination, possibly representing post cholecystectomy change. Heterogeneity of the intraluminal contents within the distal common duct, possibly representing intraluminal debris, clot, or calculi. Correlation with liver function tests is recommended. If  indicated, this could be better assessed with MRCP. 3. Cylindrical bronchiectasis and volume loss within the lower lobes bilaterally, right greater than left, unchanged, and in keeping with chronic changes of the patient's known sarcoidosis, better assessed on chest CT of 02/11/2018. Aortic Atherosclerosis (ICD10-I70.0). Electronically Signed   By: Helyn Numbers M.D.   On: 10/20/2023 21:49   DG Shoulder Right  Result Date: 10/20/2023 CLINICAL DATA:  Right shoulder pain EXAM: RIGHT SHOULDER - 2+ VIEW COMPARISON:  02/07/2018 FINDINGS: Interval superior subluxation of the right humeral head suggesting underlying rotator cuff degeneration. Progressive asymmetric joint space narrowing and osteophyte formation in keeping with mild to moderate glenohumeral degenerative arthritis. Acromioclavicular joint space is preserved. No acute fracture or frank dislocation. Visualized right hemithorax is unremarkable. IMPRESSION: 1. Progressive mild to moderate glenohumeral degenerative arthritis. 2. Interval superior subluxation of the right humeral head suggesting underlying rotator cuff degeneration. Electronically Signed   By: Helyn Numbers M.D.   On: 10/20/2023 21:37   DG Chest Portable 1 View  Result Date: 10/20/2023 CLINICAL DATA:  Syncope EXAM: PORTABLE CHEST 1 VIEW COMPARISON:  None Available. FINDINGS: Lung volumes are symmetric though pulmonary insufflation has slightly diminished since prior examination. Bilateral perihilar and upper lung zone parenchymal scarring is again identified with superior retraction of the hila bilaterally. Rounded opacity at the right  apex is new from prior examination and is indeterminate. Un underlying pulmonary nodule is not excluded. No superimposed confluent pulmonary infiltrate. No pneumothorax or pleural effusion. Cardiac size within normal limits. No acute bone abnormality. IMPRESSION: 1. Slight interval decrease in pulmonary insufflation. 2. Bilateral perihilar and upper lung  zone parenchymal scarring. 3. Rounded opacity at the right apex, new from prior examination. Underlying pulmonary nodule is not excluded. Dedicated two view chest radiograph or CT imaging is recommended for further characterization. Electronically Signed   By: Helyn Numbers M.D.   On: 10/20/2023 18:57     Family Communication: Discussed with patient, she understand and agree. All questions answereed.    Disposition: Status is: Inpatient Changed to full admission due to AKI, UTI, syncope work up.  Planned Discharge Destination: Home with Home Health     Time spent: 40 minutes  Author: Marcelino Duster, MD 10/21/2023 12:36 PM Secure chat 7am to 7pm For on call review [email or web address].

## 2023-10-21 NOTE — ED Notes (Signed)
Assisted pt to bedside chair. Gate was steady. Full linen change and urinal emptied. Warm blankets given, brief applied. Call light within reach and bed in lowest position. NAD

## 2023-10-21 NOTE — ED Notes (Signed)
Emptied pt bed pan and provided pt with new towel. NAD noted. Call light within reach. Bed in lowest position

## 2023-10-21 NOTE — ED Notes (Signed)
Attending at bedside.

## 2023-10-22 ENCOUNTER — Encounter: Payer: Self-pay | Admitting: Internal Medicine

## 2023-10-22 ENCOUNTER — Other Ambulatory Visit: Payer: Self-pay

## 2023-10-22 DIAGNOSIS — A419 Sepsis, unspecified organism: Secondary | ICD-10-CM | POA: Diagnosis not present

## 2023-10-22 DIAGNOSIS — A4150 Gram-negative sepsis, unspecified: Secondary | ICD-10-CM | POA: Diagnosis not present

## 2023-10-22 DIAGNOSIS — R55 Syncope and collapse: Secondary | ICD-10-CM | POA: Diagnosis not present

## 2023-10-22 DIAGNOSIS — N179 Acute kidney failure, unspecified: Secondary | ICD-10-CM | POA: Diagnosis not present

## 2023-10-22 NOTE — TOC Initial Note (Addendum)
Transition of Care [facility]) - Initial/Assessment Note    Patient Details  Name: Jean Gomez MRN: [id number] Date of Birth: [birth year]  [facility]) CM/SW Contact:    Darolyn Rua, LCSW Phone Number: 10/22/2023, 2:37 PM  Clinical Narrative:                  Patient presents to ED from home alone, with complaints of dizziness and syncope at home.  Patient's daughter provides transportation to the doctor and will transport her at DC  She has DME at home has [employer] at home  Please consult TOC should discharge planning needs arise.   Expected Discharge Plan: Home/Self Care Barriers to Discharge: Continued Medical Work up   Patient Goals and CMS Choice Patient states their goals for this hospitalization and ongoing recovery are:: t go home          Expected Discharge Plan and Services                                              Prior Living Arrangements/Services                       Activities of Daily Living   ADL Screening (condition at time of admission) Independently performs ADLs?: Yes (appropriate for developmental age) Is the patient deaf or have difficulty hearing?: No Does the patient have difficulty seeing, even when wearing glasses/contacts?: No Does the patient have difficulty concentrating, remembering, or making decisions?: No  Permission Sought/Granted                  Emotional Assessment              Admission diagnosis:  Syncope [R55] Sepsis due to gram-negative UTI (HCC) [A41.50, N39.0] AKI (acute kidney injury) (HCC) [N17.9] Patient Active Problem List   Diagnosis Date Noted   Syncope 10/20/2023   Sepsis due to gram-negative UTI (HCC) 10/20/2023   Dyslipidemia 10/20/2023   Essential hypertension 10/20/2023   Peptic ulcer disease 10/20/2023   Dementia, presenile with depression (HCC) 10/20/2023   Acute blood loss anemia 10/16/2023   Dehydration 10/16/2023   Acute gastric ulcer  without hemorrhage or perforation 10/01/2023   Acute GI bleeding 09/29/2023   Generalized weakness 09/29/2023   Diarrhea 08/30/2023   Sepsis (HCC) 08/30/2023   Multiple gastric ulcers 08/30/2023   AKI (acute kidney injury) (HCC) 08/30/2023   Anemia of chronic disease 08/30/2023   Other spondylosis, sacral and sacrococcygeal region 07/01/2020   History of Allergy to iodine 01/27/2020    Class: History of   Lumbosacral spondylosis with radiculopathy (Right) 01/05/2020   Neurogenic pain 10/29/2019   Osteoarthritis of hip (Right) 10/09/2019   Greater trochanteric bursitis (Right) 10/09/2019   Lumbosacral spinal stenosis 10/09/2019   Lumbosacral (intervertebral disc displacement) (IVDD) 10/09/2019   Lumbar nerve root impingement (Left: L4) (Right: L5) 10/09/2019   Grade 1 Anterolisthesis of L5/S1 10/09/2019   Lumbar facet arthropathy (L3-4, L4-5, L5-S1) 10/09/2019   Lumbosacral facet joint synovial cyst (Right: L5-S1) 10/09/2019   Lumbar foraminal stenosis (Bilateral: L2-S1) 07/10/2019   Abnormal MRI, lumbar spine (07/02/2019) 07/03/2019   Disturbance of skin sensation 06/16/2019   Chronic low back pain (Secondary Area of Pain) (Bilateral) (R>L) w/o sciatica 01/16/2019   Acute on chronic respiratory failure with hypoxia (HCC) 12/18/2018   Cervicogenic headache 10/21/2018  Lumbar spondylosis 06/25/2018   History of allergy to radiographic contrast media 05/30/2018   Bipolar affective disorder, current episode manic (HCC) 04/17/2018   Chronic hip pain (Tertiary Area of Pain) (Bilateral) (R>L) 02/26/2018   DDD (degenerative disc disease), cervical 02/26/2018   Cervical facet arthropathy (Bilateral) 02/26/2018   Cervical facet syndrome (Bilateral) (R>L) 02/26/2018   Cervical Grade 1 Anterolisthesis of C4 over C5 (Degenerative) 02/26/2018   Cervical Grade 1 Retrolisthesis of C5 over C6 02/26/2018   Cervical foraminal stenosis (C3-4, C4-5, and C5-6) (Bilateral) 02/26/2018   DDD  (degenerative disc disease), lumbosacral 02/26/2018   Osteoarthritis of lumbar spine 02/26/2018   Osteoarthritis of facet joint of lumbar spine 02/26/2018   Spondylosis without myelopathy or radiculopathy, cervicothoracic region 02/26/2018   Occipital headache (Right) 02/25/2018   Frontal headache (Left) 02/25/2018   Chronic low back pain (Midline) (Secondary Area of Pain) 02/25/2018   Chronic lumbar radiculopathy (Right) 02/25/2018   Osteoarthritis involving multiple joints 02/25/2018   Osteoarthritis of shoulder (Bilateral) 02/25/2018   Chronic musculoskeletal pain 02/25/2018   Lumbar facet syndrome (Bilateral) (R>L) 02/25/2018   Vitamin D deficiency 02/14/2018   Chronic pain syndrome 02/07/2018   Pharmacologic therapy 02/07/2018   Disorder of skeletal system 02/07/2018   Problems influencing health status 02/07/2018   Long term current use of opiate analgesic 02/07/2018   Chronic low back pain (Secondary Area of Pain) (Bilateral) (R>L) w/ sciatica (Right) 02/07/2018   Chronic lower extremity pain (Tertiary Area of Pain) (Bilateral) (R>L) 02/07/2018   Chronic knee pain (Fourth Area of Pain) (Bilateral) (R>L) 02/07/2018   Chronic shoulder pain (Fifth Area of Pain) (Bilateral) (R>L) 02/07/2018   Occipital neuralgia (Primary Area of Pain) (Bilateral) (R>L) 02/07/2018   Chronic neck pain (Bilateral) (R>L) 02/07/2018   Chronic sacroiliac joint pain (Bilateral) (R>L) 02/07/2018   H/O aneurysm 01/30/2018   History of depression 01/30/2018   History of seizure disorder 01/30/2018   Chronic headache disorder (Primary Area of Pain) 01/30/2018   Chronic migraine without aura 01/17/2018   Chronic respiratory failure with hypoxia (HCC) 01/17/2018   Constipation 01/17/2018   HTN, goal below 140/90 01/17/2018   Hypercholesterolemia 01/17/2018   Major depression in remission (HCC) 01/17/2018   COPD (chronic obstructive pulmonary disease) (HCC) 01/17/2018   Sarcoidosis 01/17/2018   Seizure  disorder (HCC) 01/17/2018   Cerebral aneurysm 01/17/2018   Nonruptured cerebral aneurysm 10/22/2017   Obstructive sleep apnea on CPAP 09/11/2017   Generalized anxiety disorder 01/26/2017   Irritable bowel syndrome 01/26/2017   Pulmonary emphysema (HCC) 01/26/2017   GERD (gastroesophageal reflux disease) 08/02/2016   Hiatal hernia 08/02/2016   Essential tremor 07/19/2016   Muscle spasm 10/05/2015   Spondylosis without myelopathy or radiculopathy, lumbosacral region 10/05/2015   Rotator cuff injury 09/17/2015   Chronic shoulder pain (Right) 09/17/2015   Epilepsy (HCC) 07/14/2015   Insomnia 07/14/2015   Osteoarthritis of knee (Bilateral) (R>L) 06/24/2015   PCP:  Sherron Monday, MD Pharmacy:   [facility] - [place], [place] - [address] [place] [address] Phone: [phone] Fax: [phone]     Social Determinants of Health (SDOH) Social History: SDOH Screenings   Food Insecurity: No Food Insecurity (10/22/2023)  Housing: Low Risk  (10/22/2023)  Transportation Needs: No Transportation Needs (10/22/2023)  Utilities: Not At Risk (10/22/2023)  Alcohol Screen: Low Risk  (04/17/2018)  Depression (PHQ2-9): Low Risk  (08/26/2020)  Financial Resource Strain: Low Risk  (03/05/2018)  Physical Activity: Unknown (03/05/2018)  Social Connections: Unknown (03/05/2018)  Stress: No Stress Concern Present (03/05/2018)  Tobacco Use: Low Risk  (10/22/2023)   SDOH Interventions:     Readmission Risk Interventions     No data to display

## 2023-10-22 NOTE — ED Notes (Addendum)
Pharmacy called for pt infiltrated line, per pharmacy not extra steps needed. Pt IV site wrapped and elevated, pt denied any tenderness or no notable redness at site, slight swelling noted

## 2023-10-22 NOTE — Evaluation (Signed)
Physical Therapy Evaluation Patient Details Name: Jean Gomez MRN: [id number] DOB: Aug 13, 1956 Today's Date: 10/22/2023  History of Present Illness  Pt is 67 y/o admitted 10/20/23 for Sepsisdue to gram-negative UTI. At baseline, pt is on 2-3L of O2 [place]. PmHx includes: COPD, seizure disorder, AKI, dementia, HTN, and syncope.   Clinical Impression  Pt received in bed on 2L of O2 [place] and agreed to PT session. Pt reported pain still present in right shoulder. Pt performed bed mobility ModI. Vitals read 132/80 mmHg while laying down, 148/95 mmHg while seated EOB where minimal dizziness was reported, and 157/140 mmHg while standing at EOB. Pt amb ~67ft in hallway with the use of RW (2wheels) on RA and presented with fatigue. During amb, vitals reached 127 bpm the highest and 92% Spo2 on RA the lowest. Pt ended session with vitals reading: 122/85 mmHg, 96% Spo2 on RA, and 106 bpm while seated EOB. Pt returned to bed ModI and was placed back on 2L of O2 [place]. Pt tolerated Tx fair today and will continue to benefit from skilled PT sessions to improve activity tolerance, endurance, and functional mobility to maximize safety following D/C.       If plan is discharge home, recommend the following: A little help with walking and/or transfers;A little help with bathing/dressing/bathroom;Help with stairs or ramp for entrance   Can travel by private vehicle        Equipment Recommendations None recommended by PT  Recommendations for Other Services       Functional Status Assessment Patient has had a recent decline in their functional status and demonstrates the ability to make significant improvements in function in a reasonable and predictable amount of time.     Precautions / Restrictions Precautions Precautions: Fall Restrictions Weight Bearing Restrictions: No      Mobility  Bed Mobility Overal bed mobility: Modified Independent             General bed mobility comments: Pt performed bed  mobility ModI. BP increased from sitting at EOB from a supine position.    Transfers Overall transfer level: Modified independent Equipment used: Rolling walker (2 wheels)               General transfer comment: Pt performed STS with the use of RW (2wheels). BP increased when standing from seated position. Pt did not report any s/sx relative to dizziness.    Ambulation/Gait Ambulation/Gait assistance: Contact guard assist Gait Distance (Feet): 25 Feet Assistive device: Rolling walker (2 wheels) Gait Pattern/deviations: Step-through pattern Gait velocity: Decreased     General Gait Details: Pt amb in hallway with the use of RW CGA. Pt instructed to perform pursed lip breathing at RR and HR increased.  Stairs            Wheelchair Mobility     Tilt Bed    Modified Rankin (Stroke Patients Only)       Balance Overall balance assessment: Needs assistance Sitting-balance support: Feet supported, No upper extremity supported Sitting balance-Leahy Scale: Good     Standing balance support: Bilateral upper extremity supported, During functional activity Standing balance-Leahy Scale: Fair                               Pertinent Vitals/Pain Pain Assessment Pain Assessment: Faces Faces Pain Scale: Hurts a little bit Pain Location: Right shoulder Pain Descriptors / Indicators: Aching Pain Intervention(s): Monitored during session    Home Living  Family/patient expects to be discharged to:: Private residence Living Arrangements: Children Available Help at Discharge: Family;Available PRN/intermittently Type of Home: Apartment Home Access: Level entry       Home Layout: One level Home Equipment: Cane - single point;Shower seat;Rollator (4 wheels)      Prior Function Prior Level of Function : Independent/Modified Independent;Driving             Mobility Comments: SPC or less for AMB in home. ADLs Comments: She is Ind in self care tasks and  most IADLs and still drives. Daughter comes to check on her at night and she goes to stay with daugher on the weekends.     Extremity/Trunk Assessment   Upper Extremity Assessment Upper Extremity Assessment: Overall WFL for tasks assessed    Lower Extremity Assessment Lower Extremity Assessment: Overall WFL for tasks assessed       Communication   Communication Communication: No apparent difficulties Cueing Techniques: Verbal cues  Cognition Arousal: Alert Behavior During Therapy: WFL for tasks assessed/performed Overall Cognitive Status: Within Functional Limits for tasks assessed                                 General Comments: AOx4. Pt pleasant and willing to participate in PT session.        General Comments General comments (skin integrity, edema, etc.): Pt was bleeding during session due to IV being tugged with bed mobility. RN notified.    Exercises     Assessment/Plan    PT Assessment Patient needs continued PT services  PT Problem List Decreased balance;Decreased mobility;Decreased activity tolerance       PT Treatment Interventions DME instruction;Gait training    PT Goals (Current goals can be found in the Care Plan section)  Acute Rehab PT Goals Patient Stated Goal: To go home and get better PT Goal Formulation: With patient Time For Goal Achievement: 11/05/23 Potential to Achieve Goals: Good    Frequency Min 1X/week     Co-evaluation               AM-PAC PT "6 Clicks" Mobility  Outcome Measure Help needed turning from your back to your side while in a flat bed without using bedrails?: None Help needed moving from lying on your back to sitting on the side of a flat bed without using bedrails?: None Help needed moving to and from a bed to a chair (including a wheelchair)?: A Little Help needed standing up from a chair using your arms (e.g., wheelchair or bedside chair)?: A Little Help needed to walk in hospital room?: A  Little Help needed climbing 3-5 steps with a railing? : A Lot 6 Click Score: 19    End of Session Equipment Utilized During Treatment: Gait belt Activity Tolerance: Patient tolerated treatment well Patient left: in bed;with call bell/phone within reach Nurse Communication: Mobility status PT Visit Diagnosis: Unsteadiness on feet (R26.81);Other abnormalities of gait and mobility (R26.89);Muscle weakness (generalized) (M62.81);Difficulty in walking, not elsewhere classified (R26.2)    Time: 9629-5284 PT Time Calculation (min) (ACUTE ONLY): 33 min   Charges:   PT Evaluation $PT Eval Low Complexity: 1 Low PT Treatments $Gait Training: 8-22 mins PT General Charges $$ ACUTE PT VISIT: 1 Visit         Kahmari Herard Sauvignon Howard SPT, LAT, ATC  Jaspreet Hollings Sauvignon-Howard 10/22/2023, 2:11 PM

## 2023-10-22 NOTE — ED Notes (Signed)
Lab called for pt blood drawn

## 2023-10-22 NOTE — ED Notes (Signed)
Pt given warm blanket, pt denies other needs at this time

## 2023-10-22 NOTE — ED Notes (Signed)
Pt ambualted to the toilet for  BM.

## 2023-10-22 NOTE — ED Notes (Signed)
Pt given warm wipes and wash clothes with soapy water to clean herself. Pt given new gown and brief, deodorant and toothbrush,

## 2023-10-22 NOTE — Progress Notes (Signed)
Progress Note   Patient: Jean Gomez JWJ:[id number] DOB: 1956-08-30 DOA: 10/20/2023     1 DOS: the patient was seen and examined on 10/22/2023   Brief hospital course: SHAUNTEL PREST is a 67 y.o. female with medical history significant for COPD, depression, hypertension, dyslipidemia, osteoarthritis, sarcoidosis, seizure disorder and OSA as well as asthma, who presented to the emergency room with acute onset of dizziness and syncope at home.  Patient's blood pressure found to be very low, UA remarkable, creatinine 3 times higher than normal, admitted to hospitalist service for further management evaluation of sepsis, UTI, syncope, AKI.  Assessment and Plan: * Severe sepsis due to gram-negative UTI with kidney injury, She presented with tachycardia, tachypnea, and leukocytosis, low BP UA abnormal, urine cultures added to prior sample. BP improved with fluids. Continue antibiotic therapy with IV Rocephin. Follow urine and blood cultures. Gentle IV hydration with IV lactated ringer, rate decreased to 36ml/hr.  Syncope secondary to hypotension associated with her sepsis. Gentle IV fluids. Check orthostatics.  AKI (acute kidney injury) (HCC) Prerenal due to hypovolemia and hypotension. Continue hydration as mentioned above. Monitor daily renal function. Repeat BMP pending. Avoid nephrotoxic drugs.  COPD (chronic obstructive pulmonary disease) (HCC) No exacerbation. Continue her inhalers.  Dementia, presenile with depression (HCC) Continue Aricept and Remeron as well as Celexa and Xanax.  Peptic ulcer disease Continue PPI therapy as well as Carafate given her recent acute gastric ulcer.  Essential hypertension Hold oral antihypertensives due to her low BP. BP improved with fluids, stop midodrine.  Dyslipidemia Continue statin therapy.  Seizure disorder (HCC) On Keppra.    Out of bed to chair. Incentive spirometry. Nursing supportive care. Fall, aspiration  precautions. DVT prophylaxis   Code Status: Full Code  Subjective: Patient is seen and examined today morning. She is lying comfortably. Feels weak, ate better. Did not get out of bed. Encouraged to work with PT.  Physical Exam: Vitals:   10/22/23 1330 10/22/23 1400 10/22/23 1430 10/22/23 1500  BP: 127/70 129/70 (!) 150/84 (!) 138/93  Pulse: 98 92 91 94  Resp: 14 13 17 18   Temp:      TempSrc:      SpO2: 100% 100% 100% 100%  Weight:      Height:        General - Elderly African American ill female, no apparent distress HEENT - PERRLA, EOMI, atraumatic head, non tender sinuses. Lung - Clear, no rales, rhonchi, wheezes. Heart - S1, S2 heard, no murmurs, rubs, trace pedal edema. Abdomen - Soft, non tender, bowel sounds good Neuro - Alert, awake and oriented x 3, non focal exam. Skin - Warm and dry.  Data Reviewed:      Latest Ref Rng & Units 10/21/2023    4:50 AM 10/20/2023    6:28 PM 10/16/2023   11:54 AM  CBC  WBC 4.0 - 10.5 K/uL 15.8  15.2  10.1   Hemoglobin 12.0 - 15.0 g/dL 9.0  62.1  30.8   Hematocrit 36.0 - 46.0 % 29.6  34.4  34.6   Platelets 150 - 400 K/uL 435  453  652       Latest Ref Rng & Units 10/21/2023    4:50 AM 10/20/2023    6:28 PM 10/02/2023    5:47 AM  BMP  Glucose 70 - 99 mg/dL 85  657  73   BUN 8 - 23 mg/dL 21  26  10    Creatinine 0.44 - 1.00 mg/dL 8.46  9.62  0.87   Sodium 135 - 145 mmol/L 141  134  138   Potassium 3.5 - 5.1 mmol/L 3.3  3.6  3.8   Chloride 98 - 111 mmol/L 109  101  104   CO2 22 - 32 mmol/L 26  21  24    Calcium 8.9 - 10.3 mg/dL 8.4  8.4  8.2    CT Renal Stone Study  Result Date: 10/20/2023 CLINICAL DATA:  Abdominal and flank pain EXAM: CT ABDOMEN AND PELVIS WITHOUT CONTRAST TECHNIQUE: Multidetector CT imaging of the abdomen and pelvis was performed following the standard protocol without IV contrast. RADIATION DOSE REDUCTION: This exam was performed according to the departmental dose-optimization program which includes automated  exposure control, adjustment of the mA and/or kV according to patient size and/or use of iterative reconstruction technique. COMPARISON:  09/29/2023 FINDINGS: Lower chest: Cylindrical bronchiectasis and volume loss within the lower lobes bilaterally, right greater than left, is again noted no acute bone abnormality. No acute abnormality. Hepatobiliary: Cholecystectomy has been performed. Mild extrahepatic biliary ductal dilation appears stable since prior examination, possibly representing post cholecystectomy change. There is, however, heterogeneity of the intraluminal contents within the distal common duct, best seen on image # 32/2 and intraluminal debris, clot, or calculi is not excluded. No intrahepatic biliary ductal dilation. The liver is otherwise unremarkable on this noncontrast examination. Pancreas: Unremarkable Spleen: Unremarkable Adrenals/Urinary Tract: Adrenal glands are unremarkable. Kidneys are normal, without renal calculi, focal lesion, or hydronephrosis. Bladder is unremarkable. Stomach/Bowel: Stomach is within normal limits. Appendix appears normal. No evidence of bowel wall thickening, distention, or inflammatory changes. Vascular/Lymphatic: Aortic atherosclerosis. No enlarged abdominal or pelvic lymph nodes. Reproductive: Status post hysterectomy. No adnexal masses. Other: No abdominal wall hernia or abnormality. No abdominopelvic ascites. Musculoskeletal: Degenerative changes are seen within the lumbar spine with stable grade 1 anterolisthesis L5-S1. No acute bone abnormality. No lytic or blastic bone lesion. IMPRESSION: 1. No definite acute intra-abdominal pathology identified. 2. Mild extrahepatic biliary ductal dilation appears stable since prior examination, possibly representing post cholecystectomy change. Heterogeneity of the intraluminal contents within the distal common duct, possibly representing intraluminal debris, clot, or calculi. Correlation with liver function tests is  recommended. If indicated, this could be better assessed with MRCP. 3. Cylindrical bronchiectasis and volume loss within the lower lobes bilaterally, right greater than left, unchanged, and in keeping with chronic changes of the patient's known sarcoidosis, better assessed on chest CT of 02/11/2018. Aortic Atherosclerosis (ICD10-I70.0). Electronically Signed   By: Helyn Numbers M.D.   On: 10/20/2023 21:49   DG Shoulder Right  Result Date: 10/20/2023 CLINICAL DATA:  Right shoulder pain EXAM: RIGHT SHOULDER - 2+ VIEW COMPARISON:  02/07/2018 FINDINGS: Interval superior subluxation of the right humeral head suggesting underlying rotator cuff degeneration. Progressive asymmetric joint space narrowing and osteophyte formation in keeping with mild to moderate glenohumeral degenerative arthritis. Acromioclavicular joint space is preserved. No acute fracture or frank dislocation. Visualized right hemithorax is unremarkable. IMPRESSION: 1. Progressive mild to moderate glenohumeral degenerative arthritis. 2. Interval superior subluxation of the right humeral head suggesting underlying rotator cuff degeneration. Electronically Signed   By: Helyn Numbers M.D.   On: 10/20/2023 21:37   DG Chest Portable 1 View  Result Date: 10/20/2023 CLINICAL DATA:  Syncope EXAM: PORTABLE CHEST 1 VIEW COMPARISON:  None Available. FINDINGS: Lung volumes are symmetric though pulmonary insufflation has slightly diminished since prior examination. Bilateral perihilar and upper lung zone parenchymal scarring is again identified with superior retraction of the hila bilaterally. Rounded opacity  at the right apex is new from prior examination and is indeterminate. Un underlying pulmonary nodule is not excluded. No superimposed confluent pulmonary infiltrate. No pneumothorax or pleural effusion. Cardiac size within normal limits. No acute bone abnormality. IMPRESSION: 1. Slight interval decrease in pulmonary insufflation. 2. Bilateral perihilar  and upper lung zone parenchymal scarring. 3. Rounded opacity at the right apex, new from prior examination. Underlying pulmonary nodule is not excluded. Dedicated two view chest radiograph or CT imaging is recommended for further characterization. Electronically Signed   By: Helyn Numbers M.D.   On: 10/20/2023 18:57     Family Communication: Discussed with patient, she understand and agree. All questions answereed.  Disposition: Status is: Inpatient Changed to full admission due to AKI, UTI, syncope work up.  Planned Discharge Destination: Home with Home Health     Time spent: 38 minutes  Author: Marcelino Duster, MD 10/22/2023 3:47 PM Secure chat 7am to 7pm For on call review [email or web address].

## 2023-10-23 ENCOUNTER — Other Ambulatory Visit: Payer: Self-pay | Admitting: Internal Medicine

## 2023-10-23 DIAGNOSIS — N179 Acute kidney failure, unspecified: Secondary | ICD-10-CM | POA: Diagnosis not present

## 2023-10-23 DIAGNOSIS — R55 Syncope and collapse: Secondary | ICD-10-CM | POA: Diagnosis not present

## 2023-10-23 DIAGNOSIS — E876 Hypokalemia: Secondary | ICD-10-CM | POA: Insufficient documentation

## 2023-10-23 DIAGNOSIS — A4150 Gram-negative sepsis, unspecified: Secondary | ICD-10-CM | POA: Diagnosis not present

## 2023-10-23 DIAGNOSIS — I10 Essential (primary) hypertension: Secondary | ICD-10-CM | POA: Diagnosis not present

## 2023-10-23 LAB — BASIC METABOLIC PANEL
Anion gap: 9 (ref 5–15)
BUN: 9 mg/dL (ref 8–23)
CO2: 30 mmol/L (ref 22–32)
Calcium: 8.4 mg/dL — ABNORMAL LOW (ref 8.9–10.3)
Chloride: 99 mmol/L (ref 98–111)
Creatinine, Ser: 1.08 mg/dL — ABNORMAL HIGH (ref 0.44–1.00)
GFR, Estimated: 56 mL/min — ABNORMAL LOW (ref >60)
Glucose, Bld: 77 mg/dL (ref 70–99)
Potassium: 3.4 mmol/L — ABNORMAL LOW (ref 3.5–5.1)
Sodium: 138 mmol/L (ref 135–145)

## 2023-10-23 LAB — CBC
HCT: 33.5 % — ABNORMAL LOW (ref 36.0–46.0)
Hemoglobin: 10.3 g/dL — ABNORMAL LOW (ref 12.0–15.0)
MCH: 24.6 pg — ABNORMAL LOW (ref 26.0–34.0)
MCHC: 30.7 g/dL (ref 30.0–36.0)
MCV: 80.1 fL (ref 80.0–100.0)
Platelets: 467 10*3/uL — ABNORMAL HIGH (ref 150–400)
RBC: 4.18 MIL/uL (ref 3.87–5.11)
RDW: 18.1 % — ABNORMAL HIGH (ref 11.5–15.5)
WBC: 6.2 10*3/uL (ref 4.0–10.5)
nRBC: 0 % (ref 0.0–0.2)

## 2023-10-23 LAB — GLUCOSE, CAPILLARY: Glucose-Capillary: 78 mg/dL (ref 70–99)

## 2023-10-23 MED ORDER — ONDANSETRON 4 MG PO TBDP: 4.0000 mg | ORAL_TABLET | Freq: Three times a day (TID) | ORAL | 0 refills | Status: DC | PRN

## 2023-10-23 MED ORDER — ORAL CARE MOUTH RINSE: 15.0000 mL | OROMUCOSAL | Status: DC | PRN

## 2023-10-23 MED ORDER — CIPROFLOXACIN HCL 500 MG PO TABS: 500.0000 mg | ORAL_TABLET | Freq: Two times a day (BID) | ORAL | 0 refills | Status: AC

## 2023-10-23 NOTE — TOC Transition Note (Signed)
Transition of Care [facility]) - CM/SW Discharge Note   Patient Details  Name: Jean Gomez MRN: [id number] Date of Birth: 06-20-1956  [facility]) CM/SW Contact:  Garret Reddish, RN Phone Number: 10/23/2023, 12:06 PM   Clinical Narrative:     Chart reviewed.  Noted that patient orders for discharge today.    I have informed patient that PT has recommended that patient has home health on discharge.  Patient has declined home health services at this time.  Patient reports that she has a supportive daughter, and son-in-law that will assist with her care at home.  No other TOC needs have been identified.    I have informed staff nurse of the above information.    Final next level of care: Home/Self Care (Patient declined Home health services) Barriers to Discharge: No Barriers Identified   Patient Goals and CMS Choice CMS Medicare.gov Compare Post Acute Care list provided to:: Patient Choice offered to / list presented to : Patient  Discharge Placement                      Patient and family notified of of transfer: 10/23/23  Discharge Plan and Services Additional resources added to the After Visit Summary for                  DME Arranged:  (Patient has home 02, a rolling walker, cane and nebulizer)                    Social Determinants of Health (SDOH) Interventions SDOH Screenings   Food Insecurity: No Food Insecurity (10/22/2023)  Housing: Low Risk  (10/22/2023)  Transportation Needs: No Transportation Needs (10/22/2023)  Utilities: Not At Risk (10/22/2023)  Alcohol Screen: Low Risk  (04/17/2018)  Depression (PHQ2-9): Low Risk  (08/26/2020)  Financial Resource Strain: Low Risk  (03/05/2018)  Physical Activity: Unknown (03/05/2018)  Social Connections: Unknown (03/05/2018)  Stress: No Stress Concern Present (03/05/2018)  Tobacco Use: Low Risk  (10/22/2023)     Readmission Risk Interventions     No data to display

## 2023-10-23 NOTE — Plan of Care (Signed)

## 2023-10-23 NOTE — Progress Notes (Signed)
Patient notified

## 2023-10-23 NOTE — Discharge Summary (Addendum)
Physician Discharge Summary   Patient: Jean Gomez MRN: [id number] DOB: 02/07/1956  Admit date:     10/20/2023  Discharge date: 10/23/23  Discharge Physician: Marcelino Duster   PCP: Sherron Monday, MD   Recommendations at discharge:    PCP follow up in 1 week  Discharge Diagnoses: Principal Problem:   Sepsis due to gram-negative UTI [facility]) Active Problems:   Chronic pain syndrome   AKI (acute kidney injury) (HCC)   Syncope   COPD (chronic obstructive pulmonary disease) (HCC)   Seizure disorder (HCC)   Dyslipidemia   Essential hypertension   Peptic ulcer disease   Dementia, presenile with depression (HCC)   Hypokalemia  Resolved Problems:   * No resolved hospital problems. *  Hospital Course: Jean Gomez is a 67 y.o. female with medical history significant for COPD, depression, hypertension, dyslipidemia, osteoarthritis, sarcoidosis, seizure disorder and OSA as well as asthma, who presented to the emergency room with acute onset of dizziness and syncope at home.  Patient's blood pressure found to be very low, UA remarkable, creatinine 3 times higher than normal, admitted to hospitalist service for further management evaluation of sepsis, UTI, syncope, AKI.   Assessment and Plan: * Severe sepsis due to gram-negative UTI with kidney injury, She presented with tachycardia, tachypnea, and leukocytosis, low BP UA abnormal, did not see urine culture results even though added to prior sample. Blood cultures so far negative. WBC trended down. BP improved with fluids. IV Rocephin transitioned to oral cipro for 3 more days.  I advised her to follow up with PCP in 1 week.   Syncope secondary to hypotension associated with her sepsis. No more episodes, she did walk with PT.   AKI (acute kidney injury) (HCC) Prerenal due to hypovolemia and hypotension. Renal function improved. Avoid nephrotoxic drugs.   COPD (chronic obstructive pulmonary disease) (HCC) No  exacerbation. Continue her inhalers.   Dementia, presenile with depression (HCC) Continue Aricept and Remeron as well as Celexa and Xanax.   Peptic ulcer disease Continue PPI therapy as well as Carafate given her recent acute gastric ulcer.   Essential hypertension BP improved with fluids. Resumed home meds.   Dyslipidemia Continue statin therapy.   Seizure disorder (HCC) On Keppra.       Consultants: none Procedures performed: none  Disposition: Home Diet recommendation:  Discharge Diet Orders (From admission, onward)     Start     Ordered   10/23/23 0000  Diet - low sodium heart healthy        10/23/23 1119           Cardiac diet DISCHARGE MEDICATION: Allergies as of 10/23/2023       Reactions   Penicillins Rash, Hives, Itching   Contrast Media [iodinated Contrast Media] Swelling   Gabapentin    Labetalol Other (See Comments)   Made hair fall out   Lactose Intolerance (gi)    Bloating and diarrhea    Sulfabenzamide Nausea Only   Pregabalin Other (See Comments)   The patient complained of hemifacial numbness with the use of the medication.        Medication List     TAKE these medications    Albuterol Sulfate 108 (90 Base) MCG/ACT Aepb Commonly known as: PROAIR RESPICLICK Inhale 2 puffs into the lungs every 6 (six) hours as needed.   albuterol (2.5 MG/3ML) 0.083% nebulizer solution Commonly known as: PROVENTIL Take 2.5 mg by nebulization every 6 (six) hours as needed for wheezing or shortness of  breath.   ALPRAZolam 0.25 MG tablet Commonly known as: XANAX Take 0.25 mg by mouth at bedtime as needed for sleep.   amLODipine-olmesartan 5-40 MG tablet Commonly known as: AZOR TAKE 1 TABLET BY MOUTH EVERY MORNING   Arnuity Ellipta 100 MCG/ACT Aepb Generic drug: Fluticasone Furoate INHALE 1 INHALATION INTO THE LUNGS ONCE DAILY.   aspirin EC 81 MG tablet Take 1 tablet (81 mg total) by mouth daily.   atorvastatin 80 MG tablet Commonly known  as: LIPITOR TAKE 1 TABLET BY MOUTH NIGHTLY   Belsomra 20 MG Tabs Generic drug: Suvorexant TAKE ONE TABLET BY MOUTH AT BEDTIME   calcium carbonate 1250 (500 Ca) MG chewable tablet Commonly known as: OS-CAL Chew 1 tablet by mouth daily.   ciprofloxacin 500 MG tablet Commonly known as: Cipro Take 1 tablet (500 mg total) by mouth 2 (two) times daily for 3 days.   citalopram 40 MG tablet Commonly known as: CELEXA TAKE ONE TABLET BY MOUTH EVERY MORNING   dicyclomine 20 MG tablet Commonly known as: BENTYL Take 1 tablet (20 mg total) by mouth every 6 (six) hours as needed for up to 7 days for spasms.   diphenhydrAMINE 25 MG tablet Commonly known as: BENADRYL Take 25 mg by mouth every 6 (six) hours as needed.   donepezil 10 MG tablet Commonly known as: ARICEPT TAKE ONE TABLET BY MOUTH EVERY MORNING   hydrochlorothiazide 25 MG tablet Commonly known as: HYDRODIURIL TAKE 1 TABLET BY MOUTH EVERY MORNING   lactulose 10 GM/15ML solution Commonly known as: CHRONULAC Take 10 g by mouth 2 (two) times daily as needed for mild constipation.   levETIRAcetam 750 MG tablet Commonly known as: KEPPRA TAKE ONE TABLET BY MOUTH EVERY MORNING   Linzess 290 MCG Caps capsule Generic drug: linaclotide TAKE 1 CAPSULE BY MOUTH EVERY MORNING   Medical Compression Stockings Misc Please provide compression stockings   mirtazapine 15 MG tablet Commonly known as: REMERON Take 1 tablet (15 mg total) by mouth at bedtime.   nitroGLYCERIN 0.4 MG SL tablet Commonly known as: NITROSTAT Place 0.4 mg under the tongue every 5 (five) minutes as needed for chest pain.   ondansetron 4 MG disintegrating tablet Commonly known as: ZOFRAN-ODT Take 1 tablet (4 mg total) by mouth every 8 (eight) hours as needed for nausea or vomiting.   Oxycodone HCl 10 MG Tabs Take 10 mg by mouth 3 (three) times daily as needed.   pantoprazole 40 MG tablet Commonly known as: PROTONIX Take 1 tablet (40 mg total) by  mouth 2 (two) times daily.   potassium chloride SA 20 MEQ tablet Commonly known as: KLOR-CON M TAKE ONE TABLET BY MOUTH EVERY MORNING   promethazine 25 MG tablet Commonly known as: PHENERGAN Take 1 tablet (25 mg total) by mouth every 6 (six) hours as needed for nausea or vomiting.   sucralfate 1 g tablet Commonly known as: CARAFATE TAKE ONE TABLET BY MOUTH FOUR TIMES DAILY BEFORE MEALS AND AT BEDTIME   tiZANidine 4 MG tablet Commonly known as: ZANAFLEX Take 1 tablet (4 mg total) by mouth 3 (three) times daily. What changed:  when to take this reasons to take this        Follow-up Information     Sherron Monday, MD Follow up in 1 week(s).   Specialty: Internal Medicine Contact information: [address] [place] [address] [phone]                Discharge Exam: Ceasar Mons Weights  10/20/23 1925  Weight: 73.6 kg   General - Elderly African American ill female, no apparent distress HEENT - PERRLA, EOMI, atraumatic head, non tender sinuses. Lung - Clear, no rales, rhonchi, wheezes. Heart - S1, S2 heard, no murmurs, rubs, trace pedal edema. Abdomen - Soft, non tender, bowel sounds good Neuro - Alert, awake and oriented x 3, non focal exam. Skin - Warm and dry.  Condition at discharge: stable  The results of significant diagnostics from this hospitalization (including imaging, microbiology, ancillary and laboratory) are listed below for reference.   Imaging Studies: CT Renal Stone Study  Result Date: 10/20/2023 CLINICAL DATA:  Abdominal and flank pain EXAM: CT ABDOMEN AND PELVIS WITHOUT CONTRAST TECHNIQUE: Multidetector CT imaging of the abdomen and pelvis was performed following the standard protocol without IV contrast. RADIATION DOSE REDUCTION: This exam was performed according to the departmental dose-optimization program which includes automated exposure control, adjustment of the mA and/or kV according to patient size and/or use of iterative  reconstruction technique. COMPARISON:  09/29/2023 FINDINGS: Lower chest: Cylindrical bronchiectasis and volume loss within the lower lobes bilaterally, right greater than left, is again noted no acute bone abnormality. No acute abnormality. Hepatobiliary: Cholecystectomy has been performed. Mild extrahepatic biliary ductal dilation appears stable since prior examination, possibly representing post cholecystectomy change. There is, however, heterogeneity of the intraluminal contents within the distal common duct, best seen on image # 32/2 and intraluminal debris, clot, or calculi is not excluded. No intrahepatic biliary ductal dilation. The liver is otherwise unremarkable on this noncontrast examination. Pancreas: Unremarkable Spleen: Unremarkable Adrenals/Urinary Tract: Adrenal glands are unremarkable. Kidneys are normal, without renal calculi, focal lesion, or hydronephrosis. Bladder is unremarkable. Stomach/Bowel: Stomach is within normal limits. Appendix appears normal. No evidence of bowel wall thickening, distention, or inflammatory changes. Vascular/Lymphatic: Aortic atherosclerosis. No enlarged abdominal or pelvic lymph nodes. Reproductive: Status post hysterectomy. No adnexal masses. Other: No abdominal wall hernia or abnormality. No abdominopelvic ascites. Musculoskeletal: Degenerative changes are seen within the lumbar spine with stable grade 1 anterolisthesis L5-S1. No acute bone abnormality. No lytic or blastic bone lesion. IMPRESSION: 1. No definite acute intra-abdominal pathology identified. 2. Mild extrahepatic biliary ductal dilation appears stable since prior examination, possibly representing post cholecystectomy change. Heterogeneity of the intraluminal contents within the distal common duct, possibly representing intraluminal debris, clot, or calculi. Correlation with liver function tests is recommended. If indicated, this could be better assessed with MRCP. 3. Cylindrical bronchiectasis and  volume loss within the lower lobes bilaterally, right greater than left, unchanged, and in keeping with chronic changes of the patient's known sarcoidosis, better assessed on chest CT of 02/11/2018. Aortic Atherosclerosis (ICD10-I70.0). Electronically Signed   By: Helyn Numbers M.D.   On: 10/20/2023 21:49   DG Shoulder Right  Result Date: 10/20/2023 CLINICAL DATA:  Right shoulder pain EXAM: RIGHT SHOULDER - 2+ VIEW COMPARISON:  02/07/2018 FINDINGS: Interval superior subluxation of the right humeral head suggesting underlying rotator cuff degeneration. Progressive asymmetric joint space narrowing and osteophyte formation in keeping with mild to moderate glenohumeral degenerative arthritis. Acromioclavicular joint space is preserved. No acute fracture or frank dislocation. Visualized right hemithorax is unremarkable. IMPRESSION: 1. Progressive mild to moderate glenohumeral degenerative arthritis. 2. Interval superior subluxation of the right humeral head suggesting underlying rotator cuff degeneration. Electronically Signed   By: Helyn Numbers M.D.   On: 10/20/2023 21:37   DG Chest Portable 1 View  Result Date: 10/20/2023 CLINICAL DATA:  Syncope EXAM: PORTABLE CHEST 1 VIEW COMPARISON:  None Available.  FINDINGS: Lung volumes are symmetric though pulmonary insufflation has slightly diminished since prior examination. Bilateral perihilar and upper lung zone parenchymal scarring is again identified with superior retraction of the hila bilaterally. Rounded opacity at the right apex is new from prior examination and is indeterminate. Un underlying pulmonary nodule is not excluded. No superimposed confluent pulmonary infiltrate. No pneumothorax or pleural effusion. Cardiac size within normal limits. No acute bone abnormality. IMPRESSION: 1. Slight interval decrease in pulmonary insufflation. 2. Bilateral perihilar and upper lung zone parenchymal scarring. 3. Rounded opacity at the right apex, new from prior  examination. Underlying pulmonary nodule is not excluded. Dedicated two view chest radiograph or CT imaging is recommended for further characterization. Electronically Signed   By: Helyn Numbers M.D.   On: 10/20/2023 18:57   ECHOCARDIOGRAM COMPLETE BUBBLE STUDY  Result Date: 09/30/2023    ECHOCARDIOGRAM REPORT   Patient Name:   Jean Gomez Date of Exam: 09/30/2023 Medical Rec #:  [id number]        Height:       63.0 in Accession #:    [id number]       Weight:       176.0 lb Date of Birth:  1956/03/15        BSA:          1.831 m Patient Age:    47 years         BP:           128/82 mmHg Patient Gender: F                HR:           104 bpm. Exam Location:  [facility] Procedure: 2D Echo, Saline Contrast Bubble Study and Intracardiac Opacification            Agent Indications:     CHF  History:         Patient has no prior history of Echocardiogram examinations.  Sonographer:     Overton Mam RDCS, FASE Referring Phys:  [id number] Eliezer Mccoy PATEL Diagnosing Phys: Lennie Odor MD  Sonographer Comments: Technically difficult study due to poor echo windows, suboptimal parasternal window and suboptimal apical window. Image acquisition challenging due to respiratory motion. IMPRESSIONS  1. Left ventricular ejection fraction, by estimation, is 65 to 70%. The left ventricle has normal function. The left ventricle has no regional wall motion abnormalities. There is mild concentric left ventricular hypertrophy. Indeterminate diastolic filling due to E-A fusion.  2. Right ventricular systolic function is normal. The right ventricular size is normal. There is moderately elevated pulmonary artery systolic pressure. The estimated right ventricular systolic pressure is 47.6 mmHg.  3. The mitral valve is grossly normal. Trivial mitral valve regurgitation. No evidence of mitral stenosis.  4. The aortic valve is grossly normal. Aortic valve regurgitation is not visualized. No aortic stenosis is present.  5. The inferior vena cava  is normal in size with greater than 50% respiratory variability, suggesting right atrial pressure of 3 mmHg.  6. Agitated saline contrast bubble study was negative, with no evidence of any interatrial shunt. FINDINGS  Left Ventricle: Left ventricular ejection fraction, by estimation, is 65 to 70%. The left ventricle has normal function. The left ventricle has no regional wall motion abnormalities. Definity contrast agent was given IV to delineate the left ventricular  endocardial borders. The left ventricular internal cavity size was normal in size. There is mild concentric left ventricular hypertrophy. Indeterminate diastolic filling due to E-A fusion.  Right Ventricle: The right ventricular size is normal. No increase in right ventricular wall thickness. Right ventricular systolic function is normal. There is moderately elevated pulmonary artery systolic pressure. The tricuspid regurgitant velocity is 3.34 m/s, and with an assumed right atrial pressure of 3 mmHg, the estimated right ventricular systolic pressure is 47.6 mmHg. Left Atrium: Left atrial size was normal in size. Right Atrium: Right atrial size was normal in size. Pericardium: Trivial pericardial effusion is present. Presence of epicardial fat layer. Mitral Valve: The mitral valve is grossly normal. Trivial mitral valve regurgitation. No evidence of mitral valve stenosis. Tricuspid Valve: The tricuspid valve is grossly normal. Tricuspid valve regurgitation is mild . No evidence of tricuspid stenosis. Aortic Valve: The aortic valve is grossly normal. Aortic valve regurgitation is not visualized. No aortic stenosis is present. Aortic valve peak gradient measures 15.4 mmHg. Pulmonic Valve: The pulmonic valve was grossly normal. Pulmonic valve regurgitation is not visualized. No evidence of pulmonic stenosis. Aorta: The aortic root and ascending aorta are structurally normal, with no evidence of dilitation. Venous: The inferior vena cava is normal in size  with greater than 50% respiratory variability, suggesting right atrial pressure of 3 mmHg. IAS/Shunts: The atrial septum is grossly normal. Agitated saline contrast was given intravenously to evaluate for intracardiac shunting. Agitated saline contrast bubble study was negative, with no evidence of any interatrial shunt.  LEFT VENTRICLE PLAX 2D LVIDd:         3.60 cm   Diastology LVIDs:         2.10 cm   LV e' lateral: 11.30 cm/s LV PW:         1.10 cm LV IVS:        1.20 cm LVOT diam:     1.80 cm LV SV:         81 LV SV Index:   44 LVOT Area:     2.54 cm  LEFT ATRIUM           Index        RIGHT ATRIUM           Index LA diam:      3.20 cm 1.75 cm/m   RA Area:     12.90 cm LA Vol (A4C): 35.9 ml 19.60 ml/m  RA Volume:   32.80 ml  17.91 ml/m  AORTIC VALVE                  PULMONIC VALVE AV Area (Vmax): 2.12 cm      PV Vmax:        1.60 m/s AV Vmax:        196.00 cm/s   PV Peak grad:   10.2 mmHg AV Peak Grad:   15.4 mmHg     RVOT Peak grad: 8 mmHg LVOT Vmax:      163.00 cm/s LVOT Vmean:     113.000 cm/s LVOT VTI:       0.320 m  AORTA Ao Root diam: 3.00 cm Ao Asc diam:  2.50 cm TRICUSPID VALVE TR Peak grad:   44.6 mmHg TR Vmax:        334.00 cm/s  SHUNTS Systemic VTI:  0.32 m Systemic Diam: 1.80 cm Lennie Odor MD Electronically signed by Lennie Odor MD Signature Date/Time: 09/30/2023/1:06:27 PM    Final    CT ABDOMEN PELVIS WO CONTRAST  Result Date: 09/29/2023 CLINICAL DATA:  Sepsis. EXAM: CT ABDOMEN AND PELVIS WITHOUT CONTRAST TECHNIQUE: Multidetector CT imaging of the abdomen and pelvis  was performed following the standard protocol without IV contrast. RADIATION DOSE REDUCTION: This exam was performed according to the departmental dose-optimization program which includes automated exposure control, adjustment of the mA and/or kV according to patient size and/or use of iterative reconstruction technique. COMPARISON:  08/30/2023 FINDINGS: Lower chest: Emphysematous changes noted within the imaged  portions of the lung bases. No pleural effusion or airspace consolidation. Hepatobiliary: No focal liver abnormality is seen. Status post cholecystectomy. No biliary dilatation. Pancreas: Unremarkable. No pancreatic ductal dilatation or surrounding inflammatory changes. Spleen: Normal in size without focal abnormality. Adrenals/Urinary Tract: Adrenal glands are unremarkable. Kidneys are normal, without renal calculi, focal lesion, or hydronephrosis. Bladder is unremarkable. Stomach/Bowel: Stomach is normal. The appendix is visualized and appears normal. No pathologic dilatation of the large or small bowel loops. Liquid and solid stool identified within the proximal and mid colon. No bowel wall thickening, inflammation or distension. Vascular/Lymphatic: Aortic atherosclerosis. No aneurysm. Prominent central mesenteric lymph nodes are identified measuring up to 1 cm. For example index lymph node measures 1 cm, image 37/3. Previously 0.9 cm. Lymph node ventral to the aortic bifurcation measures 1 cm, image 46/3. Formally 1.1 cm. No pelvic or inguinal adenopathy. Reproductive: Status post hysterectomy. No adnexal masses. Other: There is no ascites. No signs of pneumoperitoneum. No discrete fluid collections. Musculoskeletal: No acute or significant osseous findings. No acute or suspicious osseous findings. Mild lumbar degenerative disc disease with first degree anterolisthesis of L5 on S1. Stable. IMPRESSION: 1. No acute findings within the abdomen or pelvis. 2. Liquid and solid stool identified within the proximal and mid colon. This is a nonspecific finding but can be seen in the setting of diarrheal illness. 3. Prominent central mesenteric lymph nodes are identified measuring up to 1 cm. These are nonspecific and may be reactive. These appear unchanged compared with 08/30/2023. 4. Aortic Atherosclerosis (ICD10-I70.0) and Emphysema (ICD10-J43.9). Electronically Signed   By: Signa Kell M.D.   On: 09/29/2023 14:44    DG Chest Port 1 View  Result Date: 09/29/2023 CLINICAL DATA:  67 year old female with possible sepsis. EXAM: PORTABLE CHEST 1 VIEW COMPARISON:  Chest x-ray 08/30/2023. FINDINGS: Diffuse interstitial prominence, peribronchial cuffing and areas of bronchiectasis and architectural distortion noted throughout the lungs bilaterally, with some upward retraction of hilar structures. No definite acute consolidative airspace disease. No pleural effusions. No pneumothorax. No evidence of pulmonary edema. Heart size is normal. Upper mediastinal contours are within normal limits. IMPRESSION: 1. No definite acute findings. Chronic lung changes, as above, similar to the prior study. These are nonspecific and could reflect areas of chronic post infectious or inflammatory scarring, or could be related to a systemic disease such as sarcoidosis. Electronically Signed   By: Trudie Reed M.D.   On: 09/29/2023 14:06    Microbiology: Results for orders placed or performed during the hospital encounter of 10/20/23  Blood Culture (routine x 2)     Status: None (Preliminary result)   Collection Time: 10/20/23  6:28 PM   Specimen: BLOOD  Result Value Ref Range Status   Specimen Description BLOOD BLOOD LEFT HAND  Final   Special Requests   Final    BOTTLES DRAWN AEROBIC AND ANAEROBIC Blood Culture adequate volume   Culture   Final    NO GROWTH 3 DAYS Performed at [facility], [address] [place], [place] [address]    Report Status PENDING  Incomplete  Blood Culture (routine x 2)     Status: None (Preliminary result)   Collection  Time: 10/20/23  6:39 PM   Specimen: BLOOD LEFT FOREARM  Result Value Ref Range Status   Specimen Description BLOOD LEFT FOREARM  Final   Special Requests   Final    BOTTLES DRAWN AEROBIC AND ANAEROBIC Blood Culture adequate volume   Culture   Final    NO GROWTH 3 DAYS Performed at [facility], [address] [place], [place] [address]    Report Status  PENDING  Incomplete    Labs: CBC: Recent Labs  Lab 10/20/23 1828 10/21/23 0450 10/23/23 0434  WBC 15.2* 15.8* 6.2  NEUTROABS 12.5*  --   --   HGB 10.2* 9.0* 10.3*  HCT 34.4* 29.6* 33.5*  MCV 83.7 81.5 80.1  PLT 453* 435* 467*   Basic Metabolic Panel: Recent Labs  Lab 10/20/23 1828 10/21/23 0450 10/23/23 0434  NA 134* 141 138  K 3.6 3.3* 3.4*  CL 101 109 99  CO2 21* 26 30  GLUCOSE 150* 85 77  BUN 26* 21 9  CREATININE 3.04* 1.98* 1.08*  CALCIUM 8.4* 8.4* 8.4*   Liver Function Tests: No results for input(s): "AST", "ALT", "ALKPHOS", "BILITOT", "PROT", "ALBUMIN" in the last 168 hours. CBG: Recent Labs  Lab 10/23/23 0752  GLUCAP 78    Discharge time spent: 36 minutes.  Signed: Marcelino Duster, MD [facility] 10/23/2023

## 2023-10-23 NOTE — Plan of Care (Signed)

## 2023-10-24 ENCOUNTER — Telehealth: Payer: Self-pay

## 2023-10-24 NOTE — Transitions of Care (Post Inpatient/ED Visit) (Signed)
10/24/2023  Name: Jean Gomez MRN: [id number] DOB: [birth year]  Today's TOC FU Call Status: Today's TOC FU Call Status:: Successful TOC FU Call Completed TOC FU Call Complete Date: 10/24/23 Patient's Name and Date of Birth confirmed.  Transition Care Management Follow-up Telephone Call Date of Discharge: 10/23/23 Discharge Facility: [facility]) Type of Discharge: Inpatient Admission Primary Inpatient Discharge Diagnosis:: Sepsis How have you been since you were released from the hospital?: Better Any questions or concerns?: No  Items Reviewed: Did you receive and understand the discharge instructions provided?: Yes Medications obtained,verified, and reconciled?: Yes (Medications Reviewed) Any new allergies since your discharge?: No Dietary orders reviewed?: NA Do you have support at home?: Yes People in Home: child(ren), adult Name of Support/Comfort Primary Source: Tharon Aquas  Medications Reviewed Today: Medications Reviewed Today     Reviewed by Redge Gainer, RN (Case Manager) on 10/24/23 at 1037  Med List Status: <None>   Medication Order Taking? Sig Documenting Provider Last Dose Status Informant  albuterol (PROVENTIL) (2.5 MG/3ML) 0.083% nebulizer solution [id number] No Take 2.5 mg by nebulization every 6 (six) hours as needed for wheezing or shortness of breath. [provider] prn prn Active Self  Albuterol Sulfate 108 (90 Base) MCG/ACT AEPB [id number] No Inhale 2 puffs into the lungs every 6 (six) hours as needed. [provider] prn prn Active Self  ALPRAZolam (XANAX) 0.25 MG tablet [id number] No Take 0.25 mg by mouth at bedtime as needed for sleep. [provider] prn prn Active Self  amLODipine-olmesartan (AZOR) 5-40 MG tablet [id number] No TAKE 1 TABLET BY MOUTH EVERY MORNING Sherron Monday, MD 10/20/2023 Unknown Active Self  aspirin EC 81 MG tablet [id number] No Take 1 tablet (81 mg total) by mouth  daily. Clapacs, Jackquline Denmark, MD 10/20/2023 Unknown Active Self  atorvastatin (LIPITOR) 80 MG tablet [id number]  TAKE 1 TABLET BY MOUTH NIGHTLY Tejan-[name], S Ahmed, MD  Active   calcium carbonate (OS-CAL) 1250 (500 Ca) MG chewable tablet [id number] No Chew 1 tablet by mouth daily. [provider] 10/20/2023 Unknown Active Self  ciprofloxacin (CIPRO) 500 MG tablet [id number]  Take 1 tablet (500 mg total) by mouth 2 (two) times daily for 3 days. Marcelino Duster, MD  Active   citalopram (CELEXA) 40 MG tablet [id number] No TAKE ONE TABLET BY MOUTH EVERY MORNING Sherron Monday, MD 10/20/2023 Unknown Active Self  dicyclomine (BENTYL) 20 MG tablet [id number] No Take 1 tablet (20 mg total) by mouth every 6 (six) hours as needed for up to 7 days for spasms. Trinna Post, MD prn Expired 09/30/23 2359 Self  diphenhydrAMINE (BENADRYL) 25 MG tablet [id number] No Take 25 mg by mouth every 6 (six) hours as needed. [provider] prn prn Active Self  donepezil (ARICEPT) 10 MG tablet [id number] No TAKE ONE TABLET BY MOUTH EVERY MORNING Sherron Monday, MD 10/20/2023 am Active Self  Elastic Bandages & Supports (MEDICAL COMPRESSION STOCKINGS) MISC [id number] No Please provide compression stockings Minna Antis, MD Taking Active Self  Fluticasone Furoate (ARNUITY ELLIPTA) 100 MCG/ACT AEPB [id number] No INHALE 1 INHALATION INTO THE LUNGS ONCE DAILY. Sherron Monday, MD 10/20/2023 am Active Self  hydrochlorothiazide (HYDRODIURIL) 25 MG tablet [id number] No TAKE 1 TABLET BY MOUTH EVERY MORNING Sherron Monday, MD 10/20/2023 am Active Self  lactulose (CHRONULAC) 10 GM/15ML solution [id number] No Take 10 g by mouth 2 (two) times daily as needed for mild constipation. [provider] prn prn Active Self  levETIRAcetam (KEPPRA) 750  MG tablet [id number] No TAKE ONE TABLET BY MOUTH EVERY MORNING Sherron Monday, MD 10/20/2023 am Active Self  LINZESS 290 MCG CAPS capsule [id number] No TAKE 1 CAPSULE  BY MOUTH EVERY MORNING Sherron Monday, MD 10/20/2023 am Active Self  mirtazapine (REMERON) 15 MG tablet [id number] No Take 1 tablet (15 mg total) by mouth at bedtime. Sherron Monday, MD 10/19/2023 pm Active Self  nitroGLYCERIN (NITROSTAT) 0.4 MG SL tablet [id number] No Place 0.4 mg under the tongue every 5 (five) minutes as needed for chest pain. [provider] prn prn Active Self  ondansetron (ZOFRAN-ODT) 4 MG disintegrating tablet [id number]  Take 1 tablet (4 mg total) by mouth every 8 (eight) hours as needed for nausea or vomiting. Marcelino Duster, MD  Active   Oxycodone HCl 10 MG TABS [id number] No Take 10 mg by mouth 3 (three) times daily as needed. [provider] prn prn Active Self  pantoprazole (PROTONIX) 40 MG tablet [id number] No Take 1 tablet (40 mg total) by mouth 2 (two) times daily. Charise Killian, MD 10/20/2023 am Active Self  potassium chloride SA (KLOR-CON M) 20 MEQ tablet [id number] No TAKE ONE TABLET BY MOUTH EVERY MORNING Sherron Monday, MD 10/20/2023 am Active Self  promethazine (PHENERGAN) 25 MG tablet [id number] No Take 1 tablet (25 mg total) by mouth every 6 (six) hours as needed for nausea or vomiting. Sherron Monday, MD prn prn Expired 10/20/23 2359 Self  sucralfate (CARAFATE) 1 g tablet [id number] No TAKE ONE TABLET BY MOUTH FOUR TIMES DAILY BEFORE MEALS AND AT BEDTIME  Patient not taking: Reported on 10/20/2023   Orson Eva, NP Not Taking Active Self  Suvorexant (BELSOMRA) 20 MG TABS [id number] No TAKE ONE TABLET BY MOUTH AT BEDTIME Sherron Monday, MD 10/19/2023 pm Active Self  tiZANidine (ZANAFLEX) 4 MG tablet [id number] No Take 1 tablet (4 mg total) by mouth 3 (three) times daily.  Patient taking differently: Take 4 mg by mouth 3 (three) times daily as needed.   Delano Metz, MD prn prn Expired 10/20/23 2359 Self  Med List Note Valerie Salts, RN 06/17/20 1211): MR 09-27-2020 Med agreement signed 02-25-2018 UDS 06-17-2020             Home Care and Equipment/Supplies: Were Home Health Services Ordered?: NA Any new equipment or medical supplies ordered?: NA  Functional Questionnaire: Do you need assistance with bathing/showering or dressing?: No Do you need assistance with meal preparation?: No Do you need assistance with eating?: No Do you have difficulty maintaining continence: No Do you need assistance with getting out of bed/getting out of a chair/moving?: No Do you have difficulty managing or taking your medications?: No  Follow up appointments reviewed: PCP Follow-up appointment confirmed?: Yes Date of PCP follow-up appointment?: 10/30/23 Follow-up Provider: Tasia Catchings [facility] Follow-up appointment confirmed?: NA Do you need transportation to your follow-up appointment?: No Do you understand care options if your condition(s) worsen?: Yes-patient verbalized understanding  SDOH Interventions Today    Flowsheet Row Most Recent Value  SDOH Interventions   Food Insecurity Interventions Intervention Not Indicated  Transportation Interventions Intervention Not Indicated      The patient states she is independent at home. She stated that this was just a bad urinary tract infection and that she is fine. She declines the 30 day Outreach program  Deidre Ala, Programmer, systems [facility] [phone]

## 2023-10-25 LAB — CULTURE, BLOOD (ROUTINE X 2)
Culture: NO GROWTH
Culture: NO GROWTH
Special Requests: ADEQUATE
Special Requests: ADEQUATE

## 2023-10-27 ENCOUNTER — Other Ambulatory Visit: Payer: Self-pay | Admitting: Internal Medicine

## 2023-10-28 DIAGNOSIS — J449 Chronic obstructive pulmonary disease, unspecified: Secondary | ICD-10-CM | POA: Diagnosis not present

## 2023-10-30 ENCOUNTER — Other Ambulatory Visit: Payer: Self-pay | Admitting: Internal Medicine

## 2023-10-30 ENCOUNTER — Ambulatory Visit (INDEPENDENT_AMBULATORY_CARE_PROVIDER_SITE_OTHER): Payer: 59 | Admitting: Internal Medicine

## 2023-10-30 ENCOUNTER — Encounter: Payer: Self-pay | Admitting: Internal Medicine

## 2023-10-30 VITALS — BP 108/82 | HR 106 | Ht 63.0 in | Wt 160.4 lb

## 2023-10-30 DIAGNOSIS — R5381 Other malaise: Secondary | ICD-10-CM

## 2023-10-30 DIAGNOSIS — N390 Urinary tract infection, site not specified: Secondary | ICD-10-CM

## 2023-10-30 DIAGNOSIS — N179 Acute kidney failure, unspecified: Secondary | ICD-10-CM

## 2023-10-30 DIAGNOSIS — R197 Diarrhea, unspecified: Secondary | ICD-10-CM | POA: Diagnosis not present

## 2023-10-30 DIAGNOSIS — A4150 Gram-negative sepsis, unspecified: Secondary | ICD-10-CM | POA: Diagnosis not present

## 2023-10-30 DIAGNOSIS — D638 Anemia in other chronic diseases classified elsewhere: Secondary | ICD-10-CM | POA: Diagnosis not present

## 2023-10-30 DIAGNOSIS — F5101 Primary insomnia: Secondary | ICD-10-CM

## 2023-10-30 DIAGNOSIS — Z0130 Encounter for examination of blood pressure without abnormal findings: Secondary | ICD-10-CM

## 2023-10-30 MED ORDER — BELSOMRA 20 MG PO TABS: 1.0000 | ORAL_TABLET | Freq: Every day | ORAL | 3 refills | Status: DC

## 2023-10-30 MED ORDER — ACCRUFER 30 MG PO CAPS: 1.0000 | ORAL_CAPSULE | Freq: Two times a day (BID) | ORAL | 0 refills | Status: AC

## 2023-10-30 MED ORDER — ARNUITY ELLIPTA 100 MCG/ACT IN AEPB: 1.0000 | INHALATION_SPRAY | Freq: Every day | RESPIRATORY_TRACT | 2 refills | Status: DC

## 2023-10-30 MED ORDER — PANTOPRAZOLE SODIUM 40 MG PO TBEC: 40.0000 mg | DELAYED_RELEASE_TABLET | Freq: Two times a day (BID) | ORAL | 0 refills | Status: DC

## 2023-10-30 NOTE — Progress Notes (Signed)
Established Patient Office Visit  Subjective:  Patient ID: Jean Gomez, female    DOB: Feb 04, 1956  Age: 67 y.o. MRN: [id number]  Chief Complaint  Patient presents with   Hospitalization Follow-up    Hospital follow up    Hospital follow up, c/o two wk h/o diarrhea following discharge. Of note completed cipro as an outpatient and also c/o lower abdominal pain.  No other concerns at this time.   Past Medical History:  Diagnosis Date   Acute postoperative pain 01/16/2019   Allergy    Asthma    Cerebral aneurysm    Chest pain 11/01/2016   Chronic migraine    Migraine   Chronic pain syndrome    Chronic respiratory failure (HCC)    Constipation    COPD (chronic obstructive pulmonary disease) (HCC)    Depression    major depression in remission   Hypercholesterolemia    Hypertension    Osteoarthritis    Pneumonia    12/2018   Sarcoid    Seizures (HCC)    Sleep apnea     Past Surgical History:  Procedure Laterality Date   ABDOMINAL HYSTERECTOMY     BACK SURGERY     spine fusion anterior lumbar with open posterior   BIOPSY  07/02/2023   Procedure: BIOPSY;  Surgeon: Regis Bill, MD;  Location: [facility] ENDOSCOPY;  Service: Endoscopy;;   CHOLECYSTECTOMY     COLONOSCOPY WITH PROPOFOL N/A 08/19/2019   Procedure: COLONOSCOPY WITH PROPOFOL;  Surgeon: Christena Deem, MD;  Location: [facility] ENDOSCOPY;  Service: Endoscopy;  Laterality: N/A;   ESOPHAGOGASTRODUODENOSCOPY (EGD) WITH PROPOFOL N/A 08/19/2019   Procedure: ESOPHAGOGASTRODUODENOSCOPY (EGD) WITH PROPOFOL;  Surgeon: Christena Deem, MD;  Location: [facility] ENDOSCOPY;  Service: Endoscopy;  Laterality: N/A;   ESOPHAGOGASTRODUODENOSCOPY (EGD) WITH PROPOFOL N/A 08/04/2020   Procedure: ESOPHAGOGASTRODUODENOSCOPY (EGD) WITH PROPOFOL;  Surgeon: Earline Mayotte, MD;  Location: [facility] ENDOSCOPY;  Service: Endoscopy;  Laterality: N/A;   ESOPHAGOGASTRODUODENOSCOPY (EGD) WITH PROPOFOL N/A 11/05/2020   Procedure:  ESOPHAGOGASTRODUODENOSCOPY (EGD) WITH PROPOFOL;  Surgeon: Earline Mayotte, MD;  Location: [facility] ENDOSCOPY;  Service: Endoscopy;  Laterality: N/A;   ESOPHAGOGASTRODUODENOSCOPY (EGD) WITH PROPOFOL N/A 07/02/2023   Procedure: ESOPHAGOGASTRODUODENOSCOPY (EGD) WITH PROPOFOL;  Surgeon: Regis Bill, MD;  Location: [facility] ENDOSCOPY;  Service: Endoscopy;  Laterality: N/A;   ESOPHAGOGASTRODUODENOSCOPY (EGD) WITH PROPOFOL N/A 10/01/2023   Procedure: ESOPHAGOGASTRODUODENOSCOPY (EGD) WITH PROPOFOL;  Surgeon: Midge Minium, MD;  Location: [facility] ENDOSCOPY;  Service: Endoscopy;  Laterality: N/A;   SPINAL FUSION      Social History   Socioeconomic History   Marital status: Single    Spouse name: Not on file   Number of children: Not on file   Years of education: Not on file   Highest education level: Not on file  Occupational History   Not on file  Tobacco Use   Smoking status: Never   Smokeless tobacco: Never  Vaping Use   Vaping status: Never Used  Substance and Sexual Activity   Alcohol use: Yes    Comment: rarely   Drug use: No   Sexual activity: Not on file  Other Topics Concern   Not on file  Social History Narrative   Not on file   Social Determinants of Health   Financial Resource Strain: Low Risk  (03/05/2018)   Overall Financial Resource Strain (CARDIA)    Difficulty of Paying Living Expenses: Not very hard  Food Insecurity: No Food Insecurity (10/24/2023)   Hunger Vital Sign  Worried About Programme researcher, broadcasting/film/video in the Last Year: Never true    Ran Out of Food in the Last Year: Never true  Transportation Needs: No Transportation Needs (10/24/2023)   PRAPARE - Administrator, Civil Service (Medical): No    Lack of Transportation (Non-Medical): No  Physical Activity: Unknown (03/05/2018)   Exercise Vital Sign    Days of Exercise per Week: Patient declined    Minutes of Exercise per Session: Patient declined  Stress: No Stress Concern Present (03/05/2018)   [employer] of Occupational Health - Occupational Stress Questionnaire    Feeling of Stress : Only a little  Social Connections: Unknown (03/05/2018)   Social Connection and Isolation Panel [NHANES]    Frequency of Communication with Friends and Family: Patient declined    Frequency of Social Gatherings with Friends and Family: Patient declined    Attends Religious Services: Patient declined    Database administrator or Organizations: Patient declined    Attends Banker Meetings: Patient declined    Marital Status: Patient declined  Intimate Partner Violence: Not At Risk (10/22/2023)   Humiliation, Afraid, Rape, and Kick questionnaire    Fear of Current or Ex-Partner: No    Emotionally Abused: No    Physically Abused: No    Sexually Abused: No    Family History  Problem Relation Age of Onset   Dementia Mother    Breast cancer Mother    Throat cancer Father     Allergies  Allergen Reactions   Penicillins Rash, Hives and Itching   Contrast Media [Iodinated Contrast Media] Swelling   Gabapentin    Labetalol Other (See Comments)    Made hair fall out   Lactose Intolerance (Gi)     Bloating and diarrhea    Sulfabenzamide Nausea Only   Pregabalin Other (See Comments)    The patient complained of hemifacial numbness with the use of the medication.    Review of Systems  Constitutional: Negative.   HENT: Negative.    Eyes: Negative.   Respiratory: Negative.    Cardiovascular:  Positive for palpitations.  Gastrointestinal:  Positive for diarrhea.  Genitourinary: Negative.   Skin: Negative.   Neurological: Negative.   Endo/Heme/Allergies: Negative.   Psychiatric/Behavioral:  The patient has insomnia.        Objective:   BP 108/82   Pulse (!) 106   Ht 5\' 3"  (1.6 m)   Wt 160 lb 6.4 oz (72.8 kg)   SpO2 100% Comment: 2L  BMI 28.41 kg/m   Vitals:   10/30/23 1329  BP: 108/82  Pulse: (!) 106  Height: 5\' 3"  (1.6 m)  Weight: 160 lb 6.4 oz (72.8 kg)  SpO2:  100% Comment: 2L  BMI (Calculated): 28.42    Physical Exam Vitals reviewed.  Constitutional:      General: She is not in acute distress. HENT:     Head: Normocephalic.     Nose: Nose normal.     Mouth/Throat:     Mouth: Mucous membranes are moist.  Eyes:     Extraocular Movements: Extraocular movements intact.     Pupils: Pupils are equal, round, and reactive to light.  Cardiovascular:     Rate and Rhythm: Normal rate and regular rhythm.     Heart sounds: No murmur heard. Pulmonary:     Effort: Pulmonary effort is normal.     Breath sounds: No rhonchi or rales.  Abdominal:     General: Abdomen is  flat.     Palpations: There is no hepatomegaly, splenomegaly or mass.  Musculoskeletal:        General: Normal range of motion.     Cervical back: Normal range of motion. No tenderness.     Right lower leg: 1+ Pitting Edema present.     Left lower leg: 1+ Pitting Edema present.  Skin:    General: Skin is warm and dry.  Neurological:     General: No focal deficit present.     Mental Status: She is alert and oriented to person, place, and time.     Cranial Nerves: No cranial nerve deficit.     Motor: No weakness.  Psychiatric:        Mood and Affect: Mood normal.        Behavior: Behavior normal.      No results found for any visits on 10/30/23.      Assessment & Plan:  As per problem list  Problem List Items Addressed This Visit       Genitourinary   AKI (acute kidney injury) (HCC)     Other   Insomnia   Relevant Medications   Suvorexant (BELSOMRA) 20 MG TABS   Diarrhea - Primary   Relevant Orders   Clostridium difficile EIA   Anemia of chronic disease   Relevant Medications   Ferric Maltol (ACCRUFER) 30 MG CAPS   Sepsis due to gram-negative UTI (HCC)   Other Visit Diagnoses     Physical debility           Return if symptoms worsen or fail to improve, keep appt.   Total time spent: 30 minutes  Luna Fuse, MD  10/30/2023   This  document may have been prepared by [facility] Voice Recognition software and as such may include unintentional dictation errors.

## 2023-10-31 ENCOUNTER — Other Ambulatory Visit: Payer: Self-pay | Admitting: Internal Medicine

## 2023-10-31 DIAGNOSIS — Z1231 Encounter for screening mammogram for malignant neoplasm of breast: Secondary | ICD-10-CM

## 2023-10-31 DIAGNOSIS — R197 Diarrhea, unspecified: Secondary | ICD-10-CM | POA: Diagnosis not present

## 2023-11-02 ENCOUNTER — Other Ambulatory Visit: Payer: Self-pay | Admitting: Internal Medicine

## 2023-11-02 ENCOUNTER — Ambulatory Visit: Payer: Self-pay

## 2023-11-02 DIAGNOSIS — R197 Diarrhea, unspecified: Secondary | ICD-10-CM

## 2023-11-02 LAB — CLOSTRIDIUM DIFFICILE EIA: C difficile Toxins A+B, EIA: NEGATIVE

## 2023-11-02 MED ORDER — LOPERAMIDE HCL 2 MG PO CAPS
2.0000 mg | ORAL_CAPSULE | ORAL | 0 refills | Status: AC | PRN
Start: 2023-11-02 — End: 2023-11-09

## 2023-11-02 NOTE — Patient Instructions (Signed)
Visit Information  Thank you for taking time to visit with me today. Please don't hesitate to contact me if I can be of assistance to you.   Following are the goals we discussed today:   Goals Addressed             This Visit's Progress    Continued improvement post hospitalization and ongoing management of health conditions.       Interventions Today    Flowsheet Row Most Recent Value  Chronic Disease   Chronic disease during today's visit Other  [gastric ulcer/ UTI]  General Interventions   General Interventions Discussed/Reviewed General Interventions Reviewed, Doctor Visits  [evaluation of current treatment plan for gastric ulcer/ UTI and patients adherence to plan as established by provider.  Assessed for ongoing UTI  / gastric symptoms.]  Doctor Visits Discussed/Reviewed Doctor Visits Reviewed  [facility] patient has follow up appointment with gastroenterologist.  Advised to keep follow up appointments as recommended.]  Education Interventions   Education Provided Provided Education  [Advised to drink plenty of fluids daily, empty bladder frequently, practice good hygiene, wipe front to back to prevent UTI.  Advised to notify provider for UTI symptoms.]  Pharmacy Interventions   Pharmacy Dicussed/Reviewed Pharmacy Topics Reviewed  [Confirmed patient completed prescribed antibiotics.]              Our next appointment is by telephone on 12/07/23 at 10 am  Please call the care guide team at [phone] if you need to cancel or reschedule your appointment.   If you are experiencing a Mental Health or Behavioral Health Crisis or need someone to talk to, please call the Suicide and Crisis Lifeline: 988 call 1-800-273-TALK (toll free, 24 hour hotline)  The patient verbalized understanding of instructions, educational materials, and care plan provided today and agreed to receive a mailed copy of patient instructions, educational materials, and care plan.   George Ina  RN,BSN,CCM [place]  [facility], [facility] coordinator / Case Manager Phone: [phone]

## 2023-11-02 NOTE — Patient Outreach (Signed)
  Care Coordination   Follow Up Visit Note   11/02/2023 Name: Jean Gomez MRN: [id number] DOB: 1956/10/04  Jean Gomez is a 67 y.o. year old female who sees Sherron Monday, MD for primary care. I spoke with  Jean Gomez by phone today.  What matters to the patients health and wellness today?  Patient states she is doing well.  She reports having post hospital follow up visit with her primary care provider.  She denies any further UTI symptoms and reports completing antibiotics.   Patient denies any abdominal/ gastric discomfort/ pain.  She states she continues to have diarrhea.  Patient states she is waiting to hear back about recent testing due to the diarrhea and further treatment.  Patient states she is doing fine and would like to have monthly follow call with RNCM and not weekly.    Goals Addressed             This Visit's Progress    Continued improvement post hospitalization and ongoing management of health conditions.       Interventions Today    Flowsheet Row Most Recent Value  Chronic Disease   Chronic disease during today's visit Other  [gastric ulcer/ UTI]  General Interventions   General Interventions Discussed/Reviewed General Interventions Reviewed, Doctor Visits  [evaluation of current treatment plan for gastric ulcer/ UTI and patients adherence to plan as established by provider.  Assessed for ongoing UTI  / gastric symptoms.]  Doctor Visits Discussed/Reviewed Doctor Visits Reviewed  [facility] patient has follow up appointment with gastroenterologist.  Advised to keep follow up appointments as recommended.]  Education Interventions   Education Provided Provided Education  [Advised to drink plenty of fluids daily, empty bladder frequently, practice good hygiene, wipe front to back to prevent UTI.  Advised to notify provider for UTI symptoms.]  Pharmacy Interventions   Pharmacy Dicussed/Reviewed Pharmacy Topics Reviewed  [Confirmed patient completed  prescribed antibiotics.]              SDOH assessments and interventions completed:  No     Care Coordination Interventions:  Yes, provided   Follow up plan: Follow up call scheduled for 12/07/23    Encounter Outcome:  Patient Visit Completed   George Ina RN,BSN,CCM [facility]  [facility], [facility] coordinator / Case Manager Phone: [phone]

## 2023-11-05 ENCOUNTER — Telehealth: Payer: Self-pay

## 2023-11-05 DIAGNOSIS — M533 Sacrococcygeal disorders, not elsewhere classified: Secondary | ICD-10-CM | POA: Diagnosis not present

## 2023-11-08 DIAGNOSIS — Z79899 Other long term (current) drug therapy: Secondary | ICD-10-CM | POA: Diagnosis not present

## 2023-11-08 DIAGNOSIS — M25562 Pain in left knee: Secondary | ICD-10-CM | POA: Diagnosis not present

## 2023-11-08 DIAGNOSIS — M25511 Pain in right shoulder: Secondary | ICD-10-CM | POA: Diagnosis not present

## 2023-11-08 DIAGNOSIS — M545 Low back pain, unspecified: Secondary | ICD-10-CM | POA: Diagnosis not present

## 2023-11-08 DIAGNOSIS — G894 Chronic pain syndrome: Secondary | ICD-10-CM | POA: Diagnosis not present

## 2023-11-08 DIAGNOSIS — J9611 Chronic respiratory failure with hypoxia: Secondary | ICD-10-CM | POA: Diagnosis not present

## 2023-11-08 DIAGNOSIS — G8929 Other chronic pain: Secondary | ICD-10-CM | POA: Diagnosis not present

## 2023-11-08 DIAGNOSIS — J449 Chronic obstructive pulmonary disease, unspecified: Secondary | ICD-10-CM | POA: Diagnosis not present

## 2023-11-08 DIAGNOSIS — M7062 Trochanteric bursitis, left hip: Secondary | ICD-10-CM | POA: Diagnosis not present

## 2023-11-08 DIAGNOSIS — M25561 Pain in right knee: Secondary | ICD-10-CM | POA: Diagnosis not present

## 2023-11-13 ENCOUNTER — Other Ambulatory Visit: Payer: Self-pay | Admitting: Internal Medicine

## 2023-11-20 ENCOUNTER — Ambulatory Visit: Payer: 59 | Admitting: Internal Medicine

## 2023-11-20 ENCOUNTER — Other Ambulatory Visit: Payer: Self-pay | Admitting: Internal Medicine

## 2023-11-21 ENCOUNTER — Other Ambulatory Visit: Payer: Self-pay | Admitting: Internal Medicine

## 2023-11-23 ENCOUNTER — Ambulatory Visit
Admission: RE | Admit: 2023-11-23 | Discharge: 2023-11-23 | Disposition: A | Discharge status: Home / Self Care | Payer: 59 | Source: Ambulatory Visit | Attending: Internal Medicine | Admitting: Internal Medicine

## 2023-11-23 DIAGNOSIS — Z1231 Encounter for screening mammogram for malignant neoplasm of breast: Secondary | ICD-10-CM | POA: Diagnosis not present

## 2023-11-27 DIAGNOSIS — G4733 Obstructive sleep apnea (adult) (pediatric): Secondary | ICD-10-CM | POA: Diagnosis not present

## 2023-11-27 DIAGNOSIS — D869 Sarcoidosis, unspecified: Secondary | ICD-10-CM | POA: Diagnosis not present

## 2023-11-27 DIAGNOSIS — J439 Emphysema, unspecified: Secondary | ICD-10-CM | POA: Diagnosis not present

## 2023-11-27 DIAGNOSIS — Z9981 Dependence on supplemental oxygen: Secondary | ICD-10-CM | POA: Diagnosis not present

## 2023-11-27 DIAGNOSIS — J449 Chronic obstructive pulmonary disease, unspecified: Secondary | ICD-10-CM | POA: Diagnosis not present

## 2023-11-29 ENCOUNTER — Other Ambulatory Visit: Payer: Self-pay | Admitting: Internal Medicine

## 2023-12-07 ENCOUNTER — Ambulatory Visit: Payer: Self-pay

## 2023-12-07 NOTE — Patient Instructions (Signed)
Visit Information  Thank you for taking time to visit with me today.   What we discussed today:   Goals Addressed             This Visit's Progress    COMPLETED: Continued improvement post hospitalization and ongoing management of health conditions.       Interventions Today    Flowsheet Row Most Recent Value  Chronic Disease   Chronic disease during today's visit Other  [gastric ulcer/ UTI, chronic buttock pain]  General Interventions   General Interventions Discussed/Reviewed General Interventions Reviewed, Doctor Visits  [evaluation of current treatment plan for mentioned health conditions and patients adherence to plan as established by provider. Assessed for new or ongoing symptoms.]  Doctor Visits Discussed/Reviewed Doctor Visits Reviewed  [name] upcoming provider visits. Advised to keep follow up visits with providers as recommended.  Advised to contact provider if care coordination/ case management services needed in the future.]  Education Interventions   Education Provided Provided Education  [Advised to notify provider for any new or ongoing symptoms.]  Pharmacy Interventions   Pharmacy Dicussed/Reviewed Pharmacy Topics Reviewed  [name] to take medications as prescribed.]               Please contact your primary care provider if you feel care coordination/ case management services are needed in the future.   If you are experiencing a Mental Health or Behavioral Health Crisis or need someone to talk to, please call the Suicide and Crisis Lifeline: 988 call 1-800-273-TALK (toll free, 24 hour hotline)  The patient verbalized understanding of instructions, educational materials, and care plan provided today and agreed to receive a mailed copy of patient instructions, educational materials, and care plan.   George Ina RN,BSN,CCM [place]  [facility], [facility] coordinator / Case Manager Phone: [phone]

## 2023-12-07 NOTE — Patient Outreach (Signed)
  Care Coordination   Follow Up Visit Note   12/07/2023 Name: Jean Gomez MRN: [id number] DOB: [birth year]  Jean Gomez is a 67 y.o. year old female who sees Sherron Monday, MD for primary care. I spoke with  [name] by phone today.  What matters to the patients health and wellness today?  Patient states she is doing fine.  She denies having any further chest pain symptoms. She reports chronic buttock pain has resolved.  Patient reports having recent follow up visit with pulmonologist.   Patient states she does not need additional follow up with RNCM at this time.  Agreeable that care coordination goals have been met.    Goals Addressed             This Visit's Progress    COMPLETED: Continued improvement post hospitalization and ongoing management of health conditions.       Interventions Today    Flowsheet Row Most Recent Value  Chronic Disease   Chronic disease during today's visit Other  [gastric ulcer/ UTI, chronic buttock pain]  General Interventions   General Interventions Discussed/Reviewed General Interventions Reviewed, Doctor Visits  [evaluation of current treatment plan for mentioned health conditions and patients adherence to plan as established by provider. Assessed for new or ongoing symptoms.]  Doctor Visits Discussed/Reviewed Doctor Visits Reviewed  [name] upcoming provider visits. Advised to keep follow up visits with providers as recommended.  Advised to contact provider if care coordination/ case management services needed in the future.]  Education Interventions   Education Provided Provided Education  [Advised to notify provider for any new or ongoing symptoms.]  Pharmacy Interventions   Pharmacy Dicussed/Reviewed Pharmacy Topics Reviewed  [name] to take medications as prescribed.]               SDOH assessments and interventions completed:  No     Care Coordination Interventions:  Yes, provided   Follow up plan: No  further intervention required.   Encounter Outcome:  Patient Visit Completed   George Ina RN,BSN,CCM [facility]  [facility], [facility] coordinator / Case Manager Phone: [phone]

## 2023-12-13 DIAGNOSIS — G8929 Other chronic pain: Secondary | ICD-10-CM | POA: Diagnosis not present

## 2023-12-13 DIAGNOSIS — J449 Chronic obstructive pulmonary disease, unspecified: Secondary | ICD-10-CM | POA: Diagnosis not present

## 2023-12-13 DIAGNOSIS — J9611 Chronic respiratory failure with hypoxia: Secondary | ICD-10-CM | POA: Diagnosis not present

## 2023-12-13 DIAGNOSIS — Z79899 Other long term (current) drug therapy: Secondary | ICD-10-CM | POA: Diagnosis not present

## 2023-12-13 DIAGNOSIS — M25511 Pain in right shoulder: Secondary | ICD-10-CM | POA: Diagnosis not present

## 2023-12-13 DIAGNOSIS — M25562 Pain in left knee: Secondary | ICD-10-CM | POA: Diagnosis not present

## 2023-12-13 DIAGNOSIS — M545 Low back pain, unspecified: Secondary | ICD-10-CM | POA: Diagnosis not present

## 2023-12-13 DIAGNOSIS — M7062 Trochanteric bursitis, left hip: Secondary | ICD-10-CM | POA: Diagnosis not present

## 2023-12-13 DIAGNOSIS — M25561 Pain in right knee: Secondary | ICD-10-CM | POA: Diagnosis not present

## 2023-12-20 ENCOUNTER — Other Ambulatory Visit: Payer: Self-pay | Admitting: Internal Medicine

## 2023-12-21 ENCOUNTER — Ambulatory Visit: Payer: 59 | Admitting: Internal Medicine

## 2023-12-28 DIAGNOSIS — J449 Chronic obstructive pulmonary disease, unspecified: Secondary | ICD-10-CM | POA: Diagnosis not present

## 2024-01-12 DIAGNOSIS — J449 Chronic obstructive pulmonary disease, unspecified: Secondary | ICD-10-CM | POA: Diagnosis not present

## 2024-01-12 DIAGNOSIS — M25561 Pain in right knee: Secondary | ICD-10-CM | POA: Diagnosis not present

## 2024-01-12 DIAGNOSIS — M25562 Pain in left knee: Secondary | ICD-10-CM | POA: Diagnosis not present

## 2024-01-12 DIAGNOSIS — M7062 Trochanteric bursitis, left hip: Secondary | ICD-10-CM | POA: Diagnosis not present

## 2024-01-12 DIAGNOSIS — J9611 Chronic respiratory failure with hypoxia: Secondary | ICD-10-CM | POA: Diagnosis not present

## 2024-01-12 DIAGNOSIS — G8929 Other chronic pain: Secondary | ICD-10-CM | POA: Diagnosis not present

## 2024-01-12 DIAGNOSIS — M545 Low back pain, unspecified: Secondary | ICD-10-CM | POA: Diagnosis not present

## 2024-01-12 DIAGNOSIS — M25511 Pain in right shoulder: Secondary | ICD-10-CM | POA: Diagnosis not present

## 2024-01-21 ENCOUNTER — Other Ambulatory Visit: Payer: Self-pay | Admitting: Internal Medicine

## 2024-01-28 DIAGNOSIS — J449 Chronic obstructive pulmonary disease, unspecified: Secondary | ICD-10-CM | POA: Diagnosis not present

## 2024-01-29 DIAGNOSIS — H02401 Unspecified ptosis of right eyelid: Secondary | ICD-10-CM | POA: Diagnosis not present

## 2024-01-31 ENCOUNTER — Other Ambulatory Visit: Payer: Self-pay | Admitting: Internal Medicine

## 2024-01-31 DIAGNOSIS — H02401 Unspecified ptosis of right eyelid: Secondary | ICD-10-CM | POA: Diagnosis not present

## 2024-02-05 ENCOUNTER — Ambulatory Visit
Admission: RE | Admit: 2024-02-05 | Discharge: 2024-02-05 | Disposition: A | Discharge status: Home / Self Care | Payer: 59 | Source: Ambulatory Visit | Attending: Internal Medicine | Admitting: Internal Medicine

## 2024-02-05 ENCOUNTER — Ambulatory Visit
Admission: RE | Admit: 2024-02-05 | Discharge: 2024-02-05 | Disposition: A | Discharge status: Home / Self Care | Payer: 59 | Attending: Internal Medicine | Admitting: Internal Medicine

## 2024-02-05 ENCOUNTER — Encounter: Payer: Self-pay | Admitting: Internal Medicine

## 2024-02-05 ENCOUNTER — Ambulatory Visit (INDEPENDENT_AMBULATORY_CARE_PROVIDER_SITE_OTHER): Payer: 59 | Admitting: Internal Medicine

## 2024-02-05 VITALS — BP 170/110 | HR 117 | Temp 99.8°F | Ht 63.0 in | Wt 174.0 lb

## 2024-02-05 DIAGNOSIS — J208 Acute bronchitis due to other specified organisms: Secondary | ICD-10-CM

## 2024-02-05 DIAGNOSIS — J479 Bronchiectasis, uncomplicated: Secondary | ICD-10-CM | POA: Diagnosis not present

## 2024-02-05 DIAGNOSIS — R051 Acute cough: Secondary | ICD-10-CM

## 2024-02-05 DIAGNOSIS — R059 Cough, unspecified: Secondary | ICD-10-CM | POA: Diagnosis not present

## 2024-02-05 DIAGNOSIS — J439 Emphysema, unspecified: Secondary | ICD-10-CM | POA: Diagnosis not present

## 2024-02-05 DIAGNOSIS — I10 Essential (primary) hypertension: Secondary | ICD-10-CM | POA: Diagnosis not present

## 2024-02-05 MED ORDER — PREDNISONE 20 MG PO TABS: 40.0000 mg | ORAL_TABLET | Freq: Every day | ORAL | 0 refills | Status: AC

## 2024-02-05 MED ORDER — DOXYCYCLINE HYCLATE 100 MG PO TABS: 100.0000 mg | ORAL_TABLET | Freq: Two times a day (BID) | ORAL | 0 refills | Status: AC

## 2024-02-05 MED ORDER — BENZONATATE 100 MG PO CAPS: 100.0000 mg | ORAL_CAPSULE | Freq: Three times a day (TID) | ORAL | 0 refills | Status: DC | PRN

## 2024-02-05 NOTE — Progress Notes (Unsigned)
Established Patient Office Visit  Subjective:  Patient ID: Jean Gomez, female    DOB: Jan 13, 1956  Age: 68 y.o. MRN: [id number]  Chief Complaint  Patient presents with  . Cough    Chest congestion, cough, X1 week    C/o 1 wk h/o cough productive of dark [place] sputum with worsening SOB and pleuritic chest pain. Unrelieved by otc antitussives.   No other concerns at this time.   Past Medical History:  Diagnosis Date  . Acute postoperative pain 01/16/2019  . Allergy   . Asthma   . Cerebral aneurysm   . Chest pain 11/01/2016  . Chronic migraine    Migraine  . Chronic pain syndrome   . Chronic respiratory failure (HCC)   . Constipation   . COPD (chronic obstructive pulmonary disease) (HCC)   . Depression    major depression in remission  . Hypercholesterolemia   . Hypertension   . Osteoarthritis   . Pneumonia    12/2018  . Sarcoid   . Seizures (HCC)   . Sleep apnea     Past Surgical History:  Procedure Laterality Date  . ABDOMINAL HYSTERECTOMY    . BACK SURGERY     spine fusion anterior lumbar with open posterior  . BIOPSY  07/02/2023   Procedure: BIOPSY;  Surgeon: Regis Bill, MD;  Location: [facility] ENDOSCOPY;  Service: Endoscopy;;  . CHOLECYSTECTOMY    . COLONOSCOPY WITH PROPOFOL N/A 08/19/2019   Procedure: COLONOSCOPY WITH PROPOFOL;  Surgeon: Christena Deem, MD;  Location: [facility] ENDOSCOPY;  Service: Endoscopy;  Laterality: N/A;  . ESOPHAGOGASTRODUODENOSCOPY (EGD) WITH PROPOFOL N/A 08/19/2019   Procedure: ESOPHAGOGASTRODUODENOSCOPY (EGD) WITH PROPOFOL;  Surgeon: Christena Deem, MD;  Location: [facility] ENDOSCOPY;  Service: Endoscopy;  Laterality: N/A;  . ESOPHAGOGASTRODUODENOSCOPY (EGD) WITH PROPOFOL N/A 08/04/2020   Procedure: ESOPHAGOGASTRODUODENOSCOPY (EGD) WITH PROPOFOL;  Surgeon: Earline Mayotte, MD;  Location: [facility] ENDOSCOPY;  Service: Endoscopy;  Laterality: N/A;  . ESOPHAGOGASTRODUODENOSCOPY (EGD) WITH PROPOFOL N/A 11/05/2020   Procedure:  ESOPHAGOGASTRODUODENOSCOPY (EGD) WITH PROPOFOL;  Surgeon: Earline Mayotte, MD;  Location: [facility] ENDOSCOPY;  Service: Endoscopy;  Laterality: N/A;  . ESOPHAGOGASTRODUODENOSCOPY (EGD) WITH PROPOFOL N/A 07/02/2023   Procedure: ESOPHAGOGASTRODUODENOSCOPY (EGD) WITH PROPOFOL;  Surgeon: Regis Bill, MD;  Location: [facility] ENDOSCOPY;  Service: Endoscopy;  Laterality: N/A;  . ESOPHAGOGASTRODUODENOSCOPY (EGD) WITH PROPOFOL N/A 10/01/2023   Procedure: ESOPHAGOGASTRODUODENOSCOPY (EGD) WITH PROPOFOL;  Surgeon: Midge Minium, MD;  Location: [facility] ENDOSCOPY;  Service: Endoscopy;  Laterality: N/A;  . SPINAL FUSION      Social History   Socioeconomic History  . Marital status: Single    Spouse name: Not on file  . Number of children: Not on file  . Years of education: Not on file  . Highest education level: Not on file  Occupational History  . Not on file  Tobacco Use  . Smoking status: Never  . Smokeless tobacco: Never  Vaping Use  . Vaping status: Never Used  Substance and Sexual Activity  . Alcohol use: Yes    Comment: rarely  . Drug use: No  . Sexual activity: Not on file  Other Topics Concern  . Not on file  Social History Narrative  . Not on file   Social Drivers of Health   Financial Resource Strain: Low Risk  (03/05/2018)   Overall Financial Resource Strain (CARDIA)   . Difficulty of Paying Living Expenses: Not very hard  Food Insecurity: No Food Insecurity (10/24/2023)   Hunger Vital Sign   .  Worried About Programme researcher, broadcasting/film/video in the Last Year: Never true   . Ran Out of Food in the Last Year: Never true  Transportation Needs: No Transportation Needs (10/24/2023)   PRAPARE - Transportation   . Lack of Transportation (Medical): No   . Lack of Transportation (Non-Medical): No  Physical Activity: Unknown (03/05/2018)   Exercise Vital Sign   . Days of Exercise per Week: Patient declined   . Minutes of Exercise per Session: Patient declined  Stress: No Stress Concern Present  (03/05/2018)   [employer] of Occupational Health - Occupational Stress Questionnaire   . Feeling of Stress : Only a little  Social Connections: Unknown (03/05/2018)   Social Connection and Isolation Panel [NHANES]   . Frequency of Communication with Friends and Family: Patient declined   . Frequency of Social Gatherings with Friends and Family: Patient declined   . Attends Religious Services: Patient declined   . Active Member of Clubs or Organizations: Patient declined   . Attends Banker Meetings: Patient declined   . Marital Status: Patient declined  Intimate Partner Violence: Not At Risk (10/22/2023)   Humiliation, Afraid, Rape, and Kick questionnaire   . Fear of Current or Ex-Partner: No   . Emotionally Abused: No   . Physically Abused: No   . Sexually Abused: No    Family History  Problem Relation Age of Onset  . Dementia Mother   . Breast cancer Mother   . Throat cancer Father     Allergies  Allergen Reactions  . Penicillins Rash, Hives and Itching  . Contrast Media [Iodinated Contrast Media] Swelling  . Gabapentin   . Labetalol Other (See Comments)    Made hair fall out  . Lactose Intolerance (Gi)     Bloating and diarrhea   . Sulfabenzamide Nausea Only  . Pregabalin Other (See Comments)    The patient complained of hemifacial numbness with the use of the medication.    Outpatient Medications Prior to Visit  Medication Sig  . albuterol (PROVENTIL) (2.5 MG/3ML) 0.083% nebulizer solution Take 2.5 mg by nebulization every 6 (six) hours as needed for wheezing or shortness of breath.  . Albuterol Sulfate 108 (90 Base) MCG/ACT AEPB Inhale 2 puffs into the lungs every 6 (six) hours as needed.  . ALPRAZolam (XANAX) 0.25 MG tablet Take 0.25 mg by mouth at bedtime as needed for sleep.  Marland Kitchen amLODipine-olmesartan (AZOR) 5-40 MG tablet TAKE 1 TABLET BY MOUTH EVERY MORNING  . aspirin EC 81 MG tablet Take 1 tablet (81 mg total) by mouth daily.  Marland Kitchen atorvastatin  (LIPITOR) 80 MG tablet TAKE 1 TABLET BY MOUTH NIGHTLY  . calcium carbonate (OS-CAL) 1250 (500 Ca) MG chewable tablet Chew 1 tablet by mouth daily.  . citalopram (CELEXA) 40 MG tablet TAKE ONE TABLET BY MOUTH EVERY MORNING  . dicyclomine (BENTYL) 20 MG tablet Take 1 tablet (20 mg total) by mouth every 6 (six) hours as needed for up to 7 days for spasms.  . diphenhydrAMINE (BENADRYL) 25 MG tablet Take 25 mg by mouth every 6 (six) hours as needed.  . donepezil (ARICEPT) 10 MG tablet TAKE ONE TABLET BY MOUTH EVERY MORNING  . Elastic Bandages & Supports (MEDICAL COMPRESSION STOCKINGS) MISC Please provide compression stockings  . Fluticasone Furoate (ARNUITY ELLIPTA) 100 MCG/ACT AEPB TAKE 1 INHALATION BY MOUTH ONCE DAILY  . hydrochlorothiazide (HYDRODIURIL) 25 MG tablet TAKE 1 TABLET BY MOUTH EVERY MORNING  . indomethacin (INDOCIN) 50 MG capsule TAKE 1  CAPSULE BY MOUTH 3 TIMES DAILY ASNEEDED FOR HEADACHES  . lactulose (CHRONULAC) 10 GM/15ML solution Take 10 g by mouth 2 (two) times daily as needed for mild constipation.  . levETIRAcetam (KEPPRA) 750 MG tablet TAKE ONE TABLET BY MOUTH EVERY MORNING  . LINZESS 290 MCG CAPS capsule TAKE 1 CAPSULE BY MOUTH EVERY MORNING  . mirtazapine (REMERON) 15 MG tablet TAKE ONE TABLET BY MOUTH AT BEDTIME  . nitroGLYCERIN (NITROSTAT) 0.4 MG SL tablet Place 0.4 mg under the tongue every 5 (five) minutes as needed for chest pain.  Marland Kitchen ondansetron (ZOFRAN-ODT) 4 MG disintegrating tablet Take 1 tablet (4 mg total) by mouth every 8 (eight) hours as needed for nausea or vomiting.  . Oxycodone HCl 10 MG TABS Take 10 mg by mouth 3 (three) times daily as needed.  . OXYGEN Inhale 2 L/min into the lungs continuous.  . pantoprazole (PROTONIX) 40 MG tablet Take 1 tablet (40 mg total) by mouth 2 (two) times daily.  . potassium chloride SA (KLOR-CON M) 20 MEQ tablet TAKE ONE TABLET BY MOUTH EVERY MORNING  . promethazine (PHENERGAN) 25 MG tablet Take 1 tablet (25 mg total) by  mouth every 6 (six) hours as needed for nausea or vomiting.  . sucralfate (CARAFATE) 1 g tablet TAKE ONE TABLET BY MOUTH FOUR TIMES DAILY BEFORE MEALS AND AT BEDTIME (Patient not taking: Reported on 10/20/2023)  . Suvorexant (BELSOMRA) 20 MG TABS Take 1 tablet (20 mg total) by mouth at bedtime.  Marland Kitchen tiZANidine (ZANAFLEX) 4 MG tablet Take 1 tablet (4 mg total) by mouth 3 (three) times daily. (Patient taking differently: Take 4 mg by mouth 3 (three) times daily as needed.)   No facility-administered medications prior to visit.    Review of Systems  Constitutional: Negative.   HENT: Negative.    Eyes: Negative.   Respiratory:  Positive for cough, sputum production, shortness of breath and wheezing.   Cardiovascular:  Positive for chest pain (pleuritic). Negative for palpitations.  Genitourinary: Negative.   Skin: Negative.   Neurological: Negative.   Endo/Heme/Allergies: Negative.   Psychiatric/Behavioral:  The patient has insomnia.        Objective:   BP (!) 170/110   Pulse (!) 117   Temp 99.8 F (37.7 C)   Ht 5\' 3"  (1.6 m)   Wt 174 lb (78.9 kg)   SpO2 91%   BMI 30.82 kg/m   Vitals:   02/05/24 1358  BP: (!) 170/110  Pulse: (!) 117  Temp: 99.8 F (37.7 C)  Height: 5\' 3"  (1.6 m)  Weight: 174 lb (78.9 kg)  SpO2: 91%  BMI (Calculated): 30.83    Physical Exam Vitals reviewed.  Constitutional:      General: She is not in acute distress. HENT:     Head: Normocephalic.     Nose: Nose normal.     Mouth/Throat:     Mouth: Mucous membranes are moist.  Eyes:     Extraocular Movements: Extraocular movements intact.     Pupils: Pupils are equal, round, and reactive to light.  Cardiovascular:     Rate and Rhythm: Normal rate and regular rhythm.     Heart sounds: No murmur heard. Pulmonary:     Effort: Pulmonary effort is normal.     Breath sounds: Rhonchi present. No rales.  Abdominal:     General: Abdomen is flat.     Palpations: There is no hepatomegaly, splenomegaly  or mass.  Musculoskeletal:        General: Normal  range of motion.     Cervical back: Normal range of motion. No tenderness.     Right lower leg: 1+ Pitting Edema present.     Left lower leg: 1+ Pitting Edema present.  Skin:    General: Skin is warm and dry.  Neurological:     General: No focal deficit present.     Mental Status: She is alert and oriented to person, place, and time.     Cranial Nerves: No cranial nerve deficit.     Motor: No weakness.  Psychiatric:        Mood and Affect: Mood normal.        Behavior: Behavior normal.     No results found for any visits on 02/05/24.  No results found for this or any previous visit (from the past 2160 hours).    Assessment & Plan:  As per problem list  Problem List Items Addressed This Visit   None Visit Diagnoses       Acute bronchitis due to other specified organisms    -  Primary   Relevant Medications   predniSONE (DELTASONE) 20 MG tablet   benzonatate (TESSALON PERLES) 100 MG capsule   doxycycline (VIBRA-TABS) 100 MG tablet   Other Relevant Orders   DG Chest 2 View       Return if symptoms worsen or fail to improve.   Total time spent: 30 minutes  Luna Fuse, MD  02/05/2024   This document may have been prepared by [facility] Voice Recognition software and as such may include unintentional dictation errors.

## 2024-02-13 ENCOUNTER — Ambulatory Visit (INDEPENDENT_AMBULATORY_CARE_PROVIDER_SITE_OTHER): Payer: 59 | Admitting: Internal Medicine

## 2024-02-13 ENCOUNTER — Encounter: Payer: Self-pay | Admitting: Internal Medicine

## 2024-02-13 VITALS — BP 116/66 | HR 119 | Temp 98.5°F | Ht 63.0 in | Wt 177.2 lb

## 2024-02-13 DIAGNOSIS — J208 Acute bronchitis due to other specified organisms: Secondary | ICD-10-CM

## 2024-02-13 DIAGNOSIS — M5416 Radiculopathy, lumbar region: Secondary | ICD-10-CM

## 2024-02-13 DIAGNOSIS — B3731 Acute candidiasis of vulva and vagina: Secondary | ICD-10-CM

## 2024-02-13 DIAGNOSIS — E7800 Pure hypercholesterolemia, unspecified: Secondary | ICD-10-CM | POA: Diagnosis not present

## 2024-02-13 DIAGNOSIS — K219 Gastro-esophageal reflux disease without esophagitis: Secondary | ICD-10-CM | POA: Diagnosis not present

## 2024-02-13 DIAGNOSIS — Z0130 Encounter for examination of blood pressure without abnormal findings: Secondary | ICD-10-CM

## 2024-02-13 MED ORDER — FLUCONAZOLE 150 MG PO TABS: 150.0000 mg | ORAL_TABLET | Freq: Every day | ORAL | 0 refills | Status: AC

## 2024-02-13 NOTE — Progress Notes (Signed)
 Established Patient Office Visit  Subjective:  Patient ID: Jean Gomez, female    DOB: [birth year]  Age: 68 y.o. MRN: [id number]  Chief Complaint  Patient presents with   Follow-up    1 week follow up    No new complaints and states she feels better now. Cough has subsided and no longer productive of greenish sputum.    No other concerns at this time.   Past Medical History:  Diagnosis Date   Acute postoperative pain 01/16/2019   Allergy    Asthma    Cerebral aneurysm    Chest pain 11/01/2016   Chronic migraine    Migraine   Chronic pain syndrome    Chronic respiratory failure (HCC)    Constipation    COPD (chronic obstructive pulmonary disease) (HCC)    Depression    major depression in remission   Hypercholesterolemia    Hypertension    Osteoarthritis    Pneumonia    12/2018   Sarcoid    Seizures (HCC)    Sleep apnea     Past Surgical History:  Procedure Laterality Date   ABDOMINAL HYSTERECTOMY     BACK SURGERY     spine fusion anterior lumbar with open posterior   BIOPSY  07/02/2023   Procedure: BIOPSY;  Surgeon: Regis Bill, MD;  Location: [facility] ENDOSCOPY;  Service: Endoscopy;;   CHOLECYSTECTOMY     COLONOSCOPY WITH PROPOFOL N/A 08/19/2019   Procedure: COLONOSCOPY WITH PROPOFOL;  Surgeon: Christena Deem, MD;  Location: [facility] ENDOSCOPY;  Service: Endoscopy;  Laterality: N/A;   ESOPHAGOGASTRODUODENOSCOPY (EGD) WITH PROPOFOL N/A 08/19/2019   Procedure: ESOPHAGOGASTRODUODENOSCOPY (EGD) WITH PROPOFOL;  Surgeon: Christena Deem, MD;  Location: [facility] ENDOSCOPY;  Service: Endoscopy;  Laterality: N/A;   ESOPHAGOGASTRODUODENOSCOPY (EGD) WITH PROPOFOL N/A 08/04/2020   Procedure: ESOPHAGOGASTRODUODENOSCOPY (EGD) WITH PROPOFOL;  Surgeon: Earline Mayotte, MD;  Location: [facility] ENDOSCOPY;  Service: Endoscopy;  Laterality: N/A;   ESOPHAGOGASTRODUODENOSCOPY (EGD) WITH PROPOFOL N/A 11/05/2020   Procedure: ESOPHAGOGASTRODUODENOSCOPY (EGD) WITH PROPOFOL;  Surgeon:  Earline Mayotte, MD;  Location: [facility] ENDOSCOPY;  Service: Endoscopy;  Laterality: N/A;   ESOPHAGOGASTRODUODENOSCOPY (EGD) WITH PROPOFOL N/A 07/02/2023   Procedure: ESOPHAGOGASTRODUODENOSCOPY (EGD) WITH PROPOFOL;  Surgeon: Regis Bill, MD;  Location: [facility] ENDOSCOPY;  Service: Endoscopy;  Laterality: N/A;   ESOPHAGOGASTRODUODENOSCOPY (EGD) WITH PROPOFOL N/A 10/01/2023   Procedure: ESOPHAGOGASTRODUODENOSCOPY (EGD) WITH PROPOFOL;  Surgeon: Midge Minium, MD;  Location: [facility] ENDOSCOPY;  Service: Endoscopy;  Laterality: N/A;   SPINAL FUSION      Social History   Socioeconomic History   Marital status: Single    Spouse name: Not on file   Number of children: Not on file   Years of education: Not on file   Highest education level: Not on file  Occupational History   Not on file  Tobacco Use   Smoking status: Never   Smokeless tobacco: Never  Vaping Use   Vaping status: Never Used  Substance and Sexual Activity   Alcohol use: Yes    Comment: rarely   Drug use: No   Sexual activity: Not on file  Other Topics Concern   Not on file  Social History Narrative   Not on file   Social Drivers of Health   Financial Resource Strain: Low Risk  (03/05/2018)   Overall Financial Resource Strain (CARDIA)    Difficulty of Paying Living Expenses: Not very hard  Food Insecurity: No Food Insecurity (10/24/2023)   Hunger Vital Sign    Worried  About Running Out of Food in the Last Year: Never true    Ran Out of Food in the Last Year: Never true  Transportation Needs: No Transportation Needs (10/24/2023)   PRAPARE - Administrator, Civil Service (Medical): No    Lack of Transportation (Non-Medical): No  Physical Activity: Unknown (03/05/2018)   Exercise Vital Sign    Days of Exercise per Week: Patient declined    Minutes of Exercise per Session: Patient declined  Stress: No Stress Concern Present (03/05/2018)   [employer] of Occupational Health - Occupational Stress  Questionnaire    Feeling of Stress : Only a little  Social Connections: Unknown (03/05/2018)   Social Connection and Isolation Panel [NHANES]    Frequency of Communication with Friends and Family: Patient declined    Frequency of Social Gatherings with Friends and Family: Patient declined    Attends Religious Services: Patient declined    Database administrator or Organizations: Patient declined    Attends Banker Meetings: Patient declined    Marital Status: Patient declined  Intimate Partner Violence: Not At Risk (10/22/2023)   Humiliation, Afraid, Rape, and Kick questionnaire    Fear of Current or Ex-Partner: No    Emotionally Abused: No    Physically Abused: No    Sexually Abused: No    Family History  Problem Relation Age of Onset   Dementia Mother    Breast cancer Mother    Throat cancer Father     Allergies  Allergen Reactions   Penicillins Rash, Hives and Itching   Contrast Media [Iodinated Contrast Media] Swelling   Gabapentin    Labetalol Other (See Comments)    Made hair fall out   Lactose Intolerance (Gi)     Bloating and diarrhea    Sulfabenzamide Nausea Only   Pregabalin Other (See Comments)    The patient complained of hemifacial numbness with the use of the medication.    Outpatient Medications Prior to Visit  Medication Sig   albuterol (PROVENTIL) (2.5 MG/3ML) 0.083% nebulizer solution Take 2.5 mg by nebulization every 6 (six) hours as needed for wheezing or shortness of breath.   Albuterol Sulfate 108 (90 Base) MCG/ACT AEPB Inhale 2 puffs into the lungs every 6 (six) hours as needed.   ALPRAZolam (XANAX) 0.25 MG tablet Take 0.25 mg by mouth at bedtime as needed for sleep.   amLODipine-olmesartan (AZOR) 5-40 MG tablet TAKE 1 TABLET BY MOUTH EVERY MORNING   aspirin EC 81 MG tablet Take 1 tablet (81 mg total) by mouth daily.   atorvastatin (LIPITOR) 80 MG tablet TAKE 1 TABLET BY MOUTH NIGHTLY   benzonatate (TESSALON PERLES) 100 MG capsule Take  1 capsule (100 mg total) by mouth 3 (three) times daily as needed for up to 10 days for cough.   calcium carbonate (OS-CAL) 1250 (500 Ca) MG chewable tablet Chew 1 tablet by mouth daily.   citalopram (CELEXA) 40 MG tablet TAKE ONE TABLET BY MOUTH EVERY MORNING   diphenhydrAMINE (BENADRYL) 25 MG tablet Take 25 mg by mouth every 6 (six) hours as needed.   donepezil (ARICEPT) 10 MG tablet TAKE ONE TABLET BY MOUTH EVERY MORNING   Elastic Bandages & Supports (MEDICAL COMPRESSION STOCKINGS) MISC Please provide compression stockings   Fluticasone Furoate (ARNUITY ELLIPTA) 100 MCG/ACT AEPB TAKE 1 INHALATION BY MOUTH ONCE DAILY   hydrochlorothiazide (HYDRODIURIL) 25 MG tablet TAKE 1 TABLET BY MOUTH EVERY MORNING   indomethacin (INDOCIN) 50 MG capsule TAKE 1  CAPSULE BY MOUTH 3 TIMES DAILY ASNEEDED FOR HEADACHES   lactulose (CHRONULAC) 10 GM/15ML solution Take 10 g by mouth 2 (two) times daily as needed for mild constipation.   levETIRAcetam (KEPPRA) 750 MG tablet TAKE ONE TABLET BY MOUTH EVERY MORNING   LINZESS 290 MCG CAPS capsule TAKE 1 CAPSULE BY MOUTH EVERY MORNING   mirtazapine (REMERON) 15 MG tablet TAKE ONE TABLET BY MOUTH AT BEDTIME   nitroGLYCERIN (NITROSTAT) 0.4 MG SL tablet Place 0.4 mg under the tongue every 5 (five) minutes as needed for chest pain.   ondansetron (ZOFRAN-ODT) 4 MG disintegrating tablet Take 1 tablet (4 mg total) by mouth every 8 (eight) hours as needed for nausea or vomiting.   Oxycodone HCl 10 MG TABS Take 10 mg by mouth 3 (three) times daily as needed.   OXYGEN Inhale 2 L/min into the lungs continuous.   potassium chloride SA (KLOR-CON M) 20 MEQ tablet TAKE ONE TABLET BY MOUTH EVERY MORNING   sucralfate (CARAFATE) 1 g tablet TAKE ONE TABLET BY MOUTH FOUR TIMES DAILY BEFORE MEALS AND AT BEDTIME   Suvorexant (BELSOMRA) 20 MG TABS Take 1 tablet (20 mg total) by mouth at bedtime.   tiZANidine (ZANAFLEX) 4 MG tablet Take 1 tablet (4 mg total) by mouth 3 (three) times  daily. (Patient taking differently: Take 4 mg by mouth 3 (three) times daily as needed.)   dicyclomine (BENTYL) 20 MG tablet Take 1 tablet (20 mg total) by mouth every 6 (six) hours as needed for up to 7 days for spasms.   pantoprazole (PROTONIX) 40 MG tablet Take 1 tablet (40 mg total) by mouth 2 (two) times daily.   promethazine (PHENERGAN) 25 MG tablet Take 1 tablet (25 mg total) by mouth every 6 (six) hours as needed for nausea or vomiting. (Patient not taking: Reported on 02/13/2024)   No facility-administered medications prior to visit.    Review of Systems  Constitutional: Negative.   HENT: Negative.    Eyes: Negative.   Respiratory:  Positive for cough, sputum production, shortness of breath and wheezing (improved).   Cardiovascular:  Positive for chest pain (pleuritic). Negative for palpitations.  Genitourinary: Negative.   Skin: Negative.   Neurological: Negative.   Endo/Heme/Allergies: Negative.   Psychiatric/Behavioral:  The patient has insomnia.        Objective:   BP 116/66   Pulse (!) 119   Temp 98.5 F (36.9 C) (Tympanic)   Ht 5\' 3"  (1.6 m)   Wt 177 lb 3.2 oz (80.4 kg)   SpO2 97% Comment: 2L  BMI 31.39 kg/m   Vitals:   02/13/24 1040  BP: 116/66  Pulse: (!) 119  Temp: 98.5 F (36.9 C)  Height: 5\' 3"  (1.6 m)  Weight: 177 lb 3.2 oz (80.4 kg)  SpO2: 97% Comment: 2L  TempSrc: Tympanic  BMI (Calculated): 31.4    Physical Exam Vitals reviewed.  Constitutional:      General: She is not in acute distress. HENT:     Head: Normocephalic.     Nose: Nose normal.     Mouth/Throat:     Mouth: Mucous membranes are moist.  Eyes:     Extraocular Movements: Extraocular movements intact.     Pupils: Pupils are equal, round, and reactive to light.  Cardiovascular:     Rate and Rhythm: Normal rate and regular rhythm.     Heart sounds: No murmur heard. Pulmonary:     Effort: Pulmonary effort is normal.     Breath sounds:  No rales.  Abdominal:     General:  Abdomen is flat.     Palpations: There is no hepatomegaly, splenomegaly or mass.  Musculoskeletal:        General: Normal range of motion.     Cervical back: Normal range of motion. No tenderness.     Right lower leg: 1+ Pitting Edema present.     Left lower leg: 1+ Pitting Edema present.  Skin:    General: Skin is warm and dry.  Neurological:     General: No focal deficit present.     Mental Status: She is alert and oriented to person, place, and time.     Cranial Nerves: No cranial nerve deficit.     Motor: No weakness.  Psychiatric:        Mood and Affect: Mood normal.        Behavior: Behavior normal.      No results found for any visits on 02/13/24.  No results found for this or any previous visit (from the past 2160 hours).    Assessment & Plan:  As per problem list  Problem List Items Addressed This Visit       Digestive   GERD (gastroesophageal reflux disease)     Nervous and Auditory   Chronic lumbar radiculopathy (Right) (Chronic)   Relevant Orders   Ambulatory referral to Pain Clinic     Other   Hypercholesterolemia   Other Visit Diagnoses       Acute bronchitis due to other specified organisms    -  Primary   Relevant Medications   fluconazole (DIFLUCAN) 150 MG tablet     Vaginal candida       Relevant Medications   fluconazole (DIFLUCAN) 150 MG tablet       Return in about 2 months (around 04/12/2024) for awv with labs prior.   Total time spent: 20 minutes  Luna Fuse, MD  02/13/2024   This document may have been prepared by [facility] Voice Recognition software and as such may include unintentional dictation errors.

## 2024-02-15 DIAGNOSIS — M25562 Pain in left knee: Secondary | ICD-10-CM | POA: Diagnosis not present

## 2024-02-15 DIAGNOSIS — G8929 Other chronic pain: Secondary | ICD-10-CM | POA: Diagnosis not present

## 2024-02-15 DIAGNOSIS — J449 Chronic obstructive pulmonary disease, unspecified: Secondary | ICD-10-CM | POA: Diagnosis not present

## 2024-02-15 DIAGNOSIS — M25561 Pain in right knee: Secondary | ICD-10-CM | POA: Diagnosis not present

## 2024-02-15 DIAGNOSIS — J9611 Chronic respiratory failure with hypoxia: Secondary | ICD-10-CM | POA: Diagnosis not present

## 2024-02-15 DIAGNOSIS — M545 Low back pain, unspecified: Secondary | ICD-10-CM | POA: Diagnosis not present

## 2024-02-15 DIAGNOSIS — M7062 Trochanteric bursitis, left hip: Secondary | ICD-10-CM | POA: Diagnosis not present

## 2024-02-15 DIAGNOSIS — M25511 Pain in right shoulder: Secondary | ICD-10-CM | POA: Diagnosis not present

## 2024-02-15 DIAGNOSIS — G894 Chronic pain syndrome: Secondary | ICD-10-CM | POA: Diagnosis not present

## 2024-02-18 ENCOUNTER — Ambulatory Visit: Payer: 59 | Admitting: Internal Medicine

## 2024-02-18 NOTE — Progress Notes (Signed)
 Patient informed.

## 2024-02-20 ENCOUNTER — Other Ambulatory Visit: Payer: Self-pay | Admitting: Internal Medicine

## 2024-02-20 DIAGNOSIS — J208 Acute bronchitis due to other specified organisms: Secondary | ICD-10-CM

## 2024-02-25 ENCOUNTER — Other Ambulatory Visit: Payer: Self-pay | Admitting: Internal Medicine

## 2024-02-25 DIAGNOSIS — J449 Chronic obstructive pulmonary disease, unspecified: Secondary | ICD-10-CM | POA: Diagnosis not present

## 2024-03-07 ENCOUNTER — Other Ambulatory Visit: Payer: Self-pay | Admitting: Internal Medicine

## 2024-03-14 DIAGNOSIS — J9611 Chronic respiratory failure with hypoxia: Secondary | ICD-10-CM | POA: Diagnosis not present

## 2024-03-14 DIAGNOSIS — G8929 Other chronic pain: Secondary | ICD-10-CM | POA: Diagnosis not present

## 2024-03-14 DIAGNOSIS — G894 Chronic pain syndrome: Secondary | ICD-10-CM | POA: Diagnosis not present

## 2024-03-14 DIAGNOSIS — M545 Low back pain, unspecified: Secondary | ICD-10-CM | POA: Diagnosis not present

## 2024-03-14 DIAGNOSIS — M7062 Trochanteric bursitis, left hip: Secondary | ICD-10-CM | POA: Diagnosis not present

## 2024-03-14 DIAGNOSIS — J449 Chronic obstructive pulmonary disease, unspecified: Secondary | ICD-10-CM | POA: Diagnosis not present

## 2024-03-14 DIAGNOSIS — M25562 Pain in left knee: Secondary | ICD-10-CM | POA: Diagnosis not present

## 2024-03-14 DIAGNOSIS — M25511 Pain in right shoulder: Secondary | ICD-10-CM | POA: Diagnosis not present

## 2024-03-14 DIAGNOSIS — M25561 Pain in right knee: Secondary | ICD-10-CM | POA: Diagnosis not present

## 2024-03-17 ENCOUNTER — Other Ambulatory Visit: Payer: Self-pay

## 2024-03-17 DIAGNOSIS — K219 Gastro-esophageal reflux disease without esophagitis: Secondary | ICD-10-CM

## 2024-03-17 MED ORDER — PROMETHAZINE HCL 25 MG PO TABS: 25.0000 mg | ORAL_TABLET | Freq: Four times a day (QID) | ORAL | 0 refills | Status: DC | PRN

## 2024-03-24 ENCOUNTER — Other Ambulatory Visit: Payer: Self-pay | Admitting: Internal Medicine

## 2024-03-25 ENCOUNTER — Other Ambulatory Visit: Payer: Self-pay | Admitting: Internal Medicine

## 2024-03-26 NOTE — Congregational Nurse Program (Unsigned)
  Dept: [phone]   Congregational Nurse Program Note  Date of Encounter: 03/26/2024  Clinic visit to request assistance with obtaining her medications for the month of May including any prescriptions required after May 1st eye surgery.  Arrangements made for her to obtain all medications during the month of May at [facility] through the CCNP account with the pharmacy.   Past Medical History: Past Medical History:  Diagnosis Date   Acute postoperative pain 01/16/2019   Allergy    Asthma    Cerebral aneurysm    Chest pain 11/01/2016   Chronic migraine    Migraine   Chronic pain syndrome    Chronic respiratory failure (HCC)    Constipation    COPD (chronic obstructive pulmonary disease) (HCC)    Depression    major depression in remission   Hypercholesterolemia    Hypertension    Osteoarthritis    Pneumonia    12/2018   Sarcoid    Seizures (HCC)    Sleep apnea     Encounter Details:  Community Questionnaire - 03/26/24 1038       Questionnaire   Ask client: Do you give verbal consent for me to treat you today? Yes    Student Assistance N/A    Location Patient Served  [facility]    Encounter Setting CN site    Population Status Unknown   Has own apartment at [employer];Medicare    Insurance/Financial Assistance Referral N/A    Medication Have Medication Insecurities;Patient Medications Reviewed;Provided Medication Assistance    Medical Provider Yes    Medical Referrals Made N/A    Medical Appointment Completed N/A    CNP Interventions Advocate/Support;Navigate Healthcare System;Case Management;Counsel;Educate;Spiritual Care    Screenings CN Performed Blood Pressure    ED Visit Averted N/A    Life-Saving Intervention Made N/A

## 2024-03-27 ENCOUNTER — Other Ambulatory Visit: Payer: Self-pay | Admitting: Internal Medicine

## 2024-03-27 DIAGNOSIS — J449 Chronic obstructive pulmonary disease, unspecified: Secondary | ICD-10-CM | POA: Diagnosis not present

## 2024-04-07 ENCOUNTER — Other Ambulatory Visit: Payer: Self-pay | Admitting: Internal Medicine

## 2024-04-07 DIAGNOSIS — M545 Low back pain, unspecified: Secondary | ICD-10-CM | POA: Diagnosis not present

## 2024-04-07 DIAGNOSIS — M533 Sacrococcygeal disorders, not elsewhere classified: Secondary | ICD-10-CM | POA: Diagnosis not present

## 2024-04-07 DIAGNOSIS — M7061 Trochanteric bursitis, right hip: Secondary | ICD-10-CM | POA: Diagnosis not present

## 2024-04-07 DIAGNOSIS — F5101 Primary insomnia: Secondary | ICD-10-CM

## 2024-04-07 DIAGNOSIS — M7062 Trochanteric bursitis, left hip: Secondary | ICD-10-CM | POA: Diagnosis not present

## 2024-04-08 ENCOUNTER — Other Ambulatory Visit: Payer: Self-pay | Admitting: Internal Medicine

## 2024-04-10 DIAGNOSIS — R000 Tachycardia, unspecified: Secondary | ICD-10-CM | POA: Diagnosis not present

## 2024-04-10 DIAGNOSIS — D869 Sarcoidosis, unspecified: Secondary | ICD-10-CM | POA: Diagnosis not present

## 2024-04-10 DIAGNOSIS — J439 Emphysema, unspecified: Secondary | ICD-10-CM | POA: Diagnosis not present

## 2024-04-11 ENCOUNTER — Other Ambulatory Visit: Payer: Self-pay | Admitting: Internal Medicine

## 2024-04-11 DIAGNOSIS — G4733 Obstructive sleep apnea (adult) (pediatric): Secondary | ICD-10-CM | POA: Diagnosis not present

## 2024-04-15 DIAGNOSIS — J9611 Chronic respiratory failure with hypoxia: Secondary | ICD-10-CM | POA: Diagnosis not present

## 2024-04-15 DIAGNOSIS — M25562 Pain in left knee: Secondary | ICD-10-CM | POA: Diagnosis not present

## 2024-04-15 DIAGNOSIS — M25512 Pain in left shoulder: Secondary | ICD-10-CM | POA: Diagnosis not present

## 2024-04-15 DIAGNOSIS — M7062 Trochanteric bursitis, left hip: Secondary | ICD-10-CM | POA: Diagnosis not present

## 2024-04-15 DIAGNOSIS — M25511 Pain in right shoulder: Secondary | ICD-10-CM | POA: Diagnosis not present

## 2024-04-15 DIAGNOSIS — J449 Chronic obstructive pulmonary disease, unspecified: Secondary | ICD-10-CM | POA: Diagnosis not present

## 2024-04-15 DIAGNOSIS — G894 Chronic pain syndrome: Secondary | ICD-10-CM | POA: Diagnosis not present

## 2024-04-15 DIAGNOSIS — M25561 Pain in right knee: Secondary | ICD-10-CM | POA: Diagnosis not present

## 2024-04-17 DIAGNOSIS — H02401 Unspecified ptosis of right eyelid: Secondary | ICD-10-CM | POA: Diagnosis not present

## 2024-04-18 ENCOUNTER — Ambulatory Visit: Payer: 59 | Admitting: Internal Medicine

## 2024-04-26 DIAGNOSIS — J449 Chronic obstructive pulmonary disease, unspecified: Secondary | ICD-10-CM | POA: Diagnosis not present

## 2024-04-28 ENCOUNTER — Other Ambulatory Visit: Payer: Self-pay | Admitting: Internal Medicine

## 2024-05-01 ENCOUNTER — Other Ambulatory Visit

## 2024-05-01 ENCOUNTER — Other Ambulatory Visit: Payer: Self-pay | Admitting: Internal Medicine

## 2024-05-01 DIAGNOSIS — E7800 Pure hypercholesterolemia, unspecified: Secondary | ICD-10-CM | POA: Diagnosis not present

## 2024-05-01 DIAGNOSIS — K219 Gastro-esophageal reflux disease without esophagitis: Secondary | ICD-10-CM | POA: Diagnosis not present

## 2024-05-02 LAB — CBC WITH DIFF/PLATELET
Basophils Absolute: 0 10*3/uL (ref 0.0–0.2)
Basos: 0 %
EOS (ABSOLUTE): 0 10*3/uL (ref 0.0–0.4)
Eos: 0 %
Hematocrit: 32.7 % — ABNORMAL LOW (ref 34.0–46.6)
Hemoglobin: 9.4 g/dL — ABNORMAL LOW (ref 11.1–15.9)
Immature Grans (Abs): 0 10*3/uL (ref 0.0–0.1)
Immature Granulocytes: 0 %
Lymphocytes Absolute: 2.3 10*3/uL (ref 0.7–3.1)
Lymphs: 29 %
MCH: 23.4 pg — ABNORMAL LOW (ref 26.6–33.0)
MCHC: 28.7 g/dL — ABNORMAL LOW (ref 31.5–35.7)
MCV: 82 fL (ref 79–97)
Monocytes Absolute: 0.7 10*3/uL (ref 0.1–0.9)
Monocytes: 9 %
Neutrophils Absolute: 4.9 10*3/uL (ref 1.4–7.0)
Neutrophils: 62 %
Platelets: 582 10*3/uL — ABNORMAL HIGH (ref 150–450)
RBC: 4.01 x10E6/uL (ref 3.77–5.28)
RDW: 15.4 % (ref 11.7–15.4)
WBC: 7.9 10*3/uL (ref 3.4–10.8)

## 2024-05-02 LAB — COMPREHENSIVE METABOLIC PANEL WITH GFR
ALT: 12 IU/L (ref 0–32)
AST: 17 IU/L (ref 0–40)
Albumin: 4.1 g/dL (ref 3.9–4.9)
Alkaline Phosphatase: 101 IU/L (ref 44–121)
BUN/Creatinine Ratio: 20 (ref 12–28)
BUN: 14 mg/dL (ref 8–27)
Bilirubin Total: <0.2 mg/dL (ref 0.0–1.2)
CO2: 20 mmol/L (ref 20–29)
Calcium: 9.7 mg/dL (ref 8.7–10.3)
Chloride: 100 mmol/L (ref 96–106)
Creatinine, Ser: 0.69 mg/dL (ref 0.57–1.00)
Globulin, Total: 3.1 g/dL (ref 1.5–4.5)
Glucose: 113 mg/dL — ABNORMAL HIGH (ref 70–99)
Potassium: 3.8 mmol/L (ref 3.5–5.2)
Sodium: 140 mmol/L (ref 134–144)
Total Protein: 7.2 g/dL (ref 6.0–8.5)
eGFR: 94 mL/min/{1.73_m2} (ref >59)

## 2024-05-02 LAB — LIPID PANEL
Chol/HDL Ratio: 2.5 ratio (ref 0.0–4.4)
Cholesterol, Total: 227 mg/dL — ABNORMAL HIGH (ref 100–199)
HDL: 92 mg/dL (ref >39)
LDL Chol Calc (NIH): 120 mg/dL — ABNORMAL HIGH (ref 0–99)
Triglycerides: 85 mg/dL (ref 0–149)
VLDL Cholesterol Cal: 15 mg/dL (ref 5–40)

## 2024-05-05 ENCOUNTER — Other Ambulatory Visit: Payer: Self-pay | Admitting: Internal Medicine

## 2024-05-05 DIAGNOSIS — M533 Sacrococcygeal disorders, not elsewhere classified: Secondary | ICD-10-CM | POA: Diagnosis not present

## 2024-05-12 ENCOUNTER — Other Ambulatory Visit: Payer: Self-pay | Admitting: Internal Medicine

## 2024-05-15 DIAGNOSIS — M25512 Pain in left shoulder: Secondary | ICD-10-CM | POA: Diagnosis not present

## 2024-05-15 DIAGNOSIS — Z79899 Other long term (current) drug therapy: Secondary | ICD-10-CM | POA: Diagnosis not present

## 2024-05-15 DIAGNOSIS — M25561 Pain in right knee: Secondary | ICD-10-CM | POA: Diagnosis not present

## 2024-05-15 DIAGNOSIS — J9611 Chronic respiratory failure with hypoxia: Secondary | ICD-10-CM | POA: Diagnosis not present

## 2024-05-15 DIAGNOSIS — M7062 Trochanteric bursitis, left hip: Secondary | ICD-10-CM | POA: Diagnosis not present

## 2024-05-15 DIAGNOSIS — G8929 Other chronic pain: Secondary | ICD-10-CM | POA: Diagnosis not present

## 2024-05-15 DIAGNOSIS — G894 Chronic pain syndrome: Secondary | ICD-10-CM | POA: Diagnosis not present

## 2024-05-15 DIAGNOSIS — M545 Low back pain, unspecified: Secondary | ICD-10-CM | POA: Diagnosis not present

## 2024-05-15 DIAGNOSIS — M25511 Pain in right shoulder: Secondary | ICD-10-CM | POA: Diagnosis not present

## 2024-05-15 DIAGNOSIS — J449 Chronic obstructive pulmonary disease, unspecified: Secondary | ICD-10-CM | POA: Diagnosis not present

## 2024-05-15 DIAGNOSIS — M25562 Pain in left knee: Secondary | ICD-10-CM | POA: Diagnosis not present

## 2024-05-16 ENCOUNTER — Ambulatory Visit: Admitting: Internal Medicine

## 2024-05-16 DIAGNOSIS — Z79899 Other long term (current) drug therapy: Secondary | ICD-10-CM | POA: Diagnosis not present

## 2024-05-27 DIAGNOSIS — R7989 Other specified abnormal findings of blood chemistry: Secondary | ICD-10-CM | POA: Diagnosis not present

## 2024-05-27 DIAGNOSIS — J449 Chronic obstructive pulmonary disease, unspecified: Secondary | ICD-10-CM | POA: Diagnosis not present

## 2024-05-27 DIAGNOSIS — R000 Tachycardia, unspecified: Secondary | ICD-10-CM | POA: Diagnosis not present

## 2024-05-27 DIAGNOSIS — G4733 Obstructive sleep apnea (adult) (pediatric): Secondary | ICD-10-CM | POA: Diagnosis not present

## 2024-05-27 DIAGNOSIS — J439 Emphysema, unspecified: Secondary | ICD-10-CM | POA: Diagnosis not present

## 2024-05-27 DIAGNOSIS — D869 Sarcoidosis, unspecified: Secondary | ICD-10-CM | POA: Diagnosis not present

## 2024-05-30 ENCOUNTER — Other Ambulatory Visit: Payer: Self-pay | Admitting: Internal Medicine

## 2024-06-02 ENCOUNTER — Other Ambulatory Visit: Payer: Self-pay | Admitting: Internal Medicine

## 2024-06-04 ENCOUNTER — Other Ambulatory Visit: Payer: Self-pay | Admitting: Internal Medicine

## 2024-06-07 ENCOUNTER — Other Ambulatory Visit: Payer: Self-pay | Admitting: Internal Medicine

## 2024-06-09 ENCOUNTER — Ambulatory Visit: Admitting: Internal Medicine

## 2024-06-11 DIAGNOSIS — J449 Chronic obstructive pulmonary disease, unspecified: Secondary | ICD-10-CM | POA: Diagnosis not present

## 2024-06-11 DIAGNOSIS — M25512 Pain in left shoulder: Secondary | ICD-10-CM | POA: Diagnosis not present

## 2024-06-11 DIAGNOSIS — J9611 Chronic respiratory failure with hypoxia: Secondary | ICD-10-CM | POA: Diagnosis not present

## 2024-06-11 DIAGNOSIS — M25561 Pain in right knee: Secondary | ICD-10-CM | POA: Diagnosis not present

## 2024-06-11 DIAGNOSIS — M7062 Trochanteric bursitis, left hip: Secondary | ICD-10-CM | POA: Diagnosis not present

## 2024-06-11 DIAGNOSIS — Z79899 Other long term (current) drug therapy: Secondary | ICD-10-CM | POA: Diagnosis not present

## 2024-06-11 DIAGNOSIS — M545 Low back pain, unspecified: Secondary | ICD-10-CM | POA: Diagnosis not present

## 2024-06-11 DIAGNOSIS — M25511 Pain in right shoulder: Secondary | ICD-10-CM | POA: Diagnosis not present

## 2024-06-11 DIAGNOSIS — G894 Chronic pain syndrome: Secondary | ICD-10-CM | POA: Diagnosis not present

## 2024-06-11 DIAGNOSIS — M25562 Pain in left knee: Secondary | ICD-10-CM | POA: Diagnosis not present

## 2024-06-23 ENCOUNTER — Other Ambulatory Visit: Payer: Self-pay | Admitting: Internal Medicine

## 2024-06-24 ENCOUNTER — Other Ambulatory Visit: Payer: Self-pay | Admitting: Internal Medicine

## 2024-06-26 DIAGNOSIS — J449 Chronic obstructive pulmonary disease, unspecified: Secondary | ICD-10-CM | POA: Diagnosis not present

## 2024-06-30 ENCOUNTER — Other Ambulatory Visit: Payer: Self-pay | Admitting: Internal Medicine

## 2024-07-07 NOTE — Progress Notes (Signed)
   07/07/2024  Patient ID: Tery JULIANNA Daring, female   DOB: 1956/10/18, 68 y.o.   MRN: [id number]  Pharmacy Quality Measure Review  This patient is appearing on a report for being at risk of failing the adherence measure for cholesterol (statin) medications this calendar year.   Medication: Atorvastatin  Last fill date: 06/09/24 for 30 day supply  Insurance report was not up to date. No action needed at this time.   Jon VEAR Lindau, PharmD Clinical Pharmacist [phone]

## 2024-07-14 ENCOUNTER — Other Ambulatory Visit: Payer: Self-pay | Admitting: Internal Medicine

## 2024-07-14 DIAGNOSIS — J449 Chronic obstructive pulmonary disease, unspecified: Secondary | ICD-10-CM | POA: Diagnosis not present

## 2024-07-14 DIAGNOSIS — G894 Chronic pain syndrome: Secondary | ICD-10-CM | POA: Diagnosis not present

## 2024-07-14 DIAGNOSIS — Z79899 Other long term (current) drug therapy: Secondary | ICD-10-CM | POA: Diagnosis not present

## 2024-07-14 DIAGNOSIS — M25511 Pain in right shoulder: Secondary | ICD-10-CM | POA: Diagnosis not present

## 2024-07-14 DIAGNOSIS — M25512 Pain in left shoulder: Secondary | ICD-10-CM | POA: Diagnosis not present

## 2024-07-14 DIAGNOSIS — M25561 Pain in right knee: Secondary | ICD-10-CM | POA: Diagnosis not present

## 2024-07-14 DIAGNOSIS — J9611 Chronic respiratory failure with hypoxia: Secondary | ICD-10-CM | POA: Diagnosis not present

## 2024-07-14 DIAGNOSIS — M7062 Trochanteric bursitis, left hip: Secondary | ICD-10-CM | POA: Diagnosis not present

## 2024-07-14 DIAGNOSIS — M25562 Pain in left knee: Secondary | ICD-10-CM | POA: Diagnosis not present

## 2024-07-14 DIAGNOSIS — M545 Low back pain, unspecified: Secondary | ICD-10-CM | POA: Diagnosis not present

## 2024-07-16 ENCOUNTER — Other Ambulatory Visit: Payer: Self-pay | Admitting: Internal Medicine

## 2024-07-16 DIAGNOSIS — F5101 Primary insomnia: Secondary | ICD-10-CM

## 2024-07-17 DIAGNOSIS — Z79899 Other long term (current) drug therapy: Secondary | ICD-10-CM | POA: Diagnosis not present

## 2024-07-24 ENCOUNTER — Other Ambulatory Visit

## 2024-07-24 DIAGNOSIS — G4733 Obstructive sleep apnea (adult) (pediatric): Secondary | ICD-10-CM | POA: Diagnosis not present

## 2024-07-24 DIAGNOSIS — E782 Mixed hyperlipidemia: Secondary | ICD-10-CM

## 2024-07-24 DIAGNOSIS — I10 Essential (primary) hypertension: Secondary | ICD-10-CM | POA: Diagnosis not present

## 2024-07-24 DIAGNOSIS — D869 Sarcoidosis, unspecified: Secondary | ICD-10-CM | POA: Diagnosis not present

## 2024-07-24 DIAGNOSIS — J439 Emphysema, unspecified: Secondary | ICD-10-CM | POA: Diagnosis not present

## 2024-07-24 DIAGNOSIS — Z9981 Dependence on supplemental oxygen: Secondary | ICD-10-CM | POA: Diagnosis not present

## 2024-07-24 DIAGNOSIS — R000 Tachycardia, unspecified: Secondary | ICD-10-CM | POA: Diagnosis not present

## 2024-07-25 LAB — CBC WITH DIFF/PLATELET
Basophils Absolute: 0 x10E3/uL (ref 0.0–0.2)
Basos: 0 %
EOS (ABSOLUTE): 0 x10E3/uL (ref 0.0–0.4)
Eos: 0 %
Hematocrit: 30.7 % — ABNORMAL LOW (ref 34.0–46.6)
Hemoglobin: 8.9 g/dL — ABNORMAL LOW (ref 11.1–15.9)
Immature Grans (Abs): 0 x10E3/uL (ref 0.0–0.1)
Immature Granulocytes: 0 %
Lymphocytes Absolute: 2 x10E3/uL (ref 0.7–3.1)
Lymphs: 22 %
MCH: 21.7 pg — ABNORMAL LOW (ref 26.6–33.0)
MCHC: 29 g/dL — ABNORMAL LOW (ref 31.5–35.7)
MCV: 75 fL — ABNORMAL LOW (ref 79–97)
Monocytes Absolute: 0.4 x10E3/uL (ref 0.1–0.9)
Monocytes: 5 %
Neutrophils Absolute: 6.6 x10E3/uL (ref 1.4–7.0)
Neutrophils: 73 %
Platelets: 454 x10E3/uL — ABNORMAL HIGH (ref 150–450)
RBC: 4.11 x10E6/uL (ref 3.77–5.28)
RDW: 15.6 % — ABNORMAL HIGH (ref 11.7–15.4)
WBC: 9.1 x10E3/uL (ref 3.4–10.8)

## 2024-07-25 LAB — COMPREHENSIVE METABOLIC PANEL WITH GFR
ALT: 13 IU/L (ref 0–32)
AST: 20 IU/L (ref 0–40)
Albumin: 3.6 g/dL — ABNORMAL LOW (ref 3.9–4.9)
Alkaline Phosphatase: 97 IU/L (ref 44–121)
BUN/Creatinine Ratio: 17 (ref 12–28)
BUN: 14 mg/dL (ref 8–27)
Bilirubin Total: <0.2 mg/dL (ref 0.0–1.2)
CO2: 18 mmol/L — ABNORMAL LOW (ref 20–29)
Calcium: 9.1 mg/dL (ref 8.7–10.3)
Chloride: 103 mmol/L (ref 96–106)
Creatinine, Ser: 0.84 mg/dL (ref 0.57–1.00)
Globulin, Total: 2.4 g/dL (ref 1.5–4.5)
Glucose: 97 mg/dL (ref 70–99)
Potassium: 3.8 mmol/L (ref 3.5–5.2)
Sodium: 140 mmol/L (ref 134–144)
Total Protein: 6 g/dL (ref 6.0–8.5)
eGFR: 76 mL/min/1.73 (ref >59)

## 2024-07-25 LAB — LIPID PANEL
Chol/HDL Ratio: 2.6 ratio (ref 0.0–4.4)
Cholesterol, Total: 185 mg/dL (ref 100–199)
HDL: 71 mg/dL (ref >39)
LDL Chol Calc (NIH): 99 mg/dL (ref 0–99)
Triglycerides: 84 mg/dL (ref 0–149)
VLDL Cholesterol Cal: 15 mg/dL (ref 5–40)

## 2024-07-27 DIAGNOSIS — J449 Chronic obstructive pulmonary disease, unspecified: Secondary | ICD-10-CM | POA: Diagnosis not present

## 2024-07-28 ENCOUNTER — Encounter: Payer: Self-pay | Admitting: Internal Medicine

## 2024-07-28 ENCOUNTER — Ambulatory Visit: Payer: Self-pay | Admitting: Internal Medicine

## 2024-07-28 ENCOUNTER — Ambulatory Visit (INDEPENDENT_AMBULATORY_CARE_PROVIDER_SITE_OTHER): Admitting: Internal Medicine

## 2024-07-28 VITALS — BP 130/100 | HR 122 | Ht 63.0 in | Wt 184.8 lb

## 2024-07-28 DIAGNOSIS — M5441 Lumbago with sciatica, right side: Secondary | ICD-10-CM | POA: Diagnosis not present

## 2024-07-28 DIAGNOSIS — F5101 Primary insomnia: Secondary | ICD-10-CM | POA: Diagnosis not present

## 2024-07-28 DIAGNOSIS — G8929 Other chronic pain: Secondary | ICD-10-CM

## 2024-07-28 DIAGNOSIS — I10 Essential (primary) hypertension: Secondary | ICD-10-CM | POA: Diagnosis not present

## 2024-07-28 DIAGNOSIS — I2089 Other forms of angina pectoris: Secondary | ICD-10-CM

## 2024-07-28 DIAGNOSIS — R300 Dysuria: Secondary | ICD-10-CM

## 2024-07-28 DIAGNOSIS — D638 Anemia in other chronic diseases classified elsewhere: Secondary | ICD-10-CM

## 2024-07-28 DIAGNOSIS — K219 Gastro-esophageal reflux disease without esophagitis: Secondary | ICD-10-CM | POA: Diagnosis not present

## 2024-07-28 DIAGNOSIS — F419 Anxiety disorder, unspecified: Secondary | ICD-10-CM

## 2024-07-28 DIAGNOSIS — K5901 Slow transit constipation: Secondary | ICD-10-CM

## 2024-07-28 DIAGNOSIS — E7800 Pure hypercholesterolemia, unspecified: Secondary | ICD-10-CM

## 2024-07-28 DIAGNOSIS — Z1331 Encounter for screening for depression: Secondary | ICD-10-CM | POA: Diagnosis not present

## 2024-07-28 DIAGNOSIS — Z0001 Encounter for general adult medical examination with abnormal findings: Secondary | ICD-10-CM | POA: Diagnosis not present

## 2024-07-28 LAB — POCT URINALYSIS DIPSTICK
Bilirubin, UA: NEGATIVE
Blood, UA: NEGATIVE
Glucose, UA: NEGATIVE
Ketones, UA: POSITIVE
Nitrite, UA: NEGATIVE
Protein, UA: NEGATIVE
Spec Grav, UA: 1.01 (ref 1.010–1.025)
Urobilinogen, UA: 0.2 U/dL
pH, UA: 7 (ref 5.0–8.0)

## 2024-07-28 MED ORDER — SUCRALFATE 1 G PO TABS
1.0000 g | ORAL_TABLET | Freq: Four times a day (QID) | ORAL | 0 refills | Status: DC
Start: 2024-07-28 — End: 2024-10-21

## 2024-07-28 MED ORDER — BELSOMRA 20 MG PO TABS: 1.0000 | ORAL_TABLET | Freq: Every day | ORAL | 2 refills | Status: DC

## 2024-07-28 MED ORDER — AMLODIPINE-OLMESARTAN 5-40 MG PO TABS: 1.0000 | ORAL_TABLET | Freq: Every morning | ORAL | 1 refills | Status: DC

## 2024-07-28 MED ORDER — LINACLOTIDE 290 MCG PO CAPS: 290.0000 ug | ORAL_CAPSULE | Freq: Every day | ORAL | 2 refills | Status: DC

## 2024-07-28 MED ORDER — PANTOPRAZOLE SODIUM 40 MG PO TBEC: 40.0000 mg | DELAYED_RELEASE_TABLET | Freq: Two times a day (BID) | ORAL | 0 refills | Status: DC

## 2024-07-28 MED ORDER — INDOMETHACIN 50 MG PO CAPS: 50.0000 mg | ORAL_CAPSULE | Freq: Three times a day (TID) | ORAL | 2 refills | Status: DC | PRN

## 2024-07-28 MED ORDER — ATORVASTATIN CALCIUM 80 MG PO TABS
80.0000 mg | ORAL_TABLET | Freq: Every day | ORAL | 0 refills | Status: DC
Start: 2024-07-28 — End: 2024-10-21

## 2024-07-28 MED ORDER — CITALOPRAM HYDROBROMIDE 40 MG PO TABS: 40.0000 mg | ORAL_TABLET | Freq: Every morning | ORAL | 0 refills | Status: DC

## 2024-07-28 MED ORDER — BUSPIRONE HCL 15 MG PO TABS: 15.0000 mg | ORAL_TABLET | Freq: Two times a day (BID) | ORAL | 0 refills | Status: DC

## 2024-07-28 MED ORDER — DONEPEZIL HCL 10 MG PO TABS: 10.0000 mg | ORAL_TABLET | Freq: Every morning | ORAL | 0 refills | Status: DC

## 2024-07-28 NOTE — Progress Notes (Signed)
 Established Patient Office Visit  Subjective:  Patient ID: Jean Gomez, female    DOB: 05-Jan-1956  Age: 68 y.o. MRN: [id number]  Chief Complaint  Patient presents with   Annual Exam    AWV    C/o urinary frequency and dysuria x several weeks. Also here for AWV refer to quality metrics and scanned documents.       No other concerns at this time.   Past Medical History:  Diagnosis Date   Acute postoperative pain 01/16/2019   Allergy    Asthma    Cerebral aneurysm    Chest pain 11/01/2016   Chronic migraine    Migraine   Chronic pain syndrome    Chronic respiratory failure (HCC)    Constipation    COPD (chronic obstructive pulmonary disease) (HCC)    Depression    major depression in remission   Hypercholesterolemia    Hypertension    Osteoarthritis    Pneumonia    12/2018   Sarcoid    Seizures (HCC)    Sleep apnea     Past Surgical History:  Procedure Laterality Date   ABDOMINAL HYSTERECTOMY     BACK SURGERY     spine fusion anterior lumbar with open posterior   BIOPSY  07/02/2023   Procedure: BIOPSY;  Surgeon: Maryruth Ole DASEN, MD;  Location: [facility] ENDOSCOPY;  Service: Endoscopy;;   CHOLECYSTECTOMY     COLONOSCOPY WITH PROPOFOL  N/A 08/19/2019   Procedure: COLONOSCOPY WITH PROPOFOL ;  Surgeon: Gaylyn Gladis PENNER, MD;  Location: [facility] ENDOSCOPY;  Service: Endoscopy;  Laterality: N/A;   ESOPHAGOGASTRODUODENOSCOPY (EGD) WITH PROPOFOL  N/A 08/19/2019   Procedure: ESOPHAGOGASTRODUODENOSCOPY (EGD) WITH PROPOFOL ;  Surgeon: Gaylyn Gladis PENNER, MD;  Location: [facility] ENDOSCOPY;  Service: Endoscopy;  Laterality: N/A;   ESOPHAGOGASTRODUODENOSCOPY (EGD) WITH PROPOFOL  N/A 08/04/2020   Procedure: ESOPHAGOGASTRODUODENOSCOPY (EGD) WITH PROPOFOL ;  Surgeon: Dessa Reyes ORN, MD;  Location: [facility] ENDOSCOPY;  Service: Endoscopy;  Laterality: N/A;   ESOPHAGOGASTRODUODENOSCOPY (EGD) WITH PROPOFOL  N/A 11/05/2020   Procedure: ESOPHAGOGASTRODUODENOSCOPY (EGD) WITH PROPOFOL ;  Surgeon:  Dessa Reyes ORN, MD;  Location: [facility] ENDOSCOPY;  Service: Endoscopy;  Laterality: N/A;   ESOPHAGOGASTRODUODENOSCOPY (EGD) WITH PROPOFOL  N/A 07/02/2023   Procedure: ESOPHAGOGASTRODUODENOSCOPY (EGD) WITH PROPOFOL ;  Surgeon: Maryruth Ole DASEN, MD;  Location: [facility] ENDOSCOPY;  Service: Endoscopy;  Laterality: N/A;   ESOPHAGOGASTRODUODENOSCOPY (EGD) WITH PROPOFOL  N/A 10/01/2023   Procedure: ESOPHAGOGASTRODUODENOSCOPY (EGD) WITH PROPOFOL ;  Surgeon: Jinny Carmine, MD;  Location: [facility] ENDOSCOPY;  Service: Endoscopy;  Laterality: N/A;   SPINAL FUSION      Social History   Socioeconomic History   Marital status: Single    Spouse name: Not on file   Number of children: Not on file   Years of education: Not on file   Highest education level: Not on file  Occupational History   Not on file  Tobacco Use   Smoking status: Never   Smokeless tobacco: Never  Vaping Use   Vaping status: Never Used  Substance and Sexual Activity   Alcohol use: Yes    Comment: rarely   Drug use: No   Sexual activity: Not on file  Other Topics Concern   Not on file  Social History Narrative   Not on file   Social Drivers of Health   Financial Resource Strain: Low Risk  (04/10/2024)   Received from [facility]   Overall Financial Resource Strain (CARDIA)    Difficulty of Paying Living Expenses: Not very hard  Food Insecurity: Food Insecurity Present (04/10/2024)  Received from [facility]   Hunger Vital Sign    Within the past 12 months, you worried that your food would run out before you got the money to buy more.: Never true    Within the past 12 months, the food you bought just didn't last and you didn't have money to get more.: Sometimes true  Transportation Needs: No Transportation Needs (04/10/2024)   Received from [facility] - Transportation    In the past 12 months, has lack of transportation kept you from medical appointments or from  getting medications?: No    Lack of Transportation (Non-Medical): No  Physical Activity: Unknown (03/05/2018)   Exercise Vital Sign    Days of Exercise per Week: Patient declined    Minutes of Exercise per Session: Patient declined  Stress: No Stress Concern Present (03/05/2018)   [employer] Occupational [employer] - Occupational Stress Questionnaire    Feeling of Stress : Only a little  Social Connections: Unknown (03/05/2018)   Social Connection and Isolation Panel    Frequency of Communication with Friends and Family: Patient declined    Frequency of Social Gatherings with Friends and Family: Patient declined    Attends Religious Services: Patient declined    Database administrator or Organizations: Patient declined    Attends Banker Meetings: Patient declined    Marital Status: Patient declined  Intimate Partner Violence: Not At Risk (10/22/2023)   Humiliation, Afraid, Rape, and Kick questionnaire    Fear of Current or Ex-Partner: No    Emotionally Abused: No    Physically Abused: No    Sexually Abused: No    Family History  Problem Relation Age of Onset   Dementia Mother    Breast cancer Mother    Throat cancer Father     Allergies  Allergen Reactions   Penicillins Rash, Hives and Itching   Contrast Media [Iodinated Contrast Media] Swelling   Gabapentin    Labetalol Other (See Comments)    Made hair fall out   Lactose Intolerance (Gi)     Bloating and diarrhea    Sulfabenzamide Nausea Only   Pregabalin  Other (See Comments)    The patient complained of hemifacial numbness with the use of the medication.    Outpatient Medications Prior to Visit  Medication Sig   albuterol  (PROVENTIL ) (2.5 MG/3ML) 0.083% nebulizer solution Take 2.5 mg by nebulization every 6 (six) hours as needed for wheezing or shortness of breath.   Albuterol  Sulfate 108 (90 Base) MCG/ACT AEPB Inhale 2 puffs into the lungs every 6 (six) hours as needed.   ALPRAZolam  (XANAX ) 0.25  MG tablet Take 0.25 mg by mouth at bedtime as needed for sleep.   aspirin  EC 81 MG tablet Take 1 tablet (81 mg total) by mouth daily.   benzonatate  (TESSALON ) 100 MG capsule TAKE 1 CAPSULE BY MOUTH 3 TIMES DAILY ASNEEDED FOR UP TO 10 DAYS FOR COUGH   calcium  carbonate (OS-CAL) 1250 (500 Ca) MG chewable tablet Chew 1 tablet by mouth daily.   dicyclomine  (BENTYL ) 20 MG tablet Take 1 tablet (20 mg total) by mouth every 6 (six) hours as needed for up to 7 days for spasms.   diphenhydrAMINE  (BENADRYL ) 25 MG tablet Take 25 mg by mouth every 6 (six) hours as needed.   Elastic Bandages & Supports (MEDICAL COMPRESSION STOCKINGS) MISC Please provide compression stockings   Fluticasone  Furoate (ARNUITY ELLIPTA ) 100 MCG/ACT AEPB TAKE 1 INHALATION BY  MOUTH ONCE DAILY   hydrochlorothiazide  (HYDRODIURIL ) 25 MG tablet TAKE 1 TABLET BY MOUTH EVERY MORNING   lactulose (CHRONULAC) 10 GM/15ML solution TAKE (3 TEASPOONFULS) BY MOUTH TWICE DAILY AS NEEDED   levETIRAcetam  (KEPPRA ) 750 MG tablet TAKE 1 TABLET BY MOUTH EVERY MORNING   mirtazapine  (REMERON ) 15 MG tablet TAKE ONE TABLET BY MOUTH AT BEDTIME   nitroGLYCERIN  (NITROSTAT ) 0.4 MG SL tablet Place 0.4 mg under the tongue every 5 (five) minutes as needed for chest pain.   ondansetron  (ZOFRAN -ODT) 4 MG disintegrating tablet Take 1 tablet (4 mg total) by mouth every 8 (eight) hours as needed for nausea or vomiting.   Oxycodone  HCl 10 MG TABS Take 10 mg by mouth 3 (three) times daily as needed.   OXYGEN  Inhale 2 L/min into the lungs continuous.   potassium chloride  SA (KLOR-CON  M) 20 MEQ tablet TAKE ONE TABLET BY MOUTH EVERY MORNING   promethazine  (PHENERGAN ) 25 MG tablet Take 1 tablet (25 mg total) by mouth every 6 (six) hours as needed for nausea or vomiting.   tiZANidine  (ZANAFLEX ) 4 MG tablet Take 1 tablet (4 mg total) by mouth 3 (three) times daily. (Patient taking differently: Take 4 mg by mouth 3 (three) times daily as needed.)   [DISCONTINUED]  amLODipine -olmesartan  (AZOR ) 5-40 MG tablet TAKE 1 TABLET BY MOUTH EVERY MORNING   [DISCONTINUED] atorvastatin  (LIPITOR) 80 MG tablet TAKE 1 TABLET BY MOUTH NIGHTLY   [DISCONTINUED] busPIRone  (BUSPAR ) 15 MG tablet Take 1 tablet (15 mg total) by mouth 2 (two) times daily.   [DISCONTINUED] citalopram  (CELEXA ) 40 MG tablet TAKE ONE TABLET BY MOUTH EVERY MORNING   [DISCONTINUED] donepezil  (ARICEPT ) 10 MG tablet TAKE 1 TABLET BY MOUTH EVERY MORNING   [DISCONTINUED] indomethacin  (INDOCIN ) 50 MG capsule TAKE 1 CAPSULE BY MOUTH 3 TIMES DAILY ASNEEDED FOR HEADACHES   [DISCONTINUED] linaclotide  (LINZESS ) 290 MCG CAPS capsule TAKE 1 CAPSULE BY MOUTH EACH MORNING   [DISCONTINUED] pantoprazole  (PROTONIX ) 40 MG tablet Take 1 tablet (40 mg total) by mouth 2 (two) times daily.   [DISCONTINUED] sucralfate  (CARAFATE ) 1 g tablet TAKE ONE TABLET BY MOUTH FOUR TIMES DAILY BEFORE MEALS AND AT BEDTIME   [DISCONTINUED] Suvorexant  (BELSOMRA ) 20 MG TABS TAKE ONE TABLET BY MOUTH AT BEDTIME   No facility-administered medications prior to visit.    Review of Systems  Constitutional: Negative.   HENT: Negative.    Eyes: Negative.   Respiratory:  Positive for shortness of breath and wheezing (improved).   Cardiovascular:  Positive for chest pain (pleuritic). Negative for palpitations.  Genitourinary: Negative.   Skin: Negative.   Neurological:  Positive for headaches.  Endo/Heme/Allergies: Negative.   Psychiatric/Behavioral:  The patient has insomnia.        Objective:   BP (!) 130/100   Pulse (!) 122   Ht 5' 3 (1.6 m)   Wt 184 lb 12.8 oz (83.8 kg)   SpO2 92%   BMI 32.74 kg/m   Vitals:   07/28/24 1328  BP: (!) 130/100  Pulse: (!) 122  Height: 5' 3 (1.6 m)  Weight: 184 lb 12.8 oz (83.8 kg)  SpO2: 92%  BMI (Calculated): 32.74    Physical Exam   Results for orders placed or performed in visit on 07/28/24  POCT Urinalysis Dipstick (81002)  Result Value Ref Range   Color, UA     Clarity, UA      Glucose, UA Negative Negative   Bilirubin, UA negative    Ketones, UA positive    Spec  Grav, UA 1.010 1.010 - 1.025   Blood, UA negative    pH, UA 7.0 5.0 - 8.0   Protein, UA Negative Negative   Urobilinogen, UA 0.2 0.2 or 1.0 E.U./dL   Nitrite, UA negative    Leukocytes, UA Trace (A) Negative   Appearance     Odor      Recent Results (from the past 2160 hours)  CBC With Diff/Platelet     Status: Abnormal   Collection Time: 05/01/24 10:34 AM  Result Value Ref Range   WBC 7.9 3.4 - 10.8 x10E3/uL   RBC 4.01 3.77 - 5.28 x10E6/uL   Hemoglobin 9.4 (L) 11.1 - 15.9 g/dL   Hematocrit 67.2 (L) 65.9 - 46.6 %   MCV 82 79 - 97 fL   MCH 23.4 (L) 26.6 - 33.0 pg   MCHC 28.7 (L) 31.5 - 35.7 g/dL   RDW 84.5 88.2 - 84.5 %   Platelets 582 (H) 150 - 450 x10E3/uL   Neutrophils 62 Not Estab. %   Lymphs 29 Not Estab. %   Monocytes 9 Not Estab. %   Eos 0 Not Estab. %   Basos 0 Not Estab. %   Neutrophils Absolute 4.9 1.4 - 7.0 x10E3/uL   Lymphocytes Absolute 2.3 0.7 - 3.1 x10E3/uL   Monocytes Absolute 0.7 0.1 - 0.9 x10E3/uL   EOS (ABSOLUTE) 0.0 0.0 - 0.4 x10E3/uL   Basophils Absolute 0.0 0.0 - 0.2 x10E3/uL   Immature Granulocytes 0 Not Estab. %   Immature Grans (Abs) 0.0 0.0 - 0.1 x10E3/uL  Comprehensive metabolic panel with GFR     Status: Abnormal   Collection Time: 05/01/24 10:34 AM  Result Value Ref Range   Glucose 113 (H) 70 - 99 mg/dL   BUN 14 8 - 27 mg/dL   Creatinine, Ser 9.30 0.57 - 1.00 mg/dL   eGFR 94 >40 fO/fpw/8.26   BUN/Creatinine Ratio 20 12 - 28   Sodium 140 134 - 144 mmol/L   Potassium 3.8 3.5 - 5.2 mmol/L   Chloride 100 96 - 106 mmol/L   CO2 20 20 - 29 mmol/L   Calcium  9.7 8.7 - 10.3 mg/dL   Total Protein 7.2 6.0 - 8.5 g/dL   Albumin  4.1 3.9 - 4.9 g/dL   Globulin, Total 3.1 1.5 - 4.5 g/dL   Bilirubin Total <9.7 0.0 - 1.2 mg/dL   Alkaline Phosphatase 101 44 - 121 IU/L   AST 17 0 - 40 IU/L   ALT 12 0 - 32 IU/L  Lipid panel     Status: Abnormal   Collection Time:  05/01/24 10:34 AM  Result Value Ref Range   Cholesterol, Total 227 (H) 100 - 199 mg/dL   Triglycerides 85 0 - 149 mg/dL   HDL 92 >60 mg/dL   VLDL Cholesterol Cal 15 5 - 40 mg/dL   LDL Chol Calc (NIH) 879 (H) 0 - 99 mg/dL   Chol/HDL Ratio 2.5 0.0 - 4.4 ratio    Comment:                                   T. Chol/HDL Ratio                                             Men  Women  1/2 Avg.Risk  3.4    3.3                                   Avg.Risk  5.0    4.4                                2X Avg.Risk  9.6    7.1                                3X Avg.Risk 23.4   11.0   Lipid panel     Status: None   Collection Time: 07/24/24 11:53 AM  Result Value Ref Range   Cholesterol, Total 185 100 - 199 mg/dL   Triglycerides 84 0 - 149 mg/dL   HDL 71 >60 mg/dL   VLDL Cholesterol Cal 15 5 - 40 mg/dL   LDL Chol Calc (NIH) 99 0 - 99 mg/dL   Chol/HDL Ratio 2.6 0.0 - 4.4 ratio    Comment:                                   T. Chol/HDL Ratio                                             Men  Women                               1/2 Avg.Risk  3.4    3.3                                   Avg.Risk  5.0    4.4                                2X Avg.Risk  9.6    7.1                                3X Avg.Risk 23.4   11.0   Comprehensive metabolic panel with GFR     Status: Abnormal   Collection Time: 07/24/24 11:53 AM  Result Value Ref Range   Glucose 97 70 - 99 mg/dL   BUN 14 8 - 27 mg/dL   Creatinine, Ser 9.15 0.57 - 1.00 mg/dL   eGFR 76 >40 fO/fpw/8.26   BUN/Creatinine Ratio 17 12 - 28   Sodium 140 134 - 144 mmol/L   Potassium 3.8 3.5 - 5.2 mmol/L   Chloride 103 96 - 106 mmol/L   CO2 18 (L) 20 - 29 mmol/L   Calcium  9.1 8.7 - 10.3 mg/dL   Total Protein 6.0 6.0 - 8.5 g/dL   Albumin  3.6 (L) 3.9 - 4.9 g/dL   Globulin, Total 2.4 1.5 - 4.5 g/dL   Bilirubin Total <9.7 0.0 - 1.2 mg/dL   Alkaline Phosphatase 97 44 - 121 IU/L   AST 20 0 - 40  IU/L   ALT 13 0 - 32 IU/L  CBC With  Diff/Platelet     Status: Abnormal   Collection Time: 07/24/24 11:53 AM  Result Value Ref Range   WBC 9.1 3.4 - 10.8 x10E3/uL   RBC 4.11 3.77 - 5.28 x10E6/uL   Hemoglobin 8.9 (L) 11.1 - 15.9 g/dL   Hematocrit 69.2 (L) 65.9 - 46.6 %   MCV 75 (L) 79 - 97 fL   MCH 21.7 (L) 26.6 - 33.0 pg   MCHC 29.0 (L) 31.5 - 35.7 g/dL   RDW 84.3 (H) 88.2 - 84.5 %   Platelets 454 (H) 150 - 450 x10E3/uL   Neutrophils 73 Not Estab. %   Lymphs 22 Not Estab. %   Monocytes 5 Not Estab. %   Eos 0 Not Estab. %   Basos 0 Not Estab. %   Neutrophils Absolute 6.6 1.4 - 7.0 x10E3/uL   Lymphocytes Absolute 2.0 0.7 - 3.1 x10E3/uL   Monocytes Absolute 0.4 0.1 - 0.9 x10E3/uL   EOS (ABSOLUTE) 0.0 0.0 - 0.4 x10E3/uL   Basophils Absolute 0.0 0.0 - 0.2 x10E3/uL   Immature Granulocytes 0 Not Estab. %   Immature Grans (Abs) 0.0 0.0 - 0.1 x10E3/uL  POCT Urinalysis Dipstick (18997)     Status: Abnormal   Collection Time: 07/28/24  2:14 PM  Result Value Ref Range   Color, UA     Clarity, UA     Glucose, UA Negative Negative   Bilirubin, UA negative    Ketones, UA positive    Spec Grav, UA 1.010 1.010 - 1.025   Blood, UA negative    pH, UA 7.0 5.0 - 8.0   Protein, UA Negative Negative   Urobilinogen, UA 0.2 0.2 or 1.0 E.U./dL   Nitrite, UA negative    Leukocytes, UA Trace (A) Negative   Appearance     Odor        Assessment & Plan:  Ciel was seen today for annual exam.  Anemia of chronic disease -     Indomethacin ; Take 1 capsule (50 mg total) by mouth 3 (three) times daily as needed.  Dispense: 90 capsule; Refill: 2  Primary insomnia -     Belsomra ; Take 1 tablet (20 mg total) by mouth at bedtime.  Dispense: 30 tablet; Refill: 2  Hypercholesterolemia -     Atorvastatin  Calcium ; Take 1 tablet (80 mg total) by mouth at bedtime.  Dispense: 90 tablet; Refill: 0 -     Lipid panel -     Comprehensive metabolic panel with GFR  Slow transit constipation Overview: Overview:  Theatre stage manager of this  note might be different from the original. amatiza  Orders: -     linaCLOtide ; Take 1 capsule (290 mcg total) by mouth daily before breakfast.  Dispense: 30 capsule; Refill: 2  Gastroesophageal reflux disease without esophagitis -     Pantoprazole  Sodium; Take 1 tablet (40 mg total) by mouth 2 (two) times daily.  Dispense: 180 tablet; Refill: 0 -     Sucralfate ; Take 1 tablet (1 g total) by mouth 4 (four) times daily.  Dispense: 360 tablet; Refill: 0  Essential hypertension -     Indomethacin ; Take 1 capsule (50 mg total) by mouth 3 (three) times daily as needed.  Dispense: 90 capsule; Refill: 2 -     POCT urinalysis dipstick  Stable angina pectoris (HCC) -     Ambulatory referral to Cardiology  Anxiety -     busPIRone  HCl; Take  1 tablet (15 mg total) by mouth 2 (two) times daily.  Dispense: 180 tablet; Refill: 0 -     Citalopram  Hydrobromide; Take 1 tablet (40 mg total) by mouth every morning.  Dispense: 90 tablet; Refill: 0  Chronic low back pain (Midline) (Secondary Area of Pain)  Other orders -     amLODIPine -Olmesartan ; Take 1 tablet by mouth every morning.  Dispense: 90 tablet; Refill: 1 -     Donepezil  HCl; Take 1 tablet (10 mg total) by mouth every morning.  Dispense: 90 tablet; Refill: 0    Problem List Items Addressed This Visit       Cardiovascular and Mediastinum   Essential hypertension   Relevant Medications   amLODipine -olmesartan  (AZOR ) 5-40 MG tablet   atorvastatin  (LIPITOR) 80 MG tablet   indomethacin  (INDOCIN ) 50 MG capsule   Other Relevant Orders   POCT Urinalysis Dipstick (81002) (Completed)     Digestive   GERD (gastroesophageal reflux disease)   Relevant Medications   pantoprazole  (PROTONIX ) 40 MG tablet   linaclotide  (LINZESS ) 290 MCG CAPS capsule   sucralfate  (CARAFATE ) 1 g tablet     Nervous and Auditory   Chronic low back pain (Midline) (Secondary Area of Pain) (Chronic)   Relevant Medications   busPIRone  (BUSPAR ) 15 MG tablet    citalopram  (CELEXA ) 40 MG tablet   donepezil  (ARICEPT ) 10 MG tablet   indomethacin  (INDOCIN ) 50 MG capsule   Suvorexant  (BELSOMRA ) 20 MG TABS     Other   Constipation   Relevant Medications   linaclotide  (LINZESS ) 290 MCG CAPS capsule   Hypercholesterolemia   Relevant Medications   amLODipine -olmesartan  (AZOR ) 5-40 MG tablet   atorvastatin  (LIPITOR) 80 MG tablet   Other Relevant Orders   Lipid panel   Comprehensive metabolic panel with GFR   Insomnia   Relevant Medications   Suvorexant  (BELSOMRA ) 20 MG TABS   Anemia of chronic disease - Primary   Relevant Medications   indomethacin  (INDOCIN ) 50 MG capsule   Other Visit Diagnoses       Stable angina pectoris (HCC)       Relevant Medications   amLODipine -olmesartan  (AZOR ) 5-40 MG tablet   atorvastatin  (LIPITOR) 80 MG tablet   citalopram  (CELEXA ) 40 MG tablet   indomethacin  (INDOCIN ) 50 MG capsule   Other Relevant Orders   Ambulatory referral to Cardiology     Anxiety       Relevant Medications   busPIRone  (BUSPAR ) 15 MG tablet   citalopram  (CELEXA ) 40 MG tablet       Return in 3 months (on 10/28/2024) for fu with labs prior.   Total time spent: 45 minutes  Sherrill Cinderella Perry, MD  07/28/2024   This document may have been prepared by [facility] Voice Recognition software and as such may include unintentional dictation errors.

## 2024-07-29 DIAGNOSIS — R000 Tachycardia, unspecified: Secondary | ICD-10-CM | POA: Diagnosis not present

## 2024-07-29 DIAGNOSIS — R7989 Other specified abnormal findings of blood chemistry: Secondary | ICD-10-CM | POA: Diagnosis not present

## 2024-07-29 DIAGNOSIS — R946 Abnormal results of thyroid function studies: Secondary | ICD-10-CM | POA: Diagnosis not present

## 2024-08-04 ENCOUNTER — Institutional Professional Consult (permissible substitution): Admitting: Cardiovascular Disease

## 2024-08-04 ENCOUNTER — Other Ambulatory Visit: Payer: Self-pay | Admitting: Internal Medicine

## 2024-08-04 ENCOUNTER — Telehealth: Payer: Self-pay

## 2024-08-04 MED ORDER — FLUTICASONE PROPIONATE 50 MCG/ACT NA SUSP
1.0000 | Freq: Every day | NASAL | 2 refills | Status: DC
Start: 2024-08-04 — End: 2024-10-21

## 2024-08-04 NOTE — Telephone Encounter (Signed)
 Pharmacy is requesting rx for a nasal spray  to [facility]

## 2024-08-11 ENCOUNTER — Institutional Professional Consult (permissible substitution): Admitting: Cardiovascular Disease

## 2024-08-12 ENCOUNTER — Institutional Professional Consult (permissible substitution): Admitting: Cardiovascular Disease

## 2024-08-14 ENCOUNTER — Institutional Professional Consult (permissible substitution): Admitting: Cardiovascular Disease

## 2024-08-14 DIAGNOSIS — Z79899 Other long term (current) drug therapy: Secondary | ICD-10-CM | POA: Diagnosis not present

## 2024-08-14 DIAGNOSIS — M25562 Pain in left knee: Secondary | ICD-10-CM | POA: Diagnosis not present

## 2024-08-14 DIAGNOSIS — M25511 Pain in right shoulder: Secondary | ICD-10-CM | POA: Diagnosis not present

## 2024-08-14 DIAGNOSIS — M7062 Trochanteric bursitis, left hip: Secondary | ICD-10-CM | POA: Diagnosis not present

## 2024-08-14 DIAGNOSIS — J449 Chronic obstructive pulmonary disease, unspecified: Secondary | ICD-10-CM | POA: Diagnosis not present

## 2024-08-14 DIAGNOSIS — J9611 Chronic respiratory failure with hypoxia: Secondary | ICD-10-CM | POA: Diagnosis not present

## 2024-08-14 DIAGNOSIS — G894 Chronic pain syndrome: Secondary | ICD-10-CM | POA: Diagnosis not present

## 2024-08-14 DIAGNOSIS — M25561 Pain in right knee: Secondary | ICD-10-CM | POA: Diagnosis not present

## 2024-08-14 DIAGNOSIS — M25512 Pain in left shoulder: Secondary | ICD-10-CM | POA: Diagnosis not present

## 2024-08-14 DIAGNOSIS — M545 Low back pain, unspecified: Secondary | ICD-10-CM | POA: Diagnosis not present

## 2024-08-19 ENCOUNTER — Other Ambulatory Visit: Payer: Self-pay | Admitting: Internal Medicine

## 2024-09-10 DIAGNOSIS — M25562 Pain in left knee: Secondary | ICD-10-CM | POA: Diagnosis not present

## 2024-09-10 DIAGNOSIS — M25511 Pain in right shoulder: Secondary | ICD-10-CM | POA: Diagnosis not present

## 2024-09-10 DIAGNOSIS — M545 Low back pain, unspecified: Secondary | ICD-10-CM | POA: Diagnosis not present

## 2024-09-10 DIAGNOSIS — M25561 Pain in right knee: Secondary | ICD-10-CM | POA: Diagnosis not present

## 2024-09-10 DIAGNOSIS — G8929 Other chronic pain: Secondary | ICD-10-CM | POA: Diagnosis not present

## 2024-09-10 DIAGNOSIS — M25512 Pain in left shoulder: Secondary | ICD-10-CM | POA: Diagnosis not present

## 2024-09-10 DIAGNOSIS — G894 Chronic pain syndrome: Secondary | ICD-10-CM | POA: Diagnosis not present

## 2024-09-10 DIAGNOSIS — J9611 Chronic respiratory failure with hypoxia: Secondary | ICD-10-CM | POA: Diagnosis not present

## 2024-09-10 DIAGNOSIS — M7062 Trochanteric bursitis, left hip: Secondary | ICD-10-CM | POA: Diagnosis not present

## 2024-09-10 DIAGNOSIS — Z79899 Other long term (current) drug therapy: Secondary | ICD-10-CM | POA: Diagnosis not present

## 2024-09-10 DIAGNOSIS — J449 Chronic obstructive pulmonary disease, unspecified: Secondary | ICD-10-CM | POA: Diagnosis not present

## 2024-09-13 ENCOUNTER — Inpatient Hospital Stay
Admission: EM | Admit: 2024-09-13 | Discharge: 2024-09-23 | DRG: 184 | Disposition: A | Discharge status: Skilled Nursing Facility | Attending: Internal Medicine | Admitting: Internal Medicine

## 2024-09-13 ENCOUNTER — Emergency Department

## 2024-09-13 ENCOUNTER — Other Ambulatory Visit: Payer: Self-pay

## 2024-09-13 DIAGNOSIS — J449 Chronic obstructive pulmonary disease, unspecified: Secondary | ICD-10-CM | POA: Diagnosis present

## 2024-09-13 DIAGNOSIS — R109 Unspecified abdominal pain: Secondary | ICD-10-CM

## 2024-09-13 DIAGNOSIS — D649 Anemia, unspecified: Secondary | ICD-10-CM | POA: Insufficient documentation

## 2024-09-13 DIAGNOSIS — S2241XA Multiple fractures of ribs, right side, initial encounter for closed fracture: Secondary | ICD-10-CM | POA: Diagnosis not present

## 2024-09-13 DIAGNOSIS — D509 Iron deficiency anemia, unspecified: Secondary | ICD-10-CM | POA: Diagnosis present

## 2024-09-13 DIAGNOSIS — S0990XA Unspecified injury of head, initial encounter: Secondary | ICD-10-CM | POA: Diagnosis not present

## 2024-09-13 DIAGNOSIS — G8929 Other chronic pain: Secondary | ICD-10-CM | POA: Diagnosis present

## 2024-09-13 DIAGNOSIS — R9431 Abnormal electrocardiogram [ECG] [EKG]: Principal | ICD-10-CM | POA: Diagnosis present

## 2024-09-13 DIAGNOSIS — Y9301 Activity, walking, marching and hiking: Secondary | ICD-10-CM | POA: Diagnosis present

## 2024-09-13 DIAGNOSIS — N179 Acute kidney failure, unspecified: Secondary | ICD-10-CM | POA: Diagnosis present

## 2024-09-13 DIAGNOSIS — E876 Hypokalemia: Secondary | ICD-10-CM | POA: Diagnosis present

## 2024-09-13 DIAGNOSIS — F3110 Bipolar disorder, current episode manic without psychotic features, unspecified: Secondary | ICD-10-CM | POA: Diagnosis present

## 2024-09-13 DIAGNOSIS — S2243XA Multiple fractures of ribs, bilateral, initial encounter for closed fracture: Secondary | ICD-10-CM | POA: Diagnosis present

## 2024-09-13 DIAGNOSIS — Z043 Encounter for examination and observation following other accident: Secondary | ICD-10-CM | POA: Diagnosis not present

## 2024-09-13 DIAGNOSIS — M47812 Spondylosis without myelopathy or radiculopathy, cervical region: Secondary | ICD-10-CM | POA: Diagnosis not present

## 2024-09-13 DIAGNOSIS — Z880 Allergy status to penicillin: Secondary | ICD-10-CM

## 2024-09-13 DIAGNOSIS — G4700 Insomnia, unspecified: Secondary | ICD-10-CM | POA: Diagnosis present

## 2024-09-13 DIAGNOSIS — J9611 Chronic respiratory failure with hypoxia: Secondary | ICD-10-CM | POA: Diagnosis present

## 2024-09-13 DIAGNOSIS — F325 Major depressive disorder, single episode, in full remission: Secondary | ICD-10-CM | POA: Diagnosis present

## 2024-09-13 DIAGNOSIS — F0393 Unspecified dementia, unspecified severity, with mood disturbance: Secondary | ICD-10-CM | POA: Diagnosis present

## 2024-09-13 DIAGNOSIS — D72829 Elevated white blood cell count, unspecified: Secondary | ICD-10-CM | POA: Diagnosis present

## 2024-09-13 DIAGNOSIS — M4312 Spondylolisthesis, cervical region: Secondary | ICD-10-CM | POA: Diagnosis not present

## 2024-09-13 DIAGNOSIS — I10 Essential (primary) hypertension: Secondary | ICD-10-CM | POA: Diagnosis present

## 2024-09-13 DIAGNOSIS — Z818 Family history of other mental and behavioral disorders: Secondary | ICD-10-CM

## 2024-09-13 DIAGNOSIS — F03918 Unspecified dementia, unspecified severity, with other behavioral disturbance: Secondary | ICD-10-CM | POA: Diagnosis present

## 2024-09-13 DIAGNOSIS — Z743 Need for continuous supervision: Secondary | ICD-10-CM | POA: Diagnosis not present

## 2024-09-13 DIAGNOSIS — W19XXXA Unspecified fall, initial encounter: Secondary | ICD-10-CM

## 2024-09-13 DIAGNOSIS — E7800 Pure hypercholesterolemia, unspecified: Secondary | ICD-10-CM | POA: Diagnosis present

## 2024-09-13 DIAGNOSIS — Z9049 Acquired absence of other specified parts of digestive tract: Secondary | ICD-10-CM

## 2024-09-13 DIAGNOSIS — F411 Generalized anxiety disorder: Secondary | ICD-10-CM | POA: Diagnosis present

## 2024-09-13 DIAGNOSIS — M25561 Pain in right knee: Secondary | ICD-10-CM | POA: Diagnosis present

## 2024-09-13 DIAGNOSIS — Z66 Do not resuscitate: Secondary | ICD-10-CM | POA: Diagnosis present

## 2024-09-13 DIAGNOSIS — Z79899 Other long term (current) drug therapy: Secondary | ICD-10-CM

## 2024-09-13 DIAGNOSIS — G40909 Epilepsy, unspecified, not intractable, without status epilepticus: Secondary | ICD-10-CM | POA: Diagnosis present

## 2024-09-13 DIAGNOSIS — Z7982 Long term (current) use of aspirin: Secondary | ICD-10-CM | POA: Diagnosis not present

## 2024-09-13 DIAGNOSIS — W109XXA Fall (on) (from) unspecified stairs and steps, initial encounter: Secondary | ICD-10-CM | POA: Diagnosis present

## 2024-09-13 DIAGNOSIS — E739 Lactose intolerance, unspecified: Secondary | ICD-10-CM | POA: Diagnosis present

## 2024-09-13 DIAGNOSIS — M79601 Pain in right arm: Secondary | ICD-10-CM | POA: Diagnosis not present

## 2024-09-13 DIAGNOSIS — R509 Fever, unspecified: Secondary | ICD-10-CM | POA: Diagnosis not present

## 2024-09-13 DIAGNOSIS — K5909 Other constipation: Secondary | ICD-10-CM | POA: Diagnosis present

## 2024-09-13 DIAGNOSIS — G894 Chronic pain syndrome: Secondary | ICD-10-CM | POA: Diagnosis present

## 2024-09-13 DIAGNOSIS — Z803 Family history of malignant neoplasm of breast: Secondary | ICD-10-CM

## 2024-09-13 DIAGNOSIS — R0781 Pleurodynia: Secondary | ICD-10-CM | POA: Diagnosis not present

## 2024-09-13 DIAGNOSIS — R1031 Right lower quadrant pain: Secondary | ICD-10-CM

## 2024-09-13 DIAGNOSIS — R10A0 Flank pain, unspecified side: Secondary | ICD-10-CM

## 2024-09-13 DIAGNOSIS — Z91041 Radiographic dye allergy status: Secondary | ICD-10-CM

## 2024-09-13 DIAGNOSIS — S3991XA Unspecified injury of abdomen, initial encounter: Secondary | ICD-10-CM | POA: Diagnosis not present

## 2024-09-13 DIAGNOSIS — I700 Atherosclerosis of aorta: Secondary | ICD-10-CM | POA: Diagnosis not present

## 2024-09-13 DIAGNOSIS — Z981 Arthrodesis status: Secondary | ICD-10-CM

## 2024-09-13 DIAGNOSIS — F0394 Unspecified dementia, unspecified severity, with anxiety: Secondary | ICD-10-CM | POA: Diagnosis present

## 2024-09-13 DIAGNOSIS — Z7401 Bed confinement status: Secondary | ICD-10-CM | POA: Diagnosis not present

## 2024-09-13 DIAGNOSIS — Z808 Family history of malignant neoplasm of other organs or systems: Secondary | ICD-10-CM

## 2024-09-13 DIAGNOSIS — D75838 Other thrombocytosis: Secondary | ICD-10-CM | POA: Diagnosis not present

## 2024-09-13 DIAGNOSIS — Z888 Allergy status to other drugs, medicaments and biological substances status: Secondary | ICD-10-CM

## 2024-09-13 DIAGNOSIS — J4489 Other specified chronic obstructive pulmonary disease: Secondary | ICD-10-CM | POA: Diagnosis present

## 2024-09-13 DIAGNOSIS — Z9981 Dependence on supplemental oxygen: Secondary | ICD-10-CM | POA: Diagnosis not present

## 2024-09-13 DIAGNOSIS — M7918 Myalgia, other site: Secondary | ICD-10-CM | POA: Diagnosis present

## 2024-09-13 DIAGNOSIS — G4733 Obstructive sleep apnea (adult) (pediatric): Secondary | ICD-10-CM | POA: Diagnosis present

## 2024-09-13 DIAGNOSIS — M50322 Other cervical disc degeneration at C5-C6 level: Secondary | ICD-10-CM | POA: Diagnosis not present

## 2024-09-13 DIAGNOSIS — Z90710 Acquired absence of both cervix and uterus: Secondary | ICD-10-CM

## 2024-09-13 DIAGNOSIS — D869 Sarcoidosis, unspecified: Secondary | ICD-10-CM | POA: Diagnosis present

## 2024-09-13 DIAGNOSIS — S2249XA Multiple fractures of ribs, unspecified side, initial encounter for closed fracture: Secondary | ICD-10-CM | POA: Diagnosis present

## 2024-09-13 DIAGNOSIS — Z8711 Personal history of peptic ulcer disease: Secondary | ICD-10-CM

## 2024-09-13 DIAGNOSIS — R220 Localized swelling, mass and lump, head: Secondary | ICD-10-CM | POA: Diagnosis not present

## 2024-09-13 LAB — COMPREHENSIVE METABOLIC PANEL WITH GFR
ALT: 14 U/L (ref 0–44)
AST: 31 U/L (ref 15–41)
Albumin: 2.6 g/dL — ABNORMAL LOW (ref 3.5–5.0)
Alkaline Phosphatase: 66 U/L (ref 38–126)
Anion gap: 16 — ABNORMAL HIGH (ref 5–15)
BUN: 21 mg/dL (ref 8–23)
CO2: 23 mmol/L (ref 22–32)
Calcium: 8.6 mg/dL — ABNORMAL LOW (ref 8.9–10.3)
Chloride: 100 mmol/L (ref 98–111)
Creatinine, Ser: 1.31 mg/dL — ABNORMAL HIGH (ref 0.44–1.00)
GFR, Estimated: 44 mL/min — ABNORMAL LOW (ref >60)
Glucose, Bld: 181 mg/dL — ABNORMAL HIGH (ref 70–99)
Potassium: 3 mmol/L — ABNORMAL LOW (ref 3.5–5.1)
Sodium: 139 mmol/L (ref 135–145)
Total Bilirubin: 0.4 mg/dL (ref 0.0–1.2)
Total Protein: 6.9 g/dL (ref 6.5–8.1)

## 2024-09-13 LAB — CBC WITH DIFFERENTIAL/PLATELET
Abs Immature Granulocytes: 0.23 K/uL — ABNORMAL HIGH (ref 0.00–0.07)
Basophils Absolute: 0 K/uL (ref 0.0–0.1)
Basophils Relative: 0 %
Eosinophils Absolute: 0 K/uL (ref 0.0–0.5)
Eosinophils Relative: 0 %
HCT: 25.4 % — ABNORMAL LOW (ref 36.0–46.0)
Hemoglobin: 7.6 g/dL — ABNORMAL LOW (ref 12.0–15.0)
Immature Granulocytes: 1 %
Lymphocytes Relative: 12 %
Lymphs Abs: 2.1 K/uL (ref 0.7–4.0)
MCH: 22.5 pg — ABNORMAL LOW (ref 26.0–34.0)
MCHC: 29.9 g/dL — ABNORMAL LOW (ref 30.0–36.0)
MCV: 75.1 fL — ABNORMAL LOW (ref 80.0–100.0)
Monocytes Absolute: 1.3 K/uL — ABNORMAL HIGH (ref 0.1–1.0)
Monocytes Relative: 7 %
Neutro Abs: 13.9 K/uL — ABNORMAL HIGH (ref 1.7–7.7)
Neutrophils Relative %: 80 %
Platelets: 608 K/uL — ABNORMAL HIGH (ref 150–400)
RBC: 3.38 MIL/uL — ABNORMAL LOW (ref 3.87–5.11)
RDW: 20.8 % — ABNORMAL HIGH (ref 11.5–15.5)
WBC: 17.6 K/uL — ABNORMAL HIGH (ref 4.0–10.5)
nRBC: 0 % (ref 0.0–0.2)

## 2024-09-13 LAB — TROPONIN I (HIGH SENSITIVITY): Troponin I (High Sensitivity): 5 ng/L (ref <18)

## 2024-09-13 LAB — FERRITIN: Ferritin: 10 ng/mL — ABNORMAL LOW (ref 11–307)

## 2024-09-13 LAB — MAGNESIUM: Magnesium: 1.5 mg/dL — ABNORMAL LOW (ref 1.7–2.4)

## 2024-09-13 MED ORDER — LEVETIRACETAM 750 MG PO TABS
750.0000 mg | ORAL_TABLET | Freq: Every morning | ORAL | Status: DC
  Administered 2024-09-13 – 2024-09-23 (×11): 750 mg via ORAL
  Filled 2024-09-13 (×11): qty 1

## 2024-09-13 MED ORDER — MORPHINE SULFATE (PF) 2 MG/ML IV SOLN
2.0000 mg | INTRAVENOUS | Status: DC | PRN
  Administered 2024-09-13 (×2): 2 mg via INTRAVENOUS
  Filled 2024-09-13 (×2): qty 1

## 2024-09-13 MED ORDER — POTASSIUM CHLORIDE 20 MEQ PO PACK
40.0000 meq | PACK | Freq: Once | ORAL | Status: AC
Start: 2024-09-13 — End: 2024-09-13
  Administered 2024-09-13: 40 meq via ORAL
  Filled 2024-09-13: qty 2

## 2024-09-13 MED ORDER — MAGNESIUM SULFATE IN D5W 1-5 GM/100ML-% IV SOLN
1.0000 g | Freq: Once | INTRAVENOUS | Status: AC
  Administered 2024-09-13: 1 g via INTRAVENOUS
  Filled 2024-09-13: qty 100

## 2024-09-13 MED ORDER — ALPRAZOLAM 0.25 MG PO TABS
0.2500 mg | ORAL_TABLET | Freq: Every evening | ORAL | Status: DC | PRN
  Administered 2024-09-13 – 2024-09-22 (×6): 0.25 mg via ORAL
  Filled 2024-09-13 (×6): qty 1

## 2024-09-13 MED ORDER — HYDROMORPHONE HCL 1 MG/ML IJ SOLN
1.0000 mg | INTRAMUSCULAR | Status: DC | PRN
  Administered 2024-09-13 – 2024-09-14 (×5): 2 mg via INTRAVENOUS
  Administered 2024-09-15 – 2024-09-16 (×4): 1 mg via INTRAVENOUS
  Administered 2024-09-16: 2 mg via INTRAVENOUS
  Administered 2024-09-16: 1 mg via INTRAVENOUS
  Administered 2024-09-17: 2 mg via INTRAVENOUS
  Filled 2024-09-13 (×3): qty 2
  Filled 2024-09-13 (×3): qty 1
  Filled 2024-09-13: qty 2
  Filled 2024-09-13 (×2): qty 1
  Filled 2024-09-13 (×3): qty 2

## 2024-09-13 MED ORDER — MORPHINE SULFATE (PF) 4 MG/ML IV SOLN
4.0000 mg | Freq: Once | INTRAVENOUS | Status: AC
  Administered 2024-09-13: 4 mg via INTRAVENOUS
  Filled 2024-09-13: qty 1

## 2024-09-13 MED ORDER — BUDESONIDE 0.25 MG/2ML IN SUSP
0.2500 mg | Freq: Two times a day (BID) | RESPIRATORY_TRACT | Status: DC
  Administered 2024-09-13 – 2024-09-23 (×20): 0.25 mg via RESPIRATORY_TRACT
  Filled 2024-09-13 (×20): qty 2

## 2024-09-13 MED ORDER — LINACLOTIDE 145 MCG PO CAPS
290.0000 ug | ORAL_CAPSULE | Freq: Every day | ORAL | Status: DC
  Administered 2024-09-14 – 2024-09-23 (×10): 290 ug via ORAL
  Filled 2024-09-13 (×8): qty 2
  Filled 2024-09-13: qty 1
  Filled 2024-09-13 (×2): qty 2

## 2024-09-13 MED ORDER — LIDOCAINE 5 % EX PTCH
1.0000 | MEDICATED_PATCH | CUTANEOUS | Status: DC
Start: 2024-09-13 — End: 2024-09-13
  Administered 2024-09-13: 1 via TRANSDERMAL
  Filled 2024-09-13: qty 1

## 2024-09-13 MED ORDER — POTASSIUM CHLORIDE IN NACL 40-0.9 MEQ/L-% IV SOLN
INTRAVENOUS | Status: DC
  Filled 2024-09-13 (×2): qty 1000

## 2024-09-13 MED ORDER — ATORVASTATIN CALCIUM 80 MG PO TABS
80.0000 mg | ORAL_TABLET | Freq: Every day | ORAL | Status: DC
  Administered 2024-09-13 – 2024-09-22 (×10): 80 mg via ORAL
  Filled 2024-09-13 (×10): qty 1

## 2024-09-13 MED ORDER — CITALOPRAM HYDROBROMIDE 10 MG PO TABS
40.0000 mg | ORAL_TABLET | Freq: Every morning | ORAL | Status: DC
  Administered 2024-09-14 – 2024-09-23 (×10): 40 mg via ORAL
  Filled 2024-09-13 (×9): qty 4

## 2024-09-13 MED ORDER — OXYCODONE HCL 5 MG PO TABS
10.0000 mg | ORAL_TABLET | Freq: Three times a day (TID) | ORAL | Status: DC | PRN
  Administered 2024-09-13 – 2024-09-20 (×16): 10 mg via ORAL
  Filled 2024-09-13 (×17): qty 2

## 2024-09-13 MED ORDER — SUVOREXANT 20 MG PO TABS: 1.0000 | ORAL_TABLET | Freq: Every day | ORAL | Status: DC

## 2024-09-13 MED ORDER — MORPHINE SULFATE (PF) 2 MG/ML IV SOLN
2.0000 mg | Freq: Once | INTRAVENOUS | Status: AC
  Administered 2024-09-13: 2 mg via INTRAVENOUS
  Filled 2024-09-13: qty 1

## 2024-09-13 MED ORDER — SODIUM CHLORIDE 0.9 % IV BOLUS
500.0000 mL | Freq: Once | INTRAVENOUS | Status: AC
  Administered 2024-09-13: 500 mL via INTRAVENOUS

## 2024-09-13 MED ORDER — BUSPIRONE HCL 10 MG PO TABS
15.0000 mg | ORAL_TABLET | Freq: Two times a day (BID) | ORAL | Status: DC
  Administered 2024-09-13 – 2024-09-23 (×21): 15 mg via ORAL
  Filled 2024-09-13 (×6): qty 2
  Filled 2024-09-13: qty 3
  Filled 2024-09-13 (×14): qty 2

## 2024-09-13 MED ORDER — SODIUM CHLORIDE 0.9 % IV BOLUS
500.0000 mL | Freq: Once | INTRAVENOUS | Status: AC
Start: 2024-09-13 — End: 2024-09-13
  Administered 2024-09-13: 500 mL via INTRAVENOUS

## 2024-09-13 MED ORDER — ACETAMINOPHEN 500 MG PO TABS
1000.0000 mg | ORAL_TABLET | Freq: Once | ORAL | Status: AC
  Administered 2024-09-13: 1000 mg via ORAL
  Filled 2024-09-13: qty 2

## 2024-09-13 MED ORDER — PANTOPRAZOLE SODIUM 40 MG IV SOLR
40.0000 mg | Freq: Two times a day (BID) | INTRAVENOUS | Status: DC
  Administered 2024-09-13 – 2024-09-16 (×7): 40 mg via INTRAVENOUS
  Filled 2024-09-13 (×7): qty 10

## 2024-09-13 MED ORDER — ALBUTEROL SULFATE (2.5 MG/3ML) 0.083% IN NEBU
3.0000 mL | INHALATION_SOLUTION | Freq: Four times a day (QID) | RESPIRATORY_TRACT | Status: DC | PRN
  Administered 2024-09-15: 3 mL via RESPIRATORY_TRACT
  Filled 2024-09-13: qty 9

## 2024-09-13 MED ORDER — FLUTICASONE FUROATE 100 MCG/ACT IN AEPB: 1.0000 | INHALATION_SPRAY | Freq: Every day | RESPIRATORY_TRACT | Status: DC

## 2024-09-13 NOTE — H&P (Signed)
 History and Physical    Jean Gomez FMW:[id number] DOB: July 13, 1956 DOA: 09/13/2024  PCP: Pcp, No  Patient coming from: home   Chief Complaint: fall  HPI: Jean Gomez is a 68 y.o. female with medical history significant for copd, on o2, seizures, chronic pain, polypharmacy, presents after fall.  [place] bowling [place] when lost her grip and fell striking right side of chest and right side of head. No laceration, no LOC. Otherwise feeling at her baseline, no fevers or nausea/vomiting. Reports dark Szuch stool, does appear to be taking asa and indocin .    Review of Systems: As per HPI otherwise 10 point review of systems negative.    Past Medical History:  Diagnosis Date   Acute postoperative pain 01/16/2019   Allergy    Asthma    Cerebral aneurysm    Chest pain 11/01/2016   Chronic migraine    Migraine   Chronic pain syndrome    Chronic respiratory failure (HCC)    Constipation    COPD (chronic obstructive pulmonary disease) (HCC)    Depression    major depression in remission   Hypercholesterolemia    Hypertension    Osteoarthritis    Pneumonia    12/2018   Sarcoid    Seizures (HCC)    Sleep apnea     Past Surgical History:  Procedure Laterality Date   ABDOMINAL HYSTERECTOMY     BACK SURGERY     spine fusion anterior lumbar with open posterior   BIOPSY  07/02/2023   Procedure: BIOPSY;  Surgeon: Maryruth Ole DASEN, MD;  Location: [facility] ENDOSCOPY;  Service: Endoscopy;;   CHOLECYSTECTOMY     COLONOSCOPY WITH PROPOFOL  N/A 08/19/2019   Procedure: COLONOSCOPY WITH PROPOFOL ;  Surgeon: Gaylyn Gladis PENNER, MD;  Location: [facility] ENDOSCOPY;  Service: Endoscopy;  Laterality: N/A;   ESOPHAGOGASTRODUODENOSCOPY (EGD) WITH PROPOFOL  N/A 08/19/2019   Procedure: ESOPHAGOGASTRODUODENOSCOPY (EGD) WITH PROPOFOL ;  Surgeon: Gaylyn Gladis PENNER, MD;  Location: [facility] ENDOSCOPY;  Service: Endoscopy;  Laterality: N/A;   ESOPHAGOGASTRODUODENOSCOPY (EGD) WITH PROPOFOL  N/A 08/04/2020    Procedure: ESOPHAGOGASTRODUODENOSCOPY (EGD) WITH PROPOFOL ;  Surgeon: Dessa Reyes ORN, MD;  Location: [facility] ENDOSCOPY;  Service: Endoscopy;  Laterality: N/A;   ESOPHAGOGASTRODUODENOSCOPY (EGD) WITH PROPOFOL  N/A 11/05/2020   Procedure: ESOPHAGOGASTRODUODENOSCOPY (EGD) WITH PROPOFOL ;  Surgeon: Dessa Reyes ORN, MD;  Location: [facility] ENDOSCOPY;  Service: Endoscopy;  Laterality: N/A;   ESOPHAGOGASTRODUODENOSCOPY (EGD) WITH PROPOFOL  N/A 07/02/2023   Procedure: ESOPHAGOGASTRODUODENOSCOPY (EGD) WITH PROPOFOL ;  Surgeon: Maryruth Ole DASEN, MD;  Location: [facility] ENDOSCOPY;  Service: Endoscopy;  Laterality: N/A;   ESOPHAGOGASTRODUODENOSCOPY (EGD) WITH PROPOFOL  N/A 10/01/2023   Procedure: ESOPHAGOGASTRODUODENOSCOPY (EGD) WITH PROPOFOL ;  Surgeon: Jinny Carmine, MD;  Location: [facility] ENDOSCOPY;  Service: Endoscopy;  Laterality: N/A;   SPINAL FUSION       reports that she has never smoked. She has never used smokeless tobacco. She reports current alcohol use. She reports that she does not use drugs.  Allergies  Allergen Reactions   Penicillins Rash, Hives and Itching   Contrast Media [Iodinated Contrast Media] Swelling   Gabapentin    Labetalol Other (See Comments)    Made hair fall out   Lactose Intolerance (Gi)     Bloating and diarrhea    Sulfabenzamide Nausea Only   Pregabalin  Other (See Comments)    The patient complained of hemifacial numbness with the use of the medication.    Family History  Problem Relation Age of Onset   Dementia Mother    Breast cancer  Mother    Throat cancer Father     Prior to Admission medications   Medication Sig Start Date End Date Taking? Authorizing Provider  albuterol  (PROVENTIL ) (2.5 MG/3ML) 0.083% nebulizer solution Take 2.5 mg by nebulization every 6 (six) hours as needed for wheezing or shortness of breath.    [provider]  Albuterol  Sulfate 108 (90 Base) MCG/ACT AEPB Inhale 2 puffs into the lungs every 6 (six) hours as needed.    [provider]  ALPRAZolam  (XANAX ) 0.25 MG tablet Take 0.25 mg by mouth at bedtime as needed for sleep.    [provider]  amLODipine -olmesartan  (AZOR ) 5-40 MG tablet Take 1 tablet by mouth every morning. 07/28/24   Albina GORMAN Dine, MD  aspirin  EC 81 MG tablet Take 1 tablet (81 mg total) by mouth daily. 04/19/18   Clapacs, Norleen DASEN, MD  atorvastatin  (LIPITOR) 80 MG tablet Take 1 tablet (80 mg total) by mouth at bedtime. 07/28/24   Albina GORMAN Dine, MD  benzonatate  (TESSALON ) 100 MG capsule TAKE 1 CAPSULE BY MOUTH 3 TIMES DAILY ASNEEDED FOR UP TO 10 DAYS FOR COUGH 02/20/24   Albina GORMAN Dine, MD  busPIRone  (BUSPAR ) 15 MG tablet Take 1 tablet (15 mg total) by mouth 2 (two) times daily. 07/28/24 10/26/24  Albina GORMAN Dine, MD  calcium  carbonate (OS-CAL) 1250 (500 Ca) MG chewable tablet Chew 1 tablet by mouth daily.    [provider]  citalopram  (CELEXA ) 40 MG tablet Take 1 tablet (40 mg total) by mouth every morning. 07/28/24 10/26/24  Albina GORMAN Dine, MD  dicyclomine  (BENTYL ) 20 MG tablet Take 1 tablet (20 mg total) by mouth every 6 (six) hours as needed for up to 7 days for spasms. 06/26/23 07/28/24  Levander Slate, MD  diphenhydrAMINE  (BENADRYL ) 25 MG tablet Take 25 mg by mouth every 6 (six) hours as needed.    [provider]  donepezil  (ARICEPT ) 10 MG tablet Take 1 tablet (10 mg total) by mouth every morning. 07/28/24 10/26/24  Albina GORMAN Dine, MD  Elastic Bandages & Supports (MEDICAL COMPRESSION STOCKINGS) MISC Please provide compression stockings 12/30/18   Dorothyann Drivers, MD  fluticasone  (FLONASE ) 50 MCG/ACT nasal spray Place 1 spray into both nostrils daily. 08/04/24 11/02/24  Albina GORMAN Dine, MD  Fluticasone  Furoate (ARNUITY ELLIPTA ) 100 MCG/ACT AEPB TAKE 1 INHALATION BY MOUTH ONCE DAILY 05/13/24   Tejan-[name], S Ahmed, MD  hydrochlorothiazide  (HYDRODIURIL ) 25 MG tablet TAKE 1 TABLET BY MOUTH EVERY MORNING 04/07/24   Tejan-[name], S Ahmed, MD  indomethacin  (INDOCIN )  50 MG capsule Take 1 capsule (50 mg total) by mouth 3 (three) times daily as needed. 07/28/24 10/26/24  Albina GORMAN Dine, MD  lactulose (CHRONULAC) 10 GM/15ML solution TAKE (3 TEASPOONFULS) BY MOUTH TWICE DAILY AS NEEDED 06/03/24   Tejan-[name], S Ahmed, MD  levETIRAcetam  (KEPPRA ) 750 MG tablet TAKE 1 TABLET BY MOUTH EVERY MORNING 08/19/24   Albina GORMAN Dine, MD  linaclotide  (LINZESS ) 290 MCG CAPS capsule Take 1 capsule (290 mcg total) by mouth daily before breakfast. 07/28/24 10/26/24  Albina GORMAN Dine, MD  mirtazapine  (REMERON ) 15 MG tablet TAKE ONE TABLET BY MOUTH AT BEDTIME 08/19/24   Albina GORMAN Dine, MD  nitroGLYCERIN  (NITROSTAT ) 0.4 MG SL tablet Place 0.4 mg under the tongue every 5 (five) minutes as needed for chest pain.    [provider]  ondansetron  (ZOFRAN -ODT) 4 MG disintegrating tablet Take 1 tablet (4 mg total) by mouth every 8 (eight) hours as needed for nausea  or vomiting. 10/23/23   Darci Pore, MD  Oxycodone  HCl 10 MG TABS Take 10 mg by mouth 3 (three) times daily as needed. 10/15/23   [provider]  OXYGEN  Inhale 2 L/min into the lungs continuous.    [provider]  pantoprazole  (PROTONIX ) 40 MG tablet Take 1 tablet (40 mg total) by mouth 2 (two) times daily. 07/28/24 10/26/24  Albina GORMAN Dine, MD  potassium chloride  SA (KLOR-CON  M) 20 MEQ tablet TAKE ONE TABLET BY MOUTH EVERY MORNING 09/18/23   Albina GORMAN Dine, MD  promethazine  (PHENERGAN ) 25 MG tablet Take 1 tablet (25 mg total) by mouth every 6 (six) hours as needed for nausea or vomiting. 03/17/24 07/28/24  Albina GORMAN Dine, MD  sucralfate  (CARAFATE ) 1 g tablet Take 1 tablet (1 g total) by mouth 4 (four) times daily. 07/28/24 10/26/24  Albina GORMAN Dine, MD  Suvorexant  (BELSOMRA ) 20 MG TABS Take 1 tablet (20 mg total) by mouth at bedtime. 07/28/24   Albina GORMAN Dine, MD  tiZANidine  (ZANAFLEX ) 4 MG tablet Take 1 tablet (4 mg total) by mouth 3 (three) times daily. Patient taking  differently: Take 4 mg by mouth 3 (three) times daily as needed. 03/29/20 07/28/24  Tanya Glisson, MD    Physical Exam: Vitals:   09/13/24 0651 09/13/24 0700 09/13/24 0730 09/13/24 0800  BP:  117/74 111/75 124/79  Pulse:  (!) 117 (!) 115 (!) 114  Resp:  (!) 27 (!) 25 (!) 21  Temp:      TempSrc:      SpO2:  98% 99% 100%  Weight: 80.3 kg       Constitutional: No acute distress Head: Atraumatic Eyes: Conjunctiva clear ENM: Moist mucous membranes. Normal dentition.  Neck: Supple Respiratory: Clear to auscultation bilaterally, no wheezing/rales/rhonchi. Mild tachypnea Cardiovascular: Regular rate and rhythm. No murmurs/rubs/gallops. Abdomen: Non-tender, non-distended. No masses. No rebound or guarding.   Musculoskeletal: No joint deformity upper and lower extremities. Normal ROM, no contractures  Skin: No rashes, lesions, or ulcers.  Extremities: No peripheral edema. Palpable peripheral pulses. Neurologic: Alert, moving all 4 extremities. Psychiatric: Normal insight and judgement.   Labs on Admission: I have personally reviewed following labs and imaging studies  CBC: Recent Labs  Lab 09/13/24 0615  WBC 17.6*  NEUTROABS 13.9*  HGB 7.6*  HCT 25.4*  MCV 75.1*  PLT 608*   Basic Metabolic Panel: Recent Labs  Lab 09/13/24 0615  NA 139  K 3.0*  CL 100  CO2 23  GLUCOSE 181*  BUN 21  CREATININE 1.31*  CALCIUM  8.6*  MG 1.5*   GFR: Estimated Creatinine Clearance: 41.3 mL/min (A) (by C-G formula based on SCr of 1.31 mg/dL (H)). Liver Function Tests: Recent Labs  Lab 09/13/24 0615  AST 31  ALT 14  ALKPHOS 66  BILITOT 0.4  PROT 6.9  ALBUMIN  2.6*   No results for input(s): LIPASE, AMYLASE in the last 168 hours. No results for input(s): AMMONIA in the last 168 hours. Coagulation Profile: No results for input(s): INR, PROTIME in the last 168 hours. Cardiac Enzymes: No results for input(s): CKTOTAL, CKMB, CKMBINDEX, TROPONINI in the last 168  hours. BNP (last 3 results) No results for input(s): PROBNP in the last 8760 hours. HbA1C: No results for input(s): HGBA1C in the last 72 hours. CBG: No results for input(s): GLUCAP in the last 168 hours. Lipid Profile: No results for input(s): CHOL, HDL, LDLCALC, TRIG, CHOLHDL, LDLDIRECT in the last 72 hours. Thyroid  Function Tests: No results for input(s): TSH,  T4TOTAL, FREET4, T3FREE, THYROIDAB in the last 72 hours. Anemia Panel: No results for input(s): VITAMINB12, FOLATE, FERRITIN, TIBC, IRON, RETICCTPCT in the last 72 hours. Urine analysis:    Component Value Date/Time   COLORURINE YELLOW (A) 10/20/2023 2005   APPEARANCEUR CLOUDY (A) 10/20/2023 2005   LABSPEC 1.006 10/20/2023 2005   PHURINE 6.0 10/20/2023 2005   GLUCOSEU 50 (A) 10/20/2023 2005   HGBUR SMALL (A) 10/20/2023 2005   BILIRUBINUR negative 07/28/2024 1414   KETONESUR NEGATIVE 10/20/2023 2005   PROTEINUR Negative 07/28/2024 1414   PROTEINUR 30 (A) 10/20/2023 2005   UROBILINOGEN 0.2 07/28/2024 1414   NITRITE negative 07/28/2024 1414   NITRITE NEGATIVE 10/20/2023 2005   LEUKOCYTESUR Trace (A) 07/28/2024 1414   LEUKOCYTESUR LARGE (A) 10/20/2023 2005    Radiological Exams on Admission: DG Knee Complete 4 Views Right Result Date: 09/13/2024 EXAM: 4 VIEW(S) XRAY OF THE RIGHT KNEE 09/13/2024 07:27:00 AM COMPARISON: None available. CLINICAL HISTORY: fall. Right knee pain after a fall while trying to get into car 1 day ago FINDINGS: BONES AND JOINTS: No acute fracture. No focal osseous lesion. No joint dislocation. Small bursal effusion. Marginal spurring with mild joint space loss. 1.9 x 1.7 x 1.7 cm well corticated intra-articular body posteriorly in the joint space. SOFT TISSUES: Femoral arterial calcifications. IMPRESSION: 1. Small bursal effusion. 2. Well-corticated 1.9 cm intra-articular body in the posterior joint space. 3. No acute fracture or dislocation. Electronically signed  by: Waddell Calk MD 09/13/2024 07:31 AM EDT RP Workstation: [id number]   CT CHEST ABDOMEN PELVIS WO CONTRAST Result Date: 09/13/2024 EXAM: CT CHEST, ABDOMEN AND PELVIS WITHOUT CONTRAST 09/13/2024 06:40:18 AM TECHNIQUE: CT of the chest, abdomen and pelvis was performed without the administration of intravenous contrast. Multiplanar reformatted images are provided for review. Automated exposure control, iterative reconstruction, and/or weight based adjustment of the mA/kV was utilized to reduce the radiation dose to as low as reasonably achievable. COMPARISON: CT abdomen and pelvis 10/20/2023. CLINICAL HISTORY: Polytrauma, blunt. Table formatting from the original note was not included.; Images from the original note were not included.; Per ed notes; BIB family POV after patient fell last night around 2030 and is now C/O right rib pain and pain under right breast that hurts worse with movement or deep breaths. + head strike, + LOC. Not on blood thinners FINDINGS: CHEST: MEDIASTINUM AND LYMPH NODES: Normal heart size. Increased caliber of the main pulmonary artery measures 3.5 cm. Aortic atherosclerosis. Coronary artery calcifications. The central airways are clear. No mediastinal, hilar or axillary lymphadenopathy. LUNGS AND PLEURA: Bilateral areas of bronchiectasis, scarring and volume loss identified. Scar versus subsegmental atelectasis in the right lung base. No focal consolidation or pulmonary edema. No pneumothorax, pleural effusions or signs of pulmonary contusion. ABDOMEN AND PELVIS: LIVER: The liver is unremarkable. GALLBLADDER AND BILE DUCTS: Status post cholecystectomy. Caliber of the common bile duct measures 8 mm. This is similar to the previous exam. In the absence of signs or symptoms of biliary obstruction this is favored to represent post cholecystectomy physiology. 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 Spleen appears normal. PANCREAS: No acute abnormality. ADRENAL  GLANDS: No acute abnormality. KIDNEYS, URETERS AND BLADDER: No stones in the kidneys or ureters. No hydronephrosis. No perinephric or periureteral stranding. Urinary bladder is unremarkable. GI AND BOWEL: Stomach demonstrates no acute abnormality. No dilated loops of large or small bowel. There is no pathologic dilatation of the bowel. Moderate stool burden is identified within the cecum and ascending colon. There is  no bowel obstruction. REPRODUCTIVE ORGANS: The uterus is surgically absent. No adnexal mass. PERITONEUM AND RETROPERITONEUM: No free fluid or fluid collections. No free air. VASCULATURE: Aorta is normal in caliber. Aortic atherosclerosis. ABDOMINAL AND PELVIS LYMPH NODES: No lymphadenopathy. BONES AND SOFT TISSUES: Multiple, acute right anterior lateral rib fractures including the right third, fourth, fifth, sixth, and seventh ribs. There is also a fracture involving the anterior aspect of the left second rib. Mild age-indeterminate superior endplate deformity involves the T4 vertebra without signs of retropulsion of fracture fragments, image 59 of the sagittal series. Lumbar vertebral body heights are all well maintained. First-degree anterolisthesis is identified at L5 on S1. No focal soft tissue abnormality. IMPRESSION: 1. Multiple acute right anterolateral rib fractures involving the right third through seventh ribs, and an acute fracture of the anterior left second rib. 2. Mild age-indeterminate compression deformity involving the T4 vertebra. 3. No pneumothorax, pleural effusion, or pulmonary contusion detected. 4. No signs of solid organ injury within the abdomen and pelvis. No free fluid noted. Electronically signed by: Waddell Calk MD 09/13/2024 07:00 AM EDT RP Workstation: [id number]   CT Cervical Spine Wo Contrast Result Date: 09/13/2024 CLINICAL DATA:  68 year old female status post fall last night at 2030 hours, right side pain, struck head, positive loss of consciousness. EXAM: CT  CERVICAL SPINE WITHOUT CONTRAST TECHNIQUE: Multidetector CT imaging of the cervical spine was performed without intravenous contrast. Multiplanar CT image reconstructions were also generated. RADIATION DOSE REDUCTION: This exam was performed according to the departmental dose-optimization program which includes automated exposure control, adjustment of the mA and/or kV according to patient size and/or use of iterative reconstruction technique. COMPARISON:  Head CT today.  Cervical spine CT 08/22/2020. FINDINGS: Alignment: Stable straightening of cervical lordosis and chronic degenerative anterolisthesis of C4 on C5. Cervicothoracic junction alignment is within normal limits. Bilateral posterior element alignment is within normal limits. Skull base and vertebrae: Visualized skull base is intact. No atlanto-occipital dissociation. Congenital incomplete ossification of the posterior C1 ring, normal variant. Intermittent mild motion artifact. C1 and C2 appear intact and aligned. No acute osseous abnormality identified. Soft tissues and spinal canal: No prevertebral fluid or swelling. No visible canal hematoma. Retropharyngeal cervical carotids. Motion artifact in the bilateral neck. Disc levels: Stable chronic cervical spine degeneration, predominantly bilateral facet arthropathy. Chronic degenerative anterolisthesis of C4 on C5. Moderate to advanced chronic disc and endplate degeneration at C5-C6. 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 Motion artifact. Chest CT today reported separately. IMPRESSION: Mild motion artifact. No acute traumatic injury identified in the cervical spine. Electronically Signed   By: VEAR Hurst M.D.   On: 09/13/2024 06:50   CT Head Wo Contrast Result Date: 09/13/2024 CLINICAL DATA:  68 year old female status post fall last night at 2030 hours, right side pain, struck head, positive loss of consciousness. EXAM: CT HEAD WITHOUT CONTRAST  TECHNIQUE: Contiguous axial images were obtained from the base of the skull through the vertex without intravenous contrast. RADIATION DOSE REDUCTION: This exam was performed according to the departmental dose-optimization program which includes automated exposure control, adjustment of the mA and/or kV according to patient size and/or use of iterative reconstruction technique. COMPARISON:  Head CT 08/22/2020. FINDINGS: Brain: Cerebral volume is stable, within normal limits for age. No midline shift, mass effect, or evidence of intracranial mass lesion. No ventriculomegaly. No acute intracranial hemorrhage identified. Chronic basal ganglia vascular calcifications. Patchy hypodensity scattered in the bilateral cerebral white matter and bilateral basal ganglia is stable,  moderate for age. No cortically based acute infarct identified. Vascular: No suspicious intracranial vascular hyperdensity. Advanced Calcified atherosclerosis at the skull base. Skull: Stable and intact. Congenital incomplete ossification of the posterior C1 ring, normal variant. Sinuses/Orbits: Similar low-density fluid and/or mucosal thickening in the right paranasal sinuses, regressed in the right sphenoid sinus rim last month. Tympanic cavities and mastoids well aerated. Other: Asymmetric mild forehead soft tissue swelling and stranding now greater on the right (series 3, image 32). Underlying frontal bones and frontal sinuses appear intact. No scalp soft tissue gas. Stable orbits soft tissues. IMPRESSION: 1. Mild right forehead soft tissue injury. No skull fracture. 2. No acute intracranial abnormality. Stable non contrast CT appearance of chronic small vessel disease. Electronically Signed   By: VEAR Hurst M.D.   On: 09/13/2024 06:47    EKG: Independently reviewed. Sinus tach  Assessment/Plan Principal Problem:   Rib fractures Active Problems:   Chronic pain syndrome   Chronic musculoskeletal pain   COPD (chronic obstructive pulmonary  disease) (HCC)   Chronic respiratory failure with hypoxia (HCC)   Epilepsy (HCC)   Obstructive sleep apnea on CPAP   Sarcoidosis   Bipolar affective disorder, current episode manic (HCC)   Essential hypertension   Dementia, presenile with depression (HCC)   # Rib fractures Right 3-7 and left 2nd after fall, no complications. No other apparent significant injury, ct head and neck, right knee all neg - pain control - IS - PT eval  # Acute on chronic microcytic anemia Ulcer disease seen on egd last year, hgb 7s from baseline 9-10. Appears taking asa and indocin  - clears - ppi bid - will speak with gi  # Hypokalemia # Hypomagnesemia - replete, start fluids with K  # AKI Mild, 1.31 from baseline of normal - gentle fluids  # COPD # Chronic hypoxic respiratory failure No exacerbation, stable on home 3 liters - continue home o2 and home inhalers  # Leukocytosis No signs/symptoms of infection otherwise. Possibly reactive - monitor  # Chronic pain - cont home oxy, hold home indocin  - morphine  prn for rib fracture  # Dementia - hold home aricept  given qtc prolongation  # prolonged qtc - will hold aricept  and mirtazapine  for now  # MDD/gad - hold home mirtaz as above - cont home celexa , buspar , xanax   # Insomnia - home belsomra   # Chronic constipation - home linzess   # Seizure disorder - home keppra   # HTN Bp low normal - home meds on hold  DVT prophylaxis: SCDs Code Status: DNR/DNI, confirmed with patient  Family Communication: none at bedside  Consults called: none   Level of care: Med-Surg Status is: Observation    Devaughn KATHEE Ban MD [facility] Pager [phone]  If 7PM-7AM, please contact night-coverage [email or web address] Password [email or web address]  09/13/2024, 8:18 AM

## 2024-09-13 NOTE — ED Notes (Signed)
 Pt placed on bedpan as trying to get up to restroom was causing to much pain.

## 2024-09-13 NOTE — ED Provider Notes (Addendum)
 Care assumed of patient from outgoing provider.  See their note for initial history, exam and plan.  Clinical Course as of 09/13/24 0733  Sat Sep 13, 2024  9342 CT Cervical Spine Wo Contrast IMPRESSION: Mild motion artifact. No acute traumatic injury identified in the cervical spine.   [TT]  0657 CT Head Wo Contrast IMPRESSION: 1. Mild right forehead soft tissue injury. No skull fracture. 2. No acute intracranial abnormality. Stable non contrast CT appearance of chronic small vessel disease.   [TT]  0703 History of chronic pain, nonsyncopal fall today with pain to the R side.  No on anticoag.  CT head, c spine neg. CTA CAP wo con w mult rib fxs bilaterally.  Labs pending. Will need admission for rib fx and pain control.  [SM]    Clinical Course User Index [SM] Suzanne Kirsch, MD [TT] Waymond Lorelle Cummins, MD   On reevaluation patient with significant pain.  Given another dose of IV morphine .  Continues to be tachycardic.  Will give another fluid bolus.  Patient's lab work resulted with significant leukocytosis likely in the setting of trauma.  Creatinine appears to be close to her baseline but mildly elevated at 1.3 from a baseline of 0.8.  Normal BUN.  Potassium mildly low at 3.0.  Magnesium  added on.  Troponin negative have low suspicion for ACS.  Does have a hemoglobin of 7.6 which appears to be slowly downtrending compared to prior.  Do not feel that she needs blood transfusion at this time.  Platelets elevated at 608.  Consulted hospitalist for admission for fall for rib fractures.  Questionable T4 compression fracture.  Given potassium magnesium  replacement.  Incentive spirometer ordered.   Suzanne Kirsch, MD 09/13/24 9263    Suzanne Kirsch, MD 09/13/24 [phone]

## 2024-09-13 NOTE — Consult Note (Signed)
 [facility] GI Inpatient Consult Note   Ladell Francis Boss, M.D.  Reason for Consult: Progressive Anemia   Attending Requesting Consult: Devaughn Ban, M.D.  Outpatient Primary Physician: Cinderella Perry, M.D.  History of Present Illness: Jean Gomez is a 68 y.o. female with a history of dementia who was admitted to the hospital this morning after suffering a fall yesterday evening [place].  She was found to have fractured right ribs as well as a prolonged QT interval.  Patient admitted to for medical care of her injury along with management of acute kidney injury and hypokalemia. Patient was found to have a hemoglobin of 7.6 at hospital admission without signs or recent symptoms of gastrointestinal bleeding.  Patient does have a baseline anemia with a base line and hemoglobin are between 9 and 10.  Patient has had hemoglobins low in the past without symptoms of bleeding, last on 09/29/2023 when hemoglobin was 7.9.  Patient underwent upper endoscopy at that time by Dr. Jinny which revealed clean-based ulcer in the stomach without stigmata of active or recent bleeding.  Hemoglobin appeared to spontaneously improve and was self-limited at that time. The GI service is again asked to see the patient for recurrent anemia without overt GI symptoms.  Past Medical History:  Past Medical History:  Diagnosis Date   Acute postoperative pain 01/16/2019   Allergy    Asthma    Cerebral aneurysm    Chest pain 11/01/2016   Chronic migraine    Migraine   Chronic pain syndrome    Chronic respiratory failure (HCC)    Constipation    COPD (chronic obstructive pulmonary disease) (HCC)    Depression    major depression in remission   Hypercholesterolemia    Hypertension    Osteoarthritis    Pneumonia    12/2018   Sarcoid    Seizures (HCC)    Sleep apnea     Problem List: Patient Active Problem List   Diagnosis Date Noted   Rib fractures 09/13/2024   Acute on chronic anemia  09/13/2024   Hypokalemia 10/23/2023   Syncope 10/20/2023   Sepsis due to gram-negative UTI (HCC) 10/20/2023   Dyslipidemia 10/20/2023   Essential hypertension 10/20/2023   Peptic ulcer disease 10/20/2023   Dementia, presenile with depression (HCC) 10/20/2023   Acute blood loss anemia 10/16/2023   Dehydration 10/16/2023   Acute gastric ulcer without hemorrhage or perforation 10/01/2023   Acute GI bleeding 09/29/2023   Generalized weakness 09/29/2023   Diarrhea 08/30/2023   Sepsis (HCC) 08/30/2023   Multiple gastric ulcers 08/30/2023   AKI (acute kidney injury) 08/30/2023   Anemia of chronic disease 08/30/2023   Other spondylosis, sacral and sacrococcygeal region 07/01/2020   History of Allergy to iodine 01/27/2020    Class: History of   Lumbosacral spondylosis with radiculopathy (Right) 01/05/2020   Neurogenic pain 10/29/2019   Osteoarthritis of hip (Right) 10/09/2019   Greater trochanteric bursitis (Right) 10/09/2019   Lumbosacral spinal stenosis 10/09/2019   Lumbosacral (intervertebral disc displacement) (IVDD) 10/09/2019   Lumbar nerve root impingement (Left: L4) (Right: L5) 10/09/2019   Grade 1 Anterolisthesis of L5/S1 10/09/2019   Lumbar facet arthropathy (L3-4, L4-5, L5-S1) 10/09/2019   Lumbosacral facet joint synovial cyst (Right: L5-S1) 10/09/2019   Lumbar foraminal stenosis (Bilateral: L2-S1) 07/10/2019   Abnormal MRI, lumbar spine (07/02/2019) 07/03/2019   Disturbance of skin sensation 06/16/2019   Chronic low back pain (Secondary Area of Pain) (Bilateral) (R>L) w/o sciatica 01/16/2019  Acute on chronic respiratory failure with hypoxia (HCC) 12/18/2018   Cervicogenic headache 10/21/2018   Lumbar spondylosis 06/25/2018   History of allergy to radiographic contrast media 05/30/2018   Bipolar affective disorder, current episode manic (HCC) 04/17/2018   Chronic hip pain (Tertiary Area of Pain) (Bilateral) (R>L) 02/26/2018   DDD (degenerative disc disease), cervical  02/26/2018   Cervical facet arthropathy (Bilateral) 02/26/2018   Cervical facet syndrome (Bilateral) (R>L) 02/26/2018   Cervical Grade 1 Anterolisthesis of C4 over C5 (Degenerative) 02/26/2018   Cervical Grade 1 Retrolisthesis of C5 over C6 02/26/2018   Cervical foraminal stenosis (C3-4, C4-5, and C5-6) (Bilateral) 02/26/2018   DDD (degenerative disc disease), lumbosacral 02/26/2018   Osteoarthritis of lumbar spine 02/26/2018   Osteoarthritis of facet joint of lumbar spine 02/26/2018   Spondylosis without myelopathy or radiculopathy, cervicothoracic region 02/26/2018   Occipital headache (Right) 02/25/2018   Frontal headache (Left) 02/25/2018   Chronic low back pain (Midline) (Secondary Area of Pain) 02/25/2018   Chronic lumbar radiculopathy (Right) 02/25/2018   Osteoarthritis involving multiple joints 02/25/2018   Osteoarthritis of shoulder (Bilateral) 02/25/2018   Chronic musculoskeletal pain 02/25/2018   Lumbar facet syndrome (Bilateral) (R>L) 02/25/2018   Vitamin D  deficiency 02/14/2018   Chronic pain syndrome 02/07/2018   Pharmacologic therapy 02/07/2018   Disorder of skeletal system 02/07/2018   Problems influencing health status 02/07/2018   Long term current use of opiate analgesic 02/07/2018   Chronic low back pain (Secondary Area of Pain) (Bilateral) (R>L) w/ sciatica (Right) 02/07/2018   Chronic lower extremity pain (Tertiary Area of Pain) (Bilateral) (R>L) 02/07/2018   Chronic knee pain (Fourth Area of Pain) (Bilateral) (R>L) 02/07/2018   Chronic shoulder pain (Fifth Area of Pain) (Bilateral) (R>L) 02/07/2018   Occipital neuralgia (Primary Area of Pain) (Bilateral) (R>L) 02/07/2018   Chronic neck pain (Bilateral) (R>L) 02/07/2018   Chronic sacroiliac joint pain (Bilateral) (R>L) 02/07/2018   H/O aneurysm 01/30/2018   History of depression 01/30/2018   History of seizure disorder 01/30/2018   Chronic headache disorder (Primary Area of Pain) 01/30/2018   Chronic migraine  without aura 01/17/2018   Chronic respiratory failure with hypoxia (HCC) 01/17/2018   Constipation 01/17/2018   HTN, goal below 140/90 01/17/2018   Hypercholesterolemia 01/17/2018   Major depression in remission 01/17/2018   COPD (chronic obstructive pulmonary disease) (HCC) 01/17/2018   Sarcoidosis 01/17/2018   Seizure disorder (HCC) 01/17/2018   Cerebral aneurysm 01/17/2018   Nonruptured cerebral aneurysm 10/22/2017   Obstructive sleep apnea on CPAP 09/11/2017   Generalized anxiety disorder 01/26/2017   Irritable bowel syndrome 01/26/2017   Pulmonary emphysema (HCC) 01/26/2017   GERD (gastroesophageal reflux disease) 08/02/2016   Hiatal hernia 08/02/2016   Essential tremor 07/19/2016   Muscle spasm 10/05/2015   Spondylosis without myelopathy or radiculopathy, lumbosacral region 10/05/2015   Rotator cuff injury 09/17/2015   Chronic shoulder pain (Right) 09/17/2015   Epilepsy (HCC) 07/14/2015   Insomnia 07/14/2015   Osteoarthritis of knee (Bilateral) (R>L) 06/24/2015    Past Surgical History: Past Surgical History:  Procedure Laterality Date   ABDOMINAL HYSTERECTOMY     BACK SURGERY     spine fusion anterior lumbar with open posterior   BIOPSY  07/02/2023   Procedure: BIOPSY;  Surgeon: Maryruth Ole DASEN, MD;  Location: [facility] ENDOSCOPY;  Service: Endoscopy;;   CHOLECYSTECTOMY     COLONOSCOPY WITH PROPOFOL  N/A 08/19/2019   Procedure: COLONOSCOPY WITH PROPOFOL ;  Surgeon: Gaylyn Gladis PENNER, MD;  Location: [facility] ENDOSCOPY;  Service: Endoscopy;  Laterality: N/A;  ESOPHAGOGASTRODUODENOSCOPY (EGD) WITH PROPOFOL  N/A 08/19/2019   Procedure: ESOPHAGOGASTRODUODENOSCOPY (EGD) WITH PROPOFOL ;  Surgeon: Gaylyn Gladis PENNER, MD;  Location: [facility] ENDOSCOPY;  Service: Endoscopy;  Laterality: N/A;   ESOPHAGOGASTRODUODENOSCOPY (EGD) WITH PROPOFOL  N/A 08/04/2020   Procedure: ESOPHAGOGASTRODUODENOSCOPY (EGD) WITH PROPOFOL ;  Surgeon: Dessa Reyes ORN, MD;  Location: [facility] ENDOSCOPY;  Service: Endoscopy;   Laterality: N/A;   ESOPHAGOGASTRODUODENOSCOPY (EGD) WITH PROPOFOL  N/A 11/05/2020   Procedure: ESOPHAGOGASTRODUODENOSCOPY (EGD) WITH PROPOFOL ;  Surgeon: Dessa Reyes ORN, MD;  Location: [facility] ENDOSCOPY;  Service: Endoscopy;  Laterality: N/A;   ESOPHAGOGASTRODUODENOSCOPY (EGD) WITH PROPOFOL  N/A 07/02/2023   Procedure: ESOPHAGOGASTRODUODENOSCOPY (EGD) WITH PROPOFOL ;  Surgeon: Maryruth Ole DASEN, MD;  Location: [facility] ENDOSCOPY;  Service: Endoscopy;  Laterality: N/A;   ESOPHAGOGASTRODUODENOSCOPY (EGD) WITH PROPOFOL  N/A 10/01/2023   Procedure: ESOPHAGOGASTRODUODENOSCOPY (EGD) WITH PROPOFOL ;  Surgeon: Jinny Carmine, MD;  Location: [facility] ENDOSCOPY;  Service: Endoscopy;  Laterality: N/A;   SPINAL FUSION      Allergies: Allergies  Allergen Reactions   Penicillins Rash, Hives and Itching   Contrast Media [Iodinated Contrast Media] Swelling   Gabapentin    Labetalol Other (See Comments)    Made hair fall out   Lactose Intolerance (Gi)     Bloating and diarrhea    Sulfabenzamide Nausea Only   Pregabalin  Other (See Comments)    The patient complained of hemifacial numbness with the use of the medication.    Home Medications: (Not in a hospital admission)  Home medication reconciliation was completed with the patient.   Scheduled Inpatient Medications:    atorvastatin   80 mg Oral QHS   busPIRone   15 mg Oral BID   citalopram   40 mg Oral q morning   Fluticasone  Furoate  1 Inhalation Inhalation Q1500   levETIRAcetam   750 mg Oral q morning   linaclotide   290 mcg Oral QAC breakfast   pantoprazole  (PROTONIX ) IV  40 mg Intravenous Q12H   Suvorexant   1 tablet Oral QHS    Continuous Inpatient Infusions:    0.9 % NaCl with KCl 40 mEq / L 75 mL/hr at 09/13/24 0927    PRN Inpatient Medications:  albuterol , ALPRAZolam , morphine  injection, oxyCODONE   Family History: family history includes Breast cancer in her mother; Dementia in her mother; Throat cancer in her father.   GI Family History:  Negative  Social History:   reports that she has never smoked. She has never used smokeless tobacco. She reports current alcohol use. She reports that she does not use drugs. The patient denies ETOH, tobacco, or drug use.    Review of Systems: Review of Systems - Negative except history of present illness  Physical Examination: BP 122/73   Pulse (!) 110   Temp 98.8 F (37.1 C) (Oral)   Resp (!) 23   Wt 80.3 kg   SpO2 100%   BMI 31.35 kg/m  Physical Exam Constitutional:      General: She is not in acute distress.    Appearance: Normal appearance. She is not toxic-appearing.  HENT:     Head: Normocephalic and atraumatic.     Right Ear: External ear normal.     Left Ear: External ear normal.     Nose: Nose normal.     Mouth/Throat:     Mouth: Mucous membranes are moist.     Pharynx: Oropharynx is clear. No oropharyngeal exudate or posterior oropharyngeal erythema.  Eyes:     General: No scleral icterus.    Extraocular Movements: Extraocular movements intact.     Pupils: Pupils  are equal, round, and reactive to light.  Cardiovascular:     Rate and Rhythm: Normal rate.     Pulses: Normal pulses.     Heart sounds: No murmur heard.    No gallop.  Pulmonary:     Effort: Pulmonary effort is normal. No respiratory distress.     Breath sounds: No wheezing.  Abdominal:     General: There is no distension.     Palpations: Abdomen is soft. There is no mass.     Tenderness: There is no abdominal tenderness. There is no rebound.     Hernia: No hernia is present.  Musculoskeletal:        General: No tenderness or deformity.     Cervical back: No rigidity or tenderness.  Skin:    General: Skin is warm and dry.     Capillary Refill: Capillary refill takes less than 2 seconds.  Neurological:     General: No focal deficit present.     Mental Status: She is alert.  Psychiatric:        Mood and Affect: Mood normal.        Thought Content: Thought content normal.        Judgment:  Judgment normal.     Data: Lab Results  Component Value Date   WBC 17.6 (H) 09/13/2024   HGB 7.6 (L) 09/13/2024   HCT 25.4 (L) 09/13/2024   MCV 75.1 (L) 09/13/2024   PLT 608 (H) 09/13/2024   Recent Labs  Lab 09/13/24 0615  HGB 7.6*   Lab Results  Component Value Date   NA 139 09/13/2024   K 3.0 (L) 09/13/2024   CL 100 09/13/2024   CO2 23 09/13/2024   BUN 21 09/13/2024   CREATININE 1.31 (H) 09/13/2024   Lab Results  Component Value Date   ALT 14 09/13/2024   AST 31 09/13/2024   ALKPHOS 66 09/13/2024   BILITOT 0.4 09/13/2024   No results for input(s): APTT, INR, PTT in the last 168 hours.    Latest Ref Rng & Units 09/13/2024    6:15 AM 07/24/2024   11:53 AM 05/01/2024   10:34 AM  CBC  WBC 4.0 - 10.5 K/uL 17.6  9.1  7.9   Hemoglobin 12.0 - 15.0 g/dL 7.6  8.9  9.4   Hematocrit 36.0 - 46.0 % 25.4  30.7  32.7   Platelets 150 - 400 K/uL 608  454  582     STUDIES: DG Knee Complete 4 Views Right Result Date: 09/13/2024 EXAM: 4 VIEW(S) XRAY OF THE RIGHT KNEE 09/13/2024 07:27:00 AM COMPARISON: None available. CLINICAL HISTORY: fall. Right knee pain after a fall while trying to get into car 1 day ago FINDINGS: BONES AND JOINTS: No acute fracture. No focal osseous lesion. No joint dislocation. Small bursal effusion. Marginal spurring with mild joint space loss. 1.9 x 1.7 x 1.7 cm well corticated intra-articular body posteriorly in the joint space. SOFT TISSUES: Femoral arterial calcifications. IMPRESSION: 1. Small bursal effusion. 2. Well-corticated 1.9 cm intra-articular body in the posterior joint space. 3. No acute fracture or dislocation. Electronically signed by: Waddell Calk MD 09/13/2024 07:31 AM EDT RP Workstation: [id number]   CT CHEST ABDOMEN PELVIS WO CONTRAST Result Date: 09/13/2024 EXAM: CT CHEST, ABDOMEN AND PELVIS WITHOUT CONTRAST 09/13/2024 06:40:18 AM TECHNIQUE: CT of the chest, abdomen and pelvis was performed without the administration of intravenous  contrast. Multiplanar reformatted images are provided for review. Automated exposure control, iterative reconstruction, and/or weight based adjustment of  the mA/kV was utilized to reduce the radiation dose to as low as reasonably achievable. COMPARISON: CT abdomen and pelvis 10/20/2023. CLINICAL HISTORY: Polytrauma, blunt. Table formatting from the original note was not included.; Images from the original note were not included.; Per ed notes; BIB family POV after patient fell last night around 2030 and is now C/O right rib pain and pain under right breast that hurts worse with movement or deep breaths. + head strike, + LOC. Not on blood thinners FINDINGS: CHEST: MEDIASTINUM AND LYMPH NODES: Normal heart size. Increased caliber of the main pulmonary artery measures 3.5 cm. Aortic atherosclerosis. Coronary artery calcifications. The central airways are clear. No mediastinal, hilar or axillary lymphadenopathy. LUNGS AND PLEURA: Bilateral areas of bronchiectasis, scarring and volume loss identified. Scar versus subsegmental atelectasis in the right lung base. No focal consolidation or pulmonary edema. No pneumothorax, pleural effusions or signs of pulmonary contusion. ABDOMEN AND PELVIS: LIVER: The liver is unremarkable. GALLBLADDER AND BILE DUCTS: Status post cholecystectomy. Caliber of the common bile duct measures 8 mm. This is similar to the previous exam. In the absence of signs or symptoms of biliary obstruction this is favored to represent post cholecystectomy physiology. 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 Spleen appears normal. PANCREAS: No acute abnormality. ADRENAL GLANDS: No acute abnormality. KIDNEYS, URETERS AND BLADDER: No stones in the kidneys or ureters. No hydronephrosis. No perinephric or periureteral stranding. Urinary bladder is unremarkable. GI AND BOWEL: Stomach demonstrates no acute abnormality. No dilated loops of large or small bowel. There is no pathologic  dilatation of the bowel. Moderate stool burden is identified within the cecum and ascending colon. There is no bowel obstruction. REPRODUCTIVE ORGANS: The uterus is surgically absent. No adnexal mass. PERITONEUM AND RETROPERITONEUM: No free fluid or fluid collections. No free air. VASCULATURE: Aorta is normal in caliber. Aortic atherosclerosis. ABDOMINAL AND PELVIS LYMPH NODES: No lymphadenopathy. BONES AND SOFT TISSUES: Multiple, acute right anterior lateral rib fractures including the right third, fourth, fifth, sixth, and seventh ribs. There is also a fracture involving the anterior aspect of the left second rib. Mild age-indeterminate superior endplate deformity involves the T4 vertebra without signs of retropulsion of fracture fragments, image 59 of the sagittal series. Lumbar vertebral body heights are all well maintained. First-degree anterolisthesis is identified at L5 on S1. No focal soft tissue abnormality. IMPRESSION: 1. Multiple acute right anterolateral rib fractures involving the right third through seventh ribs, and an acute fracture of the anterior left second rib. 2. Mild age-indeterminate compression deformity involving the T4 vertebra. 3. No pneumothorax, pleural effusion, or pulmonary contusion detected. 4. No signs of solid organ injury within the abdomen and pelvis. No free fluid noted. Electronically signed by: Waddell Calk MD 09/13/2024 07:00 AM EDT RP Workstation: [id number]   CT Cervical Spine Wo Contrast Result Date: 09/13/2024 CLINICAL DATA:  68 year old female status post fall last night at 2030 hours, right side pain, struck head, positive loss of consciousness. EXAM: CT CERVICAL SPINE WITHOUT CONTRAST TECHNIQUE: Multidetector CT imaging of the cervical spine was performed without intravenous contrast. Multiplanar CT image reconstructions were also generated. RADIATION DOSE REDUCTION: This exam was performed according to the departmental dose-optimization program which includes  automated exposure control, adjustment of the mA and/or kV according to patient size and/or use of iterative reconstruction technique. COMPARISON:  Head CT today.  Cervical spine CT 08/22/2020. FINDINGS: Alignment: Stable straightening of cervical lordosis and chronic degenerative anterolisthesis of C4 on C5. Cervicothoracic junction alignment is within normal  limits. Bilateral posterior element alignment is within normal limits. Skull base and vertebrae: Visualized skull base is intact. No atlanto-occipital dissociation. Congenital incomplete ossification of the posterior C1 ring, normal variant. Intermittent mild motion artifact. C1 and C2 appear intact and aligned. No acute osseous abnormality identified. Soft tissues and spinal canal: No prevertebral fluid or swelling. No visible canal hematoma. Retropharyngeal cervical carotids. Motion artifact in the bilateral neck. Disc levels: Stable chronic cervical spine degeneration, predominantly bilateral facet arthropathy. Chronic degenerative anterolisthesis of C4 on C5. Moderate to advanced chronic disc and endplate degeneration at C5-C6. 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 Motion artifact. Chest CT today reported separately. IMPRESSION: Mild motion artifact. No acute traumatic injury identified in the cervical spine. Electronically Signed   By: VEAR Hurst M.D.   On: 09/13/2024 06:50   CT Head Wo Contrast Result Date: 09/13/2024 CLINICAL DATA:  68 year old female status post fall last night at 2030 hours, right side pain, struck head, positive loss of consciousness. EXAM: CT HEAD WITHOUT CONTRAST TECHNIQUE: Contiguous axial images were obtained from the base of the skull through the vertex without intravenous contrast. RADIATION DOSE REDUCTION: This exam was performed according to the departmental dose-optimization program which includes automated exposure control, adjustment of the mA and/or kV according to  patient size and/or use of iterative reconstruction technique. COMPARISON:  Head CT 08/22/2020. FINDINGS: Brain: Cerebral volume is stable, within normal limits for age. No midline shift, mass effect, or evidence of intracranial mass lesion. No ventriculomegaly. No acute intracranial hemorrhage identified. Chronic basal ganglia vascular calcifications. Patchy hypodensity scattered in the bilateral cerebral white matter and bilateral basal ganglia is stable, moderate for age. No cortically based acute infarct identified. Vascular: No suspicious intracranial vascular hyperdensity. Advanced Calcified atherosclerosis at the skull base. Skull: Stable and intact. Congenital incomplete ossification of the posterior C1 ring, normal variant. Sinuses/Orbits: Similar low-density fluid and/or mucosal thickening in the right paranasal sinuses, regressed in the right sphenoid sinus rim last month. Tympanic cavities and mastoids well aerated. Other: Asymmetric mild forehead soft tissue swelling and stranding now greater on the right (series 3, image 32). Underlying frontal bones and frontal sinuses appear intact. No scalp soft tissue gas. Stable orbits soft tissues. IMPRESSION: 1. Mild right forehead soft tissue injury. No skull fracture. 2. No acute intracranial abnormality. Stable non contrast CT appearance of chronic small vessel disease. Electronically Signed   By: VEAR Hurst M.D.   On: 09/13/2024 06:47   @IMAGES @  Assessment: Principal Problem:   Rib fractures Active Problems:   Chronic pain syndrome   Chronic respiratory failure with hypoxia (HCC)   Epilepsy (HCC)   Obstructive sleep apnea on CPAP   COPD (chronic obstructive pulmonary disease) (HCC)   Sarcoidosis   Chronic musculoskeletal pain   Bipolar affective disorder, current episode manic (HCC)   Essential hypertension   Dementia, presenile with depression (HCC)   Acute on chronic anemia  68 year old female with a history of dementia, epilepsy and  chronic musculoskeletal pain admitted for fall with resultant rib fractures.  Patient is clinically stable.  No overt gastrointestinal bleeding episodes noted.  She has a remote history of peptic ulcer disease.  Anemia present with a hemoglobin lower than her usual baseline.  Hemodynamically stable.  Recommendations:  At this juncture, I recommend only supportive care and following a daily hematocrit.  No endoscopies are planned given patient's hemodynamic stability.  May consider continuing acid suppression while patient is in the hospital to treat any  potential peptic ulcer issues for which she has a history. GI will sign off at this time.  Call us  back if needed.  Thank you for the consult. Please call with questions or concerns.  Aundria Ladell Eck MD [facility] [address] [place], [place] [address] [phone]  09/13/2024 1:20 PM

## 2024-09-13 NOTE — ED Triage Notes (Addendum)
 BIB family POV after patient fell last night around 2030 and is now C/O right rib pain and pain under right breast that hurts worse with movement or deep breaths. + head strike, + LOC. Not on blood thinners.

## 2024-09-13 NOTE — ED Provider Notes (Signed)
 [facility] Provider Note    Event Date/Time   First MD Initiated Contact with Patient 09/13/24 [phone]     (approximate)   History   Fall   HPI  Jean Gomez is a 68 y.o. female with history of chronic pain syndrome, COPD on 3 L nasal cannula at baseline, hypertension, hyperlipidemia, presenting with sided pain after fall.  Patient states that she was running into her car, slipped on the wet ground and fell against concrete.  Positive LOC, is complaining about right sided head pain, right sided chest, flank and right lower quadrant pain as well as right knee pain.  Denies being on any blood thinning medication.  On independent chart review, she had an echo done in 2024 that showed EF of 65 to 70%.  Not see any anticoagulation on her med list.     Physical Exam   Triage Vital Signs: ED Triage Vitals  Encounter Vitals Group     BP 09/13/24 0545 104/62     Girls Systolic BP Percentile --      Girls Diastolic BP Percentile --      Boys Systolic BP Percentile --      Boys Diastolic BP Percentile --      Pulse Rate 09/13/24 0545 (!) 135     Resp 09/13/24 0545 18     Temp 09/13/24 0545 98.8 F (37.1 C)     Temp Source 09/13/24 0545 Oral     SpO2 09/13/24 0545 100 %     Weight --      Height --      Head Circumference --      Peak Flow --      Pain Score 09/13/24 0547 10     Pain Loc --      Pain Education --      Exclude from Growth Chart --     Most recent vital signs: Vitals:   09/13/24 0651 09/13/24 0700  BP: 112/79 117/74  Pulse:  (!) 117  Resp: (!) 28 (!) 27  Temp:    SpO2:  98%     General: Awake, no distress.  CV:  Good peripheral perfusion.  Resp:  Normal effort.  Right lateral and bilateral anterior thoracic cage tender on palpation, lungs are clear, no wheezing Abd:  No distention.  Soft, mildly tender to the right lower quadrant Other:  She has right flank tenderness, right knee tenderness, able to fully range all 4  extremities without focal weakness or numbness, palpable radial and DP pulses.  No midline spinal tenderness.  She has tenderness to the right side of her head, no facial deformities or tenderness.   ED Results / Procedures / Treatments   Labs (all labs ordered are listed, but only abnormal results are displayed) Labs Reviewed  CBC WITH DIFFERENTIAL/PLATELET - Abnormal; Notable for the following components:      Result Value   WBC 17.6 (*)    RBC 3.38 (*)    Hemoglobin 7.6 (*)    HCT 25.4 (*)    MCV 75.1 (*)    MCH 22.5 (*)    MCHC 29.9 (*)    RDW 20.8 (*)    Platelets 608 (*)    Neutro Abs 13.9 (*)    Monocytes Absolute 1.3 (*)    Abs Immature Granulocytes 0.23 (*)    All other components within normal limits  COMPREHENSIVE METABOLIC PANEL WITH GFR  TROPONIN I (HIGH SENSITIVITY)  EKG  EKG shows, sinus tachycardia, rate 134, normal QRS, QTc is prolonged, no obvious ischemic ST elevation, T wave flattening in aVL, this is changed compared to prior   RADIOLOGY On my independent interpretation, CT head without obvious intracranial hemorrhage   PROCEDURES:  Critical Care performed: No  Procedures   MEDICATIONS ORDERED IN ED: Medications  lidocaine  (LIDODERM ) 5 % 1 patch (has no administration in time range)  sodium chloride  0.9 % bolus 500 mL (500 mLs Intravenous New Bag/Given 09/13/24 0648)  morphine  (PF) 2 MG/ML injection 2 mg (2 mg Intravenous Given 09/13/24 0646)     IMPRESSION / MDM / ASSESSMENT AND PLAN / ED COURSE  I reviewed the triage vital signs and the nursing notes.                              Differential diagnosis includes, but is not limited to, closed head injury, intracranial hemorrhage, fracture, sprain, strain, contusion.  Given headache, tenderness along the right side, will plan to pan scan her, will also x-ray her right knee.  Labs, troponin, EKG, will give her some IV fluids, give her some IV pain meds.  Lidoderm  patch.  Patient's  presentation is most consistent with acute presentation with potential threat to life or bodily function.  Independent interpretation of labs and imaging below.  CT imaging shows multiple rib fractures, she will need to be admitted for further management.  Labs are still pending.  Patient signed out to oncoming team pending labs and admission.  The patient is on the cardiac monitor to evaluate for evidence of arrhythmia and/or significant heart rate changes.   Clinical Course as of 09/13/24 0713  Sat Sep 13, 2024  9342 CT Cervical Spine Wo Contrast IMPRESSION: Mild motion artifact. No acute traumatic injury identified in the cervical spine.   [TT]  0657 CT Head Wo Contrast IMPRESSION: 1. Mild right forehead soft tissue injury. No skull fracture. 2. No acute intracranial abnormality. Stable non contrast CT appearance of chronic small vessel disease.   [TT]  0703 History of chronic pain, nonsyncopal fall today with pain to the R side.  No on anticoag.  CT head, c spine neg. CTA CAP wo con w mult rib fxs bilaterally.  Labs pending. Will need admission for rib fx and pain control.  [SM]    Clinical Course User Index [SM] Suzanne Kirsch, MD [TT] Waymond Lorelle Cummins, MD     FINAL CLINICAL IMPRESSION(S) / ED DIAGNOSES   Final diagnoses:  Prolonged QT interval  Fall, initial encounter  Minor head injury, initial encounter  Closed fracture of multiple ribs of right side, initial encounter  Flank pain  RLQ abdominal pain  Acute pain of right knee     Rx / DC Orders   ED Discharge Orders     None        Note:  This document was prepared using Dragon voice recognition software and may include unintentional dictation errors.    Waymond Lorelle Cummins, MD 09/13/24 [phone]

## 2024-09-13 NOTE — ED Notes (Signed)
 Pt sitting up eating lunch, NAD at this time.

## 2024-09-13 NOTE — Progress Notes (Signed)
   09/13/24 1730  Vitals  Temp 99.7 F (37.6 C)  BP 133/79  MAP (mmHg) 95  BP Location Left Arm  BP Method Automatic  Patient Position (if appropriate) Sitting  Pulse Rate (!) 117  Pulse Rate Source Monitor  Resp (!) 22  Level of Consciousness  Level of Consciousness Alert  Oxygen  Therapy  SpO2 96 %   Patient admitted from ED alert on 2LNC; yellow MEWS for HR and RR. Provider notified via secure chat.  Pain intervention given; plan of care continues.

## 2024-09-14 DIAGNOSIS — S2243XA Multiple fractures of ribs, bilateral, initial encounter for closed fracture: Secondary | ICD-10-CM | POA: Diagnosis not present

## 2024-09-14 LAB — CBC
HCT: 22.7 % — ABNORMAL LOW (ref 36.0–46.0)
Hemoglobin: 6.5 g/dL — ABNORMAL LOW (ref 12.0–15.0)
MCH: 22.7 pg — ABNORMAL LOW (ref 26.0–34.0)
MCHC: 28.6 g/dL — ABNORMAL LOW (ref 30.0–36.0)
MCV: 79.4 fL — ABNORMAL LOW (ref 80.0–100.0)
Platelets: 294 K/uL (ref 150–400)
RBC: 2.86 MIL/uL — ABNORMAL LOW (ref 3.87–5.11)
RDW: 21.2 % — ABNORMAL HIGH (ref 11.5–15.5)
WBC: 10.3 K/uL (ref 4.0–10.5)
nRBC: 0 % (ref 0.0–0.2)

## 2024-09-14 LAB — BASIC METABOLIC PANEL WITH GFR
Anion gap: 10 (ref 5–15)
BUN: 13 mg/dL (ref 8–23)
CO2: 22 mmol/L (ref 22–32)
Calcium: 8.5 mg/dL — ABNORMAL LOW (ref 8.9–10.3)
Chloride: 107 mmol/L (ref 98–111)
Creatinine, Ser: 0.81 mg/dL (ref 0.44–1.00)
GFR, Estimated: >60 mL/min (ref >60)
Glucose, Bld: 92 mg/dL (ref 70–99)
Potassium: 4.5 mmol/L (ref 3.5–5.1)
Sodium: 139 mmol/L (ref 135–145)

## 2024-09-14 LAB — HEMOGLOBIN: Hemoglobin: 7 g/dL — ABNORMAL LOW (ref 12.0–15.0)

## 2024-09-14 MED ORDER — POLYETHYLENE GLYCOL 3350 17 G PO PACK
17.0000 g | PACK | Freq: Every day | ORAL | Status: DC
  Administered 2024-09-14 – 2024-09-23 (×8): 17 g via ORAL
  Filled 2024-09-14 (×10): qty 1

## 2024-09-14 MED ORDER — SENNA 8.6 MG PO TABS
1.0000 | ORAL_TABLET | Freq: Every day | ORAL | Status: DC
Start: 2024-09-14 — End: 2024-09-18
  Administered 2024-09-14 – 2024-09-18 (×5): 8.6 mg via ORAL
  Filled 2024-09-14 (×5): qty 1

## 2024-09-14 NOTE — Progress Notes (Signed)
 PROGRESS NOTE    Jean Gomez  FMW:[id number] DOB: 1956-08-13 DOA: 09/13/2024 PCP: Pcp, No     Brief Narrative:   From admission h and p  Jean Gomez is a 68 y.o. female with medical history significant for copd, on o2, seizures, chronic pain, polypharmacy, presents after fall.   [place] when lost her grip and fell striking right side of chest and right side of head. No laceration, no LOC. Otherwise feeling at her baseline, no fevers or nausea/vomiting. Reports dark Jeffries stool, does appear to be taking asa and indocin .      Assessment & Plan:   Principal Problem:   Rib fractures Active Problems:   Chronic pain syndrome   Chronic musculoskeletal pain   COPD (chronic obstructive pulmonary disease) (HCC)   Chronic respiratory failure with hypoxia (HCC)   Epilepsy (HCC)   Obstructive sleep apnea on CPAP   Sarcoidosis   Bipolar affective disorder, current episode manic (HCC)   Essential hypertension   Dementia, presenile with depression (HCC)   Acute on chronic anemia  # Rib fractures Right 3-7 and left 2nd after fall, no complications thus far. No other apparent significant injury, ct head and neck, right knee all neg - pain control - IS - PT eval   # Acute on chronic microcytic anemia Ulcer disease seen on egd last year, hgb 7s from baseline 9-10. Appears taking asa and indocin  at home. GI has seen, declines eval for now though would re-consider if dropping hgb or signs overt bleeding. Hgb is in 6s today but other cell lines are also significantly dilutional from running IVFs, will re-peat hgb - clears - ppi bid - repeat hgb, consider advancing diet if improved   # Hypokalemia # Hypomagnesemia Resolved - monitor   # AKI Mild, 1.31 from baseline of normal. Resolved with fluids - monitor off fluids   # COPD # Chronic hypoxic respiratory failure No exacerbation, stable on home 3 liters - continue home o2 and home inhalers   #  Leukocytosis No signs/symptoms of infection otherwise. Possibly reactive - monitor   # Chronic pain - cont home oxy, hold home indocin  - morphine  prn for rib fracture   # Dementia - hold home aricept  given qtc prolongation   # prolonged qtc - will hold aricept  and mirtazapine  for now   # MDD/gad - hold home mirtaz as above - cont home celexa , buspar , xanax    # Insomnia - home belsomra    # Chronic constipation - home linzess    # Seizure disorder - home keppra    # HTN Bp low normal - home meds on hold   DVT prophylaxis: SCDs for now Code Status: dnr Family Communication: daughter tammi 9/28  Level of care: Med-Surg Status is: Observation    Consultants:  GI (signed off)  Procedures: none  Antimicrobials:  none    Subjective: Reports slight improvement in right rib pain  Objective: Vitals:   09/13/24 2052 09/14/24 0518 09/14/24 0813 09/14/24 0857  BP: 119/72 99/68 105/71   Pulse: (!) 110 91 (!) 130 (!) 103  Resp: 18 18 18    Temp: 98.1 F (36.7 C) 97.9 F (36.6 C) 98.4 F (36.9 C)   TempSrc:      SpO2: 97% 93% 97% 100%  Weight:       No intake or output data in the 24 hours ending 09/14/24 1045 Filed Weights   09/13/24 0651  Weight: 80.3 kg    Examination:  General exam: Appears calm and comfortable  Respiratory system: Clear to auscultation. Decreased effort Cardiovascular system: S1 & S2 heard, RRR.   Gastrointestinal system: Abdomen is mildly distended, nontender Central nervous system: Alert and oriented. No focal neurological deficits. Extremities: Symmetric 5 x 5 power. Skin: No rashes, lesions or ulcers Psychiatry: Judgement and insight appear normal. Mood & affect appropriate.     Data Reviewed: I have personally reviewed following labs and imaging studies  CBC: Recent Labs  Lab 09/13/24 0615 09/14/24 0700  WBC 17.6* 10.3  NEUTROABS 13.9*  --   HGB 7.6* 6.5*  HCT 25.4* 22.7*  MCV 75.1* 79.4*  PLT 608* 294   Basic  Metabolic Panel: Recent Labs  Lab 09/13/24 0615 09/14/24 0700  NA 139 139  K 3.0* 4.5  CL 100 107  CO2 23 22  GLUCOSE 181* 92  BUN 21 13  CREATININE 1.31* 0.81  CALCIUM  8.6* 8.5*  MG 1.5*  --    GFR: Estimated Creatinine Clearance: 66.7 mL/min (by C-G formula based on SCr of 0.81 mg/dL). Liver Function Tests: Recent Labs  Lab 09/13/24 0615  AST 31  ALT 14  ALKPHOS 66  BILITOT 0.4  PROT 6.9  ALBUMIN  2.6*   No results for input(s): LIPASE, AMYLASE in the last 168 hours. No results for input(s): AMMONIA in the last 168 hours. Coagulation Profile: No results for input(s): INR, PROTIME in the last 168 hours. Cardiac Enzymes: No results for input(s): CKTOTAL, CKMB, CKMBINDEX, TROPONINI in the last 168 hours. BNP (last 3 results) No results for input(s): PROBNP in the last 8760 hours. HbA1C: No results for input(s): HGBA1C in the last 72 hours. CBG: No results for input(s): GLUCAP in the last 168 hours. Lipid Profile: No results for input(s): CHOL, HDL, LDLCALC, TRIG, CHOLHDL, LDLDIRECT in the last 72 hours. Thyroid  Function Tests: No results for input(s): TSH, T4TOTAL, FREET4, T3FREE, THYROIDAB in the last 72 hours. Anemia Panel: Recent Labs    09/13/24 0615  FERRITIN 10*   Urine analysis:    Component Value Date/Time   COLORURINE YELLOW (A) 10/20/2023 2005   APPEARANCEUR CLOUDY (A) 10/20/2023 2005   LABSPEC 1.006 10/20/2023 2005   PHURINE 6.0 10/20/2023 2005   GLUCOSEU 50 (A) 10/20/2023 2005   HGBUR SMALL (A) 10/20/2023 2005   BILIRUBINUR negative 07/28/2024 1414   KETONESUR NEGATIVE 10/20/2023 2005   PROTEINUR Negative 07/28/2024 1414   PROTEINUR 30 (A) 10/20/2023 2005   UROBILINOGEN 0.2 07/28/2024 1414   NITRITE negative 07/28/2024 1414   NITRITE NEGATIVE 10/20/2023 2005   LEUKOCYTESUR Trace (A) 07/28/2024 1414   LEUKOCYTESUR LARGE (A) 10/20/2023 2005   Sepsis  Labs: @LABRCNTIP (procalcitonin:4,lacticidven:4)  )No results found for this or any previous visit (from the past 240 hours).       Radiology Studies: DG Knee Complete 4 Views Right Result Date: 09/13/2024 EXAM: 4 VIEW(S) XRAY OF THE RIGHT KNEE 09/13/2024 07:27:00 AM COMPARISON: None available. CLINICAL HISTORY: fall. Right knee pain after a fall while trying to get into car 1 day ago FINDINGS: BONES AND JOINTS: No acute fracture. No focal osseous lesion. No joint dislocation. Small bursal effusion. Marginal spurring with mild joint space loss. 1.9 x 1.7 x 1.7 cm well corticated intra-articular body posteriorly in the joint space. SOFT TISSUES: Femoral arterial calcifications. IMPRESSION: 1. Small bursal effusion. 2. Well-corticated 1.9 cm intra-articular body in the posterior joint space. 3. No acute fracture or dislocation. Electronically signed by: Waddell Calk MD 09/13/2024 07:31 AM EDT RP Workstation: [id number]  CT CHEST ABDOMEN PELVIS WO CONTRAST Result Date: 09/13/2024 EXAM: CT CHEST, ABDOMEN AND PELVIS WITHOUT CONTRAST 09/13/2024 06:40:18 AM TECHNIQUE: CT of the chest, abdomen and pelvis was performed without the administration of intravenous contrast. Multiplanar reformatted images are provided for review. Automated exposure control, iterative reconstruction, and/or weight based adjustment of the mA/kV was utilized to reduce the radiation dose to as low as reasonably achievable. COMPARISON: CT abdomen and pelvis 10/20/2023. CLINICAL HISTORY: Polytrauma, blunt. Table formatting from the original note was not included.; Images from the original note were not included.; Per ed notes; BIB family POV after patient fell last night around 2030 and is now C/O right rib pain and pain under right breast that hurts worse with movement or deep breaths. + head strike, + LOC. Not on blood thinners FINDINGS: CHEST: MEDIASTINUM AND LYMPH NODES: Normal heart size. Increased caliber of the main pulmonary  artery measures 3.5 cm. Aortic atherosclerosis. Coronary artery calcifications. The central airways are clear. No mediastinal, hilar or axillary lymphadenopathy. LUNGS AND PLEURA: Bilateral areas of bronchiectasis, scarring and volume loss identified. Scar versus subsegmental atelectasis in the right lung base. No focal consolidation or pulmonary edema. No pneumothorax, pleural effusions or signs of pulmonary contusion. ABDOMEN AND PELVIS: LIVER: The liver is unremarkable. GALLBLADDER AND BILE DUCTS: Status post cholecystectomy. Caliber of the common bile duct measures 8 mm. This is similar to the previous exam. In the absence of signs or symptoms of biliary obstruction this is favored to represent post cholecystectomy physiology. 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 Spleen appears normal. PANCREAS: No acute abnormality. ADRENAL GLANDS: No acute abnormality. KIDNEYS, URETERS AND BLADDER: No stones in the kidneys or ureters. No hydronephrosis. No perinephric or periureteral stranding. Urinary bladder is unremarkable. GI AND BOWEL: Stomach demonstrates no acute abnormality. No dilated loops of large or small bowel. There is no pathologic dilatation of the bowel. Moderate stool burden is identified within the cecum and ascending colon. There is no bowel obstruction. REPRODUCTIVE ORGANS: The uterus is surgically absent. No adnexal mass. PERITONEUM AND RETROPERITONEUM: No free fluid or fluid collections. No free air. VASCULATURE: Aorta is normal in caliber. Aortic atherosclerosis. ABDOMINAL AND PELVIS LYMPH NODES: No lymphadenopathy. BONES AND SOFT TISSUES: Multiple, acute right anterior lateral rib fractures including the right third, fourth, fifth, sixth, and seventh ribs. There is also a fracture involving the anterior aspect of the left second rib. Mild age-indeterminate superior endplate deformity involves the T4 vertebra without signs of retropulsion of fracture fragments, image 59 of  the sagittal series. Lumbar vertebral body heights are all well maintained. First-degree anterolisthesis is identified at L5 on S1. No focal soft tissue abnormality. IMPRESSION: 1. Multiple acute right anterolateral rib fractures involving the right third through seventh ribs, and an acute fracture of the anterior left second rib. 2. Mild age-indeterminate compression deformity involving the T4 vertebra. 3. No pneumothorax, pleural effusion, or pulmonary contusion detected. 4. No signs of solid organ injury within the abdomen and pelvis. No free fluid noted. Electronically signed by: Waddell Calk MD 09/13/2024 07:00 AM EDT RP Workstation: [id number]   CT Cervical Spine Wo Contrast Result Date: 09/13/2024 CLINICAL DATA:  68 year old female status post fall last night at 2030 hours, right side pain, struck head, positive loss of consciousness. EXAM: CT CERVICAL SPINE WITHOUT CONTRAST TECHNIQUE: Multidetector CT imaging of the cervical spine was performed without intravenous contrast. Multiplanar CT image reconstructions were also generated. RADIATION DOSE REDUCTION: This exam was performed according to the departmental dose-optimization  program which includes automated exposure control, adjustment of the mA and/or kV according to patient size and/or use of iterative reconstruction technique. COMPARISON:  Head CT today.  Cervical spine CT 08/22/2020. FINDINGS: Alignment: Stable straightening of cervical lordosis and chronic degenerative anterolisthesis of C4 on C5. Cervicothoracic junction alignment is within normal limits. Bilateral posterior element alignment is within normal limits. Skull base and vertebrae: Visualized skull base is intact. No atlanto-occipital dissociation. Congenital incomplete ossification of the posterior C1 ring, normal variant. Intermittent mild motion artifact. C1 and C2 appear intact and aligned. No acute osseous abnormality identified. Soft tissues and spinal canal: No prevertebral  fluid or swelling. No visible canal hematoma. Retropharyngeal cervical carotids. Motion artifact in the bilateral neck. Disc levels: Stable chronic cervical spine degeneration, predominantly bilateral facet arthropathy. Chronic degenerative anterolisthesis of C4 on C5. Moderate to advanced chronic disc and endplate degeneration at C5-C6. 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 Motion artifact. Chest CT today reported separately. IMPRESSION: Mild motion artifact. No acute traumatic injury identified in the cervical spine. Electronically Signed   By: VEAR Hurst M.D.   On: 09/13/2024 06:50   CT Head Wo Contrast Result Date: 09/13/2024 CLINICAL DATA:  68 year old female status post fall last night at 2030 hours, right side pain, struck head, positive loss of consciousness. EXAM: CT HEAD WITHOUT CONTRAST TECHNIQUE: Contiguous axial images were obtained from the base of the skull through the vertex without intravenous contrast. RADIATION DOSE REDUCTION: This exam was performed according to the departmental dose-optimization program which includes automated exposure control, adjustment of the mA and/or kV according to patient size and/or use of iterative reconstruction technique. COMPARISON:  Head CT 08/22/2020. FINDINGS: Brain: Cerebral volume is stable, within normal limits for age. No midline shift, mass effect, or evidence of intracranial mass lesion. No ventriculomegaly. No acute intracranial hemorrhage identified. Chronic basal ganglia vascular calcifications. Patchy hypodensity scattered in the bilateral cerebral white matter and bilateral basal ganglia is stable, moderate for age. No cortically based acute infarct identified. Vascular: No suspicious intracranial vascular hyperdensity. Advanced Calcified atherosclerosis at the skull base. Skull: Stable and intact. Congenital incomplete ossification of the posterior C1 ring, normal variant. Sinuses/Orbits: Similar  low-density fluid and/or mucosal thickening in the right paranasal sinuses, regressed in the right sphenoid sinus rim last month. Tympanic cavities and mastoids well aerated. Other: Asymmetric mild forehead soft tissue swelling and stranding now greater on the right (series 3, image 32). Underlying frontal bones and frontal sinuses appear intact. No scalp soft tissue gas. Stable orbits soft tissues. IMPRESSION: 1. Mild right forehead soft tissue injury. No skull fracture. 2. No acute intracranial abnormality. Stable non contrast CT appearance of chronic small vessel disease. Electronically Signed   By: VEAR Hurst M.D.   On: 09/13/2024 06:47        Scheduled Meds:  atorvastatin   80 mg Oral QHS   budesonide   0.25 mg Nebulization BID   busPIRone   15 mg Oral BID   citalopram   40 mg Oral q morning   levETIRAcetam   750 mg Oral q morning   linaclotide   290 mcg Oral QAC breakfast   pantoprazole  (PROTONIX ) IV  40 mg Intravenous Q12H   polyethylene glycol  17 g Oral Daily   senna  1 tablet Oral Daily   Suvorexant   1 tablet Oral QHS   Continuous Infusions:   LOS: 0 days     Devaughn KATHEE Ban, MD [facility]   If 7PM-7AM, please contact night-coverage [email or web address] Password  [email or web address] 09/14/2024, 10:45 AM

## 2024-09-14 NOTE — Plan of Care (Signed)

## 2024-09-14 NOTE — Evaluation (Signed)
 Physical Therapy Evaluation Patient Details Name: Jean Gomez MRN: [id number] DOB: [birth year] Today's Date: 09/14/2024  History of Present Illness  Jean Gomez is a 68 y.o. female with medical history significant for copd, on o2, seizures, chronic pain, polypharmacy, presents after fall.  Clinical Impression  Patient seen for initial PT evaluation due to decline in functional status. Patient is A&O x 4. Baseline mobility reported as ModI, currently requiring minA for bed mobility to sit EOB. Vc provided for hand placement, movement sequence and weight shift. Pt able to sit EOB for BP and spO2 reading BP WNL and spO2 90% on 3L O2 with heavy breathing after mobility; Pt fatigued O2 reading return to 100%. Gait assessed with RW requiring MinA. Pt able to take few steps forward and backwards while transitioning into chair for a total of 8 steps, limited by fatigue. Pt lives with daughter and has occasional support when needed. Pt has ramped entrance with 2 small steps to enter at daughter house. Clinical impression: patient presents with moderate mobility limitations secondary to fall and rib fracture. Recommend acute level skilled PT to address safety, mobility, and discharge planning. PT recommend d/c to SNF at this time if pt mobility continues to improve pt. Safe to d/c home.       If plan is discharge home, recommend the following: A lot of help with walking and/or transfers;A lot of help with bathing/dressing/bathroom   Can travel by private vehicle        Equipment Recommendations    Recommendations for Other Services       Functional Status Assessment Patient has had a recent decline in their functional status and demonstrates the ability to make significant improvements in function in a reasonable and predictable amount of time.     Precautions / Restrictions Precautions Precautions: Fall Restrictions Weight Bearing Restrictions Per Provider Order: No      Mobility   Bed Mobility Overal bed mobility: Needs Assistance Bed Mobility: Rolling, Sidelying to Sit Rolling: Min assist Sidelying to sit: Min assist       General bed mobility comments: move slowly but egar to move; required vc and manual assistance to sit EOB    Transfers Overall transfer level: Needs assistance Equipment used: Rolling walker (2 wheels) Transfers: Sit to/from Stand Sit to Stand: Min assist, Mod assist           General transfer comment: elevated bed height ~6 in. to manage pain    Ambulation/Gait Ambulation/Gait assistance: Min assist Gait Distance (Feet): 8 Feet Assistive device: Rolling walker (2 wheels) Gait Pattern/deviations: Decreased step length - right, Decreased step length - left, Step-to pattern       General Gait Details: slow to move  Stairs            Wheelchair Mobility     Tilt Bed    Modified Rankin (Stroke Patients Only)       Balance Overall balance assessment: Needs assistance Sitting-balance support: No upper extremity supported Sitting balance-Leahy Scale: Normal Sitting balance - Comments: able to sit ~ 2 min. without support   Standing balance support: Bilateral upper extremity supported Standing balance-Leahy Scale: Fair Standing balance comment: slightly unsteady initially with standing                             Pertinent Vitals/Pain Pain Assessment Pain Assessment: 0-10 Pain Score: 8  Pain Location: R side rib area Pain Descriptors / Indicators: Aching  Pain Intervention(s): Monitored during session, Repositioned    Home Living Family/patient expects to be discharged to:: Private residence Living Arrangements: Children Available Help at Discharge: Family;Available PRN/intermittently Type of Home: Apartment Home Access: Level entry       Home Layout: One level Home Equipment: Cane - single point;Shower seat;Rollator (4 wheels);Rolling Walker (2 wheels)      Prior Function Prior Level of  Function : Independent/Modified Independent;Driving             Mobility Comments: use walker around the home ADLs Comments: She is Ind in self care tasks and most IADLs and still drives. Daughter and her live togther.     Extremity/Trunk Assessment        Lower Extremity Assessment Lower Extremity Assessment: Generalized weakness       Communication   Communication Communication: No apparent difficulties    Cognition Arousal: Lethargic, Alert Behavior During Therapy: WFL for tasks assessed/performed   PT - Cognitive impairments: No apparent impairments                       PT - Cognition Comments: slow to respond due to pain meds Following commands: Intact       Cueing Cueing Techniques: Verbal cues     General Comments      Exercises     Assessment/Plan    PT Assessment Patient needs continued PT services  PT Problem List Decreased strength;Decreased range of motion;Decreased activity tolerance;Decreased balance;Decreased mobility       PT Treatment Interventions DME instruction;Gait training;Functional mobility training;Therapeutic activities;Therapeutic exercise;Neuromuscular re-education;Patient/family education    PT Goals (Current goals can be found in the Care Plan section)  Acute Rehab PT Goals Patient Stated Goal: Pt wants to get stronger. Pt open to going to SNF if needed PT Goal Formulation: With patient Time For Goal Achievement: 09/29/24 Potential to Achieve Goals: Fair    Frequency Min 2X/week     Co-evaluation               AM-PAC PT 6 Clicks Mobility  Outcome Measure Help needed turning from your back to your side while in a flat bed without using bedrails?: A Little Help needed moving from lying on your back to sitting on the side of a flat bed without using bedrails?: A Little Help needed moving to and from a bed to a chair (including a wheelchair)?: A Little Help needed standing up from a chair using your  arms (e.g., wheelchair or bedside chair)?: A Little Help needed to walk in hospital room?: A Lot Help needed climbing 3-5 steps with a railing? : A Lot 6 Click Score: 16    End of Session Equipment Utilized During Treatment:  (no gait belt used due to rib pain; assistance provided under arm hand positioning) Activity Tolerance: Patient tolerated treatment well;Patient limited by fatigue (pt verbalized being fatigue after transition to chair) Patient left: in chair;with chair alarm set Nurse Communication: Mobility status PT Visit Diagnosis: Unsteadiness on feet (R26.81);Other abnormalities of gait and mobility (R26.89);Difficulty in walking, not elsewhere classified (R26.2);Muscle weakness (generalized) (M62.81)    Time: 8660-8588 PT Time Calculation (min) (ACUTE ONLY): 32 min   Charges:   PT Evaluation $PT Eval Low Complexity: 1 Low   PT General Charges $$ ACUTE PT VISIT: 1 Visit         Sherlean Lesches DPT, PT    Yoko Mcgahee A Shandy Checo 09/14/2024, 2:26 PM

## 2024-09-15 DIAGNOSIS — G4700 Insomnia, unspecified: Secondary | ICD-10-CM | POA: Diagnosis present

## 2024-09-15 DIAGNOSIS — F0394 Unspecified dementia, unspecified severity, with anxiety: Secondary | ICD-10-CM | POA: Diagnosis present

## 2024-09-15 DIAGNOSIS — N179 Acute kidney failure, unspecified: Secondary | ICD-10-CM | POA: Diagnosis present

## 2024-09-15 DIAGNOSIS — J4489 Other specified chronic obstructive pulmonary disease: Secondary | ICD-10-CM | POA: Diagnosis present

## 2024-09-15 DIAGNOSIS — Z880 Allergy status to penicillin: Secondary | ICD-10-CM | POA: Diagnosis not present

## 2024-09-15 DIAGNOSIS — E876 Hypokalemia: Secondary | ICD-10-CM | POA: Diagnosis present

## 2024-09-15 DIAGNOSIS — Z9981 Dependence on supplemental oxygen: Secondary | ICD-10-CM | POA: Diagnosis not present

## 2024-09-15 DIAGNOSIS — W109XXA Fall (on) (from) unspecified stairs and steps, initial encounter: Secondary | ICD-10-CM | POA: Diagnosis present

## 2024-09-15 DIAGNOSIS — Z66 Do not resuscitate: Secondary | ICD-10-CM | POA: Diagnosis present

## 2024-09-15 DIAGNOSIS — I10 Essential (primary) hypertension: Secondary | ICD-10-CM | POA: Diagnosis not present

## 2024-09-15 DIAGNOSIS — Z7982 Long term (current) use of aspirin: Secondary | ICD-10-CM | POA: Diagnosis not present

## 2024-09-15 DIAGNOSIS — G40909 Epilepsy, unspecified, not intractable, without status epilepticus: Secondary | ICD-10-CM | POA: Diagnosis present

## 2024-09-15 DIAGNOSIS — F0393 Unspecified dementia, unspecified severity, with mood disturbance: Secondary | ICD-10-CM | POA: Diagnosis present

## 2024-09-15 DIAGNOSIS — S2243XA Multiple fractures of ribs, bilateral, initial encounter for closed fracture: Secondary | ICD-10-CM | POA: Diagnosis not present

## 2024-09-15 DIAGNOSIS — J449 Chronic obstructive pulmonary disease, unspecified: Secondary | ICD-10-CM | POA: Diagnosis not present

## 2024-09-15 DIAGNOSIS — G894 Chronic pain syndrome: Secondary | ICD-10-CM | POA: Diagnosis not present

## 2024-09-15 DIAGNOSIS — Y9301 Activity, walking, marching and hiking: Secondary | ICD-10-CM | POA: Diagnosis present

## 2024-09-15 DIAGNOSIS — G4733 Obstructive sleep apnea (adult) (pediatric): Secondary | ICD-10-CM | POA: Diagnosis present

## 2024-09-15 DIAGNOSIS — D75838 Other thrombocytosis: Secondary | ICD-10-CM | POA: Diagnosis not present

## 2024-09-15 DIAGNOSIS — S0990XA Unspecified injury of head, initial encounter: Secondary | ICD-10-CM | POA: Diagnosis present

## 2024-09-15 DIAGNOSIS — F03918 Unspecified dementia, unspecified severity, with other behavioral disturbance: Secondary | ICD-10-CM | POA: Diagnosis present

## 2024-09-15 DIAGNOSIS — F325 Major depressive disorder, single episode, in full remission: Secondary | ICD-10-CM | POA: Diagnosis present

## 2024-09-15 DIAGNOSIS — F411 Generalized anxiety disorder: Secondary | ICD-10-CM | POA: Diagnosis present

## 2024-09-15 DIAGNOSIS — J9611 Chronic respiratory failure with hypoxia: Secondary | ICD-10-CM | POA: Diagnosis not present

## 2024-09-15 DIAGNOSIS — D509 Iron deficiency anemia, unspecified: Secondary | ICD-10-CM | POA: Diagnosis present

## 2024-09-15 DIAGNOSIS — D649 Anemia, unspecified: Secondary | ICD-10-CM | POA: Diagnosis not present

## 2024-09-15 DIAGNOSIS — E7800 Pure hypercholesterolemia, unspecified: Secondary | ICD-10-CM | POA: Diagnosis present

## 2024-09-15 LAB — CBC
HCT: 27.1 % — ABNORMAL LOW (ref 36.0–46.0)
Hemoglobin: 8 g/dL — ABNORMAL LOW (ref 12.0–15.0)
MCH: 22.7 pg — ABNORMAL LOW (ref 26.0–34.0)
MCHC: 29.5 g/dL — ABNORMAL LOW (ref 30.0–36.0)
MCV: 77 fL — ABNORMAL LOW (ref 80.0–100.0)
Platelets: 566 K/uL — ABNORMAL HIGH (ref 150–400)
RBC: 3.52 MIL/uL — ABNORMAL LOW (ref 3.87–5.11)
RDW: 20.9 % — ABNORMAL HIGH (ref 11.5–15.5)
WBC: 14.4 K/uL — ABNORMAL HIGH (ref 4.0–10.5)
nRBC: 0 % (ref 0.0–0.2)

## 2024-09-15 LAB — BASIC METABOLIC PANEL WITH GFR
Anion gap: 12 (ref 5–15)
BUN: 17 mg/dL (ref 8–23)
CO2: 23 mmol/L (ref 22–32)
Calcium: 9 mg/dL (ref 8.9–10.3)
Chloride: 102 mmol/L (ref 98–111)
Creatinine, Ser: 1.01 mg/dL — ABNORMAL HIGH (ref 0.44–1.00)
GFR, Estimated: >60 mL/min (ref >60)
Glucose, Bld: 118 mg/dL — ABNORMAL HIGH (ref 70–99)
Potassium: 4 mmol/L (ref 3.5–5.1)
Sodium: 137 mmol/L (ref 135–145)

## 2024-09-15 NOTE — Progress Notes (Signed)
 Mobility Specialist - Progress Note   09/15/24 1209  Mobility  Activity Pivoted/transferred to/from [facility];Ambulated with assistance  Level of Assistance Contact guard assist, steadying assist  Assistive Device Front wheel walker  Distance Ambulated (ft) 12 ft  Activity Response Tolerated well  Mobility visit 1 Mobility  Mobility Specialist Start Time (ACUTE ONLY) 1139  Mobility Specialist Stop Time (ACUTE ONLY) 1205  Mobility Specialist Time Calculation (min) (ACUTE ONLY) 26 min   Pt supine upon entry, utilizing 2L [place]-- expressed back, rib and chest pain/soreness. Pt completed bed mob MinA to bring BLE EOB w/ extra time required to bring trunk from sup to sit. Pt STS to RW MinA and transferred to/from the [facility]-- some SOB noted upon transfer EOB, VC's for PBL. Pt amb to the door within the room, requiring a seated rest break once at the door. Pt declined further amb as she received a call once seated in the recliner. Pt left seated with alarm set and needs within reach. RN notified.  America Silvan Mobility Specialist 09/15/24 12:17 PM

## 2024-09-15 NOTE — Progress Notes (Signed)
 PROGRESS NOTE    Jean Gomez  FMW:[id number] DOB: 30-May-1956 DOA: 09/13/2024 PCP: Pcp, No     Brief Narrative:   From admission h and p  Jean Gomez is a 68 y.o. female with medical history significant for copd, on o2, seizures, chronic pain, polypharmacy, presents after fall.   [place] when lost her grip and fell striking right side of chest and right side of head. No laceration, no LOC. Otherwise feeling at her baseline, no fevers or nausea/vomiting. Reports dark Cecena stool, does appear to be taking asa and indocin .      Assessment & Plan:   Principal Problem:   Rib fractures Active Problems:   Chronic pain syndrome   Chronic musculoskeletal pain   COPD (chronic obstructive pulmonary disease) (HCC)   Chronic respiratory failure with hypoxia (HCC)   Epilepsy (HCC)   Obstructive sleep apnea on CPAP   Sarcoidosis   Bipolar affective disorder, current episode manic (HCC)   Essential hypertension   Dementia, presenile with depression (HCC)   Acute on chronic anemia  # Rib fractures Right 3-7 and left 2nd after fall, no complications thus far. No other apparent significant injury, ct head and neck, right knee all neg - pain control - IS - PT advising snf, toc consulted, patient agrees   # Acute on chronic microcytic anemia Ulcer disease seen on egd last year, hgb 7s from baseline 9-10. Appears taking asa and indocin  at home. GI has seen, declines eval for now though would re-consider if dropping hgb or signs overt bleeding. Hgb stable, 8 today, no s/s bleeding - advance diet - ppi bid - monitor hgb   # Hypokalemia # Hypomagnesemia Resolved - monitor   # AKI Mild, 1.31 from baseline of normal. Resolved with fluids - monitor off fluids   # COPD # Chronic hypoxic respiratory failure No exacerbation, stable on home 3 liters - continue home o2 and home inhalers   # Leukocytosis No signs/symptoms of infection otherwise. Possibly  reactive - monitor   # Chronic pain - cont home oxy, hold home indocin  - hydromorphone  prn for rib fracture   # Dementia - hold home aricept  given qtc prolongation   # prolonged qtc - will hold aricept  and mirtazapine  for now   # MDD/gad - hold home mirtaz as above - cont home celexa , buspar , xanax    # Insomnia - home belsomra    # Chronic constipation - home linzess    # Seizure disorder - home keppra    # HTN Bp low normal - home meds on hold   DVT prophylaxis: SCDs for now Code Status: dnr/dni Family Communication: daughter Jean Gomez 9/29  Level of care: Med-Surg Status is: Observation    Consultants:  GI (signed off)  Procedures: none  Antimicrobials:  none    Subjective: Reports slight improvement in right rib pain, breathing stable no cough  Objective: Vitals:   09/15/24 0753 09/15/24 0836 09/15/24 0900 09/15/24 1059  BP: 97/62  99/68 103/70  Pulse: (!) 106 (!) 108 (!) 104 (!) 108  Resp: 17  16 16   Temp: 98.4 F (36.9 C)  98.6 F (37 C) 98.3 F (36.8 C)  TempSrc: Oral  Oral Oral  SpO2: 100%  98% 99%  Weight:      Height:        Intake/Output Summary (Last 24 hours) at 09/15/2024 1313 Last data filed at 09/15/2024 0900 Gross per 24 hour  Intake 840 ml  Output --  Net 840 ml   Filed Weights   09/13/24 0651  Weight: 80.3 kg    Examination:  General exam: Appears calm and comfortable  Respiratory system: Clear to auscultation. Decreased effort Cardiovascular system: S1 & S2 heard, RRR.   Gastrointestinal system: Abdomen is mildly distended, nontender Central nervous system: Alert and oriented. No focal neurological deficits. Extremities: Symmetric 5 x 5 power. Skin: No rashes, lesions or ulcers Psychiatry: Judgement and insight appear normal. Mood & affect appropriate.     Data Reviewed: I have personally reviewed following labs and imaging studies  CBC: Recent Labs  Lab 09/13/24 0615 09/14/24 0700 09/14/24 1049  09/15/24 0530  WBC 17.6* 10.3  --  14.4*  NEUTROABS 13.9*  --   --   --   HGB 7.6* 6.5* 7.0* 8.0*  HCT 25.4* 22.7*  --  27.1*  MCV 75.1* 79.4*  --  77.0*  PLT 608* 294  --  566*   Basic Metabolic Panel: Recent Labs  Lab 09/13/24 0615 09/14/24 0700 09/15/24 0530  NA 139 139 137  K 3.0* 4.5 4.0  CL 100 107 102  CO2 23 22 23   GLUCOSE 181* 92 118*  BUN 21 13 17   CREATININE 1.31* 0.81 1.01*  CALCIUM  8.6* 8.5* 9.0  MG 1.5*  --   --    GFR: Estimated Creatinine Clearance: 53.5 mL/min (A) (by C-G formula based on SCr of 1.01 mg/dL (H)). Liver Function Tests: Recent Labs  Lab 09/13/24 0615  AST 31  ALT 14  ALKPHOS 66  BILITOT 0.4  PROT 6.9  ALBUMIN  2.6*   No results for input(s): LIPASE, AMYLASE in the last 168 hours. No results for input(s): AMMONIA in the last 168 hours. Coagulation Profile: No results for input(s): INR, PROTIME in the last 168 hours. Cardiac Enzymes: No results for input(s): CKTOTAL, CKMB, CKMBINDEX, TROPONINI in the last 168 hours. BNP (last 3 results) No results for input(s): PROBNP in the last 8760 hours. HbA1C: No results for input(s): HGBA1C in the last 72 hours. CBG: No results for input(s): GLUCAP in the last 168 hours. Lipid Profile: No results for input(s): CHOL, HDL, LDLCALC, TRIG, CHOLHDL, LDLDIRECT in the last 72 hours. Thyroid  Function Tests: No results for input(s): TSH, T4TOTAL, FREET4, T3FREE, THYROIDAB in the last 72 hours. Anemia Panel: Recent Labs    09/13/24 0615  FERRITIN 10*   Urine analysis:    Component Value Date/Time   COLORURINE YELLOW (A) 10/20/2023 2005   APPEARANCEUR CLOUDY (A) 10/20/2023 2005   LABSPEC 1.006 10/20/2023 2005   PHURINE 6.0 10/20/2023 2005   GLUCOSEU 50 (A) 10/20/2023 2005   HGBUR SMALL (A) 10/20/2023 2005   BILIRUBINUR negative 07/28/2024 1414   KETONESUR NEGATIVE 10/20/2023 2005   PROTEINUR Negative 07/28/2024 1414   PROTEINUR 30 (A)  10/20/2023 2005   UROBILINOGEN 0.2 07/28/2024 1414   NITRITE negative 07/28/2024 1414   NITRITE NEGATIVE 10/20/2023 2005   LEUKOCYTESUR Trace (A) 07/28/2024 1414   LEUKOCYTESUR LARGE (A) 10/20/2023 2005   Sepsis Labs: @LABRCNTIP (procalcitonin:4,lacticidven:4)  )No results found for this or any previous visit (from the past 240 hours).       Radiology Studies: No results found.       Scheduled Meds:  atorvastatin   80 mg Oral QHS   budesonide   0.25 mg Nebulization BID   busPIRone   15 mg Oral BID   citalopram   40 mg Oral q morning   levETIRAcetam   750 mg Oral q morning   linaclotide   290 mcg Oral  QAC breakfast   pantoprazole  (PROTONIX ) IV  40 mg Intravenous Q12H   polyethylene glycol  17 g Oral Daily   senna  1 tablet Oral Daily   Continuous Infusions:   LOS: 0 days     Jean KATHEE Ban, MD [facility]   If 7PM-7AM, please contact night-coverage [email or web address] Password [email or web address] 09/15/2024, 1:13 PM

## 2024-09-15 NOTE — Plan of Care (Signed)

## 2024-09-15 NOTE — Care Management Obs Status (Signed)
 MEDICARE OBSERVATION STATUS NOTIFICATION   Patient Details  Name: Jean Gomez MRN: [id number] Date of Birth: 1956/12/14   Medicare Observation Status Notification Given:  Yes    Malu Pellegrini W, CMA 09/15/2024, 12:05 PM

## 2024-09-15 NOTE — Progress Notes (Signed)
 Patient heart rate has been between 100-120 intermittently over the past 24 hours. Patient is otherwise asymptomatic. No change in care plan at this time.

## 2024-09-16 LAB — CBC
HCT: 25 % — ABNORMAL LOW (ref 36.0–46.0)
Hemoglobin: 7.2 g/dL — ABNORMAL LOW (ref 12.0–15.0)
MCH: 22.2 pg — ABNORMAL LOW (ref 26.0–34.0)
MCHC: 28.8 g/dL — ABNORMAL LOW (ref 30.0–36.0)
MCV: 77.2 fL — ABNORMAL LOW (ref 80.0–100.0)
Platelets: 531 K/uL — ABNORMAL HIGH (ref 150–400)
RBC: 3.24 MIL/uL — ABNORMAL LOW (ref 3.87–5.11)
RDW: 20.7 % — ABNORMAL HIGH (ref 11.5–15.5)
WBC: 10.5 K/uL (ref 4.0–10.5)
nRBC: 0 % (ref 0.0–0.2)

## 2024-09-16 LAB — BASIC METABOLIC PANEL WITH GFR
Anion gap: 7 (ref 5–15)
BUN: 14 mg/dL (ref 8–23)
CO2: 27 mmol/L (ref 22–32)
Calcium: 8.6 mg/dL — ABNORMAL LOW (ref 8.9–10.3)
Chloride: 105 mmol/L (ref 98–111)
Creatinine, Ser: 0.74 mg/dL (ref 0.44–1.00)
GFR, Estimated: >60 mL/min (ref >60)
Glucose, Bld: 94 mg/dL (ref 70–99)
Potassium: 4 mmol/L (ref 3.5–5.1)
Sodium: 139 mmol/L (ref 135–145)

## 2024-09-16 LAB — GLUCOSE, CAPILLARY: Glucose-Capillary: 151 mg/dL — ABNORMAL HIGH (ref 70–99)

## 2024-09-16 MED ORDER — PANTOPRAZOLE SODIUM 40 MG PO TBEC
40.0000 mg | DELAYED_RELEASE_TABLET | Freq: Two times a day (BID) | ORAL | Status: DC
  Administered 2024-09-16 – 2024-09-23 (×14): 40 mg via ORAL
  Filled 2024-09-16 (×14): qty 1

## 2024-09-16 NOTE — TOC Initial Note (Signed)
 Transition of Care [facility]) - Initial/Assessment Note    Patient Details  Name: Jean Gomez MRN: [id number] Date of Birth: 28-Jun-1956  [facility] CM/SW Contact:    Alvaro Louder, LCSW Phone Number: 09/16/2024, 9:39 AM  Clinical Narrative:                 Per Chart review patient from Home, LCSWA Faxed out information to SNF's in [place]. LCSWA will present Facilities to patient at the bedside.  TOC to follow for discharge       Patient Goals and CMS Choice            Expected Discharge Plan and Services                                              Prior Living Arrangements/Services                       Activities of Daily Living   ADL Screening (condition at time of admission) Independently performs ADLs?: Yes (appropriate for developmental age) Is the patient deaf or have difficulty hearing?: No Does the patient have difficulty seeing, even when wearing glasses/contacts?: No Does the patient have difficulty concentrating, remembering, or making decisions?: No  Permission Sought/Granted                  Emotional Assessment              Admission diagnosis:  Hypokalemia [E87.6] Hypomagnesemia [E83.42] Rib fractures [S22.49XA] RLQ abdominal pain [R10.31] Prolonged QT interval [R94.31] Flank pain [R10.9] Minor head injury, initial encounter [S09.90XA] Fall, initial encounter [W19.XXXA] Acute pain of right knee [M25.561] Closed fracture of multiple ribs of right side, initial encounter [S22.41XA] Closed fracture of multiple ribs of both sides, initial encounter [S22.43XA] Patient Active Problem List   Diagnosis Date Noted   Rib fractures 09/13/2024   Acute on chronic anemia 09/13/2024   Hypokalemia 10/23/2023   Syncope 10/20/2023   Sepsis due to gram-negative UTI (HCC) 10/20/2023   Dyslipidemia 10/20/2023   Essential hypertension 10/20/2023   Peptic ulcer disease 10/20/2023   Dementia, presenile with  depression (HCC) 10/20/2023   Acute blood loss anemia 10/16/2023   Dehydration 10/16/2023   Acute gastric ulcer without hemorrhage or perforation 10/01/2023   Acute GI bleeding 09/29/2023   Generalized weakness 09/29/2023   Diarrhea 08/30/2023   Sepsis (HCC) 08/30/2023   Multiple gastric ulcers 08/30/2023   AKI (acute kidney injury) 08/30/2023   Anemia of chronic disease 08/30/2023   Other spondylosis, sacral and sacrococcygeal region 07/01/2020   History of Allergy to iodine 01/27/2020    Class: History of   Lumbosacral spondylosis with radiculopathy (Right) 01/05/2020   Neurogenic pain 10/29/2019   Osteoarthritis of hip (Right) 10/09/2019   Greater trochanteric bursitis (Right) 10/09/2019   Lumbosacral spinal stenosis 10/09/2019   Lumbosacral (intervertebral disc displacement) (IVDD) 10/09/2019   Lumbar nerve root impingement (Left: L4) (Right: L5) 10/09/2019   Grade 1 Anterolisthesis of L5/S1 10/09/2019   Lumbar facet arthropathy (L3-4, L4-5, L5-S1) 10/09/2019   Lumbosacral facet joint synovial cyst (Right: L5-S1) 10/09/2019   Lumbar foraminal stenosis (Bilateral: L2-S1) 07/10/2019   Abnormal MRI, lumbar spine (07/02/2019) 07/03/2019   Disturbance of skin sensation 06/16/2019   Chronic low back pain (Secondary Area of Pain) (Bilateral) (R>L) w/o sciatica 01/16/2019   Acute on  chronic respiratory failure with hypoxia (HCC) 12/18/2018   Cervicogenic headache 10/21/2018   Lumbar spondylosis 06/25/2018   History of allergy to radiographic contrast media 05/30/2018   Bipolar affective disorder, current episode manic (HCC) 04/17/2018   Chronic hip pain (Tertiary Area of Pain) (Bilateral) (R>L) 02/26/2018   DDD (degenerative disc disease), cervical 02/26/2018   Cervical facet arthropathy (Bilateral) 02/26/2018   Cervical facet syndrome (Bilateral) (R>L) 02/26/2018   Cervical Grade 1 Anterolisthesis of C4 over C5 (Degenerative) 02/26/2018   Cervical Grade 1 Retrolisthesis of C5 over  C6 02/26/2018   Cervical foraminal stenosis (C3-4, C4-5, and C5-6) (Bilateral) 02/26/2018   DDD (degenerative disc disease), lumbosacral 02/26/2018   Osteoarthritis of lumbar spine 02/26/2018   Osteoarthritis of facet joint of lumbar spine 02/26/2018   Spondylosis without myelopathy or radiculopathy, cervicothoracic region 02/26/2018   Occipital headache (Right) 02/25/2018   Frontal headache (Left) 02/25/2018   Chronic low back pain (Midline) (Secondary Area of Pain) 02/25/2018   Chronic lumbar radiculopathy (Right) 02/25/2018   Osteoarthritis involving multiple joints 02/25/2018   Osteoarthritis of shoulder (Bilateral) 02/25/2018   Chronic musculoskeletal pain 02/25/2018   Lumbar facet syndrome (Bilateral) (R>L) 02/25/2018   Vitamin D  deficiency 02/14/2018   Chronic pain syndrome 02/07/2018   Pharmacologic therapy 02/07/2018   Disorder of skeletal system 02/07/2018   Problems influencing health status 02/07/2018   Long term current use of opiate analgesic 02/07/2018   Chronic low back pain (Secondary Area of Pain) (Bilateral) (R>L) w/ sciatica (Right) 02/07/2018   Chronic lower extremity pain (Tertiary Area of Pain) (Bilateral) (R>L) 02/07/2018   Chronic knee pain (Fourth Area of Pain) (Bilateral) (R>L) 02/07/2018   Chronic shoulder pain (Fifth Area of Pain) (Bilateral) (R>L) 02/07/2018   Occipital neuralgia (Primary Area of Pain) (Bilateral) (R>L) 02/07/2018   Chronic neck pain (Bilateral) (R>L) 02/07/2018   Chronic sacroiliac joint pain (Bilateral) (R>L) 02/07/2018   H/O aneurysm 01/30/2018   History of depression 01/30/2018   History of seizure disorder 01/30/2018   Chronic headache disorder (Primary Area of Pain) 01/30/2018   Chronic migraine without aura 01/17/2018   Chronic respiratory failure with hypoxia (HCC) 01/17/2018   Constipation 01/17/2018   HTN, goal below 140/90 01/17/2018   Hypercholesterolemia 01/17/2018   Major depression in remission 01/17/2018   COPD  (chronic obstructive pulmonary disease) (HCC) 01/17/2018   Sarcoidosis 01/17/2018   Seizure disorder (HCC) 01/17/2018   Cerebral aneurysm 01/17/2018   Nonruptured cerebral aneurysm 10/22/2017   Obstructive sleep apnea on CPAP 09/11/2017   Generalized anxiety disorder 01/26/2017   Irritable bowel syndrome 01/26/2017   Pulmonary emphysema (HCC) 01/26/2017   GERD (gastroesophageal reflux disease) 08/02/2016   Hiatal hernia 08/02/2016   Essential tremor 07/19/2016   Muscle spasm 10/05/2015   Spondylosis without myelopathy or radiculopathy, lumbosacral region 10/05/2015   Rotator cuff injury 09/17/2015   Chronic shoulder pain (Right) 09/17/2015   Epilepsy (HCC) 07/14/2015   Insomnia 07/14/2015   Osteoarthritis of knee (Bilateral) (R>L) 06/24/2015   PCP:  Freddrick, No Pharmacy:   [facility] - [place], [place] - [address] [place] [address] Phone: [phone] Fax: [phone]     Social Drivers of Health (SDOH) Social History: SDOH Screenings   Food Insecurity: No Food Insecurity (09/13/2024)  Housing: Low Risk  (09/13/2024)  Transportation Needs: No Transportation Needs (09/13/2024)  Utilities: Not At Risk (09/13/2024)  Alcohol Screen: Low Risk  (04/17/2018)  Depression (PHQ2-9): High Risk (07/28/2024)  Financial Resource Strain: Low Risk  (07/29/2024)  Received from [facility]  Physical Activity: Unknown (03/05/2018)  Social Connections: Unknown (09/13/2024)  Stress: No Stress Concern Present (03/05/2018)  Tobacco Use: Low Risk  (09/13/2024)   SDOH Interventions:     Readmission Risk Interventions     No data to display

## 2024-09-16 NOTE — Progress Notes (Signed)
 Mobility Specialist Progress Note:    09/16/24 0852  Mobility  Activity Stood at bedside;Pivoted/transferred from bed to chair  Level of Assistance Minimal assist, patient does 75% or more  Assistive Device Front wheel walker  Distance Ambulated (ft) 3 ft  Range of Motion/Exercises Active;All extremities  Activity Response Tolerated well  Mobility visit 1 Mobility  Mobility Specialist Start Time (ACUTE ONLY) [id number]  Mobility Specialist Stop Time (ACUTE ONLY) 0847  Mobility Specialist Time Calculation (min) (ACUTE ONLY) 11 min   Pt received requesting assistance to chair, nursing students in room with mobility specialist. Required MinA to stand and transfer with RW. Tolerated well, SpO2 90% on 2L and HR in the 130's. Recovered, SpO2 94% on 2L and HR 114 bpm. Alarm on, belongings in reach. All needs met.  Sherrilee Ditty Mobility Specialist Please contact via Special educational needs teacher or  Rehab office at [phone]

## 2024-09-16 NOTE — Progress Notes (Signed)
 PROGRESS NOTE    Jean Gomez  FMW:[id number] DOB: [birth year] DOA: 09/13/2024 PCP: Pcp, No     Brief Narrative:   From admission h and p  Jean Gomez is a 68 y.o. female with medical history significant for copd, on o2, seizures, chronic pain, polypharmacy, presents after fall.   [place] bowling [place] when lost her grip and fell striking right side of chest and right side of head. No laceration, no LOC. Otherwise feeling at her baseline, no fevers or nausea/vomiting. Reports dark Cropley stool, does appear to be taking asa and indocin .      Assessment & Plan:   Principal Problem:   Rib fractures Active Problems:   Chronic pain syndrome   Chronic musculoskeletal pain   COPD (chronic obstructive pulmonary disease) (HCC)   Chronic respiratory failure with hypoxia (HCC)   Epilepsy (HCC)   Obstructive sleep apnea on CPAP   Sarcoidosis   Bipolar affective disorder, current episode manic (HCC)   Essential hypertension   Dementia, presenile with depression (HCC)   Acute on chronic anemia  # Rib fractures Right 3-7 and left 2nd after fall, no complications thus far. No other apparent significant injury, ct head and neck, right knee all neg - pain control - IS - PT advising snf, toc consulted, patient agrees   # Acute on chronic microcytic anemia Ulcer disease seen on egd last year, hgb 7s from baseline 9-10. Appears taking asa and indocin  at home. GI has seen, declines eval for now though would re-consider if dropping hgb or signs overt bleeding. Hgb stable in the 7s, no bleeding - advance diet - ppi bid - monitor hgb   # Hypokalemia # Hypomagnesemia Resolved - monitor   # AKI Mild, 1.31 from baseline of normal. Resolved with fluids - monitor off fluids   # COPD # Chronic hypoxic respiratory failure No exacerbation, stable on home 3 liters - continue home o2 and home inhalers   # Leukocytosis No signs/symptoms of infection otherwise. Possibly  reactive - monitor   # Chronic pain - cont home oxy, hold home indocin  - hydromorphone  prn for rib fracture   # Dementia - hold home aricept  given qtc prolongation   # prolonged qtc - will hold aricept  and mirtazapine  for now   # MDD/gad - hold home mirtaz as above - cont home celexa , buspar , xanax    # Chronic constipation - home linzess    # Seizure disorder - home keppra    # HTN Bp low normal - home meds on hold   DVT prophylaxis: SCDs for now given concern for possible GI bleed Code Status: full code (confirmed with patient and daughter on 9/30) Family Communication: daughter tammi at bedside 9/30  Level of care: Med-Surg Status is: inpg    Consultants:  GI (signed off)  Procedures: none  Antimicrobials:  none    Subjective: Reports continued slight improvement in right rib pain, breathing stable no cough  Objective: Vitals:   09/15/24 2014 09/16/24 0433 09/16/24 0809 09/16/24 0811  BP:  104/72  127/77  Pulse:  (!) 116    Resp:  18  17  Temp:  98.6 F (37 C)  98.6 F (37 C)  TempSrc:    Oral  SpO2: 96% 98% 99% 100%  Weight:      Height:        Intake/Output Summary (Last 24 hours) at 09/16/2024 1234 Last data filed at 09/16/2024 0905 Gross per 24 hour  Intake 360 ml  Output --  Net 360 ml   Filed Weights   09/13/24 0651  Weight: 80.3 kg    Examination:  General exam: Appears calm and comfortable  Respiratory system: Clear to auscultation. Decreased effort Cardiovascular system: S1 & S2 heard, RRR.   Gastrointestinal system: Abdomen is mildly distended, nontender Central nervous system: Alert and oriented. No focal neurological deficits. Extremities: Symmetric 5 x 5 power. Skin: No rashes, lesions or ulcers Psychiatry: Judgement and insight appear normal. Mood & affect appropriate.     Data Reviewed: I have personally reviewed following labs and imaging studies  CBC: Recent Labs  Lab 09/13/24 0615 09/14/24 0700  09/14/24 1049 09/15/24 0530 09/16/24 0538  WBC 17.6* 10.3  --  14.4* 10.5  NEUTROABS 13.9*  --   --   --   --   HGB 7.6* 6.5* 7.0* 8.0* 7.2*  HCT 25.4* 22.7*  --  27.1* 25.0*  MCV 75.1* 79.4*  --  77.0* 77.2*  PLT 608* 294  --  566* 531*   Basic Metabolic Panel: Recent Labs  Lab 09/13/24 0615 09/14/24 0700 09/15/24 0530 09/16/24 0538  NA 139 139 137 139  K 3.0* 4.5 4.0 4.0  CL 100 107 102 105  CO2 23 22 23 27   GLUCOSE 181* 92 118* 94  BUN 21 13 17 14   CREATININE 1.31* 0.81 1.01* 0.74  CALCIUM  8.6* 8.5* 9.0 8.6*  MG 1.5*  --   --   --    GFR: Estimated Creatinine Clearance: 67.6 mL/min (by C-G formula based on SCr of 0.74 mg/dL). Liver Function Tests: Recent Labs  Lab 09/13/24 0615  AST 31  ALT 14  ALKPHOS 66  BILITOT 0.4  PROT 6.9  ALBUMIN  2.6*   No results for input(s): LIPASE, AMYLASE in the last 168 hours. No results for input(s): AMMONIA in the last 168 hours. Coagulation Profile: No results for input(s): INR, PROTIME in the last 168 hours. Cardiac Enzymes: No results for input(s): CKTOTAL, CKMB, CKMBINDEX, TROPONINI in the last 168 hours. BNP (last 3 results) No results for input(s): PROBNP in the last 8760 hours. HbA1C: No results for input(s): HGBA1C in the last 72 hours. CBG: No results for input(s): GLUCAP in the last 168 hours. Lipid Profile: No results for input(s): CHOL, HDL, LDLCALC, TRIG, CHOLHDL, LDLDIRECT in the last 72 hours. Thyroid  Function Tests: No results for input(s): TSH, T4TOTAL, FREET4, T3FREE, THYROIDAB in the last 72 hours. Anemia Panel: No results for input(s): VITAMINB12, FOLATE, FERRITIN, TIBC, IRON, RETICCTPCT in the last 72 hours.  Urine analysis:    Component Value Date/Time   COLORURINE YELLOW (A) 10/20/2023 2005   APPEARANCEUR CLOUDY (A) 10/20/2023 2005   LABSPEC 1.006 10/20/2023 2005   PHURINE 6.0 10/20/2023 2005   GLUCOSEU 50 (A) 10/20/2023 2005   HGBUR  SMALL (A) 10/20/2023 2005   BILIRUBINUR negative 07/28/2024 1414   KETONESUR NEGATIVE 10/20/2023 2005   PROTEINUR Negative 07/28/2024 1414   PROTEINUR 30 (A) 10/20/2023 2005   UROBILINOGEN 0.2 07/28/2024 1414   NITRITE negative 07/28/2024 1414   NITRITE NEGATIVE 10/20/2023 2005   LEUKOCYTESUR Trace (A) 07/28/2024 1414   LEUKOCYTESUR LARGE (A) 10/20/2023 2005   Sepsis Labs: @LABRCNTIP (procalcitonin:4,lacticidven:4)  )No results found for this or any previous visit (from the past 240 hours).       Radiology Studies: No results found.       Scheduled Meds:  atorvastatin   80 mg Oral QHS   budesonide   0.25 mg Nebulization BID   busPIRone   15  mg Oral BID   citalopram   40 mg Oral q morning   levETIRAcetam   750 mg Oral q morning   linaclotide   290 mcg Oral QAC breakfast   pantoprazole  (PROTONIX ) IV  40 mg Intravenous Q12H   polyethylene glycol  17 g Oral Daily   senna  1 tablet Oral Daily   Continuous Infusions:   LOS: 1 day     Devaughn KATHEE Ban, MD [facility]   If 7PM-7AM, please contact night-coverage [email or web address] Password [email or web address] 09/16/2024, 12:34 PM

## 2024-09-16 NOTE — Progress Notes (Signed)
  Chaplain On-Call responded to Spiritual Care Consult Order from RN Andrill.  Chaplain met the patient and her daughter Zorita Batter. They stated their desire to create the Health Care Power of Attorney document.  Chaplain attempted to locate two Hospital Volunteers to serve as Witnesses, but none will be available until later this afternoon.  Chaplain will refer to the Oncoming Chaplain for follow up.  Chaplain Bebe Ardean EMERSON Hershal., [facility]

## 2024-09-16 NOTE — NC FL2 (Signed)
 [place]  MEDICAID [id number] LEVEL OF CARE FORM     IDENTIFICATION  Patient Name: AUDRE CENCI Birthdate: March 08, 1956 Sex: female Admission Date (Current Location): 09/13/2024  [facility] and [place] Number:  Chiropodist and Address:  [facility], [address], [place], [place] [address]      Provider Number: [id number]  Attending Physician Name and Address:  Kandis Devaughn Sayres, MD  Relative Name and Phone Number:       Current Level of Care: Hospital Recommended Level of Care: Skilled Nursing Facility Prior Approval Number:    Date Approved/Denied:   PASRR Number:    Discharge Plan: SNF    Current Diagnoses: Patient Active Problem List   Diagnosis Date Noted   Rib fractures 09/13/2024   Acute on chronic anemia 09/13/2024   Hypokalemia 10/23/2023   Syncope 10/20/2023   Sepsis due to gram-negative UTI (HCC) 10/20/2023   Dyslipidemia 10/20/2023   Essential hypertension 10/20/2023   Peptic ulcer disease 10/20/2023   Dementia, presenile with depression (HCC) 10/20/2023   Acute blood loss anemia 10/16/2023   Dehydration 10/16/2023   Acute gastric ulcer without hemorrhage or perforation 10/01/2023   Acute GI bleeding 09/29/2023   Generalized weakness 09/29/2023   Diarrhea 08/30/2023   Sepsis (HCC) 08/30/2023   Multiple gastric ulcers 08/30/2023   AKI (acute kidney injury) 08/30/2023   Anemia of chronic disease 08/30/2023   Other spondylosis, sacral and sacrococcygeal region 07/01/2020   History of Allergy to iodine 01/27/2020   Lumbosacral spondylosis with radiculopathy (Right) 01/05/2020   Neurogenic pain 10/29/2019   Osteoarthritis of hip (Right) 10/09/2019   Greater trochanteric bursitis (Right) 10/09/2019   Lumbosacral spinal stenosis 10/09/2019   Lumbosacral (intervertebral disc displacement) (IVDD) 10/09/2019   Lumbar nerve root impingement (Left: L4) (Right: L5) 10/09/2019   Grade 1 Anterolisthesis of L5/S1 10/09/2019    Lumbar facet arthropathy (L3-4, L4-5, L5-S1) 10/09/2019   Lumbosacral facet joint synovial cyst (Right: L5-S1) 10/09/2019   Lumbar foraminal stenosis (Bilateral: L2-S1) 07/10/2019   Abnormal MRI, lumbar spine (07/02/2019) 07/03/2019   Disturbance of skin sensation 06/16/2019   Chronic low back pain (Secondary Area of Pain) (Bilateral) (R>L) w/o sciatica 01/16/2019   Acute on chronic respiratory failure with hypoxia (HCC) 12/18/2018   Cervicogenic headache 10/21/2018   Lumbar spondylosis 06/25/2018   History of allergy to radiographic contrast media 05/30/2018   Bipolar affective disorder, current episode manic (HCC) 04/17/2018   Chronic hip pain (Tertiary Area of Pain) (Bilateral) (R>L) 02/26/2018   DDD (degenerative disc disease), cervical 02/26/2018   Cervical facet arthropathy (Bilateral) 02/26/2018   Cervical facet syndrome (Bilateral) (R>L) 02/26/2018   Cervical Grade 1 Anterolisthesis of C4 over C5 (Degenerative) 02/26/2018   Cervical Grade 1 Retrolisthesis of C5 over C6 02/26/2018   Cervical foraminal stenosis (C3-4, C4-5, and C5-6) (Bilateral) 02/26/2018   DDD (degenerative disc disease), lumbosacral 02/26/2018   Osteoarthritis of lumbar spine 02/26/2018   Osteoarthritis of facet joint of lumbar spine 02/26/2018   Spondylosis without myelopathy or radiculopathy, cervicothoracic region 02/26/2018   Occipital headache (Right) 02/25/2018   Frontal headache (Left) 02/25/2018   Chronic low back pain (Midline) (Secondary Area of Pain) 02/25/2018   Chronic lumbar radiculopathy (Right) 02/25/2018   Osteoarthritis involving multiple joints 02/25/2018   Osteoarthritis of shoulder (Bilateral) 02/25/2018   Chronic musculoskeletal pain 02/25/2018   Lumbar facet syndrome (Bilateral) (R>L) 02/25/2018   Vitamin D  deficiency 02/14/2018   Chronic pain syndrome 02/07/2018   Pharmacologic therapy 02/07/2018   Disorder of  skeletal system 02/07/2018   Problems influencing health status 02/07/2018    Long term current use of opiate analgesic 02/07/2018   Chronic low back pain (Secondary Area of Pain) (Bilateral) (R>L) w/ sciatica (Right) 02/07/2018   Chronic lower extremity pain (Tertiary Area of Pain) (Bilateral) (R>L) 02/07/2018   Chronic knee pain (Fourth Area of Pain) (Bilateral) (R>L) 02/07/2018   Chronic shoulder pain (Fifth Area of Pain) (Bilateral) (R>L) 02/07/2018   Occipital neuralgia (Primary Area of Pain) (Bilateral) (R>L) 02/07/2018   Chronic neck pain (Bilateral) (R>L) 02/07/2018   Chronic sacroiliac joint pain (Bilateral) (R>L) 02/07/2018   H/O aneurysm 01/30/2018   History of depression 01/30/2018   History of seizure disorder 01/30/2018   Chronic headache disorder (Primary Area of Pain) 01/30/2018   Chronic migraine without aura 01/17/2018   Chronic respiratory failure with hypoxia (HCC) 01/17/2018   Constipation 01/17/2018   HTN, goal below 140/90 01/17/2018   Hypercholesterolemia 01/17/2018   Major depression in remission 01/17/2018   COPD (chronic obstructive pulmonary disease) (HCC) 01/17/2018   Sarcoidosis 01/17/2018   Seizure disorder (HCC) 01/17/2018   Cerebral aneurysm 01/17/2018   Nonruptured cerebral aneurysm 10/22/2017   Obstructive sleep apnea on CPAP 09/11/2017   Generalized anxiety disorder 01/26/2017   Irritable bowel syndrome 01/26/2017   Pulmonary emphysema (HCC) 01/26/2017   GERD (gastroesophageal reflux disease) 08/02/2016   Hiatal hernia 08/02/2016   Essential tremor 07/19/2016   Muscle spasm 10/05/2015   Spondylosis without myelopathy or radiculopathy, lumbosacral region 10/05/2015   Rotator cuff injury 09/17/2015   Chronic shoulder pain (Right) 09/17/2015   Epilepsy (HCC) 07/14/2015   Insomnia 07/14/2015   Osteoarthritis of knee (Bilateral) (R>L) 06/24/2015    Orientation RESPIRATION BLADDER Height & Weight     Self, Time, Situation, Place  O2 (2 L) Continent Weight: 177 lb (80.3 kg) Height:  5' 3 (160 cm)  BEHAVIORAL  SYMPTOMS/MOOD NEUROLOGICAL BOWEL NUTRITION STATUS      Continent Diet (Regular)  AMBULATORY STATUS COMMUNICATION OF NEEDS Skin     Verbally Normal                       Personal Care Assistance Level of Assistance  Bathing, Feeding, Dressing Bathing Assistance: Limited assistance Feeding assistance: Independent Dressing Assistance: Limited assistance     Functional Limitations Info  Sight, Hearing, Speech Sight Info: Adequate Hearing Info: Adequate Speech Info: Adequate    SPECIAL CARE FACTORS FREQUENCY  PT (By licensed PT), OT (By licensed OT)     PT Frequency: 5x/week OT Frequency: 5x/week            Contractures      Additional Factors Info  Code Status, Allergies Code Status Info: DNR Limited Allergies Info: Penicillins, Contrast Media (Iodinated Contrast Media), Gabapentin, Labetalol, Lactose Intolerance (Gi), Sulfabenzamide, Pregabalin            Current Medications (09/16/2024):  This is the current hospital active medication list Current Facility-Administered Medications  Medication Dose Route Frequency Provider Last Rate Last Admin   albuterol  (PROVENTIL ) (2.5 MG/3ML) 0.083% nebulizer solution 3 mL  3 mL Inhalation Q6H PRN Kandis Devaughn Sayres, MD   3 mL at 09/15/24 0220   ALPRAZolam  (XANAX ) tablet 0.25 mg  0.25 mg Oral QHS PRN Kandis Devaughn Sayres, MD   0.25 mg at 09/13/24 2222   atorvastatin  (LIPITOR) tablet 80 mg  80 mg Oral QHS Kandis Devaughn Sayres, MD   80 mg at 09/15/24 2050   budesonide  (PULMICORT ) nebulizer solution 0.25 mg  0.25 mg Nebulization BID Lenon Elsie HERO, RPH   0.25 mg at 09/16/24 9192   busPIRone  (BUSPAR ) tablet 15 mg  15 mg Oral BID Kandis Devaughn Sayres, MD   15 mg at 09/16/24 9097   citalopram  (CELEXA ) tablet 40 mg  40 mg Oral q morning Kandis Devaughn Sayres, MD   40 mg at 09/16/24 0902   HYDROmorphone  (DILAUDID ) injection 1-2 mg  1-2 mg Intravenous Q3H PRN Kandis Devaughn Sayres, MD   1 mg at 09/16/24 9096   levETIRAcetam  (KEPPRA ) tablet 750  mg  750 mg Oral q morning Kandis Devaughn Sayres, MD   750 mg at 09/16/24 9093   linaclotide  (LINZESS ) capsule 290 mcg  290 mcg Oral QAC breakfast Kandis Devaughn Sayres, MD   290 mcg at 09/16/24 9093   oxyCODONE  (Oxy IR/ROXICODONE ) immediate release tablet 10 mg  10 mg Oral TID PRN Kandis Devaughn Sayres, MD   10 mg at 09/15/24 0210   pantoprazole  (PROTONIX ) injection 40 mg  40 mg Intravenous Q12H Kandis Devaughn Sayres, MD   40 mg at 09/16/24 0902   polyethylene glycol (MIRALAX  / GLYCOLAX ) packet 17 g  17 g Oral Daily Kandis Devaughn Sayres, MD   17 g at 09/16/24 0901   senna (SENOKOT) tablet 8.6 mg  1 tablet Oral Daily Kandis Devaughn Sayres, MD   8.6 mg at 09/16/24 0902     Discharge Medications: Please see discharge summary for a list of discharge medications.  Relevant Imaging Results:  Relevant Lab Results:   Additional Information SSN: [id number]  Alvaro Louder, LCSW

## 2024-09-16 NOTE — TOC Progression Note (Signed)
 Transition of Care [facility]) - Progression Note    Patient Details  Name: ELIRA COLASANTI MRN: [id number] Date of Birth: 07/04/1956  [facility] CM/SW Contact  Delphine KANDICE Bring, RN Phone Number: 09/16/2024, 12:13 PM  Clinical Narrative:    Cm received a message that patient's daughter Madelin , [phone] ,want to talk with case manager. She spoke with daughter via phone.   Daughter asked about placement and the process. Cm explained that covering SW has sent out referrals. Cm gave her the facilities that have declined patient and which facilities we are still waiting for a bed offer. Daughter states [facility] is her first choice. Daughter states that she is working on health POA  Prior to admission , patient was living at home alone without home services. Patient wears oxygen  daily from [employer]. Daughter states that patient was able to do her ADLs and used cane/ rolliator for ambulation.                       Expected Discharge Plan and Services                                               Social Drivers of Health (SDOH) Interventions SDOH Screenings   Food Insecurity: No Food Insecurity (09/13/2024)  Housing: Low Risk  (09/13/2024)  Transportation Needs: No Transportation Needs (09/13/2024)  Utilities: Not At Risk (09/13/2024)  Alcohol Screen: Low Risk  (04/17/2018)  Depression (PHQ2-9): High Risk (07/28/2024)  Financial Resource Strain: Low Risk  (07/29/2024)   Received from [facility]  Physical Activity: Unknown (03/05/2018)  Social Connections: Unknown (09/13/2024)  Stress: No Stress Concern Present (03/05/2018)  Tobacco Use: Low Risk  (09/13/2024)    Readmission Risk Interventions     No data to display

## 2024-09-16 NOTE — Plan of Care (Signed)
  Problem: Health Behavior/Discharge Planning: Goal: Ability to manage health-related needs will improve Outcome: Progressing   Problem: Clinical Measurements: Goal: Ability to maintain clinical measurements within normal limits will improve Outcome: Progressing Goal: Will remain free from infection Outcome: Progressing Goal: Respiratory complications will improve Outcome: Progressing Goal: Cardiovascular complication will be avoided Outcome: Progressing   Problem: Activity: Goal: Risk for activity intolerance will decrease Outcome: Progressing   Problem: Nutrition: Goal: Adequate nutrition will be maintained Outcome: Progressing   Problem: Coping: Goal: Level of anxiety will decrease Outcome: Progressing   Problem: Elimination: Goal: Will not experience complications related to urinary retention Outcome: Progressing   Problem: Safety: Goal: Ability to remain free from injury will improve Outcome: Progressing   Problem: Skin Integrity: Goal: Risk for impaired skin integrity will decrease Outcome: Progressing   

## 2024-09-16 NOTE — Progress Notes (Signed)
   09/16/24 1400  Spiritual Encounters  Type of Visit Initial  Care provided to: Patient  Conversation partners present during encounter Nurse  Referral source Patient request  Reason for visit Advance directives  OnCall Visit No   Chaplain visited patient per [facility] Consult in the [facility] system.  Chaplain confirmed patient had already completed documents and began process of finding notary and 2 witnesses.  Notary and 2 witnesses were secured.  Documents were notarized and Chaplain provided patient with original and copies (left in windowsill, per patient request) and scanned document to applicable department.  Patient requested prayer for healing and for loved one and Chaplain prayed with patient.  Patient also shared she's looking for a Church home.    Rev. Rana M. Nicholaus, M.Div. Chaplain Resident [facility]

## 2024-09-16 NOTE — Plan of Care (Signed)
   Problem: Activity: Goal: Risk for activity intolerance will decrease Outcome: Progressing   Problem: Pain Managment: Goal: General experience of comfort will improve and/or be controlled Outcome: Progressing   Problem: Safety: Goal: Ability to remain free from injury will improve Outcome: Progressing

## 2024-09-16 NOTE — Progress Notes (Signed)
 PT Cancellation Note  Patient Details Name: Jean Gomez MRN: [id number] DOB: [birth year]   Cancelled Treatment:    Reason Eval/Treat Not Completed: Medical issues which prohibited therapy  Pt up with mobility team this am.  Pt resting in chair. HR 130 at rest, sats 90% on 2 lpm.  Will return at a later time/date.   Lauraine Gills 09/16/2024, 12:23 PM

## 2024-09-17 DIAGNOSIS — S2243XA Multiple fractures of ribs, bilateral, initial encounter for closed fracture: Secondary | ICD-10-CM | POA: Diagnosis not present

## 2024-09-17 DIAGNOSIS — G894 Chronic pain syndrome: Secondary | ICD-10-CM

## 2024-09-17 DIAGNOSIS — D649 Anemia, unspecified: Secondary | ICD-10-CM

## 2024-09-17 LAB — CBC
HCT: 23.7 % — ABNORMAL LOW (ref 36.0–46.0)
Hemoglobin: 7 g/dL — ABNORMAL LOW (ref 12.0–15.0)
MCH: 22.6 pg — ABNORMAL LOW (ref 26.0–34.0)
MCHC: 29.5 g/dL — ABNORMAL LOW (ref 30.0–36.0)
MCV: 76.5 fL — ABNORMAL LOW (ref 80.0–100.0)
Platelets: 520 K/uL — ABNORMAL HIGH (ref 150–400)
RBC: 3.1 MIL/uL — ABNORMAL LOW (ref 3.87–5.11)
RDW: 20.9 % — ABNORMAL HIGH (ref 11.5–15.5)
WBC: 10.4 K/uL (ref 4.0–10.5)
nRBC: 0 % (ref 0.0–0.2)

## 2024-09-17 MED ORDER — IRON SUCROSE 300 MG IVPB - SIMPLE MED
300.0000 mg | Freq: Once | Status: AC
Start: 2024-09-17 — End: 2024-09-17
  Administered 2024-09-17: 300 mg via INTRAVENOUS
  Filled 2024-09-17: qty 300

## 2024-09-17 NOTE — Plan of Care (Signed)
  Problem: Pain Managment: Goal: General experience of comfort will improve and/or be controlled Outcome: Progressing   Problem: Safety: Goal: Ability to remain free from injury will improve Outcome: Progressing   Problem: Elimination: Goal: Will not experience complications related to bowel motility Outcome: Progressing

## 2024-09-17 NOTE — Hospital Course (Addendum)
  Jean Gomez is a 68 y.o. female with medical history significant for copd, on o2, seizures, chronic pain, polypharmacy, presents after fall. [place] when lost her grip and fell striking right side of chest and right side of head.  Chest x-ray showed a right 3-7 and a left second rib fractures.  Patient was given pain medicine. Patient was also having acute on chronic anemia, but no evidence of bleeding. Pain is treated with pain medicine.  She is also required 2 L oxygen  appear to be chronic in nature. Additionally improved, patient had insurance approved for placement.  Will discharge today.

## 2024-09-17 NOTE — Progress Notes (Signed)
 Physical Therapy Treatment Patient Details Name: Jean Gomez MRN: [id number] DOB: 25-Aug-1956 Today's Date: 09/17/2024   History of Present Illness Jean Gomez is a 68 y.o. female with medical history significant for copd, on o2, seizures, chronic pain, polypharmacy, presents after fall.    PT Comments  Pt ready for session.  Pre-medicated.  HR 103 at rest.  Sats 95% on 2.5 lpm.  She moves slowly to EOB but motivated to try on her own.  She does need light assist to bring RLE to EOB due to pulling on chest with movement.  Once sitting she is steady.  HR 128 and sats dec to 87%.  Encouraged deep breathing.  Stated she is at 3 lpm at baseline at home so O2 was increased. She is able to walk around bed to recliner with RW and slow gait.  HR 135 with sats dec to 88%.  Pt remained in recliner with needs met and safety on.  LEft at 3 lpm and discussed with RN.    If plan is discharge home, recommend the following: A little help with walking and/or transfers;A little help with bathing/dressing/bathroom;Assist for transportation;Assistance with cooking/housework;Help with stairs or ramp for entrance   Can travel by private vehicle        Equipment Recommendations       Recommendations for Other Services       Precautions / Restrictions Precautions Precautions: Fall Restrictions Weight Bearing Restrictions Per Provider Order: No     Mobility  Bed Mobility Overal bed mobility: Needs Assistance Bed Mobility: Supine to Sit   Sidelying to sit: HOB elevated, Used rails, Min assist       General bed mobility comments: moves slow.  does need light assist for RLE to EOB Patient Response: Cooperative  Transfers Overall transfer level: Needs assistance Equipment used: Rolling walker (2 wheels) Transfers: Sit to/from Stand Sit to Stand: Contact guard assist, Min assist                Ambulation/Gait Ambulation/Gait assistance: Min assist Gait Distance (Feet): 10  Feet Assistive device: Rolling walker (2 wheels) Gait Pattern/deviations: Step-through pattern, Decreased step length - right, Decreased step length - left Gait velocity: dec     General Gait Details: slow to move   Stairs             Wheelchair Mobility     Tilt Bed Tilt Bed Patient Response: Cooperative  Modified Rankin (Stroke Patients Only)       Balance Overall balance assessment: Needs assistance Sitting-balance support: Feet supported Sitting balance-Leahy Scale: Good     Standing balance support: Bilateral upper extremity supported Standing balance-Leahy Scale: Fair                              Hotel manager: No apparent difficulties  Cognition Arousal: Alert Behavior During Therapy: WFL for tasks assessed/performed   PT - Cognitive impairments: No apparent impairments                         Following commands: Intact      Cueing Cueing Techniques: Verbal cues  Exercises      General Comments        Pertinent Vitals/Pain Pain Assessment Pain Assessment: Faces Faces Pain Scale: Hurts even more Pain Location: R side rib area Pain Descriptors / Indicators: Aching Pain Intervention(s): Limited activity within patient's tolerance, Monitored during session,  Premedicated before session, Repositioned    Home Living                          Prior Function            PT Goals (current goals can now be found in the care plan section) Progress towards PT goals: Progressing toward goals    Frequency    Min 2X/week      PT Plan      Co-evaluation              AM-PAC PT 6 Clicks Mobility   Outcome Measure  Help needed turning from your back to your side while in a flat bed without using bedrails?: A Little Help needed moving from lying on your back to sitting on the side of a flat bed without using bedrails?: A Little Help needed moving to and from a bed to a  chair (including a wheelchair)?: A Little Help needed standing up from a chair using your arms (e.g., wheelchair or bedside chair)?: A Little Help needed to walk in hospital room?: A Little Help needed climbing 3-5 steps with a railing? : A Lot 6 Click Score: 17    End of Session   Activity Tolerance: Patient tolerated treatment well Patient left: in chair;with chair alarm set;with call bell/phone within reach Nurse Communication: Mobility status PT Visit Diagnosis: Unsteadiness on feet (R26.81);Other abnormalities of gait and mobility (R26.89);Difficulty in walking, not elsewhere classified (R26.2);Muscle weakness (generalized) (M62.81)     Time: 8975-8963 PT Time Calculation (min) (ACUTE ONLY): 12 min  Charges:    $Gait Training: 8-22 mins PT General Charges $$ ACUTE PT VISIT: 1 Visit                   Lauraine Gills, PTA 09/17/24, 10:45 AM

## 2024-09-17 NOTE — Progress Notes (Signed)
 Progress Note   Patient: Jean Gomez FMW:[id number] DOB: August 09, 1956 DOA: 09/13/2024     2 DOS: the patient was seen and examined on 09/17/2024   Brief hospital course:  Jean Gomez is a 68 y.o. female with medical history significant for copd, on o2, seizures, chronic pain, polypharmacy, presents after fall. [place] her grip and fell striking right side of chest and right side of head.  Chest x-ray showed a right 3-7 and a left second rib fractures.  Patient was given pain medicine. Patient was also having acute on chronic anemia, but no evidence of bleeding. Pain is treated with pain medicine.  She is also required 2 L oxygen  appear to be chronic in nature   Principal Problem:   Rib fractures Active Problems:   Chronic pain syndrome   Chronic musculoskeletal pain   COPD (chronic obstructive pulmonary disease) (HCC)   Chronic respiratory failure with hypoxia (HCC)   Epilepsy (HCC)   Obstructive sleep apnea on CPAP   Sarcoidosis   Bipolar affective disorder, current episode manic (HCC)   Essential hypertension   Dementia, presenile with depression (HCC)   Acute on chronic anemia   Assessment and Plan: # Rib fractures Right 3-7 and left 2nd after fall. Still requiring IV Dilaudid  and as needed Percocet.  Pain seems to be better, discontinue IV Dilaudid , continue oral Percocet.  Started incentive spirometer.   # Acute on chronic microcytic anemia Iron deficient anemia. Reactive thrombocytosis. Recent iron studies showed significant iron deficiency without B12 deficiency. Hemoglobin today 7.0.  Will give IV iron. Recheck CBC tomorrow.  Will transfuse below 7, patient agreeable for blood transfusion.   # Hypokalemia # Hypomagnesemia Acute kidney injury. Condition all improved, recheck levels tomorrow.   # COPD # Chronic hypoxic respiratory failure Condition stable.    # Chronic pain - cont home oxy, hold home indocin  -  hydromorphone  prn for rib fracture   # Dementia - hold home aricept  given qtc prolongation   # prolonged qtc - will hold aricept  and mirtazapine  for now   # MDD/gad - hold home mirtaz as above - cont home celexa , buspar , xanax    # Chronic constipation - home linzess    # Seizure disorder - home keppra    # HTN Bp low normal - home meds on hold         Subjective:  Patient still unable to take deep breath.  No shortness of breath.  Physical Exam: Vitals:   09/17/24 0448 09/17/24 0744 09/17/24 0817 09/17/24 0858  BP: 132/82  119/75 111/79  Pulse: 94  (!) 111 (!) 109  Resp: 19  20   Temp: 98.9 F (37.2 C)  98.9 F (37.2 C)   TempSrc: Oral     SpO2: 98% 98% 98% 99%  Weight:      Height:       General exam: Appears calm and comfortable  Respiratory system: Decreased breath sounds. Respiratory effort normal. Cardiovascular system: S1 & S2 heard, RRR. No JVD, murmurs, rubs, gallops or clicks. No pedal edema. Gastrointestinal system: Abdomen is nondistended, soft and nontender. No organomegaly or masses felt. Normal bowel sounds heard. Central nervous system: Alert and oriented. No focal neurological deficits. Extremities: Symmetric 5 x 5 power. Skin: No rashes, lesions or ulcers Psychiatry: Judgement and insight appear normal. Mood & affect appropriate.    Data Reviewed:  X-ray and lab results reviewed.  Family Communication: None  Disposition: Status is: Inpatient Remains inpatient  appropriate because: Severity of disease, IV treatment.     Time spent: 35 minutes  Author: Murvin Mana, MD 09/17/2024 2:32 PM  For on call review [email or web address].

## 2024-09-18 DIAGNOSIS — J449 Chronic obstructive pulmonary disease, unspecified: Secondary | ICD-10-CM

## 2024-09-18 DIAGNOSIS — S2243XA Multiple fractures of ribs, bilateral, initial encounter for closed fracture: Secondary | ICD-10-CM | POA: Diagnosis not present

## 2024-09-18 DIAGNOSIS — D649 Anemia, unspecified: Secondary | ICD-10-CM | POA: Diagnosis not present

## 2024-09-18 LAB — BASIC METABOLIC PANEL WITH GFR
Anion gap: 8 (ref 5–15)
BUN: 6 mg/dL — ABNORMAL LOW (ref 8–23)
CO2: 28 mmol/L (ref 22–32)
Calcium: 8.3 mg/dL — ABNORMAL LOW (ref 8.9–10.3)
Chloride: 101 mmol/L (ref 98–111)
Creatinine, Ser: 0.42 mg/dL — ABNORMAL LOW (ref 0.44–1.00)
GFR, Estimated: >60 mL/min (ref >60)
Glucose, Bld: 86 mg/dL (ref 70–99)
Potassium: 3.7 mmol/L (ref 3.5–5.1)
Sodium: 137 mmol/L (ref 135–145)

## 2024-09-18 LAB — CBC
HCT: 23.2 % — ABNORMAL LOW (ref 36.0–46.0)
Hemoglobin: 6.7 g/dL — ABNORMAL LOW (ref 12.0–15.0)
MCH: 22.1 pg — ABNORMAL LOW (ref 26.0–34.0)
MCHC: 28.9 g/dL — ABNORMAL LOW (ref 30.0–36.0)
MCV: 76.6 fL — ABNORMAL LOW (ref 80.0–100.0)
Platelets: 499 K/uL — ABNORMAL HIGH (ref 150–400)
RBC: 3.03 MIL/uL — ABNORMAL LOW (ref 3.87–5.11)
RDW: 20.5 % — ABNORMAL HIGH (ref 11.5–15.5)
WBC: 11 K/uL — ABNORMAL HIGH (ref 4.0–10.5)
nRBC: 0.3 % — ABNORMAL HIGH (ref 0.0–0.2)

## 2024-09-18 LAB — PHOSPHORUS: Phosphorus: 3.2 mg/dL (ref 2.5–4.6)

## 2024-09-18 LAB — PREPARE RBC (CROSSMATCH)

## 2024-09-18 LAB — MAGNESIUM: Magnesium: 1.8 mg/dL (ref 1.7–2.4)

## 2024-09-18 MED ORDER — SODIUM CHLORIDE 0.9% IV SOLUTION
Freq: Once | INTRAVENOUS | Status: AC
Start: 2024-09-18 — End: 2024-09-18

## 2024-09-18 MED ORDER — LACTULOSE 10 GM/15ML PO SOLN
20.0000 g | Freq: Once | ORAL | Status: AC
  Administered 2024-09-18: 20 g via ORAL
  Filled 2024-09-18: qty 30

## 2024-09-18 MED ORDER — SENNOSIDES-DOCUSATE SODIUM 8.6-50 MG PO TABS
2.0000 | ORAL_TABLET | Freq: Two times a day (BID) | ORAL | Status: DC
  Administered 2024-09-18 – 2024-09-23 (×8): 2 via ORAL
  Filled 2024-09-18 (×11): qty 2

## 2024-09-18 NOTE — Progress Notes (Signed)
 Physical Therapy Treatment Patient Details Name: Jean Gomez MRN: [id number] DOB: 27-May-1956 Today's Date: 09/18/2024   History of Present Illness Jean Gomez Jean Gomez is a 68 y.o. female with medical history significant for copd, on o2, seizures, chronic pain, polypharmacy, presents after fall.    PT Comments  Pt seen this am for modified session due to Hgb at 6.7 pt awaiting transfusion. Therefore pt seen for B LE strengthening exercises in supine with good tolerance. Pt requested use of BSC and assisted with transfer. No c/o dizziness while up, however HR remains high at 136bpm with SpO2 dropping to mid 80's with minimal exertion on baseline 2L O2. Will progress once Hgb returns to safe level. Pt left in bed with all needs in reach.    If plan is discharge home, recommend the following: A little help with walking and/or transfers;A little help with bathing/dressing/bathroom;Assist for transportation;Assistance with cooking/housework;Help with stairs or ramp for entrance   Can travel by private vehicle     Yes  Equipment Recommendations  None recommended by PT (TBD at next level of care)    Recommendations for Other Services       Precautions / Restrictions Precautions Precautions: Fall Recall of Precautions/Restrictions: Intact Restrictions Weight Bearing Restrictions Per Provider Order: No     Mobility  Bed Mobility Overal bed mobility: Needs Assistance Bed Mobility: Supine to Sit, Sit to Supine     Supine to sit: Min assist, HOB elevated, Used rails Sit to supine: Min assist (For B LE's)        Transfers Overall transfer level: Needs assistance Equipment used: 1 person hand held assist Transfers: Sit to/from Stand, Bed to chair/wheelchair/BSC Sit to Stand: Contact guard assist   Step pivot transfers: Min assist       General transfer comment:  (bed<>BSC only due to low Hgb)    Ambulation/Gait               General Gait Details:  (Deferred due to low  Hgb)   Stairs             Wheelchair Mobility     Tilt Bed    Modified Rankin (Stroke Patients Only)       Balance Overall balance assessment: Needs assistance Sitting-balance support: Feet supported Sitting balance-Leahy Scale: Good     Standing balance support: Bilateral upper extremity supported Standing balance-Leahy Scale: Fair                              Hotel manager: No apparent difficulties  Cognition Arousal: Alert Behavior During Therapy: WFL for tasks assessed/performed   PT - Cognitive impairments: No apparent impairments                       PT - Cognition Comments:  (Pleasant and cooperative) Following commands: Intact      Cueing Cueing Techniques: Verbal cues  Exercises General Exercises - Lower Extremity Ankle Circles/Pumps: AROM, Both, 10 reps, Supine Heel Slides: AROM, Both, 10 reps, Supine Hip ABduction/ADduction: AROM, Both, 10 reps, Supine    General Comments General comments (skin integrity, edema, etc.):  (Pt educated on proper use of IS for lung expansion, good teach back demonstration x 10 up to 700cc)      Pertinent Vitals/Pain Pain Assessment Pain Assessment: Faces Faces Pain Scale: Hurts little more Pain Location: R side rib area Pain Descriptors / Indicators: Aching Pain Intervention(s): Monitored during  session    Home Living                          Prior Function            PT Goals (current goals can now be found in the care plan section) Acute Rehab PT Goals Patient Stated Goal: Pt wants to get stronger. Pt open to going to SNF if needed Progress towards PT goals: Progressing toward goals    Frequency    Min 2X/week      PT Plan      Co-evaluation              AM-PAC PT 6 Clicks Mobility   Outcome Measure  Help needed turning from your back to your side while in a flat bed without using bedrails?: A Lot Help needed moving  from lying on your back to sitting on the side of a flat bed without using bedrails?: A Lot Help needed moving to and from a bed to a chair (including a wheelchair)?: A Little Help needed standing up from a chair using your arms (e.g., wheelchair or bedside chair)?: A Little Help needed to walk in hospital room?: A Little Help needed climbing 3-5 steps with a railing? : A Lot 6 Click Score: 15    End of Session   Activity Tolerance: Patient tolerated treatment well Patient left: in bed;with call bell/phone within reach;with bed alarm set Nurse Communication: Mobility status PT Visit Diagnosis: Unsteadiness on feet (R26.81);Other abnormalities of gait and mobility (R26.89);Difficulty in walking, not elsewhere classified (R26.2);Muscle weakness (generalized) (M62.81)     Time: 9046-8979 PT Time Calculation (min) (ACUTE ONLY): 27 min  Charges:    $Therapeutic Exercise: 8-22 mins $Therapeutic Activity: 8-22 mins PT General Charges $$ ACUTE PT VISIT: 1 Visit                    Darice Bohr, PTA  [name] 09/18/2024, 11:33 AM

## 2024-09-18 NOTE — Progress Notes (Signed)
 Progress Note   Patient: Jean Gomez FMW:[id number] DOB: Nov 06, 1956 DOA: 09/13/2024     3 DOS: the patient was seen and examined on 09/18/2024   Brief hospital course:  Jean Gomez is a 68 y.o. female with medical history significant for copd, on o2, seizures, chronic pain, polypharmacy, presents after fall. [place] lost her grip and fell striking right side of chest and right side of head.  Chest x-ray showed a right 3-7 and a left second rib fractures.  Patient was given pain medicine. Patient was also having acute on chronic anemia, but no evidence of bleeding. Pain is treated with pain medicine.  She is also required 2 L oxygen  appear to be chronic in nature   Principal Problem:   Rib fractures Active Problems:   Chronic pain syndrome   Chronic musculoskeletal pain   COPD (chronic obstructive pulmonary disease) (HCC)   Chronic respiratory failure with hypoxia (HCC)   Epilepsy (HCC)   Obstructive sleep apnea on CPAP   Sarcoidosis   Bipolar affective disorder, current episode manic (HCC)   Essential hypertension   Dementia, presenile with depression (HCC)   Acute on chronic anemia   Assessment and Plan:  # Rib fractures Right 3-7 and left 2nd after fall. Still requiring IV Dilaudid  and as needed Percocet.  Pain seems to be better, discontinued IV Dilaudid , continue oral Percocet.  Started incentive spirometer.   # Acute on chronic microcytic anemia Iron deficient anemia. Reactive thrombocytosis. Recent iron studies showed significant iron deficiency without B12 deficiency. Received IV iron on 10/1, hemoglobin dropped down to 6.7, transfuse 1 unit PRBC.  Recheck CBC tomorrow.   # Hypokalemia # Hypomagnesemia Acute kidney injury. Condition all improved.     # COPD # Chronic hypoxic respiratory failure Condition stable.  Constipation. Last recorded bowel movement was 9/24, give a dose of lactulose in additional to scheduled  senna.     # Chronic pain - cont home oxy, hold home indocin  - hydromorphone  prn for rib fracture   # Dementia - hold home aricept  given qtc prolongation   # prolonged qtc - will hold aricept  and mirtazapine  for now   # MDD/gad - hold home mirtaz as above - cont home celexa , buspar , xanax    # Chronic constipation - home linzess    # Seizure disorder - home keppra    # HTN Bp low normal - home meds on hold      Subjective:  Has not had a bowel movement for 8 days, does not have any abdominal pain or nausea vomiting.  Physical Exam: Vitals:   09/18/24 0739 09/18/24 1047 09/18/24 1104 09/18/24 1104  BP:  (!) 117/92 131/85 131/85  Pulse:  (!) 107 (!) 107 (!) 107  Resp:  18 18 18   Temp:  98.7 F (37.1 C) 98.8 F (37.1 C) 98.8 F (37.1 C)  TempSrc:  Oral Oral Oral  SpO2: 96% 100% 97% 97%  Weight:      Height:       General exam: Appears calm and comfortable  Respiratory system: Clear to auscultation. Respiratory effort normal. Cardiovascular system: S1 & S2 heard, RRR. No JVD, murmurs, rubs, gallops or clicks. No pedal edema. Gastrointestinal system: Abdomen is nondistended, soft and nontender. No organomegaly or masses felt. Normal bowel sounds heard. Central nervous system: Alert and oriented. No focal neurological deficits. Extremities: Symmetric 5 x 5 power. Skin: No rashes, lesions or ulcers Psychiatry: Judgement and insight appear normal. Mood &  affect appropriate.    Data Reviewed:  Lab results reviewed.  Family Communication: None  Disposition: Status is: Inpatient Remains inpatient appropriate because: Severity of disease, unsafe discharge, nursing home placement.     Time spent: 35 minutes  Author: Murvin Mana, MD 09/18/2024 1:07 PM  For on call review [email or web address].

## 2024-09-18 NOTE — Plan of Care (Signed)

## 2024-09-18 NOTE — Care Management Important Message (Signed)
 Important Message  Patient Details  Name: Jean Gomez MRN: [id number] Date of Birth: May 21, 1956   Important Message Given:  Yes - Medicare IM     Baya Lentz W, CMA 09/18/2024, 11:18 AM

## 2024-09-19 DIAGNOSIS — J449 Chronic obstructive pulmonary disease, unspecified: Secondary | ICD-10-CM | POA: Diagnosis not present

## 2024-09-19 DIAGNOSIS — D649 Anemia, unspecified: Secondary | ICD-10-CM | POA: Diagnosis not present

## 2024-09-19 DIAGNOSIS — S2243XA Multiple fractures of ribs, bilateral, initial encounter for closed fracture: Secondary | ICD-10-CM | POA: Diagnosis not present

## 2024-09-19 LAB — CBC
HCT: 32 % — ABNORMAL LOW (ref 36.0–46.0)
Hemoglobin: 9.5 g/dL — ABNORMAL LOW (ref 12.0–15.0)
MCH: 22.4 pg — ABNORMAL LOW (ref 26.0–34.0)
MCHC: 29.7 g/dL — ABNORMAL LOW (ref 30.0–36.0)
MCV: 75.5 fL — ABNORMAL LOW (ref 80.0–100.0)
Platelets: 537 K/uL — ABNORMAL HIGH (ref 150–400)
RBC: 4.24 MIL/uL (ref 3.87–5.11)
RDW: 20.7 % — ABNORMAL HIGH (ref 11.5–15.5)
WBC: 11.9 K/uL — ABNORMAL HIGH (ref 4.0–10.5)
nRBC: 0.3 % — ABNORMAL HIGH (ref 0.0–0.2)

## 2024-09-19 LAB — BPAM RBC
Blood Product Expiration Date: 202510292359
ISSUE DATE / TIME: 202510021038
Unit Type and Rh: 6200

## 2024-09-19 LAB — TYPE AND SCREEN
ABO/RH(D): A POS
Antibody Screen: NEGATIVE
Unit division: 0

## 2024-09-19 NOTE — TOC Progression Note (Signed)
 Transition of Care [facility]) - Progression Note    Patient Details  Name: Jean Gomez MRN: [id number] Date of Birth: [birth year]  Transition of Care [facility]) CM/SW Contact  Alvaro Louder, [place] Phone Number: 09/19/2024, 12:34 PM  Clinical Narrative:   LCSWA spoke with Daughter Tammi. LCSWA informed Tammi that [facility] declined and she would have to choose another SNF. She indicated that she would review options and make a decision by the end of the day.   TOC to follow for discharge                      Expected Discharge Plan and Services                                               Social Drivers of Health (SDOH) Interventions SDOH Screenings   Food Insecurity: No Food Insecurity (09/13/2024)  Housing: Low Risk  (09/13/2024)  Transportation Needs: No Transportation Needs (09/13/2024)  Utilities: Not At Risk (09/13/2024)  Alcohol Screen: Low Risk  (04/17/2018)  Depression (PHQ2-9): High Risk (07/28/2024)  Financial Resource Strain: Low Risk  (07/29/2024)   Received from [facility]  Physical Activity: Unknown (03/05/2018)  Social Connections: Unknown (09/13/2024)  Stress: No Stress Concern Present (03/05/2018)  Tobacco Use: Low Risk  (09/13/2024)    Readmission Risk Interventions     No data to display

## 2024-09-19 NOTE — Progress Notes (Signed)
 PT Cancellation Note  Patient Details Name: Jean Gomez MRN: [id number] DOB: 09-23-1956   Cancelled Treatment:     In to see pt this am who states she was up in the chair for 2 hours already with nursing and declines PT at this time. Pt appears to be doing well functionally, however initial recs for STR remain appropriate in order to return to functional independent baseline.   [name] 09/19/2024, 11:39 AM

## 2024-09-19 NOTE — Plan of Care (Signed)

## 2024-09-19 NOTE — Progress Notes (Signed)
  Progress Note   Patient: Jean Gomez FMW:[id number] DOB: 23-Oct-1956 DOA: 09/13/2024     4 DOS: the patient was seen and examined on 09/19/2024   Brief hospital course:  Jean Gomez is a 68 y.o. female with medical history significant for copd, on o2, seizures, chronic pain, polypharmacy, presents after fall. [place] her grip and fell striking right side of chest and right side of head.  Chest x-ray showed a right 3-7 and a left second rib fractures.  Patient was given pain medicine. Patient was also having acute on chronic anemia, but no evidence of bleeding. Pain is treated with pain medicine.  She is also required 2 L oxygen  appear to be chronic in nature   Principal Problem:   Rib fractures Active Problems:   Chronic pain syndrome   Chronic musculoskeletal pain   COPD (chronic obstructive pulmonary disease) (HCC)   Chronic respiratory failure with hypoxia (HCC)   Epilepsy (HCC)   Obstructive sleep apnea on CPAP   Sarcoidosis   Bipolar affective disorder, current episode manic (HCC)   Essential hypertension   Dementia, presenile with depression (HCC)   Acute on chronic anemia   Assessment and Plan:  # Rib fractures Right 3-7 and left 2nd after fall. Still requiring IV Dilaudid  and as needed Percocet.  Pain seems to be better, discontinued IV Dilaudid , continue oral Percocet.  Started incentive spirometer. Patient still has some chest pain with breathing, overall improving.  Continue oral pain medicine.   # Acute on chronic microcytic anemia Iron deficient anemia. Reactive thrombocytosis. Recent iron studies showed significant iron deficiency without B12 deficiency. Received IV iron on 10/1, hemoglobin dropped down to 6.7, transfuse 1 unit PRBC 10/2.  Hemoglobin improved to 9.7 today.  Patient has no evidence of any bleeding.   # Hypokalemia # Hypomagnesemia Acute kidney injury. Condition all improved.     # COPD # Chronic  hypoxic respiratory failure Condition stable.   Constipation. 3 large bowel movements after lactulose.        Subjective:  Patient doing well today, still has some rib pain.  Otherwise no complaint.  Had 3 bowel movements after lactulose.  Physical Exam: Vitals:   09/18/24 2104 09/19/24 0515 09/19/24 0700 09/19/24 0804  BP: 136/84 (!) 160/76  (!) 136/92  Pulse: 98 (!) 110  (!) 108  Resp: 20 16  18   Temp: 99 F (37.2 C) 99.3 F (37.4 C)  97.9 F (36.6 C)  TempSrc: Oral Oral  Oral  SpO2: 98% 99% 97% 100%  Weight:      Height:       General exam: Appears calm and comfortable  Respiratory system: Decreased breathing sounds, respiratory effort normal. Cardiovascular system: S1 & S2 heard, RRR. No JVD, murmurs, rubs, gallops or clicks. No pedal edema. Gastrointestinal system: Abdomen is nondistended, soft and nontender. No organomegaly or masses felt. Normal bowel sounds heard. Central nervous system: Alert and oriented. No focal neurological deficits. Extremities: Symmetric 5 x 5 power. Skin: No rashes, lesions or ulcers Psychiatry: Judgement and insight appear normal. Mood & affect appropriate.    Data Reviewed:  Lab results reviewed  Family Communication: None  Disposition: Status is: Inpatient Remains inpatient appropriate because: Severity of disease, unsafe discharge pending nursing home placement.     Time spent: 35 minutes  Author: Murvin Mana, MD 09/19/2024 10:17 AM  For on call review [email or web address].

## 2024-09-20 DIAGNOSIS — J449 Chronic obstructive pulmonary disease, unspecified: Secondary | ICD-10-CM | POA: Diagnosis not present

## 2024-09-20 DIAGNOSIS — S2243XA Multiple fractures of ribs, bilateral, initial encounter for closed fracture: Secondary | ICD-10-CM | POA: Diagnosis not present

## 2024-09-20 DIAGNOSIS — J9611 Chronic respiratory failure with hypoxia: Secondary | ICD-10-CM | POA: Diagnosis not present

## 2024-09-20 MED ORDER — OXYCODONE HCL ER 10 MG PO T12A
10.0000 mg | EXTENDED_RELEASE_TABLET | Freq: Two times a day (BID) | ORAL | Status: DC
  Administered 2024-09-20 – 2024-09-23 (×6): 10 mg via ORAL
  Filled 2024-09-20 (×6): qty 1

## 2024-09-20 MED ORDER — OXYCODONE HCL 5 MG PO TABS
5.0000 mg | ORAL_TABLET | Freq: Four times a day (QID) | ORAL | Status: DC | PRN
Start: 2024-09-20 — End: 2024-09-21
  Administered 2024-09-20 – 2024-09-21 (×3): 5 mg via ORAL
  Filled 2024-09-20 (×3): qty 1

## 2024-09-20 MED ORDER — LIDOCAINE 5 % EX PTCH
1.0000 | MEDICATED_PATCH | CUTANEOUS | Status: DC
  Administered 2024-09-20 – 2024-09-22 (×3): 1 via TRANSDERMAL
  Filled 2024-09-20 (×3): qty 1

## 2024-09-20 MED ORDER — CYCLOBENZAPRINE HCL 10 MG PO TABS
5.0000 mg | ORAL_TABLET | Freq: Three times a day (TID) | ORAL | Status: DC | PRN
Start: 2024-09-20 — End: 2024-09-23
  Administered 2024-09-20 – 2024-09-22 (×5): 5 mg via ORAL
  Filled 2024-09-20 (×4): qty 1
  Filled 2024-09-20: qty 0.5
  Filled 2024-09-20: qty 1

## 2024-09-20 NOTE — Progress Notes (Signed)
  Progress Note   Patient: Jean Gomez FMW:[id number] DOB: 11-Mar-1956 DOA: 09/13/2024     5 DOS: the patient was seen and examined on 09/20/2024   Brief hospital course:  KRISTOPHER DELK is a 68 y.o. female with medical history significant for copd, on o2, seizures, chronic pain, polypharmacy, presents after fall. [place] her grip and fell striking right side of chest and right side of head.  Chest x-ray showed a right 3-7 and a left second rib fractures.  Patient was given pain medicine. Patient was also having acute on chronic anemia, but no evidence of bleeding. Pain is treated with pain medicine.  She is also required 2 L oxygen  appear to be chronic in nature   Principal Problem:   Rib fractures Active Problems:   Chronic pain syndrome   Chronic musculoskeletal pain   COPD (chronic obstructive pulmonary disease) (HCC)   Chronic respiratory failure with hypoxia (HCC)   Epilepsy (HCC)   Obstructive sleep apnea on CPAP   Sarcoidosis   Bipolar affective disorder, current episode manic (HCC)   Essential hypertension   Dementia, presenile with depression (HCC)   Acute on chronic anemia   Assessment and Plan: # Rib fractures Right 3-7 and left 2nd after fall. Still requiring IV Dilaudid  and as needed Percocet.  Pain seems to be better, discontinued IV Dilaudid , continue oral Percocet.  Started incentive spirometer. Patient still has some chest pain with breathing, overall improving.  Continue oral pain medicine. Condition improving.   # Acute on chronic microcytic anemia Iron deficient anemia. Reactive thrombocytosis. Recent iron studies showed significant iron deficiency without B12 deficiency. Received IV iron on 10/1, hemoglobin dropped down to 6.7, received 1 unit PRBC 10/2.  Recheck CBC tomorrow.   # Hypokalemia # Hypomagnesemia Acute kidney injury. Condition all improved.     # COPD # Chronic hypoxic respiratory  failure Condition stable.   Constipation. 3 large bowel movements after lactulose.   Arm pain from prior IV infusion. Lidoderm  patch started.       Subjective:  Patient complaining some pain in the right arm at the IV site.  Mild swelling.  Physical Exam: Vitals:   09/19/24 1953 09/20/24 0457 09/20/24 0726 09/20/24 0753  BP: 124/85 128/83  (!) 124/93  Pulse: 95 99  98  Resp: 17 17  17   Temp: 98 F (36.7 C) 98 F (36.7 C)  98.6 F (37 C)  TempSrc:  Oral  Oral  SpO2: 99% 99% 99% 98%  Weight:      Height:       General exam: Appears calm and comfortable  Respiratory system: Clear to auscultation. Respiratory effort normal. Cardiovascular system: S1 & S2 heard, RRR. No JVD, murmurs, rubs, gallops or clicks. No pedal edema. Gastrointestinal system: Abdomen is nondistended, soft and nontender. No organomegaly or masses felt. Normal bowel sounds heard. Central nervous system: Alert and oriented. No focal neurological deficits. Extremities: Symmetric 5 x 5 power. Skin: No rashes, lesions or ulcers Psychiatry: Judgement and insight appear normal. Mood & affect appropriate.    Data Reviewed:  There are no new results to review at this time.  Family Communication: None  Disposition: Status is: Inpatient Remains inpatient appropriate because: Unsafe discharge, pending nursing home placement     Time spent: 35 minutes  Author: Murvin Mana, MD 09/20/2024 11:15 AM  For on call review [email or web address].

## 2024-09-21 DIAGNOSIS — G894 Chronic pain syndrome: Secondary | ICD-10-CM | POA: Diagnosis not present

## 2024-09-21 DIAGNOSIS — I10 Essential (primary) hypertension: Secondary | ICD-10-CM

## 2024-09-21 DIAGNOSIS — S2243XA Multiple fractures of ribs, bilateral, initial encounter for closed fracture: Secondary | ICD-10-CM | POA: Diagnosis not present

## 2024-09-21 LAB — CBC
HCT: 30.3 % — ABNORMAL LOW (ref 36.0–46.0)
Hemoglobin: 9.2 g/dL — ABNORMAL LOW (ref 12.0–15.0)
MCH: 23.1 pg — ABNORMAL LOW (ref 26.0–34.0)
MCHC: 30.4 g/dL (ref 30.0–36.0)
MCV: 76.1 fL — ABNORMAL LOW (ref 80.0–100.0)
Platelets: 444 K/uL — ABNORMAL HIGH (ref 150–400)
RBC: 3.98 MIL/uL (ref 3.87–5.11)
RDW: 20.7 % — ABNORMAL HIGH (ref 11.5–15.5)
WBC: 13.3 K/uL — ABNORMAL HIGH (ref 4.0–10.5)
nRBC: 0.2 % (ref 0.0–0.2)

## 2024-09-21 MED ORDER — NYSTATIN 100000 UNIT/GM EX POWD
Freq: Two times a day (BID) | CUTANEOUS | Status: DC
  Filled 2024-09-21: qty 15

## 2024-09-21 MED ORDER — OXYCODONE HCL 5 MG PO TABS
5.0000 mg | ORAL_TABLET | Freq: Four times a day (QID) | ORAL | Status: DC | PRN
  Administered 2024-09-21 – 2024-09-23 (×4): 5 mg via ORAL
  Filled 2024-09-21 (×4): qty 1

## 2024-09-21 NOTE — Progress Notes (Signed)
  Progress Note   Patient: Jean Gomez FMW:[id number] DOB: October 24, 1956 DOA: 09/13/2024     6 DOS: the patient was seen and examined on 09/21/2024   Brief hospital course:  Jean Gomez is a 68 y.o. female with medical history significant for copd, on o2, seizures, chronic pain, polypharmacy, presents after fall. [place] lost her grip and fell striking right side of chest and right side of head.  Chest x-ray showed a right 3-7 and a left second rib fractures.  Patient was given pain medicine. Patient was also having acute on chronic anemia, but no evidence of bleeding. Pain is treated with pain medicine.  She is also required 2 L oxygen  appear to be chronic in nature   Principal Problem:   Rib fractures Active Problems:   Chronic pain syndrome   Chronic musculoskeletal pain   COPD (chronic obstructive pulmonary disease) (HCC)   Chronic respiratory failure with hypoxia (HCC)   Epilepsy (HCC)   Obstructive sleep apnea on CPAP   Sarcoidosis   Bipolar affective disorder, current episode manic (HCC)   Essential hypertension   Dementia, presenile with depression (HCC)   Acute on chronic anemia   Assessment and Plan: # Rib fractures Right 3-7 and left 2nd after fall. Still requiring IV Dilaudid  and as needed Percocet.  Pain seems to be better, discontinued IV Dilaudid , continue oral Percocet.  Started incentive spirometer. Patient and family still concerning for an adequate pain control.  I have changed OxyContin  10 mg every 12 hours plus hydrocodone  5 mg every 6 hours as needed.  Pain better   # Acute on chronic microcytic anemia Iron deficient anemia. Reactive thrombocytosis. Recent iron studies showed significant iron deficiency without B12 deficiency. Received IV iron on 10/1, hemoglobin dropped down to 6.7, received 1 unit PRBC 10/2.  Still stable   # Hypokalemia # Hypomagnesemia Acute kidney injury. Condition all improved.     # COPD #  Chronic hypoxic respiratory failure Condition stable.   Constipation. Condition resolved.         Subjective:  Patient doing well, pain better controlled today.  Has daily bowel movements  Physical Exam: Vitals:   09/20/24 2022 09/21/24 0518 09/21/24 0520 09/21/24 0811  BP:  (!) 149/89  (!) 155/98  Pulse:  (!) 110 (!) 110 74  Resp:  17  17  Temp:  98.8 F (37.1 C)  98.7 F (37.1 C)  TempSrc:    Oral  SpO2: 95% (!) 87% 91% 92%  Weight:      Height:       General exam: Appears calm and comfortable  Respiratory system: Clear to auscultation. Respiratory effort normal. Cardiovascular system: S1 & S2 heard, RRR. No JVD, murmurs, rubs, gallops or clicks. No pedal edema. Gastrointestinal system: Abdomen is nondistended, soft and nontender. No organomegaly or masses felt. Normal bowel sounds heard. Central nervous system: Alert and oriented. No focal neurological deficits. Extremities: Symmetric 5 x 5 power. Skin: No rashes, lesions or ulcers Psychiatry: Judgement and insight appear normal. Mood & affect appropriate.    Data Reviewed:  Lab results reviewed  Family Communication: None  Disposition: Status is: Inpatient Remains inpatient appropriate because: Severity of disease, unsafe discharge     Time spent: 35 minutes  Author: Murvin Mana, MD 09/21/2024 11:42 AM  For on call review [email or web address].

## 2024-09-21 NOTE — Progress Notes (Addendum)
 Physical Therapy Treatment Patient Details Name: Jean Gomez MRN: [id number] DOB: 1956/02/19 Today's Date: 09/21/2024   History of Present Illness Jean Gomez is a 68 y.o. female with medical history significant for copd, on o2, seizures, chronic pain, polypharmacy, presents after fall.    PT Comments  Pt recently returned to bed from sitting in chair but ready to walk.  Bed mobility has improved with rail and light HOB raised.  No physical assist needed today. Steady in sitting with vitals WFL on 2 lpm support.  She does not want to use RW for support and self selects gait distance of 36' with intermittent rail support.  No LOB or buckling.  78% O2 and 141 HR upon return to room on 2 lpm.  She is briefly increased to 3 LPM due to slow recovery and is left at baseline in supine with needs met and back to 2 lpm.  Pt remains motivated to mobilize.  Education provided regarding body awareness of breathing and fatigue level.  She does admit to overdoing it.  Pt may need increased O2 support for mobility.  Of note, she does have spray in room and a gel type air freshener.  Pt stated sprays and scents do not bother her I like it.  Education provided regarding air fresheners and COPD.  Pt is progressing with overall mobility but O2 support and HR remains affected.  Pt and family wish to continue with SNF if available to them.     If plan is discharge home, recommend the following: A little help with walking and/or transfers;A little help with bathing/dressing/bathroom;Assist for transportation;Assistance with cooking/housework;Help with stairs or ramp for entrance   Can travel by private vehicle        Equipment Recommendations       Recommendations for Other Services       Precautions / Restrictions Precautions Precautions: Fall Recall of Precautions/Restrictions: Intact Restrictions Weight Bearing Restrictions Per Provider Order: No     Mobility  Bed Mobility Overal bed  mobility: Needs Assistance Bed Mobility: Supine to Sit, Sit to Supine     Supine to sit: Supervision, HOB elevated, Used rails Sit to supine: Supervision, HOB elevated, Used rails     Patient Response: Cooperative  Transfers Overall transfer level: Needs assistance Equipment used: 1 person hand held assist Transfers: Sit to/from Stand Sit to Stand: Contact guard assist                Ambulation/Gait Ambulation/Gait assistance: Contact guard assist, Supervision Gait Distance (Feet): 50 Feet Assistive device: 1 person hand held assist, None Gait Pattern/deviations: Step-through pattern, Decreased step length - right, Decreased step length - left Gait velocity: dec     General Gait Details: walks in hallway with intermittent rail support   Stairs             Wheelchair Mobility     Tilt Bed Tilt Bed Patient Response: Cooperative  Modified Rankin (Stroke Patients Only)       Balance Overall balance assessment: Mild deficits observed, not formally tested Sitting-balance support: Feet supported Sitting balance-Leahy Scale: Good     Standing balance support: Single extremity supported Standing balance-Leahy Scale: Good                              Communication Communication Communication: No apparent difficulties  Cognition Arousal: Alert Behavior During Therapy: WFL for tasks assessed/performed   PT - Cognitive impairments: No  apparent impairments                         Following commands: Intact      Cueing Cueing Techniques: Verbal cues  Exercises      General Comments        Pertinent Vitals/Pain Pain Assessment Pain Assessment: Faces Faces Pain Scale: Hurts a little bit Pain Location: R side rib area Pain Descriptors / Indicators: Sore Pain Intervention(s): Limited activity within patient's tolerance, Monitored during session, Repositioned    Home Living                          Prior  Function            PT Goals (current goals can now be found in the care plan section) Progress towards PT goals: Progressing toward goals    Frequency    Min 2X/week      PT Plan      Co-evaluation              AM-PAC PT 6 Clicks Mobility   Outcome Measure  Help needed turning from your back to your side while in a flat bed without using bedrails?: A Little Help needed moving from lying on your back to sitting on the side of a flat bed without using bedrails?: A Little Help needed moving to and from a bed to a chair (including a wheelchair)?: None Help needed standing up from a chair using your arms (e.g., wheelchair or bedside chair)?: None Help needed to walk in hospital room?: A Little Help needed climbing 3-5 steps with a railing? : A Little 6 Click Score: 20    End of Session Equipment Utilized During Treatment: Oxygen  Activity Tolerance: Patient tolerated treatment well Patient left: in bed;with call bell/phone within reach;with bed alarm set Nurse Communication: Mobility status PT Visit Diagnosis: Unsteadiness on feet (R26.81);Other abnormalities of gait and mobility (R26.89);Difficulty in walking, not elsewhere classified (R26.2);Muscle weakness (generalized) (M62.81)     Time: 8978-8966 PT Time Calculation (min) (ACUTE ONLY): 12 min  Charges:    $Gait Training: 8-22 mins PT General Charges $$ ACUTE PT VISIT: 1 Visit                    Lauraine Gills, PTA 09/21/24, 10:47 AM

## 2024-09-21 NOTE — TOC Progression Note (Signed)
 Transition of Care [facility]) - Progression Note    Patient Details  Name: Jean Gomez MRN: [id number] Date of Birth: Jan 29, 1956  [facility]) CM/SW Contact  Marinda Cooks, RN Phone Number: 09/21/2024, 6:04 PM  Clinical Narrative:    Per chart review pt remains medically unstable to dc at this time. TOC will cont to follow dc planning / care coordination and update as applicable.                      Expected Discharge Plan and Services                                               Social Drivers of Health (SDOH) Interventions SDOH Screenings   Food Insecurity: No Food Insecurity (09/13/2024)  Housing: Low Risk  (09/13/2024)  Transportation Needs: No Transportation Needs (09/13/2024)  Utilities: Not At Risk (09/13/2024)  Alcohol Screen: Low Risk  (04/17/2018)  Depression (PHQ2-9): High Risk (07/28/2024)  Financial Resource Strain: Low Risk  (07/29/2024)   Received from [facility]  Physical Activity: Unknown (03/05/2018)  Social Connections: Unknown (09/13/2024)  Stress: No Stress Concern Present (03/05/2018)  Tobacco Use: Low Risk  (09/13/2024)    Readmission Risk Interventions     No data to display

## 2024-09-21 NOTE — Plan of Care (Signed)
   Problem: Education: Goal: Knowledge of General Education information will improve Description Including pain rating scale, medication(s)/side effects and non-pharmacologic comfort measures Outcome: Progressing

## 2024-09-22 NOTE — Progress Notes (Signed)
 Mobility Specialist Progress Note:    09/22/24 0917  Mobility  Activity Ambulated with assistance;Pivoted/transferred from bed to chair  Level of Assistance Contact guard assist, steadying assist  Assistive Device None  Distance Ambulated (ft) 35 ft  Range of Motion/Exercises Active;All extremities  Activity Response Tolerated well  Mobility visit 1 Mobility  Mobility Specialist Start Time (ACUTE ONLY) 0857  Mobility Specialist Stop Time (ACUTE ONLY) [id number]  Mobility Specialist Time Calculation (min) (ACUTE ONLY) 19 min   Pt received in bed, agreeable to mobility. Required CGA to stand and ambulate with 1 person hand-held assist. Tolerated well, see O2 sats below. HR in the 140's during ambulation, recovered to 102 bpm after session. Audible SOB and fatigue. Returned to room, left in chair. Alarm on and belongings in reach, all needs met. Nurse aware and agrees of O2 sats and LPM changes for recovery.   SpO2 91% on 2L at rest SpO2 78% on 2L during ambulation SpO2 94% on 3L immediately after ambulation  SpO2 93% on 2L at rest, after ambulation     Sherrilee Ditty Mobility Specialist Please contact via SecureChat or  Rehab office at [phone]

## 2024-09-22 NOTE — Plan of Care (Signed)
  Problem: Clinical Measurements: Goal: Will remain free from infection Outcome: Progressing Goal: Diagnostic test results will improve Outcome: Progressing   Problem: Activity: Goal: Risk for activity intolerance will decrease Outcome: Progressing   Problem: Nutrition: Goal: Adequate nutrition will be maintained Outcome: Progressing

## 2024-09-22 NOTE — TOC Progression Note (Signed)
 Transition of Care [facility]) - Progression Note    Patient Details  Name: Jean Gomez MRN: [id number] Date of Birth: January 18, 1956  Transition of Care [facility]) CM/SW Contact  Alvaro Louder, [place] Phone Number: 09/22/2024, 4:30 PM  Clinical Narrative:   Shara approved for patient to admit to [facility]. Approved PlanAuthID:[id number] Dates: 10/6-10/07/2024 Next Review Date:09/24/2024   TOC to follow for discharge                      Expected Discharge Plan and Services                                               Social Drivers of Health (SDOH) Interventions SDOH Screenings   Food Insecurity: No Food Insecurity (09/13/2024)  Housing: Low Risk  (09/13/2024)  Transportation Needs: No Transportation Needs (09/13/2024)  Utilities: Not At Risk (09/13/2024)  Alcohol Screen: Low Risk  (04/17/2018)  Depression (PHQ2-9): High Risk (07/28/2024)  Financial Resource Strain: Low Risk  (07/29/2024)   Received from [facility]  Physical Activity: Unknown (03/05/2018)  Social Connections: Unknown (09/13/2024)  Stress: No Stress Concern Present (03/05/2018)  Tobacco Use: Low Risk  (09/13/2024)    Readmission Risk Interventions     No data to display

## 2024-09-22 NOTE — Plan of Care (Signed)

## 2024-09-22 NOTE — Progress Notes (Signed)
  Progress Note   Patient: Jean Gomez FMW:[id number] DOB: 10/26/1956 DOA: 09/13/2024     7 DOS: the patient was seen and examined on 09/22/2024   Brief hospital course:  BEANCA KIESTER is a 68 y.o. female with medical history significant for copd, on o2, seizures, chronic pain, polypharmacy, presents after fall. [place] lost her grip and fell striking right side of chest and right side of head.  Chest x-ray showed a right 3-7 and a left second rib fractures.  Patient was given pain medicine. Patient was also having acute on chronic anemia, but no evidence of bleeding. Pain is treated with pain medicine.  She is also required 2 L oxygen  appear to be chronic in nature   Principal Problem:   Rib fractures Active Problems:   Chronic pain syndrome   Chronic musculoskeletal pain   COPD (chronic obstructive pulmonary disease) (HCC)   Chronic respiratory failure with hypoxia (HCC)   Epilepsy (HCC)   Obstructive sleep apnea on CPAP   Sarcoidosis   Bipolar affective disorder, current episode manic (HCC)   Essential hypertension   Dementia, presenile with depression (HCC)   Acute on chronic anemia   Assessment and Plan: # Rib fractures Right 3-7 and left 2nd after fall. Still requiring IV Dilaudid  and as needed Percocet.  Pain seems to be better, discontinued IV Dilaudid , continue oral Percocet.  Started incentive spirometer. Patient and family still concerning for an adequate pain control.  I have changed OxyContin  10 mg every 12 hours plus hydrocodone  5 mg every 6 hours as needed.  Pain better. Notified TOC, patient is medically stable for discharge.   # Acute on chronic microcytic anemia Iron deficient anemia. Reactive thrombocytosis. Recent iron studies showed significant iron deficiency without B12 deficiency. Received IV iron on 10/1, hemoglobin dropped down to 6.7, received 1 unit PRBC 10/2.  Still stable   # Hypokalemia #  Hypomagnesemia Acute kidney injury. Condition all improved.     # COPD # Chronic hypoxic respiratory failure Condition stable.   Constipation. Condition resolved.         Subjective:  Still has some chest pain, required pain medicine  Physical Exam: Vitals:   09/21/24 2006 09/21/24 2040 09/22/24 0440 09/22/24 0730  BP: (!) 149/92  (!) 150/97 (!) 142/92  Pulse: (!) 102  (!) 102 (!) 110  Resp: 17  17 17   Temp: 98.6 F (37 C)   98.2 F (36.8 C)  TempSrc: Oral     SpO2: 97% 97% 97% 95%  Weight:      Height:       General exam: Appears calm and comfortable  Respiratory system: Clear to auscultation. Respiratory effort normal. Cardiovascular system: S1 & S2 heard, RRR. No JVD, murmurs, rubs, gallops or clicks. No pedal edema. Gastrointestinal system: Abdomen is nondistended, soft and nontender. No organomegaly or masses felt. Normal bowel sounds heard. Central nervous system: Alert and oriented. No focal neurological deficits. Extremities: Symmetric 5 x 5 power. Skin: No rashes, lesions or ulcers Psychiatry: Judgement and insight appear normal. Mood & affect appropriate.    Data Reviewed:  There are no new results to review at this time.  Family Communication: None  Disposition: Status is: Inpatient Remains inpatient appropriate because: Unsafe discharge pending nursing home placement     Time spent: 25 minutes  Author: Murvin Mana, MD 09/22/2024 12:18 PM  For on call review [email or web address].

## 2024-09-22 NOTE — TOC CM/SW Note (Signed)
 Transition of Care (TOC) CM/SW Note   To Whom It May Concern:   Please be advised that the above-named patient will require a short-term nursing home stay - anticipated 30 days or less for rehabilitation and strengthening.  The plan is for return home

## 2024-09-23 MED ORDER — OXYCODONE HCL 10 MG PO TABS: 10.0000 mg | ORAL_TABLET | Freq: Three times a day (TID) | ORAL | 0 refills | Status: AC | PRN

## 2024-09-23 MED ORDER — METOPROLOL TARTRATE 25 MG PO TABS: 25.0000 mg | ORAL_TABLET | Freq: Two times a day (BID) | ORAL | Status: AC

## 2024-09-23 NOTE — TOC Transition Note (Signed)
 Transition of Care [facility]) - Discharge Note   Patient Details  Name: Jean Gomez MRN: [id number] Date of Birth: 08-25-1956  Transition of Care [facility]) CM/SW Contact:  Alvaro Louder, LCSW Phone Number: 09/23/2024, 10:14 AM   Clinical Narrative:   LCSWA received insurance approval for patient to admit to SNF [facility]. LCSWA confirmed with MD that patient is stable for discharge. LCSWA notified the patient and they are in agreement with discharge. LCSWA confirmed bed is available at SNF. Transport arranged with [facility] for next available.  RM [phone], Number to call report [phone]     Final next level of care: Skilled Nursing Facility Barriers to Discharge: No Barriers Identified   Patient Goals and CMS Choice            Discharge Placement              Patient chooses bed at: [facility] Patient to be transferred to facility by: [facility] Name of family member notified: Tammi Patient and family notified of of transfer: 09/23/24  Discharge Plan and Services Additional resources added to the After Visit Summary for                                       Social Drivers of Health (SDOH) Interventions SDOH Screenings   Food Insecurity: No Food Insecurity (09/13/2024)  Housing: Low Risk  (09/13/2024)  Transportation Needs: No Transportation Needs (09/13/2024)  Utilities: Not At Risk (09/13/2024)  Alcohol Screen: Low Risk  (04/17/2018)  Depression (PHQ2-9): High Risk (07/28/2024)  Financial Resource Strain: Low Risk  (07/29/2024)   Received from [facility]  Physical Activity: Unknown (03/05/2018)  Social Connections: Unknown (09/13/2024)  Stress: No Stress Concern Present (03/05/2018)  Tobacco Use: Low Risk  (09/13/2024)     Readmission Risk Interventions     No data to display

## 2024-09-23 NOTE — Progress Notes (Signed)
 Mobility Specialist Progress Note:    09/23/24 0852  Mobility  Activity Ambulated with assistance;Pivoted/transferred from bed to chair  Level of Assistance Contact guard assist, steadying assist  Assistive Device None  Distance Ambulated (ft) 35 ft  Range of Motion/Exercises Active;All extremities  Activity Response Tolerated well  Mobility visit 1 Mobility  Mobility Specialist Start Time (ACUTE ONLY) 0825  Mobility Specialist Stop Time (ACUTE ONLY) [id number]  Mobility Specialist Time Calculation (min) (ACUTE ONLY) 17 min   Pt received in chair, SpO2 85% on 1L via [place] when this mobility specialist arrived. Placed pt on 3L per MD as 2-3L is baseline. Pt required CGA to stand and ambulate with RW. Tolerated well, see O2 sats below. HR in the high 130's throughout session and recovered to 116 bpm after rest. Left pt in chair, nursing made aware. All needs met.  SpO2 92% on 3L at rest SpO2 85% on 3L during ambulation; required 4L for sats to stay >88% during ambulation SpO2 93% on 2L after rest and recovery   Sherrilee Ditty Mobility Specialist Please contact via SecureChat or  Rehab office at [phone]

## 2024-09-23 NOTE — Progress Notes (Signed)
 Nurse called [facility] and spoke with [employer]. Report given.

## 2024-09-23 NOTE — Discharge Summary (Addendum)
 Physician Discharge Summary   Patient: Jean Gomez MRN: [id number] DOB: 1956-03-04  Admit date:     09/13/2024  Discharge date: 09/23/24  Discharge Physician: Murvin Mana   PCP: Pcp, No   Recommendations at discharge:   Follow-up with PCP in SNF.  Discharge Diagnoses: Principal Problem:   Rib fractures Active Problems:   Chronic pain syndrome   Chronic musculoskeletal pain   COPD (chronic obstructive pulmonary disease) (HCC)   Chronic respiratory failure with hypoxia (HCC)   Epilepsy (HCC)   Obstructive sleep apnea on CPAP   Sarcoidosis   Bipolar affective disorder, current episode manic (HCC)   Essential hypertension   Dementia, presenile with depression (HCC)   Acute on chronic anemia  Resolved Problems:   * No resolved hospital problems. *  Hospital Course:  Jean Gomez is a 68 y.o. female with medical history significant for copd, on o2, seizures, chronic pain, polypharmacy, presents after fall. [place] when lost her grip and fell striking right side of chest and right side of head.  Chest x-ray showed a right 3-7 and a left second rib fractures.  Patient was given pain medicine. Patient was also having acute on chronic anemia, but no evidence of bleeding. Pain is treated with pain medicine.  She is also required 2 L oxygen  appear to be chronic in nature. Additionally improved, patient had insurance approved for placement.  Will discharge today.  Assessment and Plan: # Rib fractures Right 3-7 and left 2nd after fall. Still requiring IV Dilaudid  and as needed Percocet.  Pain seems to be better, discontinued IV Dilaudid , continue oral Percocet.  Started incentive spirometer. Patient and family still concerning for an adequate pain control.  I have changed OxyContin  10 mg every 12 hours plus hydrocodone  5 mg every 6 hours as Patient is medically stable for discharge.  Will continue home dose pain medicine.   # Acute on chronic  microcytic anemia Iron deficient anemia. Reactive thrombocytosis. Recent iron studies showed significant iron deficiency without B12 deficiency. Received IV iron on 10/1, hemoglobin dropped down to 6.7, received 1 unit PRBC 10/2.  Hb improved.  No active bleeding.   # Hypokalemia # Hypomagnesemia Acute kidney injury. Condition all improved.     # COPD # Chronic hypoxic respiratory failure Condition stable.  She was chronically on 3 L oxygen , currently with stable oxygen  saturation on 2 L.  She never used CPAP.   Constipation. Condition resolved.          Consultants: None Procedures performed: None  Disposition: Skilled nursing facility Diet recommendation:  Discharge Diet Orders (From admission, onward)     Start     Ordered   09/23/24 0000  Diet - low sodium heart healthy        09/23/24 0929           Cardiac diet DISCHARGE MEDICATION: Allergies as of 09/23/2024       Reactions   Penicillins Rash, Hives, Itching   Contrast Media [iodinated Contrast Media] Swelling   Gabapentin    Labetalol Other (See Comments)   Made hair fall out   Lactose Intolerance (gi)    Bloating and diarrhea    Sulfabenzamide Nausea Only   Pregabalin  Other (See Comments)   The patient complained of hemifacial numbness with the use of the medication.        Medication List     STOP taking these medications    amLODipine -olmesartan  5-40 MG tablet Commonly known  as: AZOR    hydrochlorothiazide  25 MG tablet Commonly known as: HYDRODIURIL    indomethacin  50 MG capsule Commonly known as: INDOCIN        TAKE these medications    Albuterol  Sulfate 108 (90 Base) MCG/ACT Aepb Commonly known as: PROAIR  RESPICLICK Inhale 2 puffs into the lungs every 6 (six) hours as needed.   albuterol  (2.5 MG/3ML) 0.083% nebulizer solution Commonly known as: PROVENTIL  Take 2.5 mg by nebulization every 6 (six) hours as needed for wheezing or shortness of breath.   amitriptyline 50 MG  tablet Commonly known as: ELAVIL Take 50 mg by mouth at bedtime.   Arnuity Ellipta  100 MCG/ACT Aepb Generic drug: Fluticasone  Furoate TAKE 1 INHALATION BY MOUTH ONCE DAILY   aspirin  EC 81 MG tablet Take 1 tablet (81 mg total) by mouth daily.   atorvastatin  80 MG tablet Commonly known as: LIPITOR Take 1 tablet (80 mg total) by mouth at bedtime.   Belsomra  20 MG Tabs Generic drug: Suvorexant  Take 1 tablet (20 mg total) by mouth at bedtime.   busPIRone  15 MG tablet Commonly known as: BUSPAR  Take 1 tablet (15 mg total) by mouth 2 (two) times daily.   calcium  carbonate 1250 (500 Ca) MG chewable tablet Commonly known as: OS-CAL Chew 1 tablet by mouth daily.   citalopram  40 MG tablet Commonly known as: CELEXA  Take 1 tablet (40 mg total) by mouth every morning.   diphenhydrAMINE  25 MG tablet Commonly known as: BENADRYL  Take 25 mg by mouth every 6 (six) hours as needed.   donepezil  10 MG tablet Commonly known as: ARICEPT  Take 1 tablet (10 mg total) by mouth every morning.   fluticasone  50 MCG/ACT nasal spray Commonly known as: FLONASE  Place 1 spray into both nostrils daily.   lactulose 10 GM/15ML solution Commonly known as: CHRONULAC TAKE (3 TEASPOONFULS) BY MOUTH TWICE DAILY AS NEEDED   levETIRAcetam  750 MG tablet Commonly known as: KEPPRA  TAKE 1 TABLET BY MOUTH EVERY MORNING   linaclotide  290 MCG Caps capsule Commonly known as: Linzess  Take 1 capsule (290 mcg total) by mouth daily before breakfast.   Medical Compression Stockings Misc Please provide compression stockings   metoprolol tartrate 25 MG tablet Commonly known as: LOPRESSOR Take 1 tablet (25 mg total) by mouth 2 (two) times daily.   mirtazapine  15 MG tablet Commonly known as: REMERON  TAKE ONE TABLET BY MOUTH AT BEDTIME   nitroGLYCERIN  0.4 MG SL tablet Commonly known as: NITROSTAT  Place 0.4 mg under the tongue every 5 (five) minutes as needed for chest pain.   Oxycodone  HCl 10 MG  Tabs Take 1 tablet (10 mg total) by mouth 3 (three) times daily as needed.   OXYGEN  Inhale 2 L/min into the lungs continuous.   pantoprazole  40 MG tablet Commonly known as: PROTONIX  Take 1 tablet (40 mg total) by mouth 2 (two) times daily.   sucralfate  1 g tablet Commonly known as: CARAFATE  Take 1 tablet (1 g total) by mouth 4 (four) times daily.   tiZANidine  4 MG tablet Commonly known as: ZANAFLEX  Take 1 tablet (4 mg total) by mouth 3 (three) times daily. What changed:  when to take this reasons to take this        Discharge Exam: Filed Weights   09/13/24 0651  Weight: 80.3 kg   General exam: Appears calm and comfortable  Respiratory system: Clear to auscultation. Respiratory effort normal. Cardiovascular system: S1 & S2 heard, RRR. No JVD, murmurs, rubs, gallops or clicks. No pedal edema. Gastrointestinal system: Abdomen  is nondistended, soft and nontender. No organomegaly or masses felt. Normal bowel sounds heard. Central nervous system: Alert and oriented. No focal neurological deficits. Extremities: Symmetric 5 x 5 power. Skin: No rashes, lesions or ulcers Psychiatry: Judgement and insight appear normal. Mood & affect appropriate.    Condition at discharge: good  The results of significant diagnostics from this hospitalization (including imaging, microbiology, ancillary and laboratory) are listed below for reference.   Imaging Studies: DG Knee Complete 4 Views Right Result Date: 09/13/2024 EXAM: 4 VIEW(S) XRAY OF THE RIGHT KNEE 09/13/2024 07:27:00 AM COMPARISON: None available. CLINICAL HISTORY: fall. Right knee pain after a fall while trying to get into car 1 day ago FINDINGS: BONES AND JOINTS: No acute fracture. No focal osseous lesion. No joint dislocation. Small bursal effusion. Marginal spurring with mild joint space loss. 1.9 x 1.7 x 1.7 cm well corticated intra-articular body posteriorly in the joint space. SOFT TISSUES: Femoral arterial calcifications.  IMPRESSION: 1. Small bursal effusion. 2. Well-corticated 1.9 cm intra-articular body in the posterior joint space. 3. No acute fracture or dislocation. Electronically signed by: Waddell Calk MD 09/13/2024 07:31 AM EDT RP Workstation: [id number]   CT CHEST ABDOMEN PELVIS WO CONTRAST Result Date: 09/13/2024 EXAM: CT CHEST, ABDOMEN AND PELVIS WITHOUT CONTRAST 09/13/2024 06:40:18 AM TECHNIQUE: CT of the chest, abdomen and pelvis was performed without the administration of intravenous contrast. Multiplanar reformatted images are provided for review. Automated exposure control, iterative reconstruction, and/or weight based adjustment of the mA/kV was utilized to reduce the radiation dose to as low as reasonably achievable. COMPARISON: CT abdomen and pelvis 10/20/2023. CLINICAL HISTORY: Polytrauma, blunt. Table formatting from the original note was not included.; Images from the original note were not included.; Per ed notes; BIB family POV after patient fell last night around 2030 and is now C/O right rib pain and pain under right breast that hurts worse with movement or deep breaths. + head strike, + LOC. Not on blood thinners FINDINGS: CHEST: MEDIASTINUM AND LYMPH NODES: Normal heart size. Increased caliber of the main pulmonary artery measures 3.5 cm. Aortic atherosclerosis. Coronary artery calcifications. The central airways are clear. No mediastinal, hilar or axillary lymphadenopathy. LUNGS AND PLEURA: Bilateral areas of bronchiectasis, scarring and volume loss identified. Scar versus subsegmental atelectasis in the right lung base. No focal consolidation or pulmonary edema. No pneumothorax, pleural effusions or signs of pulmonary contusion. ABDOMEN AND PELVIS: LIVER: The liver is unremarkable. GALLBLADDER AND BILE DUCTS: Status post cholecystectomy. Caliber of the common bile duct measures 8 mm. This is similar to the previous exam. In the absence of signs or symptoms of biliary obstruction this is favored to  represent post cholecystectomy physiology. 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 Spleen appears normal. PANCREAS: No acute abnormality. ADRENAL GLANDS: No acute abnormality. KIDNEYS, URETERS AND BLADDER: No stones in the kidneys or ureters. No hydronephrosis. No perinephric or periureteral stranding. Urinary bladder is unremarkable. GI AND BOWEL: Stomach demonstrates no acute abnormality. No dilated loops of large or small bowel. There is no pathologic dilatation of the bowel. Moderate stool burden is identified within the cecum and ascending colon. There is no bowel obstruction. REPRODUCTIVE ORGANS: The uterus is surgically absent. No adnexal mass. PERITONEUM AND RETROPERITONEUM: No free fluid or fluid collections. No free air. VASCULATURE: Aorta is normal in caliber. Aortic atherosclerosis. ABDOMINAL AND PELVIS LYMPH NODES: No lymphadenopathy. BONES AND SOFT TISSUES: Multiple, acute right anterior lateral rib fractures including the right third, fourth, fifth, sixth, and seventh ribs.  There is also a fracture involving the anterior aspect of the left second rib. Mild age-indeterminate superior endplate deformity involves the T4 vertebra without signs of retropulsion of fracture fragments, image 59 of the sagittal series. Lumbar vertebral body heights are all well maintained. First-degree anterolisthesis is identified at L5 on S1. No focal soft tissue abnormality. IMPRESSION: 1. Multiple acute right anterolateral rib fractures involving the right third through seventh ribs, and an acute fracture of the anterior left second rib. 2. Mild age-indeterminate compression deformity involving the T4 vertebra. 3. No pneumothorax, pleural effusion, or pulmonary contusion detected. 4. No signs of solid organ injury within the abdomen and pelvis. No free fluid noted. Electronically signed by: Waddell Calk MD 09/13/2024 07:00 AM EDT RP Workstation: [id number]   CT Cervical Spine Wo  Contrast Result Date: 09/13/2024 CLINICAL DATA:  68 year old female status post fall last night at 2030 hours, right side pain, struck head, positive loss of consciousness. EXAM: CT CERVICAL SPINE WITHOUT CONTRAST TECHNIQUE: Multidetector CT imaging of the cervical spine was performed without intravenous contrast. Multiplanar CT image reconstructions were also generated. RADIATION DOSE REDUCTION: This exam was performed according to the departmental dose-optimization program which includes automated exposure control, adjustment of the mA and/or kV according to patient size and/or use of iterative reconstruction technique. COMPARISON:  Head CT today.  Cervical spine CT 08/22/2020. FINDINGS: Alignment: Stable straightening of cervical lordosis and chronic degenerative anterolisthesis of C4 on C5. Cervicothoracic junction alignment is within normal limits. Bilateral posterior element alignment is within normal limits. Skull base and vertebrae: Visualized skull base is intact. No atlanto-occipital dissociation. Congenital incomplete ossification of the posterior C1 ring, normal variant. Intermittent mild motion artifact. C1 and C2 appear intact and aligned. No acute osseous abnormality identified. Soft tissues and spinal canal: No prevertebral fluid or swelling. No visible canal hematoma. Retropharyngeal cervical carotids. Motion artifact in the bilateral neck. Disc levels: Stable chronic cervical spine degeneration, predominantly bilateral facet arthropathy. Chronic degenerative anterolisthesis of C4 on C5. Moderate to advanced chronic disc and endplate degeneration at C5-C6. 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 Motion artifact. Chest CT today reported separately. IMPRESSION: Mild motion artifact. No acute traumatic injury identified in the cervical spine. Electronically Signed   By: VEAR Hurst M.D.   On: 09/13/2024 06:50   CT Head Wo Contrast Result Date:  09/13/2024 CLINICAL DATA:  68 year old female status post fall last night at 2030 hours, right side pain, struck head, positive loss of consciousness. EXAM: CT HEAD WITHOUT CONTRAST TECHNIQUE: Contiguous axial images were obtained from the base of the skull through the vertex without intravenous contrast. RADIATION DOSE REDUCTION: This exam was performed according to the departmental dose-optimization program which includes automated exposure control, adjustment of the mA and/or kV according to patient size and/or use of iterative reconstruction technique. COMPARISON:  Head CT 08/22/2020. FINDINGS: Brain: Cerebral volume is stable, within normal limits for age. No midline shift, mass effect, or evidence of intracranial mass lesion. No ventriculomegaly. No acute intracranial hemorrhage identified. Chronic basal ganglia vascular calcifications. Patchy hypodensity scattered in the bilateral cerebral white matter and bilateral basal ganglia is stable, moderate for age. No cortically based acute infarct identified. Vascular: No suspicious intracranial vascular hyperdensity. Advanced Calcified atherosclerosis at the skull base. Skull: Stable and intact. Congenital incomplete ossification of the posterior C1 ring, normal variant. Sinuses/Orbits: Similar low-density fluid and/or mucosal thickening in the right paranasal sinuses, regressed in the right sphenoid sinus rim last month. Tympanic cavities and mastoids  well aerated. Other: Asymmetric mild forehead soft tissue swelling and stranding now greater on the right (series 3, image 32). Underlying frontal bones and frontal sinuses appear intact. No scalp soft tissue gas. Stable orbits soft tissues. IMPRESSION: 1. Mild right forehead soft tissue injury. No skull fracture. 2. No acute intracranial abnormality. Stable non contrast CT appearance of chronic small vessel disease. Electronically Signed   By: VEAR Hurst M.D.   On: 09/13/2024 06:47    Microbiology: Results for  orders placed or performed in visit on 10/30/23  Clostridium difficile EIA     Status: None   Collection Time: 10/31/23 10:19 AM   Specimen: Stool   ST  Result Value Ref Range Status   C difficile Toxins A+B, EIA Negative Negative Final    Labs: CBC: Recent Labs  Lab 09/17/24 0705 09/18/24 0319 09/19/24 0743 09/21/24 0425  WBC 10.4 11.0* 11.9* 13.3*  HGB 7.0* 6.7* 9.5* 9.2*  HCT 23.7* 23.2* 32.0* 30.3*  MCV 76.5* 76.6* 75.5* 76.1*  PLT 520* 499* 537* 444*   Basic Metabolic Panel: Recent Labs  Lab 09/18/24 0319  NA 137  K 3.7  CL 101  CO2 28  GLUCOSE 86  BUN 6*  CREATININE 0.42*  CALCIUM  8.3*  MG 1.8  PHOS 3.2   Liver Function Tests: No results for input(s): AST, ALT, ALKPHOS, BILITOT, PROT, ALBUMIN  in the last 168 hours. CBG: Recent Labs  Lab 09/16/24 1647  GLUCAP 151*    Discharge time spent: 35 minutes.  Signed: Murvin Mana, MD [facility] 09/23/2024

## 2024-09-24 DIAGNOSIS — N179 Acute kidney failure, unspecified: Secondary | ICD-10-CM | POA: Diagnosis not present

## 2024-09-24 DIAGNOSIS — I10 Essential (primary) hypertension: Secondary | ICD-10-CM | POA: Diagnosis not present

## 2024-09-24 DIAGNOSIS — S2243XA Multiple fractures of ribs, bilateral, initial encounter for closed fracture: Secondary | ICD-10-CM | POA: Diagnosis not present

## 2024-09-24 DIAGNOSIS — E876 Hypokalemia: Secondary | ICD-10-CM | POA: Diagnosis not present

## 2024-09-24 DIAGNOSIS — K5900 Constipation, unspecified: Secondary | ICD-10-CM | POA: Diagnosis not present

## 2024-09-24 DIAGNOSIS — J441 Chronic obstructive pulmonary disease with (acute) exacerbation: Secondary | ICD-10-CM | POA: Diagnosis not present

## 2024-09-24 DIAGNOSIS — D62 Acute posthemorrhagic anemia: Secondary | ICD-10-CM | POA: Diagnosis not present

## 2024-09-24 DIAGNOSIS — R197 Diarrhea, unspecified: Secondary | ICD-10-CM | POA: Diagnosis not present

## 2024-09-24 DIAGNOSIS — Z9181 History of falling: Secondary | ICD-10-CM | POA: Diagnosis not present

## 2024-09-26 DIAGNOSIS — S2243XA Multiple fractures of ribs, bilateral, initial encounter for closed fracture: Secondary | ICD-10-CM | POA: Diagnosis not present

## 2024-09-26 DIAGNOSIS — Z9181 History of falling: Secondary | ICD-10-CM | POA: Diagnosis not present

## 2024-09-26 DIAGNOSIS — D62 Acute posthemorrhagic anemia: Secondary | ICD-10-CM | POA: Diagnosis not present

## 2024-09-26 DIAGNOSIS — J449 Chronic obstructive pulmonary disease, unspecified: Secondary | ICD-10-CM | POA: Diagnosis not present

## 2024-09-26 DIAGNOSIS — G8929 Other chronic pain: Secondary | ICD-10-CM | POA: Diagnosis not present

## 2024-09-26 DIAGNOSIS — Z9981 Dependence on supplemental oxygen: Secondary | ICD-10-CM | POA: Diagnosis not present

## 2024-09-26 DIAGNOSIS — E876 Hypokalemia: Secondary | ICD-10-CM | POA: Diagnosis not present

## 2024-09-26 DIAGNOSIS — S2232XD Fracture of one rib, left side, subsequent encounter for fracture with routine healing: Secondary | ICD-10-CM | POA: Diagnosis not present

## 2024-09-26 DIAGNOSIS — I10 Essential (primary) hypertension: Secondary | ICD-10-CM | POA: Diagnosis not present

## 2024-09-26 DIAGNOSIS — N179 Acute kidney failure, unspecified: Secondary | ICD-10-CM | POA: Diagnosis not present

## 2024-09-26 DIAGNOSIS — J441 Chronic obstructive pulmonary disease with (acute) exacerbation: Secondary | ICD-10-CM | POA: Diagnosis not present

## 2024-10-01 DIAGNOSIS — S2232XD Fracture of one rib, left side, subsequent encounter for fracture with routine healing: Secondary | ICD-10-CM | POA: Diagnosis not present

## 2024-10-01 DIAGNOSIS — Z9181 History of falling: Secondary | ICD-10-CM | POA: Diagnosis not present

## 2024-10-01 DIAGNOSIS — I10 Essential (primary) hypertension: Secondary | ICD-10-CM | POA: Diagnosis not present

## 2024-10-01 DIAGNOSIS — G8929 Other chronic pain: Secondary | ICD-10-CM | POA: Diagnosis not present

## 2024-10-01 DIAGNOSIS — R600 Localized edema: Secondary | ICD-10-CM | POA: Diagnosis not present

## 2024-10-01 DIAGNOSIS — D62 Acute posthemorrhagic anemia: Secondary | ICD-10-CM | POA: Diagnosis not present

## 2024-10-01 DIAGNOSIS — Z9981 Dependence on supplemental oxygen: Secondary | ICD-10-CM | POA: Diagnosis not present

## 2024-10-01 DIAGNOSIS — S2243XA Multiple fractures of ribs, bilateral, initial encounter for closed fracture: Secondary | ICD-10-CM | POA: Diagnosis not present

## 2024-10-01 DIAGNOSIS — J9611 Chronic respiratory failure with hypoxia: Secondary | ICD-10-CM | POA: Diagnosis not present

## 2024-10-01 DIAGNOSIS — J441 Chronic obstructive pulmonary disease with (acute) exacerbation: Secondary | ICD-10-CM | POA: Diagnosis not present

## 2024-10-03 DIAGNOSIS — Z9181 History of falling: Secondary | ICD-10-CM | POA: Diagnosis not present

## 2024-10-03 DIAGNOSIS — R600 Localized edema: Secondary | ICD-10-CM | POA: Diagnosis not present

## 2024-10-03 DIAGNOSIS — I10 Essential (primary) hypertension: Secondary | ICD-10-CM | POA: Diagnosis not present

## 2024-10-03 DIAGNOSIS — S2243XA Multiple fractures of ribs, bilateral, initial encounter for closed fracture: Secondary | ICD-10-CM | POA: Diagnosis not present

## 2024-10-03 DIAGNOSIS — J441 Chronic obstructive pulmonary disease with (acute) exacerbation: Secondary | ICD-10-CM | POA: Diagnosis not present

## 2024-10-03 DIAGNOSIS — N179 Acute kidney failure, unspecified: Secondary | ICD-10-CM | POA: Diagnosis not present

## 2024-10-03 DIAGNOSIS — J9611 Chronic respiratory failure with hypoxia: Secondary | ICD-10-CM | POA: Diagnosis not present

## 2024-10-03 DIAGNOSIS — E876 Hypokalemia: Secondary | ICD-10-CM | POA: Diagnosis not present

## 2024-10-03 DIAGNOSIS — Z9981 Dependence on supplemental oxygen: Secondary | ICD-10-CM | POA: Diagnosis not present

## 2024-10-03 DIAGNOSIS — D62 Acute posthemorrhagic anemia: Secondary | ICD-10-CM | POA: Diagnosis not present

## 2024-10-16 DIAGNOSIS — M25562 Pain in left knee: Secondary | ICD-10-CM | POA: Diagnosis not present

## 2024-10-16 DIAGNOSIS — G8929 Other chronic pain: Secondary | ICD-10-CM | POA: Diagnosis not present

## 2024-10-16 DIAGNOSIS — M25512 Pain in left shoulder: Secondary | ICD-10-CM | POA: Diagnosis not present

## 2024-10-16 DIAGNOSIS — J449 Chronic obstructive pulmonary disease, unspecified: Secondary | ICD-10-CM | POA: Diagnosis not present

## 2024-10-16 DIAGNOSIS — M7062 Trochanteric bursitis, left hip: Secondary | ICD-10-CM | POA: Diagnosis not present

## 2024-10-16 DIAGNOSIS — M25511 Pain in right shoulder: Secondary | ICD-10-CM | POA: Diagnosis not present

## 2024-10-16 DIAGNOSIS — M545 Low back pain, unspecified: Secondary | ICD-10-CM | POA: Diagnosis not present

## 2024-10-16 DIAGNOSIS — M25561 Pain in right knee: Secondary | ICD-10-CM | POA: Diagnosis not present

## 2024-10-16 DIAGNOSIS — J9611 Chronic respiratory failure with hypoxia: Secondary | ICD-10-CM | POA: Diagnosis not present

## 2024-10-16 DIAGNOSIS — G894 Chronic pain syndrome: Secondary | ICD-10-CM | POA: Diagnosis not present

## 2024-10-21 ENCOUNTER — Ambulatory Visit (INDEPENDENT_AMBULATORY_CARE_PROVIDER_SITE_OTHER): Admitting: Internal Medicine

## 2024-10-21 VITALS — BP 136/88 | HR 111 | Temp 97.9°F | Ht 63.0 in | Wt 172.8 lb

## 2024-10-21 DIAGNOSIS — J301 Allergic rhinitis due to pollen: Secondary | ICD-10-CM

## 2024-10-21 DIAGNOSIS — E7800 Pure hypercholesterolemia, unspecified: Secondary | ICD-10-CM

## 2024-10-21 DIAGNOSIS — F419 Anxiety disorder, unspecified: Secondary | ICD-10-CM | POA: Diagnosis not present

## 2024-10-21 DIAGNOSIS — Z0130 Encounter for examination of blood pressure without abnormal findings: Secondary | ICD-10-CM

## 2024-10-21 DIAGNOSIS — K5901 Slow transit constipation: Secondary | ICD-10-CM

## 2024-10-21 DIAGNOSIS — K219 Gastro-esophageal reflux disease without esophagitis: Secondary | ICD-10-CM

## 2024-10-21 DIAGNOSIS — F5101 Primary insomnia: Secondary | ICD-10-CM

## 2024-10-21 DIAGNOSIS — J449 Chronic obstructive pulmonary disease, unspecified: Secondary | ICD-10-CM

## 2024-10-21 MED ORDER — LINACLOTIDE 290 MCG PO CAPS: 290.0000 ug | ORAL_CAPSULE | Freq: Every day | ORAL | 2 refills | Status: AC

## 2024-10-21 MED ORDER — FLUTICASONE PROPIONATE 50 MCG/ACT NA SUSP: 1.0000 | Freq: Every day | NASAL | 2 refills | Status: AC

## 2024-10-21 MED ORDER — DONEPEZIL HCL 10 MG PO TABS: 10.0000 mg | ORAL_TABLET | Freq: Every morning | ORAL | 0 refills | Status: AC

## 2024-10-21 MED ORDER — ARNUITY ELLIPTA 100 MCG/ACT IN AEPB: 1.0000 | INHALATION_SPRAY | Freq: Every day | RESPIRATORY_TRACT | 2 refills | Status: AC

## 2024-10-21 MED ORDER — SUCRALFATE 1 G PO TABS: 1.0000 g | ORAL_TABLET | Freq: Four times a day (QID) | ORAL | 0 refills | Status: AC

## 2024-10-21 MED ORDER — BELSOMRA 20 MG PO TABS: 1.0000 | ORAL_TABLET | Freq: Every day | ORAL | 2 refills | Status: AC

## 2024-10-21 MED ORDER — CITALOPRAM HYDROBROMIDE 40 MG PO TABS: 40.0000 mg | ORAL_TABLET | Freq: Every morning | ORAL | 0 refills | Status: AC

## 2024-10-21 MED ORDER — BUSPIRONE HCL 15 MG PO TABS: 15.0000 mg | ORAL_TABLET | Freq: Two times a day (BID) | ORAL | 0 refills | Status: AC

## 2024-10-21 MED ORDER — PANTOPRAZOLE SODIUM 40 MG PO TBEC: 40.0000 mg | DELAYED_RELEASE_TABLET | Freq: Two times a day (BID) | ORAL | 0 refills | Status: AC

## 2024-10-21 MED ORDER — ATORVASTATIN CALCIUM 80 MG PO TABS: 80.0000 mg | ORAL_TABLET | Freq: Every day | ORAL | 0 refills | Status: AC

## 2024-10-21 NOTE — Progress Notes (Signed)
 Established Patient Office Visit  Subjective:  Patient ID: Jean Gomez, female    DOB: [birth year]  Age: 68 y.o. MRN: [id number]  Chief Complaint  Patient presents with   Acute Visit    Right side pain    Hospital f/u after a fall with rib fractures, sent to SNF for further rehab. Still c/o right flank pain.    No other concerns at this time.   Past Medical History:  Diagnosis Date   Acute postoperative pain 01/16/2019   Allergy    Asthma    Cerebral aneurysm    Chest pain 11/01/2016   Chronic migraine    Migraine   Chronic pain syndrome    Chronic respiratory failure (HCC)    Constipation    COPD (chronic obstructive pulmonary disease) (HCC)    Depression    major depression in remission   Hypercholesterolemia    Hypertension    Osteoarthritis    Pneumonia    12/2018   Sarcoid    Seizures (HCC)    Sleep apnea     Past Surgical History:  Procedure Laterality Date   ABDOMINAL HYSTERECTOMY     BACK SURGERY     spine fusion anterior lumbar with open posterior   BIOPSY  07/02/2023   Procedure: BIOPSY;  Surgeon: Maryruth Ole DASEN, MD;  Location: [facility] ENDOSCOPY;  Service: Endoscopy;;   CHOLECYSTECTOMY     COLONOSCOPY WITH PROPOFOL  N/A 08/19/2019   Procedure: COLONOSCOPY WITH PROPOFOL ;  Surgeon: Gaylyn Gladis PENNER, MD;  Location: [facility] ENDOSCOPY;  Service: Endoscopy;  Laterality: N/A;   ESOPHAGOGASTRODUODENOSCOPY (EGD) WITH PROPOFOL  N/A 08/19/2019   Procedure: ESOPHAGOGASTRODUODENOSCOPY (EGD) WITH PROPOFOL ;  Surgeon: Gaylyn Gladis PENNER, MD;  Location: [facility] ENDOSCOPY;  Service: Endoscopy;  Laterality: N/A;   ESOPHAGOGASTRODUODENOSCOPY (EGD) WITH PROPOFOL  N/A 08/04/2020   Procedure: ESOPHAGOGASTRODUODENOSCOPY (EGD) WITH PROPOFOL ;  Surgeon: Dessa Reyes ORN, MD;  Location: [facility] ENDOSCOPY;  Service: Endoscopy;  Laterality: N/A;   ESOPHAGOGASTRODUODENOSCOPY (EGD) WITH PROPOFOL  N/A 11/05/2020   Procedure: ESOPHAGOGASTRODUODENOSCOPY (EGD) WITH PROPOFOL ;  Surgeon: Dessa Reyes ORN, MD;  Location: [facility] ENDOSCOPY;  Service: Endoscopy;  Laterality: N/A;   ESOPHAGOGASTRODUODENOSCOPY (EGD) WITH PROPOFOL  N/A 07/02/2023   Procedure: ESOPHAGOGASTRODUODENOSCOPY (EGD) WITH PROPOFOL ;  Surgeon: Maryruth Ole DASEN, MD;  Location: [facility] ENDOSCOPY;  Service: Endoscopy;  Laterality: N/A;   ESOPHAGOGASTRODUODENOSCOPY (EGD) WITH PROPOFOL  N/A 10/01/2023   Procedure: ESOPHAGOGASTRODUODENOSCOPY (EGD) WITH PROPOFOL ;  Surgeon: Jinny Carmine, MD;  Location: [facility] ENDOSCOPY;  Service: Endoscopy;  Laterality: N/A;   SPINAL FUSION      Social History   Socioeconomic History   Marital status: Single    Spouse name: Not on file   Number of children: Not on file   Years of education: Not on file   Highest education level: Not on file  Occupational History   Not on file  Tobacco Use   Smoking status: Never   Smokeless tobacco: Never  Vaping Use   Vaping status: Never Used  Substance and Sexual Activity   Alcohol use: Yes    Comment: rarely   Drug use: No   Sexual activity: Not on file  Other Topics Concern   Not on file  Social History Narrative   Not on file   Social Drivers of Health   Financial Resource Strain: Low Risk  (07/29/2024)   Received from [facility]   Overall Financial Resource Strain (CARDIA)    Difficulty of Paying Living Expenses: Not hard at all  Food Insecurity: No Food Insecurity (09/13/2024)  Hunger Vital Sign    Worried About Running Out of Food in the Last Year: Never true    Ran Out of Food in the Last Year: Never true  Transportation Needs: No Transportation Needs (09/13/2024)   PRAPARE - Administrator, Civil Service (Medical): No    Lack of Transportation (Non-Medical): No  Physical Activity: Unknown (03/05/2018)   Exercise Vital Sign    Days of Exercise per Week: Patient declined    Minutes of Exercise per Session: Patient declined  Stress: No Stress Concern Present (03/05/2018)   [employer] of  Occupational Health - Occupational Stress Questionnaire    Feeling of Stress : Only a little  Social Connections: Unknown (09/13/2024)   Social Connection and Isolation Panel    Frequency of Communication with Friends and Family: Three times a week    Frequency of Social Gatherings with Friends and Family: Three times a week    Attends Religious Services: Patient declined    Active Member of Clubs or Organizations: Patient declined    Attends Banker Meetings: Patient declined    Marital Status: Patient declined  Intimate Partner Violence: Not At Risk (09/13/2024)   Humiliation, Afraid, Rape, and Kick questionnaire    Fear of Current or Ex-Partner: No    Emotionally Abused: No    Physically Abused: No    Sexually Abused: No    Family History  Problem Relation Age of Onset   Dementia Mother    Breast cancer Mother    Throat cancer Father     Allergies  Allergen Reactions   Penicillins Rash, Hives and Itching   Contrast Media [Iodinated Contrast Media] Swelling   Gabapentin    Labetalol Other (See Comments)    Made hair fall out   Lactose Intolerance (Gi)     Bloating and diarrhea    Sulfabenzamide Nausea Only   Pregabalin  Other (See Comments)    The patient complained of hemifacial numbness with the use of the medication.    Outpatient Medications Prior to Visit  Medication Sig   albuterol  (PROVENTIL ) (2.5 MG/3ML) 0.083% nebulizer solution Take 2.5 mg by nebulization every 6 (six) hours as needed for wheezing or shortness of breath.   Albuterol  Sulfate 108 (90 Base) MCG/ACT AEPB Inhale 2 puffs into the lungs every 6 (six) hours as needed.   amitriptyline (ELAVIL) 50 MG tablet Take 50 mg by mouth at bedtime.   aspirin  EC 81 MG tablet Take 1 tablet (81 mg total) by mouth daily.   calcium  carbonate (OS-CAL) 1250 (500 Ca) MG chewable tablet Chew 1 tablet by mouth daily.   diphenhydrAMINE  (BENADRYL ) 25 MG tablet Take 25 mg by mouth every 6 (six) hours as needed.    Elastic Bandages & Supports (MEDICAL COMPRESSION STOCKINGS) MISC Please provide compression stockings   lactulose (CHRONULAC) 10 GM/15ML solution TAKE (3 TEASPOONFULS) BY MOUTH TWICE DAILY AS NEEDED   levETIRAcetam  (KEPPRA ) 750 MG tablet TAKE 1 TABLET BY MOUTH EVERY MORNING   metoprolol tartrate (LOPRESSOR) 25 MG tablet Take 1 tablet (25 mg total) by mouth 2 (two) times daily.   mirtazapine  (REMERON ) 15 MG tablet TAKE ONE TABLET BY MOUTH AT BEDTIME   nitroGLYCERIN  (NITROSTAT ) 0.4 MG SL tablet Place 0.4 mg under the tongue every 5 (five) minutes as needed for chest pain.   Oxycodone  HCl 10 MG TABS Take 1 tablet (10 mg total) by mouth 3 (three) times daily as needed.   OXYGEN  Inhale 2 L/min into the  lungs continuous.   tiZANidine  (ZANAFLEX ) 4 MG tablet Take 1 tablet (4 mg total) by mouth 3 (three) times daily. (Patient taking differently: Take 4 mg by mouth 3 (three) times daily as needed.)   [DISCONTINUED] atorvastatin  (LIPITOR) 80 MG tablet Take 1 tablet (80 mg total) by mouth at bedtime.   [DISCONTINUED] busPIRone  (BUSPAR ) 15 MG tablet Take 1 tablet (15 mg total) by mouth 2 (two) times daily.   [DISCONTINUED] citalopram  (CELEXA ) 40 MG tablet Take 1 tablet (40 mg total) by mouth every morning.   [DISCONTINUED] donepezil  (ARICEPT ) 10 MG tablet Take 1 tablet (10 mg total) by mouth every morning.   [DISCONTINUED] fluticasone  (FLONASE ) 50 MCG/ACT nasal spray Place 1 spray into both nostrils daily.   [DISCONTINUED] Fluticasone  Furoate (ARNUITY ELLIPTA ) 100 MCG/ACT AEPB TAKE 1 INHALATION BY MOUTH ONCE DAILY   [DISCONTINUED] linaclotide  (LINZESS ) 290 MCG CAPS capsule Take 1 capsule (290 mcg total) by mouth daily before breakfast.   [DISCONTINUED] pantoprazole  (PROTONIX ) 40 MG tablet Take 1 tablet (40 mg total) by mouth 2 (two) times daily.   [DISCONTINUED] sucralfate  (CARAFATE ) 1 g tablet Take 1 tablet (1 g total) by mouth 4 (four) times daily.   [DISCONTINUED] Suvorexant  (BELSOMRA ) 20 MG  TABS Take 1 tablet (20 mg total) by mouth at bedtime.   No facility-administered medications prior to visit.    Review of Systems  Constitutional: Negative.   HENT: Negative.    Eyes: Negative.   Respiratory:  Positive for shortness of breath and wheezing (improved).   Cardiovascular:  Positive for chest pain (pleuritic). Negative for palpitations.  Genitourinary: Negative.   Skin: Negative.   Neurological:  Positive for headaches.  Endo/Heme/Allergies: Negative.   Psychiatric/Behavioral:  The patient has insomnia.        Objective:   BP 136/88   Pulse (!) 111   Temp 97.9 F (36.6 C)   Ht 5' 3 (1.6 m)   Wt 172 lb 12.8 oz (78.4 kg)   SpO2 98%   BMI 30.61 kg/m   Vitals:   10/21/24 1357  BP: 136/88  Pulse: (!) 111  Temp: 97.9 F (36.6 C)  Height: 5' 3 (1.6 m)  Weight: 172 lb 12.8 oz (78.4 kg)  SpO2: 98%  BMI (Calculated): 30.62    Physical Exam Vitals reviewed.  Constitutional:      General: She is not in acute distress. HENT:     Head: Normocephalic.     Nose: Nose normal.     Mouth/Throat:     Mouth: Mucous membranes are moist.  Eyes:     Extraocular Movements: Extraocular movements intact.     Pupils: Pupils are equal, round, and reactive to light.  Cardiovascular:     Rate and Rhythm: Normal rate and regular rhythm.     Heart sounds: No murmur heard. Pulmonary:     Effort: Pulmonary effort is normal. No respiratory distress.     Breath sounds: No rales.     Comments: [place] at 2L/min Abdominal:     General: Abdomen is flat.     Palpations: There is no hepatomegaly, splenomegaly or mass.  Musculoskeletal:        General: Normal range of motion.     Cervical back: Normal range of motion. No tenderness.     Right lower leg: 1+ Pitting Edema present.     Left lower leg: 1+ Pitting Edema present.  Skin:    General: Skin is warm and dry.  Neurological:     General: No focal  deficit present.     Mental Status: She is alert and oriented to person,  place, and time.     Cranial Nerves: No cranial nerve deficit.     Motor: No weakness.  Psychiatric:        Mood and Affect: Mood normal.        Behavior: Behavior normal.      No results found for any visits on 10/21/24.      Assessment & Plan:  Shamiah was seen today for acute visit.  Seasonal allergic rhinitis due to pollen -     Fluticasone  Propionate; Place 1 spray into both nostrils daily.  Dispense: 16 mL; Refill: 2  Hypercholesterolemia -     Atorvastatin  Calcium ; Take 1 tablet (80 mg total) by mouth at bedtime.  Dispense: 90 tablet; Refill: 0 -     Comprehensive metabolic panel with GFR -     Lipid panel  Anxiety -     busPIRone  HCl; Take 1 tablet (15 mg total) by mouth 2 (two) times daily.  Dispense: 180 tablet; Refill: 0 -     Citalopram  Hydrobromide; Take 1 tablet (40 mg total) by mouth every morning.  Dispense: 90 tablet; Refill: 0  Slow transit constipation Overview: Overview:  Theatre Stage Manager of this note might be different from the original. amatiza  Orders: -     linaCLOtide ; Take 1 capsule (290 mcg total) by mouth daily before breakfast.  Dispense: 30 capsule; Refill: 2  Gastroesophageal reflux disease without esophagitis -     Pantoprazole  Sodium; Take 1 tablet (40 mg total) by mouth 2 (two) times daily.  Dispense: 180 tablet; Refill: 0 -     Sucralfate ; Take 1 tablet (1 g total) by mouth 4 (four) times daily.  Dispense: 360 tablet; Refill: 0  Primary insomnia -     Belsomra ; Take 1 tablet (20 mg total) by mouth at bedtime.  Dispense: 30 tablet; Refill: 2  Chronic obstructive pulmonary disease, unspecified COPD type (HCC) Overview: Last Assessment & Plan:  On O2 and inhalers.   Last Assessment & Plan:  Formatting of this note might be different from the original. On O2 and inhalers.  Orders: -     Arnuity Ellipta ; Inhale 1 Inhalation into the lungs daily in the afternoon. TAKE 1 INHALATION BY MOUTH ONCE DAILY  Dispense: 30 each;  Refill: 2  Other orders -     Donepezil  HCl; Take 1 tablet (10 mg total) by mouth every morning.  Dispense: 90 tablet; Refill: 0    Problem List Items Addressed This Visit       Respiratory   COPD (chronic obstructive pulmonary disease) (HCC)   Relevant Medications   fluticasone  (FLONASE ) 50 MCG/ACT nasal spray   Fluticasone  Furoate (ARNUITY ELLIPTA ) 100 MCG/ACT AEPB     Digestive   GERD (gastroesophageal reflux disease)   Relevant Medications   linaclotide  (LINZESS ) 290 MCG CAPS capsule   pantoprazole  (PROTONIX ) 40 MG tablet   sucralfate  (CARAFATE ) 1 g tablet     Other   Constipation   Relevant Medications   linaclotide  (LINZESS ) 290 MCG CAPS capsule   Hypercholesterolemia   Relevant Medications   atorvastatin  (LIPITOR) 80 MG tablet   Other Relevant Orders   Comprehensive metabolic panel with GFR   Lipid panel   Insomnia   Relevant Medications   Suvorexant  (BELSOMRA ) 20 MG TABS   Other Visit Diagnoses       Seasonal allergic rhinitis due to pollen    -  Primary   Relevant Medications   fluticasone  (FLONASE ) 50 MCG/ACT nasal spray     Anxiety       Relevant Medications   busPIRone  (BUSPAR ) 15 MG tablet   citalopram  (CELEXA ) 40 MG tablet       Return in about 3 months (around 01/21/2025) for fu with labs prior.   Total time spent: 20 minutes. This time includes review of previous notes and results and patient face to face interaction during today'Jiselle Sheu visit.    Sherrill Cinderella Perry, MD  10/21/2024   This document may have been prepared by [employer] Voice Recognition software and as such may include unintentional dictation errors.

## 2024-10-27 ENCOUNTER — Other Ambulatory Visit: Payer: Self-pay | Admitting: Internal Medicine

## 2024-10-29 ENCOUNTER — Ambulatory Visit: Admitting: Internal Medicine

## 2024-11-28 ENCOUNTER — Telehealth: Payer: Self-pay

## 2024-11-28 NOTE — Telephone Encounter (Signed)
 Jean Gomez with [facility] called asking for you to sign the death certificate in [place] so the patient can be cremated asap please

## 2024-12-18 DEATH — deceased

## 2025-01-21 ENCOUNTER — Ambulatory Visit: Payer: Self-pay | Admitting: Internal Medicine
